# Patient Record
Sex: Female | Born: 1935 | Race: Black or African American | Hispanic: No | Smoking: Never smoker
Health system: Southern US, Community
[De-identification: ages and names within clinical notes are randomized; demographics above are authoritative.]

## PROBLEM LIST (undated history)

## (undated) DIAGNOSIS — M1990 Unspecified osteoarthritis, unspecified site: Secondary | ICD-10-CM

## (undated) DIAGNOSIS — W19XXXA Unspecified fall, initial encounter: Secondary | ICD-10-CM

## (undated) DIAGNOSIS — I10 Essential (primary) hypertension: Secondary | ICD-10-CM

## (undated) DIAGNOSIS — I4819 Other persistent atrial fibrillation: Secondary | ICD-10-CM

## (undated) DIAGNOSIS — I5032 Chronic diastolic (congestive) heart failure: Secondary | ICD-10-CM

## (undated) DIAGNOSIS — R42 Dizziness and giddiness: Secondary | ICD-10-CM

## (undated) DIAGNOSIS — K922 Gastrointestinal hemorrhage, unspecified: Secondary | ICD-10-CM

## (undated) DIAGNOSIS — I739 Peripheral vascular disease, unspecified: Secondary | ICD-10-CM

## (undated) HISTORY — PX: JOINT REPLACEMENT: SHX530

## (undated) HISTORY — PX: TOTAL KNEE ARTHROPLASTY: SHX125

## (undated) HISTORY — DX: Peripheral vascular disease, unspecified: I73.9

## (undated) HISTORY — PX: HERNIA REPAIR: SHX51

## (undated) HISTORY — PX: ABDOMINAL AORTIC ANEURYSM REPAIR: SUR1152

---

## 2004-01-02 ENCOUNTER — Other Ambulatory Visit: Payer: Self-pay

## 2006-11-22 ENCOUNTER — Ambulatory Visit: Payer: Self-pay | Admitting: Gastroenterology

## 2007-03-28 ENCOUNTER — Ambulatory Visit: Payer: Self-pay | Admitting: Unknown Physician Specialty

## 2007-10-05 ENCOUNTER — Ambulatory Visit: Payer: Self-pay | Admitting: *Deleted

## 2008-08-15 ENCOUNTER — Ambulatory Visit: Payer: Self-pay | Admitting: Surgery

## 2008-09-27 ENCOUNTER — Ambulatory Visit: Payer: Self-pay | Admitting: Family Medicine

## 2008-12-14 HISTORY — PX: LAPAROSCOPIC INCISIONAL / UMBILICAL / VENTRAL HERNIA REPAIR: SUR789

## 2009-06-06 ENCOUNTER — Emergency Department: Payer: Self-pay | Admitting: Emergency Medicine

## 2009-07-08 ENCOUNTER — Ambulatory Visit: Payer: Self-pay | Admitting: Gastroenterology

## 2009-09-16 ENCOUNTER — Ambulatory Visit: Payer: Self-pay | Admitting: Surgery

## 2009-09-24 ENCOUNTER — Inpatient Hospital Stay: Payer: Self-pay | Admitting: Surgery

## 2011-09-28 ENCOUNTER — Encounter: Payer: Self-pay | Admitting: Cardiothoracic Surgery

## 2011-09-28 ENCOUNTER — Encounter: Payer: Self-pay | Admitting: Nurse Practitioner

## 2011-10-15 ENCOUNTER — Encounter: Payer: Self-pay | Admitting: Nurse Practitioner

## 2011-10-15 ENCOUNTER — Encounter: Payer: Self-pay | Admitting: Cardiothoracic Surgery

## 2011-10-19 ENCOUNTER — Ambulatory Visit: Payer: Self-pay | Admitting: Vascular Surgery

## 2011-11-14 ENCOUNTER — Encounter: Payer: Self-pay | Admitting: Cardiothoracic Surgery

## 2011-11-14 ENCOUNTER — Encounter: Payer: Self-pay | Admitting: Nurse Practitioner

## 2012-07-25 LAB — URINALYSIS, COMPLETE
Glucose,UR: NEGATIVE mg/dL (ref 0–75)
Ketone: NEGATIVE
Nitrite: NEGATIVE
WBC UR: 4 /HPF (ref 0–5)

## 2012-07-25 LAB — CBC
HCT: 39.8 % (ref 35.0–47.0)
MCH: 32.1 pg (ref 26.0–34.0)
MCHC: 34.9 g/dL (ref 32.0–36.0)
MCV: 92 fL (ref 80–100)
Platelet: 131 10*3/uL — ABNORMAL LOW (ref 150–440)
RDW: 13.7 % (ref 11.5–14.5)
WBC: 12 10*3/uL — ABNORMAL HIGH (ref 3.6–11.0)

## 2012-07-25 LAB — COMPREHENSIVE METABOLIC PANEL
BUN: 16 mg/dL (ref 7–18)
Bilirubin,Total: 0.5 mg/dL (ref 0.2–1.0)
Calcium, Total: 9.4 mg/dL (ref 8.5–10.1)
Co2: 24 mmol/L (ref 21–32)
EGFR (African American): >60
EGFR (Non-African Amer.): 55 — ABNORMAL LOW
Glucose: 158 mg/dL — ABNORMAL HIGH (ref 65–99)
Osmolality: 284 (ref 275–301)
Potassium: 4.1 mmol/L (ref 3.5–5.1)
SGOT(AST): 39 U/L — ABNORMAL HIGH (ref 15–37)
SGPT (ALT): 26 U/L (ref 12–78)
Sodium: 140 mmol/L (ref 136–145)

## 2012-07-25 LAB — LIPASE, BLOOD: Lipase: 95 U/L (ref 73–393)

## 2012-07-26 ENCOUNTER — Inpatient Hospital Stay: Payer: Self-pay | Admitting: Surgery

## 2012-07-27 LAB — CBC WITH DIFFERENTIAL/PLATELET
Basophil #: 0 10*3/uL (ref 0.0–0.1)
Basophil %: 0.5 %
Eosinophil #: 0.4 10*3/uL (ref 0.0–0.7)
Eosinophil %: 4 %
HCT: 36 % (ref 35.0–47.0)
HGB: 12.2 g/dL (ref 12.0–16.0)
Lymphocyte #: 2.4 10*3/uL (ref 1.0–3.6)
Lymphocyte %: 27.7 %
MCH: 31.1 pg (ref 26.0–34.0)
MCHC: 33.8 g/dL (ref 32.0–36.0)
Monocyte %: 8.2 %
Neutrophil %: 59.6 %
RBC: 3.93 10*6/uL (ref 3.80–5.20)
WBC: 8.8 10*3/uL (ref 3.6–11.0)

## 2012-07-27 LAB — BASIC METABOLIC PANEL
Anion Gap: 7 (ref 7–16)
BUN: 10 mg/dL (ref 7–18)
Chloride: 106 mmol/L (ref 98–107)
Co2: 29 mmol/L (ref 21–32)
EGFR (African American): >60
EGFR (Non-African Amer.): >60
Glucose: 104 mg/dL — ABNORMAL HIGH (ref 65–99)
Osmolality: 282 (ref 275–301)
Potassium: 3.6 mmol/L (ref 3.5–5.1)
Sodium: 142 mmol/L (ref 136–145)

## 2012-07-28 LAB — BASIC METABOLIC PANEL
BUN: 7 mg/dL (ref 7–18)
Chloride: 108 mmol/L — ABNORMAL HIGH (ref 98–107)
Co2: 25 mmol/L (ref 21–32)
Creatinine: 0.71 mg/dL (ref 0.60–1.30)
EGFR (Non-African Amer.): >60
Osmolality: 280 (ref 275–301)
Potassium: 3.9 mmol/L (ref 3.5–5.1)

## 2012-07-28 LAB — CBC WITH DIFFERENTIAL/PLATELET
Basophil %: 0.6 %
Eosinophil #: 0.3 10*3/uL (ref 0.0–0.7)
Eosinophil %: 3.7 %
HCT: 36.8 % (ref 35.0–47.0)
HGB: 12.7 g/dL (ref 12.0–16.0)
Lymphocyte %: 20.9 %
MCH: 32.2 pg (ref 26.0–34.0)
MCHC: 34.6 g/dL (ref 32.0–36.0)
MCV: 93 fL (ref 80–100)
Monocyte #: 0.6 x10 3/mm (ref 0.2–0.9)
Monocyte %: 8 %
Platelet: 63 10*3/uL — ABNORMAL LOW (ref 150–440)
RBC: 3.95 10*6/uL (ref 3.80–5.20)
RDW: 13.8 % (ref 11.5–14.5)
WBC: 7.6 10*3/uL (ref 3.6–11.0)

## 2012-10-26 ENCOUNTER — Ambulatory Visit: Payer: Self-pay | Admitting: Family Medicine

## 2014-10-16 ENCOUNTER — Inpatient Hospital Stay: Payer: Self-pay | Admitting: Surgery

## 2014-10-16 LAB — CBC
HCT: 42.6 % (ref 35.0–47.0)
HGB: 14.3 g/dL (ref 12.0–16.0)
MCH: 31.7 pg (ref 26.0–34.0)
MCHC: 33.6 g/dL (ref 32.0–36.0)
MCV: 94 fL (ref 80–100)
PLATELETS: 131 10*3/uL — AB (ref 150–440)
RBC: 4.52 10*6/uL (ref 3.80–5.20)
RDW: 13.6 % (ref 11.5–14.5)
WBC: 12.9 10*3/uL — AB (ref 3.6–11.0)

## 2014-10-16 LAB — COMPREHENSIVE METABOLIC PANEL
ALBUMIN: 3.5 g/dL (ref 3.4–5.0)
ALK PHOS: 78 U/L
AST: 27 U/L (ref 15–37)
Anion Gap: 9 (ref 7–16)
BUN: 18 mg/dL (ref 7–18)
Bilirubin,Total: 1.5 mg/dL — ABNORMAL HIGH (ref 0.2–1.0)
CREATININE: 1 mg/dL (ref 0.60–1.30)
Calcium, Total: 9 mg/dL (ref 8.5–10.1)
Chloride: 103 mmol/L (ref 98–107)
Co2: 30 mmol/L (ref 21–32)
EGFR (Non-African Amer.): 57 — ABNORMAL LOW
Glucose: 114 mg/dL — ABNORMAL HIGH (ref 65–99)
OSMOLALITY: 286 (ref 275–301)
POTASSIUM: 3.6 mmol/L (ref 3.5–5.1)
SGPT (ALT): 21 U/L
Sodium: 142 mmol/L (ref 136–145)
TOTAL PROTEIN: 6.7 g/dL (ref 6.4–8.2)

## 2014-10-16 LAB — URINALYSIS, COMPLETE
Bilirubin,UR: NEGATIVE
GLUCOSE, UR: NEGATIVE mg/dL (ref 0–75)
Leukocyte Esterase: NEGATIVE
NITRITE: NEGATIVE
Ph: 5 (ref 4.5–8.0)
Protein: 30
SPECIFIC GRAVITY: 1.025 (ref 1.003–1.030)

## 2014-10-17 LAB — CBC WITH DIFFERENTIAL/PLATELET
BASOS PCT: 0.7 %
Basophil #: 0.1 10*3/uL (ref 0.0–0.1)
Eosinophil #: 0.3 10*3/uL (ref 0.0–0.7)
Eosinophil %: 4 %
HCT: 36.3 % (ref 35.0–47.0)
HGB: 12.5 g/dL (ref 12.0–16.0)
Lymphocyte #: 2 10*3/uL (ref 1.0–3.6)
Lymphocyte %: 23.5 %
MCH: 32.3 pg (ref 26.0–34.0)
MCHC: 34.4 g/dL (ref 32.0–36.0)
MCV: 94 fL (ref 80–100)
MONO ABS: 0.7 x10 3/mm (ref 0.2–0.9)
Monocyte %: 8.4 %
NEUTROS ABS: 5.4 10*3/uL (ref 1.4–6.5)
NEUTROS PCT: 63.4 %
Platelet: 113 10*3/uL — ABNORMAL LOW (ref 150–440)
RBC: 3.87 10*6/uL (ref 3.80–5.20)
RDW: 13.8 % (ref 11.5–14.5)
WBC: 8.5 10*3/uL (ref 3.6–11.0)

## 2014-10-17 LAB — BASIC METABOLIC PANEL
Anion Gap: 7 (ref 7–16)
BUN: 16 mg/dL (ref 7–18)
CHLORIDE: 104 mmol/L (ref 98–107)
CO2: 28 mmol/L (ref 21–32)
Calcium, Total: 8 mg/dL — ABNORMAL LOW (ref 8.5–10.1)
Creatinine: 0.77 mg/dL (ref 0.60–1.30)
EGFR (Non-African Amer.): >60
Glucose: 99 mg/dL (ref 65–99)
OSMOLALITY: 279 (ref 275–301)
Potassium: 3.4 mmol/L — ABNORMAL LOW (ref 3.5–5.1)
SODIUM: 139 mmol/L (ref 136–145)

## 2015-04-02 NOTE — H&P (Signed)
PATIENT NAME:  [name], [name] MR#:  [id number] DATE OF BIRTH:  11/13/1931  DATE OF ADMISSION:  07/26/2012  CHIEF COMPLAINT: Abdominal pain.   HISTORY OF PRESENT ILLNESS: This is a patient with approximately 12 to 18 hours of abdominal pain. She points to the periumbilical area stating that it has not worsened but caused her to have nausea and multiple emeses earlier today. She had a normal bowel movement yesterday morning on the 12th, has passed no gas since, however. She has never had an episode like this before. Of note, she has a large recurrent ventral hernia which is reducible and soft but that that is where she points to her pain.   PAST MEDICAL HISTORY: Hypertension.   PAST SURGICAL HISTORY:  1. Knee replacement. 2. Ventral hernia repair. 3. Recurrent ventral hernia with mesh using laparoscopic approach by Dr. Michela PitcherEly in 2010. When asked about why she had the ventral hernia she states she never had surgery before that and thought that it was just due to weakness.  4. In the chart nursing information states she had had an abdominal aortic aneurysm repair. I see no evidence of that and the patient does not mention that in her history nor does she have a long midline incision to suggest that.   ALLERGIES: Sulfa drugs.   MEDICATIONS:  1. Triamterene. 2. Hydrochlorothiazide. 3. Potassium chloride. 4. Labetalol. 5. Amlodipine.  FAMILY HISTORY: Noncontributory.   SOCIAL HISTORY: She does not smoke or drink. Lives at home.   REVIEW OF SYSTEMS: 10 system review is performed and negative with the exception of that mentioned in the history of present illness.   PHYSICAL EXAMINATION:  GENERAL: Healthy, comfortable-appearing black female patient.   VITAL SIGNS: Temperature 97.7, pulse 87, respirations 18, blood pressure 184/78, 96% room air sat, pain scale 6.   HEENT: No scleral icterus.   NECK: No palpable neck nodes.   CHEST: Clear to auscultation.   CARDIAC: Regular rate and rhythm.    ABDOMEN: Soft, nondistended. There is a ventral hernia which is visible and palpable and easily reducible. It is only minimally tender but nearly completely reducible with lots of bowel sounds during the reduction. Multiple laparoscopy scars are noted and an infraumbilical scar is noted.   EXTREMITIES: Without edema. Calves are nontender.   NEUROLOGIC: Grossly intact.   INTEGUMENT: No jaundice.   LABORATORY, DIAGNOSTIC, AND RADIOLOGICAL DATA: CT scan and KUB are personally reviewed. Large recurrent ventral hernia. Multiple tacks present from prior laparoscopic repair. Recurrent hernia has a large amount of bowel within it. There is contrast proximal, none distal, but there is lots of colon gas present with extensive stool.   White blood cell count 12.0, hemoglobin and hematocrit 13.9 and 39.8, platelet count 131. Urinalysis shows 1+ leukocyte esterase and electrolytes are within normal limits. Potassium 4.1.   ASSESSMENT AND PLAN: This is a patient with a partial small bowel obstruction and a large recurrent ventral hernia. I have recommended admission with hydration and NG decompression, re-examination. While this hernia is soft and nontender or only minimally tender, it is mostly reducible and I am not clear that that is the site of her bowel obstruction, however, admission will need to be performed and decompression and re-examination to determine if surgery is indicated if she does not progress. This was discussed with she and her family. They understood and agreed to proceed.   ____________________________ Adah Salvageichard E. Excell Seltzerooper, MD rec:cms D: 07/26/2012 02:36:03 ET T: 07/26/2012 10:00:50 ET JOB#: [id number]  cc: Gerlene Burdockichard  Kerby Nora, MD, <Dictator>  Lattie Haw MD ELECTRONICALLY SIGNED 07/26/2012 19:10

## 2015-04-02 NOTE — Discharge Summary (Signed)
PATIENT NAME:  [name], [name] MR#:  [id number] DATE OF BIRTH:  06/27/1931  DATE OF ADMISSION:  07/26/2012 DATE OF DISCHARGE:  07/29/2012  DIAGNOSES:  1. Recurrent ventral hernia. 2. Small bowel obstruction.  3. Hypertension.   PROCEDURES: None.   HISTORY OF PRESENT ILLNESS/HOSPITAL COURSE: This is a patient who has had several hours of abdominal pain at the time of admission. Work-up had shown a recurrent ventral hernia which was partially reducible in the Emergency Room. She had had some nausea and vomiting and did not pass any gas following a normal bowel movement the day before admission with a diagnosis of probable partial small bowel obstruction possibly related to a recurrent ventral hernia. She was admitted to the hospital. Nasogastric tube was placed. She was resuscitated with IV fluids and her nausea was controlled with antiemetics. While in the hospital her condition improved. Serial x-rays showed passage of gas through to her colon. She started passing gas per rectum and the nasogastric tube was removed. She tolerated a regular diet. Was discharged in stable condition to follow up in our office where Dr. Michela PitcherEly could see the patient. He had performed a recurrent ventral hernia repair in the past laparoscopically and it was likely that she wouldn't require another procedure for that. She understood and agreed with this plan. She was instructed to resume her regular medications. No new medications were sent with her.  ____________________________ Adah Salvageichard E. Excell Seltzerooper, MD rec:cms D: 08/06/2012 19:44:10 ET T: 08/07/2012 12:48:38 ET JOB#: [id number]  cc: Adah Salvageichard E. Excell Seltzerooper, MD, <Dictator> Lattie HawICHARD E Vann Okerlund MD ELECTRONICALLY SIGNED 08/07/2012 19:40

## 2015-04-02 NOTE — H&P (Signed)
Subjective/Chief Complaint abd pain    History of Present Illness 18 hours of abd pain, not worsening. nausea and mult emesis, nml BM this am no flatus since. no prior episode    Past History PMH HTN PSH VH, Rec VH laparascopic with mesh knee repl    Past Medical Health Hypertension   Past Med/Surgical Hx:  htn:   AAA - Abdominal Aortic Aneurysm Repair:   l knee replacement:   hernia repair:   ALLERGIES:  Sulfa drugs: Unknown  Family and Social History:   Family History Non-Contributory    Social History negative tobacco, negative ETOH    Place of Living Home   Review of Systems:   Fever/Chills No    Cough No    Abdominal Pain Yes    Diarrhea No    Constipation Yes    Nausea/Vomiting Yes    SOB/DOE No    Chest Pain No    Dysuria No    Tolerating Diet No  Nauseated  Vomiting   Physical Exam:   GEN no acute distress    HEENT pink conjunctivae    NECK supple    RESP normal resp effort  clear BS  no use of accessory muscles    CARD regular rate    ABD positive tenderness  positive hernia  soft  min tenderness, reducible rec VH, mult scars no peritoneal signs    LYMPH negative neck    EXTR negative edema    SKIN normal to palpation    PSYCH alert, A+O to time, place, person, good insight   Lab Results: Hepatic:  12-Aug-13 20:08    Bilirubin, Total 0.5   Alkaline Phosphatase 115   SGPT (ALT) 26   SGOT (AST)  39   Total Protein, Serum 7.3   Albumin, Serum 3.8  Routine Chem:  12-Aug-13 20:08    Lipase 95 (Result(s) reported on 25 Jul 2012 at 08:45PM.)   Glucose, Serum  158   BUN 16   Creatinine (comp) 0.99   Sodium, Serum 140   Potassium, Serum 4.1   Chloride, Serum 106   CO2, Serum 24   Calcium (Total), Serum 9.4   Osmolality (calc) 284   eGFR (African American) >60   eGFR (Non-African American)  55 (eGFR values <36m/min/1.73 m2 may be an indication of chronic kidney disease (CKD). Calculated eGFR is useful in patients with  stable renal function. The eGFR calculation will not be reliable in acutely ill patients when serum creatinine is changing rapidly. It is not useful in  patients on dialysis. The eGFR calculation may not be applicable to patients at the low and high extremes of body sizes, pregnant women, and vegetarians.)   Anion Gap 10  Routine UA:  12-Aug-13 20:08    Color (UA) Yellow   Clarity (UA) Cloudy   Glucose (UA) Negative   Bilirubin (UA) Negative   Ketones (UA) Negative   Specific Gravity (UA) 1.014   Blood (UA) Negative   pH (UA) 8.0   Protein (UA) Negative   Nitrite (UA) Negative   Leukocyte Esterase (UA) 1+ (Result(s) reported on 25 Jul 2012 at 08:48PM.)   RBC (UA) 4 /HPF   WBC (UA) 4 /HPF   Bacteria (UA) TRACE   Epithelial Cells (UA) <1 /HPF   Amorphous Crystal (UA) PRESENT (Result(s) reported on 25 Jul 2012 at 08:48PM.)  Routine Hem:  12-Aug-13 20:08    WBC (CBC)  12.0   RBC (CBC) 4.33   Hemoglobin (CBC) 13.9  Hematocrit (CBC) 39.8   Platelet Count (CBC)  131 (Result(s) reported on 25 Jul 2012 at 08:29PM.)   MCV 92   MCH 32.1   MCHC 34.9   RDW 13.7     Assessment/Admission Diagnosis sbo with constipation and large but reducible rec VH rec admit, hydrate NG decompression and reexamination   Electronic Signatures: Florene Glen (MD)  (Signed 13-Aug-13 02:30)  Authored: CHIEF COMPLAINT and HISTORY, PAST MEDICAL/SURGIAL HISTORY, ALLERGIES, FAMILY AND SOCIAL HISTORY, REVIEW OF SYSTEMS, PHYSICAL EXAM, LABS, ASSESSMENT AND PLAN   Last Updated: 13-Aug-13 02:30 by Florene Glen (MD)

## 2015-07-13 ENCOUNTER — Emergency Department: Payer: Medicare Other

## 2015-07-13 ENCOUNTER — Emergency Department
Admission: EM | Admit: 2015-07-13 | Discharge: 2015-07-13 | Disposition: A | Discharge status: Home / Self Care | Payer: Medicare Other | Attending: Emergency Medicine | Admitting: Emergency Medicine

## 2015-07-13 ENCOUNTER — Encounter: Payer: Self-pay | Admitting: Emergency Medicine

## 2015-07-13 DIAGNOSIS — I10 Essential (primary) hypertension: Secondary | ICD-10-CM | POA: Insufficient documentation

## 2015-07-13 DIAGNOSIS — K429 Umbilical hernia without obstruction or gangrene: Secondary | ICD-10-CM | POA: Insufficient documentation

## 2015-07-13 DIAGNOSIS — K432 Incisional hernia without obstruction or gangrene: Secondary | ICD-10-CM | POA: Diagnosis not present

## 2015-07-13 DIAGNOSIS — R1011 Right upper quadrant pain: Secondary | ICD-10-CM | POA: Diagnosis present

## 2015-07-13 HISTORY — DX: Essential (primary) hypertension: I10

## 2015-07-13 LAB — COMPREHENSIVE METABOLIC PANEL
ALT: 18 U/L (ref 14–54)
ANION GAP: 8 (ref 5–15)
AST: 25 U/L (ref 15–41)
Albumin: 3.6 g/dL (ref 3.5–5.0)
Alkaline Phosphatase: 86 U/L (ref 38–126)
BILIRUBIN TOTAL: 0.8 mg/dL (ref 0.3–1.2)
BUN: 17 mg/dL (ref 6–20)
CHLORIDE: 106 mmol/L (ref 101–111)
CO2: 25 mmol/L (ref 22–32)
Calcium: 9.2 mg/dL (ref 8.9–10.3)
Creatinine, Ser: 0.78 mg/dL (ref 0.44–1.00)
Glucose, Bld: 85 mg/dL (ref 65–99)
POTASSIUM: 3.7 mmol/L (ref 3.5–5.1)
Sodium: 139 mmol/L (ref 135–145)
Total Protein: 6 g/dL — ABNORMAL LOW (ref 6.5–8.1)

## 2015-07-13 LAB — CBC WITH DIFFERENTIAL/PLATELET
BASOS PCT: 1 %
Basophils Absolute: 0.1 10*3/uL (ref 0–0.1)
Eosinophils Absolute: 0.5 10*3/uL (ref 0–0.7)
Eosinophils Relative: 6 %
HCT: 36.8 % (ref 35.0–47.0)
Hemoglobin: 12.7 g/dL (ref 12.0–16.0)
Lymphocytes Relative: 19 %
Lymphs Abs: 1.5 10*3/uL (ref 1.0–3.6)
MCH: 31.8 pg (ref 26.0–34.0)
MCHC: 34.4 g/dL (ref 32.0–36.0)
MCV: 92.6 fL (ref 80.0–100.0)
MONO ABS: 0.6 10*3/uL (ref 0.2–0.9)
Monocytes Relative: 8 %
NEUTROS PCT: 66 %
Neutro Abs: 4.9 10*3/uL (ref 1.4–6.5)
Platelets: 105 10*3/uL — ABNORMAL LOW (ref 150–440)
RBC: 3.98 MIL/uL (ref 3.80–5.20)
RDW: 13.7 % (ref 11.5–14.5)
WBC: 7.6 10*3/uL (ref 3.6–11.0)

## 2015-07-13 LAB — URINALYSIS COMPLETE WITH MICROSCOPIC (ARMC ONLY)
Bacteria, UA: NONE SEEN
Bilirubin Urine: NEGATIVE
Glucose, UA: NEGATIVE mg/dL
Hgb urine dipstick: NEGATIVE
KETONES UR: NEGATIVE mg/dL
Leukocytes, UA: NEGATIVE
NITRITE: NEGATIVE
PH: 7 (ref 5.0–8.0)
Protein, ur: NEGATIVE mg/dL
RBC / HPF: NONE SEEN RBC/hpf (ref 0–5)
Specific Gravity, Urine: 1.009 (ref 1.005–1.030)

## 2015-07-13 LAB — LIPASE, BLOOD: Lipase: 23 U/L (ref 22–51)

## 2015-07-13 LAB — TROPONIN I

## 2015-07-13 MED ORDER — IOHEXOL 300 MG/ML  SOLN
100.0000 mL | Freq: Once | INTRAMUSCULAR | Status: AC | PRN
  Administered 2015-07-13: 100 mL via INTRAVENOUS

## 2015-07-13 MED ORDER — OXYCODONE-ACETAMINOPHEN 5-325 MG PO TABS
2.0000 | ORAL_TABLET | Freq: Once | ORAL | Status: AC
  Administered 2015-07-13: 2 via ORAL
  Filled 2015-07-13: qty 2

## 2015-07-13 MED ORDER — OXYCODONE-ACETAMINOPHEN 5-325 MG PO TABS: 1.0000 | ORAL_TABLET | Freq: Four times a day (QID) | ORAL | Status: DC | PRN

## 2015-07-13 MED ORDER — ONDANSETRON HCL 4 MG/2ML IJ SOLN
4.0000 mg | Freq: Once | INTRAMUSCULAR | Status: AC
  Administered 2015-07-13: 4 mg via INTRAVENOUS
  Filled 2015-07-13: qty 2

## 2015-07-13 MED ORDER — ONDANSETRON 4 MG PO TBDP: 4.0000 mg | ORAL_TABLET | Freq: Three times a day (TID) | ORAL | Status: DC | PRN

## 2015-07-13 MED ORDER — IOHEXOL 240 MG/ML SOLN
25.0000 mL | Freq: Once | INTRAMUSCULAR | Status: AC | PRN
  Administered 2015-07-13: 25 mL via INTRAVENOUS

## 2015-07-13 MED ORDER — MORPHINE SULFATE 4 MG/ML IJ SOLN
4.0000 mg | Freq: Once | INTRAMUSCULAR | Status: AC
  Administered 2015-07-13: 4 mg via INTRAVENOUS
  Filled 2015-07-13: qty 1

## 2015-07-13 MED ORDER — RANITIDINE HCL 150 MG PO TABS: 150.0000 mg | ORAL_TABLET | Freq: Every day | ORAL | Status: DC

## 2015-07-13 NOTE — Consult Note (Signed)
CC: Periumbilical pain x 1 day post emesis  HPI: [name] is a pleasant 81 yo F with a history of multiple ventral hernia repairs and recurrent hernia who presents with periumbilical pain and emesis.  Emesis began 1 day ago and pain began after.  Currently uncomfortable but without any emesis.  Normal BM yesterday but requires prn miralax to have bm.  No sick contacts, no unusual ingestions.  Was admitted 3 years ago with SBO.  No fevers/chills, night sweats, shortness of breath, cough, chest pain, dysuria/hematuria.  Active Ambulatory Problems    Diagnosis Date Noted  . No Active Ambulatory Problems   Resolved Ambulatory Problems    Diagnosis Date Noted  . No Resolved Ambulatory Problems   Past Medical History  Diagnosis Date  . Hypertension    Past Surgical History  Procedure Laterality Date  . Hernia repair    . Joint replacement      left knee     Medication List    TAKE these medications        ondansetron 4 MG disintegrating tablet  Commonly known as:  ZOFRAN ODT  Take 1 tablet (4 mg total) by mouth every 8 (eight) hours as needed for nausea or vomiting.     oxyCODONE-acetaminophen 5-325 MG per tablet  Commonly known as:  ROXICET  Take 1 tablet by mouth every 6 (six) hours as needed.     ranitidine 150 MG tablet  Commonly known as:  ZANTAC  Take 1 tablet (150 mg total) by mouth at bedtime.       Allergies  Allergen Reactions  . Sulfa Antibiotics Shortness Of Breath   History   Social History  . Marital Status: Divorced    Spouse Name: N/A  . Number of Children: N/A  . Years of Education: N/A   Occupational History  . Not on file.   Social History Main Topics  . Smoking status: Never Smoker   . Smokeless tobacco: Not on file  . Alcohol Use: No  . Drug Use: Not on file  . Sexual Activity: Not on file   Other Topics Concern  . Not on file   Social History Narrative  . No narrative on file   No family history on file.   ROS: Full ROS  obtained, pertinent positives and negatives per HPI  Blood pressure 136/64, pulse 68, temperature 97.8 F (36.6 C), temperature source Oral, resp. rate 18, height  (1.676 m), weight 80.74 kg (178 lb), SpO2 97 %. GEN: NAD/A&Ox3 FACE: no obvious facial trauma, normal external nose, normal external ears EYES: no scleral icterus, no conjunctivitis HEAD: normocephalic atraumatic CV: RRR, no MRG RESP: moving air well, lungs clear ABD: soft, min tender, nondistended EXT: moving all ext well, strength 5/5 NEURO: cnII-XII grossly intact, sensation intact all 4 ext  Labs: Personally reviewed,  Significant for  WBC 7.6 (66%N) BUN 17, Cr .78, Bicarb 25  CT: Personally reviewed, significant for - large periumbilical hernia containing small bowel, no evidence of obstruction or strangulation  [name]  81 yo with recurrent ventral hernia, which she has had for years, who presents with pain and resolved emesis.  Suspect that pain may be due to emesis.  Normal VS, nl WBC, no lab findings of dehydration and normal CT scan.  No acute surgical issues.  Patient may follow up with me or Dr. Michela Pitcher in outpatient clinic to ensure resolution of symptoms.

## 2015-07-13 NOTE — ED Notes (Signed)
Pt states yesterday she had n,v, then immediately after, began having right upper quad pain. Pt states it hurts too bad to sit down at this time.

## 2015-07-13 NOTE — ED Notes (Signed)
Dr. [name] at bedside to discuss with patient results of CT.

## 2015-07-13 NOTE — ED Notes (Signed)
4 attempts for IV access by 2 RN's. Attempts unsuccessful. Pt tolerated well.

## 2015-07-13 NOTE — ED Notes (Signed)
Pt assisted with ambulation to the bathroom. Pt tolerated ambulation but states pain with movement.

## 2015-07-13 NOTE — ED Provider Notes (Signed)
[facility] Emergency Department Provider Note     Time seen: ----------------------------------------- 10:51 AM on 07/13/2015 -----------------------------------------    I have reviewed the triage vital signs and the nursing notes.   HISTORY  Chief Complaint Abdominal Pain    HPI [name] is a 81 y.o. female who presents ER with nausea vomiting and then immediately aftershe began having right upper quadrant and right periumbilical pain. Patient states it hurts to sit down this time, she is not currently nauseated and still has soreness in the periumbilical area. She's had a hernia there and is had operated on at least twice.   Past Medical History  Diagnosis Date  . Hypertension     There are no active problems to display for this patient.   Past Surgical History  Procedure Laterality Date  . Hernia repair    . Joint replacement      left knee    Allergies Sulfa antibiotics  Social History History  Substance Use Topics  . Smoking status: Never Smoker   . Smokeless tobacco: Not on file  . Alcohol Use: No    Review of Systems Constitutional: Negative for fever. Eyes: Negative for visual changes. ENT: Negative for sore throat. Cardiovascular: Negative for chest pain. Respiratory: Negative for shortness of breath. Gastrointestinal: As if her abdominal pain and vomiting Genitourinary: Negative for dysuria. Musculoskeletal: Negative for back pain. Skin: Negative for rash. Neurological: Negative for headaches, focal weakness or numbness.  10-point ROS otherwise negative.  ____________________________________________   PHYSICAL EXAM:  VITAL SIGNS: ED Triage Vitals  Enc Vitals Group     BP 07/13/15 1035 151/82 mmHg     Pulse Rate 07/13/15 1035 96     Resp 07/13/15 1035 18     Temp 07/13/15 1035 97.8 F (36.6 C)     Temp Source 07/13/15 1035 Oral     SpO2 07/13/15 1035 96 %     Weight 07/13/15 1035 178 lb (80.74 kg)      Height 07/13/15 1035 5\' 6"  (1.676 m)     Head Cir --      Peak Flow --      Pain Score 07/13/15 1036 8     Pain Loc --      Pain Edu? --      Excl. in GC? --     Constitutional: Alert and oriented. Well appearing and in no distress. Eyes: Conjunctivae are normal. PERRL. Normal extraocular movements. ENT   Head: Normocephalic and atraumatic.   Nose: No congestion/rhinnorhea.   Mouth/Throat: Mucous membranes are moist.   Neck: No stridor. Cardiovascular: Normal rate, regular rhythm. Normal and symmetric distal pulses are present in all extremities. No murmurs, rubs, or gallops. Respiratory: Normal respiratory effort without tachypnea nor retractions. Breath sounds are clear and equal bilaterally. No wheezes/rales/rhonchi. Gastrointestinal: Soft with right upper quadrant and right periumbilical tenderness. There is a palpable mass likely periumbilical herniation. Musculoskeletal: Nontender with normal range of motion in all extremities. No joint effusions.  No lower extremity tenderness nor edema. Neurologic:  Normal speech and language. No gross focal neurologic deficits are appreciated. Speech is normal. No gait instability. Skin:  Skin is warm, dry and intact. No rash noted. Psychiatric: Mood and affect are normal. Speech and behavior are normal. Patient exhibits appropriate insight and judgment. ____________________________________________  ED COURSE:  Pertinent labs & imaging results that were available during my care of the patient were reviewed by me and considered in my medical decision making (see chart  for details). Patient will need CT imaging of the abdomen. She has a palpable hernia with possible mass in the periumbilical area ____________________________________________    LABS (pertinent positives/negatives)  Labs Reviewed  CBC WITH DIFFERENTIAL/PLATELET - Abnormal; Notable for the following:    Platelets 105 (*)    All other components within normal  limits  COMPREHENSIVE METABOLIC PANEL - Abnormal; Notable for the following:    Total Protein 6.0 (*)    All other components within normal limits  URINALYSIS COMPLETEWITH MICROSCOPIC ([facility] ONLY) - Abnormal; Notable for the following:    Color, Urine YELLOW (*)    APPearance CLEAR (*)    Squamous Epithelial / LPF 0-5 (*)    All other components within normal limits  LIPASE, BLOOD  TROPONIN I    RADIOLOGY Images were viewed by me  CT abdomen and pelvis with contrast  IMPRESSION: Large paraumbilical hernia containing numerous small bowel loops. Small amount of fluid and mesenteric edema within the hernia shows no significant change compared to previous exam. No evidence of bowel obstruction.  Colonic diverticulosis. No radiographic evidence of diverticulitis.  Hepatic cirrhosis. No radiographic evidence hepatic neoplasm, splenomegaly, or ascites.  ____________________________________________  FINAL ASSESSMENT AND PLAN  Abdominal pain, vomiting, periumbilical hernia    Plan: Patient with labs and imaging as dictated above. Patient with a chronic ventral periumbilical hernia, which does not show any change from prior. Labs are grossly unremarkable. She is stable for follow-up with her doctor with pain medicine or antiemetics as needed.   Emily Filbert, MD   Emily Filbert, MD 07/13/15 [phone]

## 2015-07-13 NOTE — Discharge Instructions (Signed)

## 2015-07-13 NOTE — ED Notes (Signed)
Pt's daughter requesting to see EDP.

## 2015-07-15 ENCOUNTER — Emergency Department: Payer: Medicare Other

## 2015-07-15 ENCOUNTER — Other Ambulatory Visit: Payer: Self-pay

## 2015-07-15 ENCOUNTER — Emergency Department
Admission: EM | Admit: 2015-07-15 | Discharge: 2015-07-15 | Disposition: A | Discharge status: Home / Self Care | Payer: Medicare Other | Attending: Student | Admitting: Student

## 2015-07-15 ENCOUNTER — Encounter: Payer: Self-pay | Admitting: Emergency Medicine

## 2015-07-15 DIAGNOSIS — R1011 Right upper quadrant pain: Secondary | ICD-10-CM | POA: Diagnosis present

## 2015-07-15 DIAGNOSIS — Z79899 Other long term (current) drug therapy: Secondary | ICD-10-CM | POA: Insufficient documentation

## 2015-07-15 DIAGNOSIS — K469 Unspecified abdominal hernia without obstruction or gangrene: Secondary | ICD-10-CM | POA: Insufficient documentation

## 2015-07-15 DIAGNOSIS — K5900 Constipation, unspecified: Secondary | ICD-10-CM | POA: Diagnosis not present

## 2015-07-15 DIAGNOSIS — I10 Essential (primary) hypertension: Secondary | ICD-10-CM | POA: Insufficient documentation

## 2015-07-15 LAB — COMPREHENSIVE METABOLIC PANEL
ALT: 16 U/L (ref 14–54)
AST: 24 U/L (ref 15–41)
Albumin: 4.1 g/dL (ref 3.5–5.0)
Alkaline Phosphatase: 89 U/L (ref 38–126)
Anion gap: 8 (ref 5–15)
BILIRUBIN TOTAL: 0.9 mg/dL (ref 0.3–1.2)
BUN: 16 mg/dL (ref 6–20)
CO2: 30 mmol/L (ref 22–32)
Calcium: 9.7 mg/dL (ref 8.9–10.3)
Chloride: 102 mmol/L (ref 101–111)
Creatinine, Ser: 0.93 mg/dL (ref 0.44–1.00)
GFR calc Af Amer: >60 mL/min (ref >60)
GFR calc non Af Amer: 56 mL/min — ABNORMAL LOW (ref >60)
Glucose, Bld: 112 mg/dL — ABNORMAL HIGH (ref 65–99)
Potassium: 4.5 mmol/L (ref 3.5–5.1)
SODIUM: 140 mmol/L (ref 135–145)
Total Protein: 6.8 g/dL (ref 6.5–8.1)

## 2015-07-15 LAB — URINALYSIS COMPLETE WITH MICROSCOPIC (ARMC ONLY)
Bilirubin Urine: NEGATIVE
Glucose, UA: NEGATIVE mg/dL
Hgb urine dipstick: NEGATIVE
Ketones, ur: NEGATIVE mg/dL
Nitrite: NEGATIVE
PROTEIN: NEGATIVE mg/dL
SPECIFIC GRAVITY, URINE: 1.016 (ref 1.005–1.030)
pH: 7 (ref 5.0–8.0)

## 2015-07-15 LAB — CBC
HEMATOCRIT: 40.1 % (ref 35.0–47.0)
Hemoglobin: 13.8 g/dL (ref 12.0–16.0)
MCH: 32 pg (ref 26.0–34.0)
MCHC: 34.4 g/dL (ref 32.0–36.0)
MCV: 92.9 fL (ref 80.0–100.0)
Platelets: 114 10*3/uL — ABNORMAL LOW (ref 150–440)
RBC: 4.31 MIL/uL (ref 3.80–5.20)
RDW: 13.6 % (ref 11.5–14.5)
WBC: 9.2 10*3/uL (ref 3.6–11.0)

## 2015-07-15 LAB — LIPASE, BLOOD: Lipase: 14 U/L — ABNORMAL LOW (ref 22–51)

## 2015-07-15 MED ORDER — IOHEXOL 300 MG/ML  SOLN
100.0000 mL | Freq: Once | INTRAMUSCULAR | Status: AC | PRN
  Administered 2015-07-15: 100 mL via INTRAVENOUS

## 2015-07-15 MED ORDER — IOHEXOL 240 MG/ML SOLN
25.0000 mL | Freq: Once | INTRAMUSCULAR | Status: AC | PRN
  Administered 2015-07-15: 25 mL via ORAL

## 2015-07-15 MED ORDER — MORPHINE SULFATE 4 MG/ML IJ SOLN
4.0000 mg | Freq: Once | INTRAMUSCULAR | Status: AC
  Administered 2015-07-15: 4 mg via INTRAVENOUS
  Filled 2015-07-15: qty 1

## 2015-07-15 MED ORDER — ONDANSETRON HCL 4 MG/2ML IJ SOLN
4.0000 mg | Freq: Once | INTRAMUSCULAR | Status: AC
  Administered 2015-07-15: 4 mg via INTRAVENOUS

## 2015-07-15 MED ORDER — ONDANSETRON HCL 4 MG/2ML IJ SOLN
INTRAMUSCULAR | Status: AC
  Filled 2015-07-15: qty 2

## 2015-07-15 MED ORDER — KETOROLAC TROMETHAMINE 30 MG/ML IJ SOLN
30.0000 mg | Freq: Once | INTRAMUSCULAR | Status: AC
  Administered 2015-07-15: 30 mg via INTRAVENOUS
  Filled 2015-07-15: qty 1

## 2015-07-15 MED ORDER — GI COCKTAIL ~~LOC~~
30.0000 mL | Freq: Once | ORAL | Status: AC
  Administered 2015-07-15: 30 mL via ORAL
  Filled 2015-07-15: qty 30

## 2015-07-15 NOTE — ED Notes (Signed)
Pt just returned to room from x-ray

## 2015-07-15 NOTE — ED Provider Notes (Signed)
[facility] Emergency Department Provider Note  ____________________________________________  Time seen: Approximately 7:52 AM  I have reviewed the triage vital signs and the nursing notes.   HISTORY  Chief Complaint Abdominal Pain    HPI [name] is a 81 y.o. female with history of hypertension and hernia repair who presents for evaluation of continued right upper quadrant abdominal pain, sudden onset 3 days ago, constant since onset. Patient reports that the days ago she suddenly had onset of nonbloody nonbilious emesis followed by right upper quadrant abdominal pain. She reports that she has not vomited for the past 2 days but her pain persists. She is unable to describe the nature of the pain stating it just "hurts" and is worse with movement and palpation. No fevers, and no diarrhea. She continues to have bowel movements but has chronic constipation. Current severity of symptoms is moderate. She was seen in this emergency department on 07/13/2015 with CT of the abdomen and pelvis showing large periumbilical hernia with numerous small bowel loops and a small amount of fluid with mesenteric edema which was unchanged from prior. She was seen by Dr. [name] of general surgery who attributed her pain to her vomiting. She was discharged with Percocet and hasn't taken his however reports that her pain persists. No chest pain or difficulty breathing.    Past Medical History  Diagnosis Date  . Hypertension     There are no active problems to display for this patient.   Past Surgical History  Procedure Laterality Date  . Hernia repair    . Joint replacement      left knee    Current Outpatient Rx  Name  Route  Sig  Dispense  Refill  . amLODipine (NORVASC) 10 MG tablet   Oral   Take 1 tablet by mouth daily.      5   . labetalol (NORMODYNE) 100 MG tablet   Oral   Take 1 tablet by mouth 2 (two) times daily.      5   . oxyCODONE-acetaminophen  (ROXICET) 5-325 MG per tablet   Oral   Take 1 tablet by mouth every 6 (six) hours as needed.   20 tablet   0   . potassium chloride (K-DUR) 10 MEQ tablet   Oral   Take 1 tablet by mouth daily.      1   . triamterene-hydrochlorothiazide (MAXZIDE-25) 37.5-25 MG per tablet   Oral   Take 1 tablet by mouth daily.      5   . ondansetron (ZOFRAN ODT) 4 MG disintegrating tablet   Oral   Take 1 tablet (4 mg total) by mouth every 8 (eight) hours as needed for nausea or vomiting.   20 tablet   0   . ranitidine (ZANTAC) 150 MG tablet   Oral   Take 1 tablet (150 mg total) by mouth at bedtime.   30 tablet   1     Allergies Sulfa antibiotics  No family history on file.  Social History History  Substance Use Topics  . Smoking status: Never Smoker   . Smokeless tobacco: Not on file  . Alcohol Use: No    Review of Systems Constitutional: No fever/chills Eyes: No visual changes. ENT: No sore throat. Cardiovascular: Denies chest pain. Respiratory: Denies shortness of breath. Gastrointestinal: + abdominal pain.  + resolved nausea and vomiting.  No diarrhea.  Chronic constipation. Genitourinary: Negative for dysuria. Musculoskeletal: Negative for back pain. Skin: Negative for rash. Neurological:  Negative for headaches, focal weakness or numbness.  10-point ROS otherwise negative.  ____________________________________________   PHYSICAL EXAM:  VITAL SIGNS: ED Triage Vitals  Enc Vitals Group     BP 07/15/15 0710 156/59 mmHg     Pulse Rate 07/15/15 0710 62     Resp 07/15/15 0710 18     Temp 07/15/15 0710 97.6 F (36.4 C)     Temp Source 07/15/15 0710 Oral     SpO2 07/15/15 0710 98 %     Weight 07/15/15 0710 180 lb (81.647 kg)     Height 07/15/15 0710 5\' 6"  (1.676 m)     Head Cir --      Peak Flow --      Pain Score 07/15/15 0711 10     Pain Loc --      Pain Edu? --      Excl. in GC? --     Constitutional: Alert and oriented. Well appearing and in no acute  distress. Eyes: Conjunctivae are normal. PERRL. EOMI. Head: Atraumatic. Nose: No congestion/rhinnorhea. Mouth/Throat: Mucous membranes are moist.  Oropharynx non-erythematous. Neck: No stridor.  Cardiovascular: Normal rate, regular rhythm. Grossly normal heart sounds.  Good peripheral circulation. Respiratory: Normal respiratory effort.  No retractions. Lungs CTAB. Gastrointestinal: Soft with moderate right upper quadrant tenderness to palpation, palpable mass associated with the umbilicus/periumbilical region consistent with known hernia but no tenderness associated with hernia. No distention.  No CVA tenderness. Genitourinary: deferred Musculoskeletal: No lower extremity tenderness nor edema.  No joint effusions. Neurologic:  Normal speech and language. No gross focal neurologic deficits are appreciated. No gait instability. Skin:  Skin is warm, dry and intact. No rash noted. Psychiatric: Mood and affect are normal. Speech and behavior are normal.  ____________________________________________   LABS (all labs ordered are listed, but only abnormal results are displayed)  Labs Reviewed  LIPASE, BLOOD - Abnormal; Notable for the following:    Lipase 14 (*)    All other components within normal limits  COMPREHENSIVE METABOLIC PANEL - Abnormal; Notable for the following:    Glucose, Bld 112 (*)    GFR calc non Af Amer 56 (*)    All other components within normal limits  CBC - Abnormal; Notable for the following:    Platelets 114 (*)    All other components within normal limits  URINALYSIS COMPLETEWITH MICROSCOPIC ([facility] ONLY) - Abnormal; Notable for the following:    Color, Urine YELLOW (*)    APPearance CLOUDY (*)    Leukocytes, UA TRACE (*)    Bacteria, UA MANY (*)    Squamous Epithelial / LPF 6-30 (*)    All other components within normal limits   ____________________________________________  EKG  ED ECG REPORT I, Gayla Doss, the attending physician, personally viewed  and interpreted this ECG.   Date: 07/15/2015  EKG Time: 12:24  Rate: 52  Rhythm: normal sinus rhythm  Axis: normal  Intervals:none  ST&T Change: No acute ST segment elevation  ____________________________________________  RADIOLOGY  Acute abdominal series IMPRESSION: 1. Mild subsegmental atelectasis at the left lung base. 2. Findings compatible with a partial mid to distal small bowel obstruction. There is no evidence of perforation.   CT abdomen and pelvis IMPRESSION: 1. Again noted large paraumbilical hernia containing small bowel loops without evidence of acute complication or small bowel obstruction. Trace fluid within inferior aspect of the hernia. Mild mesenteric edema within hernia again noted. 2. Colonic diverticula are noted distal left colon and sigmoid colon. No evidence  of acute diverticulitis. 3. Again noted cirrhosis of the liver. Stable collateral veins in right paracolic gutter. 4. No ascites or free air 5. Again noted extensive atherosclerotic vascular calcifications. 6. Unremarkable urinary bladder. 7. Degenerative changes lumbar spine.  ____________________________________________   PROCEDURES  Procedure(s) performed: None  Critical Care performed: No  ____________________________________________   INITIAL IMPRESSION / ASSESSMENT AND PLAN / ED COURSE  Pertinent labs & imaging results that were available during my care of the patient were reviewed by me and considered in my medical decision making (see chart for details).  [name] is a 81 y.o. female with history of hypertension and hernia repair who presents for evaluation of continued right upper quadrant abdominal pain, sudden onset 3 days ago, constant since onset. On exam, she is generally well-appearing and in no acute distress. Vital signs stable, she is afebrile. She has moderate tenderness to palpation in the right upper quadrant and known nontender umbilical hernia. She was CT  scan 2 days ago showing chronic findings associated with her hernia, normal gallbladder. Her pain is not worsened. She has no new vomiting, no diarrhea, no chills. Labs reviewed and are generally unremarkable including normal lipase, normal LFTs, no leukocytosis. She has chronicstable thrombocytopenia on chart review.Plan for pain control, acute abdominal series.  ----------------------------------------- 12:17 PM on 07/15/2015 ----------------------------------------- UA contaminated with multiple squames and patient has no urinary complaints so will not treat. Urine culture sent.Findings on acute abdominal plain films were concerning for possible small bowel obstruction however repeat CT of the abdomen and pelvis today shows no evidence of bowel obstruction, chronic findings which are unchanged since most recent CT scan. Patient with significant improvement in her pain. She has tolerated by mouth intake. Discussed continued symptomatic management, return precautions, need for PCP follow-up and prn surgical follow-up for hernia. Patient and her family at bedside are comfortable with the discharge plan. ____________________________________________   FINAL CLINICAL IMPRESSION(S) / ED DIAGNOSES  Final diagnoses:  RUQ abdominal pain      Gayla Doss, MD 07/15/15 1232

## 2015-07-15 NOTE — ED Notes (Signed)
Pt reports RUQ pain since Saturday, was seen here Saturday and d/c. Pt reports continued pain. Pt reports nausea, denies vomiting or diarrhea.

## 2015-07-17 LAB — URINE CULTURE

## 2015-07-19 ENCOUNTER — Ambulatory Visit (INDEPENDENT_AMBULATORY_CARE_PROVIDER_SITE_OTHER): Payer: Medicare Other | Admitting: Surgery

## 2015-07-19 ENCOUNTER — Encounter: Payer: Self-pay | Admitting: Surgery

## 2015-07-19 VITALS — Temp 97.9°F | Ht 64.0 in | Wt 177.0 lb

## 2015-07-19 DIAGNOSIS — R1011 Right upper quadrant pain: Secondary | ICD-10-CM | POA: Diagnosis not present

## 2015-07-19 MED ORDER — OXYCODONE-ACETAMINOPHEN 5-325 MG PO TABS: 1.0000 | ORAL_TABLET | Freq: Four times a day (QID) | ORAL | Status: DC | PRN

## 2015-07-19 NOTE — Patient Instructions (Addendum)
Take a laxative once a day to help you with your constipation.  Give [place] a call if your pain gets worse before coming to the office in two weeks.  Potential Laxatives to try: miralax Dulcolax Senokot Senokot S

## 2015-07-19 NOTE — Progress Notes (Signed)
CC: Follow up for RUQ pain.    HPI: [name] is a pleasant 81 yo F with a history of recurrent abdominal hernia who presented last week with acute onset RUQ pain.  Began acutely following emesis.  Otherwise doing well.  Tolerating diet.  C/o constipation which is only resolved with miralax, which she does not want to take daily.  Multiple CT scans show no obvious etiology for pain.  No fevers/chills, night sweats, shortness of breath, cough, chest pain, nausea/vomiting, dysuria/hematuria.  Temperature 97.9 F (36.6 C), temperature source Oral, height  (1.626 m), weight 80.287 kg (177 lb). GEN: NAD/A&Ox3 ABD: Soft, min tender, nondistended, large unreducible and nontender hernia  Labs: Personally reviewed, unimpressive Imaging: Personally reviewed, unimpressive  [name] 81 yo with RUQ pain after emesis.  I feel that this is likely musculoskeletal pain due to emesis but has difficulty with BM.  As constipation can cause pain and that this is the easiest to treat, I have told her to take a daily laxative.  I have also suggested RUQ ultrasound to rule out cholelithiasis, but an gallbladder surgery would be difficult with her large hernia.  F/u in 2 weeks to see if improvement.

## 2015-07-29 ENCOUNTER — Ambulatory Visit: Payer: Self-pay | Admitting: Surgery

## 2015-08-02 ENCOUNTER — Emergency Department: Payer: Medicare Other

## 2015-08-02 ENCOUNTER — Emergency Department
Admission: EM | Admit: 2015-08-02 | Discharge: 2015-08-02 | Disposition: A | Discharge status: Home / Self Care | Payer: Medicare Other | Attending: Emergency Medicine | Admitting: Emergency Medicine

## 2015-08-02 ENCOUNTER — Other Ambulatory Visit: Payer: Self-pay

## 2015-08-02 ENCOUNTER — Encounter: Payer: Self-pay | Admitting: Emergency Medicine

## 2015-08-02 DIAGNOSIS — Z79899 Other long term (current) drug therapy: Secondary | ICD-10-CM | POA: Insufficient documentation

## 2015-08-02 DIAGNOSIS — R42 Dizziness and giddiness: Secondary | ICD-10-CM | POA: Insufficient documentation

## 2015-08-02 DIAGNOSIS — I10 Essential (primary) hypertension: Secondary | ICD-10-CM | POA: Insufficient documentation

## 2015-08-02 LAB — CBC
HCT: 40.4 % (ref 35.0–47.0)
HEMOGLOBIN: 13.8 g/dL (ref 12.0–16.0)
MCH: 31.6 pg (ref 26.0–34.0)
MCHC: 34.3 g/dL (ref 32.0–36.0)
MCV: 92.1 fL (ref 80.0–100.0)
PLATELETS: 129 10*3/uL — AB (ref 150–440)
RBC: 4.38 MIL/uL (ref 3.80–5.20)
RDW: 13.4 % (ref 11.5–14.5)
WBC: 9.1 10*3/uL (ref 3.6–11.0)

## 2015-08-02 LAB — URINALYSIS COMPLETE WITH MICROSCOPIC (ARMC ONLY)
BILIRUBIN URINE: NEGATIVE
Glucose, UA: NEGATIVE mg/dL
HGB URINE DIPSTICK: NEGATIVE
KETONES UR: NEGATIVE mg/dL
LEUKOCYTES UA: NEGATIVE
Nitrite: NEGATIVE
PH: 7 (ref 5.0–8.0)
PROTEIN: NEGATIVE mg/dL
Specific Gravity, Urine: 1.006 (ref 1.005–1.030)

## 2015-08-02 LAB — COMPREHENSIVE METABOLIC PANEL
ALBUMIN: 4 g/dL (ref 3.5–5.0)
ALT: 17 U/L (ref 14–54)
ANION GAP: 10 (ref 5–15)
AST: 32 U/L (ref 15–41)
Alkaline Phosphatase: 72 U/L (ref 38–126)
BILIRUBIN TOTAL: 1.1 mg/dL (ref 0.3–1.2)
BUN: 23 mg/dL — ABNORMAL HIGH (ref 6–20)
CHLORIDE: 101 mmol/L (ref 101–111)
CO2: 28 mmol/L (ref 22–32)
Calcium: 9.8 mg/dL (ref 8.9–10.3)
Creatinine, Ser: 1.18 mg/dL — ABNORMAL HIGH (ref 0.44–1.00)
GFR calc Af Amer: 49 mL/min — ABNORMAL LOW (ref >60)
GFR, EST NON AFRICAN AMERICAN: 42 mL/min — AB (ref >60)
Glucose, Bld: 117 mg/dL — ABNORMAL HIGH (ref 65–99)
POTASSIUM: 4 mmol/L (ref 3.5–5.1)
Sodium: 139 mmol/L (ref 135–145)
TOTAL PROTEIN: 6.7 g/dL (ref 6.5–8.1)

## 2015-08-02 LAB — GLUCOSE, CAPILLARY: GLUCOSE-CAPILLARY: 104 mg/dL — AB (ref 65–99)

## 2015-08-02 LAB — TROPONIN I: Troponin I: <0.03 ng/mL (ref <0.031)

## 2015-08-02 MED ORDER — MECLIZINE HCL 25 MG PO TABS
25.0000 mg | ORAL_TABLET | Freq: Once | ORAL | Status: AC
  Administered 2015-08-02: 25 mg via ORAL
  Filled 2015-08-02: qty 1

## 2015-08-02 MED ORDER — MECLIZINE HCL 25 MG PO TABS: 25.0000 mg | ORAL_TABLET | Freq: Three times a day (TID) | ORAL | Status: DC | PRN

## 2015-08-02 NOTE — Discharge Instructions (Signed)
We believe your symptoms were caused by benign vertigo.  Please read through the included information and take any prescribed medication(s).  Follow up with your doctor as listed above.  Your blood work, head CT, and urinalysis today were all reassuring.  If you develop any new or worsening symptoms that concern you, including but not limited to persistent dizziness/vertigo, numbness or weakness in your arms or legs, altered mental status, persistent vomiting, or fever greater than 101, please return immediately to the Emergency Department.   Dizziness Dizziness is a common problem. It is a feeling of unsteadiness or light-headedness. You may feel like you are about to faint. Dizziness can lead to injury if you stumble or fall. A person of any age group can suffer from dizziness, but dizziness is more common in older adults. CAUSES  Dizziness can be caused by many different things, including:  Middle ear problems.  Standing for too long.  Infections.  An allergic reaction.  Aging.  An emotional response to something, such as the sight of blood.  Side effects of medicines.  Tiredness.  Problems with circulation or blood pressure.  Excessive use of alcohol or medicines, or illegal drug use.  Breathing too fast (hyperventilation).  An irregular heart rhythm (arrhythmia).  A low red blood cell count (anemia).  Pregnancy.  Vomiting, diarrhea, fever, or other illnesses that cause body fluid loss (dehydration).  Diseases or conditions such as Parkinson's disease, high blood pressure (hypertension), diabetes, and thyroid problems.  Exposure to extreme heat. DIAGNOSIS  Your health care provider will ask about your symptoms, perform a physical exam, and perform an electrocardiogram (ECG) to record the electrical activity of your heart. Your health care provider may also perform other heart or blood tests to determine the cause of your dizziness. These may include:  Transthoracic  echocardiogram (TTE). During echocardiography, sound waves are used to evaluate how blood flows through your heart.  Transesophageal echocardiogram (TEE).  Cardiac monitoring. This allows your health care provider to monitor your heart rate and rhythm in real time.  Holter monitor. This is a portable device that records your heartbeat and can help diagnose heart arrhythmias. It allows your health care provider to track your heart activity for several days if needed.  Stress tests by exercise or by giving medicine that makes the heart beat faster. TREATMENT  Treatment of dizziness depends on the cause of your symptoms and can vary greatly. HOME CARE INSTRUCTIONS   Drink enough fluids to keep your urine clear or pale yellow. This is especially important in very hot weather. In older adults, it is also important in cold weather.  Take your medicine exactly as directed if your dizziness is caused by medicines. When taking blood pressure medicines, it is especially important to get up slowly.  Rise slowly from chairs and steady yourself until you feel okay.  In the morning, first sit up on the side of the bed. When you feel okay, stand slowly while holding onto something until you know your balance is fine.  Move your legs often if you need to stand in one place for a long time. Tighten and relax your muscles in your legs while standing.  Have someone stay with you for 1-2 days if dizziness continues to be a problem. Do this until you feel you are well enough to stay alone. Have the person call your health care provider if he or she notices changes in you that are concerning.  Do not drive or use heavy  machinery if you feel dizzy.  Do not drink alcohol. SEEK IMMEDIATE MEDICAL CARE IF:   Your dizziness or light-headedness gets worse.  You feel nauseous or vomit.  You have problems talking, walking, or using your arms, hands, or legs.  You feel weak.  You are not thinking clearly or  you have trouble forming sentences. It may take a friend or family member to notice this.  You have chest pain, abdominal pain, shortness of breath, or sweating.  Your vision changes.  You notice any bleeding.  You have side effects from medicine that seems to be getting worse rather than better. MAKE SURE YOU:   Understand these instructions.  Will watch your condition.  Will get help right away if you are not doing well or get worse. Document Released: 05/26/2001 Document Revised: 12/05/2013 Document Reviewed: 06/19/2011 [facility] Patient Information 2015 [place], [place]. This information is not intended to replace advice given to you by your health care provider. Make sure you discuss any questions you have with your health care provider.  Benign Positional Vertigo Vertigo means you feel like you or your surroundings are moving when they are not. Benign positional vertigo is the most common form of vertigo. Benign means that the cause of your condition is not serious. Benign positional vertigo is more common in older adults. CAUSES  Benign positional vertigo is the result of an upset in the labyrinth system. This is an area in the middle ear that helps control your balance. This may be caused by a viral infection, head injury, or repetitive motion. However, often no specific cause is found. SYMPTOMS  Symptoms of benign positional vertigo occur when you move your head or eyes in different directions. Some of the symptoms may include:  Loss of balance and falls.  Vomiting.  Blurred vision.  Dizziness.  Nausea.  Involuntary eye movements (nystagmus). DIAGNOSIS  Benign positional vertigo is usually diagnosed by physical exam. If the specific cause of your benign positional vertigo is unknown, your caregiver may perform imaging tests, such as magnetic resonance imaging (MRI) or computed tomography (CT). TREATMENT  Your caregiver may recommend movements or procedures to correct  the benign positional vertigo. Medicines such as meclizine, benzodiazepines, and medicines for nausea may be used to treat your symptoms. In rare cases, if your symptoms are caused by certain conditions that affect the inner ear, you may need surgery. HOME CARE INSTRUCTIONS   Follow your caregiver's instructions.  Move slowly. Do not make sudden body or head movements.  Avoid driving.  Avoid operating heavy machinery.  Avoid performing any tasks that would be dangerous to you or others during a vertigo episode.  Drink enough fluids to keep your urine clear or pale yellow. SEEK IMMEDIATE MEDICAL CARE IF:   You develop problems with walking, weakness, numbness, or using your arms, hands, or legs.  You have difficulty speaking.  You develop severe headaches.  Your nausea or vomiting continues or gets worse.  You develop visual changes.  Your family or friends notice any behavioral changes.  Your condition gets worse.  You have a fever.  You develop a stiff neck or sensitivity to light. MAKE SURE YOU:   Understand these instructions.  Will watch your condition.  Will get help right away if you are not doing well or get worse. Document Released: 09/07/2006 Document Revised: 02/22/2012 Document Reviewed: 08/20/2011 [facility] Patient Information 2015 [place], [place]. This information is not intended to replace advice given to you by your health care provider.  Make sure you discuss any questions you have with your health care provider. ° °

## 2015-08-02 NOTE — ED Provider Notes (Signed)
-----------------------------------------   7:00 AM on 08/02/2015 -----------------------------------------   Blood pressure 145/56, pulse 73, temperature 98.5 F (36.9 C), temperature source Oral, resp. rate 18, height  (1.651 m), weight 175 lb (79.379 kg), SpO2 95 %.  Assuming care from Dr. Pershing Proud.  In short, [name] is a 81 y.o. female with a chief complaint of Dizziness .  Refer to the original H&P for additional details.  The current plan of care is to follow-up on her urinalysis result, have the patient ambulate, and reassess her symptoms.  ----------------------------------------- 9:39 AM on 08/02/2015 -----------------------------------------  The patient states that she is feeling better after the meclizine.  She was able to walk to and from the toilet without any direct assistance.  I discussed all the negative (reassuring) findings with the patient and her family and they are going to follow up with [place] [facility].  I gave my usual and customary return precautions.  Based on her exam and findings at this time I do not believe that she is suffering from a CVA, arterial insufficiency, other severe/emergent medical condition.  Loleta Rose, MD 08/02/15 [phone]

## 2015-08-02 NOTE — ED Notes (Signed)
Pt's family member approached nurse's station and said that pt's toe was cramping. This nurse went in and massaged great toe, 2nd toe, foot, and ankle (right). Pt then asked to use the restroom, so pt assisted to restroom.

## 2015-08-02 NOTE — ED Notes (Signed)
Pt presents to ED via EMS from personal home with c/o of dizziness. EMS states pt experienced vomiting and constipation sx yesterday and self-administered a laxative for sx. EMS states pt awoke this morning with presenting sx at approximately 4:30 am. EMS states pt was sitting in bed when she experienced these sx, no reports of fall. Pt arrived to ED alert and oriented x4, able to respond to verbal and physical commands appropriately.

## 2015-08-02 NOTE — ED Provider Notes (Signed)
[facility] Emergency Department Provider Note  ____________________________________________  Time seen: Seen upon arrival to the emergency department  I have reviewed the triage vital signs and the nursing notes.   HISTORY  Chief Complaint Dizziness    HPI [name] is a 81 y.o. female with a history of hypertension who presents today with dizziness upon waking this morning at about 5 AM. She had difficulty walking upon waking and was dizzy upon movement of her head. She denies any ringing or roaring in her ears. No recent upper respiratory infection. Says the room appears that is spinning when she turns her head. However, when she lays still the sensation goes away. She does not have any pain. No chest pain or shortness of breath. Denies any dysuria or frequency. She said that she felt normal when she went to sleep last night at 9 PM.   Past Medical History  Diagnosis Date  . Hypertension     There are no active problems to display for this patient.   Past Surgical History  Procedure Laterality Date  . Hernia repair    . Joint replacement      left knee    Current Outpatient Rx  Name  Route  Sig  Dispense  Refill  . amLODipine (NORVASC) 10 MG tablet   Oral   Take 1 tablet by mouth daily.      5   . DOCQLACE 100 MG capsule   Oral   Take 1 capsule by mouth 3 (three) times daily.      2     Dispense as written.   . labetalol (NORMODYNE) 100 MG tablet   Oral   Take 1 tablet by mouth 2 (two) times daily.      5   . ondansetron (ZOFRAN ODT) 4 MG disintegrating tablet   Oral   Take 1 tablet (4 mg total) by mouth every 8 (eight) hours as needed for nausea or vomiting.   20 tablet   0   . oxyCODONE-acetaminophen (ROXICET) 5-325 MG per tablet   Oral   Take 1 tablet by mouth every 6 (six) hours as needed.   20 tablet   0   . potassium chloride (K-DUR) 10 MEQ tablet   Oral   Take 1 tablet by mouth daily.      1   .  ranitidine (ZANTAC) 150 MG tablet   Oral   Take 1 tablet (150 mg total) by mouth at bedtime.   30 tablet   1   . triamterene-hydrochlorothiazide (MAXZIDE-25) 37.5-25 MG per tablet   Oral   Take 1 tablet by mouth daily.      5     Allergies Sulfa antibiotics  No family history on file.  Social History Social History  Substance Use Topics  . Smoking status: Never Smoker   . Smokeless tobacco: None  . Alcohol Use: No    Review of Systems Constitutional: No fever/chills Eyes: No visual changes. ENT: No sore throat. Cardiovascular: Denies chest pain. Respiratory: Denies shortness of breath. Gastrointestinal: No abdominal pain.  No nausea, no vomiting.  No diarrhea.  No constipation. Genitourinary: Negative for dysuria. Musculoskeletal: Negative for back pain. Skin: Negative for rash. Neurological: Negative for headaches, focal weakness or numbness.  10-point ROS otherwise negative.  ____________________________________________   PHYSICAL EXAM:  VITAL SIGNS: ED Triage Vitals  Enc Vitals Group     BP 08/02/15 0535 151/81 mmHg     Pulse Rate 08/02/15 0535 75  Resp 08/02/15 0535 18     Temp 08/02/15 0535 98.5 F (36.9 C)     Temp Source 08/02/15 0535 Oral     SpO2 08/02/15 0535 100 %     Weight 08/02/15 0535 175 lb (79.379 kg)     Height 08/02/15 0535  (1.651 m)     Head Cir --      Peak Flow --      Pain Score 08/02/15 0536 0     Pain Loc --      Pain Edu? --      Excl. in GC? --     Constitutional: Alert and oriented. Well appearing and in no acute distress. Eyes: Conjunctivae are normal. PERRL. EOMI. no nystagmus Head: Atraumatic. Normal TMs bilaterally Nose: No congestion/rhinnorhea. Mouth/Throat: Mucous membranes are moist.  Oropharynx non-erythematous. Neck: No stridor.   Cardiovascular: Normal rate, regular rhythm. Grossly normal heart sounds.  Good peripheral circulation. Respiratory: Normal respiratory effort.  No retractions. Lungs  CTAB. Gastrointestinal: Soft and nontender. No distention. No abdominal bruits. No CVA tenderness. Musculoskeletal: No lower extremity tenderness nor edema.  No joint effusions. Neurologic:  Normal speech and language. No gross focal neurologic deficits are appreciated. Normal coordination with hand movements. Skin:  Skin is warm, dry and intact. No rash noted. Psychiatric: Mood and affect are normal. Speech and behavior are normal.  ____________________________________________   LABS (all labs ordered are listed, but only abnormal results are displayed)  Labs Reviewed  CBC - Abnormal; Notable for the following:    Platelets 129 (*)    All other components within normal limits  COMPREHENSIVE METABOLIC PANEL - Abnormal; Notable for the following:    Glucose, Bld 117 (*)    BUN 23 (*)    Creatinine, Ser 1.18 (*)    GFR calc non Af Amer 42 (*)    GFR calc Af Amer 49 (*)    All other components within normal limits  GLUCOSE, CAPILLARY - Abnormal; Notable for the following:    Glucose-Capillary 104 (*)    All other components within normal limits  TROPONIN I  URINALYSIS COMPLETEWITH MICROSCOPIC ([facility] ONLY)  CBG MONITORING, ED   ____________________________________________  EKG  ED ECG REPORT I, Arelia Longest, the attending physician, personally viewed and interpreted this ECG.   Date: 08/02/2015  EKG Time: 542  Rate: 65  Rhythm: Sinus rhythm with second-degree AV block Mobitz type I.  Axis: Normal axis  Intervals:second-degree A-V block, ( Mobitz I )  ST&T Change: No ST elevations or depressions. No abnormal T-wave inversions.  Has been in junctional rhythm in the past but not second-degree AV block Mobitz type I. ____________________________________________  RADIOLOGY  CT head without any acute intracranial processes. ____________________________________________   PROCEDURES    ____________________________________________   INITIAL IMPRESSION /  ASSESSMENT AND PLAN / ED COURSE  Pertinent labs & imaging results that were available during my care of the patient were reviewed by me and considered in my medical decision making (see chart for details).  ----------------------------------------- 7:10 AM on 08/02/2015 ----------------------------------------- Patient awaiting urinalysis. With peripheral vertigo. To be reassessed for improvement with meclizine. Unlikely to have symptoms from arrhythmia. Only symptomatic when moving head. Signed out to Dr. York Cerise.  ____________________________________________   FINAL CLINICAL IMPRESSION(S) / ED DIAGNOSES  Acute vertigo. Initial visit.    Myrna Blazer, MD 08/02/15 [phone]

## 2016-06-02 ENCOUNTER — Emergency Department
Admission: EM | Admit: 2016-06-02 | Discharge: 2016-06-02 | Disposition: A | Discharge status: Home / Self Care | Payer: Medicare Other | Attending: Emergency Medicine | Admitting: Emergency Medicine

## 2016-06-02 DIAGNOSIS — Z79899 Other long term (current) drug therapy: Secondary | ICD-10-CM | POA: Diagnosis not present

## 2016-06-02 DIAGNOSIS — I878 Other specified disorders of veins: Secondary | ICD-10-CM

## 2016-06-02 DIAGNOSIS — I10 Essential (primary) hypertension: Secondary | ICD-10-CM | POA: Diagnosis not present

## 2016-06-02 DIAGNOSIS — L03115 Cellulitis of right lower limb: Secondary | ICD-10-CM | POA: Diagnosis not present

## 2016-06-02 DIAGNOSIS — I872 Venous insufficiency (chronic) (peripheral): Secondary | ICD-10-CM | POA: Insufficient documentation

## 2016-06-02 DIAGNOSIS — R21 Rash and other nonspecific skin eruption: Secondary | ICD-10-CM | POA: Diagnosis present

## 2016-06-02 LAB — BASIC METABOLIC PANEL
Anion gap: 7 (ref 5–15)
BUN: 18 mg/dL (ref 6–20)
CHLORIDE: 106 mmol/L (ref 101–111)
CO2: 25 mmol/L (ref 22–32)
Calcium: 9.8 mg/dL (ref 8.9–10.3)
Creatinine, Ser: 0.92 mg/dL (ref 0.44–1.00)
GFR calc Af Amer: >60 mL/min (ref >60)
GFR calc non Af Amer: 56 mL/min — ABNORMAL LOW (ref >60)
GLUCOSE: 90 mg/dL (ref 65–99)
POTASSIUM: 4.2 mmol/L (ref 3.5–5.1)
Sodium: 138 mmol/L (ref 135–145)

## 2016-06-02 LAB — CBC
HCT: 39.9 % (ref 35.0–47.0)
HEMOGLOBIN: 13.8 g/dL (ref 12.0–16.0)
MCH: 32.4 pg (ref 26.0–34.0)
MCHC: 34.6 g/dL (ref 32.0–36.0)
MCV: 93.6 fL (ref 80.0–100.0)
Platelets: 114 10*3/uL — ABNORMAL LOW (ref 150–440)
RBC: 4.26 MIL/uL (ref 3.80–5.20)
RDW: 13.2 % (ref 11.5–14.5)
WBC: 10.1 10*3/uL (ref 3.6–11.0)

## 2016-06-02 MED ORDER — VANCOMYCIN HCL 10 G IV SOLR
1500.0000 mg | INTRAVENOUS | Status: AC
  Administered 2016-06-02: 1500 mg via INTRAVENOUS
  Filled 2016-06-02: qty 1500

## 2016-06-02 MED ORDER — VANCOMYCIN HCL 10 G IV SOLR
1500.0000 mg | Freq: Once | INTRAVENOUS | Status: DC
  Filled 2016-06-02: qty 1500

## 2016-06-02 MED ORDER — CEPHALEXIN 500 MG PO CAPS: 500.0000 mg | ORAL_CAPSULE | Freq: Four times a day (QID) | ORAL | Status: AC

## 2016-06-02 NOTE — ED Notes (Signed)
Pharmacy called for vancomycin for a second time. Pt awaiting IV antibiotic infusion at this time.

## 2016-06-02 NOTE — ED Notes (Signed)
Pt c/o tenderness and redness to the right lower leg for the past 2 weeks, wors[facility] today [facility]..Marland Kitchen

## 2016-06-02 NOTE — Discharge Instructions (Signed)
Please keep your right leg elevated above the level of your heart as much as possible. You may also apply ice to decrease swelling and discomfort for 10 minutes every 2-3 hours. Do not apply any additional creams, lotions, or other medications to the wound or rash on her leg. Complete the entire course of antibiotics even if you're feeling better. Please go to the [facility] that we [facility] for a reevaluation of your rash.  Return to the emergency department if you develop worsening swelling, redness that goes past the marker lines that we put on your leg today, fever, nausea or vomiting, or any other symptoms concerning to you. Cellulitis Cellulitis is an infection of the skin and the tissue beneath it. The infected area is usually red and tender. Cellulitis occurs most often in the arms and lower legs.  CAUSES  Cellulitis is caused by bacteria that enter the skin through cracks or cuts in the skin. The most common types of bacteria that cause cellulitis are staphylococci and streptococci. SIGNS AND SYMPTOMS   Redness and warmth.  Swelling.  Tenderness or pain.  Fever. DIAGNOSIS  Your health care provider can usually determine what is wrong based on a physical exam. Blood tests may also be done. TREATMENT  Treatment usually involves taking an antibiotic medicine. HOME CARE INSTRUCTIONS   Take your antibiotic medicine as directed by your health care provider. Finish the antibiotic even if you start to feel better.  Keep the infected arm or leg elevated to reduce swelling.  Apply a warm cloth to the affected area up to 4 times per day to relieve pain.  Take medicines only as directed by your health care provider.  Keep all follow-up visits as directed by your health care provider. SEEK MEDICAL CARE IF:   You notice red streaks coming from the infected area.  Your red area gets larger or turns dark in color.  Your bone or joint underneath the  infected area becomes painful after the skin has healed.  Your infection returns in the same area or another area.  You notice a swollen bump in the infected area.  You develop new symptoms.  You have a fever. SEEK IMMEDIATE MEDICAL CARE IF:   You feel very sleepy.  You develop vomiting or diarrhea.  You have a general ill feeling (malaise) with muscle aches and pains.   This information is not intended to replace advice given to you by your health care provider. Make sure you discuss any questions you have with your health care provider.   Document Released: 09/09/2005 Document Revised: 08/21/2015 Document Reviewed: 02/15/2012 Elsevier Interactive Patient Education [employer]2016 [employer]

## 2016-06-02 NOTE — ED Notes (Addendum)
Awaiting pharmacy to send vancomycin, pharmacy informed.

## 2016-06-02 NOTE — ED Provider Notes (Signed)
[facility] Emergency Department Provider Note  ____________________________________________  Time seen: Approximately 3:32 PM  I have reviewed the triage vital signs and the nursing notes.   HISTORY  Chief Complaint Wound Infection    HPI [name] is a 82 y.o. female who is not diabetic or immunocompromised in any other way, with advanced age, presenting for left medial lower leg erythema.The patient reports that she has had chronic venous stasis changes bilaterally for years. Yesterday, she began to notice erythema with a worsening rash and some "soreness" on the medial aspect of the lower right leg just above the ankle. No known trauma. She tried peroxide and nystatin powder, but he continued to worsen today. She called her PMD, but was told that this person is no longer working at this Clinic and that she should come to the emergency department for further evaluation. She denies any systemic symptoms including fever, chills, nausea or vomiting. She does not have any calf pain, no dysuria or any personal history of blood clots. No known tick bite.   Past Medical History  Diagnosis Date  . Hypertension     There are no active problems to display for this patient.   Past Surgical History  Procedure Laterality Date  . Hernia repair    . Joint replacement      left knee    Current Outpatient Rx  Name  Route  Sig  Dispense  Refill  . amLODipine (NORVASC) 10 MG tablet   Oral   Take 1 tablet by mouth daily.      5   . cephALEXin (KEFLEX) 500 MG capsule   Oral   Take 1 capsule (500 mg total) by mouth 4 (four) times daily.   40 capsule   0   . DOCQLACE 100 MG capsule   Oral   Take 1 capsule by mouth 3 (three) times daily.      2     Dispense as written.   . labetalol (NORMODYNE) 100 MG tablet   Oral   Take 1 tablet by mouth 2 (two) times daily.      5   . meclizine (ANTIVERT) 25 MG tablet   Oral   Take 1 tablet (25 mg total) by  mouth 3 (three) times daily as needed for dizziness.   15 tablet   0   . ondansetron (ZOFRAN ODT) 4 MG disintegrating tablet   Oral   Take 1 tablet (4 mg total) by mouth every 8 (eight) hours as needed for nausea or vomiting.   20 tablet   0   . oxyCODONE-acetaminophen (ROXICET) 5-325 MG per tablet   Oral   Take 1 tablet by mouth every 6 (six) hours as needed.   20 tablet   0   . potassium chloride (K-DUR) 10 MEQ tablet   Oral   Take 1 tablet by mouth daily.      1   . ranitidine (ZANTAC) 150 MG tablet   Oral   Take 1 tablet (150 mg total) by mouth at bedtime.   30 tablet   1   . triamterene-hydrochlorothiazide (MAXZIDE-25) 37.5-25 MG per tablet   Oral   Take 1 tablet by mouth daily.      5     Allergies Sulfa antibiotics  No family history on file.  Social History Social History  Substance Use Topics  . Smoking status: Never Smoker   . Smokeless tobacco: None  . Alcohol Use: No  Review of Systems Constitutional: No fever/chills.No weakness or fatigue. Eyes: No visual changes. ENT: No sore throat. No congestion or rhinorrhea. Cardiovascular: Denies chest pain.  Respiratory: Denies shortness of breath.  No cough. Gastrointestinal: No abdominal pain.  No nausea, no vomiting.  No diarrhea.  No constipation. Genitourinary: Negative for dysuria. Musculoskeletal: Negative for back pain. Skin: Positive for right lower extremity rash. Neurological: Negative for headaches. No focal numbness, tingling or weakness.   10-point ROS otherwise negative.  ____________________________________________   PHYSICAL EXAM:  VITAL SIGNS: ED Triage Vitals  Enc Vitals Group     BP 06/02/16 1149 172/62 mmHg     Pulse Rate 06/02/16 1149 72     Resp 06/02/16 1149 17     Temp 06/02/16 1149 97.4 F (36.3 C)     Temp Source 06/02/16 1149 Oral     SpO2 06/02/16 1149 99 %     Weight 06/02/16 1149 180 lb (81.647 kg)     Height 06/02/16 1149  (1.651 m)     Head Cir  --      Peak Flow --      Pain Score 06/02/16 1150 8     Pain Loc --      Pain Edu? --      Excl. in GC? --     Constitutional: Alert and oriented. Well appearing and in no acute distress. Answers questions appropriately. Eyes: Conjunctivae are normal.  EOMI. No scleral icterus. Head: Atraumatic. Nose: No congestion/rhinnorhea. Mouth/Throat: Mucous membranes are moist.  Neck: No stridor.  Supple.   Cardiovascular: Normal rate.  Respiratory: Normal respiratory effort.   Musculoskeletal: Venous stasis changes are apparent on the bilateral lower extremities from the mid tibial shaft to the ankle, including thickening of the skin, darkening of the skin, and decreased hair. On the medial aspect of the right lower extremity, the patient has a central scab with surrounding mild flaking of skin, and a surrounding amount of erythema and warmth. There is no significant swelling or fluctuance. Decreased but palpable DP and PT pulses bilaterally. 5 out of 5 dorsiflexion and plantar flexion R. Neurologic:  A&Ox3.  Speech is clear.  Face and smile are symmetric.  EOMI.  Moves all extremities well. Normal gait without ataxia or significant pain. Skin:  Skin is warm, dry and intact. No rash noted. Psychiatric: Mood and affect are normal. Speech and behavior are normal.  Normal judgement. ____________________________________________   LABS (all labs ordered are listed, but only abnormal results are displayed)  Labs Reviewed  CBC - Abnormal; Notable for the following:    Platelets 114 (*)    All other components within normal limits  BASIC METABOLIC PANEL - Abnormal; Notable for the following:    GFR calc non Af Amer 56 (*)    All other components within normal limits   ____________________________________________  EKG  Not indicated ____________________________________________  RADIOLOGY  No results found.  ____________________________________________   PROCEDURES  Procedure(s)  performed: None  Critical Care performed: No ____________________________________________   INITIAL IMPRESSION / ASSESSMENT AND PLAN / ED COURSE  Pertinent labs & imaging results that were available during my care of the patient were reviewed by me and considered in my medical decision making (see chart for details).  82 y.o. female presenting with right lower extremity rash with overlying erythema and warmth. The patient's symptoms are most consistent with a cellulitis. She does have advanced age, but does not have diabetes or any other immunocompromised state. I am somewhat concerned  about her venous stasis and age, so I will plan one dose of IV vancomycin here, and then discharge the patient home with oral antibiotics. I am arranging a follow-up appointment with the patient at the [facility] clinic for tomorrow, so that she can have a reevaluation within 24 hours.  We discussed return precautions as well as follow-up instructions.  ____________________________________________  FINAL CLINICAL IMPRESSION(S) / ED DIAGNOSES  Final diagnoses:  Cellulitis of right lower extremity without foot  Venous stasis      NEW MEDICATIONS STARTED DURING THIS VISIT:  New Prescriptions   CEPHALEXIN (KEFLEX) 500 MG CAPSULE    Take 1 capsule (500 mg total) by mouth 4 (four) times daily.     Rockne Menghini, MD 06/02/16 1655

## 2016-06-02 NOTE — ED Notes (Signed)
Pt awaiting antibiotics to be finished, then to be discharged.

## 2016-06-10 ENCOUNTER — Encounter: Payer: Medicare Other | Attending: Internal Medicine | Admitting: Internal Medicine

## 2016-06-10 DIAGNOSIS — I10 Essential (primary) hypertension: Secondary | ICD-10-CM | POA: Diagnosis not present

## 2016-06-10 DIAGNOSIS — L03115 Cellulitis of right lower limb: Secondary | ICD-10-CM | POA: Insufficient documentation

## 2016-06-10 DIAGNOSIS — L97211 Non-pressure chronic ulcer of right calf limited to breakdown of skin: Secondary | ICD-10-CM | POA: Insufficient documentation

## 2016-06-10 DIAGNOSIS — I70232 Atherosclerosis of native arteries of right leg with ulceration of calf: Secondary | ICD-10-CM | POA: Insufficient documentation

## 2016-06-10 DIAGNOSIS — M1990 Unspecified osteoarthritis, unspecified site: Secondary | ICD-10-CM | POA: Diagnosis not present

## 2016-06-11 NOTE — Progress Notes (Signed)
[name], [name] ([id number]) Visit Report for 06/10/2016 Chief Complaint Document Details [name], [name]. Date of Service: 06/10/2016 8:45 AM Medical Record Patient Account Number: [id number] Number: Treating Hopkins: Jordan Mealy, Hopkins, BSN, Rita [birth year] [phone] y.o. Other Clinician: Date of Birth/Sex: Female) Treating [name] Primary Care Physician/Extender: [name] Physician: Referring Physician: Guy Hopkins Weeks in Treatment: 0 Information Obtained from: Patient Chief Complaint 06/10/16; the patient is here for review of a wound on the right anterior medial lower leg above the ankle Electronic Signature(s) Signed: 06/10/2016 4:24:59 PM By: Baltazar Najjar MD Entered By: Baltazar Najjar on 06/10/2016 09:23:53 [name] ([id number]) -------------------------------------------------------------------------------- Debridement Details [name] Date of Service: 06/10/2016 8:45 AM Medical Record Patient Account Number: [id number] Number: Treating Hopkins: Jordan Mealy, Hopkins, BSN, Rita [birth year] [phone] y.o. Other Clinician: Date of Birth/Sex: Female) Treating [name] Primary Care Physician/Extender: [name] Physician: Referring Physician: Guy Hopkins Weeks in Treatment: 0 Debridement Performed for Wound #1 Right,Medial Lower Leg Assessment: Performed By: Physician Jordan Caul, MD Debridement: Open Wound/Selective Debridement Selective Description: Pre-procedure Yes Verification/Time Out Taken: Start Time: 09:10 Pain Control: Lidocaine 2% Topical Gel Level: Non-Viable Tissue Total Area Debrided (L x 8 (cm) x 5 (cm) = 40 (cm) W): Tissue and other Non-Viable, Skin material debrided: Bleeding: None End Time: 09:15 Procedural Pain: 3 Post Procedural Pain: 3 Response to Treatment: Procedure was tolerated well Post Debridement Measurements of Total Wound Length: (cm) 8 Width: (cm) 5 Depth:  (cm) 0.1 Volume: (cm) 3.142 Post Procedure Diagnosis Same as Pre-procedure Electronic Signature(s) Signed: 06/10/2016 4:24:59 PM By: Baltazar Najjar MD Signed: 06/10/2016 5:43:53 PM By: [name] Entered By: Baltazar Najjar on 06/10/2016 09:23:24 [name], [name] ([id number]) [name], [name] ([id number]) -------------------------------------------------------------------------------- HPI Details [name] Date of Service: 06/10/2016 8:45 AM Medical Record Patient Account Number: [id number] Number: Treating Hopkins: Jordan Mealy, Hopkins, BSN, Rita 17-Feb-1931 [phone] y.o. Other Clinician: Date of Birth/Sex: Female) Treating [name] Primary Care Physician/Extender: [name] Physician: Referring Physician: Guy Hopkins Weeks in Treatment: 0 History of Present Illness HPI Description: 06/10/16; this is an 83 year old woman who lives near [place] with family members. She saw her primary doctor roughly over a week ago and was referred to the ER for a cellulitis in the right medial leg. She was given a round of IV antibiotics in the emergency room and discharged on oral Keflex which she is taking. She is been left with a uncomfortable leg area on the right medial lower leg. Her ABIs in this clinic were 0.8 on the right 0.6 on the left. She does not describe claudication. She is not a diabetic. Lab work in the ER showed a normal BUN and creatinine on 6/20 at 18 and 0.92 respectively white count was 10.1 hemoglobin at 13.8. She did not have any imaging studies. I can see no vascular evaluations in Epic. She does not have a prior wound history Electronic Signature(s) Signed: 06/10/2016 4:24:59 PM By: Baltazar Najjar MD Entered By: Baltazar Najjar on 06/10/2016 09:25:59 [name] ([id number]) -------------------------------------------------------------------------------- Physical Exam Details [name] Date of Service:  06/10/2016 8:45 AM Medical Record Patient Account Number: [id number] Number: Treating Hopkins: Jordan Mealy, Hopkins, BSN, Rita 15-Jun-1931 [phone] y.o. Other Clinician: Date of Birth/Sex: Female) Treating [name] Primary Care Physician/Extender: [name] Physician: Referring Physician: Guy Hopkins Weeks in Treatment: 0 Constitutional Sitting or standing Blood Pressure is within target range for patient.Marland Kitchen  Pulse regular and within target range for patient.Marland Kitchen Respirations regular, non-labored and within target range.. Temperature is normal and within the target range for the patient.. Patient's appearance is neat and clean. Appears in no acute distress. Well nourished and well developed.. Eyes Conjunctivae clear. No discharge.Marland Kitchen Respiratory Respiratory effort is easy and symmetric bilaterally. Rate is normal at rest and on room air.. Bilateral breath sounds are clear and equal in all lobes with no wheezes, rales or rhonchi.. Cardiovascular Midsystolic murmur she appears to be euvolemic. Femoral arteries without bruits and pulses strong.. Pedal pulses absent bilaterally. Both dorsalis pedis. The patient also has probable significant venous insufficiency with hemosiderin deposition. This probably contributed to the genesis of her cellulitis. Nevertheless her edema control is fairly good. Lymphatic Nonpalpable in the popliteal or inguinal areas. Psychiatric No evidence of depression, anxiety, or agitation. Calm, cooperative, and communicative. Appropriate interactions and affect.. Notes Wound exam; the area in question is on the right medial lower leg in the setting of chronic venous insufficiency with hemosiderin deposition. The area of concern about the cellulitis had been marked with a pen in the ER and this is still visible. Obviously this is improved confirming the cellulitis diagnosis of. She has a fairly substantial area of damaged epithelium probably from the infection  itself. I was able to remove most of this. She has some small open areas here however nothing really looked ominous. Electronic Signature(s) Signed: 06/10/2016 4:24:59 PM By: Baltazar Najjar MD Entered By: Baltazar Najjar on 06/10/2016 09:28:50 [name], Gardiner Hopkins ([id number]) -------------------------------------------------------------------------------- Physician Orders Details Patient Name: CARIANN, KINNAMON. Date of Service: 06/10/2016 8:45 AM Medical Record Patient Account Number: [id number] Number: Treating Hopkins: Jordan Mealy, Hopkins, BSN, Rita Jun 17, 1931 [phone] y.o. Other Clinician: Date of Birth/Sex: Female) Treating Hiro Vipond Primary Care Physician/Extender: [name] Physician: Referring Physician: [name] in Treatment: 0 Verbal / Phone Orders: Yes Clinician: Afful, Hopkins, BSN, Rita Read Back and Verified: Yes Diagnosis Coding Wound Cleansing Wound #1 Right,Medial Lower Leg o Cleanse wound with mild soap and water o May shower with protection. o No tub bath. Anesthetic Wound #1 Right,Medial Lower Leg o Topical Lidocaine 4% cream applied to wound bed prior to debridement Skin Barriers/Peri-Wound Care Wound #1 Right,Medial Lower Leg o Barrier cream o Moisturizing lotion Primary Wound Dressing Wound #1 Right,Medial Lower Leg o Aquacel Ag Secondary Dressing Wound #1 Right,Medial Lower Leg o ABD pad Dressing Change Frequency Wound #1 Right,Medial Lower Leg o Change dressing every week Follow-up Appointments Wound #1 Right,Medial Lower Leg o Return Appointment in 1 week. Additional Orders / Instructions LICET, DUNPHY. ([id number]) Wound #1 Right,Medial Lower Leg o Increase protein intake. o OK to return to work with the following restrictions: o Activity as tolerated Consults o Vascular - Bilateral Arterial Studies Electronic Signature(s) Signed: 06/10/2016 4:24:59 PM By: Baltazar Najjar MD Signed: 06/10/2016 5:43:53  PM By: [name] Entered By: Jordan Eric on 06/10/2016 09:16:56 Bieser, Gardiner Hopkins ([id number]) -------------------------------------------------------------------------------- Problem List Details Patient Name: AUNESTY, TYSON. Date of Service: 06/10/2016 8:45 AM Medical Record Patient Account Number: [id number] Number: Treating Hopkins: Jordan Mealy, Hopkins, BSN, Rita 1931/09/03 [phone] y.o. Other Clinician: Date of Birth/Sex: Female) Treating Diamante Rubin Primary Care Physician/Extender: [name] Physician: Referring Physician: Guy Hopkins Weeks in Treatment: 0 Active Problems ICD-10 Encounter Code Description Active Date Diagnosis L97.211 Non-pressure chronic ulcer of right calf limited to 06/10/2016 Yes breakdown of skin L03.115 Cellulitis of right lower limb 06/10/2016 Yes I70.232 Atherosclerosis of native arteries  of right leg with 06/10/2016 Yes ulceration of calf Inactive Problems Resolved Problems Electronic Signature(s) Signed: 06/10/2016 4:24:59 PM By: Baltazar Najjarobson, Amauri Medellin MD Entered By: Baltazar Najjarobson, Wauneta Silveria on 06/10/2016 09:22:13 [place], [name] ([id number]) -------------------------------------------------------------------------------- Progress Note Details Patient Name: Jordan BandaFULLER, Jordan J. Date of Service: 06/10/2016 8:45 AM Medical Record Patient Account Number: [id number] Number: Treating Hopkins: Jordan MealyAfful, Hopkins, BSN, Rita 05/24/1931 [phone](82 y.o. Other Clinician: Date of Birth/Sex: Female) Treating Layten Aiken Primary Care Physician/Extender: Jordan LibraG FARAHI, Hopkins Physician: Referring Physician: [name], Hopkins Weeks in Treatment: 0 Subjective Chief Complaint Information obtained from Patient 06/10/16; the patient is here for review of a wound on the right anterior medial lower leg above the ankle History of Present Illness (HPI) 06/10/16; this is an 82 year old woman who [place]. She saw her primary doctor roughly over a week ago  and was referred to the ER for a cellulitis in the right medial leg. She was given a round of IV antibiotics in the emergency room and discharged on oral Keflex which she is taking. She is been left with a uncomfortable leg area on the right medial lower leg. Her ABIs in this clinic were 0.8 on the right 0.6 on the left. She does not describe claudication. She is not a diabetic. Lab work in the ER showed a normal BUN and creatinine on 6/20 at 18 and 0.92 respectively white count was 10.1 hemoglobin at 13.8. She did not have any imaging studies. I can see no vascular evaluations in Epic. She does not have a prior wound history Wound History Patient presents with 1 open wound that has been present for approximately 1 week. Patient has been treating wound in the following manner: open to air. Laboratory tests have not been performed in the last month. Patient reportedly has not tested positive for an antibiotic resistant organism. Patient reportedly has not tested positive for osteomyelitis. Patient reportedly has not had testing performed to evaluate circulation in the legs. Patient History Allergies sulfur Family History Cancer - Siblings, Diabetes - Child, Hypertension - Child, No family history of Heart Disease, Hereditary Spherocytosis, Kidney Disease, Lung Disease, Seizures, Stroke, Thyroid Problems, Tuberculosis. Social History Never smoker, Marital Status - Widowed, Alcohol Use - Never, Drug Use - No History, Caffeine Use - Never. Jordan BandaFULLER, Jordan J. ([id number]) Medical History Eyes Patient has history of Cataracts Ear/Nose/Mouth/Throat Denies history of Chronic sinus problems/congestion, Middle ear problems Hematologic/Lymphatic Denies history of Anemia, Hemophilia, Human Immunodeficiency Virus, Lymphedema, Sickle Cell Disease Respiratory Denies history of Aspiration, Asthma, Chronic Obstructive Pulmonary Disease (COPD), Pneumothorax, Sleep Apnea,  Tuberculosis Cardiovascular Patient has history of Arrhythmia, Hypertension Denies history of Angina Gastrointestinal Denies history of Cirrhosis , Colitis, Crohn s, Hepatitis A, Hepatitis B, Hepatitis C Endocrine Denies history of Type I Diabetes, Type II Diabetes Genitourinary Denies history of End Stage Renal Disease Immunological Denies history of Lupus Erythematosus, Raynaud s, Scleroderma Integumentary (Skin) Denies history of History of Burn, History of pressure wounds Musculoskeletal Patient has history of Osteoarthritis Neurologic Denies history of Dementia, Neuropathy, Quadriplegia, Paraplegia, Seizure Disorder Oncologic Denies history of Received Chemotherapy, Received Radiation Psychiatric Denies history of Anorexia/bulimia, Confinement Anxiety Review of Systems (ROS) Eyes Complains or has symptoms of Glasses / Contacts. Hematologic/Lymphatic The patient has no complaints or symptoms. Respiratory The patient has no complaints or symptoms. Cardiovascular The patient has no complaints or symptoms. Gastrointestinal The patient has no complaints or symptoms. Endocrine The patient has no complaints or symptoms. Genitourinary The patient has no complaints or symptoms. Immunological  The patient has no complaints or symptoms. Integumentary (Skin) Muffley, Rada J. ([id number]) Complains or has symptoms of Wounds, Breakdown, Swelling. Musculoskeletal The patient has no complaints or symptoms. Neurologic The patient has no complaints or symptoms. Oncologic The patient has no complaints or symptoms. Psychiatric The patient has no complaints or symptoms. Objective Constitutional Sitting or standing Blood Pressure is within target range for patient.. Pulse regular and within target range for patient.Marland Kitchen Respirations regular, non-labored and within target range.. Temperature is normal and within the target range for the patient.. Patient's appearance is neat and clean.  Appears in no acute distress. Well nourished and well developed.. Vitals Time Taken: 8:45 AM, Height: 61 in, Source: Stated, Weight: 170 lbs, Source: Measured, BMI: 32.1, Temperature: 97.7 F, Pulse: 64 bpm, Respiratory Rate: 16 breaths/min, Blood Pressure: 146/65 mmHg. Eyes Conjunctivae clear. No discharge.Marland Kitchen Respiratory Respiratory effort is easy and symmetric bilaterally. Rate is normal at rest and on room air.. Bilateral breath sounds are clear and equal in all lobes with no wheezes, rales or rhonchi.. Cardiovascular Midsystolic murmur she appears to be euvolemic. Femoral arteries without bruits and pulses strong.. Pedal pulses absent bilaterally. Both dorsalis pedis. The patient also has probable significant venous insufficiency with hemosiderin deposition. This probably contributed to the genesis of her cellulitis. Nevertheless her edema control is fairly good. Lymphatic Nonpalpable in the popliteal or inguinal areas. Psychiatric No evidence of depression, anxiety, or agitation. Calm, cooperative, and communicative. Appropriate interactions and affect.. General Notes: Wound exam; the area in question is on the right medial lower leg in the setting of chronic venous insufficiency with hemosiderin deposition. The area of concern about the cellulitis had been marked [name], Jordan J. ([id number]) with a pen in the ER and this is still visible. Obviously this is improved confirming the cellulitis diagnosis of. She has a fairly substantial area of damaged epithelium probably from the infection itself. I was able to remove most of this. She has some small open areas here however nothing really looked ominous. Integumentary (Hair, Skin) Wound #1 status is Open. Original cause of wound was Gradually Appeared. The wound is located on the Right,Medial Lower Leg. The wound measures 8cm length x 5cm width x 0.1cm depth; 31.416cm^2 area and 3.142cm^3 volume. The wound is limited to skin breakdown.  There is no tunneling or undermining noted. There is a small amount of drainage noted. The wound margin is indistinct and nonvisible. There is no granulation within the wound bed. There is a large (67-100%) amount of necrotic tissue within the wound bed including Eschar. The periwound skin appearance exhibited: Dry/Scaly, Moist. The periwound skin appearance did not exhibit: Callus, Crepitus, Excoriation, Fluctuance, Friable, Induration, Localized Edema, Rash, Scarring, Maceration, Atrophie Blanche, Cyanosis, Ecchymosis, Hemosiderin Staining, Mottled, Pallor, Rubor, Erythema. Periwound temperature was noted as Cool/Cold. The periwound has tenderness on palpation. Assessment Active Problems ICD-10 L97.211 - Non-pressure chronic ulcer of right calf limited to breakdown of skin L03.115 - Cellulitis of right lower limb I70.232 - Atherosclerosis of native arteries of right leg with ulceration of calf Procedures Wound #1 Wound #1 is a Venous Leg Ulcer located on the Right,Medial Lower Leg . There was a Non-Viable Tissue Open Wound/Selective [phone]) debridement with total area of 40 sq cm performed by Jordan Caul, MD. to remove Non-Viable tissue/material including Skin after achieving pain control using Lidocaine 2% Topical Gel. A time out was conducted prior to the start of the procedure. There was no bleeding. The procedure was tolerated well with a pain level  of 3 throughout and a pain level of 3 following the procedure. Post Debridement Measurements: 8cm length x 5cm width x 0.1cm depth; 3.142cm^3 volume. Post procedure Diagnosis Wound #1: Same as Pre-Procedure Jordan BandaFULLER, Jordan J. ([id number]) Plan Wound Cleansing: Wound #1 Right,Medial Lower Leg: Cleanse wound with mild soap and water May shower with protection. No tub bath. Anesthetic: Wound #1 Right,Medial Lower Leg: Topical Lidocaine 4% cream applied to wound bed prior to debridement Skin Barriers/Peri-Wound Care: Wound #1  Right,Medial Lower Leg: Barrier cream Moisturizing lotion Primary Wound Dressing: Wound #1 Right,Medial Lower Leg: Aquacel Ag Secondary Dressing: Wound #1 Right,Medial Lower Leg: ABD pad Dressing Change Frequency: Wound #1 Right,Medial Lower Leg: Change dressing every week Follow-up Appointments: Wound #1 Right,Medial Lower Leg: Return Appointment in 1 week. Additional Orders / Instructions: Wound #1 Right,Medial Lower Leg: Increase protein intake. OK to return to work with the following restrictions: Activity as tolerated Consults ordered were: Vascular - Bilateral Arterial Studies 1 I think the patient cellulitis has been successfully treated. She is been left with some damaged epithelium and a significant area of denuded skin on her medial right ankle. I was able to remove most of this. She has small open areas however none of this looks to ominous I think these areas will closed. #2 likely has some degree of PAD and I'm going to send her for formal arterial studies #3 likely has significant venous insufficiency. She does not have a prior history of wounds noted she have uncontrolled edema. Therefore I'm going to put venous reflux studies on hold until she has a history of recurrent ulcerations which at this point she just doesn't have. Jordan BandaFULLER, Jordan J. ([id number]) #4 we applied silver alginate with a Kerlix Coban wrap. Hopefully the area of concern here will stay intact #5 she also has venous insufficiency on the left with some dry skin. She was advised to use lubricating skin lotion on her legs before she goes to bed. Electronic Signature(s) Signed: 06/10/2016 4:24:59 PM By: Baltazar Najjarobson, Tramon Crescenzo MD Entered By: Baltazar Najjarobson, Makina Skow on 06/10/2016 09:30:58 Xiong, [name] ([id number]) -------------------------------------------------------------------------------- ROS/PFSH Details Patient Name: Jordan BandaFULLER, Elzina J. Date of Service: 06/10/2016 8:45 AM Medical Record Patient Account Number:  [id number] Number: Treating Hopkins: Jordan MealyAfful, Hopkins, BSN, Rita 1931-01-08 [phone](82 y.o. Other Clinician: Date of Birth/Sex: Female) Treating Addalynn Kumari Primary Care Physician/Extender: Jordan LibraG FARAHI, Hopkins Physician: Referring Physician: [name], Hopkins Weeks in Treatment: 0 Wound History Do you currently have one or more open woundso Yes How many open wounds do you currently haveo 1 Approximately how long have you had your woundso 1 week How have you been treating your wound(s) until nowo open to air Has your wound(s) ever healed and then re-openedo No Have you had any lab work done in the past montho No Have you tested positive for an antibiotic resistant organism (MRSA, VRE)o No Have you tested positive for osteomyelitis (bone infection)o No Have you had any tests for circulation on your legso No Eyes Complaints and Symptoms: Positive for: Glasses / Contacts Medical History: Positive for: Cataracts Integumentary (Skin) Complaints and Symptoms: Positive for: Wounds; Breakdown; Swelling Medical History: Negative for: History of Burn; History of pressure wounds Ear/Nose/Mouth/Throat Medical History: Negative for: Chronic sinus problems/congestion; Middle ear problems Hematologic/Lymphatic Complaints and Symptoms: No Complaints or Symptoms Medical History: Negative for: Anemia; Hemophilia; Human Immunodeficiency Virus; Lymphedema; Sickle Cell Disease [name], Jordan J. ([id number]) Respiratory Complaints and Symptoms: No Complaints or Symptoms Medical History: Negative for: Aspiration; Asthma; Chronic Obstructive Pulmonary Disease (COPD); Pneumothorax; Sleep  Apnea; Tuberculosis Cardiovascular Complaints and Symptoms: No Complaints or Symptoms Medical History: Positive for: Arrhythmia; Hypertension Negative for: Angina Gastrointestinal Complaints and Symptoms: No Complaints or Symptoms Medical History: Negative for: Cirrhosis ; Colitis; Crohnos; Hepatitis A; Hepatitis B;  Hepatitis C Endocrine Complaints and Symptoms: No Complaints or Symptoms Medical History: Negative for: Type I Diabetes; Type II Diabetes Genitourinary Complaints and Symptoms: No Complaints or Symptoms Medical History: Negative for: End Stage Renal Disease Immunological Complaints and Symptoms: No Complaints or Symptoms Medical History: Negative for: Lupus Erythematosus; Raynaudos; Scleroderma Musculoskeletal [name], KINTZ. ([id number]) Complaints and Symptoms: No Complaints or Symptoms Medical History: Positive for: Osteoarthritis Neurologic Complaints and Symptoms: No Complaints or Symptoms Medical History: Negative for: Dementia; Neuropathy; Quadriplegia; Paraplegia; Seizure Disorder Oncologic Complaints and Symptoms: No Complaints or Symptoms Medical History: Negative for: Received Chemotherapy; Received Radiation Psychiatric Complaints and Symptoms: No Complaints or Symptoms Medical History: Negative for: Anorexia/bulimia; Confinement Anxiety HBO Extended History Items Eyes: Cataracts Family and Social History Cancer: Yes - Siblings; Diabetes: Yes - Child; Heart Disease: No; Hereditary Spherocytosis: No; Hypertension: Yes - Child; Kidney Disease: No; Lung Disease: No; Seizures: No; Stroke: No; Thyroid Problems: No; Tuberculosis: No; Never smoker; Marital Status - Widowed; Alcohol Use: Never; Drug Use: No History; Caffeine Use: Never; Financial Concerns: No; Food, Clothing or Shelter Needs: No; Support System Lacking: No; Transportation Concerns: No; Advanced Directives: No; Patient does not want information on Radio broadcast assistant) Signed: 06/10/2016 4:24:59 PM By: Baltazar Najjar MD Signed: 06/10/2016 5:43:53 PM By: [name] Entered By: Jordan Eric on 06/10/2016 08:52:19 EMARA, LICHTER ([id number]) -------------------------------------------------------------------------------- SuperBill Details [name] Date of Service: 06/10/2016 Medical Record Patient Account Number: [id number] Number: Treating Hopkins: Jordan Mealy, Hopkins, BSN, [place] [birth year] [phone] y.o. 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 Facility Procedures CPT4 Code Description: 30865784 99213 - WOUND CARE VISIT-LEV 3 EST PT Modifier: Quantity: 1 CPT4 Code Description: [id number] 97597 - DEBRIDE WOUND 1ST 20 SQ CM OR < ICD-10 Description Diagnosis 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 Modifier: down of ski Quantity: 1 n CPT4 Code Description: 41324401 97598 - DEBRIDE WOUND EA ADDL 20 SQ CM ICD-10 Description Diagnosis 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 ([id number]) Modifier: down of skin Quantity: 1 Electronic Signature(s) Signed: 06/10/2016 4:24:59 PM By: Baltazar Najjar MD Entered By: Baltazar Najjar on 06/10/2016 09:31:45

## 2016-06-11 NOTE — Progress Notes (Signed)
[name], Jordan J. ([id number]) Visit Report for 06/10/2016 Allergy List Details [name], Jordan J. Date of Service: 06/10/2016 8:45 AM Medical Record Patient Account Number: [id number] Number: Treating Hopkins: [name] [birth year] [phone](82 y.o. Other Clinician: Date of Birth/Sex: Female) Treating [name] Primary Care Physician: [name] Physician/Extender: G Referring Physician: [name], Hopkins Weeks in Treatment: 0 Allergies Active Allergies sulfur Allergy Notes Electronic Signature(s) Signed: 06/10/2016 5:43:53 PM By: [name] Entered By: Jordan EricAfful, Hopkins on 06/10/2016 08:46:52 [name], [name] ([id number]) -------------------------------------------------------------------------------- Arrival Information Details [name], Jordan J. Date of Service: 06/10/2016 8:45 AM Medical Record Patient Account Number: [id number] Number: Treating Hopkins: [name] [birth year] [phone](82 y.o. Other Clinician: Date of Birth/Sex: Female) Treating [name] Primary Care Physician: [name] Physician/Extender: G Referring Physician: Gabriel RungFARAHI, Hopkins Weeks in Treatment: 0 Visit Information Patient Arrived: Cane Arrival Time: 08:39 Accompanied By: self Transfer Assistance: None Patient Identification Verified: Yes Secondary Verification Process Completed: Yes Patient Requires Transmission-Based No Precautions: Patient Has Alerts: No Electronic Signature(s) Signed: 06/10/2016 5:43:53 PM By: [name] Entered By: Jordan EricAfful, Hopkins on 06/10/2016 08:43:54 [name], [name] ([id number]) -------------------------------------------------------------------------------- Clinic Level of Care Assessment Details [name], Jordan J. Date of Service: 06/10/2016 8:45 AM Medical Record Patient Account Number: [id number] Number: Treating Hopkins: [name] [birth year] [phone](82 y.o. Other Clinician: Date of  Birth/Sex: Female) Treating [name] Primary Care Physician: [name] Physician/Extender: G Referring Physician: [name], Hopkins Weeks in Treatment: 0 Clinic Level of Care Assessment Items TOOL 1 Quantity Score []  - Use when EandM and Procedure is performed on INITIAL visit 0 ASSESSMENTS - Nursing Assessment / Reassessment X - General Physical Exam (combine w/ comprehensive assessment (listed just 1 20 below) when performed on new pt. evals) X - Comprehensive Assessment (HX, ROS, Risk Assessments, Wounds Hx, etc.) 1 25 ASSESSMENTS - Wound and Skin Assessment / Reassessment X - Dermatologic / Skin Assessment (not related to wound area) 1 10 ASSESSMENTS - Ostomy and/or Continence Assessment and Care []  - Incontinence Assessment and Management 0 []  - Ostomy Care Assessment and Management (repouching, etc.) 0 PROCESS - Coordination of Care X - Simple Patient / Family Education for ongoing care 1 15 []  - Complex (extensive) Patient / Family Education for ongoing care 0 X - Staff obtains ChiropractorConsents, Records, Test Results / Process Orders 1 10 []  - Staff telephones HHA, Nursing Homes / Clarify orders / etc 0 []  - Routine Transfer to another Facility (non-emergent condition) 0 []  - Routine Hospital Admission (non-emergent condition) 0 X - New Admissions / Manufacturing engineernsurance Authorizations / Ordering NPWT, Apligraf, etc. 1 15 []  - Emergency Hospital Admission (emergent condition) 0 PROCESS - Special Needs []  - Pediatric / Minor Patient Management 0 [name], Jordan J. ([id number]) []  - Isolation Patient Management 0 []  - Hearing / Language / Visual special needs 0 []  - Assessment of Community assistance (transportation, D/C planning, etc.) 0 []  - Additional assistance / Altered mentation 0 []  - Support Surface(s) Assessment (bed, cushion, seat, etc.) 0 INTERVENTIONS - Miscellaneous []  - External ear exam 0 []  - Patient Transfer (multiple staff / Nurse, adultHoyer Lift / Similar devices) 0 []  - Simple  Staple / Suture removal (25 or less) 0 []  - Complex Staple / Suture removal (26 or more) 0 []  - Hypo/Hyperglycemic Management (do not check if billed separately) 0 X - Ankle / Brachial Index (ABI) - do not check if billed separately 1 15 Has the patient  been seen at the hospital within the last three years: Yes Total Score: 110 Level Of Care: New/Established - Level 3 Electronic Signature(s) Signed: 06/10/2016 5:43:53 PM By: Jordan Eric BSN, Hopkins Entered By: Jordan Eric on 06/10/2016 09:17:31 [name] ([id number]) -------------------------------------------------------------------------------- Encounter Discharge Information Details Patient Name: Jordan Banda. Date of Service: 06/10/2016 8:45 AM Medical Record Patient Account Number: [id number] Number: Treating Hopkins: Jordan Mealy, Hopkins, BSN, Hopkins 1931-08-10 [phone] y.o. Other Clinician: Date of Birth/Sex: Female) Treating [name] Primary Care Physician: Jordan Begin Physician/Extender: G Referring Physician: Gabriel Hopkins in Treatment: 0 Encounter Discharge Information Items Discharge Pain Level: 0 Discharge Condition: Stable Ambulatory Status: Cane Discharge Destination: Home Transportation: Private Auto Accompanied By: self Schedule Follow-up Appointment: No Medication Reconciliation completed No and provided to Patient/Care Min [place]: Provided on Clinical Summary of Care: 06/10/2016 Form Type Recipient Paper Patient CF Electronic Signature(s) Signed: 06/10/2016 9:42:31 AM By: Jordan Eric BSN, Hopkins Previous Signature: 06/10/2016 9:29:36 AM Version By: Gwenlyn Perking Entered By: Jordan Eric on 06/10/2016 09:42:31 [name] ([id number]) -------------------------------------------------------------------------------- Lower Extremity Assessment Details Patient Name: Jordan Banda. Date of Service: 06/10/2016 8:45 AM Medical Record Patient Account Number: [id number] Number: Treating Hopkins:  Jordan Mealy, Hopkins, BSN, Hopkins [birth year] [phone] y.o. Other Clinician: Date of Birth/Sex: Female) Treating [name] Primary Care Physician: Jordan Begin Physician/Extender: G Referring Physician: Guy Begin Weeks in Treatment: 0 Vascular Assessment Claudication: Claudication Assessment [Left:None] [Right:None] Pulses: Posterior Tibial Palpable: [Left:No] [Right:No] Dorsalis Pedis Palpable: [Left:No] [Right:No] Extremity colors, hair growth, and conditions: Extremity Color: [Left:Dusky] [Right:Dusky] Hair Growth on Extremity: [Left:No] [Right:No] Temperature of Extremity: [Left:Cool] [Right:Cool] Capillary Refill: [Left:< 3 seconds] [Right:< 3 seconds] Blood Pressure: Brachial: [Left:145] [Right:138] Dorsalis Pedis: 100 [Left:Dorsalis Pedis: 118] Ankle: Posterior Tibial: [Left:Posterior Tibial: 0.69] [Right:0.81] Toe Nail Assessment Left: Right: Thick: Yes Yes Discolored: Yes Yes Deformed: No No Improper Length and Hygiene: No No Electronic Signature(s) Signed: 06/10/2016 5:43:53 PM By: Jordan Eric BSN, Hopkins Entered By: Jordan Eric on 06/10/2016 08:58:53 Barile, Kjerstin [place] ([id number]) -------------------------------------------------------------------------------- Multi Wound Chart Details Patient Name: Jordan Banda. Date of Service: 06/10/2016 8:45 AM Medical Record Patient Account Number: [id number] Number: Treating Hopkins: Jordan Mealy, Hopkins, BSN, Hopkins 07-02-1931 [phone] y.o. Other Clinician: Date of Birth/Sex: Female) Treating [name] Primary Care Physician: Jordan Begin Physician/Extender: G Referring Physician: Guy Begin Weeks in Treatment: 0 Vital Signs Height(in): 61 Pulse(bpm): 64 Weight(lbs): 170 Blood Pressure 146/65 (mmHg): Body Mass Index(BMI): 32 Temperature(F): 97.7 Respiratory Rate 16 (breaths/min): Photos: [1:No Photos] [N/A:N/A] Wound Location: [1:Right Lower Leg - Medial] [N/A:N/A] Wounding Event: [1:Gradually Appeared]  [N/A:N/A] Primary Etiology: [1:Venous Leg Ulcer] [N/A:N/A] Comorbid History: [1:Cataracts, Arrhythmia, Hypertension, Osteoarthritis] [N/A:N/A] Date Acquired: [1:06/01/2016] [N/A:N/A] Weeks of Treatment: [1:0] [N/A:N/A] Wound Status: [1:Open] [N/A:N/A] Measurements L x W x D 2x2x0.1 [N/A:N/A] (cm) Area (cm) : [1:3.142] [N/A:N/A] Volume (cm) : [1:0.314] [N/A:N/A] Classification: [1:Partial Thickness] [N/A:N/A] Exudate Amount: [1:Small] [N/A:N/A] Wound Margin: [1:Indistinct, nonvisible] [N/A:N/A] Granulation Amount: [1:None Present (0%)] [N/A:N/A] Necrotic Amount: [1:Large (67-100%)] [N/A:N/A] Necrotic Tissue: [1:Eschar] [N/A:N/A] Exposed Structures: [1:Fascia: No Fat: No Tendon: No Muscle: No Joint: No Bone: No Limited to Skin Breakdown] [N/A:N/A] Epithelialization: None N/A N/A Periwound Skin Texture: Edema: No N/A N/A Excoriation: No Induration: No Callus: No Crepitus: No Fluctuance: No Friable: No Rash: No Scarring: No Periwound Skin Moist: Yes N/A N/A Moisture: Dry/Scaly: Yes Maceration: No Periwound Skin Color: Atrophie Blanche: No N/A N/A Cyanosis: No Ecchymosis: No Erythema: No Hemosiderin Staining: No Mottled: No Pallor: No Rubor: No Temperature: Cool/Cold N/A N/A Tenderness  on Yes N/A N/A Palpation: Wound Preparation: Ulcer Cleansing: N/A N/A Rinsed/Irrigated with Saline Topical Anesthetic Applied: Other: Lidocaine 4% Treatment Notes Electronic Signature(s) Signed: 06/10/2016 5:43:53 PM By: Jordan Eric BSN, Hopkins Entered By: Jordan Eric on 06/10/2016 09:04:35 Jordan Banda ([id number]) -------------------------------------------------------------------------------- Multi-Disciplinary Care Plan Details [name], WILFORD. Date of Service: 06/10/2016 8:45 AM Medical Record Patient Account Number: [id number] Number: Treating Hopkins: Jordan Mealy, Hopkins, BSN, Hopkins 08-15-1931 [phone] y.o. Other Clinician: Date of Birth/Sex: Female) Treating ROBSON,  Hopkins Primary Care Physician: Jordan Begin Physician/Extender: G Referring Physician: Guy Begin Weeks in Treatment: 0 Active Inactive Orientation to the Wound Care Program Nursing Diagnoses: Knowledge deficit related to the wound healing center program Goals: Patient/caregiver will verbalize understanding of the Wound Healing Center Program Date Initiated: 06/10/2016 Goal Status: Active Interventions: Provide education on orientation to the wound center Notes: Venous Leg Ulcer Nursing Diagnoses: Potential for venous Insuffiency (use before diagnosis confirmed) Goals: Non-invasive venous studies are completed as ordered Date Initiated: 06/10/2016 Goal Status: Active Patient will maintain optimal edema control Date Initiated: 06/10/2016 Goal Status: Active Patient/caregiver will verbalize understanding of disease process and disease management Date Initiated: 06/10/2016 Goal Status: Active Verify adequate tissue perfusion prior to therapeutic compression application Date Initiated: 06/10/2016 Goal Status: Active Interventions: [name], MISSEY. ([id number]) Assess peripheral edema status every visit. Compression as ordered Provide education on venous insufficiency Treatment Activities: Non-invasive vascular studies : 06/10/2016 Notes: Wound/Skin Impairment Nursing Diagnoses: Impaired tissue integrity Knowledge deficit related to ulceration/compromised skin integrity Goals: Patient/caregiver will verbalize understanding of skin care regimen Date Initiated: 06/10/2016 Goal Status: Active Ulcer/skin breakdown will have a volume reduction of 30% by week 4 Date Initiated: 06/10/2016 Goal Status: Active Ulcer/skin breakdown will have a volume reduction of 50% by week 8 Date Initiated: 06/10/2016 Goal Status: Active Ulcer/skin breakdown will have a volume reduction of 80% by week 12 Date Initiated: 06/10/2016 Goal Status: Active Ulcer/skin breakdown will heal within 14  weeks Date Initiated: 06/10/2016 Goal Status: Active Interventions: Assess patient/caregiver ability to obtain necessary supplies Assess patient/caregiver ability to perform ulcer/skin care regimen upon admission and as needed Assess ulceration(s) every visit Provide education on ulcer and skin care Treatment Activities: Skin care regimen initiated : 06/10/2016 Topical wound management initiated : 06/10/2016 Notes: Electronic Signature(s) [name], [name] ([id number]) Signed: 06/10/2016 5:43:53 PM By: Jordan Eric BSN, Hopkins Entered By: Jordan Eric on 06/10/2016 09:04:24 Garza, Gardiner Hopkins ([id number]) -------------------------------------------------------------------------------- Pain Assessment Details Patient Name: Jordan Banda. Date of Service: 06/10/2016 8:45 AM Medical Record Patient Account Number: [id number] Number: Treating Hopkins: Jordan Mealy, Hopkins, BSN, Hopkins 1930-12-22 [phone] y.o. Other Clinician: Date of Birth/Sex: Female) Treating [name] Primary Care Physician: Jordan Begin Physician/Extender: G Referring Physician: Guy Begin Weeks in Treatment: 0 Active Problems Location of Pain Severity and Description of Pain Patient Has Paino No Site Locations With Dressing Change: No Pain Management and Medication Current Pain Management: Electronic Signature(s) Signed: 06/10/2016 5:43:53 PM By: Jordan Eric BSN, Hopkins Entered By: Jordan Eric on 06/10/2016 08:44:03 Clearman, Gardiner Hopkins ([id number]) -------------------------------------------------------------------------------- Patient/Caregiver Education Details Patient Name: Jordan Banda. Date of Service: 06/10/2016 8:45 AM Medical Record Patient Account Number: [id number] Number: Treating Hopkins: Jordan Mealy, Hopkins, BSN, Hopkins [birth year] [phone] y.o. Other Clinician: Date of Birth/Gender: Female) Treating [name] Primary Care Physician: Jordan Begin Physician/Extender: G Referring Physician: Gabriel Hopkins  in Treatment: 0 Education Assessment Education Provided To: Patient Education Topics Provided Venous: Methods: Explain/Verbal Responses: State content correctly Welcome To The Wound Care Center: Methods:  Explain/Verbal Responses: State content correctly Wound/Skin Impairment: Methods: Explain/Verbal Responses: State content correctly Electronic Signature(s) Signed: 06/10/2016 5:43:53 PM By: [name] Entered By: Jordan EricAfful, Hopkins on 06/10/2016 09:42:49 Smock, [name] ([id number]) -------------------------------------------------------------------------------- Wound Assessment Details [name], Tomeca J. Date of Service: 06/10/2016 8:45 AM Medical Record Patient Account Number: [id number] Number: Treating Hopkins: [name] 1931-01-24 [phone](82 y.o. Other Clinician: Date of Birth/Sex: Female) Treating [name] Primary Care Physician: [name] Physician/Extender: G Referring Physician: [name], Hopkins Weeks in Treatment: 0 Wound Status Wound Number: 1 Primary Venous Leg Ulcer Etiology: Wound Location: Right, Medial Lower Leg Wound Status: Open Wounding Event: Gradually Appeared Comorbid Cataracts, Arrhythmia, Hypertension, Date Acquired: 06/01/2016 History: Osteoarthritis Weeks Of Treatment: 0 Clustered Wound: No Photos Photo Uploaded By: Jordan EricAfful, Hopkins on 06/10/2016 17:28:40 Wound Measurements Length: (cm) 8 Width: (cm) 5 Depth: (cm) 0.1 Area: (cm) 31.416 Volume: (cm) 3.142 % Reduction in Area: -899.9% % Reduction in Volume: -900.6% Epithelialization: None Tunneling: No Undermining: No Wound Description Classification: Partial Thickness Foul Odor Aft Wound Margin: Indistinct, nonvisible Exudate Amount: Small [name], Travonna J. ([id number]) er Cleansing: No Wound Bed Granulation Amount: None Present (0%) Exposed Structure Necrotic Amount: Large (67-100%) Fascia Exposed: No Necrotic Quality: Eschar Fat Layer Exposed:  No Tendon Exposed: No Muscle Exposed: No Joint Exposed: No Bone Exposed: No Limited to Skin Breakdown Periwound Skin Texture Texture Color No Abnormalities Noted: No No Abnormalities Noted: No Callus: No Atrophie Blanche: No Crepitus: No Cyanosis: No Excoriation: No Ecchymosis: No Fluctuance: No Erythema: No Friable: No Hemosiderin Staining: No Induration: No Mottled: No Localized Edema: No Pallor: No Rash: No Rubor: No Scarring: No Temperature / Pain Moisture Temperature: Cool/Cold No Abnormalities Noted: No Tenderness on Palpation: Yes Dry / Scaly: Yes Maceration: No Moist: Yes Wound Preparation Ulcer Cleansing: Rinsed/Irrigated with Saline Topical Anesthetic Applied: Other: Lidocaine 4%, Treatment Notes Wound #1 (Right, Medial Lower Leg) 1. Cleansed with: Clean wound with Normal Saline 3. Peri-wound Care: Barrier cream 4. Dressing Applied: Aquacel Ag 5. Secondary Dressing Applied ABD Pad 7. Secured with Other (specify in notes) Notes [name], Analiya J. ([id number]) Kerlix and Crissie Sicklescoban Electronic Signature(s) Signed: 06/10/2016 5:43:53 PM By: [name] Entered By: Jordan EricAfful, Hopkins on 06/10/2016 09:19:24 [name], Iyauna J. ([id number]) -------------------------------------------------------------------------------- Vitals Details [name], Anhar J. Date of Service: 06/10/2016 8:45 AM Medical Record Patient Account Number: [id number] Number: Treating Hopkins: [name] 1931-01-24 [phone](82 y.o. Other Clinician: Date of Birth/Sex: Female) Treating [name] Primary Care Physician: [name] Physician/Extender: G Referring Physician: [name], Hopkins Weeks in Treatment: 0 Vital Signs Time Taken: 08:45 Temperature (F): 97.7 Height (in): 61 Pulse (bpm): 64 Source: Stated Respiratory Rate (breaths/min): 16 Weight (lbs): 170 Blood Pressure (mmHg): 146/65 Source: Measured Reference Range: 80 - 120 mg / dl Body Mass  Index (BMI): 32.1 Electronic Signature(s) Signed: 06/10/2016 5:43:53 PM By: [name] Entered By: Jordan EricAfful, Hopkins on 06/10/2016 08:50:21

## 2016-06-11 NOTE — Progress Notes (Signed)
[name], Jordan J. ([id number]) Visit Report for 06/10/2016 Abuse/Suicide Risk Screen Details [name], Virgia J. Date of Service: 06/10/2016 8:45 AM Medical Record Patient Account Number: [id number] Number: Treating RN: Clover MealyAfful, RN, BSN, Rita [birth year] [phone](82 y.o. Other Clinician: Date of Birth/Sex: Female) Treating ROBSON, MICHAEL Primary Care Physician/Extender: Letitia LibraG FARAHI, NARGES Physician: Referring Physician: [name], NARGES Weeks in Treatment: 0 Abuse/Suicide Risk Screen Items Answer ABUSE/SUICIDE RISK SCREEN: Has anyone close to you tried to hurt or harm you recentlyo No Do you feel uncomfortable with anyone in your familyo No Has anyone forced you do things that you didnot want to doo No Do you have any thoughts of harming yourselfo No Patient displays signs or symptoms of abuse and/or neglect. No Electronic Signature(s) Signed: 06/10/2016 5:43:53 PM By: Elpidio EricAfful, Rita BSN, RN Entered By: Elpidio EricAfful, Rita on 06/10/2016 08:48:39 Grizzell, [name] ([id number]) -------------------------------------------------------------------------------- Activities of Daily Living Details [name], Special J. Date of Service: 06/10/2016 8:45 AM Medical Record Patient Account Number: [id number] Number: Treating RN: Clover MealyAfful, RN, BSN, Rita [birth year] [phone](82 y.o. Other Clinician: Date of Birth/Sex: Female) Treating ROBSON, MICHAEL Primary Care Physician/Extender: Letitia LibraG FARAHI, NARGES Physician: Referring Physician: [name], NARGES Weeks in Treatment: 0 Activities of Daily Living Items Answer Activities of Daily Living (Please select one for each item) Drive Automobile Not Able Take Medications Need Assistance Use Telephone Completely Able Care for Appearance Completely Able Use Toilet Completely Able Bath / Shower Completely Able Dress Self Completely Able Feed Self Completely Able Walk Completely Able Get In / Out Bed Completely Able Housework Completely  Able Prepare Meals Completely Able Handle Money Completely Able Shop for Self Need Assistance Electronic Signature(s) Signed: 06/10/2016 5:43:53 PM By: Elpidio EricAfful, Rita BSN, RN Entered By: Elpidio EricAfful, Rita on 06/10/2016 08:47:28 Caamano, [name] ([id number]) -------------------------------------------------------------------------------- Education Assessment Details [name], Jordan J. Date of Service: 06/10/2016 8:45 AM Medical Record Patient Account Number: [id number] Number: Treating RN: Clover MealyAfful, RN, BSN, Rita [birth year] [phone](82 y.o. Other Clinician: Date of Birth/Sex: Female) Treating ROBSON, MICHAEL Primary Care Physician/Extender: Letitia LibraG FARAHI, NARGES Physician: Referring Physician: Gabriel RungFARAHI, NARGES Weeks in Treatment: 0 Primary Learner Assessed: Patient Learning Preferences/Education Level/Primary Language Learning Preference: Explanation Highest Education Level: High School Preferred Language: English Cognitive Barrier Assessment/Beliefs Language Barrier: No Physical Barrier Assessment Impaired Vision: Yes Glasses Impaired Hearing: No Decreased Hand dexterity: No Knowledge/Comprehension Assessment Knowledge Level: Medium Comprehension Level: Medium Ability to understand written Medium instructions: Ability to understand verbal Medium instructions: Motivation Assessment Anxiety Level: Calm Cooperation: Cooperative Education Importance: Acknowledges Need Interest in Health Problems: Asks Questions Perception: Coherent Willingness to Engage in Self- Medium Management Activities: Readiness to Engage in Self- Medium Management Activities: Electronic Signature(s) Signed: 06/10/2016 5:43:53 PM By: Elpidio EricAfful, Rita BSN, RN Offer, Fleurette Shela CommonsJ. ([id number]) Entered By: Elpidio EricAfful, Rita on 06/10/2016 08:47:54 Wik, [name] ([id number]) -------------------------------------------------------------------------------- Fall Risk Assessment Details [name], Jordan  J. Date of Service: 06/10/2016 8:45 AM Medical Record Patient Account Number: [id number] Number: Treating RN: Clover MealyAfful, RN, BSN, Rita [birth year] [phone](82 y.o. Other Clinician: Date of Birth/Sex: Female) Treating ROBSON, MICHAEL Primary Care Physician/Extender: Letitia LibraG FARAHI, NARGES Physician: Referring Physician: [name], NARGES Weeks in Treatment: 0 Fall Risk Assessment Items Have you had 2 or more falls in the last 12 monthso 0 No Have you had any fall that resulted in injury in the last 12 monthso 0 No FALL RISK ASSESSMENT: History of falling - immediate or within 3 months 0 No Secondary diagnosis 0 No Ambulatory aid None/bed rest/wheelchair/nurse 0 Yes Crutches/cane/walker 0  No Furniture 0 No IV Access/Saline Lock 0 No Gait/Training Normal/bed rest/immobile 0 Yes Weak 10 Yes Impaired 20 Yes Mental Status Oriented to own ability 0 Yes Electronic Signature(s) Signed: 06/10/2016 5:43:53 PM By: Elpidio EricAfful, Rita BSN, RN Entered By: Elpidio EricAfful, Rita on 06/10/2016 08:48:10 Davee, [name] ([id number]) -------------------------------------------------------------------------------- Foot Assessment Details [name], Jordan J. Date of Service: 06/10/2016 8:45 AM Medical Record Patient Account Number: [id number] Number: Treating RN: Clover MealyAfful, RN, BSN, Rita [birth year] [phone](82 y.o. Other Clinician: Date of Birth/Sex: Female) Treating ROBSON, MICHAEL Primary Care Physician/Extender: Letitia LibraG FARAHI, NARGES Physician: Referring Physician: [name], NARGES Weeks in Treatment: 0 Foot Assessment Items Site Locations + = Sensation present, - = Sensation absent, C = Callus, U = Ulcer R = Redness, W = Warmth, M = Maceration, PU = Pre-ulcerative lesion F = Fissure, S = Swelling, D = Dryness Assessment Right: Left: Other Deformity: No No Prior Foot Ulcer: No No Prior Amputation: No No Charcot Joint: No No Ambulatory Status: Ambulatory With Help Assistance Device: Cane Gait:  Surveyor, miningUnsteady Electronic Signature(s) Signed: 06/10/2016 5:43:53 PM By: Elpidio EricAfful, Rita BSN, RN Faughnan, [name] ([id number]) Entered By: Elpidio EricAfful, Rita on 06/10/2016 08:48:29 Luten, [name] ([id number]) -------------------------------------------------------------------------------- Nutrition Risk Assessment Details [name], Janay J. Date of Service: 06/10/2016 8:45 AM Medical Record Patient Account Number: [id number] Number: Treating RN: Clover MealyAfful, RN, BSN, Rita [birth year] [phone](82 y.o. Other Clinician: Date of Birth/Sex: Female) Treating ROBSON, MICHAEL Primary Care Physician/Extender: Letitia LibraG FARAHI, NARGES Physician: Referring Physician: [name], NARGES Weeks in Treatment: 0 Height (in): 61 Weight (lbs): 170 Body Mass Index (BMI): 32.1 Nutrition Risk Assessment Items NUTRITION RISK SCREEN: I have an illness or condition that made me change the kind and/or 0 No amount of food I eat I eat fewer than two meals per day 0 No I eat few fruits and vegetables, or milk products 0 No I have three or more drinks of beer, liquor or wine almost every day 0 No I have tooth or mouth problems that make it hard for me to eat 0 No I don't always have enough money to buy the food I need 0 No I eat alone most of the time 0 No I take three or more different prescribed or over-the-counter drugs a 0 No day Without wanting to, I have lost or gained 10 pounds in the last six 0 No months I am not always physically able to shop, cook and/or feed myself 0 No Nutrition Protocols Good Risk Protocol 0 No interventions needed Moderate Risk Protocol Electronic Signature(s) Signed: 06/10/2016 5:43:53 PM By: Elpidio EricAfful, Rita BSN, RN Entered By: Elpidio EricAfful, Rita on 06/10/2016 08:48:19

## 2016-06-12 ENCOUNTER — Other Ambulatory Visit: Payer: Self-pay | Admitting: Family Medicine

## 2016-06-12 DIAGNOSIS — M7989 Other specified soft tissue disorders: Secondary | ICD-10-CM

## 2016-06-17 ENCOUNTER — Ambulatory Visit
Admission: RE | Admit: 2016-06-17 | Discharge: 2016-06-17 | Disposition: A | Discharge status: Home / Self Care | Payer: Medicare Other | Source: Ambulatory Visit | Attending: Family Medicine | Admitting: Family Medicine

## 2016-06-17 ENCOUNTER — Encounter: Payer: Medicare Other | Attending: Internal Medicine | Admitting: Internal Medicine

## 2016-06-17 DIAGNOSIS — L97211 Non-pressure chronic ulcer of right calf limited to breakdown of skin: Secondary | ICD-10-CM | POA: Diagnosis not present

## 2016-06-17 DIAGNOSIS — M7122 Synovial cyst of popliteal space [Baker], left knee: Secondary | ICD-10-CM | POA: Diagnosis not present

## 2016-06-17 DIAGNOSIS — M7989 Other specified soft tissue disorders: Secondary | ICD-10-CM | POA: Diagnosis present

## 2016-06-17 DIAGNOSIS — I10 Essential (primary) hypertension: Secondary | ICD-10-CM | POA: Insufficient documentation

## 2016-06-17 DIAGNOSIS — M1990 Unspecified osteoarthritis, unspecified site: Secondary | ICD-10-CM | POA: Insufficient documentation

## 2016-06-17 DIAGNOSIS — L03115 Cellulitis of right lower limb: Secondary | ICD-10-CM | POA: Insufficient documentation

## 2016-06-17 DIAGNOSIS — I70232 Atherosclerosis of native arteries of right leg with ulceration of calf: Secondary | ICD-10-CM | POA: Diagnosis not present

## 2016-06-18 NOTE — Progress Notes (Signed)
[name], Chee J. ([id number]) Visit Report for 06/17/2016 Chief Complaint Document Details [name], Jordan J. Date of Service: 06/17/2016 2:15 PM Medical Record Patient Account Number: [id number] Number: Treating RN: Clover MealyAfful, RN, BSN, Rita 10/30/1931 [phone](82 y.o. Other Clinician: Date of Birth/Sex: Female) Treating Vadhir Mcnay Primary Care Physician/Extender: Letitia LibraG FARAHI, NARGES Physician: Referring Physician: [name], NARGES Weeks in Treatment: 1 Information Obtained from: Patient Chief Complaint 06/10/16; the patient is here for review of a wound on the right anterior medial lower leg above the ankle Electronic Signature(s) Signed: 06/18/2016 7:30:59 AM By: Baltazar Najjarobson, Eliazar Olivar MD Entered By: Baltazar Najjarobson, Ahley Bulls on 06/17/2016 15:01:47 Lazcano, [name] ([id number]) -------------------------------------------------------------------------------- HPI Details [name], Jordan J. Date of Service: 06/17/2016 2:15 PM Medical Record Patient Account Number: [id number] Number: Treating RN: Clover MealyAfful, RN, BSN, Rita 10/30/1931 [phone](82 y.o. Other Clinician: Date of Birth/Sex: Female) Treating Gessica Jawad Primary Care Physician/Extender: Letitia LibraG FARAHI, NARGES Physician: Referring Physician: [name], NARGES Weeks in Treatment: 1 History of Present Illness HPI Description: 06/10/16; this is an 82 year old woman [place]. She saw her primary doctor roughly over a week ago and was referred to the ER for a cellulitis in the right medial leg. She was given a round of IV antibiotics in the emergency room and discharged on oral Keflex which she is taking. She is been left with a uncomfortable leg area on the right medial lower leg. Her ABIs in this clinic were 0.8 on the right 0.6 on the left. She does not describe claudication. She is not a diabetic. Lab work in the ER showed a normal BUN and creatinine on 6/20 at 18 and 0.92 respectively white count  was 10.1 hemoglobin at 13.8. She did not have any imaging studies. I can see no vascular evaluations in Epic. She does not have a prior wound history 06/17/16; the area affected on her right medial ankle is an area I thought was due to tissue damage from cellulitis last week. She is going for venous reflux studies this afternoon, these were not ordered in this clinic at least not by me. The patient does not give a prior history of damage to the skin in this area although I wonder if she actually might not of noticed it. We have been using's Aquacel Ag under a wrap although we will not be able to wrap her today Electronic Signature(s) Signed: 06/18/2016 7:30:59 AM By: Baltazar Najjarobson, Lamyia Cdebaca MD Entered By: Baltazar Najjarobson, Syler Norcia on 06/17/2016 15:03:22 [name], Estela J. ([id number]) -------------------------------------------------------------------------------- Physical Exam Details [name], [name] Date of Service: 06/17/2016 2:15 PM Medical Record Patient Account Number: [id number] Number: Treating RN: Clover MealyAfful, RN, BSN, Rita 10/30/1931 [phone](82 y.o. Other Clinician: Date of Birth/Sex: Female) Treating Fred Hammes Primary Care Physician/Extender: Letitia LibraG FARAHI, NARGES Physician: Referring Physician: [name], NARGES Weeks in Treatment: 1 Notes Wound exam; the area in question is still on the right medial leg give. She does have chronic venous insufficiency with areas of denuded skin, tissue breakdown. Did not attempt to debridement this today. I I would wonder if this has been there more chronically than she was aware until she developed a secondary area of cellulitis. There is no area of active infection at present. Electronic Signature(s) Signed: 06/18/2016 7:30:59 AM By: Baltazar Najjarobson, Brae Gartman MD Entered By: Baltazar Najjarobson, Kimberley Speece on 06/17/2016 15:04:51 Waterhouse, [name] ([id number]) -------------------------------------------------------------------------------- Physician Orders Details Patient Name:  [name], Jordan J. Date of Service: 06/17/2016 2:15 PM Medical Record Patient Account Number: [id number] Number: Treating RN: Afful,  RN, BSN, Hartley Sink Jan 29, 1931 (82 y.o. Other Clinician: Date of Birth/Sex: Female) Treating Jilliam Bellmore Primary Care Physician/Extender: Letitia Libra, NARGES Physician: Referring Physician: [name] in Treatment: 1 Verbal / Phone Orders: Yes Clinician: Afful, RN, BSN, Rita Read Back and Verified: Yes Diagnosis Coding Wound Cleansing Wound #1 Right,Medial Lower Leg o Cleanse wound with mild soap and water o May shower with protection. Skin Barriers/Peri-Wound Care Wound #1 Right,Medial Lower Leg o Other: - TCA Primary Wound Dressing Wound #1 Right,Medial Lower Leg o Aquacel Ag Secondary Dressing Wound #1 Right,Medial Lower Leg o Dry Gauze o Boardered Foam Dressing Edema Control Wound #1 Right,Medial Lower Leg o Tubigrip Additional Orders / Instructions Wound #1 Right,Medial Lower Leg o Increase protein intake. o Activity as tolerated Electronic Signature(s) Signed: 06/17/2016 5:42:05 PM By: Elpidio Eric BSN, RN Signed: 06/18/2016 7:30:59 AM By: Baltazar Najjar MD Entered By: Elpidio Eric on 06/17/2016 14:33:51 SHARONA, ROVNER ([id number]) KARLEE, STAFF ([id number]) -------------------------------------------------------------------------------- Problem List Details Patient Name: KAELEI, Hopkins. Date of Service: 06/17/2016 2:15 PM Medical Record Patient Account Number: [id number] Number: Treating RN: Clover Mealy, RN, BSN, Rita 1930/12/25 [phone] y.o. 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 Date of Service: 06/17/2016 2:15 PM Medical Record Patient Account Number: [id number] Number: Treating RN: Clover Mealy, RN, BSN, Rita [birth year] [phone] y.o. Other Clinician: Date of Birth/Sex: Female) Treating Benford Asch Primary Care Physician/Extender: Letitia Libra, NARGES Physician: Referring Physician: Guy Begin Weeks in Treatment: 1 Subjective Chief Complaint Information obtained from Patient 06/10/16; the patient is here for review of a wound on the right anterior medial lower leg above the ankle History of Present Illness (HPI) 06/10/16; this is an 82 year old woman who lives near [place] with family members. She saw her primary doctor roughly over a week ago and was referred to the ER for a cellulitis in the right medial leg. She was given a round of IV antibiotics in the emergency room and discharged on oral Keflex which she is taking. She is been left with a uncomfortable leg area on the right medial lower leg. Her ABIs in this clinic were 0.8 on the right 0.6 on the left. She does not describe claudication. She is not a diabetic. Lab work in the ER showed a normal BUN and creatinine on 6/20 at 18 and 0.92 respectively white count was 10.1 hemoglobin at 13.8. She did not have any imaging studies. I can see no vascular evaluations in Epic. She does not have a prior wound history 06/17/16; the area affected on her right medial ankle is an area I thought was due to tissue damage from cellulitis last week.  She is going for venous reflux studies this afternoon, these were not ordered in this clinic at least not by me. The patient does not give a prior history of damage to the skin in this area although I wonder if she actually might not of noticed it. We have been using's  Aquacel Ag under a wrap although we will not be able to wrap her today Objective Constitutional Vitals Time Taken: 2:12 PM, Height: 61 in, Weight: 170 lbs, BMI: 32.1, Temperature: 98.5 F, Pulse: 84 bpm, Respiratory Rate: 17 breaths/min, Blood Pressure: 119/67 mmHg. Integumentary (Hair, Skin) Wound #1 status is Open. Original cause of wound was Gradually Appeared. The wound is located on the Right,Medial Lower Leg. The wound measures 7cm length x 4cm width x 0.1cm depth; 21.991cm^2 area Everage, Faithe J. ([id number]) and 2.199cm^3 volume. The wound is limited to skin breakdown. There is no tunneling or undermining noted. There is a medium amount of drainage noted. The wound margin is indistinct and nonvisible. There is medium (34-66%) pink, pale granulation within the wound bed. There is a medium (34-66%) amount of necrotic tissue within the wound bed including Eschar. The periwound skin appearance exhibited: Dry/Scaly, Moist. The periwound skin appearance did not exhibit: Callus, Crepitus, Excoriation, Fluctuance, Friable, Induration, Localized Edema, Rash, Scarring, Maceration, Atrophie Blanche, Cyanosis, Ecchymosis, Hemosiderin Staining, Mottled, Pallor, Rubor, Erythema. Periwound temperature was noted as Cool/Cold. The periwound has tenderness on palpation. 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 Skin  Barriers/Peri-Wound Care: Wound #1 Right,Medial Lower Leg: Other: - TCA Primary Wound Dressing: Wound #1 Right,Medial Lower Leg: Aquacel Ag Secondary Dressing: Wound #1 Right,Medial Lower Leg: Dry Gauze Boardered Foam Dressing Edema Control: Wound #1 Right,Medial Lower Leg: Tubigrip Additional Orders / Instructions: Wound #1 Right,Medial Lower Leg: Increase protein intake. Activity as tolerated Markos, Leeloo J. ([id number]) #1 we'll attempt to wrap this area and leave the rest of her leg open for venous reflux studies that were previously ordered. #2 TCA, silver alginate Kerlix Coban Electronic Signature(s) Signed: 06/18/2016 7:30:59 AM By: Baltazar Najjarobson, Robbi Spells MD Entered By: Baltazar Najjarobson, Cypress Fanfan on 06/17/2016 15:05:47 Wurster, [name] ([id number]) -------------------------------------------------------------------------------- SuperBill Details [name], Payten J. Date of Service: 06/17/2016 Medical Record Patient Account Number: [id number] Number: Treating RN: Clover MealyAfful, RN, BSN, Avery Sinkita 07/23/1931 [phone](82 y.o. Other Clinician: Date of Birth/Sex: Female) Treating Kearie Mennen Primary Care Physician/Extender: Letitia LibraG FARAHI, NARGES Physician: Weeks in Treatment: 1 Referring Physician: [name], NARGES Diagnosis Coding ICD-10 Codes Code Description 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down of skin L03.115 Cellulitis of right lower limb I70.232 Atherosclerosis of native arteries of right leg with ulceration of calf Facility Procedures CPT4 Code: [id number] Description: [id number] - WOUND CARE VISIT-LEV 2 EST PT Modifier: Quantity: 1 Electronic Signature(s) Signed: 06/18/2016 7:30:59 AM By: Baltazar Najjarobson, Uchechukwu Dhawan MD Entered By: Baltazar Najjarobson, Cayci Mcnabb on 06/17/2016 15:05:57

## 2016-06-18 NOTE — Progress Notes (Signed)
Jordan, Hopkins ([id number]) Visit Report for 06/17/2016 Arrival Information Details [name], [name]. Date of Service: 06/17/2016 2:15 PM Medical Record Patient Account Number: [id number] Number: Treating RN: Clover Mealy, RN, BSN, Rita December 11, 1931 [phone] y.o. Other Clinician: Date of Birth/Sex: Female) Treating ROBSON, MICHAEL Primary Care Physician: Guy Begin Physician/Extender: G Referring Physician: Guy Begin Weeks in Treatment: 1 Visit Information History Since Last Visit Added or deleted any medications: No Patient Arrived: Cane Any new allergies or adverse reactions: No Arrival Time: 14:04 Had a fall or experienced change in No Accompanied By: self activities of daily living that may affect Transfer Assistance: None risk of falls: Patient Identification Verified: Yes Signs or symptoms of abuse/neglect since last No Secondary Verification Process Completed: Yes visito Patient Requires Transmission-Based No Hospitalized since last visit: No Precautions: Has Dressing in Place as Prescribed: Yes Patient Has Alerts: No Has Compression in Place as Prescribed: Yes Pain Present Now: No Electronic Signature(s) Signed: 06/17/2016 5:42:05 PM By: Elpidio Eric BSN, RN Entered By: Elpidio Eric on 06/17/2016 14:04:27 Vinas, [name] ([id number]) -------------------------------------------------------------------------------- Clinic Level of Care Assessment Details [name] Date of Service: 06/17/2016 2:15 PM Medical Record Patient Account Number: [id number] Number: Treating RN: Clover Mealy, RN, BSN, Rita 12-15-1930 [phone] y.o. Other Clinician: Date of Birth/Sex: Female) Treating ROBSON, MICHAEL Primary Care Physician: Guy Begin Physician/Extender: G Referring Physician: [name] in Treatment: 1 Clinic Level of Care Assessment Items TOOL 4 Quantity Score []  - Use when only an EandM is performed on FOLLOW-UP visit  0 ASSESSMENTS - Nursing Assessment / Reassessment X - Reassessment of Co-morbidities (includes updates in patient status) 1 10 X - Reassessment of Adherence to Treatment Plan 1 5 ASSESSMENTS - Wound and Skin Assessment / Reassessment X - Simple Wound Assessment / Reassessment - one wound 1 5 []  - Complex Wound Assessment / Reassessment - multiple wounds 0 []  - Dermatologic / Skin Assessment (not related to wound area) 0 ASSESSMENTS - Focused Assessment []  - Circumferential Edema Measurements - multi extremities 0 []  - Nutritional Assessment / Counseling / Intervention 0 X - Lower Extremity Assessment (monofilament, tuning fork, pulses) 1 5 []  - Peripheral Arterial Disease Assessment (using hand held doppler) 0 ASSESSMENTS - Ostomy and/or Continence Assessment and Care []  - Incontinence Assessment and Management 0 []  - Ostomy Care Assessment and Management (repouching, etc.) 0 PROCESS - Coordination of Care X - Simple Patient / Family Education for ongoing care 1 15 []  - Complex (extensive) Patient / Family Education for ongoing care 0 []  - Staff obtains Chiropractor, Records, Test Results / Process Orders 0 []  - Staff telephones HHA, Nursing Homes / Clarify orders / etc 0 Ouch, [place] J. ([id number]) []  - Routine Transfer to another Facility (non-emergent condition) 0 []  - Routine Hospital Admission (non-emergent condition) 0 []  - New Admissions / Manufacturing engineer / Ordering NPWT, Apligraf, etc. 0 []  - Emergency Hospital Admission (emergent condition) 0 []  - Simple Discharge Coordination 0 []  - Complex (extensive) Discharge Coordination 0 PROCESS - Special Needs []  - Pediatric / Minor Patient Management 0 []  - Isolation Patient Management 0 []  - Hearing / Language / Visual special needs 0 []  - Assessment of Community assistance (transportation, D/C planning, etc.) 0 []  - Additional assistance / Altered mentation 0 []  - Support Surface(s) Assessment (bed, cushion, seat, etc.)  0 INTERVENTIONS - Wound Cleansing / Measurement X - Simple Wound Cleansing - one wound 1 5 []  - Complex Wound Cleansing - multiple wounds 0  X - Wound Imaging (photographs - any number of wounds) 1 5 []  - Wound Tracing (instead of photographs) 0 X - Simple Wound Measurement - one wound 1 5 []  - Complex Wound Measurement - multiple wounds 0 INTERVENTIONS - Wound Dressings X - Small Wound Dressing one or multiple wounds 1 10 []  - Medium Wound Dressing one or multiple wounds 0 []  - Large Wound Dressing one or multiple wounds 0 []  - Application of Medications - topical 0 []  - Application of Medications - injection 0 Kos, Alantis J. ([id number]) INTERVENTIONS - Miscellaneous []  - External ear exam 0 []  - Specimen Collection (cultures, biopsies, blood, body fluids, etc.) 0 []  - Specimen(s) / Culture(s) sent or taken to Lab for analysis 0 []  - Patient Transfer (multiple staff / Michiel Sites Lift / Similar devices) 0 []  - Simple Staple / Suture removal (25 or less) 0 []  - Complex Staple / Suture removal (26 or more) 0 []  - Hypo / Hyperglycemic Management (close monitor of Blood Glucose) 0 []  - Ankle / Brachial Index (ABI) - do not check if billed separately 0 X - Vital Signs 1 5 Has the patient been seen at the hospital within the last three years: Yes Total Score: 70 Level Of Care: New/Established - Level 2 Electronic Signature(s) Signed: 06/17/2016 5:42:05 PM By: Elpidio Eric BSN, RN Entered By: Elpidio Eric on 06/17/2016 14:34:27 Monsour, [name] ([id number]) -------------------------------------------------------------------------------- Encounter Discharge Information Details [name] Date of Service: 06/17/2016 2:15 PM Medical Record Patient Account Number: [id number] Number: Treating RN: Clover Mealy, RN, BSN, Rita 03/06/1931 [phone] y.o. Other Clinician: Date of Birth/Sex: Female) Treating ROBSON, MICHAEL Primary Care Physician: Guy Begin Physician/Extender:  G Referring Physician: [name] in Treatment: 1 Encounter Discharge Information Items Discharge Pain Level: 0 Discharge Condition: Stable Ambulatory Status: Cane Discharge Destination: Home Private Transportation: Auto Accompanied By: self Schedule Follow-up Appointment: No Medication Reconciliation completed and No provided to Patient/Care Lourie Retz: Clinical Summary of Care: Electronic Signature(s) Signed: 06/17/2016 5:42:05 PM By: Elpidio Eric BSN, RN Entered By: Elpidio Eric on 06/17/2016 14:35:54 Mcilhenny, [name] ([id number]) -------------------------------------------------------------------------------- Lower Extremity Assessment Details [name] Date of Service: 06/17/2016 2:15 PM Medical Record Patient Account Number: [id number] Number: Treating RN: Clover Mealy, RN, BSN, Rita 02-09-1931 [phone] y.o. Other Clinician: Date of Birth/Sex: Female) Treating ROBSON, MICHAEL Primary Care Physician: Guy Begin Physician/Extender: G Referring Physician: Guy Begin Weeks in Treatment: 1 Vascular Assessment Pulses: Posterior Tibial Dorsalis Pedis Palpable: [Right:Yes] Extremity colors, hair growth, and conditions: Extremity Color: [Right:Hyperpigmented] Hair Growth on Extremity: [Right:No] Temperature of Extremity: [Right:Warm] Capillary Refill: [Right:< 3 seconds] Toe Nail Assessment Left: Right: Thick: Yes Discolored: Yes Deformed: No Improper Length and Hygiene: No Electronic Signature(s) Signed: 06/17/2016 5:42:05 PM By: Elpidio Eric BSN, RN Entered By: Elpidio Eric on 06/17/2016 14:15:15 Tabora, [name] ([id number]) -------------------------------------------------------------------------------- Multi Wound Chart Details [name] Date of Service: 06/17/2016 2:15 PM Medical Record Patient Account Number: [id number] Number: Treating RN: Clover Mealy, RN, BSN, Rita 02/08/1931 [phone] y.o. Other  Clinician: Date of Birth/Sex: Female) Treating ROBSON, MICHAEL Primary Care Physician: Guy Begin Physician/Extender: G Referring Physician: Guy Begin Weeks in Treatment: 1 Vital Signs Height(in): 61 Pulse(bpm): 84 Weight(lbs): 170 Blood Pressure 119/67 (mmHg): Body Mass Index(BMI): 32 Temperature(F): 98.5 Respiratory Rate 17 (breaths/min): Photos: [1:No Photos] [N/A:N/A] Wound Location: [1:Right Lower Leg - Medial] [N/A:N/A] Wounding Event: [1:Gradually Appeared] [N/A:N/A] Primary Etiology: [1:Venous Leg Ulcer] [N/A:N/A] Comorbid History: [1:Cataracts, Arrhythmia, Hypertension, Osteoarthritis] [N/A:N/A] Date Acquired: [  1:06/01/2016] [N/A:N/A] Weeks of Treatment: [1:1] [N/A:N/A] Wound Status: [1:Open] [N/A:N/A] Measurements L x W x D 7x4x0.1 [N/A:N/A] (cm) Area (cm) : [1:21.991] [N/A:N/A] Volume (cm) : [1:2.199] [N/A:N/A] % Reduction in Area: [1:30.00%] [N/A:N/A] % Reduction in Volume: 30.00% [N/A:N/A] Classification: [1:Partial Thickness] [N/A:N/A] Exudate Amount: [1:Medium] [N/A:N/A] Foul Odor After [1:Yes] [N/A:N/A] Cleansing: Odor Anticipated Due to No [N/A:N/A] Product Use: Wound Margin: [1:Indistinct, nonvisible] [N/A:N/A] Granulation Amount: [1:Medium (34-66%)] [N/A:N/A] Granulation Quality: [1:Pink, Pale] [N/A:N/A] Necrotic Amount: [1:Medium (34-66%)] [N/A:N/A] Necrotic Tissue: [1:Eschar] [N/A:N/A] Exposed Structures: [N/A:N/A] Fascia: No Fat: No Tendon: No Muscle: No Joint: No Bone: No Limited to Skin Breakdown Epithelialization: None N/A N/A Periwound Skin Texture: Edema: No N/A N/A Excoriation: No Induration: No Callus: No Crepitus: No Fluctuance: No Friable: No Rash: No Scarring: No Periwound Skin Moist: Yes N/A N/A Moisture: Dry/Scaly: Yes Maceration: No Periwound Skin Color: Atrophie Blanche: No N/A N/A Cyanosis: No Ecchymosis: No Erythema: No Hemosiderin Staining: No Mottled: No Pallor: No Rubor: No Temperature:  Cool/Cold N/A N/A Tenderness on Yes N/A N/A Palpation: Wound Preparation: Ulcer Cleansing: N/A N/A Rinsed/Irrigated with Saline, Other: surg scrub and water Topical Anesthetic Applied: Other: Lidocaine 4% Treatment Notes Electronic Signature(s) Signed: 06/17/2016 5:42:05 PM By: Elpidio EricAfful, Rita BSN, RN Entered By: Elpidio EricAfful, Rita on 06/17/2016 14:22:33 Jordan BandaFULLER, Briony J. ([id number]) -------------------------------------------------------------------------------- Multi-Disciplinary Care Plan Details Patient Name: Jordan BandaFULLER, Raissa J. Date of Service: 06/17/2016 2:15 PM Medical Record Patient Account Number: [id number] Number: Treating RN: Clover MealyAfful, RN, BSN, Rita [birth year] [phone](82 y.o. Other Clinician: Date of Birth/Sex: Female) Treating ROBSON, MICHAEL Primary Care Physician: [name], NARGES Physician/Extender: G Referring Physician: Gabriel RungFARAHI, NARGES Weeks in Treatment: 1 Active Inactive Orientation to the Wound Care Program Nursing Diagnoses: Knowledge deficit related to the wound healing center program Goals: Patient/caregiver will verbalize understanding of the Wound Healing Center Program Date Initiated: 06/10/2016 Goal Status: Active Interventions: Provide education on orientation to the wound center Notes: Venous Leg Ulcer Nursing Diagnoses: Potential for venous Insuffiency (use before diagnosis confirmed) Goals: Non-invasive venous studies are completed as ordered Date Initiated: 06/10/2016 Goal Status: Active Patient will maintain optimal edema control Date Initiated: 06/10/2016 Goal Status: Active Patient/caregiver will verbalize understanding of disease process and disease management Date Initiated: 06/10/2016 Goal Status: Active Verify adequate tissue perfusion prior to therapeutic compression application Date Initiated: 06/10/2016 Goal Status: Active Interventions: Jordan BandaFULLER, Kerly J. ([id number]) Assess peripheral edema status every visit. Compression as ordered Provide  education on venous insufficiency Treatment Activities: Non-invasive vascular studies : 06/10/2016 Notes: Wound/Skin Impairment Nursing Diagnoses: Impaired tissue integrity Knowledge deficit related to ulceration/compromised skin integrity Goals: Patient/caregiver will verbalize understanding of skin care regimen Date Initiated: 06/10/2016 Goal Status: Active Ulcer/skin breakdown will have a volume reduction of 30% by week 4 Date Initiated: 06/10/2016 Goal Status: Active Ulcer/skin breakdown will have a volume reduction of 50% by week 8 Date Initiated: 06/10/2016 Goal Status: Active Ulcer/skin breakdown will have a volume reduction of 80% by week 12 Date Initiated: 06/10/2016 Goal Status: Active Ulcer/skin breakdown will heal within 14 weeks Date Initiated: 06/10/2016 Goal Status: Active Interventions: Assess patient/caregiver ability to obtain necessary supplies Assess patient/caregiver ability to perform ulcer/skin care regimen upon admission and as needed Assess ulceration(s) every visit Provide education on ulcer and skin care Treatment Activities: Skin care regimen initiated : 06/10/2016 Topical wound management initiated : 06/10/2016 Notes: Electronic Signature(s) Jordan BandaFULLER, Curlie J. ([id number]) Signed: 06/17/2016 5:42:05 PM By: Elpidio EricAfful, Rita BSN, RN Entered By: Elpidio EricAfful, Rita on 06/17/2016 14:22:17 Sabella, [name] ([id number]) -------------------------------------------------------------------------------- Pain Assessment Details Patient Name: Toni ArthursFULLER,  Linette J. Date of Service: 06/17/2016 2:15 PM Medical Record Patient Account Number: [id number] Number: Treating RN: Clover Mealy, RN, BSN, Rita February 21, 1931 [phone] y.o. Other Clinician: Date of Birth/Sex: Female) Treating ROBSON, MICHAEL Primary Care Physician: Guy Begin Physician/Extender: G Referring Physician: Guy Begin Weeks in Treatment: 1 Active Problems Location of Pain Severity and Description of Pain Patient Has  Paino No Site Locations With Dressing Change: No Pain Management and Medication Current Pain Management: Electronic Signature(s) Signed: 06/17/2016 5:42:05 PM By: Elpidio Eric BSN, RN Entered By: Elpidio Eric on 06/17/2016 14:04:33 Mcanany, [name] ([id number]) -------------------------------------------------------------------------------- Patient/Caregiver Education Details [name] Date of Service: 06/17/2016 2:15 PM Medical Record Patient Account Number: [id number] Number: Treating RN: Clover Mealy, RN, BSN, Rita Dec 05, 1931 [phone] y.o. Other Clinician: Date of Birth/Gender: Female) Treating ROBSON, MICHAEL Primary Care Physician: Guy Begin Physician/Extender: G Referring Physician: [name] in Treatment: 1 Education Assessment Education Provided To: Patient Education Topics Provided Basic Hygiene: Methods: Explain/Verbal Responses: State content correctly Venous: Methods: Explain/Verbal Responses: State content correctly Welcome To The Wound Care Center: Methods: Explain/Verbal Responses: State content correctly Wound/Skin Impairment: Methods: Explain/Verbal Responses: State content correctly Electronic Signature(s) Signed: 06/17/2016 5:42:05 PM By: Elpidio Eric BSN, RN Entered By: Elpidio Eric on 06/17/2016 14:38:15 Barbar, [name] ([id number]) -------------------------------------------------------------------------------- Wound Assessment Details [name] Date of Service: 06/17/2016 2:15 PM Medical Record Patient Account Number: [id number] Number: Treating RN: Clover Mealy, RN, BSN, Rita [birth year] [phone] y.o. Other Clinician: Date of Birth/Sex: Female) Treating ROBSON, MICHAEL Primary Care Physician: Guy Begin Physician/Extender: G Referring Physician: Guy Begin Weeks in Treatment: 1 Wound Status Wound Number: 1 Primary Venous Leg Ulcer Etiology: Wound Location: Right Lower Leg - Medial Wound  Status: Open Wounding Event: Gradually Appeared Comorbid Cataracts, Arrhythmia, Hypertension, Date Acquired: 06/01/2016 History: Osteoarthritis Weeks Of Treatment: 1 Clustered Wound: No Photos Photo Uploaded By: Elpidio Eric on 06/17/2016 17:30:19 Wound Measurements Length: (cm) 7 Width: (cm) 4 Depth: (cm) 0.1 Area: (cm) 21.991 Volume: (cm) 2.199 % Reduction in Area: 30% % Reduction in Volume: 30% Epithelialization: None Tunneling: No Undermining: No Wound Description Classification: Partial Thickness Wound Margin: Indistinct, nonvisible Exudate Amount: Medium Plagge, Aaliyah J. ([id number]) Foul Odor After Cleansing: Yes Due to Product Use: No Wound Bed Granulation Amount: Medium (34-66%) Exposed Structure Granulation Quality: Pink, Pale Fascia Exposed: No Necrotic Amount: Medium (34-66%) Fat Layer Exposed: No Necrotic Quality: Eschar Tendon Exposed: No Muscle Exposed: No Joint Exposed: No Bone Exposed: No Limited to Skin Breakdown Periwound Skin Texture Texture Color No Abnormalities Noted: No No Abnormalities Noted: No Callus: No Atrophie Blanche: No Crepitus: No Cyanosis: No Excoriation: No Ecchymosis: No Fluctuance: No Erythema: No Friable: No Hemosiderin Staining: No Induration: No Mottled: No Localized Edema: No Pallor: No Rash: No Rubor: No Scarring: No Temperature / Pain Moisture Temperature: Cool/Cold No Abnormalities Noted: No Tenderness on Palpation: Yes Dry / Scaly: Yes Maceration: No Moist: Yes Wound Preparation Ulcer Cleansing: Rinsed/Irrigated with Saline, Other: surg scrub and water, Topical Anesthetic Applied: Other: Lidocaine 4%, Treatment Notes Wound #1 (Right, Medial Lower Leg) 1. Cleansed with: Cleanse wound with antibacterial soap and water 3. Peri-wound Care: Other peri-wound care (specify in notes) 4. Dressing Applied: Aquacel Ag 5. Secondary Dressing Applied Bordered Foam Dressing Dry Gauze 7. Secured  with Tubigrip NILSA, MACHT ([id number]) Notes Patient going to the hospital to have a venous duplex studies done Electronic Signature(s) Signed: 06/17/2016 5:42:05 PM By: Elpidio Eric BSN, RN Entered By: Elpidio Eric on 06/17/2016 14:20:11 Winkels,  Raphaela Shela CommonsJ. ([id number]) -------------------------------------------------------------------------------- Vitals Details Patient Name: Jordan BandaFULLER, Charlen J. Date of Service: 06/17/2016 2:15 PM Medical Record Patient Account Number: [id number] Number: Treating RN: Clover MealyAfful, RN, BSN, Rita 1931-07-09 [phone](82 y.o. Other Clinician: Date of Birth/Sex: Female) Treating ROBSON, MICHAEL Primary Care Physician: [name], NARGES Physician/Extender: G Referring Physician: [name], NARGES Weeks in Treatment: 1 Vital Signs Time Taken: 14:12 Temperature (F): 98.5 Height (in): 61 Pulse (bpm): 84 Weight (lbs): 170 Respiratory Rate (breaths/min): 17 Body Mass Index (BMI): 32.1 Blood Pressure (mmHg): 119/67 Reference Range: 80 - 120 mg / dl Electronic Signature(s) Signed: 06/17/2016 5:42:05 PM By: Elpidio EricAfful, Rita BSN, RN Entered By: Elpidio EricAfful, Rita on 06/17/2016 14:12:38

## 2016-06-24 ENCOUNTER — Encounter: Payer: Medicare Other | Admitting: Nurse Practitioner

## 2016-06-24 DIAGNOSIS — L97211 Non-pressure chronic ulcer of right calf limited to breakdown of skin: Secondary | ICD-10-CM | POA: Diagnosis not present

## 2016-06-26 NOTE — Progress Notes (Signed)
[name], Sanam J. ([id number]) Visit Report for 06/24/2016 Chief Complaint Document Details [name], Tanith J. Date of Service: 06/24/2016 3:00 PM Medical Record Number: [id number] Patient Account Number: [id number] Date of Birth/Sex: 12/24/1930 (82 y.o. Female) Treating RN: Leonard Downing[place], Sendra Primary Care Physician: [name], NARGES Other Clinician: Referring Physician: [name], NARGES Treating Physician/Extender: Eugene GarnetSaunders, Sharon Weeks in Treatment: 2 Information Obtained from: Patient Chief Complaint 06/10/16; the patient is here for review of a wound on the right anterior medial lower leg above the ankle Electronic Signature(s) Signed: 06/25/2016 5:22:48 PM By: Georges LynchSaunders, Sharon FNP Entered By: Georges LynchSaunders, Sharon on 06/24/2016 15:46:02 Acree, [name] ([id number]) -------------------------------------------------------------------------------- HPI Details [name], Jordan J. Date of Service: 06/24/2016 3:00 PM Medical Record Number: [id number] Patient Account Number: [id number] Date of Birth/Sex: 12/24/1930 (82 y.o. Female) Treating RN: Leonard Downing[place], Sendra Primary Care Physician: [name], NARGES Other Clinician: Referring Physician: [name], NARGES Treating Physician/Extender: Eugene GarnetSaunders, Sharon Weeks in Treatment: 2 History of Present Illness HPI Description: 06/10/16; this is an 82 year old woman who lives near [place] with family members. She saw her primary doctor roughly over a week ago and was referred to the ER for a cellulitis in the right medial leg. She was given a round of IV antibiotics in the emergency room and discharged on oral Keflex which she is taking. She is been left with a uncomfortable leg area on the right medial lower leg. Her ABIs in this clinic were 0.8 on the right 0.6 on the left. She does not describe claudication. She is not a diabetic. Lab work in the ER showed a normal BUN and creatinine on 6/20 at 18 and 0.92 respectively white count was 10.1  hemoglobin at 13.8. She did not have any imaging studies. I can see no vascular evaluations in Epic. She does not have a prior wound history 06/17/16; the area affected on her right medial ankle is an area I thought was due to tissue damage from cellulitis last week. She is going for venous reflux studies this afternoon, these were not ordered in this clinic at least not by me. The patient does not give a prior history of damage to the skin in this area although I wonder if she actually might not of noticed it. We have been using's Aquacel Ag under a wrap although we will not be able to wrap her today 06/24/16: pt reports today she hasn't heard from the vascular clinic regarding proceeding with arterial studies. our office will assist with this today. she denies s/s of infection. denies problems with her wraps. Electronic Signature(s) Signed: 06/25/2016 5:22:48 PM By: Georges LynchSaunders, Sharon FNP Entered By: Georges LynchSaunders, Sharon on 06/24/2016 15:47:13 [place], [name] ([id number]) -------------------------------------------------------------------------------- Physical Exam Details [name], Jordan J. Date of Service: 06/24/2016 3:00 PM Medical Record Number: [id number] Patient Account Number: [id number] Date of Birth/Sex: 12/24/1930 (82 y.o. Female) Treating RN: Leonard Downing[place], Sendra Primary Care Physician: [name], NARGES Other Clinician: Referring Physician: [name], NARGES Treating Physician/Extender: Eugene GarnetSaunders, Sharon Weeks in Treatment: 2 Constitutional Patient's appearance is neat and clean. Appears in no acute distress. Well nourished and well developed.. Eyes Conjunctivae clear. No discharge.. Ears, Nose, Mouth, and Throat Patient can hear normal speaking tones without difficulty.. Cardiovascular Edema present in both extremities, 1 + pitting. hemosiderin staining right lower leg. no palpable pedal pulses right foot.Marland Kitchen. Psychiatric Judgement and insight intact.. Alert and oriented times 3..  Short and long term memory intact.. No evidence of depression, anxiety, or agitation. Calm, cooperative, and communicative. Appropriate interactions and affect.. Electronic Signature(s) Signed: 06/25/2016  5:22:48 PM By: Georges Lynch FNP Entered By: Georges Lynch on 06/24/2016 15:48:09 [place], [name] ([id number]) -------------------------------------------------------------------------------- Physician Orders Details [name] Date of Service: 06/24/2016 3:00 PM Medical Record Number: [id number] Patient Account Number: [id number] Date of Birth/Sex: October 14, 1931 (82 y.o. Female) Treating RN: Leonard Downing Primary Care Physician: Guy Begin Other Clinician: Referring Physician: Guy Begin Treating Physician/Extender: Eugene Garnet in Treatment: 2 Verbal / Phone Orders: Yes Clinician: Leonard Downing Read Back and Verified: Yes Diagnosis Coding Wound Cleansing Wound #1 Right,Medial Lower Leg o Cleanse wound with mild soap and water o May shower with protection. Skin Barriers/Peri-Wound Care Wound #1 Right,Medial Lower Leg o Other: - TCA Primary Wound Dressing Wound #1 Right,Medial Lower Leg o Aquacel Ag Secondary Dressing Wound #1 Right,Medial Lower Leg o Dry Gauze o Boardered Foam Dressing Dressing Change Frequency Wound #1 Right,Medial Lower Leg o Change dressing every week Follow-up Appointments Wound #1 Right,Medial Lower Leg o Return Appointment in 1 week. Edema Control Wound #1 Right,Medial Lower Leg o Tubigrip Additional Orders / Instructions Wound #1 Right,Medial Lower Leg o Increase protein intake. o Activity as tolerated Daoust, Zeffie J. ([id number]) Notes follow up with [facility] for appointment Electronic Signature(s) Signed: 06/24/2016 4:17:03 PM By: Maureen Chatters Signed: 06/25/2016 5:22:48 PM By: Georges Lynch FNP Entered By: Lucrezia Starch RN, Sendra on 06/24/2016  15:08:24 KADISHA, GOODINE ([id number]) -------------------------------------------------------------------------------- Problem List Details Patient Name: ANALYSSE, QUINONEZ. Date of Service: 06/24/2016 3:00 PM Medical Record Number: [id number] Patient Account Number: [id number] Date of Birth/Sex: 1930-12-22 (82 y.o. 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 Date of Service: 06/24/2016 3:00 PM Medical Record Number: [id number] Patient Account Number: [id number] Date of Birth/Sex: 1931/04/30 (82 y.o. Female) Treating RN: Leonard Downing Primary Care Physician: Guy Begin Other Clinician: Referring Physician: Guy Begin Treating Physician/Extender: Eugene Garnet in Treatment: 2 Subjective Chief Complaint Information obtained from Patient 06/10/16; the patient is here for review of a wound on the right anterior medial lower leg above the ankle History of Present Illness (HPI) 06/10/16; this is an 82 year old woman who lives near [place] with family members. She saw her primary doctor roughly over a week ago and was referred to the ER for a cellulitis in the right medial leg. She  was given a round of IV antibiotics in the emergency room and discharged on oral Keflex which she is taking. She is been left with a uncomfortable leg area on the right medial lower leg. Her ABIs in this clinic were 0.8 on the right 0.6 on the left. She does not describe claudication. She is not a diabetic. Lab work in the ER showed a normal BUN and creatinine on 6/20 at 18 and 0.92 respectively white count was 10.1 hemoglobin at 13.8. She did not have any imaging studies. I can see no vascular evaluations in Epic. She does not have a prior wound history 06/17/16; the area affected on her right  medial ankle is an area I thought was due to tissue damage from cellulitis last week. She is going for venous reflux studies this afternoon, these were not ordered in this clinic at least not by me. The patient does not give a prior history of damage to the skin in this area although I wonder if she actually might not of noticed it. We have been using's Aquacel Ag under a wrap although we will not be able to wrap her today 06/24/16: pt reports today she hasn't heard from the vascular clinic regarding proceeding with arterial studies. our office will assist with this today. she denies s/s of infection. denies problems with her wraps. denies fever, chills, body aches or malaise. Objective Constitutional Patient's appearance is neat and clean. Appears in no acute distress. Well nourished and well developed.. Vitals Time Taken: 3:00 PM, Height: 61 in, Weight: 170 lbs, BMI: 32.1, Temperature: 98.1 F, Pulse: 77 bpm, Blood Pressure: 131/67 mmHg. TAKELIA, URIETA ([id number]) Eyes Conjunctivae clear. No discharge.. Ears, Nose, Mouth, and Throat Patient can hear normal speaking tones without difficulty.. Cardiovascular Edema present in both extremities, 1 + pitting. hemosiderin staining right lower leg. no palpable pedal pulses right foot.Marland Kitchen Psychiatric Judgement and insight intact.. Alert and oriented times  3.. Short and long term memory intact.. No evidence of depression, anxiety, or agitation. Calm, cooperative, and communicative. Appropriate interactions and affect.. Integumentary (Hair, Skin) Wound #1 status is Open. Original cause of wound was Gradually Appeared. The wound is located on the Right,Medial Lower Leg. The wound measures 8cm length x 4.5cm width x 0.1cm depth; 28.274cm^2 area and 2.827cm^3 volume. The wound is limited to skin breakdown. There is no tunneling or undermining noted. There is a medium amount of serosanguineous drainage noted. The wound margin is indistinct and nonvisible. There is medium (34-66%) pink, pale granulation within the wound bed. There is a medium (34- 66%) amount of necrotic tissue within the wound bed including Adherent Slough. The periwound skin appearance exhibited: Dry/Scaly. The periwound skin appearance did not exhibit: Callus, Crepitus, Excoriation, Fluctuance, Friable, Induration, Localized Edema, Rash, Scarring, Maceration, Moist, Atrophie Blanche, Cyanosis, Ecchymosis, Hemosiderin Staining, Mottled, Pallor, Rubor, Erythema. Periwound temperature was noted as Cool/Cold. The periwound has tenderness on palpation. 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 ([id number]) Plan Wound Cleansing: Wound #1 Right,Medial Lower Leg: Cleanse wound with mild soap and water May shower with protection. Skin Barriers/Peri-Wound Care: Wound #1 Right,Medial Lower Leg: Other: - TCA Primary Wound Dressing: Wound #1 Right,Medial Lower  Leg: Aquacel Ag Secondary Dressing: Wound #1 Right,Medial Lower Leg: Dry Gauze Boardered Foam Dressing Dressing Change Frequency: Wound #1 Right,Medial Lower Leg: Change dressing every week Follow-up Appointments: Wound #1 Right,Medial Lower Leg: Return Appointment in 1 week. Edema Control: Wound #1 Right,Medial Lower Leg: Tubigrip Additional Orders / Instructions: Wound #1 Right,Medial Lower Leg: Increase protein intake. Activity as tolerated General Notes: follow up with [facility] for appointment Follow-Up Appointments: A follow-up appointment should be scheduled. Medication Reconciliation completed and provided to Patient/Care Provider. A Patient Clinical Summary of Care was provided to CF 1. our office will notify the vascular  center and ascertain appointment status. 2. continue current management. see orders above. HARTLEIGH, EDMONSTON ([id number]) Electronic Signature(s) Signed: 06/25/2016 5:22:48 PM By: Georges Lynch FNP Entered By: Georges Lynch on 06/24/2016 15:49:23 Tuckerman, [name] ([id number]) -------------------------------------------------------------------------------- SuperBill Details [name] Date of Service: 06/24/2016 Medical Record Number: [id number] Patient Account Number: [id number] Date of Birth/Sex: 07-17-1931 (82 y.o. Female) Treating RN: Leonard Downing Primary Care Physician: Guy Begin Other Clinician: Referring Physician: Guy Begin Treating Physician/Extender: Eugene Garnet in Treatment: 2 Diagnosis Coding ICD-10 Codes Code Description [phone] Non-pressure chronic ulcer of right calf limited to breakdown of skin L03.115 Cellulitis of right lower limb I70.232 Atherosclerosis of native arteries of right leg with ulceration of calf Facility Procedures CPT4 Code: [id number] Description: 99213 - WOUND CARE VISIT-LEV 3 EST PT Modifier: Quantity: 1 Physician Procedures CPT4 Code  Description: [id number] 99213 - WC PHYS LEVEL 3 - EST PT ICD-10 Description Diagnosis 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 I70.232 Atherosclerosis of native arteries of right leg with L03.115 Cellulitis of right lower limb Modifier: reakdown of ski ulceration of c Quantity: 1 n alf Electronic Signature(s) Signed: 06/25/2016 5:22:48 PM By: Georges Lynch FNP Entered By: Georges Lynch on 06/24/2016 15:49:40

## 2016-06-26 NOTE — Progress Notes (Signed)
[name], Jordan J. ([id number]) Visit Report for 06/24/2016 Arrival Information Details [name], Jordan J. Date of Service: 06/24/2016 3:00 PM Medical Record Number: [id number] Patient Account Number: [id number] Date of Birth/Sex: May 10, 1931 (82 y.o. Female) Treating RN: Leonard Downingoseboro, Sendra Primary Care Physician: [name], NARGES Other Clinician: Referring Physician: [name], NARGES Treating Physician/Extender: Eugene GarnetSaunders, Sharon Weeks in Treatment: 2 Visit Information History Since Last Visit All ordered tests and consults were completed: No Patient Arrived: Gilmer MorCane Added or deleted any medications: No Arrival Time: 14:45 Any new allergies or adverse reactions: No Accompanied By: self Had a fall or experienced change in No Transfer Assistance: None activities of daily living that may affect Patient Identification Verified: Yes risk of falls: Secondary Verification Process Completed: Yes Signs or symptoms of abuse/neglect since last No Patient Requires Transmission-Based No visito Precautions: Hospitalized since last visit: No Patient Has Alerts: No Has Dressing in Place as Prescribed: Yes Pain Present Now: Yes Electronic Signature(s) Signed: 06/24/2016 4:17:03 PM By: Lucrezia Starchoseboro, RN, Sendra Entered By: Lucrezia Starchoseboro, RN, Sendra on 06/24/2016 14:45:53 [name], [name] ([id number]) -------------------------------------------------------------------------------- Clinic Level of Care Assessment Details [name], Jordan J. Date of Service: 06/24/2016 3:00 PM Medical Record Number: [id number] Patient Account Number: [id number] Date of Birth/Sex: May 10, 1931 (82 y.o. Female) Treating RN: Leonard Downingoseboro, Sendra Primary Care Physician: [name], NARGES Other Clinician: Referring Physician: [name], NARGES Treating Physician/Extender: Eugene GarnetSaunders, Sharon Weeks in Treatment: 2 Clinic Level of Care Assessment Items TOOL 4 Quantity Score X - Use when only an EandM is performed on FOLLOW-UP visit  1 0 ASSESSMENTS - Nursing Assessment / Reassessment X - Reassessment of Co-morbidities (includes updates in patient status) 1 10 X - Reassessment of Adherence to Treatment Plan 1 5 ASSESSMENTS - Wound and Skin Assessment / Reassessment X - Simple Wound Assessment / Reassessment - one wound 1 5 []  - Complex Wound Assessment / Reassessment - multiple wounds 0 []  - Dermatologic / Skin Assessment (not related to wound area) 0 ASSESSMENTS - Focused Assessment []  - Circumferential Edema Measurements - multi extremities 0 []  - Nutritional Assessment / Counseling / Intervention 0 X - Lower Extremity Assessment (monofilament, tuning fork, pulses) 1 5 []  - Peripheral Arterial Disease Assessment (using hand held doppler) 0 ASSESSMENTS - Ostomy and/or Continence Assessment and Care []  - Incontinence Assessment and Management 0 []  - Ostomy Care Assessment and Management (repouching, etc.) 0 PROCESS - Coordination of Care X - Simple Patient / Family Education for ongoing care 1 15 []  - Complex (extensive) Patient / Family Education for ongoing care 0 X - Staff obtains ChiropractorConsents, Records, Test Results / Process Orders 1 10 []  - Staff telephones HHA, Nursing Homes / Clarify orders / etc 0 []  - Routine Transfer to another Facility (non-emergent condition) 0 [name], [name] ([id number]) []  - Routine Hospital Admission (non-emergent condition) 0 []  - New Admissions / Manufacturing engineernsurance Authorizations / Ordering NPWT, Apligraf, etc. 0 []  - Emergency Hospital Admission (emergent condition) 0 X - Simple Discharge Coordination 1 10 []  - Complex (extensive) Discharge Coordination 0 PROCESS - Special Needs []  - Pediatric / Minor Patient Management 0 []  - Isolation Patient Management 0 []  - Hearing / Language / Visual special needs 0 []  - Assessment of Community assistance (transportation, D/C planning, etc.) 0 []  - Additional assistance / Altered mentation 0 []  - Support Surface(s) Assessment (bed, cushion, seat,  etc.) 0 INTERVENTIONS - Wound Cleansing / Measurement X - Simple Wound Cleansing - one wound 1 5 []  - Complex Wound Cleansing - multiple wounds 0 X -  Wound Imaging (photographs - any number of wounds) 1 5 []  - Wound Tracing (instead of photographs) 0 X - Simple Wound Measurement - one wound 1 5 []  - Complex Wound Measurement - multiple wounds 0 INTERVENTIONS - Wound Dressings X - Small Wound Dressing one or multiple wounds 1 10 []  - Medium Wound Dressing one or multiple wounds 0 []  - Large Wound Dressing one or multiple wounds 0 X - Application of Medications - topical 1 5 []  - Application of Medications - injection 0 INTERVENTIONS - Miscellaneous []  - External ear exam 0 [name], [name] ([id number]) []  - Specimen Collection (cultures, biopsies, blood, body fluids, etc.) 0 []  - Specimen(s) / Culture(s) sent or taken to Lab for analysis 0 []  - Patient Transfer (multiple staff / Michiel Sites Lift / Similar devices) 0 []  - Simple Staple / Suture removal (25 or less) 0 []  - Complex Staple / Suture removal (26 or more) 0 []  - Hypo / Hyperglycemic Management (close monitor of Blood Glucose) 0 []  - Ankle / Brachial Index (ABI) - do not check if billed separately 0 X - Vital Signs 1 5 Has the patient been seen at the hospital within the last three years: Yes Total Score: 95 Level Of Care: New/Established - Level 3 Electronic Signature(s) Signed: 06/24/2016 4:17:03 PM By: Lucrezia Starch, RN, Sendra Entered By: Lucrezia Starch RN, Sendra on 06/24/2016 15:46:35 [name] ([id number]) -------------------------------------------------------------------------------- Encounter Discharge Information Details Patient Name: Jordan Banda. Date of Service: 06/24/2016 3:00 PM Medical Record Number: [id number] Patient Account Number: [id number] Date of Birth/Sex: [birth year] (82 y.o. Female) Treating RN: Leonard Downing Primary Care Physician: Guy Begin Other Clinician: Referring Physician: Guy Begin Treating Physician/Extender: Eugene Garnet in Treatment: 2 Encounter Discharge Information Items Discharge Pain Level: 3 Discharge Condition: Stable Ambulatory Status: Cane Discharge Destination: Home Transportation: Private Auto Schedule Follow-up Appointment: Yes Medication Reconciliation completed and provided to Patient/Care Yes Tyjah Hai: Provided on Clinical Summary of Care: 06/24/2016 Form Type Recipient Paper Patient CF Electronic Signature(s) Signed: 06/24/2016 4:17:03 PM By: Lucrezia Starch RN, Sendra Previous Signature: 06/24/2016 3:09:48 PM Version By: Gwenlyn Perking Entered By: Lucrezia Starch RN, Sendra on 06/24/2016 15:15:41 Banbury, Gardiner Hopkins ([id number]) -------------------------------------------------------------------------------- Lower Extremity Assessment Details Patient Name: Jordan Banda. Date of Service: 06/24/2016 3:00 PM Medical Record Number: [id number] Patient Account Number: [id number] Date of Birth/Sex: 1931-05-08 (82 y.o. Female) Treating RN: Leonard Downing Primary Care Physician: Guy Begin Other Clinician: Referring Physician: Guy Begin Treating Physician/Extender: Eugene Garnet in Treatment: 2 Edema Assessment Assessed: [Left: No] [Right: No] Edema: [Left: Ye] [Right: s] Vascular Assessment Pulses: Posterior Tibial Dorsalis Pedis Palpable: [Right:Yes] Extremity colors, hair growth, and conditions: Extremity Color: [Right:Normal] Hair Growth on Extremity: [Right:No] Temperature of Extremity: [Right:Warm] Capillary Refill: [Right:< 3 seconds] Toe Nail Assessment Left: Right: Thick: No Discolored: No Deformed: No Improper Length and Hygiene: No Electronic Signature(s) Signed: 06/24/2016 4:17:03 PM By: Lucrezia Starch, RN, Sendra Entered By: Lucrezia Starch RN, Sendra on 06/24/2016 14:50:30 Langbehn, Gardiner Hopkins ([id number]) -------------------------------------------------------------------------------- Pain Assessment  Details Patient Name: Jordan Banda. Date of Service: 06/24/2016 3:00 PM Medical Record Number: [id number] Patient Account Number: [id number] Date of Birth/Sex: 1931-09-11 (82 y.o. Female) Treating RN: Leonard Downing Primary Care Physician: Guy Begin Other Clinician: Referring Physician: Guy Begin Treating Physician/Extender: Eugene Garnet in Treatment: 2 Active Problems Location of Pain Severity and Description of Pain Patient Has Paino Yes Site Locations Pain Location: Pain in Ulcers Rate the pain. Current Pain Level: 3 Character of Pain Describe the Pain: Tender Pain Management and  Medication Current Pain Management: Electronic Signature(s) Signed: 06/24/2016 4:17:03 PM By: Lucrezia Starch, RN, Sendra Entered By: Lucrezia Starch RN, Sendra on 06/24/2016 14:46:08 Settlemire, Gardiner Hopkins ([id number]) -------------------------------------------------------------------------------- Patient/Caregiver Education Details Patient Name: Jordan Banda Date of Service: 06/24/2016 3:00 PM Medical Record Number: [id number] Patient Account Number: [id number] Date of Birth/Gender: 01-24-1931 (82 y.o. Female) Treating RN: Leonard Downing Primary Care Physician: Guy Begin Other Clinician: Referring Physician: Guy Begin Treating Physician/Extender: Eugene Garnet in Treatment: 2 Education Assessment Education Provided To: Patient Education Topics Provided Wound/Skin Impairment: Handouts: Caring for Your Ulcer, Skin Care Do's and Dont's Methods: Explain/Verbal Responses: State content correctly Electronic Signature(s) Signed: 06/24/2016 4:17:03 PM By: Lucrezia Starch, RN, Sendra Entered By: Lucrezia Starch RN, Sendra on 06/24/2016 15:15:55 Padovano, Gardiner Hopkins ([id number]) -------------------------------------------------------------------------------- Wound Assessment Details Patient Name: Jordan Banda. Date of Service: 06/24/2016 3:00 PM Medical Record Number:  [id number] Patient Account Number: [id number] Date of Birth/Sex: 1931-04-11 (82 y.o. Female) Treating RN: Leonard Downing Primary Care Physician: Guy Begin Other Clinician: Referring Physician: Guy Begin Treating Physician/Extender: Eugene Garnet in Treatment: 2 Wound Status Wound Number: 1 Primary Venous Leg Ulcer Etiology: Wound Location: Right Lower Leg - Medial Wound Status: Open Wounding Event: Gradually Appeared Comorbid Cataracts, Arrhythmia, Date Acquired: 06/01/2016 History: Hypertension, Osteoarthritis Weeks Of Treatment: 2 Clustered Wound: No Photos Photo Uploaded By: Lucrezia Starch, RN, Rosalio Macadamia on 06/24/2016 15:33:45 Wound Measurements Length: (cm) 8 % Reduction in A Width: (cm) 4.5 % Reduction in V Depth: (cm) 0.1 Epithelializatio Area: (cm) 28.274 Tunneling: Volume: (cm) 2.827 Undermining: rea: 10% olume: 10% n: None No No Wound Description Classification: Partial Thickness Wound Margin: Indistinct, nonvisible Exudate Amount: Medium Exudate Type: Serosanguineous Exudate Color: red, brown Foul Odor After Cleansing: Yes Due to Product Use: No Wound Bed Granulation Amount: Medium (34-66%) Exposed Structure Granulation Quality: Pink, Pale Fascia Exposed: No Necrotic Amount: Medium (34-66%) Fat Layer Exposed: No Necrotic Quality: Adherent Slough Tendon Exposed: No Steinhart, Ethal J. ([id number]) Muscle Exposed: No Joint Exposed: No Bone Exposed: No Limited to Skin Breakdown Periwound Skin Texture Texture Color No Abnormalities Noted: No No Abnormalities Noted: No Callus: No Atrophie Blanche: No Crepitus: No Cyanosis: No Excoriation: No Ecchymosis: No Fluctuance: No Erythema: No Friable: No Hemosiderin Staining: No Induration: No Mottled: No Localized Edema: No Pallor: No Rash: No Rubor: No Scarring: No Temperature / Pain Moisture Temperature: Cool/Cold No Abnormalities Noted: No Tenderness on Palpation: Yes Dry /  Scaly: Yes Maceration: No Moist: No Wound Preparation Ulcer Cleansing: Rinsed/Irrigated with Saline Topical Anesthetic Applied: Other: Lidocaine 4%, Treatment Notes Wound #1 (Right, Medial Lower Leg) 1. Cleansed with: Clean wound with Normal Saline 2. Anesthetic Topical Lidocaine 4% cream to wound bed prior to debridement 4. Dressing Applied: Aquacel Ag 5. Secondary Dressing Applied Bordered Foam Dressing Notes Patient going to the hospital to have a venous duplex studies done Electronic Signature(s) Signed: 06/24/2016 4:17:03 PM By: Lucrezia Starch, RN, Sendra Entered By: Lucrezia Starch RN, Sendra on 06/24/2016 14:51:24 Conry, Gardiner Hopkins ([id number]) -------------------------------------------------------------------------------- Vitals Details Patient Name: Jordan Banda. Date of Service: 06/24/2016 3:00 PM Medical Record Number: [id number] Patient Account Number: [id number] Date of Birth/Sex: June 19, 1931 (82 y.o. Female) Treating RN: Leonard Downing Primary Care Physician: Guy Begin Other Clinician: Referring Physician: Guy Begin Treating Physician/Extender: Eugene Garnet in Treatment: 2 Vital Signs Time Taken: 15:00 Temperature (F): 98.1 Height (in): 61 Pulse (bpm): 77 Weight (lbs): 170 Blood Pressure (mmHg): 131/67 Body Mass Index (BMI): 32.1 Reference Range: 80 - 120 mg / dl Electronic Signature(s) Signed: 06/24/2016 4:17:03 PM By: Lucrezia Starch, RN,  Rosalio Macadamia Entered By: Lucrezia Starch, RN, Rosalio Macadamia on 06/24/2016 15:07:17

## 2016-06-28 ENCOUNTER — Emergency Department: Payer: Medicare Other

## 2016-06-28 ENCOUNTER — Inpatient Hospital Stay
Admission: EM | Admit: 2016-06-28 | Discharge: 2016-06-30 | DRG: 872 | Disposition: A | Discharge status: Home / Self Care | Payer: Medicare Other | Attending: Internal Medicine | Admitting: Internal Medicine

## 2016-06-28 ENCOUNTER — Encounter: Payer: Self-pay | Admitting: Emergency Medicine

## 2016-06-28 DIAGNOSIS — E876 Hypokalemia: Secondary | ICD-10-CM | POA: Diagnosis present

## 2016-06-28 DIAGNOSIS — I10 Essential (primary) hypertension: Secondary | ICD-10-CM | POA: Diagnosis present

## 2016-06-28 DIAGNOSIS — A419 Sepsis, unspecified organism: Principal | ICD-10-CM | POA: Diagnosis present

## 2016-06-28 DIAGNOSIS — K219 Gastro-esophageal reflux disease without esophagitis: Secondary | ICD-10-CM | POA: Diagnosis present

## 2016-06-28 DIAGNOSIS — Z96652 Presence of left artificial knee joint: Secondary | ICD-10-CM | POA: Diagnosis present

## 2016-06-28 DIAGNOSIS — D696 Thrombocytopenia, unspecified: Secondary | ICD-10-CM | POA: Diagnosis present

## 2016-06-28 DIAGNOSIS — Z9889 Other specified postprocedural states: Secondary | ICD-10-CM | POA: Diagnosis not present

## 2016-06-28 DIAGNOSIS — Z66 Do not resuscitate: Secondary | ICD-10-CM | POA: Diagnosis present

## 2016-06-28 DIAGNOSIS — Z7982 Long term (current) use of aspirin: Secondary | ICD-10-CM | POA: Diagnosis not present

## 2016-06-28 DIAGNOSIS — M1990 Unspecified osteoarthritis, unspecified site: Secondary | ICD-10-CM | POA: Diagnosis present

## 2016-06-28 DIAGNOSIS — Z79899 Other long term (current) drug therapy: Secondary | ICD-10-CM | POA: Diagnosis not present

## 2016-06-28 DIAGNOSIS — Z882 Allergy status to sulfonamides status: Secondary | ICD-10-CM | POA: Diagnosis not present

## 2016-06-28 DIAGNOSIS — L03115 Cellulitis of right lower limb: Secondary | ICD-10-CM | POA: Diagnosis present

## 2016-06-28 DIAGNOSIS — R251 Tremor, unspecified: Secondary | ICD-10-CM | POA: Diagnosis present

## 2016-06-28 DIAGNOSIS — L97919 Non-pressure chronic ulcer of unspecified part of right lower leg with unspecified severity: Secondary | ICD-10-CM | POA: Diagnosis present

## 2016-06-28 HISTORY — DX: Dizziness and giddiness: R42

## 2016-06-28 LAB — COMPREHENSIVE METABOLIC PANEL
ALBUMIN: 4.3 g/dL (ref 3.5–5.0)
ALT: 17 U/L (ref 14–54)
ANION GAP: 11 (ref 5–15)
AST: 31 U/L (ref 15–41)
Alkaline Phosphatase: 80 U/L (ref 38–126)
BILIRUBIN TOTAL: 1.4 mg/dL — AB (ref 0.3–1.2)
BUN: 17 mg/dL (ref 6–20)
CHLORIDE: 100 mmol/L — AB (ref 101–111)
CO2: 24 mmol/L (ref 22–32)
Calcium: 9.7 mg/dL (ref 8.9–10.3)
Creatinine, Ser: 1.02 mg/dL — ABNORMAL HIGH (ref 0.44–1.00)
GFR calc Af Amer: 58 mL/min — ABNORMAL LOW (ref >60)
GFR, EST NON AFRICAN AMERICAN: 50 mL/min — AB (ref >60)
Glucose, Bld: 107 mg/dL — ABNORMAL HIGH (ref 65–99)
POTASSIUM: 3.4 mmol/L — AB (ref 3.5–5.1)
Sodium: 135 mmol/L (ref 135–145)
TOTAL PROTEIN: 7.4 g/dL (ref 6.5–8.1)

## 2016-06-28 LAB — CBC
HEMATOCRIT: 40.5 % (ref 35.0–47.0)
HEMOGLOBIN: 14.2 g/dL (ref 12.0–16.0)
MCH: 32.1 pg (ref 26.0–34.0)
MCHC: 35 g/dL (ref 32.0–36.0)
MCV: 91.6 fL (ref 80.0–100.0)
Platelets: 108 10*3/uL — ABNORMAL LOW (ref 150–440)
RBC: 4.42 MIL/uL (ref 3.80–5.20)
RDW: 13 % (ref 11.5–14.5)
WBC: 20 10*3/uL — ABNORMAL HIGH (ref 3.6–11.0)

## 2016-06-28 LAB — URINALYSIS COMPLETE WITH MICROSCOPIC (ARMC ONLY)
BACTERIA UA: NONE SEEN
BILIRUBIN URINE: NEGATIVE
Glucose, UA: NEGATIVE mg/dL
KETONES UR: NEGATIVE mg/dL
Leukocytes, UA: NEGATIVE
Nitrite: NEGATIVE
PH: 7 (ref 5.0–8.0)
PROTEIN: NEGATIVE mg/dL
Specific Gravity, Urine: 1.013 (ref 1.005–1.030)
WBC UA: NONE SEEN WBC/hpf (ref 0–5)

## 2016-06-28 MED ORDER — ACETAMINOPHEN 650 MG RE SUPP: 650.0000 mg | Freq: Four times a day (QID) | RECTAL | Status: DC | PRN

## 2016-06-28 MED ORDER — DOCUSATE SODIUM 100 MG PO CAPS: 100.0000 mg | ORAL_CAPSULE | Freq: Every day | ORAL | Status: DC | PRN

## 2016-06-28 MED ORDER — ACETAMINOPHEN 500 MG PO TABS
1000.0000 mg | ORAL_TABLET | Freq: Once | ORAL | Status: AC
  Administered 2016-06-28: 1000 mg via ORAL

## 2016-06-28 MED ORDER — ENOXAPARIN SODIUM 40 MG/0.4ML ~~LOC~~ SOLN
40.0000 mg | SUBCUTANEOUS | Status: DC
  Administered 2016-06-28 – 2016-06-29 (×2): 40 mg via SUBCUTANEOUS
  Filled 2016-06-28 (×2): qty 0.4

## 2016-06-28 MED ORDER — ONDANSETRON HCL 4 MG/2ML IJ SOLN: 4.0000 mg | Freq: Four times a day (QID) | INTRAMUSCULAR | Status: DC | PRN

## 2016-06-28 MED ORDER — ACETAMINOPHEN 500 MG PO TABS
ORAL_TABLET | ORAL | Status: AC
  Administered 2016-06-28: 1000 mg via ORAL
  Filled 2016-06-28: qty 2

## 2016-06-28 MED ORDER — VITAMIN D 1000 UNITS PO TABS
1000.0000 [IU] | ORAL_TABLET | Freq: Every day | ORAL | Status: DC
  Administered 2016-06-29 – 2016-06-30 (×2): 1000 [IU] via ORAL
  Filled 2016-06-28 (×3): qty 1

## 2016-06-28 MED ORDER — SODIUM CHLORIDE 0.9 % IV SOLN
Freq: Once | INTRAVENOUS | Status: AC
  Administered 2016-06-28: 20:00:00 via INTRAVENOUS

## 2016-06-28 MED ORDER — PIPERACILLIN-TAZOBACTAM 3.375 G IVPB
3.3750 g | Freq: Three times a day (TID) | INTRAVENOUS | Status: DC
  Administered 2016-06-28 – 2016-06-30 (×6): 3.375 g via INTRAVENOUS
  Filled 2016-06-28 (×9): qty 50

## 2016-06-28 MED ORDER — ONDANSETRON HCL 4 MG PO TABS: 4.0000 mg | ORAL_TABLET | Freq: Four times a day (QID) | ORAL | Status: DC | PRN

## 2016-06-28 MED ORDER — VANCOMYCIN HCL IN DEXTROSE 1-5 GM/200ML-% IV SOLN
1000.0000 mg | Freq: Once | INTRAVENOUS | Status: AC
  Administered 2016-06-28: 1000 mg via INTRAVENOUS
  Filled 2016-06-28: qty 200

## 2016-06-28 MED ORDER — POTASSIUM CHLORIDE CRYS ER 10 MEQ PO TBCR
10.0000 meq | EXTENDED_RELEASE_TABLET | Freq: Every day | ORAL | Status: DC
  Administered 2016-06-28 – 2016-06-29 (×2): 10 meq via ORAL
  Filled 2016-06-28 (×2): qty 1

## 2016-06-28 MED ORDER — ASPIRIN EC 81 MG PO TBEC
81.0000 mg | DELAYED_RELEASE_TABLET | Freq: Every day | ORAL | Status: DC
  Administered 2016-06-29 – 2016-06-30 (×2): 81 mg via ORAL
  Filled 2016-06-28 (×3): qty 1

## 2016-06-28 MED ORDER — ACETAMINOPHEN 325 MG PO TABS: 650.0000 mg | ORAL_TABLET | Freq: Four times a day (QID) | ORAL | Status: DC | PRN

## 2016-06-28 MED ORDER — SODIUM CHLORIDE 0.9 % IV SOLN
INTRAVENOUS | Status: AC
  Filled 2016-06-28: qty 1000

## 2016-06-28 MED ORDER — AMLODIPINE BESYLATE 10 MG PO TABS
10.0000 mg | ORAL_TABLET | Freq: Every day | ORAL | Status: DC
  Administered 2016-06-29 – 2016-06-30 (×2): 10 mg via ORAL
  Filled 2016-06-28 (×3): qty 1

## 2016-06-28 MED ORDER — SODIUM CHLORIDE 0.9 % IV SOLN
INTRAVENOUS | Status: DC
  Administered 2016-06-28 – 2016-06-30 (×3): via INTRAVENOUS

## 2016-06-28 MED ORDER — VANCOMYCIN HCL IN DEXTROSE 1-5 GM/200ML-% IV SOLN
1000.0000 mg | INTRAVENOUS | Status: DC
  Administered 2016-06-29 (×2): 1000 mg via INTRAVENOUS
  Filled 2016-06-28 (×3): qty 200

## 2016-06-28 MED ORDER — MECLIZINE HCL 25 MG PO TABS
25.0000 mg | ORAL_TABLET | Freq: Three times a day (TID) | ORAL | Status: DC | PRN
Start: 2016-06-28 — End: 2016-06-30
  Filled 2016-06-28: qty 1

## 2016-06-28 MED ORDER — LABETALOL HCL 100 MG PO TABS
100.0000 mg | ORAL_TABLET | Freq: Two times a day (BID) | ORAL | Status: DC
  Administered 2016-06-28 – 2016-06-30 (×4): 100 mg via ORAL
  Filled 2016-06-28 (×6): qty 1

## 2016-06-28 NOTE — ED Provider Notes (Signed)
[facility] Emergency Department Provider Note  Time seen: 5:15 PM  I have reviewed the triage vital signs and the nursing notes.   HISTORY  Chief Complaint Weakness; Dizziness; and Fever    HPI [name] is a 82 y.o. female with a past medical history of hypertension,who presents to the emergency department with generalized weakness and fever. According to the patient for the past 2 days she has been feeling extremely weak, she noted a fever to 102 this morning, and she was unable to get out of the chair by herself so she called EMS. Patient denies any focal symptoms. Denies any abdominal pain, chest pain, nausea, vomiting, diarrhea, dysuria. Patient does have a chronic wound to the right lower leg but denies any increased pain, and the wound is currently being treated by wound clinic.     Past Medical History  Diagnosis Date  . Hypertension   . Dizziness     There are no active problems to display for this patient.   Past Surgical History  Procedure Laterality Date  . Hernia repair    . Joint replacement      left knee    Current Outpatient Rx  Name  Route  Sig  Dispense  Refill  . amLODipine (NORVASC) 10 MG tablet   Oral   Take 1 tablet by mouth daily.      5   . DOCQLACE 100 MG capsule   Oral   Take 1 capsule by mouth 3 (three) times daily.      2     Dispense as written.   . labetalol (NORMODYNE) 100 MG tablet   Oral   Take 1 tablet by mouth 2 (two) times daily.      5   . meclizine (ANTIVERT) 25 MG tablet   Oral   Take 1 tablet (25 mg total) by mouth 3 (three) times daily as needed for dizziness.   15 tablet   0   . ondansetron (ZOFRAN ODT) 4 MG disintegrating tablet   Oral   Take 1 tablet (4 mg total) by mouth every 8 (eight) hours as needed for nausea or vomiting.   20 tablet   0   . oxyCODONE-acetaminophen (ROXICET) 5-325 MG per tablet   Oral   Take 1 tablet by mouth every 6 (six) hours as needed.   20  tablet   0   . potassium chloride (K-DUR) 10 MEQ tablet   Oral   Take 1 tablet by mouth daily.      1   . ranitidine (ZANTAC) 150 MG tablet   Oral   Take 1 tablet (150 mg total) by mouth at bedtime.   30 tablet   1   . triamterene-hydrochlorothiazide (MAXZIDE-25) 37.5-25 MG per tablet   Oral   Take 1 tablet by mouth daily.      5     Allergies Sulfa antibiotics  History reviewed. No pertinent family history.  Social History Social History  Substance Use Topics  . Smoking status: Never Smoker   . Smokeless tobacco: None  . Alcohol Use: No    Review of Systems Constitutional: Positive for fever Cardiovascular: Negative for chest pain. Respiratory: Negative for shortness of breath. Gastrointestinal: Negative for abdominal pain Genitourinary: Negative for dysuria. Skin: Right lower leg wound Neurological: Negative for headache 10-point ROS otherwise negative.  ____________________________________________   PHYSICAL EXAM:  VITAL SIGNS: ED Triage Vitals  Enc Vitals Group     BP 06/28/16 1610  152/70 mmHg     Pulse Rate 06/28/16 1610 93     Resp 06/28/16 1610 20     Temp 06/28/16 1610 102.8 F (39.3 C)     Temp Source 06/28/16 1610 Oral     SpO2 06/28/16 1610 98 %     Weight 06/28/16 1610 169 lb 11.2 oz (76.975 kg)     Height 06/28/16 1610  (1.676 m)     Head Cir --      Peak Flow --      Pain Score 06/28/16 1612 0     Pain Loc --      Pain Edu? --      Excl. in GC? --    Constitutional: Alert and oriented. Well appearing and in no distress. Eyes: Normal exam ENT   Head: Normocephalic and atraumatic.   Mouth/Throat: Mucous membranes are moist. Cardiovascular: Normal rate, regular rhythm. No murmur Respiratory: Normal respiratory effort without tachypnea nor retractions. Breath sounds are clear  Gastrointestinal: Soft and nontender. No distention.   Musculoskeletal: Patient does have a right posterior calf chronic wound, mild drainage  mild surrounding tenderness no surrounding erythema. Neurologic:  Normal speech and language. No gross focal neurologic deficits Skin:  Skin is warm, dry and intact.  Psychiatric: Mood and affect are normal. Speech and behavior are normal.   ____________________________________________    EKG  EKG reviewed and interpreted by myself shows normal sinus rhythm at 97 bpm, narrow QRS, normal axis, prolonged PR interval consistent with first-degree AV block, nonspecific ST changes without ST elevation.  ____________________________________________    RADIOLOGY  Chest x-ray shows no acute abnormality  ____________________________________________   INITIAL IMPRESSION / ASSESSMENT AND PLAN / ED COURSE  Pertinent labs & imaging results that were available during my care of the patient were reviewed by me and considered in my medical decision making (see chart for details).  The patient presents to the emergency department with fever and generalized weakness. Review of systems is largely normal. Patient does have a chronic wound to the right lower leg with mild surrounding tenderness but no surrounding erythema. Possible cellulitis. We will check labs including blood cultures, urinalysis and chest x-ray to attempt to find the source of the patient's fever.  Patient's workup consistent with a leukocytosis of 20,000, given her fever and right lower extremity wound I believe the patient is expressing cellulitis, we will check blood cultures start IV vancomycin and admitted to the hospital for further treatment.  ____________________________________________   FINAL CLINICAL IMPRESSION(S) / ED DIAGNOSES  Fever Generalized weakness Right lower extremity cellulitis  Minna Antis, MD 06/28/16 1906

## 2016-06-28 NOTE — H&P (Signed)
[facility] [place] at [facility]   [name]    MR#:  [id number]  DATE OF BIRTH:  1931-04-24  DATE OF ADMISSION:  06/28/2016  PRIMARY CARE PHYSICIAN: [name], NARGES, MD   REQUESTING/REFERRING PHYSICIAN: Dr. Minna AntisKevin Paduchowski  CHIEF COMPLAINT:   Chief Complaint  Patient presents with  . Weakness  . Dizziness  . Fever    HISTORY OF PRESENT ILLNESS:  Jordan LameCecil Stukey  is a 82 y.o. female with a known history of Hypertension, GERD, osteoarthritis who presents to the hospital due to weakness, dizziness and having tremors. Patient was recently found to have a right lower extremity ulcer which she is being followed up at the wound center. Over the past day the family has noticed that she has been more weak not able to get around which is probably related to her right lower extremity ulcer and pain related to it. Today she was feeling quite tremulous and felt warm and was having difficulty ambulating and therefore was brought to the ER for further evaluation. Emergency room patient was noted to be febrile with temperature 102 with a leukocytosis and also noted to be tachycardic. She was diagnosed with clinical sepsis and therefore hospice services were contacted further treatment and evaluation. Patient denies any chest pain, shortness of breath, abdominal pain, nausea, vomiting or any other associated symptoms presently.  PAST MEDICAL HISTORY:   Past Medical History  Diagnosis Date  . Hypertension   . Dizziness     PAST SURGICAL HISTORY:   Past Surgical History  Procedure Laterality Date  . Hernia repair    . Joint replacement      left knee    SOCIAL HISTORY:   Social History  Substance Use Topics  . Smoking status: Never Smoker   . Smokeless tobacco: Not on file  . Alcohol Use: No    FAMILY HISTORY:  History reviewed. No pertinent family history.  DRUG ALLERGIES:   Allergies  Allergen Reactions  . Sulfa Antibiotics Shortness Of  Breath and Other (See Comments)    dizziness    REVIEW OF SYSTEMS:   Review of Systems  Constitutional: Positive for fever and chills. Negative for weight loss.  HENT: Negative for congestion, nosebleeds and tinnitus.   Eyes: Negative for blurred vision, double vision and redness.  Respiratory: Negative for cough, hemoptysis and shortness of breath.   Cardiovascular: Negative for chest pain, orthopnea, leg swelling and PND.  Gastrointestinal: Negative for nausea, vomiting, abdominal pain, diarrhea and melena.  Genitourinary: Negative for dysuria, urgency and hematuria.  Musculoskeletal: Negative for joint pain and falls.  Neurological: Positive for weakness. Negative for dizziness, tingling, sensory change, focal weakness, seizures and headaches.  Endo/Heme/Allergies: Negative for polydipsia. Does not bruise/bleed easily.  Psychiatric/Behavioral: Negative for depression and memory loss. The patient is not nervous/anxious.     MEDICATIONS AT HOME:   Prior to Admission medications   Medication Sig Start Date End Date Taking? Authorizing Provider  amLODipine (NORVASC) 10 MG tablet Take 10 mg by mouth daily.  06/13/15  Yes Historical Provider, MD  aspirin EC 81 MG tablet Take 81 mg by mouth daily.   Yes Historical Provider, MD  BIOTIN PO Take 1 tablet by mouth daily.   Yes Historical Provider, MD  cetirizine (ZYRTEC) 10 MG tablet Take 10 mg by mouth daily as needed for allergies or rhinitis.  03/23/16  Yes Historical Provider, MD  cholecalciferol (VITAMIN D) 1000 units tablet Take 1,000 Units by mouth daily.  Yes Historical Provider, MD  DOCQLACE 100 MG capsule Take 100 mg by mouth daily as needed for mild constipation or moderate constipation.  07/19/15  Yes Historical Provider, MD  labetalol (NORMODYNE) 100 MG tablet Take 100 mg by mouth 2 (two) times daily.  06/15/15  Yes Historical Provider, MD  meclizine (ANTIVERT) 25 MG tablet Take 1 tablet (25 mg total) by mouth 3 (three) times daily as  needed for dizziness. 08/02/15  Yes Myrna Blazer, MD  potassium chloride (K-DUR) 10 MEQ tablet Take 10 mEq by mouth daily.  06/24/15  Yes Historical Provider, MD  triamterene-hydrochlorothiazide (MAXZIDE-25) 37.5-25 MG per tablet Take 1 tablet by mouth daily. 07/04/15  Yes Historical Provider, MD  ondansetron (ZOFRAN ODT) 4 MG disintegrating tablet Take 1 tablet (4 mg total) by mouth every 8 (eight) hours as needed for nausea or vomiting. 07/13/15   Emily Filbert, MD  oxyCODONE-acetaminophen (ROXICET) 5-325 MG per tablet Take 1 tablet by mouth every 6 (six) hours as needed. 07/19/15   Ida Rogue, MD  ranitidine (ZANTAC) 150 MG tablet Take 1 tablet (150 mg total) by mouth at bedtime. 07/13/15 07/12/16  Emily Filbert, MD      VITAL SIGNS:  Blood pressure 146/96, pulse 89, temperature 102.8 F (39.3 C), temperature source Oral, resp. rate 18, height  (1.676 m), weight 76.975 kg (169 lb 11.2 oz), SpO2 96 %.  PHYSICAL EXAMINATION:  Physical Exam  GENERAL:  82 y.o.-year-old patient lying in the bed in no acute distress.  EYES: Pupils equal, round, reactive to light and accommodation. No scleral icterus. Extraocular muscles intact.  HEENT: Head atraumatic, normocephalic. Oropharynx and nasopharynx clear. No oropharyngeal erythema, moist oral mucosa  NECK:  Supple, no jugular venous distention. No thyroid enlargement, no tenderness.  LUNGS: Normal breath sounds bilaterally, no wheezing, rales, rhonchi. No use of accessory muscles of respiration.  CARDIOVASCULAR: S1, S2 RRR. II/VI SEM at LSB, No rubs, gallops, clicks.  ABDOMEN: Soft, nontender, nondistended. Bowel sounds present. No organomegaly or mass.  EXTREMITIES: No pedal edema, cyanosis, or clubbing. + 2 pedal & radial pulses b/l.   NEUROLOGIC: Cranial nerves II through XII are intact. No focal Motor or sensory deficits appreciated b/l PSYCHIATRIC: The patient is alert and oriented x 3. Good affect.  SKIN: No  obvious rash, right lower extremity ulcer with minimal yellow drainage. It is about 2 x 2 cm in size.    LABORATORY PANEL:   CBC  Recent Labs Lab 06/28/16 1706  WBC 20.0*  HGB 14.2  HCT 40.5  PLT 108*   ------------------------------------------------------------------------------------------------------------------  Chemistries   Recent Labs Lab 06/28/16 1706  NA 135  K 3.4*  CL 100*  CO2 24  GLUCOSE 107*  BUN 17  CREATININE 1.02*  CALCIUM 9.7  AST 31  ALT 17  ALKPHOS 80  BILITOT 1.4*   ------------------------------------------------------------------------------------------------------------------  Cardiac Enzymes No results for input(s): TROPONINI in the last 168 hours. ------------------------------------------------------------------------------------------------------------------  RADIOLOGY:  Dg Chest 2 View  06/28/2016  CLINICAL DATA:  Generalized weakness with dizziness and fever starting this morning. EXAM: CHEST  2 VIEW COMPARISON:  07/15/2015 FINDINGS: The lungs are clear wiithout focal pneumonia, edema, pneumothorax or pleural effusion. Chronic atelectasis or scarring at the left base is stable. Cardiopericardial silhouette is at upper limits of normal for size. Bones are diffusely demineralized. Thoracolumbar scoliosis again noted. IMPRESSION: Stable exam. Chronic atelectasis or scarring at the left lung base. No acute findings. Electronically Signed   By: Jamison Oka.D.  On: 06/28/2016 17:42     IMPRESSION AND PLAN:   82 year old female with past medical history of hypertension, GERD who presents to the hospital due to weakness, dizziness and fever and noted to be in sepsis.  1. Sepsis-patient needs criteria given her leukocytosis, fever and a right lower extremity ulcer. -Chest x-ray, urinalysis is negative for any acute pathology. -Place on IV fluids, broad-spectrum IV antibiotics with vancomycin, Zosyn. Follow blood cultures.  2. Right  lower extremity ulcer-this is a source of patient's sepsis. -Place on broad-spectrum IV antibiotics. With vancomycin, Zosyn. Get a routine consult in the morning.  3. Leukocytosis-secondary to the sepsis/right lower extremity ulcer. -Continue IV antibiotics, follow white cell count.  4. Essential hypertension-continue amlodipine, labetalol. Patient is hemodynamically stable.  5. Hypokalemia-continue oral potassium supplements. Repeat level in the morning.    All the records are reviewed and case discussed with ED provider. Management plans discussed with the patient, family and they are in agreement.  CODE STATUS: DO NOT RESUSCITATE  TOTAL TIME TAKING CARE OF THIS PATIENT: 45 minutes.    Houston Siren M.D on 06/28/2016 at 7:48 PM  Between 7am to 6pm - Pager - [phone]  After 6pm go to [email or web address] - password [email or web address] [facility]  [facility]  Office  [phone]  CC: Primary care physician; Guy Begin, MD

## 2016-06-28 NOTE — ED Notes (Signed)
Pt asks that her daughter Sherlon HandingJulene Bonsell and son Lydia GuilesCarlton Conwell be called when pt has a room ready. [phone] (home)

## 2016-06-28 NOTE — Progress Notes (Signed)
ANTIBIOTIC CONSULT NOTE - INITIAL  Pharmacy Consult for Vancomycin  Indication: sepsis  Allergies  Allergen Reactions  . Sulfa Antibiotics Shortness Of Breath and Other (See Comments)    dizziness    Patient Measurements: Height: 5\' 6"  (167.6 cm) Weight: 159 lb 9.6 oz (72.394 kg) IBW/kg (Calculated) : 59.3 Adjusted Body Weight: 66.4 kg   Vital Signs: Temp: 99.4 F (37.4 C) (07/16 2129) Temp Source: Oral (07/16 2129) BP: 132/51 mmHg (07/16 2129) Pulse Rate: 79 (07/16 2129) Intake/Output from previous day:   Intake/Output from this shift:    Labs:  Recent Labs  06/28/16 1706  WBC 20.0*  HGB 14.2  PLT 108*  CREATININE 1.02*   Estimated Creatinine Clearance: 43.3 mL/min (by C-G formula based on Cr of 1.02). No results for input(s): VANCOTROUGH, VANCOPEAK, VANCORANDOM, GENTTROUGH, GENTPEAK, GENTRANDOM, TOBRATROUGH, TOBRAPEAK, TOBRARND, AMIKACINPEAK, AMIKACINTROU, AMIKACIN in the last 72 hours.   Microbiology: No results found for this or any previous visit (from the past 720 hour(s)).  Medical History: Past Medical History  Diagnosis Date  . Hypertension   . Dizziness     Medications:  Scheduled:  . amLODipine  10 mg Oral Daily  . aspirin EC  81 mg Oral Daily  . cholecalciferol  1,000 Units Oral Daily  . enoxaparin (LOVENOX) injection  40 mg Subcutaneous Q24H  . labetalol  100 mg Oral BID  . potassium chloride  10 mEq Oral Daily  . [START ON 06/29/2016] vancomycin  1,000 mg Intravenous Q18H   Assessment: CrCl = 44.6 ml/min Ke = 0.041 hr-1 T1/2 = 16.9 hrs Vd = 53.9 L   Goal of Therapy:  Vancomycin trough level 15-20 mcg/ml  Plan:  Expected duration 7 days with resolution of temperature and/or normalization of WBC   Vancomycin 1 gm IV X 1 given on 7/16 @ 21:00. Vancomycin 1 gm IV Q18H ordered to start on 7/17 @ 3:00, ~ 6 hrs after 1st dose (stacked dosing). This pt will reach Css by 7/20 @ 9:00. Will draw 1st trough on 7/20 @ 20:30, which will be at  Css.   Zosyn 3.375 gm IV Q8H EI .   Manoah Deckard D 06/28/2016,9:44 PM

## 2016-06-28 NOTE — ED Notes (Signed)
Pt presents to ED via EMS from home c/o generalized weakness, dizziness, and fever starting this morning. Pt states she called EMS when she was unable to get out of her chair by herself today. T102.8 oral on arrival. No urinary s/sx. No c/o n/v/d or cough. Hx dizziness for which she takes meclizine.

## 2016-06-28 NOTE — ED Notes (Signed)
Notified pt's family of room assignment 103.

## 2016-06-29 LAB — CBC
HEMATOCRIT: 37.3 % (ref 35.0–47.0)
Hemoglobin: 13.2 g/dL (ref 12.0–16.0)
MCH: 32.3 pg (ref 26.0–34.0)
MCHC: 35.3 g/dL (ref 32.0–36.0)
MCV: 91.5 fL (ref 80.0–100.0)
PLATELETS: 82 10*3/uL — AB (ref 150–440)
RBC: 4.07 MIL/uL (ref 3.80–5.20)
RDW: 13 % (ref 11.5–14.5)
WBC: 10.5 10*3/uL (ref 3.6–11.0)

## 2016-06-29 LAB — BASIC METABOLIC PANEL
Anion gap: 7 (ref 5–15)
BUN: 13 mg/dL (ref 6–20)
CALCIUM: 8.5 mg/dL — AB (ref 8.9–10.3)
CO2: 24 mmol/L (ref 22–32)
Chloride: 104 mmol/L (ref 101–111)
Creatinine, Ser: 0.87 mg/dL (ref 0.44–1.00)
GFR calc Af Amer: >60 mL/min (ref >60)
Glucose, Bld: 95 mg/dL (ref 65–99)
POTASSIUM: 3.1 mmol/L — AB (ref 3.5–5.1)
SODIUM: 135 mmol/L (ref 135–145)

## 2016-06-29 MED ORDER — PANTOPRAZOLE SODIUM 40 MG PO TBEC
40.0000 mg | DELAYED_RELEASE_TABLET | Freq: Every day | ORAL | Status: DC
  Administered 2016-06-29 – 2016-06-30 (×2): 40 mg via ORAL
  Filled 2016-06-29 (×3): qty 1

## 2016-06-29 MED ORDER — FAMOTIDINE 20 MG PO TABS
40.0000 mg | ORAL_TABLET | Freq: Every day | ORAL | Status: DC
Start: 2016-06-29 — End: 2016-06-29

## 2016-06-29 MED ORDER — COLLAGENASE 250 UNIT/GM EX OINT
TOPICAL_OINTMENT | Freq: Every day | CUTANEOUS | Status: DC
Start: 2016-06-29 — End: 2016-06-30
  Administered 2016-06-29 – 2016-06-30 (×2): via TOPICAL
  Filled 2016-06-29: qty 30

## 2016-06-29 MED ORDER — POTASSIUM CHLORIDE CRYS ER 20 MEQ PO TBCR: 20.0000 meq | EXTENDED_RELEASE_TABLET | Freq: Every day | ORAL | Status: DC

## 2016-06-29 MED ORDER — POTASSIUM CHLORIDE CRYS ER 20 MEQ PO TBCR
40.0000 meq | EXTENDED_RELEASE_TABLET | Freq: Once | ORAL | Status: AC
  Administered 2016-06-29: 11:00:00 40 meq via ORAL
  Filled 2016-06-29: qty 2

## 2016-06-29 NOTE — Plan of Care (Signed)
Problem: Skin Integrity: Goal: Skin integrity will improve Outcome: Progressing Patient walked with PT today. Dressing changed per wound care instructions.

## 2016-06-29 NOTE — Plan of Care (Signed)
Problem: Education: Goal: Knowledge of [facility] Education information/materials will improve Outcome: Progressing Pt likes to be called [name]    Past Medical History   Diagnosis  Date   .  Hypertension     .  Dizziness            Pt is well controlled with home medications.

## 2016-06-29 NOTE — Progress Notes (Signed)
[facility] - [place] at [facility]   [name]    MR#:  [id number]  DATE OF BIRTH:  04-Mar-1931  SUBJECTIVE:  CHIEF COMPLAINT:   Chief Complaint  Patient presents with  . Weakness  . Dizziness  . Fever   Afebrile today. Feels stronger. Some pain around the right leg ulcer.  REVIEW OF SYSTEMS:    Review of Systems  Constitutional: Positive for fever and malaise/fatigue. Negative for chills.  HENT: Negative for sore throat.   Eyes: Negative for blurred vision, double vision and pain.  Respiratory: Negative for cough, hemoptysis, shortness of breath and wheezing.   Cardiovascular: Negative for chest pain, palpitations, orthopnea and leg swelling.  Gastrointestinal: Negative for heartburn, nausea, vomiting, abdominal pain, diarrhea and constipation.  Genitourinary: Negative for dysuria and hematuria.  Musculoskeletal: Negative for back pain and joint pain.  Skin: Negative for rash.  Neurological: Positive for weakness. Negative for sensory change, speech change, focal weakness and headaches.  Endo/Heme/Allergies: Does not bruise/bleed easily.  Psychiatric/Behavioral: Negative for depression. The patient is not nervous/anxious.     DRUG ALLERGIES:   Allergies  Allergen Reactions  . Sulfa Antibiotics Shortness Of Breath and Other (See Comments)    dizziness    VITALS:  Blood pressure 147/52, pulse 68, temperature 100 F (37.8 C), temperature source Oral, resp. rate 16, height  (1.676 m), weight 72.394 kg (159 lb 9.6 oz), SpO2 100 %.  PHYSICAL EXAMINATION:   Physical Exam  GENERAL:  82 y.o.-year-old patient lying in the bed with no acute distress.  EYES: Pupils equal, round, reactive to light and accommodation. No scleral icterus. Extraocular muscles intact.  HEENT: Head atraumatic, normocephalic. Oropharynx and nasopharynx clear.  NECK:  Supple, no jugular venous distention. No thyroid enlargement, no tenderness.  LUNGS:  Normal breath sounds bilaterally, no wheezing, rales, rhonchi. No use of accessory muscles of respiration.  CARDIOVASCULAR: S1, S2 normal. No murmurs, rubs, or gallops.  ABDOMEN: Soft, nontender, nondistended. Bowel sounds present. No organomegaly or mass.  EXTREMITIES: No cyanosis, clubbing or edema b/l.    NEUROLOGIC: Cranial nerves II through XII are intact. No focal Motor or sensory deficits b/l.   PSYCHIATRIC: The patient is alert and oriented x 3.  SKIN: 5 and a 5 cm right leg ulcer with a 2 cm open wound. Purulent discharge  LABORATORY PANEL:   CBC  Recent Labs Lab 06/29/16 0757  WBC 10.5  HGB 13.2  HCT 37.3  PLT 82*   ------------------------------------------------------------------------------------------------------------------ Chemistries   Recent Labs Lab 06/28/16 1706 06/29/16 0757  NA 135 135  K 3.4* 3.1*  CL 100* 104  CO2 24 24  GLUCOSE 107* 95  BUN 17 13  CREATININE 1.02* 0.87  CALCIUM 9.7 8.5*  AST 31  --   ALT 17  --   ALKPHOS 80  --   BILITOT 1.4*  --    ------------------------------------------------------------------------------------------------------------------  Cardiac Enzymes No results for input(s): TROPONINI in the last 168 hours. ------------------------------------------------------------------------------------------------------------------  RADIOLOGY:  Dg Chest 2 View  06/28/2016  CLINICAL DATA:  Generalized weakness with dizziness and fever starting this morning. EXAM: CHEST  2 VIEW COMPARISON:  07/15/2015 FINDINGS: The lungs are clear wiithout focal pneumonia, edema, pneumothorax or pleural effusion. Chronic atelectasis or scarring at the left base is stable. Cardiopericardial silhouette is at upper limits of normal for size. Bones are diffusely demineralized. Thoracolumbar scoliosis again noted. IMPRESSION: Stable exam. Chronic atelectasis or scarring at the left lung base. No acute findings. Electronically  Signed   By: Kennith CenterEric   Mansell M.D.   On: 06/28/2016 17:42     ASSESSMENT AND PLAN:   82 year old female with past medical history of hypertension, GERD who presents to the hospital due to weakness, dizziness and fever and noted to be in sepsis.  * Sepsis Due to right lower extremity ulcer and cellulitis Cultures pending On broad-spectrum IV antibiotics. Afebrile today  * Essential hypertension-continue amlodipine, labetalol.  * Hypokalemia- Replace orally  * Chronic mild thrombocytopenia without bleeding  All the records are reviewed and case discussed with Care Management/Social Workerr. Management plans discussed with the patient, family and they are in agreement.  CODE STATUS: FULL CODE  DVT Prophylaxis: SCDs  TOTAL TIME TAKING CARE OF THIS PATIENT: 35 minutes.   POSSIBLE D/C IN 1-2 DAYS, DEPENDING ON CLINICAL CONDITION.  [name], [name] M.D on 06/29/2016 at 12:26 PM  Between 7am to 6pm - Pager - [phone]  After 6pm go to [email or web address] - password [email or web address] [facility]  [facility] [place] [facility]  Office  [phone]  CC: Primary care physician; [name], NARGES, MD  Note: This dictation was prepared with Dragon dictation along with smaller phrase technology. Any transcriptional errors that result from this process are unintentional.

## 2016-06-29 NOTE — Care Management Important Message (Signed)
Important Message  Patient Details  Name: [name] MRN: [id number] Date of Birth: [birth year]   Medicare Important Message Given:  Yes    Mariabelen Pressly A, RN 06/29/2016, 9:44 AM

## 2016-06-29 NOTE — Consult Note (Signed)
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   Wound type:Chronic, infectious  Seen at wound care center.  Pressure Ulcer POA: N/A Measurement:5 cm x 3 cm x 0.2 cm  nonintact area is 2 cm x 2 cm with adherent slough to wound bed.  Chronic skin changes and scarring present to periwound.  Wound NWG:NFAOZHYQbed:adehrent slough Drainage (amount, consistency, odor) Moderate serosanguinous.  No odor.  Periwound:Dry skin, chronic skin changes.  Dressing procedure/placement/frequency:Cleanse wound to right lower leg with NS and pat gently dry.  Apply Santyl ointment to wound bed.  Cover with NS moist gauze.  Cover with 4x4 gauze, kerlix and tape. Change daily.  Will not follow at this time.  Please re-consult if needed.  Maple HudsonKaren Breanah Faddis RN BSN CWON Pager [phone]

## 2016-06-29 NOTE — Evaluation (Signed)
Physical Therapy Evaluation Patient Details Name: [name] MRN: [id number] DOB: 07/11/1931 Today's Date: 06/29/2016   History of Present Illness  [name] is a 82 y.o. female with a known history of Hypertension, GERD, osteoarthritis who presents to the hospital due to weakness, dizziness and having tremors. Patient was recently found to have a right lower extremity ulcer which she is being followed up at the wound center. Over the past day the family has noticed that she has been more weak not able to get around which is probably related to her right lower extremity ulcer and pain related to it. Today she was feeling quite tremulous and felt warm and was having difficulty ambulating and therefore was brought to the ER for further evaluation. Emergency room patient was noted to be febrile with temperature 102 with a leukocytosis and also noted to be tachycardic. She was diagnosed with clinical sepsis and therefore hospice services were contacted further treatment and evaluation. Patient denies any chest pain, shortness of breath, abdominal pain, nausea, vomiting or any other associated symptoms presently.  Clinical Impression  Pt demonstrates decreased LE strength/power as well as decreased safety awareness. She requires CGA for transfers and ambulation. She does stumble some with gait but is able to self correct with rolling walker. Needs to use rolling walker at discharge for safety. Pt will be safe to discharge home with family but will need HH PT due to poor balance and safety awareness as well as decreased strength. Pt will benefit from skilled PT services to address deficits in strength, balance, and mobility in order to return to full function at home.     Follow Up Recommendations Home health PT    Equipment Recommendations  None recommended by PT (Pt has rolling walker and needs to use at discharge)    Recommendations for Other Services       Precautions / Restrictions  Precautions Precautions: Fall Restrictions Weight Bearing Restrictions: No      Mobility  Bed Mobility Overal bed mobility: Needs Assistance Bed Mobility: Supine to Sit;Sit to Supine     Supine to sit: Supervision Sit to supine: Supervision   General bed mobility comments: Pt does not require external assist but requires increased time for bed mobility  Transfers Overall transfer level: Needs assistance Equipment used: Rolling walker (2 wheeled) Transfers: Sit to/from Stand Sit to Stand: Min guard         General transfer comment: Pt requires increased time for transfers due to LE weakness. However she is stable with transfers and does not require any assist for balance  Ambulation/Gait Ambulation/Gait assistance: Min guard Ambulation Distance (Feet): 100 Feet Assistive device: Rolling walker (2 wheeled) Gait Pattern/deviations: Decreased step length - right;Decreased step length - left;Shuffle     General Gait Details: Pt with decreased toe to floor clearance during ambulation. She requires assist to keep walker close to body especially during turns. Pt occasionally stumbles during ambulation but is able to self correct with rolling walker. Vitals remain WNL throughout ambulation distance  Stairs            Wheelchair Mobility    Modified Rankin (Stroke Patients Only)       Balance Overall balance assessment: Needs assistance Sitting-balance support: No upper extremity supported Sitting balance-Leahy Scale: Good     Standing balance support: No upper extremity supported Standing balance-Leahy Scale: Fair  Pertinent Vitals/Pain Pain Assessment: 0-10 Pain Score: 5  Pain Location: LLE at site of ulcer Pain Intervention(s): Monitored during session    Home Living Family/patient expects to be discharged to:: Private residence Living Arrangements: Children Available Help at Discharge: Family Type of Home:  House Home Access: Ramped entrance     Home Layout: One level Home Equipment: Walker - 2 wheels (no BSC, wc, grab bars)      Prior Function Level of Independence: Independent         Comments: Drives. Independent community ambulator without assistive device. Independent with ADLs/IADLs     Hand Dominance   Dominant Hand: Right    Extremity/Trunk Assessment   Upper Extremity Assessment: Overall WFL for tasks assessed           Lower Extremity Assessment: Generalized weakness (Deconditioning evident with mobility and MMT)         Communication   Communication: No difficulties  Cognition Arousal/Alertness: Awake/alert Behavior During Therapy: WFL for tasks assessed/performed Overall Cognitive Status: Within Functional Limits for tasks assessed                      General Comments      Exercises        Assessment/Plan    PT Assessment Patient needs continued PT services  PT Diagnosis Difficulty walking;Generalized weakness;Abnormality of gait   PT Problem List Decreased strength;Decreased activity tolerance;Decreased balance;Decreased mobility;Decreased knowledge of use of DME;Decreased safety awareness;Decreased skin integrity;Pain  PT Treatment Interventions DME instruction;Gait training;Functional mobility training;Therapeutic activities;Therapeutic exercise;Balance training;Neuromuscular re-education;Cognitive remediation;Patient/family education   PT Goals (Current goals can be found in the Care Plan section) Acute Rehab PT Goals Patient Stated Goal: Return to prior function at home PT Goal Formulation: With patient Time For Goal Achievement: 07/13/16 Potential to Achieve Goals: Good    Frequency Min 2X/week   Barriers to discharge        Co-evaluation               End of Session Equipment Utilized During Treatment: Gait belt Activity Tolerance: Patient tolerated treatment well Patient left: in bed;with call bell/phone within  reach;with bed alarm set Nurse Communication: Mobility status         Time: 4098-1191 PT Time Calculation (min) (ACUTE ONLY): 21 min   Charges:   PT Evaluation $PT Eval Low Complexity: 1 Procedure     PT G Codes:       Sharalyn Ink Huprich PT, DPT   Huprich,Jason 06/29/2016, 4:24 PM

## 2016-06-30 ENCOUNTER — Ambulatory Visit: Payer: Medicare Other | Admitting: Internal Medicine

## 2016-06-30 LAB — URINE CULTURE

## 2016-06-30 LAB — CBC WITH DIFFERENTIAL/PLATELET
BASOS ABS: 0.1 10*3/uL (ref 0–0.1)
BASOS PCT: 1 %
EOS ABS: 0.6 10*3/uL (ref 0–0.7)
Eosinophils Relative: 8 %
HCT: 35.8 % (ref 35.0–47.0)
HEMOGLOBIN: 12.6 g/dL (ref 12.0–16.0)
Lymphocytes Relative: 16 %
Lymphs Abs: 1.2 10*3/uL (ref 1.0–3.6)
MCH: 32.9 pg (ref 26.0–34.0)
MCHC: 35.3 g/dL (ref 32.0–36.0)
MCV: 93.1 fL (ref 80.0–100.0)
MONO ABS: 0.7 10*3/uL (ref 0.2–0.9)
Monocytes Relative: 10 %
NEUTROS PCT: 65 %
Neutro Abs: 4.6 10*3/uL (ref 1.4–6.5)
Platelets: 87 10*3/uL — ABNORMAL LOW (ref 150–440)
RBC: 3.84 MIL/uL (ref 3.80–5.20)
RDW: 13.3 % (ref 11.5–14.5)
WBC: 7.2 10*3/uL (ref 3.6–11.0)

## 2016-06-30 LAB — BASIC METABOLIC PANEL
ANION GAP: 7 (ref 5–15)
BUN: 12 mg/dL (ref 6–20)
CALCIUM: 8.5 mg/dL — AB (ref 8.9–10.3)
CO2: 23 mmol/L (ref 22–32)
Chloride: 109 mmol/L (ref 101–111)
Creatinine, Ser: 0.85 mg/dL (ref 0.44–1.00)
Glucose, Bld: 92 mg/dL (ref 65–99)
POTASSIUM: 3.2 mmol/L — AB (ref 3.5–5.1)
SODIUM: 139 mmol/L (ref 135–145)

## 2016-06-30 LAB — MAGNESIUM: MAGNESIUM: 1.8 mg/dL (ref 1.7–2.4)

## 2016-06-30 MED ORDER — POTASSIUM CHLORIDE CRYS ER 20 MEQ PO TBCR
40.0000 meq | EXTENDED_RELEASE_TABLET | ORAL | Status: AC
  Administered 2016-06-30 (×2): 40 meq via ORAL
  Filled 2016-06-30 (×2): qty 2

## 2016-06-30 MED ORDER — AMOXICILLIN-POT CLAVULANATE 875-125 MG PO TABS: 1.0000 | ORAL_TABLET | Freq: Two times a day (BID) | ORAL | Status: DC

## 2016-06-30 MED ORDER — COLLAGENASE 250 UNIT/GM EX OINT: TOPICAL_OINTMENT | Freq: Every day | CUTANEOUS | Status: DC

## 2016-06-30 NOTE — Care Management Note (Addendum)
Case Management Note  Patient Details  Name: [name] MRN: [id number] Date of Birth: 02-10-1931  Subjective/Objective:      Have been unable to contact daughter Loa SocksJulene, but contacted son Lydia GuilesCarlton Loux who stated that he would pick up [name] in approximately 45 minutes for transport home. Discussed discharge planning with [name]. A referral for home health RN and PT [facility] [place] [facility]. [facility] does not service her home zip code area. .               Action/Plan:   Expected Discharge Date:                  Expected Discharge Plan:     In-House Referral:     Discharge planning Services     Post Acute Care Choice:    Choice offered to:     DME Arranged:    DME Agency:     HH Arranged:    HH Agency:     Status of Service:     If discussed at Long Length of Stay Meetings, dates discussed:    Additional Comments:  Lawsen Arnott A, RN 06/30/2016, 3:41 PM

## 2016-06-30 NOTE — Progress Notes (Signed)
Patient discharged home with home health. All discharge instructions given and all questions answered. 

## 2016-06-30 NOTE — Progress Notes (Signed)
Pharmacy Antibiotic Note  [name] is a 82 y.o. female admitted on 06/28/2016 with sepsis secondary to right lower extremity ulcer.  Pharmacy has been consulted for Vancomycin and Zosyn dosing.  Plan: Vancomycin 1g IV Q18hr for goal trough of 15-20. Trough scheduled for 7/20 at 2030.    Zosyn EI 3.375g IV Q8hr.    Height: 5\' 6"  (167.6 cm) Weight: 159 lb 9.6 oz (72.394 kg) IBW/kg (Calculated) : 59.3  Temp (24hrs), Avg:98.9 F (37.2 C), Min:98.3 F (36.8 C), Max:100 F (37.8 C)   Recent Labs Lab 06/28/16 1706 06/29/16 0757 06/30/16 0617  WBC 20.0* 10.5 7.2  CREATININE 1.02* 0.87 0.85    Estimated Creatinine Clearance: 52 mL/min (by C-G formula based on Cr of 0.85).    Allergies  Allergen Reactions  . Sulfa Antibiotics Shortness Of Breath and Other (See Comments)    dizziness    Antimicrobials this admission: Vancomycin 7/16 >>  Zosyn 7/16 >>   Dose adjustments this admission: N/A  Microbiology results: 7/16 BCx: no growth x 2 days  7/16 UCx: pending   Pharmacy will continue to monitor and adjust per consult.    [name] L 06/30/2016 8:49 AM

## 2016-06-30 NOTE — Care Management Note (Signed)
Case Management Note  Patient Details  Name: Alesia BandaCecil J Salonga MRN: [id number] Date of Birth: 09/18/1931  Subjective/Objective:          Call to Sherlon HandingJulene Boulter patients daughter to discuss discharge planning and to notify her that Ms Toni ArthursFuller is being discharged from the hospital today. Left message on answering machine requesting a call back.          Action/Plan:   Expected Discharge Date:                  Expected Discharge Plan:     In-House Referral:     Discharge planning Services     Post Acute Care Choice:    Choice offered to:     DME Arranged:    DME Agency:     HH Arranged:    HH Agency:     Status of Service:     If discussed at [employer] Length of Stay Meetings, dates discussed:    Additional Comments:  Atina Feeley A, RN 06/30/2016, 2:20 PM

## 2016-06-30 NOTE — Progress Notes (Signed)
Physical Therapy Treatment Patient Details Name: Jordan BandaCecil J Schmader MRN: [id number] DOB: 10-18-1931 Today's Date: 06/30/2016    History of Present Illness [name] Boggess is a 82 y.o. female with a known history of Hypertension, GERD, osteoarthritis who presents to the hospital due to weakness, dizziness and having tremors. Patient was recently found to have a right lower extremity ulcer which she is being followed up at the wound center. Over the past day the family has noticed that she has been more weak not able to get around which is probably related to her right lower extremity ulcer and pain related to it. Today she was feeling quite tremulous and felt warm and was having difficulty ambulating and therefore was brought to the ER for further evaluation. Emergency room patient was noted to be febrile with temperature 102 with a leukocytosis and also noted to be tachycardic. She was diagnosed with clinical sepsis and therefore hospice services were contacted further treatment and evaluation. Patient denies any chest pain, shortness of breath, abdominal pain, nausea, vomiting or any other associated symptoms presently.    PT Comments    Pt in bed ready for session.  Transitions in and out of bed without assist but increased time and rail.  She was able to ambulate around unit with walker.  She is unsteady at times but able to recover without assist.  Upon return to room she requested to use the bathroom.  She was hesitant to take walker in with her and education provided.  She stated she has a walker at home which she did not use prior to admission.  Education was provided on it's use to increase safety at this time.  Participated in exercises as described below.   Follow Up Recommendations  Home health PT     Equipment Recommendations  None recommended by PT    Recommendations for Other Services       Precautions / Restrictions Precautions Precautions: Fall Restrictions Weight Bearing  Restrictions: No    Mobility  Bed Mobility Overal bed mobility: Modified Independent Bed Mobility: Supine to Sit;Sit to Supine     Supine to sit: Modified independent (Device/Increase time) Sit to supine: Modified independent (Device/Increase time)   General bed mobility comments: uses rail and increased time  Transfers Overall transfer level: Needs assistance Equipment used: Rolling walker (2 wheeled) Transfers: Sit to/from Stand Sit to Stand: Min guard         General transfer comment: decreased safety at times, weakness  Ambulation/Gait Ambulation/Gait assistance: Min guard Ambulation Distance (Feet): 160 Feet Assistive device: Rolling walker (2 wheeled) Gait Pattern/deviations: Step-through pattern;Decreased stance time - right   Gait velocity interpretation: Below normal speed for age/gender General Gait Details: No stumbling noted today but unsteady at times, able to correct without assist   Stairs            Wheelchair Mobility    Modified Rankin (Stroke Patients Only)       Balance Overall balance assessment: Needs assistance Sitting-balance support: Feet supported Sitting balance-Leahy Scale: Good     Standing balance support: No upper extremity supported Standing balance-Leahy Scale: Fair                      Cognition Arousal/Alertness: Awake/alert Behavior During Therapy: WFL for tasks assessed/performed Overall Cognitive Status: Within Functional Limits for tasks assessed                      Exercises General Exercises -  Lower Extremity Ankle Circles/Pumps: AROM;Both;20 reps;Seated Long Arc Quad: AROM;Both;20 reps;Seated Straight Leg Raises: AROM;Both;Supine;10 reps Hip Flexion/Marching: AROM;Both;10 reps;Seated    General Comments        Pertinent Vitals/Pain Pain Assessment: 0-10 Pain Score: 5  Pain Location: LLE ulcer site Pain Intervention(s): Monitored during session    Home Living                       Prior Function            PT Goals (current goals can now be found in the care plan section) Progress towards PT goals: Progressing toward goals    Frequency  Min 2X/week    PT Plan Current plan remains appropriate    Co-evaluation             End of Session Equipment Utilized During Treatment: Gait belt Activity Tolerance: Patient tolerated treatment well Patient left: in bed;with call bell/phone within reach;with bed alarm set     Time: 1610-9604 PT Time Calculation (min) (ACUTE ONLY): 23 min  Charges:  $Gait Training: 8-22 mins $Therapeutic Exercise: 8-22 mins                    G Codes:      Danielle Dess, PTA 06/30/2016, 9:42 AM

## 2016-06-30 NOTE — Discharge Instructions (Signed)
Resume diet and activity as before.  Cleanse wound to right lower leg with NS and pat gently dry. Apply Santyl ointment to wound bed. Cover with NS moist gauze. Cover with 4x4 gauze, kerlix and tape. Change daily.

## 2016-07-02 NOTE — Discharge Summary (Signed)
[facility] - [place] at [facility]   [name]    MR#:  [id number]  DATE OF BIRTH:  December 22, 1930  DATE OF ADMISSION:  06/28/2016 ADMITTING PHYSICIAN: Houston Siren, MD  DATE OF DISCHARGE: 06/30/2016  4:40 PM  PRIMARY CARE PHYSICIAN: Guy Begin, MD   ADMISSION DIAGNOSIS:  Cellulitis of right lower extremity [L03.115]  DISCHARGE DIAGNOSIS:  Active Problems:   Sepsis (HCC)   SECONDARY DIAGNOSIS:   Past Medical History  Diagnosis Date  . Hypertension   . Dizziness      ADMITTING HISTORY  [name] is a 82 y.o. female with a known history of Hypertension, GERD, osteoarthritis who presents to the hospital due to weakness, dizziness and having tremors. Patient was recently found to have a right lower extremity ulcer which she is being followed up at the wound center. Over the past day the family has noticed that she has been more weak not able to get around which is probably related to her right lower extremity ulcer and pain related to it. Today she was feeling quite tremulous and felt warm and was having difficulty ambulating and therefore was brought to the ER for further evaluation. Emergency room patient was noted to be febrile with temperature 102 with a leukocytosis and also noted to be tachycardic. She was diagnosed with clinical sepsis and therefore hospice services were contacted further treatment and evaluation. Patient denies any chest pain, shortness of breath, abdominal pain, nausea, vomiting or any other associated symptoms presently.  HOSPITAL COURSE:   82 year old female with past medical history of hypertension, GERD who presents to the hospital due to weakness, dizziness and fever and noted to be in sepsis.  * Sepsis Due to right lower extremity ulcer and cellulitis Cultures negative On broad-spectrum IV antibiotics in the hospital. We'll change to Augmentin at discharge. Afebrile today Home health set up for wound  dressing changes.  * Essential hypertension-continue amlodipine, labetalol.  * Hypokalemia- Replace orally Resolved  * Chronic mild thrombocytopenia without bleeding  Stable for discharge home with home health physical therapy and nursing. Patient has follow-up at wound care center.  CONSULTS OBTAINED:     DRUG ALLERGIES:   Allergies  Allergen Reactions  . Sulfa Antibiotics Shortness Of Breath and Other (See Comments)    dizziness    DISCHARGE MEDICATIONS:   Discharge Medication List as of 06/30/2016  3:43 PM    START taking these medications   Details  amoxicillin-clavulanate (AUGMENTIN) 875-125 MG tablet Take 1 tablet by mouth 2 (two) times daily., Starting 06/30/2016, Until Discontinued, Normal    collagenase (SANTYL) ointment Apply topically daily., Starting 06/30/2016, Until Discontinued, Normal      CONTINUE these medications which have NOT CHANGED   Details  amLODipine (NORVASC) 10 MG tablet Take 10 mg by mouth daily. , Starting 06/13/2015, Until Discontinued, Historical Med    aspirin EC 81 MG tablet Take 81 mg by mouth daily., Until Discontinued, Historical Med    BIOTIN PO Take 1 tablet by mouth daily., Until Discontinued, Historical Med    cetirizine (ZYRTEC) 10 MG tablet Take 10 mg by mouth daily as needed for allergies or rhinitis. , Starting 03/23/2016, Until Discontinued, Historical Med    cholecalciferol (VITAMIN D) 1000 units tablet Take 1,000 Units by mouth daily., Until Discontinued, Historical Med    DOCQLACE 100 MG capsule Take 100 mg by mouth daily as needed for mild constipation or moderate constipation. , Starting 07/19/2015, Until Discontinued, Historical Med  labetalol (NORMODYNE) 100 MG tablet Take 100 mg by mouth 2 (two) times daily. , Starting 06/15/2015, Until Discontinued, Historical Med    meclizine (ANTIVERT) 25 MG tablet Take 1 tablet (25 mg total) by mouth 3 (three) times daily as needed for dizziness., Starting 08/02/2015, Until  Discontinued, Print    potassium chloride (K-DUR) 10 MEQ tablet Take 10 mEq by mouth daily. , Starting 06/24/2015, Until Discontinued, Historical Med    triamterene-hydrochlorothiazide (MAXZIDE-25) 37.5-25 MG per tablet Take 1 tablet by mouth daily., Starting 07/04/2015, Until Discontinued, Historical Med    ondansetron (ZOFRAN ODT) 4 MG disintegrating tablet Take 1 tablet (4 mg total) by mouth every 8 (eight) hours as needed for nausea or vomiting., Starting 07/13/2015, Until Discontinued, Print    oxyCODONE-acetaminophen (ROXICET) 5-325 MG per tablet Take 1 tablet by mouth every 6 (six) hours as needed., Starting 07/19/2015, Until Discontinued, Print    ranitidine (ZANTAC) 150 MG tablet Take 1 tablet (150 mg total) by mouth at bedtime., Starting 07/13/2015, Until Sun 07/12/16, Print        Today   VITAL SIGNS:  Blood pressure 129/58, pulse 59, temperature 98.1 F (36.7 C), temperature source Oral, resp. rate 20, height  (1.676 m), weight 72.394 kg (159 lb 9.6 oz), SpO2 100 %.  I/O:  No intake or output data in the 24 hours ending 07/02/16 1201  PHYSICAL EXAMINATION:  Physical Exam  GENERAL:  82 y.o.-year-old patient lying in the bed with no acute distress.  LUNGS: Normal breath sounds bilaterally, no wheezing, rales,rhonchi or crepitation. No use of accessory muscles of respiration.  CARDIOVASCULAR: S1, S2 normal. No murmurs, rubs, or gallops.  ABDOMEN: Soft, non-tender, non-distended. Bowel sounds present. No organomegaly or mass.  NEUROLOGIC: Moves all 4 extremities. PSYCHIATRIC: The patient is alert and oriented x 3.  SKIN: Right leg ulcer 5 x 5 cm  DATA REVIEW:   CBC  Recent Labs Lab 06/30/16 0617  WBC 7.2  HGB 12.6  HCT 35.8  PLT 87*    Chemistries   Recent Labs Lab 06/28/16 1706  06/30/16 0617  NA 135  < > 139  K 3.4*  < > 3.2*  CL 100*  < > 109  CO2 24  < > 23  GLUCOSE 107*  < > 92  BUN 17  < > 12  CREATININE 1.02*  < > 0.85  CALCIUM 9.7  < > 8.5*   MG  --   --  1.8  AST 31  --   --   ALT 17  --   --   ALKPHOS 80  --   --   BILITOT 1.4*  --   --   < > = values in this interval not displayed.  Cardiac Enzymes No results for input(s): TROPONINI in the last 168 hours.  Microbiology Results  Results for orders placed or performed during the hospital encounter of 06/28/16  Blood culture (routine x 2)     Status: None (Preliminary result)   Collection Time: 06/28/16  5:06 PM  Result Value Ref Range Status   Specimen Description BLOOD RIGHT FOREARM  Final   Special Requests BOTTLES DRAWN AEROBIC AND ANAEROBIC 1CC  Final   Culture NO GROWTH 4 DAYS  Final   Report Status PENDING  Incomplete  Blood culture (routine x 2)     Status: None (Preliminary result)   Collection Time: 06/28/16  5:06 PM  Result Value Ref Range Status   Specimen Description BLOOD LEFT ASSIST CONTROL  Final   Special Requests BOTTLES DRAWN AEROBIC AND ANAEROBIC 5CC  Final   Culture NO GROWTH 4 DAYS  Final   Report Status PENDING  Incomplete  Urine culture     Status: Abnormal   Collection Time: 06/28/16  5:06 PM  Result Value Ref Range Status   Specimen Description URINE, RANDOM  Final   Special Requests NONE  Final   Culture MULTIPLE SPECIES PRESENT, SUGGEST RECOLLECTION (A)  Final   Report Status 06/30/2016 FINAL  Final    RADIOLOGY:  No results found.  Follow up with PCP in 1 week.  Management plans discussed with the patient, family and they are in agreement.  CODE STATUS:  Code Status History    Date Active Date Inactive Code Status Order ID Comments User Context   06/28/2016  9:28 PM 06/30/2016  8:26 PM DNR [id number]  Houston SirenVivek J Sainani, MD Inpatient    Questions for Most Recent Historical Code Status (Order [id number])    Question Answer Comment   In the event of cardiac or respiratory ARREST Do not call a "code blue"    In the event of cardiac or respiratory ARREST Do not perform Intubation, CPR, defibrillation or ACLS    In the event of cardiac  or respiratory ARREST Use medication by any route, position, wound care, and other measures to relive pain and suffering. May use oxygen, suction and manual treatment of airway obstruction as needed for comfort.     Advance Directive Documentation        Most Recent Value   Type of Advance Directive  Living will   Pre-existing out of facility DNR order (yellow form or pink MOST form)     "MOST" Form in Place?        TOTAL TIME TAKING CARE OF THIS PATIENT ON DAY OF DISCHARGE: more than 30 minutes.   Milagros LollSudini, Janine Reller R M.D on 07/02/2016 at 12:01 PM  Between 7am to 6pm - Pager - [phone]  After 6pm go to [email or web address] - password [email or web address] [email or web address]  [facility]  Office  [phone]  CC: Primary care physician; [name], NARGES, MD  Note: This dictation was prepared with Dragon dictation along with smaller phrase technology. Any transcriptional errors that result from this process are unintentional.

## 2016-07-03 LAB — CULTURE, BLOOD (ROUTINE X 2)
CULTURE: NO GROWTH
Culture: NO GROWTH

## 2016-07-07 ENCOUNTER — Encounter: Payer: Medicare Other | Admitting: Internal Medicine

## 2016-07-07 DIAGNOSIS — I70232 Atherosclerosis of native arteries of right leg with ulceration of calf: Secondary | ICD-10-CM | POA: Diagnosis not present

## 2016-07-07 DIAGNOSIS — L03115 Cellulitis of right lower limb: Secondary | ICD-10-CM | POA: Diagnosis not present

## 2016-07-07 DIAGNOSIS — M1990 Unspecified osteoarthritis, unspecified site: Secondary | ICD-10-CM | POA: Diagnosis not present

## 2016-07-07 DIAGNOSIS — L97211 Non-pressure chronic ulcer of right calf limited to breakdown of skin: Secondary | ICD-10-CM | POA: Diagnosis present

## 2016-07-07 DIAGNOSIS — I10 Essential (primary) hypertension: Secondary | ICD-10-CM | POA: Diagnosis not present

## 2016-07-08 NOTE — Progress Notes (Signed)
[name], [name] ([id number]) Visit Report for 07/07/2016 Chief Complaint Document Details [name], [name]. Date of Service: 07/07/2016 1:30 PM Medical Record Patient Account Number: [id number] Number: Treating RN: [name] [name] (82 y.o. Other Clinician: Date of Birth/Sex: Female) Treating Bayden Gil Primary Care Physician/Extender: Letitia Libra, NARGES Physician: Referring Physician: Guy Begin Weeks in Treatment: 3 Information Obtained from: Patient Chief Complaint 06/10/16; the patient Jordan here for review of a wound on the right anterior medial lower leg above the ankle Electronic Signature(s) Signed: 07/08/2016 7:54:01 AM By: Baltazar Najjar MD Entered By: Baltazar Najjar on 07/07/2016 14:16:09 [name], [name] ([id number]) -------------------------------------------------------------------------------- Debridement Details [name] Date of Service: 07/07/2016 1:30 PM Medical Record Patient Account Number: [id number] Number: Treating RN: [name] [name] (82 y.o. Other Clinician: Date of Birth/Sex: Female) Treating Mikhai Bienvenue Primary Care Physician/Extender: Letitia Libra, NARGES Physician: Referring Physician: Guy Begin Weeks in Treatment: 3 Debridement Performed for Wound #1 Right,Medial Lower Leg Assessment: Performed By: Physician Maxwell Caul, MD Debridement: Debridement Pre-procedure Yes Verification/Time Out Taken: Start Time: 14:07 Pain Control: Other : lidocaine 4% cream Level: Skin/Subcutaneous Tissue Total Area Debrided (L x 4.7 (cm) x 3.2 (cm) = 15.04 (cm) W): Tissue and other Viable, Non-Viable, Exudate, Fibrin/Slough, Subcutaneous material debrided: Instrument: Curette Bleeding: Minimum Hemostasis Achieved: Pressure End Time: 14:10 Procedural Pain: 0 Post Procedural Pain: 0 Response to Treatment: Procedure was tolerated well Post Debridement Measurements  of Total Wound Length: (cm) 4.7 Width: (cm) 3.2 Depth: (cm) 0.1 Volume: (cm) 1.181 Post Procedure Diagnosis Same as Pre-procedure Electronic Signature(s) Signed: 07/07/2016 3:59:42 PM By: [name] Signed: 07/08/2016 7:54:01 AM By: Baltazar Najjar MD Entered By: [name] on 07/07/2016 14:17:59 [name], [name] ([id number]) [name], [name] ([id number]) -------------------------------------------------------------------------------- HPI Details [name] Date of Service: 07/07/2016 1:30 PM Medical Record Patient Account Number: [id number] Number: Treating RN: [name] [name] (82 y.o. Other Clinician: Date of Birth/Sex: Female) Treating Keigan Girten Primary Care Physician/Extender: Letitia Libra, NARGES Physician: Referring Physician: Guy Begin Weeks in Treatment: 3 History of Present Illness HPI Description: 06/10/16; this Jordan an 82 year old woman who lives near [place] with family members. She saw her primary doctor roughly over a week ago and was referred to the ER for a cellulitis in the right medial leg. She was given a round of IV antibiotics in the emergency room and discharged on oral Keflex which she Jordan taking. She Jordan been left with a uncomfortable leg area on the right medial lower leg. Her ABIs in this clinic were 0.8 on the right 0.6 on the left. She does not describe claudication. She Jordan not a diabetic. Lab work in the ER showed a normal BUN and creatinine on 6/20 at 18 and 0.92 respectively white count was 10.1 hemoglobin at 13.8. She did not have any imaging studies. I can see no vascular evaluations in Epic. She does not have a prior wound history 06/17/16; the area affected on her right medial ankle Jordan an area I thought was due to tissue damage from cellulitis last week. She Jordan going for venous reflux studies this afternoon, these were not ordered in this clinic at least not by me. The patient does not give a  prior history of damage to the skin in this area although I wonder if she actually might not of noticed it. We have been using's Aquacel Ag under a wrap although we will not be able to wrap her today 06/24/16: pt  reports today she hasn't heard from the vascular clinic regarding proceeding with arterial studies. our office will assist with this today. she denies s/s of infection. denies problems with her wraps. 25/17; since I last saw this woman she was admitted to hospital from 7/16 through 7/18 with sepsis syndrome felt to be secondary to her ulcer. She was treated with Vanco and Zosyn the hospital discharged on Septra. Blood cultures were negative. Urine culture showed multiple species she Jordan now feeling well using Santyl with border foam to her wound. [facility] has [facility] Electronic Signature(s) Signed: 07/08/2016 7:54:01 AM By: Baltazar Najjar MD Entered By: Baltazar Najjar on 07/07/2016 14:18:21 Dimaano, [name] ([id number]) -------------------------------------------------------------------------------- Physical Exam Details [name], [name]. Date of Service: 07/07/2016 1:30 PM Medical Record Patient Account Number: [id number] Number: Treating RN: [name] [name] (82 y.o. Other Clinician: Date of Birth/Sex: Female) Treating Tonica Brasington Primary Care Physician/Extender: Letitia Libra, NARGES Physician: Referring Physician: Guy Begin Weeks in Treatment: 3 Notes Wound exam; the areas on her right medial leg. Listed as 5 x 5 cm in her discharge summary this Jordan nowhere near that big. Debrided of some surface slough nonviable subcutaneous tissue there Jordan no evidence of surrounding infection here. Pedal pulses are palpable Electronic Signature(s) Signed: 07/08/2016 7:54:01 AM By: Baltazar Najjar MD Entered By: Baltazar Najjar on 07/07/2016 14:19:41 Hensley, [name]  ([id number]) -------------------------------------------------------------------------------- Physician Orders Details [name] Date of Service: 07/07/2016 1:30 PM Medical Record Patient Account Number: [id number] Number: Treating RN: [name] Jordan 01, Hopkins (82 y.o. Other Clinician: Date of Birth/Sex: Female) Treating Meta Kroenke Primary Care Physician/Extender: Letitia Libra, NARGES Physician: Referring Physician: [name] in Treatment: 3 Verbal / Phone Orders: Yes Clinician: Pinkerton, Debi Read Back and Verified: Yes Diagnosis Coding ICD-10 Coding Code Description 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down of skin L03.115 Cellulitis of right lower limb I70.232 Atherosclerosis of native arteries of right leg with ulceration of calf Wound Cleansing Wound #1 Right,Medial Lower Leg o Cleanse wound with mild soap and water Skin Barriers/Peri-Wound Care Wound #1 Right,Medial Lower Leg o Skin Prep o Moisturizing lotion - to dry skin Primary Wound Dressing Wound #1 Right,Medial Lower Leg o Santyl Ointment Secondary Dressing Wound #1 Right,Medial Lower Leg o Dry Gauze o Boardered Foam Dressing Dressing Change Frequency Wound #1 Right,Medial Lower Leg o Change dressing every other day. Follow-up Appointments Wound #1 Right,Medial Lower Leg o Return Appointment in 1 week. [name], SEABOLT ([id number]) Edema Control Wound #1 Right,Medial Lower Leg o Elevate legs to the level of the heart and pump ankles as often as possible Additional Orders / Instructions Wound #1 Right,Medial Lower Leg o Increase protein intake. o Activity as tolerated Home Health Wound #1 Right,Medial Lower Leg o Continue Home Health Visits - [facility] o Home Health Nurse Jordan visit PRN to address patientos wound care needs. o FACE TO FACE ENCOUNTER: MEDICARE and MEDICAID PATIENTS: I certify that this  patient Jordan under my care and that I had a face-to-face encounter that meets the physician face-to-face encounter requirements with this patient on this date. The encounter with the patient was in whole or in part for the following MEDICAL CONDITION: (primary reason for Home Healthcare) MEDICAL NECESSITY: I certify, that based on my findings, NURSING services are a medically necessary home health service. HOME BOUND STATUS: I certify that my clinical findings support that this patient Jordan homebound (i.e., Due to illness or injury, pt requires aid  of supportive devices such as crutches, cane, wheelchairs, walkers, the use of special transportation or the assistance of another person to leave their place of residence. There Jordan a normal inability to leave the home and doing so requires considerable and taxing effort. Other absences are for medical reasons / religious services and are infrequent or of short duration when for other reasons). o If current dressing causes regression in wound condition, Jordan D/C ordered dressing product/s and apply Normal Saline Moist Dressing daily until next Wound Healing Center / Other MD appointment. Notify Wound Healing Center of regression in wound condition at [phone]. o Please direct any NON-WOUND related issues/requests for orders to patient's Primary Care Physician Electronic Signature(s) Signed: 07/07/2016 3:59:42 PM By: [name] Signed: 07/08/2016 7:54:01 AM By: Baltazar Najjar MD Entered By: [name] on 07/07/2016 14:20:59 [name], [name] ([id number]) -------------------------------------------------------------------------------- Problem List Details [name], [name]. Date of Service: 07/07/2016 1:30 PM Medical Record Patient Account Number: [id number] Number: Treating RN: [name] April [birth year], Hopkins (82 y.o. Other Clinician: Date of Birth/Sex: Female) Treating Kesley Gaffey Primary Care  Physician/Extender: Letitia Libra, NARGES Physician: Referring Physician: Guy Begin Weeks in Treatment: 3 Active Problems ICD-10 Encounter Code Description Active Date Diagnosis L97.211 Non-pressure chronic ulcer of right calf limited to 06/10/2016 Yes breakdown of skin L03.115 Cellulitis of right lower limb 06/10/2016 Yes I70.232 Atherosclerosis of native arteries of right leg with 06/10/2016 Yes ulceration of calf Inactive Problems Resolved Problems Electronic Signature(s) Signed: 07/08/2016 7:54:01 AM By: Baltazar Najjar MD Entered By: Baltazar Najjar on 07/07/2016 14:15:39 Haymond, [name] ([id number]) -------------------------------------------------------------------------------- Progress Note Details [name] Date of Service: 07/07/2016 1:30 PM Medical Record Patient Account Number: [id number] Number: Treating RN: [name] [birth year]-Hopkins (82 y.o. Other Clinician: Date of Birth/Sex: Female) Treating Ridge Lafond Primary Care Physician/Extender: Letitia Libra, NARGES Physician: Referring Physician: Guy Begin Weeks in Treatment: 3 Subjective Chief Complaint Information obtained from Patient 06/10/16; the patient Jordan here for review of a wound on the right anterior medial lower leg above the ankle History of Present Illness (HPI) 06/10/16; this Jordan an 82 year old woman who lives near [place] with family members. She saw her primary doctor roughly over a week ago and was referred to the ER for a cellulitis in the right medial leg. She was given a round of IV antibiotics in the emergency room and discharged on oral Keflex which she Jordan taking. She Jordan been left with a uncomfortable leg area on the right medial lower leg. Her ABIs in this clinic were 0.8 on the right 0.6 on the left. She does not describe claudication. She Jordan not a diabetic. Lab work in the ER showed a normal BUN and creatinine on 6/20 at 18 and 0.92 respectively white count was  10.1 hemoglobin at 13.8. She did not have any imaging studies. I can see no vascular evaluations in Epic. She does not have a prior wound history 06/17/16; the area affected on her right medial ankle Jordan an area I thought was due to tissue damage from cellulitis last week. She Jordan going for venous reflux studies this afternoon, these were not ordered in this clinic at least not by me. The patient does not give a prior history of damage to the skin in this area although I wonder if she actually might not of noticed it. We have been using's Aquacel Ag under a wrap although we will not be able to wrap her today 06/24/16: pt reports today she hasn't heard  from the vascular clinic regarding proceeding with arterial studies. our office will assist with this today. she denies s/s of infection. denies problems with her wraps. 25/17; since I last saw this woman she was admitted to hospital from 7/16 through 7/18 with sepsis syndrome felt to be secondary to her ulcer. She was treated with Vanco and Zosyn the hospital discharged on Septra. Blood cultures were negative. Urine culture showed multiple species she Jordan now feeling well using Santyl with border foam to her wound. [facility] has [facility] Objective [name], [name]. ([id number]) Constitutional Vitals Time Taken: 1:52 PM, Height: 61 in, Weight: 170 lbs, BMI: 32.1, Temperature: 98.3 F, Pulse: 85 bpm, Respiratory Rate: 18 breaths/min, Blood Pressure: 138/71 mmHg. Integumentary (Hair, Skin) Wound #1 status Jordan Open. Original cause of wound was Gradually Appeared. The wound Jordan located on the Right,Medial Lower Leg. The wound measures 4.7cm length x 3.2cm width x 0.1cm depth; 11.812cm^2 area and 1.181cm^3 volume. The wound Jordan limited to skin breakdown. There Jordan no tunneling or undermining noted. There Jordan a large amount of serosanguineous drainage noted. The wound margin Jordan indistinct and nonvisible. There Jordan medium (34-66%) pink, pale granulation  within the wound bed. There Jordan a medium (34- 66%) amount of necrotic tissue within the wound bed including Adherent Slough. The periwound skin appearance exhibited: Dry/Scaly. The periwound skin appearance did not exhibit: Callus, Crepitus, Excoriation, Fluctuance, Friable, Induration, Localized Edema, Rash, Scarring, Maceration, Moist, Atrophie Blanche, Cyanosis, Ecchymosis, Hemosiderin Staining, Mottled, Pallor, Rubor, Erythema. Periwound temperature was noted as Cool/Cold. The periwound has tenderness on palpation. Assessment Active Problems ICD-10 L97.211 - Non-pressure chronic ulcer of right calf limited to breakdown of skin L03.115 - Cellulitis of right lower limb I70.232 - Atherosclerosis of native arteries of right leg with ulceration of calf Procedures Wound #1 Wound #1 Jordan a Venous Leg Ulcer located on the Right,Medial Lower Leg . There was a Skin/Subcutaneous Tissue Debridement ([id number]) debridement with total area of 15.04 sq cm performed by Maxwell Caul, MD. with the following instrument(s): Curette to remove Viable and Non-Viable tissue/material including Exudate, Fibrin/Slough, and Subcutaneous after achieving pain control using Other (lidocaine 4% cream). A time out was conducted prior to the start of the procedure. A Minimum amount of bleeding was controlled with Pressure. The procedure was tolerated well with a pain level of 0 throughout and a pain level of 0 following the procedure. Post Debridement Measurements: 4.7cm length x 3.2cm width x 0.1cm depth; 1.181cm^3 volume. Post procedure Diagnosis Wound #1: Same as Pre-Procedure [name], [name]. ([id number]) Plan Wound Cleansing: Wound #1 Right,Medial Lower Leg: Cleanse wound with mild soap and water Primary Wound Dressing: Wound #1 Right,Medial Lower Leg: Santyl Ointment Secondary Dressing: Wound #1 Right,Medial Lower Leg: Dry Gauze Boardered Foam Dressing Dressing Change Frequency: Wound #1 Right,Medial  Lower Leg: Change dressing every other day. Follow-up Appointments: Wound #1 Right,Medial Lower Leg: Return Appointment in 1 week. Edema Control: Wound #1 Right,Medial Lower Leg: Elevate legs to the level of the heart and pump ankles as often as possible Additional Orders / Instructions: Wound #1 Right,Medial Lower Leg: Increase protein intake. Activity as tolerated Home Health: Wound #1 Right,Medial Lower Leg: Continue Home Health Visits - [facility] Nurse Jordan visit PRN to address patient s wound care needs. FACE TO FACE ENCOUNTER: MEDICARE and MEDICAID PATIENTS: I certify that this patient Jordan under my care and that I had a face-to-face encounter that meets the physician face-to-face encounter requirements with this patient on this date.  The encounter with the patient was in whole or in part for the following MEDICAL CONDITION: (primary reason for Home Healthcare) MEDICAL NECESSITY: I certify, that based on my findings, NURSING services are a medically necessary home health service. HOME BOUND STATUS: I certify that my clinical findings support that this patient Jordan homebound (i.e., Due to illness or injury, pt requires aid of supportive devices such as crutches, cane, wheelchairs, walkers, the use of special transportation or the assistance of another person to leave their place of residence. There Jordan a normal inability to leave the home and doing so requires considerable and taxing effort. Other absences are for medical reasons / religious services and are infrequent or of short duration when for other reasons). If current dressing causes regression in wound condition, Jordan D/C ordered dressing product/s and apply Normal Saline Moist Dressing daily until next Wound Healing Center / Other MD appointment. Notify Wound Healing Center of regression in wound condition at [phone]. Please direct any NON-WOUND related issues/requests for orders to patient's Primary Care  Physician MORIAH, GUARNIERI ([id number]) we will continue with santyl and border foam. she was discharged on Augmentin which she should have completed Electronic Signature(s) Signed: 07/08/2016 7:54:01 AM By: Baltazar Najjar MD Entered By: Baltazar Najjar on 07/07/2016 14:21:22 Salih, [name] ([id number]) -------------------------------------------------------------------------------- SuperBill Details [name] Date of Service: 07/07/2016 Medical Record Patient Account Number: [id number] Number: Treating RN: [name] [birth year] (82 y.o. Other Clinician: Date of Birth/Sex: Female) Treating Mariame Rybolt Primary Care Physician/Extender: Letitia Libra, NARGES Physician: Weeks in Treatment: 3 Referring Physician: Guy Begin Diagnosis Coding ICD-10 Codes Code Description [phone] Non-pressure chronic ulcer of right calf limited to breakdown of skin L03.115 Cellulitis of right lower limb I70.232 Atherosclerosis of native arteries of right leg with ulceration of calf Facility Procedures CPT4 Code Description: [id number] 11042 - DEB SUBQ TISSUE 20 SQ CM/< ICD-10 Description Diagnosis 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 Modifier: reakdown of ski Quantity: 1 n Physician Procedures CPT4 Code Description: [id number] 11042 - WC PHYS SUBQ TISS 20 SQ CM ICD-10 Description Diagnosis 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 Modifier: reakdown of ski Quantity: 1 n Electronic Signature(s) Signed: 07/08/2016 7:54:01 AM By: Baltazar Najjar MD Entered By: Baltazar Najjar on 07/07/2016 14:21:58

## 2016-07-08 NOTE — Progress Notes (Signed)
MATISON, NUCCIO ([id number]) Visit Report for 07/07/2016 Arrival Information Details Patient Name: SYLVANIA, MOSS. Date of Service: 07/07/2016 1:30 PM Medical Record Patient Account Number: [id number] Number: Treating RN: [name] November 08, 1931 (82 y.o. Other Clinician: Date of Birth/Sex: Female) Treating ROBSON, MICHAEL Primary Care Physician: Guy Begin Physician/Extender: G Referring Physician: [name] in Treatment: 3 Visit Information History Since Last Visit All ordered tests and consults were completed: No Patient Arrived: Ambulatory Added or deleted any medications: No Arrival Time: 13:50 Any new allergies or adverse reactions: No Accompanied By: self Had a fall or experienced change in No Transfer Assistance: None activities of daily living that may affect Patient Identification Verified: Yes risk of falls: Secondary Verification Process Yes Signs or symptoms of abuse/neglect since last No Completed: visito Patient Requires Transmission-Based No Hospitalized since last visit: No Precautions: Pain Present Now: No Patient Has Alerts: No Electronic Signature(s) Signed: 07/07/2016 3:59:42 PM By: [name] Entered By: [name] on 07/07/2016 13:51:55 Hartung, [name] ([id number]) -------------------------------------------------------------------------------- Encounter Discharge Information Details Patient Name: [name]. Date of Service: 07/07/2016 1:30 PM Medical Record Patient Account Number: [id number] Number: Treating RN: [name] [birth year] (82 y.o. Other Clinician: Date of Birth/Sex: Female) Treating ROBSON, MICHAEL Primary Care Physician: Guy Begin Physician/Extender: G Referring Physician: [name] in Treatment: 3 Encounter Discharge Information Items Discharge Pain Level: 0 Discharge Condition: Stable Ambulatory Status: Ambulatory Discharge Destination:  Home Transportation: Private Auto Accompanied By: self Schedule Follow-up Appointment: Yes Medication Reconciliation completed and provided to Patient/Care Yes Steph Cheadle: Provided on Clinical Summary of Care: 07/07/2016 Form Type Recipient Paper Patient CF Electronic Signature(s) Signed: 07/07/2016 2:22:27 PM By: Gwenlyn Perking Entered By: Gwenlyn Perking on 07/07/2016 14:22:27 Debose, [name] ([id number]) -------------------------------------------------------------------------------- Lower Extremity Assessment Details Patient Name: [name]. Date of Service: 07/07/2016 1:30 PM Medical Record Patient Account Number: [id number] Number: Treating RN: [name] Jan 25, 1931 (82 y.o. Other Clinician: Date of Birth/Sex: Female) Treating ROBSON, MICHAEL Primary Care Physician: Guy Begin Physician/Extender: G Referring Physician: Guy Begin Weeks in Treatment: 3 Vascular Assessment Pulses: Posterior Tibial Dorsalis Pedis Palpable: [Right:Yes] Extremity colors, hair growth, and conditions: Extremity Color: [Right:Normal] Temperature of Extremity: [Right:Warm] Capillary Refill: [Right:< 3 seconds] Toe Nail Assessment Left: Right: Thick: No Discolored: No Deformed: No Improper Length and Hygiene: No Electronic Signature(s) Signed: 07/07/2016 3:59:42 PM By: [name] Entered By: [name] on 07/07/2016 13:54:43 Lute, Amyrah Shela Commons ([id number]) -------------------------------------------------------------------------------- Multi Wound Chart Details Patient Name: [name]. Date of Service: 07/07/2016 1:30 PM Medical Record Patient Account Number: [id number] Number: Treating RN: [name] 1931-01-26 (82 y.o. Other Clinician: Date of Birth/Sex: Female) Treating ROBSON, MICHAEL Primary Care Physician: Guy Begin Physician/Extender: G Referring Physician: Guy Begin Weeks in Treatment: 3 Vital  Signs Height(in): 61 Pulse(bpm): 85 Weight(lbs): 170 Blood Pressure 138/71 (mmHg): Body Mass Index(BMI): 32 Temperature(F): 98.3 Respiratory Rate 18 (breaths/min): Photos: [1:No Photos] [N/A:N/A] Wound Location: [1:Right Lower Leg - Medial] [N/A:N/A] Wounding Event: [1:Gradually Appeared] [N/A:N/A] Primary Etiology: [1:Venous Leg Ulcer] [N/A:N/A] Comorbid History: [1:Cataracts, Arrhythmia, Hypertension, Osteoarthritis] [N/A:N/A] Date Acquired: [1:06/01/2016] [N/A:N/A] Weeks of Treatment: [1:3] [N/A:N/A] Wound Status: [1:Open] [N/A:N/A] Measurements L x W x D 4.7x3.2x0.1 [N/A:N/A] (cm) Area (cm) : [1:11.812] [N/A:N/A] Volume (cm) : [1:1.181] [N/A:N/A] % Reduction in Area: [1:62.40%] [N/A:N/A] % Reduction in Volume: 62.40% [N/A:N/A] Classification: [1:Partial Thickness] [N/A:N/A] Exudate Amount: [1:Large] [N/A:N/A] Exudate Type: [1:Serosanguineous] [N/A:N/A] Exudate Color: [1:red, brown] [N/A:N/A] Foul Odor After [1:Yes] [N/A:N/A] Cleansing: Odor Anticipated Due to No [N/A:N/A] Product Use:  Wound Margin: [1:Indistinct, nonvisible] [N/A:N/A] Granulation Amount: [1:Medium (34-66%)] [N/A:N/A] Granulation Quality: [1:Pink, Pale] [N/A:N/A] Necrotic Amount: [1:Medium (34-66%)] [N/A:N/A] Exposed Structures: Fascia: No N/A N/A Fat: No Tendon: No Muscle: No Joint: No Bone: No Limited to Skin Breakdown Epithelialization: None N/A N/A Periwound Skin Texture: Edema: No N/A N/A Excoriation: No Induration: No Callus: No Crepitus: No Fluctuance: No Friable: No Rash: No Scarring: No Periwound Skin Dry/Scaly: Yes N/A N/A Moisture: Maceration: No Moist: No Periwound Skin Color: Atrophie Blanche: No N/A N/A Cyanosis: No Ecchymosis: No Erythema: No Hemosiderin Staining: No Mottled: No Pallor: No Rubor: No Temperature: Cool/Cold N/A N/A Tenderness on Yes N/A N/A Palpation: Wound Preparation: Ulcer Cleansing: N/A N/A Rinsed/Irrigated with Saline Topical  Anesthetic Applied: Other: Lidocaine 4% Treatment Notes Electronic Signature(s) Signed: 07/07/2016 3:59:42 PM By: [name] Entered By: [name] on 07/07/2016 14:00:24 MALLOREY, ODONELL ([id number]) -------------------------------------------------------------------------------- Multi-Disciplinary Care Plan Details Patient Name: SWEET, JARVIS. Date of Service: 07/07/2016 1:30 PM Medical Record Patient Account Number: [id number] Number: Treating RN: [name] 1931/12/12 (82 y.o. Other Clinician: Date of Birth/Sex: Female) Treating ROBSON, MICHAEL Primary Care Physician: Guy Begin Physician/Extender: G Referring Physician: [name] in Treatment: 3 Active Inactive Orientation to the Wound Care Program Nursing Diagnoses: Knowledge deficit related to the wound healing center program Goals: Patient/caregiver will verbalize understanding of the Wound Healing Center Program Date Initiated: 06/10/2016 Goal Status: Active Interventions: Provide education on orientation to the wound center Notes: Venous Leg Ulcer Nursing Diagnoses: Potential for venous Insuffiency (use before diagnosis confirmed) Goals: Non-invasive venous studies are completed as ordered Date Initiated: 06/10/2016 Goal Status: Active Patient will maintain optimal edema control Date Initiated: 06/10/2016 Goal Status: Active Patient/caregiver will verbalize understanding of disease process and disease management Date Initiated: 06/10/2016 Goal Status: Active Verify adequate tissue perfusion prior to therapeutic compression application Date Initiated: 06/10/2016 Goal Status: Active Interventions: ABERDEEN, HAFEN. ([id number]) Assess peripheral edema status every visit. Compression as ordered Provide education on venous insufficiency Treatment Activities: Non-invasive vascular studies : 06/10/2016 Notes: Wound/Skin Impairment Nursing Diagnoses: Impaired tissue  integrity Knowledge deficit related to ulceration/compromised skin integrity Goals: Patient/caregiver will verbalize understanding of skin care regimen Date Initiated: 06/10/2016 Goal Status: Active Ulcer/skin breakdown will have a volume reduction of 30% by week 4 Date Initiated: 06/10/2016 Goal Status: Active Ulcer/skin breakdown will have a volume reduction of 50% by week 8 Date Initiated: 06/10/2016 Goal Status: Active Ulcer/skin breakdown will have a volume reduction of 80% by week 12 Date Initiated: 06/10/2016 Goal Status: Active Ulcer/skin breakdown will heal within 14 weeks Date Initiated: 06/10/2016 Goal Status: Active Interventions: Assess patient/caregiver ability to obtain necessary supplies Assess patient/caregiver ability to perform ulcer/skin care regimen upon admission and as needed Assess ulceration(s) every visit Provide education on ulcer and skin care Treatment Activities: Skin care regimen initiated : 06/10/2016 Topical wound management initiated : 06/10/2016 Notes: Electronic Signature(s) SAMARA, STANKOWSKI ([id number]) Signed: 07/07/2016 3:59:42 PM By: [name] Entered By: [name] on 07/07/2016 14:00:16 Schwering, [name] ([id number]) -------------------------------------------------------------------------------- Pain Assessment Details Patient Name: [name]. Date of Service: 07/07/2016 1:30 PM Medical Record Patient Account Number: [id number] Number: Treating RN: [name] 1931-07-22 (82 y.o. Other Clinician: Date of Birth/Sex: Female) Treating ROBSON, MICHAEL Primary Care Physician: Guy Begin Physician/Extender: G Referring Physician: Guy Begin Weeks in Treatment: 3 Active Problems Location of Pain Severity and Description of Pain Patient Has Paino No Site Locations With Dressing Change: No Pain Management and Medication Current Pain Management: Electronic Signature(s) Signed: 07/07/2016  3:59:42 PM By:  [name] Entered By: [name] on 07/07/2016 13:52:02 Eichenberger, [name] ([id number]) -------------------------------------------------------------------------------- Patient/Caregiver Education Details Patient Name: [name] Date of Service: 07/07/2016 1:30 PM Medical Record Patient Account Number: [id number] Number: Treating RN: [name] 09-02-1931 (82 y.o. Other Clinician: Date of Birth/Gender: Female) Treating ROBSON, MICHAEL Primary Care Physician: Guy Begin Physician/Extender: G Referring Physician: [name] in Treatment: 3 Education Assessment Education Provided To: Patient Education Topics Provided Wound/Skin Impairment: Handouts: Other: change dressing as ordered Methods: Demonstration, Explain/Verbal Responses: State content correctly Electronic Signature(s) Signed: 07/07/2016 3:59:42 PM By: [name] Entered By: [name] on 07/07/2016 14:20:35 Mcdiarmid, Darcelle Shela Commons ([id number]) -------------------------------------------------------------------------------- Wound Assessment Details Patient Name: [name]. Date of Service: 07/07/2016 1:30 PM Medical Record Patient Account Number: [id number] Number: Treating RN: [name] [birth year] (82 y.o. Other Clinician: Date of Birth/Sex: Female) Treating ROBSON, MICHAEL Primary Care Physician: Guy Begin Physician/Extender: G Referring Physician: Guy Begin Weeks in Treatment: 3 Wound Status Wound Number: 1 Primary Venous Leg Ulcer Etiology: Wound Location: Right Lower Leg - Medial Wound Status: Open Wounding Event: Gradually Appeared Comorbid Cataracts, Arrhythmia, Hypertension, Date Acquired: 06/01/2016 History: Osteoarthritis Weeks Of Treatment: 3 Clustered Wound: No Photos Wound Measurements Length: (cm) 4.7 Width: (cm) 3.2 Depth: (cm) 0.1 Area: (cm) 11.812 Volume: (cm) 1.181 % Reduction in Area: 62.4% %  Reduction in Volume: 62.4% Epithelialization: None Tunneling: No Undermining: No Wound Description Classification: Partial Thickness Foul Odor After Wound Margin: Indistinct, nonvisible Due to Product U Exudate Amount: Large Exudate Type: Serosanguineous Exudate Color: red, brown Cleansing: Yes se: No Wound Bed Granulation Amount: Medium (34-66%) Exposed Structure Granulation Quality: Pink, Pale Fascia Exposed: No Necrotic Amount: Medium (34-66%) Fat Layer Exposed: No Crampton, Miki J. ([id number]) Necrotic Quality: Adherent Slough Tendon Exposed: No Muscle Exposed: No Joint Exposed: No Bone Exposed: No Limited to Skin Breakdown Periwound Skin Texture Texture Color No Abnormalities Noted: No No Abnormalities Noted: No Callus: No Atrophie Blanche: No Crepitus: No Cyanosis: No Excoriation: No Ecchymosis: No Fluctuance: No Erythema: No Friable: No Hemosiderin Staining: No Induration: No Mottled: No Localized Edema: No Pallor: No Rash: No Rubor: No Scarring: No Temperature / Pain Moisture Temperature: Cool/Cold No Abnormalities Noted: No Tenderness on Palpation: Yes Dry / Scaly: Yes Maceration: No Moist: No Wound Preparation Ulcer Cleansing: Rinsed/Irrigated with Saline Topical Anesthetic Applied: Other: Lidocaine 4%, Treatment Notes Wound #1 (Right, Medial Lower Leg) 1. Cleansed with: Clean wound with Normal Saline 2. Anesthetic Topical Lidocaine 4% cream to wound bed prior to debridement 3. Peri-wound Care: Skin Prep 4. Dressing Applied: Santyl Ointment 5. Secondary Dressing Applied Bordered Foam Dressing Dry Gauze Notes Patient going to the hospital to have a venous duplex studies done Electronic Signature(s) Signed: 07/07/2016 3:56:52 PM By: Hulan Saas, ASHLEYNICOLE MCCLEES ([id number]) Signed: 07/07/2016 3:59:42 PM By: [name] Entered By: Curtis Sites on 07/07/2016 15:35:28 Mandella, [name]  ([id number]) -------------------------------------------------------------------------------- Vitals Details Patient Name: [name]. Date of Service: 07/07/2016 1:30 PM Medical Record Patient Account Number: [id number] Number: Treating RN: [name] [birth year] (82 y.o. Other Clinician: Date of Birth/Sex: Female) Treating ROBSON, MICHAEL Primary Care Physician: Guy Begin Physician/Extender: G Referring Physician: Guy Begin Weeks in Treatment: 3 Vital Signs Time Taken: 13:52 Temperature (F): 98.3 Height (in): 61 Pulse (bpm): 85 Weight (lbs): 170 Respiratory Rate (breaths/min): 18 Body Mass Index (BMI): 32.1 Blood Pressure (mmHg): 138/71 Reference Range: 80 - 120 mg / dl Electronic Signature(s) Signed: 07/07/2016 3:59:42 PM By: [name] Entered By:  [name] on 07/07/2016 13:53:45

## 2016-07-14 ENCOUNTER — Encounter: Payer: Medicare Other | Attending: Internal Medicine | Admitting: Internal Medicine

## 2016-07-14 DIAGNOSIS — M1990 Unspecified osteoarthritis, unspecified site: Secondary | ICD-10-CM | POA: Diagnosis not present

## 2016-07-14 DIAGNOSIS — I70232 Atherosclerosis of native arteries of right leg with ulceration of calf: Secondary | ICD-10-CM | POA: Diagnosis not present

## 2016-07-14 DIAGNOSIS — I10 Essential (primary) hypertension: Secondary | ICD-10-CM | POA: Diagnosis not present

## 2016-07-14 DIAGNOSIS — L97211 Non-pressure chronic ulcer of right calf limited to breakdown of skin: Secondary | ICD-10-CM | POA: Insufficient documentation

## 2016-07-14 DIAGNOSIS — L03115 Cellulitis of right lower limb: Secondary | ICD-10-CM | POA: Insufficient documentation

## 2016-07-15 NOTE — Progress Notes (Addendum)
[name] ([id number]) Visit Report for 07/14/2016 Arrival Information Details [name], [name]. Date of Service: 07/14/2016 1:30 PM Medical Record Patient Account Number: [id number] Number: Treating RN: [name] December 14, 1931 (82 y.o. Other Clinician: Date of Birth/Sex: Female) Treating ROBSON, MICHAEL Primary Care Physician: Guy Begin Physician/Extender: G Referring Physician: [name] in Treatment: 4 Visit Information History Since Last Visit All ordered tests and consults were completed: No Patient Arrived: Cane Added or deleted any medications: No Arrival Time: 13:41 Any new allergies or adverse reactions: No Accompanied By: self Had a fall or experienced change in No Transfer Assistance: None activities of daily living that may affect Patient Identification Verified: Yes risk of falls: Secondary Verification Process Completed: Yes Signs or symptoms of abuse/neglect since last No Patient Requires Transmission-Based No visito Precautions: Hospitalized since last visit: No Patient Has Alerts: No Pain Present Now: No Electronic Signature(s) Signed: 07/14/2016 4:35:54 PM By: [name] Entered By: [name] on 07/14/2016 13:42:13 [name] ([id number]) -------------------------------------------------------------------------------- Encounter Discharge Information Details [name] Date of Service: 07/14/2016 1:30 PM Medical Record Patient Account Number: [id number] Number: Treating RN: [name] [birth year] (82 y.o. Other Clinician: Date of Birth/Sex: Female) Treating ROBSON, MICHAEL Primary Care Physician: Guy Begin Physician/Extender: G Referring Physician: [name] in Treatment: 4 Encounter Discharge Information Items Discharge Pain Level: 0 Discharge Condition: Stable Ambulatory Status: Cane Discharge Destination: Home Transportation: Private  Auto Accompanied By: self Schedule Follow-up Appointment: Yes Medication Reconciliation completed Yes and provided to Patient/Care [name]: Provided on Clinical Summary of Care: 07/14/2016 Form Type Recipient Paper Patient CF Electronic Signature(s) Signed: 07/14/2016 2:31:28 PM By: Gwenlyn Perking Entered By: Gwenlyn Perking on 07/14/2016 14:31:28 [name] ([id number]) -------------------------------------------------------------------------------- Lower Extremity Assessment Details [name] Date of Service: 07/14/2016 1:30 PM Medical Record Patient Account Number: [id number] Number: Treating RN: [name] [birth year] (82 y.o. Other Clinician: Date of Birth/Sex: Female) Treating ROBSON, MICHAEL Primary Care Physician: Guy Begin Physician/Extender: G Referring Physician: Guy Begin Weeks in Treatment: 4 Vascular Assessment Pulses: Posterior Tibial Dorsalis Pedis Palpable: [Right:Yes] Extremity colors, hair growth, and conditions: Extremity Color: [Right:Normal] Temperature of Extremity: [Right:Warm] Capillary Refill: [Right:< 3 seconds] Toe Nail Assessment Left: Right: Thick: No Discolored: No Deformed: No Improper Length and Hygiene: No Electronic Signature(s) Signed: 07/14/2016 4:35:54 PM By: [name] Entered By: [name] on 07/14/2016 13:45:50 [name] ([id number]) -------------------------------------------------------------------------------- Multi Wound Chart Details [name] Date of Service: 07/14/2016 1:30 PM Medical Record Patient Account Number: [id number] Number: Treating RN: [name] [birth year] (82 y.o. Other Clinician: Date of Birth/Sex: Female) Treating ROBSON, MICHAEL Primary Care Physician: Guy Begin Physician/Extender: G Referring Physician: Guy Begin Weeks in Treatment: 4 Vital Signs Height(in): 61 Pulse(bpm): 70 Weight(lbs):  170 Blood Pressure 138/73 (mmHg): Body Mass Index(BMI): 32 Temperature(F): 98.0 Respiratory Rate 18 (breaths/min): Photos: [1:No Photos] [N/A:N/A] Wound Location: [1:Right Lower Leg - Medial] [N/A:N/A] Wounding Event: [1:Gradually Appeared] [N/A:N/A] Primary Etiology: [1:Venous Leg Ulcer] [N/A:N/A] Comorbid History: [1:Cataracts, Arrhythmia, Hypertension, Osteoarthritis] [N/A:N/A] Date Acquired: [1:06/01/2016] [N/A:N/A] Weeks of Treatment: [1:4] [N/A:N/A] Wound Status: [1:Open] [N/A:N/A] Measurements L x W x D 4.5x3.2x0.2 [N/A:N/A] (cm) Area (cm) : [1:11.31] [N/A:N/A] Volume (cm) : [1:2.262] [N/A:N/A] % Reduction in Area: [1:64.00%] [N/A:N/A] % Reduction in Volume: 28.00% [N/A:N/A] Classification: [1:Partial Thickness] [N/A:N/A] Exudate Amount: [1:Large] [N/A:N/A] Exudate Type: [1:Serosanguineous] [N/A:N/A] Exudate Color: [1:red, brown] [N/A:N/A] Foul Odor After [1:Yes] [N/A:N/A] Cleansing: Odor Anticipated Due to No [N/A:N/A] Product Use:  Wound Margin: [1:Indistinct, nonvisible] [N/A:N/A] Granulation Amount: [1:Medium (34-66%)] [N/A:N/A] Granulation Quality: [1:Pink, Pale] [N/A:N/A] Necrotic Amount: [1:Medium (34-66%)] [N/A:N/A] Exposed Structures: Fascia: No N/A N/A Fat: No Tendon: No Muscle: No Joint: No Bone: No Limited to Skin Breakdown Epithelialization: None N/A N/A Periwound Skin Texture: Edema: No N/A N/A Excoriation: No Induration: No Callus: No Crepitus: No Fluctuance: No Friable: No Rash: No Scarring: No Periwound Skin Dry/Scaly: Yes N/A N/A Moisture: Maceration: No Moist: No Periwound Skin Color: Atrophie Blanche: No N/A N/A Cyanosis: No Ecchymosis: No Erythema: No Hemosiderin Staining: No Mottled: No Pallor: No Rubor: No Temperature: Cool/Cold N/A N/A Tenderness on Yes N/A N/A Palpation: Wound Preparation: Ulcer Cleansing: N/A N/A Rinsed/Irrigated with Saline Topical Anesthetic Applied: Other: Lidocaine 4% Treatment  Notes Electronic Signature(s) Signed: 07/14/2016 4:35:54 PM By: [name] Entered By: [name] on 07/14/2016 13:48:53 [name] ([id number]) -------------------------------------------------------------------------------- Multi-Disciplinary Care Plan Details Patient Name: YANA, SCHORR. Date of Service: 07/14/2016 1:30 PM Medical Record Patient Account Number: [id number] Number: Treating RN: [name] Dec 23, 1930 (82 y.o. Other Clinician: Date of Birth/Sex: Female) Treating ROBSON, MICHAEL Primary Care Physician: Guy Begin Physician/Extender: G Referring Physician: [name] in Treatment: 4 Active Inactive Orientation to the Wound Care Program Nursing Diagnoses: Knowledge deficit related to the wound healing center program Goals: Patient/caregiver will verbalize understanding of the Wound Healing Center Program Date Initiated: 06/10/2016 Goal Status: Active Interventions: Provide education on orientation to the wound center Notes: Venous Leg Ulcer Nursing Diagnoses: Potential for venous Insuffiency (use before diagnosis confirmed) Goals: Non-invasive venous studies are completed as ordered Date Initiated: 06/10/2016 Goal Status: Active Patient will maintain optimal edema control Date Initiated: 06/10/2016 Goal Status: Active Patient/caregiver will verbalize understanding of disease process and disease management Date Initiated: 06/10/2016 Goal Status: Active Verify adequate tissue perfusion prior to therapeutic compression application Date Initiated: 06/10/2016 Goal Status: Active Interventions: ZAYANA, SALVADOR. ([id number]) Assess peripheral edema status every visit. Compression as ordered Provide education on venous insufficiency Treatment Activities: Non-invasive vascular studies : 06/10/2016 Notes: Wound/Skin Impairment Nursing Diagnoses: Impaired tissue integrity Knowledge deficit related to ulceration/compromised  skin integrity Goals: Patient/caregiver will verbalize understanding of skin care regimen Date Initiated: 06/10/2016 Goal Status: Active Ulcer/skin breakdown will have a volume reduction of 30% by week 4 Date Initiated: 06/10/2016 Goal Status: Active Ulcer/skin breakdown will have a volume reduction of 50% by week 8 Date Initiated: 06/10/2016 Goal Status: Active Ulcer/skin breakdown will have a volume reduction of 80% by week 12 Date Initiated: 06/10/2016 Goal Status: Active Ulcer/skin breakdown will heal within 14 weeks Date Initiated: 06/10/2016 Goal Status: Active Interventions: Assess patient/caregiver ability to obtain necessary supplies Assess patient/caregiver ability to perform ulcer/skin care regimen upon admission and as needed Assess ulceration(s) every visit Provide education on ulcer and skin care Treatment Activities: Skin care regimen initiated : 06/10/2016 Topical wound management initiated : 06/10/2016 Notes: Electronic Signature(s) [name], [name] ([id number]) Signed: 07/14/2016 4:35:54 PM By: [name] Entered By: [name] on 07/14/2016 13:48:47 [name], [name] ([id number]) -------------------------------------------------------------------------------- Pain Assessment Details [name] Date of Service: 07/14/2016 1:30 PM Medical Record Patient Account Number: [id number] Number: Treating RN: [name] 04/29/1931 (82 y.o. Other Clinician: Date of Birth/Sex: Female) Treating ROBSON, MICHAEL Primary Care Physician: Guy Begin Physician/Extender: G Referring Physician: Guy Begin Weeks in Treatment: 4 Active Problems Location of Pain Severity and Description of Pain Patient Has Paino No Site Locations With Dressing Change: No Pain Management and Medication Current Pain Management: Electronic Signature(s) Signed: 07/14/2016  4:35:54 PM By: [name] Entered By: [name] on 07/14/2016  13:42:18 Bena, [name] ([id number]) -------------------------------------------------------------------------------- Patient/Caregiver Education Details [name] Date of Service: 07/14/2016 1:30 PM Medical Record Patient Account Number: [id number] Number: Treating RN: [name] 04/02/1931 (82 y.o. Other Clinician: Date of Birth/Gender: Female) Treating ROBSON, MICHAEL Primary Care Physician: Guy Begin Physician/Extender: G Referring Physician: [name] in Treatment: 4 Education Assessment Education Provided To: Patient Education Topics Provided Wound/Skin Impairment: Handouts: Other: change dressing as ordered Methods: Demonstration, Explain/Verbal Responses: State content correctly Electronic Signature(s) Signed: 07/14/2016 4:35:54 PM By: [name] Entered By: [name] on 07/14/2016 13:53:23 [name], [name] ([id number]) -------------------------------------------------------------------------------- Wound Assessment Details [name] Date of Service: 07/14/2016 1:30 PM Medical Record Patient Account Number: [id number] Number: Treating RN: [name] [birth year] (82 y.o. Other Clinician: Date of Birth/Sex: Female) Treating ROBSON, MICHAEL Primary Care Physician: Guy Begin Physician/Extender: G Referring Physician: Guy Begin Weeks in Treatment: 4 Wound Status Wound Number: 1 Primary Venous Leg Ulcer Etiology: Wound Location: Right Lower Leg - Medial Wound Status: Open Wounding Event: Gradually Appeared Comorbid Cataracts, Arrhythmia, Hypertension, Date Acquired: 06/01/2016 History: Osteoarthritis Weeks Of Treatment: 4 Clustered Wound: No Photos Photo Uploaded By: [name] on 07/14/2016 13:52:40 Wound Measurements Length: (cm) 4.5 Width: (cm) 3.2 Depth: (cm) 0.2 Area: (cm) 11.31 Volume: (cm) 2.262 % Reduction in Area: 64% % Reduction in  Volume: 28% Epithelialization: None Tunneling: No Undermining: No Wound Description Classification: Partial Thickness Wound Margin: Indistinct, nonvisible Exudate Amount: Large Exudate Type: Serosanguineous Exudate Color: red, brown Foul Odor After Cleansing: Yes Due to Product Use: No Wound Bed Granulation Amount: Medium (34-66%) Exposed Structure Granulation Quality: Pink, Pale Fascia Exposed: No Necrotic Amount: Medium (34-66%) Fat Layer Exposed: No Hopkins, Jordan J. ([id number]) Necrotic Quality: Adherent Slough Tendon Exposed: No Muscle Exposed: No Joint Exposed: No Bone Exposed: No Limited to Skin Breakdown Periwound Skin Texture Texture Color No Abnormalities Noted: No No Abnormalities Noted: No Callus: No Atrophie Blanche: No Crepitus: No Cyanosis: No Excoriation: No Ecchymosis: No Fluctuance: No Erythema: No Friable: No Hemosiderin Staining: No Induration: No Mottled: No Localized Edema: No Pallor: No Rash: No Rubor: No Scarring: No Temperature / Pain Moisture Temperature: Cool/Cold No Abnormalities Noted: No Tenderness on Palpation: Yes Dry / Scaly: Yes Maceration: No Moist: No Wound Preparation Ulcer Cleansing: Rinsed/Irrigated with Saline Topical Anesthetic Applied: Other: Lidocaine 4%, Treatment Notes Wound #1 (Right, Medial Lower Leg) 1. Cleansed with: Clean wound with Normal Saline 2. Anesthetic Topical Lidocaine 4% cream to wound bed prior to debridement 4. Dressing Applied: Iodoflex 5. Secondary Dressing Applied ABD Pad Dry Gauze 7. Secured with Tape 3 Layer Compression System - Right Lower Extremity Notes unna to anchor Electronic Signature(s) PHYILLIS, DASCOLI ([id number]) Signed: 07/14/2016 4:35:54 PM By: [name] Entered By: [name] on 07/14/2016 13:47:40 Seth, [name] ([id number]) -------------------------------------------------------------------------------- Vitals Details [name] Date of Service: 07/14/2016 1:30 PM Medical Record Patient Account Number: [id number] Number: Treating RN: [name] [birth year] (82 y.o. Other Clinician: Date of Birth/Sex: Female) Treating ROBSON, MICHAEL Primary Care Physician: Guy Begin Physician/Extender: G Referring Physician: Guy Begin Weeks in Treatment: 4 Vital Signs Time Taken: 13:43 Temperature (F): 98.0 Height (in): 61 Pulse (bpm): 70 Weight (lbs): 170 Respiratory Rate (breaths/min): 18 Body Mass Index (BMI): 32.1 Blood Pressure (mmHg): 138/73 Reference Range: 80 - 120 mg / dl Electronic Signature(s) Signed: 07/14/2016 4:35:54 PM By: [name] Entered By: [name] on 07/14/2016  13:44:05 

## 2016-07-16 ENCOUNTER — Other Ambulatory Visit: Payer: Self-pay | Admitting: Internal Medicine

## 2016-07-16 ENCOUNTER — Ambulatory Visit: Payer: Medicare Other

## 2016-07-16 DIAGNOSIS — L97909 Non-pressure chronic ulcer of unspecified part of unspecified lower leg with unspecified severity: Secondary | ICD-10-CM

## 2016-07-16 NOTE — Progress Notes (Signed)
LAQUANDRA, CARRILLO ([id number]) Visit Report for 07/14/2016 Chief Complaint Document Details [name], [name]. Date of Service: 07/14/2016 1:30 PM Medical Record Patient Account Number: [id number] Number: Treating RN: [name] February 25, 1931 (82 y.o. Other Clinician: Date of Birth/Sex: Female) Treating ROBSON, MICHAEL Primary Care Physician/Extender: Letitia Libra, NARGES Physician: Referring Physician: Guy Begin Weeks in Treatment: 4 Information Obtained from: Patient Chief Complaint 06/10/16; the patient is here for review of a wound on the right anterior medial lower leg above the ankle Electronic Signature(s) Signed: 07/15/2016 7:57:27 AM By: Baltazar Najjar MD Entered By: Baltazar Najjar on 07/15/2016 07:42:57 [name], [name] ([id number]) -------------------------------------------------------------------------------- Debridement Details [name] Date of Service: 07/14/2016 1:30 PM Medical Record Patient Account Number: [id number] Number: Treating RN: [name] 09-Sep-1931 (82 y.o. Other Clinician: Date of Birth/Sex: Female) Treating ROBSON, MICHAEL Primary Care Physician/Extender: Letitia Libra, NARGES Physician: Referring Physician: Guy Begin Weeks in Treatment: 4 Debridement Performed for Wound #1 Right,Medial Lower Leg Assessment: Performed By: Physician Maxwell Caul, MD Debridement: Debridement Pre-procedure Yes Verification/Time Out Taken: Start Time: 14:03 Pain Control: Other : lidocaine 4% cream Level: Skin/Subcutaneous Tissue Total Area Debrided (L x 4.5 (cm) x 3.2 (cm) = 14.4 (cm) W): Tissue and other Viable, Non-Viable, Exudate, Fibrin/Slough, Subcutaneous material debrided: Instrument: Curette Bleeding: Minimum Hemostasis Achieved: Pressure End Time: 14:06 Procedural Pain: 0 Post Procedural Pain: 0 Response to Treatment: Procedure was tolerated well Post Debridement Measurements of  Total Wound Length: (cm) 4.5 Width: (cm) 3.2 Depth: (cm) 0.2 Volume: (cm) 2.262 Post Procedure Diagnosis Same as Pre-procedure Electronic Signature(s) Signed: 07/15/2016 7:57:27 AM By: Baltazar Najjar MD Signed: 07/15/2016 4:44:15 PM By: [name] Previous Signature: 07/14/2016 4:35:54 PM Version By: [name] Entered By: Baltazar Najjar on 07/15/2016 07:42:45 [name], [name] ([id number]) [name], [name] ([id number]) -------------------------------------------------------------------------------- HPI Details [name] Date of Service: 07/14/2016 1:30 PM Medical Record Patient Account Number: [id number] Number: Treating RN: [name] 01-17-1931 (82 y.o. Other Clinician: Date of Birth/Sex: Female) Treating ROBSON, MICHAEL Primary Care Physician/Extender: Letitia Libra, NARGES Physician: Referring Physician: Guy Begin Weeks in Treatment: 4 History of Present Illness HPI Description: 06/10/16; this is an 82 year old woman who lives near [place] with family members. She saw her primary doctor roughly over a week ago and was referred to the ER for a cellulitis in the right medial leg. She was given a round of IV antibiotics in the emergency room and discharged on oral Keflex which she is taking. She is been left with a uncomfortable leg area on the right medial lower leg. Her ABIs in this clinic were 0.8 on the right 0.6 on the left. She does not describe claudication. She is not a diabetic. Lab work in the ER showed a normal BUN and creatinine on 6/20 at 18 and 0.92 respectively white count was 10.1 hemoglobin at 13.8. She did not have any imaging studies. I can see no vascular evaluations in Epic. She does not have a prior wound history 06/17/16; the area affected on her right medial ankle is an area I thought was due to tissue damage from cellulitis last week. She is going for venous reflux studies this afternoon, these were not ordered in  this clinic at least not by me. The patient does not give a prior history of damage to the skin in this area although I wonder if she actually might not of noticed it. We have been using's Aquacel Ag under a wrap although we will  not be able to wrap her today 06/24/16: pt reports today she hasn't heard from the vascular clinic regarding proceeding with arterial studies. our office will assist with this today. she denies s/s of infection. denies problems with her wraps. 07/07/16; since I last saw this woman she was admitted to hospital from 7/16 through 7/18 with sepsis syndrome felt to be secondary to her ulcer. She was treated with Vanco and Zosyn the hospital discharged on Septra. Blood cultures were negative. Urine culture showed multiple species she is now feeling well using Santyl with border foam to her wound. [facility] has [facility] 07/14/16; no major improvement here. She has a quarter-sized wound covered with thick adherent slough on the lateral aspect of her right leg with several surrounding satellite lesions in a similar state. She has had 2 recent bouts of cellulitis, one before she came to this clinic and one required a recent hospitalization. I have asked for arterial studies I don't believe these have ever been done. My notes suggest from early in July that she went for reflux studies although I don't know that I have ever seen these either, she did have a DVT rule out but I did not order this, this did not show a DVT but did show a Baker's cyst Electronic Signature(s) Signed: 07/15/2016 7:57:27 AM By: Baltazar Najjar MD Entered By: Baltazar Najjar on 07/15/2016 07:52:23 [name], [name] ([id number]) -------------------------------------------------------------------------------- Physical Exam Details [name] Date of Service: 07/14/2016 1:30 PM Medical Record Patient Account Number: [id number] Number: Treating RN: [name] July 25, 1931 (82  y.o. Other Clinician: Date of Birth/Sex: Female) Treating ROBSON, MICHAEL Primary Care Physician/Extender: Letitia Libra, NARGES Physician: Referring Physician: Guy Begin Weeks in Treatment: 4 Notes Wound exam; no improvement at all. She has a small wound with a cluster of surrounding small wounds. All of these have tightly adherent surface slough. Difficult debridement. We have been using Santyl in this case her pedal pulses are palpable however ABI in this clinic was 0.81 Electronic Signature(s) Signed: 07/15/2016 7:57:27 AM By: Baltazar Najjar MD Entered By: Baltazar Najjar on 07/15/2016 07:53:19 [name], [name] ([id number]) -------------------------------------------------------------------------------- Physician Orders Details [name] Date of Service: 07/14/2016 1:30 PM Medical Record Patient Account Number: [id number] Number: Treating RN: [name] 02-28-1931 (82 y.o. Other Clinician: Date of Birth/Sex: Female) Treating ROBSON, MICHAEL Primary Care Physician/Extender: Letitia Libra, NARGES Physician: Referring Physician: [name] in Treatment: 4 Verbal / Phone Orders: Yes Clinician: Pinkerton, Debi Read Back and Verified: Yes Diagnosis Coding Wound Cleansing Wound #1 Right,Medial Lower Leg o Cleanse wound with mild soap and water Skin Barriers/Peri-Wound Care Wound #1 Right,Medial Lower Leg o Skin Prep o Moisturizing lotion - to dry skin Primary Wound Dressing Wound #1 Right,Medial Lower Leg o Iodoflex Secondary Dressing Wound #1 Right,Medial Lower Leg o ABD pad o Dry Gauze Dressing Change Frequency Wound #1 Right,Medial Lower Leg o Other: - Pt will get dressing changes on Tuesday on clinic and on Friday by [facility]. Follow-up Appointments Wound #1 Right,Medial Lower Leg o Return Appointment in 1 week. Edema Control Wound #1 Right,Medial Lower Leg o 3 Layer Compression System - Right Lower Extremity  - 3cm from toes to 3cm from knee anchor with unna o Elevate legs to the level of the heart and pump ankles as often as possible Piggott, Naveyah J. ([id number]) Additional Orders / Instructions Wound #1 Right,Medial Lower Leg o Increase protein intake. o Activity as tolerated Home Health Wound #1 Right,Medial  Lower Leg o Continue Home Health Visits - [facility] Please change wrap on Friday. o Home Health Nurse may visit PRN to address patientos wound care needs. o FACE TO FACE ENCOUNTER: MEDICARE and MEDICAID PATIENTS: I certify that this patient is under my care and that I had a face-to-face encounter that meets the physician face-to-face encounter requirements with this patient on this date. The encounter with the patient was in whole or in part for the following MEDICAL CONDITION: (primary reason for Home Healthcare) MEDICAL NECESSITY: I certify, that based on my findings, NURSING services are a medically necessary home health service. HOME BOUND STATUS: I certify that my clinical findings support that this patient is homebound (i.e., Due to illness or injury, pt requires aid of supportive devices such as crutches, cane, wheelchairs, walkers, the use of special transportation or the assistance of another person to leave their place of residence. There is a normal inability to leave the home and doing so requires considerable and taxing effort. Other absences are for medical reasons / religious services and are infrequent or of short duration when for other reasons). o If current dressing causes regression in wound condition, may D/C ordered dressing product/s and apply Normal Saline Moist Dressing daily until next Wound Healing Center / Other MD appointment. Notify Wound Healing Center of regression in wound condition at [phone]. o Please direct any NON-WOUND related issues/requests for orders to patient's Primary Care Physician Services and Therapies o Arterial  Studies- Bilateral - Dr. Jari Sportsman office Electronic Signature(s) Signed: 07/14/2016 4:35:54 PM By: [name] Signed: 07/15/2016 7:57:27 AM By: Baltazar Najjar MD Entered By: [name] on 07/14/2016 14:10:26 [name], [name] ([id number]) -------------------------------------------------------------------------------- Problem List Details [name], [name]. Date of Service: 07/14/2016 1:30 PM Medical Record Patient Account Number: [id number] Number: Treating RN: [name] 04-Jan-1931 (82 y.o. 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 Date of Service: 07/14/2016 1:30 PM Medical Record Patient Account Number: [id number] Number: Treating RN: [name] 1931/07/27 (82 y.o. Other Clinician: Date of Birth/Sex: Female) Treating ROBSON, MICHAEL Primary Care Physician/Extender: Letitia Libra, NARGES Physician: Referring Physician: Guy Begin Weeks in Treatment: 4 Subjective Chief Complaint Information obtained from Patient 06/10/16; the patient is here for review of a wound on the right anterior medial lower leg above the ankle History of Present Illness  (HPI) 06/10/16; this is an 82 year old woman who lives near [place] with family members. She saw her primary doctor roughly over a week ago and was referred to the ER for a cellulitis in the right medial leg. She was given a round of IV antibiotics in the emergency room and discharged on oral Keflex which she is taking. She is been left with a uncomfortable leg area on the right medial lower leg. Her ABIs in this clinic were 0.8 on the right 0.6 on the left. She does not describe claudication. She is not a diabetic. Lab work in the ER showed  a normal BUN and creatinine on 6/20 at 18 and 0.92 respectively white count was 10.1 hemoglobin at 13.8. She did not have any imaging studies. I can see no vascular evaluations in Epic. She does not have a prior wound history 06/17/16; the area affected on her right medial ankle is an area I thought was due to tissue damage from cellulitis last week. She is going for venous reflux studies this afternoon, these were not ordered in this clinic at least not by me. The patient does not give a prior history of damage to the skin in this area although I wonder if she actually might not of noticed it. We have been using's Aquacel Ag under a wrap although we will not be able to wrap her today 06/24/16: pt reports today she hasn't heard from the vascular clinic regarding proceeding with arterial studies. our office will assist with this today. she denies s/s of infection. denies problems with her wraps. 07/07/16; since I last saw this woman she was admitted to hospital from 7/16 through 7/18 with sepsis syndrome felt to be secondary to her ulcer. She was treated with Vanco and Zosyn the hospital discharged on Septra. Blood cultures were negative. Urine culture showed multiple species she is now feeling well using Santyl with border foam to her wound. [facility] has [facility] 07/14/16; no major improvement here. She has a quarter-sized wound covered with thick adherent  slough on the lateral aspect of her right leg with several surrounding satellite lesions in a similar state. She has had 2 recent bouts of cellulitis, one before she came to this clinic and one required a recent hospitalization. I have asked for arterial studies I don't believe these have ever been done. My notes suggest from early in July that she went for reflux studies although I don't know that I have ever seen these either, she did have a DVT rule out but I did not order this, this did not show a DVT but did show a Baker's cyst [name], [name]. ([id number]) Objective Constitutional Vitals Time Taken: 1:43 PM, Height: 61 in, Weight: 170 lbs, BMI: 32.1, Temperature: 98.0 F, Pulse: 70 bpm, Respiratory Rate: 18 breaths/min, Blood Pressure: 138/73 mmHg. Integumentary (Hair, Skin) Wound #1 status is Open. Original cause of wound was Gradually Appeared. The wound is located on the Right,Medial Lower Leg. The wound measures 4.5cm length x 3.2cm width x 0.2cm depth; 11.31cm^2 area and 2.262cm^3 volume. The wound is limited to skin breakdown. There is no tunneling or undermining noted. There is a large amount of serosanguineous drainage noted. The wound margin is indistinct and nonvisible. There is medium (34-66%) pink, pale granulation within the wound bed. There is a medium (34- 66%) amount of necrotic tissue within the wound bed including Adherent Slough. The periwound skin appearance exhibited: Dry/Scaly. The periwound skin appearance did not exhibit: Callus, Crepitus, Excoriation, Fluctuance, Friable, Induration, Localized Edema, Rash, Scarring, Maceration, Moist, Atrophie Blanche, Cyanosis, Ecchymosis, Hemosiderin Staining, Mottled, Pallor, Rubor, Erythema. Periwound temperature was noted as Cool/Cold. The periwound has tenderness on palpation. Assessment Active Problems ICD-10 L97.211 - Non-pressure chronic ulcer of right calf limited to breakdown of skin L03.115 - Cellulitis of right  lower limb I70.232 - Atherosclerosis of native arteries of right leg with ulceration of calf Procedures Wound #1 Wound #1 is a Venous Leg Ulcer located on the Right,Medial Lower Leg . There was a Skin/Subcutaneous Tissue Debridement ([id number]) debridement with total area of 14.4 sq cm performed by ROBSON, Venetia Night,  MD. with the following instrument(s): Curette to remove Viable and Non-Viable tissue/material including Exudate, Fibrin/Slough, and Subcutaneous after achieving pain control using Other (lidocaine 4% cream). A time out was conducted prior to the start of the procedure. A Minimum amount of bleeding was controlled with Pressure. The procedure was tolerated well with a pain level of 0 throughout and a pain level of 0 following the procedure. Post Debridement Measurements: 4.5cm length x 3.2cm width x 0.2cm depth; [name], Jordan J. ([id number]) 2.262cm^3 volume. Post procedure Diagnosis Wound #1: Same as Pre-Procedure Plan Wound Cleansing: Wound #1 Right,Medial Lower Leg: Cleanse wound with mild soap and water Skin Barriers/Peri-Wound Care: Wound #1 Right,Medial Lower Leg: Skin Prep Moisturizing lotion - to dry skin Primary Wound Dressing: Wound #1 Right,Medial Lower Leg: Iodoflex Secondary Dressing: Wound #1 Right,Medial Lower Leg: ABD pad Dry Gauze Dressing Change Frequency: Wound #1 Right,Medial Lower Leg: Other: - Pt will get dressing changes on Tuesday on clinic and on Friday by [facility] Follow-up Appointments: Wound #1 Right,Medial Lower Leg: Return Appointment in 1 week. Edema Control: Wound #1 Right,Medial Lower Leg: 3 Layer Compression System - Right Lower Extremity - 3cm from toes to 3cm from knee anchor with unna Elevate legs to the level of the heart and pump ankles as often as possible Additional Orders / Instructions: Wound #1 Right,Medial Lower Leg: Increase protein intake. Activity as tolerated Home Health: Wound #1 Right,Medial Lower Leg: Continue  Home Health Visits - [facility] Please change wrap on Friday. Home Health Nurse may visit PRN to address patient s wound care needs. FACE TO FACE ENCOUNTER: MEDICARE and MEDICAID PATIENTS: I certify that this patient is under my care and that I had a face-to-face encounter that meets the physician face-to-face encounter requirements with this patient on this date. The encounter with the patient was in whole or in part for the following MEDICAL CONDITION: (primary reason for Home Healthcare) MEDICAL NECESSITY: I certify, that based on my findings, NURSING services are a medically necessary home health service. HOME BOUND STATUS: I certify that my clinical findings support that this patient is homebound (i.e., Due to illness or injury, pt requires aid of supportive devices such as crutches, cane, wheelchairs, walkers, the use of special transportation or the assistance of another person to leave their place of residence. There is a TANNAH, DREYFUSS. ([id number]) normal inability to leave the home and doing so requires considerable and taxing effort. Other absences are for medical reasons / religious services and are infrequent or of short duration when for other reasons). If current dressing causes regression in wound condition, may D/C ordered dressing product/s and apply Normal Saline Moist Dressing daily until next Wound Healing Center / Other MD appointment. Notify Wound Healing Center of regression in wound condition at [phone]. Please direct any NON-WOUND related issues/requests for orders to patient's Primary Care Physician Services and Therapies ordered were: Arterial Studies- Bilateral - Dr. Jari Sportsman office o #1 I really of lost track of the vascular workup this patient was supposed to have. I had initially ordered arterial studies, these don't seem to a been done. I have in my notes that she was going for reflux studies but this may have just been a DVT rule out. I would like to get  arterial studies done #2 her wound is really not making progress with Santyl and changed her to Iodoflex. #3 if she has adequate arterial flow, she may need formal reflux studies on the right leg. #4 she has had 2 recent bouts  of cellulitis diagnosed clinically although I all was wonder about venous inflammation Electronic Signature(s) Signed: 07/15/2016 7:57:27 AM By: Baltazar Najjar MD Entered By: Baltazar Najjar on 07/15/2016 07:56:18 Aurich, [name] ([id number]) -------------------------------------------------------------------------------- SuperBill Details [name] Date of Service: 07/14/2016 Medical Record Patient Account Number: [id number] Number: Treating RN: [name] 1931-10-27 (82 y.o. Other Clinician: Date of Birth/Sex: Female) Treating ROBSON, MICHAEL Primary Care Physician/Extender: Letitia Libra, NARGES Physician: Weeks in Treatment: 4 Referring Physician: Guy Begin Diagnosis Coding ICD-10 Codes Code Description [phone] Non-pressure chronic ulcer of right calf limited to breakdown of skin L03.115 Cellulitis of right lower limb I70.232 Atherosclerosis of native arteries of right leg with ulceration of calf Facility Procedures CPT4 Code Description: [id number] 11042 - DEB SUBQ TISSUE 20 SQ CM/< ICD-10 Description Diagnosis 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 Modifier: reakdown of ski Quantity: 1 n Physician Procedures CPT4 Code Description: [id number] 11042 - WC PHYS SUBQ TISS 20 SQ CM ICD-10 Description Diagnosis 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 Modifier: reakdown of ski Quantity: 1 n Electronic Signature(s) Signed: 07/15/2016 7:57:27 AM By: Baltazar Najjar MD Entered By: Baltazar Najjar on 07/15/2016 07:56:48

## 2016-07-20 ENCOUNTER — Ambulatory Visit: Payer: Medicare Other | Admitting: Cardiovascular Disease

## 2016-07-21 ENCOUNTER — Encounter: Payer: Self-pay | Admitting: Cardiovascular Disease

## 2016-07-21 ENCOUNTER — Ambulatory Visit: Payer: Medicare Other | Admitting: Cardiovascular Disease

## 2016-07-21 ENCOUNTER — Encounter: Payer: Medicare Other | Admitting: Surgery

## 2016-07-21 ENCOUNTER — Ambulatory Visit (INDEPENDENT_AMBULATORY_CARE_PROVIDER_SITE_OTHER): Payer: Medicare Other | Admitting: Cardiovascular Disease

## 2016-07-21 VITALS — BP 160/87 | HR 79 | Ht 65.0 in | Wt 165.5 lb

## 2016-07-21 DIAGNOSIS — I10 Essential (primary) hypertension: Secondary | ICD-10-CM

## 2016-07-21 DIAGNOSIS — I739 Peripheral vascular disease, unspecified: Secondary | ICD-10-CM | POA: Diagnosis not present

## 2016-07-21 DIAGNOSIS — I70232 Atherosclerosis of native arteries of right leg with ulceration of calf: Secondary | ICD-10-CM | POA: Diagnosis not present

## 2016-07-21 NOTE — Progress Notes (Signed)
Cardiology Office Note   Date:  07/21/2016   ID:  Jordan BandaCecil J Grzesiak, DOB December 16, 1930, MRN [id number]  PCP:  [name], NARGES, MD  Cardiologist:   Lorine BearsMuhammad Danali Marinos, MD   Chief Complaint  Patient presents with  . Other    Wound care. Meds reviewed verbally with pt.      History of Present Illness: [name] is a 82 y.o. female who Was referred by Dr. Leanord Hawkingobson for evaluation of possible peripheral arterial disease. The patient has no history of diabetes or tobacco use. She is known to have hypertension. She reports having an ulceration on the right medial above the ankle few months ago. She tried to medicate herself with a powder and later that area got infected. She was treated with antibiotics and was actually hospitalized in July for this. She received IV antibiotics. She was noted to have mild sepsis related to that which responded to treatment. She was discharged home on Augmentin. She denies any claudication. She is currently attending at the wound clinic with some improvement in the wound. She was actually there just today.    Past Medical History:  Diagnosis Date  . Dizziness   . Hypertension     Past Surgical History:  Procedure Laterality Date  . HERNIA REPAIR    . JOINT REPLACEMENT     left knee     Current Outpatient Prescriptions  Medication Sig Dispense Refill  . amLODipine (NORVASC) 10 MG tablet Take 10 mg by mouth daily.   5  . aspirin EC 81 MG tablet Take 81 mg by mouth daily.    Marland Kitchen. BIOTIN PO Take 1 tablet by mouth daily.    . cholecalciferol (VITAMIN D) 1000 units tablet Take 1,000 Units by mouth daily.    Marland Kitchen. labetalol (NORMODYNE) 100 MG tablet Take 100 mg by mouth 2 (two) times daily.   5  . potassium chloride (K-DUR) 10 MEQ tablet Take 10 mEq by mouth daily.   1  . triamterene-hydrochlorothiazide (MAXZIDE-25) 37.5-25 MG per tablet Take 1 tablet by mouth daily.  5   No current facility-administered medications for this visit.     Allergies:   Sulfa  antibiotics    Social History:  The patient  reports that she has never smoked. She has never used smokeless tobacco. She reports that she does not drink alcohol or use drugs.   Family History:  The patient's Family history is negative for coronary artery disease.   ROS:  Please see the history of present illness.   Otherwise, review of systems are positive for none.   All other systems are reviewed and negative.    PHYSICAL EXAM: VS:  BP (!) 160/87 (BP Location: Left Arm, Patient Position: Sitting, Cuff Size: Large)   Pulse 79   Ht 5\' 5"  (1.651 m)   Wt 165 lb 8 oz (75.1 kg)   BMI 27.54 kg/m  , BMI Body mass index is 27.54 kg/m. GEN: Well nourished, well developed, in no acute distress  HEENT: normal  Neck: no JVD, carotid bruits, or masses Cardiac: RRR; no murmurs, rubs, or gallops,no edema  Respiratory:  clear to auscultation bilaterally, normal work of breathing GI: soft, nontender, nondistended, + BS MS: no deformity or atrophy  Skin: warm and dry, no rash Neuro:  Strength and sensation are intact Psych: euthymic mood, full affect The right leg is wrapped and she just came from the wound clinic and thus I did not unwrap the leg. The wound is  located on the right medial lower leg above the ankle  EKG:  EKG is not ordered today.    Recent Labs: 06/28/2016: ALT 17 06/30/2016: BUN 12; Creatinine, Ser 0.85; Hemoglobin 12.6; Magnesium 1.8; Platelets 87; Potassium 3.2; Sodium 139    Lipid Panel No results found for: CHOL, TRIG, HDL, CHOLHDL, VLDL, LDLCALC, LDLDIRECT    Wt Readings from Last 3 Encounters:  07/21/16 165 lb 8 oz (75.1 kg)  06/28/16 159 lb 9.6 oz (72.4 kg)  06/02/16 180 lb (81.6 kg)         ASSESSMENT AND PLAN:  1.  Venous leg ulcer: The patient underwent vascular arterial studies in our office which showed an ABI of 0.94 on the right and 0.69 on the left with normal right toe brachial index. Duplex showed diffuse atherosclerosis without discrete  stenosis. Three-vessel runoff was noted on the right below the knee. Based on this and the location and can rest and restriction of the ulcer, it is most likely venous. I recommend continuing wound care and leg elevation. The patient can follow-up with me if needed based on her progress at the wound clinic.  2. Essential hypertension: Blood pressure is mildly elevated today. She is to follow-up with her primary care physician about this.   Disposition:   FU with me as needed.   Signed,  Lorine Bears, MD  07/21/2016 5:41 PM    [facility]

## 2016-07-21 NOTE — Patient Instructions (Signed)
Medication Instructions: No changes.   Labwork: None.   Procedures/Testing: None.   Follow-Up: As needed with Dr. Salayah Meares.   Any Additional Special Instructions Will Be Listed Below (If Applicable).     If you need a refill on your cardiac medications before your next appointment, please call your pharmacy.   

## 2016-07-21 NOTE — Progress Notes (Signed)
[name], [name] ([id number]) Visit Report for 07/21/2016 Arrival Information Details [name], [name]. Date of Service: 07/21/2016 1:30 PM Medical Record Number: [id number] Patient Account Number: [id number] Date of Birth/Sex: 1931/03/16 (82 y.o. Female) Treating RN: Ashok Cordia, Debi Primary Care Physician: Guy Begin Other Clinician: Referring Physician: Guy Begin Treating Physician/Extender: Rudene Re in Treatment: 5 Visit Information History Since Last Visit All ordered tests and consults were completed: No Patient Arrived: [name] Added or deleted any medications: No Arrival Time: 13:40 Any new allergies or adverse reactions: No Accompanied By: daughter Had a fall or experienced change in No Transfer Assistance: None activities of daily living that may affect Patient Identification Verified: Yes risk of falls: Secondary Verification Process Yes Signs or symptoms of abuse/neglect since last No Completed: visito Patient Requires Transmission-Based No Hospitalized since last visit: No Precautions: Pain Present Now: No Patient Has Alerts: No Electronic Signature(s) Signed: 07/21/2016 4:37:03 PM By: [name] Entered By: [name] on 07/21/2016 13:43:19 [name] ([id number]) -------------------------------------------------------------------------------- Encounter Discharge Information Details [name] Date of Service: 07/21/2016 1:30 PM Medical Record Number: [id number] Patient Account Number: [id number] Date of Birth/Sex: 21-Jun-1931 (82 y.o. Female) Treating RN: Ashok Cordia, Debi Primary Care Physician: Guy Begin Other Clinician: Referring Physician: Guy Begin Treating Physician/Extender: Rudene Re in Treatment: 5 Encounter Discharge Information Items Discharge Pain Level: 0 Discharge Condition: Stable Ambulatory Status: Cane Discharge Destination: Home Transportation: Private  Auto Accompanied By: daughter Schedule Follow-up Appointment: Yes Medication Reconciliation completed Yes and provided to Patient/Care [name]: Provided on Clinical Summary of Care: 07/21/2016 Form Type Recipient Paper Patient CF Electronic Signature(s) Signed: 07/21/2016 2:23:33 PM By: Gwenlyn Perking Entered By: Gwenlyn Perking on 07/21/2016 14:23:33 [name] ([id number]) -------------------------------------------------------------------------------- Lower Extremity Assessment Details [name] Date of Service: 07/21/2016 1:30 PM Medical Record Number: [id number] Patient Account Number: [id number] Date of Birth/Sex: 02/06/1931 (82 y.o. Female) Treating RN: Ashok Cordia, Debi Primary Care Physician: Guy Begin Other Clinician: Referring Physician: Guy Begin Treating Physician/Extender: Rudene Re in Treatment: 5 Edema Assessment Assessed: [Left: No] [Right: No] E[Left: dema] [Right: :] Calf Left: Right: Point of Measurement: 32 cm From Medial Instep cm 34.2 cm Ankle Left: Right: Point of Measurement: 10 cm From Medial Instep cm 22.6 cm Vascular Assessment Pulses: Posterior Tibial Dorsalis Pedis Palpable: [Right:Yes] Extremity colors, hair growth, and conditions: Extremity Color: [Right:Normal] Temperature of Extremity: [Right:Warm] Capillary Refill: [Right:< 3 seconds] Toe Nail Assessment Left: Right: Thick: No Discolored: No Deformed: No Improper Length and Hygiene: No Electronic Signature(s) Signed: 07/21/2016 4:37:03 PM By: [name] Entered By: [name] on 07/21/2016 13:55:37 [name], [name] ([id number]) -------------------------------------------------------------------------------- Multi Wound Chart Details [name] Date of Service: 07/21/2016 1:30 PM Medical Record Number: [id number] Patient Account Number: [id number] Date of Birth/Sex: 04-20-1931 (82 y.o. Female) Treating RN:  Ashok Cordia, Debi Primary Care Physician: Guy Begin Other Clinician: Referring Physician: Guy Begin Treating Physician/Extender: Rudene Re in Treatment: 5 Vital Signs Height(in): 61 Pulse(bpm): 81 Weight(lbs): 170 Blood Pressure 143/98 (mmHg): Body Mass Index(BMI): 32 Temperature(F): 97.9 Respiratory Rate 18 (breaths/min): Photos: [1:No Photos] [N/A:N/A] Wound Location: [1:Right Lower Leg - Medial] [N/A:N/A] Wounding Event: [1:Gradually Appeared] [N/A:N/A] Primary Etiology: [1:Venous Leg Ulcer] [N/A:N/A] Comorbid History: [1:Cataracts, Arrhythmia, Hypertension, Osteoarthritis] [N/A:N/A] Date Acquired: [1:06/01/2016] [N/A:N/A] Weeks of Treatment: [1:5] [N/A:N/A] Wound Status: [1:Open] [N/A:N/A] Measurements L x W x D 2.8x3x0.2 [N/A:N/A] (cm) Area (cm) : [1:6.597] [N/A:N/A] Volume (cm) : [1:1.319] [N/A:N/A] % Reduction in Area: [1:79.00%] [N/A:N/A] % Reduction  in Volume: 58.00% [N/A:N/A] Classification: [1:Partial Thickness] [N/A:N/A] Exudate Amount: [1:Large] [N/A:N/A] Exudate Type: [1:Serosanguineous] [N/A:N/A] Exudate Color: [1:red, brown] [N/A:N/A] Wound Margin: [1:Indistinct, nonvisible] [N/A:N/A] Granulation Amount: [1:Large (67-100%)] [N/A:N/A] Granulation Quality: [1:Red, Pink] [N/A:N/A] Necrotic Amount: [1:Small (1-33%)] [N/A:N/A] Exposed Structures: [1:Fascia: No Fat: No Tendon: No Muscle: No Joint: No Bone: No] [N/A:N/A] Limited to Skin Breakdown Epithelialization: None N/A N/A Periwound Skin Texture: Edema: No N/A N/A Excoriation: No Induration: No Callus: No Crepitus: No Fluctuance: No Friable: No Rash: No Scarring: No Periwound Skin Dry/Scaly: Yes N/A N/A Moisture: Maceration: No Moist: No Periwound Skin Color: Atrophie Blanche: No N/A N/A Cyanosis: No Ecchymosis: No Erythema: No Hemosiderin Staining: No Mottled: No Pallor: No Rubor: No Temperature: Cool/Cold N/A N/A Tenderness on Yes N/A N/A Palpation: Wound  Preparation: Ulcer Cleansing: N/A N/A Rinsed/Irrigated with Saline Topical Anesthetic Applied: Other: Lidocaine 4% Treatment Notes Electronic Signature(s) Signed: 07/21/2016 4:37:03 PM By: [name] Entered By: [name] on 07/21/2016 13:56:43 [name], [name] ([id number]) -------------------------------------------------------------------------------- Multi-Disciplinary Care Plan Details [name], GIZA. Date of Service: 07/21/2016 1:30 PM Medical Record Number: [id number] Patient Account Number: [id number] Date of Birth/Sex: February 04, 1931 (82 y.o. Female) Treating RN: Ashok Cordia, Debi Primary Care Physician: Guy Begin Other Clinician: Referring Physician: Guy Begin Treating Physician/Extender: Rudene Re in Treatment: 5 Active Inactive Orientation to the Wound Care Program Nursing Diagnoses: Knowledge deficit related to the wound healing center program Goals: Patient/caregiver will verbalize understanding of the Wound Healing Center Program Date Initiated: 06/10/2016 Goal Status: Active Interventions: Provide education on orientation to the wound center Notes: Venous Leg Ulcer Nursing Diagnoses: Potential for venous Insuffiency (use before diagnosis confirmed) Goals: Non-invasive venous studies are completed as ordered Date Initiated: 06/10/2016 Goal Status: Active Patient will maintain optimal edema control Date Initiated: 06/10/2016 Goal Status: Active Patient/caregiver will verbalize understanding of disease process and disease management Date Initiated: 06/10/2016 Goal Status: Active Verify adequate tissue perfusion prior to therapeutic compression application Date Initiated: 06/10/2016 Goal Status: Active Interventions: Assess peripheral edema status every visit. Compression as ordered CELSA, NORDAHL ([id number]) Provide education on venous insufficiency Treatment Activities: Non-invasive vascular studies :  06/10/2016 Notes: Wound/Skin Impairment Nursing Diagnoses: Impaired tissue integrity Knowledge deficit related to ulceration/compromised skin integrity Goals: Patient/caregiver will verbalize understanding of skin care regimen Date Initiated: 06/10/2016 Goal Status: Active Ulcer/skin breakdown will have a volume reduction of 30% by week 4 Date Initiated: 06/10/2016 Goal Status: Active Ulcer/skin breakdown will have a volume reduction of 50% by week 8 Date Initiated: 06/10/2016 Goal Status: Active Ulcer/skin breakdown will have a volume reduction of 80% by week 12 Date Initiated: 06/10/2016 Goal Status: Active Ulcer/skin breakdown will heal within 14 weeks Date Initiated: 06/10/2016 Goal Status: Active Interventions: Assess patient/caregiver ability to obtain necessary supplies Assess patient/caregiver ability to perform ulcer/skin care regimen upon admission and as needed Assess ulceration(s) every visit Provide education on ulcer and skin care Treatment Activities: Skin care regimen initiated : 06/10/2016 Topical wound management initiated : 06/10/2016 Notes: Electronic Signature(s) Signed: 07/21/2016 4:37:03 PM By: [name] Entered By: [name] on 07/21/2016 13:56:37 Mctigue, CARIZMA DUNSWORTH ([id number]) [name], [name] ([id number]) -------------------------------------------------------------------------------- Pain Assessment Details [name] Date of Service: 07/21/2016 1:30 PM Medical Record Number: [id number] Patient Account Number: [id number] Date of Birth/Sex: 1931/11/02 (82 y.o. Female) Treating RN: [name] Primary Care Physician: Guy Begin Other Clinician: Referring Physician: Guy Begin Treating Physician/Extender: Rudene Re in Treatment: 5 Active Problems Location of Pain Severity and Description of Pain Patient Has Paino  No Site Locations With Dressing Change: No Pain Management and Medication Current Pain  Management: Electronic Signature(s) Signed: 07/21/2016 4:37:03 PM By: Alejandro MullingPinkerton, Debra Entered By: Alejandro MullingPinkerton, Debra on 07/21/2016 13:43:31 Claassen, [name] ([id number]) -------------------------------------------------------------------------------- Patient/Caregiver Education Details Patient Name: Jordan BandaFULLER, Fartun J. Date of Service: 07/21/2016 1:30 PM Medical Record Number: [id number] Patient Account Number: [id number] Date of Birth/Gender: 09/26/1931 (82 y.o. Female) Treating RN: Ashok CordiaPinkerton, Debi Primary Care Physician: [name], NARGES Other Clinician: Referring Physician: [name], NARGES Treating Physician/Extender: Rudene ReBritto, Errol Weeks in Treatment: 5 Education Assessment Education Provided To: Patient Education Topics Provided Wound/Skin Impairment: Handouts: Other: do not get dressing wet Methods: Demonstration, Explain/Verbal Responses: State content correctly Electronic Signature(s) Signed: 07/21/2016 4:37:03 PM By: Alejandro MullingPinkerton, Debra Entered By: Alejandro MullingPinkerton, Debra on 07/21/2016 13:58:59 [name], Jordan Jordan CommonsJ. ([id number]) -------------------------------------------------------------------------------- Wound Assessment Details Patient Name: Jordan BandaFULLER, Jordan J. Date of Service: 07/21/2016 1:30 PM Medical Record Number: [id number] Patient Account Number: [id number] Date of Birth/Sex: 09/26/1931 (82 y.o. Female) Treating RN: Ashok CordiaPinkerton, Debi Primary Care Physician: [name], NARGES Other Clinician: Referring Physician: [name], NARGES Treating Physician/Extender: Rudene ReBritto, Errol Weeks in Treatment: 5 Wound Status Wound Number: 1 Primary Venous Leg Ulcer Etiology: Wound Location: Right Lower Leg - Medial Wound Status: Open Wounding Event: Gradually Appeared Comorbid Cataracts, Arrhythmia, Hypertension, Date Acquired: 06/01/2016 History: Osteoarthritis Weeks Of Treatment: 5 Clustered Wound: No Photos Photo Uploaded By: Alejandro MullingPinkerton, Debra on 07/21/2016 16:34:35 Wound Measurements Length: (cm)  2.8 Width: (cm) 3 Depth: (cm) 0.2 Area: (cm) 6.597 Volume: (cm) 1.319 % Reduction in Area: 79% % Reduction in Volume: 58% Epithelialization: None Tunneling: No Undermining: No Wound Description Classification: Partial Thickness Wound Margin: Indistinct, nonvisible Exudate Amount: Large Exudate Type: Serosanguineous Exudate Color: red, brown Foul Odor After Cleansing: No Wound Bed Granulation Amount: Large (67-100%) Exposed Structure Granulation Quality: Red, Pink Fascia Exposed: No Necrotic Amount: Small (1-33%) Fat Layer Exposed: No Necrotic Quality: Adherent Slough Tendon Exposed: No Goto, Jordan J. ([id number]) Muscle Exposed: No Joint Exposed: No Bone Exposed: No Limited to Skin Breakdown Periwound Skin Texture Texture Color No Abnormalities Noted: No No Abnormalities Noted: No Callus: No Atrophie Blanche: No Crepitus: No Cyanosis: No Excoriation: No Ecchymosis: No Fluctuance: No Erythema: No Friable: No Hemosiderin Staining: No Induration: No Mottled: No Localized Edema: No Pallor: No Rash: No Rubor: No Scarring: No Temperature / Pain Moisture Temperature: Cool/Cold No Abnormalities Noted: No Tenderness on Palpation: Yes Dry / Scaly: Yes Maceration: No Moist: No Wound Preparation Ulcer Cleansing: Rinsed/Irrigated with Saline Topical Anesthetic Applied: Other: Lidocaine 4%, Treatment Notes Wound #1 (Right, Medial Lower Leg) 1. Cleansed with: Cleanse wound with antibacterial soap and water 4. Dressing Applied: Iodoflex 5. Secondary Dressing Applied ABD Pad Dry Gauze 7. Secured with Tape 3 Layer Compression System - Right Lower Extremity Notes unna to anchor Electronic Signature(s) Signed: 07/21/2016 4:37:03 PM By: Alejandro MullingPinkerton, Debra Entered By: Alejandro MullingPinkerton, Debra on 07/21/2016 13:53:48 Cappello, Jordan RhymeCECIL J. ([id number]) Lollar, Beau Jordan CommonsJ. ([id number]) -------------------------------------------------------------------------------- Vitals  Details Patient Name: Jordan BandaFULLER, Alfredo J. Date of Service: 07/21/2016 1:30 PM Medical Record Number: [id number] Patient Account Number: [id number] Date of Birth/Sex: 09/26/1931 (82 y.o. Female) Treating RN: Ashok CordiaPinkerton, Debi Primary Care Physician: [name], NARGES Other Clinician: Referring Physician: [name], NARGES Treating Physician/Extender: Rudene ReBritto, Errol Weeks in Treatment: 5 Vital Signs Time Taken: 13:43 Temperature (F): 97.9 Height (in): 61 Pulse (bpm): 81 Weight (lbs): 170 Respiratory Rate (breaths/min): 18 Body Mass Index (BMI): 32.1 Blood Pressure (mmHg): 143/98 Reference Range: 80 - 120 mg / dl Electronic Signature(s) Signed: 07/21/2016 4:37:03 PM By: Alejandro MullingPinkerton, Debra Entered By: Alejandro MullingPinkerton, Debra on  07/21/2016 13:43:52 

## 2016-07-21 NOTE — Progress Notes (Signed)
CRISTA, NUON ([id number]) Visit Report for 07/21/2016 HPI Details [name], [name]. Date of Service: 07/21/2016 1:30 PM Medical Record Number: [id number] Patient Account Number: [id number] Date of Birth/Sex: 15-Sep-1931 (82 y.o. Female) Treating RN: [name] Primary Care Physician: Guy Begin Other Clinician: Referring Physician: Guy Begin Treating Physician/Extender: Rudene Re in Treatment: 5 History of Present Illness HPI Description: 06/10/16; this is an 82 year old woman who lives near [place] with family members. She saw her primary doctor roughly over a week ago and was referred to the ER for a cellulitis in the right medial leg. She was given a round of IV antibiotics in the emergency room and discharged on oral Keflex which she is taking. She is been left with a uncomfortable leg area on the right medial lower leg. Her ABIs in this clinic were 0.8 on the right 0.6 on the left. She does not describe claudication. She is not a diabetic. Lab work in the ER showed a normal BUN and creatinine on 6/20 at 18 and 0.92 respectively white count was 10.1 hemoglobin at 13.8. She did not have any imaging studies. I can see no vascular evaluations in Epic. She does not have a prior wound history 06/17/16; the area affected on her right medial ankle is an area I thought was due to tissue damage from cellulitis last week. She is going for venous reflux studies this afternoon, these were not ordered in this clinic at least not by me. The patient does not give a prior history of damage to the skin in this area although I wonder if she actually might not of noticed it. We have been using's Aquacel Ag under a wrap although we will not be able to wrap her today 06/24/16: pt reports today she hasn't heard from the vascular clinic regarding proceeding with arterial studies. our office will assist with this today. she denies s/s of infection. denies problems with her  wraps. 07/07/16; since I last saw this woman she was admitted to hospital from 7/16 through 7/18 with sepsis syndrome felt to be secondary to her ulcer. She was treated with Vanco and Zosyn the hospital discharged on Septra. Blood cultures were negative. Urine culture showed multiple species she is now feeling well using Santyl with border foam to her wound. [facility] has [facility] 07/14/16; no major improvement here. She has a quarter-sized wound covered with thick adherent slough on the lateral aspect of her right leg with several surrounding satellite lesions in a similar state. She has had 2 recent bouts of cellulitis, one before she came to this clinic and one required a recent hospitalization. I have asked for arterial studies I don't believe these have ever been done. My notes suggest from early in July that she went for reflux studies although I don't know that I have ever seen these either, she did have a DVT rule out but I did not order this, this did not show a DVT but did show a Baker's cyst 07/21/2016 -- the patient had an arterial duplex study done with waveform suggestive of right femoropopliteal disease. There is also small vessel disease bilaterally. Right ABI was in the normal range of 1.0 and a TBI was 0.74. Left ABI is abnormal at 0.69 with a TBI of 0.40. Three-vessel runoff on the right and a two-vessel runoff on the left. She has an appointment to see Dr. Kirke Corin later today [name], [name] ([id number]) Electronic Signature(s) Signed: 07/21/2016 2:14:29 PM By: Evlyn Kanner MD,  FACS Previous Signature: 07/21/2016 2:11:46 PM Version By: Evlyn Kanner MD, FACS Entered By: Evlyn Kanner on 07/21/2016 14:14:29 [name] ([id number]) -------------------------------------------------------------------------------- Physical Exam Details [name], [name]. Date of Service: 07/21/2016 1:30 PM Medical Record Number: [id number] Patient Account Number: [id number] Date of  Birth/Sex: 19-Feb-1931 (82 y.o. Female) Treating RN: Ashok Cordia, Debi Primary Care Physician: Guy Begin Other Clinician: Referring Physician: Guy Begin Treating Physician/Extender: Rudene Re in Treatment: 5 Constitutional . Pulse regular. Respirations normal and unlabored. Afebrile. . Eyes Nonicteric. Reactive to light. Ears, Nose, Mouth, and Throat Lips, teeth, and gums WNL.Marland Kitchen Moist mucosa without lesions. Neck supple and nontender. No palpable supraclavicular or cervical adenopathy. Normal sized without goiter. Respiratory WNL. No retractions.. Breath sounds WNL, No rubs, rales, rhonchi, or wheeze.. Cardiovascular Heart rhythm and rate regular, no murmur or gallop.. Pedal Pulses WNL. No clubbing, cyanosis or edema. Chest Breasts symmetical and no nipple discharge.. Breast tissue WNL, no masses, lumps, or tenderness.. Lymphatic No adneopathy. No adenopathy. No adenopathy. Musculoskeletal Adexa without tenderness or enlargement.. Digits and nails w/o clubbing, cyanosis, infection, petechiae, ischemia, or inflammatory conditions.. Integumentary (Hair, Skin) No suspicious lesions. No crepitus or fluctuance. No peri-wound warmth or erythema. No masses.Marland Kitchen Psychiatric Judgement and insight Intact.. No evidence of depression, anxiety, or agitation.. Notes the wound does show that there is some subcutaneous debris but I have been able to wash it out nicely with moist saline gauze and no sharp debridement was required today. Electronic Signature(s) Signed: 07/21/2016 2:15:01 PM By: Evlyn Kanner MD, FACS Entered By: Evlyn Kanner on 07/21/2016 14:14:59 [name] ([id number]) -------------------------------------------------------------------------------- Physician Orders Details [name], [name] Date of Service: 07/21/2016 1:30 PM Medical Record Number: [id number] Patient Account Number: [id number] Date of Birth/Sex: 07-14-1931 (82 y.o. Female) Treating  RN: Ashok Cordia, Debi Primary Care Physician: Guy Begin Other Clinician: Referring Physician: Guy Begin Treating Physician/Extender: Rudene Re in Treatment: 5 Verbal / Phone Orders: Yes Clinician: Ashok Cordia, Debi Read Back and Verified: Yes Diagnosis Coding Wound Cleansing Wound #1 Right,Medial Lower Leg o Cleanse wound with mild soap and water Skin Barriers/Peri-Wound Care Wound #1 Right,Medial Lower Leg o Skin Prep o Moisturizing lotion - to dry skin Primary Wound Dressing Wound #1 Right,Medial Lower Leg o Iodoflex Secondary Dressing Wound #1 Right,Medial Lower Leg o ABD pad o Dry Gauze Dressing Change Frequency Wound #1 Right,Medial Lower Leg o Other: - Pt will get dressing changes on Tuesday on clinic and on Friday by [facility]. Follow-up Appointments Wound #1 Right,Medial Lower Leg o Return Appointment in 1 week. Edema Control Wound #1 Right,Medial Lower Leg o 3 Layer Compression System - Right Lower Extremity - 3cm from toes to 3cm from knee anchor with unna o Elevate legs to the level of the heart and pump ankles as often as possible Additional Orders / Instructions Wound #1 Right,Medial Lower Leg o Increase protein intake. [name], [name] ([id number]) o Activity as tolerated Home Health Wound #1 Right,Medial Lower Leg o Continue Home Health Visits - [facility] Please change wrap on Friday. o Home Health Nurse may visit PRN to address patientos wound care needs. o FACE TO FACE ENCOUNTER: MEDICARE and MEDICAID PATIENTS: I certify that this patient is under my care and that I had a face-to-face encounter that meets the physician face-to-face encounter requirements with this patient on this date. The encounter with the patient was in whole or in part for the following MEDICAL CONDITION: (primary reason for Home Healthcare) MEDICAL NECESSITY: I certify, that based on my  findings, NURSING services are a medically necessary  home health service. HOME BOUND STATUS: I certify that my clinical findings support that this patient is homebound (i.e., Due to illness or injury, pt requires aid of supportive devices such as crutches, cane, wheelchairs, walkers, the use of special transportation or the assistance of another person to leave their place of residence. There is a normal inability to leave the home and doing so requires considerable and taxing effort. Other absences are for medical reasons / religious services and are infrequent or of short duration when for other reasons). o If current dressing causes regression in wound condition, may D/C ordered dressing product/s and apply Normal Saline Moist Dressing daily until next Wound Healing Center / Other MD appointment. Notify Wound Healing Center of regression in wound condition at [phone]. o Please direct any NON-WOUND related issues/requests for orders to patient's Primary Care Physician Electronic Signature(s) Signed: 07/21/2016 4:16:34 PM By: Evlyn Kanner MD, FACS Signed: 07/21/2016 4:37:03 PM By: [name] Entered By: [name] on 07/21/2016 14:05:50 Shvartsman, [name] ([id number]) -------------------------------------------------------------------------------- Progress Note Details [name], [name]. Date of Service: 07/21/2016 1:30 PM Medical Record Number: [id number] Patient Account Number: [id number] Date of Birth/Sex: [birth year] (82 y.o. Female) Treating RN: [name] Primary Care Physician: Guy Begin Other Clinician: Referring Physician: Guy Begin Treating Physician/Extender: Rudene Re in Treatment: 5 Subjective History of Present Illness (HPI) 06/10/16; this is an 82 year old woman who lives near [place] with family members. She saw her primary doctor roughly over a week ago and was referred to the ER for a cellulitis in the right medial leg. She was given a round of IV antibiotics in the  emergency room and discharged on oral Keflex which she is taking. She is been left with a uncomfortable leg area on the right medial lower leg. Her ABIs in this clinic were 0.8 on the right 0.6 on the left. She does not describe claudication. She is not a diabetic. Lab work in the ER showed a normal BUN and creatinine on 6/20 at 18 and 0.92 respectively white count was 10.1 hemoglobin at 13.8. She did not have any imaging studies. I can see no vascular evaluations in Epic. She does not have a prior wound history 06/17/16; the area affected on her right medial ankle is an area I thought was due to tissue damage from cellulitis last week. She is going for venous reflux studies this afternoon, these were not ordered in this clinic at least not by me. The patient does not give a prior history of damage to the skin in this area although I wonder if she actually might not of noticed it. We have been using's Aquacel Ag under a wrap although we will not be able to wrap her today 06/24/16: pt reports today she hasn't heard from the vascular clinic regarding proceeding with arterial studies. our office will assist with this today. she denies s/s of infection. denies problems with her wraps. 07/07/16; since I last saw this woman she was admitted to hospital from 7/16 through 7/18 with sepsis syndrome felt to be secondary to her ulcer. She was treated with Vanco and Zosyn the hospital discharged on Septra. Blood cultures were negative. Urine culture showed multiple species she is now feeling well using Santyl with border foam to her wound. [facility] has [facility] 07/14/16; no major improvement here. She has a quarter-sized wound covered with thick adherent slough on the lateral aspect of her right leg with several  surrounding satellite lesions in a similar state. She has had 2 recent bouts of cellulitis, one before she came to this clinic and one required a recent hospitalization. I have asked for arterial  studies I don't believe these have ever been done. My notes suggest from early in July that she went for reflux studies although I don't know that I have ever seen these either, she did have a DVT rule out but I did not order this, this did not show a DVT but did show a Baker's cyst 07/21/2016 -- the patient had an arterial duplex study done with waveform suggestive of right femoropopliteal disease. There is also small vessel disease bilaterally. Right ABI was in the normal range of 1.0 and a TBI was 0.74. Left ABI is abnormal at 0.69 with a TBI of 0.40. Three-vessel runoff on the right and a two-vessel runoff on the left. She has an appointment to see Dr. Kirke CorinArida later today [name], Jordan J. ([id number]) Objective Constitutional Pulse regular. Respirations normal and unlabored. Afebrile. Vitals Time Taken: 1:43 PM, Height: 61 in, Weight: 170 lbs, BMI: 32.1, Temperature: 97.9 F, Pulse: 81 bpm, Respiratory Rate: 18 breaths/min, Blood Pressure: 143/98 mmHg. Eyes Nonicteric. Reactive to light. Ears, Nose, Mouth, and Throat Lips, teeth, and gums WNL.Marland Kitchen. Moist mucosa without lesions. Neck supple and nontender. No palpable supraclavicular or cervical adenopathy. Normal sized without goiter. Respiratory WNL. No retractions.. Breath sounds WNL, No rubs, rales, rhonchi, or wheeze.. Cardiovascular Heart rhythm and rate regular, no murmur or gallop.. Pedal Pulses WNL. No clubbing, cyanosis or edema. Chest Breasts symmetical and no nipple discharge.. Breast tissue WNL, no masses, lumps, or tenderness.. Lymphatic No adneopathy. No adenopathy. No adenopathy. Musculoskeletal Adexa without tenderness or enlargement.. Digits and nails w/o clubbing, cyanosis, infection, petechiae, ischemia, or inflammatory conditions.Marland Kitchen. Psychiatric Judgement and insight Intact.. No evidence of depression, anxiety, or agitation.. General Notes: the wound does show that there is some subcutaneous debris but I have been able  to wash it out nicely with moist saline gauze and no sharp debridement was required today. Integumentary (Hair, Skin) No suspicious lesions. No crepitus or fluctuance. No peri-wound warmth or erythema. No masses.. Wound #1 status is Open. Original cause of wound was Gradually Appeared. The wound is located on the Right,Medial Lower Leg. The wound measures 2.8cm length x 3cm width x 0.2cm depth; 6.597cm^2 area and 1.319cm^3 volume. The wound is limited to skin breakdown. There is no tunneling or undermining [name], Jordan J. ([id number]) noted. There is a large amount of serosanguineous drainage noted. The wound margin is indistinct and nonvisible. There is large (67-100%) red, pink granulation within the wound bed. There is a small (1-33%) amount of necrotic tissue within the wound bed including Adherent Slough. The periwound skin appearance exhibited: Dry/Scaly. The periwound skin appearance did not exhibit: Callus, Crepitus, Excoriation, Fluctuance, Friable, Induration, Localized Edema, Rash, Scarring, Maceration, Moist, Atrophie Blanche, Cyanosis, Ecchymosis, Hemosiderin Staining, Mottled, Pallor, Rubor, Erythema. Periwound temperature was noted as Cool/Cold. The periwound has tenderness on palpation. Assessment I have reviewed her arterial studies and she is to see Dr. Kirke CorinArida later this afternoon. 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 These results were discussed with the patient and her daughter who is at the bedside.We will await vascular opinion. In the meanwhile I have recommended we continue with Iodoflex and a 3 layer Profore wrap to be changed twice a week. Plan Wound Cleansing: Wound #1 Right,Medial Lower Leg: Cleanse wound with mild soap  and water Skin Barriers/Peri-Wound Care: Wound #1 Right,Medial Lower Leg: Skin Prep Moisturizing lotion - to dry skin Primary Wound Dressing: Wound #1 Right,Medial Lower Leg: Iodoflex Secondary  Dressing: Wound #1 Right,Medial Lower Leg: ABD pad Dry Gauze Dressing Change Frequency: Wound #1 Right,Medial Lower Leg: Other: - Pt will get dressing changes on Tuesday on clinic and on Friday by [facility]. Follow-up Appointments: Wound #1 Right,Medial Lower Leg: Return Appointment in 1 week. Edema Control: Wound #1 Right,Medial Lower Leg: 3 Layer Compression System - Right Lower Extremity - 3cm from toes to 3cm from knee anchor with unna Elevate legs to the level of the heart and pump ankles as often as possible [name], Jordan J. ([id number]) Additional Orders / Instructions: Wound #1 Right,Medial Lower Leg: Increase protein intake. Activity as tolerated Home Health: Wound #1 Right,Medial Lower Leg: Continue Home Health Visits - [facility] Please change wrap on Friday. Home Health Nurse may visit PRN to address patient s wound care needs. FACE TO FACE ENCOUNTER: MEDICARE and MEDICAID PATIENTS: I certify that this patient is under my care and that I had a face-to-face encounter that meets the physician face-to-face encounter requirements with this patient on this date. The encounter with the patient was in whole or in part for the following MEDICAL CONDITION: (primary reason for Home Healthcare) MEDICAL NECESSITY: I certify, that based on my findings, NURSING services are a medically necessary home health service. HOME BOUND STATUS: I certify that my clinical findings support that this patient is homebound (i.e., Due to illness or injury, pt requires aid of supportive devices such as crutches, cane, wheelchairs, walkers, the use of special transportation or the assistance of another person to leave their place of residence. There is a normal inability to leave the home and doing so requires considerable and taxing effort. Other absences are for medical reasons / religious services and are infrequent or of short duration when for other reasons). If current dressing causes regression in wound  condition, may D/C ordered dressing product/s and apply Normal Saline Moist Dressing daily until next Wound Healing Center / Other MD appointment. Notify Wound Healing Center of regression in wound condition at [phone]. Please direct any NON-WOUND related issues/requests for orders to patient's Primary Care Physician I have reviewed her arterial studies and she is to see Dr. Kirke Corin later this afternoon. 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 These results were discussed with the patient and her daughter who is at the bedside.We will await vascular opinion. In the meanwhile I have recommended we continue with Iodoflex and a 3 layer Profore wrap to be changed twice a week. Electronic Signature(s) Signed: 07/21/2016 2:16:58 PM By: Evlyn Kanner MD, FACS Entered By: Evlyn Kanner on 07/21/2016 14:16:57 [name], [name] ([id number]) -------------------------------------------------------------------------------- SuperBill Details [name] Date of Service: 07/21/2016 Medical Record Number: [id number] Patient Account Number: [id number] Date of Birth/Sex: [birth year] (82 y.o. 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

## 2016-07-22 ENCOUNTER — Encounter: Payer: Self-pay | Admitting: *Deleted

## 2016-07-28 ENCOUNTER — Encounter: Payer: Medicare Other | Admitting: Surgery

## 2016-07-28 DIAGNOSIS — I70232 Atherosclerosis of native arteries of right leg with ulceration of calf: Secondary | ICD-10-CM | POA: Diagnosis not present

## 2016-07-28 NOTE — Progress Notes (Addendum)
IVY, PURYEAR ([id number]) Visit Report for 07/28/2016 Chief Complaint Document Details [name], [name]. Date of Service: 07/28/2016 9:30 AM Medical Record Number: [id number] Patient Account Number: [id number] Date of Birth/Sex: 01/31/1931 (82 y.o. Female) Treating RN: Huel Coventry Primary Care Physician: Guy Begin Other Clinician: Referring Physician: Guy Begin Treating Physician/Extender: Rudene Re in Treatment: 6 Information Obtained from: Patient Chief Complaint 06/10/16; the patient is here for review of a wound on the right anterior medial lower leg above the ankle Electronic Signature(s) Signed: 07/28/2016 10:16:19 AM By: Evlyn Kanner MD, FACS Entered By: Evlyn Kanner on 07/28/2016 10:16:18 [name] ([id number]) -------------------------------------------------------------------------------- Debridement Details [name] Date of Service: 07/28/2016 9:30 AM Medical Record Number: [id number] Patient Account Number: [id number] Date of Birth/Sex: May 01, 1931 (82 y.o. Female) Treating RN: Huel Coventry Primary Care Physician: Guy Begin Other Clinician: Referring Physician: Guy Begin Treating Physician/Extender: Rudene Re in Treatment: 6 Debridement Performed for Wound #1 Right,Medial Lower Leg Assessment: Performed By: Physician Evlyn Kanner, MD Debridement: Debridement Pre-procedure Yes Verification/Time Out Taken: Start Time: 09:45 Pain Control: Other : lidociane 4% Level: Skin/Subcutaneous Tissue Total Area Debrided (L x 2 (cm) x 1.7 (cm) = 3.4 (cm) W): Tissue and other Viable, Non-Viable, Exudate, Fibrin/Slough, Skin, Subcutaneous material debrided: Instrument: Curette Bleeding: Moderate Hemostasis Achieved: Pressure End Time: 09:48 Procedural Pain: 3 Post Procedural Pain: 2 Response to Treatment: Procedure was tolerated well Post Debridement Measurements of Total Wound Length: (cm)  2 Width: (cm) 1.7 Depth: (cm) 0.2 Volume: (cm) 0.534 Post Procedure Diagnosis Same as Pre-procedure Electronic Signature(s) Signed: 07/28/2016 10:16:12 AM By: Evlyn Kanner MD, FACS Signed: 07/30/2016 5:06:33 PM By: Elliot Gurney RN, BSN, Kim RN, BSN Entered By: Evlyn Kanner on 07/28/2016 10:16:12 Main, [name] Rhyme ([id number]) -------------------------------------------------------------------------------- HPI Details [name] Date of Service: 07/28/2016 9:30 AM Medical Record Number: [id number] Patient Account Number: [id number] Date of Birth/Sex: 10/31/1931 (82 y.o. Female) Treating RN: Huel Coventry Primary Care Physician: Guy Begin Other Clinician: Referring Physician: Guy Begin Treating Physician/Extender: Rudene Re in Treatment: 6 History of Present Illness HPI Description: 06/10/16; this is an 82 year old woman who lives near [place] with family members. She saw her primary doctor roughly over a week ago and was referred to the ER for a cellulitis in the right medial leg. She was given a round of IV antibiotics in the emergency room and discharged on oral Keflex which she is taking. She is been left with a uncomfortable leg area on the right medial lower leg. Her ABIs in this clinic were 0.8 on the right 0.6 on the left. She does not describe claudication. She is not a diabetic. Lab work in the ER showed a normal BUN and creatinine on 6/20 at 18 and 0.92 respectively white count was 10.1 hemoglobin at 13.8. She did not have any imaging studies. I can see no vascular evaluations in Epic. She does not have a prior wound history 06/17/16; the area affected on her right medial ankle is an area I thought was due to tissue damage from cellulitis last week. She is going for venous reflux studies this afternoon, these were not ordered in this clinic at least not by me. The patient does not give a prior history of damage to the skin in this area although  I wonder if she actually might not of noticed it. We have been using's Aquacel Ag under a wrap although we will not be able to wrap her today 06/24/16: pt reports today  she hasn't heard from the vascular clinic regarding proceeding with arterial studies. our office will assist with this today. she denies s/s of infection. denies problems with her wraps. 07/07/16; since I last saw this woman she was admitted to hospital from 7/16 through 7/18 with sepsis syndrome felt to be secondary to her ulcer. She was treated with Vanco and Zosyn the hospital discharged on Septra. Blood cultures were negative. Urine culture showed multiple species she is now feeling well using Santyl with border foam to her wound. [facility] 07/14/16; no major improvement here. She has a quarter-sized wound covered with thick adherent slough on the lateral aspect of her right leg with several surrounding satellite lesions in a similar state. She has had 2 recent bouts of cellulitis, one before she came to this clinic and one required a recent hospitalization. I have asked for arterial studies I don't believe these have ever been done. My notes suggest from early in July that she went for reflux studies although I don't know that I have ever seen these either, she did have a DVT rule out but I did not order this, this did not show a DVT but did show a Baker's cyst 07/21/2016 -- the patient had an arterial duplex study done with waveform suggestive of right femoropopliteal disease. There is also small vessel disease bilaterally. Right ABI was in the normal range of 1.0 and a TBI was 0.74. Left ABI is abnormal at 0.69 with a TBI of 0.40. Three-vessel runoff on the right and a two-vessel runoff on the left. She has an appointment to see Dr. Kirke Corin later today. 07/28/2016 -- was seen by Dr. Kirke Corin who reviewed her arterial studies with an ABI of 0.94 on the right and 0.69 on the left with normal right toe brachial index.  Duplex showed diffuse atherosclerosis without discrete stenosis and there was a three-vessel runoff on the right below the knee. Based on these studies and the location of the ulcer he thought it was venous commended she continue with wound care consults. [name], [name] ([id number]) Electronic Signature(s) Signed: 07/28/2016 10:16:31 AM By: Evlyn Kanner MD, FACS Previous Signature: 07/28/2016 9:30:29 AM Version By: Evlyn Kanner MD, FACS Entered By: Evlyn Kanner on 07/28/2016 10:16:31 [name], [name] ([id number]) -------------------------------------------------------------------------------- Physical Exam Details [name], [name]. Date of Service: 07/28/2016 9:30 AM Medical Record Number: [id number] Patient Account Number: [id number] Date of Birth/Sex: 08-25-1931 (82 y.o. Female) Treating RN: Huel Coventry Primary Care Physician: Guy Begin Other Clinician: Referring Physician: Guy Begin Treating Physician/Extender: Rudene Re in Treatment: 6 Constitutional . Pulse regular. Respirations normal and unlabored. Afebrile. . Eyes Nonicteric. Reactive to light. Ears, Nose, Mouth, and Throat Lips, teeth, and gums WNL.Marland Kitchen Moist mucosa without lesions. Neck supple and nontender. No palpable supraclavicular or cervical adenopathy. Normal sized without goiter. Respiratory WNL. No retractions.. Cardiovascular Pedal Pulses WNL. No clubbing, cyanosis or edema. Lymphatic No adneopathy. No adenopathy. No adenopathy. Musculoskeletal Adexa without tenderness or enlargement.. Digits and nails w/o clubbing, cyanosis, infection, petechiae, ischemia, or inflammatory conditions.. Integumentary (Hair, Skin) No suspicious lesions. No crepitus or fluctuance. No peri-wound warmth or erythema. No masses.Marland Kitchen Psychiatric Judgement and insight Intact.. No evidence of depression, anxiety, or agitation.. Notes the wound continues to have subcutaneous slough and I have sharply  debrided this with a #3 curet and bleeding controlled with pressure. Electronic Signature(s) Signed: 07/28/2016 10:17:07 AM By: Evlyn Kanner MD, FACS Entered By: Evlyn Kanner on 07/28/2016 10:17:07 [name] ([id number]) --------------------------------------------------------------------------------  Physician Orders Details [name], [name]. Date of Service: 07/28/2016 9:30 AM Medical Record Number: [id number] Patient Account Number: [id number] Date of Birth/Sex: [birth year] (82 y.o. Female) Treating RN: Huel Coventry Primary Care Physician: Guy Begin Other Clinician: Referring Physician: Guy Begin Treating Physician/Extender: Rudene Re in Treatment: 6 Verbal / Phone Orders: Yes Clinician: Huel Coventry Read Back and Verified: Yes Diagnosis Coding Wound Cleansing Wound #1 Right,Medial Lower Leg o Cleanse wound with mild soap and water Skin Barriers/Peri-Wound Care Wound #1 Right,Medial Lower Leg o Skin Prep o Moisturizing lotion - to dry skin Primary Wound Dressing Wound #1 Right,Medial Lower Leg o Iodoflex Secondary Dressing Wound #1 Right,Medial Lower Leg o ABD pad o Dry Gauze Dressing Change Frequency Wound #1 Right,Medial Lower Leg o Other: - Pt will get dressing changes on Tuesday on clinic and on Friday by [facility]. Follow-up Appointments Wound #1 Right,Medial Lower Leg o Return Appointment in 1 week. Edema Control Wound #1 Right,Medial Lower Leg o 3 Layer Compression System - Right Lower Extremity - 3cm from toes to 3cm from knee anchor with unna o Elevate legs to the level of the heart and pump ankles as often as possible Additional Orders / Instructions Wound #1 Right,Medial Lower Leg o Increase protein intake. [name], [name] ([id number]) o Activity as tolerated Home Health Wound #1 Right,Medial Lower Leg o Continue Home Health Visits - [facility] Please change wrap on Friday. o Home Health Nurse  may visit PRN to address patientos wound care needs. o FACE TO FACE ENCOUNTER: MEDICARE and MEDICAID PATIENTS: I certify that this patient is under my care and that I had a face-to-face encounter that meets the physician face-to-face encounter requirements with this patient on this date. The encounter with the patient was in whole or in part for the following MEDICAL CONDITION: (primary reason for Home Healthcare) MEDICAL NECESSITY: I certify, that based on my findings, NURSING services are a medically necessary home health service. HOME BOUND STATUS: I certify that my clinical findings support that this patient is homebound (i.e., Due to illness or injury, pt requires aid of supportive devices such as crutches, cane, wheelchairs, walkers, the use of special transportation or the assistance of another person to leave their place of residence. There is a normal inability to leave the home and doing so requires considerable and taxing effort. Other absences are for medical reasons / religious services and are infrequent or of short duration when for other reasons). o If current dressing causes regression in wound condition, may D/C ordered dressing product/s and apply Normal Saline Moist Dressing daily until next Wound Healing Center / Other MD appointment. Notify Wound Healing Center of regression in wound condition at [phone]. o Please direct any NON-WOUND related issues/requests for orders to patient's Primary Care Physician Electronic Signature(s) Signed: 07/28/2016 12:12:38 PM By: Evlyn Kanner MD, FACS Signed: 07/30/2016 5:06:33 PM By: Elliot Gurney RN, BSN, Kim RN, BSN Entered By: Elliot Gurney, RN, BSN, Kim on 07/28/2016 09:49:56 [name], [name] Rhyme ([id number]) -------------------------------------------------------------------------------- Problem List Details [name], [name]. Date of Service: 07/28/2016 9:30 AM Medical Record Number: [id number] Patient Account Number:  [id number] Date of Birth/Sex: 1931-08-09 (82 y.o. 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 ([id number]) -------------------------------------------------------------------------------- Progress Note Details [name], Jordan J. Date of Service: 07/28/2016 9:30 AM Medical Record Number: [id number] Patient Account Number: [id number] Date of Birth/Sex: [birth year] (82 y.o. Female) Treating RN: Huel CoventryWoody, Kim Primary Care Physician: [name], NARGES Other Clinician: Referring Physician: [name], NARGES Treating Physician/Extender: Rudene ReBritto, Michell Giuliano Weeks in Treatment: 6 Subjective Chief Complaint Information obtained from Patient 06/10/16; the patient is here for review of a wound on the right anterior medial lower leg above the ankle History of Present Illness (HPI) 06/10/16; this is an 82 year old woman who [place] near [place] family members. She saw her primary doctor roughly over a week ago and was referred to the ER for a cellulitis in the right medial leg. She was given a round of IV antibiotics in the emergency room and discharged on oral Keflex which she is taking. She is been left with a uncomfortable leg area on the right medial lower leg. Her ABIs in this clinic were 0.8 on the right 0.6 on the left. She does not describe claudication. She is  not a diabetic. Lab work in the ER showed a normal BUN and creatinine on 6/20 at 18 and 0.92 respectively white count was 10.1 hemoglobin at 13.8. She did not have any imaging studies. I can see no vascular evaluations in Epic. She does not have a prior wound history 06/17/16; the area affected on her right medial ankle is an area I thought was due to tissue damage from cellulitis last week. She is going for venous reflux studies this afternoon, these were not ordered in this clinic at least not by me. The patient does not give a prior history of damage to the skin in this area although I wonder if she actually might not of noticed it. We have been using's Aquacel Ag under a wrap although we will not be able to wrap her today 06/24/16: pt reports today she hasn't heard from the vascular clinic regarding proceeding with arterial studies. our office will assist with this today. she denies s/s of infection. denies problems with her wraps. 07/07/16; since I last saw this woman she was admitted to hospital from 7/16 through 7/18 with sepsis syndrome felt to be secondary to her ulcer. She was treated with Vanco and Zosyn the hospital discharged on Septra. Blood cultures were negative. Urine culture showed multiple species she is now feeling well using Santyl with border foam to her wound. [facility] has [facility] 07/14/16; no major improvement here. She has a quarter-sized wound covered with thick adherent slough on the lateral aspect of her right leg with several surrounding satellite lesions in a similar state. She has had 2 recent bouts of cellulitis, one before she came to this clinic and one required a recent hospitalization. I have asked for arterial studies I don't believe these have ever been done. My notes suggest from early in July that she went for reflux studies although I don't know that I have ever seen these either, she did have a DVT rule out but I did not order this, this did not show a  DVT but did show a Baker's cyst 07/21/2016 -- the patient had an arterial duplex study done with waveform suggestive of right femoropopliteal disease. There is also small vessel disease bilaterally. Right ABI was  in the normal range of 1.0 and a TBI was 0.74. Left ABI is abnormal at 0.69 with a TBI of 0.40. Three-vessel runoff on the right and a two-vessel runoff on the left. [name], Pierina J. ([id number]) She has an appointment to see Dr. Kirke CorinArida later today. 07/28/2016 -- was seen by Dr. Kirke CorinArida who reviewed her arterial studies with an ABI of 0.94 on the right and 0.69 on the left with normal right toe brachial index. Duplex showed diffuse atherosclerosis without discrete stenosis and there was a three-vessel runoff on the right below the knee. Based on these studies and the location of the ulcer he thought it was venous commended she continue with wound care consults. Objective Constitutional Pulse regular. Respirations normal and unlabored. Afebrile. Vitals Time Taken: 9:31 AM, Height: 61 in, Weight: 170 lbs, BMI: 32.1, Temperature: 98.0 F, Pulse: 70 bpm, Respiratory Rate: 18 breaths/min, Blood Pressure: 174/72 mmHg. General Notes: Patient states she takes medication for HTN. Patient asymptomatic. She will follow up with PCP if it continue to be elevated. Eyes Nonicteric. Reactive to light. Ears, Nose, Mouth, and Throat Lips, teeth, and gums WNL.Marland Kitchen. Moist mucosa without lesions. Neck supple and nontender. No palpable supraclavicular or cervical adenopathy. Normal sized without goiter. Respiratory WNL. No retractions.. Cardiovascular Pedal Pulses WNL. No clubbing, cyanosis or edema. Lymphatic No adneopathy. No adenopathy. No adenopathy. Musculoskeletal Adexa without tenderness or enlargement.. Digits and nails w/o clubbing, cyanosis, infection, petechiae, ischemia, or inflammatory conditions.Marland Kitchen. Psychiatric Judgement and insight Intact.. No evidence of depression, anxiety, or  agitation.. General Notes: the wound continues to have subcutaneous slough and I have sharply debrided this with a Mrozek, Corrin J. ([id number]) #3 curet and bleeding controlled with pressure. Integumentary (Hair, Skin) No suspicious lesions. No crepitus or fluctuance. No peri-wound warmth or erythema. No masses.. Wound #1 status is Open. Original cause of wound was Gradually Appeared. The wound is located on the Right,Medial Lower Leg. The wound measures 2cm length x 1.7cm width x 0.2cm depth; 2.67cm^2 area and 0.534cm^3 volume. The wound is limited to skin breakdown. There is no tunneling or undermining noted. There is a large amount of serosanguineous drainage noted. The wound margin is indistinct and nonvisible. There is small (1-33%) red, pink granulation within the wound bed. There is a large (67-100%) amount of necrotic tissue within the wound bed including Adherent Slough. The periwound skin appearance exhibited: Scarring, Dry/Scaly, Hemosiderin Staining. The periwound skin appearance did not exhibit: Callus, Crepitus, Excoriation, Fluctuance, Friable, Induration, Localized Edema, Rash, Maceration, Moist, Atrophie Blanche, Cyanosis, Ecchymosis, Mottled, Pallor, Rubor, Erythema. Periwound temperature was noted as Cool/Cold. The periwound has tenderness on palpation. Assessment Active Problems ICD-10 L97.211 - Non-pressure chronic ulcer of right calf limited to breakdown of skin L03.115 - Cellulitis of right lower limb I70.232 - Atherosclerosis of native arteries of right leg with ulceration of calf Procedures Wound #1 Wound #1 is a Venous Leg Ulcer located on the Right,Medial Lower Leg . There was a Skin/Subcutaneous Tissue Debridement ([id number]) debridement with total area of 3.4 sq cm performed by Evlyn KannerBritto, Donyelle Enyeart, MD. with the following instrument(s): Curette to remove Viable and Non-Viable tissue/material including Exudate, Fibrin/Slough, Skin, and Subcutaneous after achieving  pain control using Other (lidociane 4%). A time out was conducted prior to the start of the procedure. A Moderate amount of bleeding was controlled with Pressure. The procedure was tolerated well with a pain level of 3 throughout and a pain level of 2 following the procedure. Post Debridement Measurements: 2cm length x 1.7cm width x  0.2cm depth; 0.534cm^3 volume. Post procedure Diagnosis Wound #1: Same as Pre-Procedure [name], [name]. ([id number]) Plan Wound Cleansing: Wound #1 Right,Medial Lower Leg: Cleanse wound with mild soap and water Skin Barriers/Peri-Wound Care: Wound #1 Right,Medial Lower Leg: Skin Prep Moisturizing lotion - to dry skin Primary Wound Dressing: Wound #1 Right,Medial Lower Leg: Iodoflex Secondary Dressing: Wound #1 Right,Medial Lower Leg: ABD pad Dry Gauze Dressing Change Frequency: Wound #1 Right,Medial Lower Leg: Other: - Pt will get dressing changes on Tuesday on clinic and on Friday by [facility]. Follow-up Appointments: Wound #1 Right,Medial Lower Leg: Return Appointment in 1 week. Edema Control: Wound #1 Right,Medial Lower Leg: 3 Layer Compression System - Right Lower Extremity - 3cm from toes to 3cm from knee anchor with unna Elevate legs to the level of the heart and pump ankles as often as possible Additional Orders / Instructions: Wound #1 Right,Medial Lower Leg: Increase protein intake. Activity as tolerated Home Health: Wound #1 Right,Medial Lower Leg: Continue Home Health Visits - [facility] Please change wrap on Friday. Home Health Nurse may visit PRN to address patient s wound care needs. FACE TO FACE ENCOUNTER: MEDICARE and MEDICAID PATIENTS: I certify that this patient is under my care and that I had a face-to-face encounter that meets the physician face-to-face encounter requirements with this patient on this date. The encounter with the patient was in whole or in part for the following MEDICAL CONDITION: (primary reason for Home  Healthcare) MEDICAL NECESSITY: I certify, that based on my findings, NURSING services are a medically necessary home health service. HOME BOUND STATUS: I certify that my clinical findings support that this patient is homebound (i.e., Due to illness or injury, pt requires aid of supportive devices such as crutches, cane, wheelchairs, walkers, the use of special transportation or the assistance of another person to leave their place of residence. There is a normal inability to leave the home and doing so requires considerable and taxing effort. Other absences are for medical reasons / religious services and are infrequent or of short duration when for other reasons). If current dressing causes regression in wound condition, may D/C ordered dressing product/s and apply Normal Saline Moist Dressing daily until next Wound Healing Center / Other MD appointment. Notify Wound Healing Center of regression in wound condition at [phone]. Please direct any NON-WOUND related issues/requests for orders to patient's Primary Care Physician OCTAVIA, VELADOR ([id number]) I have reviewed her arterial studies and Dr. Jari Sportsman consulting note and have discussed this with her. 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 These results were discussed with the patient specially the vascular opinion. we will continue to treat her for venous disease In the meanwhile I have recommended we continue with Iodoflex and a 3 layer Profore wrap to be changed twice a week. Electronic Signature(s) Signed: 07/28/2016 10:18:50 AM By: Evlyn Kanner MD, FACS Entered By: Evlyn Kanner on 07/28/2016 10:18:50 [name] ([id number]) -------------------------------------------------------------------------------- SuperBill Details [name] Date of Service: 07/28/2016 Medical Record Number: [id number] Patient Account Number: [id number] Date of Birth/Sex: [birth year] (82  y.o. Female) Treating RN: Huel Coventry Primary Care Physician: Guy Begin Other Clinician: Referring Physician: Guy Begin Treating Physician/Extender: Rudene Re in Treatment: 6 Diagnosis Coding ICD-10 Codes Code Description [phone] Non-pressure chronic ulcer of right calf limited to breakdown of skin L03.115 Cellulitis of right lower limb I70.232 Atherosclerosis of native arteries of right leg with ulceration of calf Facility Procedures CPT4 Code Description: [id number] -  DEB SUBQ TISSUE 20 SQ CM/< ICD-10 Description Diagnosis 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 L03.115 Cellulitis of right lower limb I70.232 Atherosclerosis of native arteries of right leg  with Modifier: reakdown of s ulceration of Quantity: 1 kin calf Physician Procedures CPT4 Code Description: 1610960 99213 - WC PHYS LEVEL 3 - EST PT ICD-10 Description Diagnosis 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 L03.115 Cellulitis of right lower limb I70.232 Atherosclerosis of native arteries of right leg with Modifier: 25 reakdown of sk ulceration of Quantity: 1 in calf CPT4 Code Description: 4540981 11042 - WC PHYS SUBQ TISS 20 SQ CM ICD-10 Description Diagnosis 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

## 2016-07-31 NOTE — Progress Notes (Signed)
[name], Mayley J. ([id number]) Visit Report for 07/28/2016 Arrival Information Details [name], Jordan J. Date of Service: 07/28/2016 9:30 AM Medical Record Number: [id number] Patient Account Number: [id number] Date of Birth/Sex: [birth year] (82 y.o. Female) Treating RN: Huel CoventryWoody, Kim Primary Care Physician: [name], NARGES Other Clinician: Referring Physician: [name], NARGES Treating Physician/Extender: Rudene ReBritto, Errol Weeks in Treatment: 6 Visit Information History Since Last Visit Added or deleted any medications: No Patient Arrived: Gilmer MorCane Any new allergies or adverse reactions: No Arrival Time: 09:31 Had a fall or experienced change in No Accompanied By: self activities of daily living that may affect Transfer Assistance: None risk of falls: Patient Identification Verified: Yes Signs or symptoms of abuse/neglect since last No Secondary Verification Process Completed: Yes visito Patient Requires Transmission-Based No Hospitalized since last visit: No Precautions: Has Compression in Place as Prescribed: Yes Patient Has Alerts: No Pain Present Now: No Electronic Signature(s) Signed: 07/30/2016 5:06:33 PM By: Elliot GurneyWoody, RN, BSN, Kim RN, BSN Entered By: Elliot GurneyWoody, RN, BSN, Kim on 07/28/2016 09:31:35 Dreier, [name] ([id number]) -------------------------------------------------------------------------------- Encounter Discharge Information Details [name], Jordan J. Date of Service: 07/28/2016 9:30 AM Medical Record Number: [id number] Patient Account Number: [id number] Date of Birth/Sex: [birth year] (82 y.o. Female) Treating RN: Huel CoventryWoody, Kim Primary Care Physician: [name], NARGES Other Clinician: Referring Physician: [name], NARGES Treating Physician/Extender: Rudene ReBritto, Errol Weeks in Treatment: 6 Encounter Discharge Information Items Discharge Pain Level: 0 Discharge Condition: Stable Ambulatory Status: Ambulatory Discharge Destination: Home Transportation: Private  Auto Accompanied By: self Schedule Follow-up Appointment: Yes Medication Reconciliation completed and provided to Patient/Care Yes Renee Beale: Provided on Clinical Summary of Care: 07/28/2016 Form Type Recipient Paper Patient CF Electronic Signature(s) Signed: 07/28/2016 10:02:10 AM By: Gwenlyn PerkingMoore, Shelia Entered By: Gwenlyn PerkingMoore, Shelia on 07/28/2016 10:02:09 [name], Rabiah J. ([id number]) -------------------------------------------------------------------------------- Lower Extremity Assessment Details [name], Jordan J. Date of Service: 07/28/2016 9:30 AM Medical Record Number: [id number] Patient Account Number: [id number] Date of Birth/Sex: [birth year] (82 y.o. Female) Treating RN: Huel CoventryWoody, Kim Primary Care Physician: [name], NARGES Other Clinician: Referring Physician: [name], NARGES Treating Physician/Extender: Rudene ReBritto, Errol Weeks in Treatment: 6 Edema Assessment Assessed: [Left: No] [Right: No] E[Left: dema] [Right: :] Calf Left: Right: Point of Measurement: 32 cm From Medial Instep cm 35 cm Ankle Left: Right: Point of Measurement: 10 cm From Medial Instep cm 23.5 cm Vascular Assessment Pulses: Posterior Tibial Dorsalis Pedis Palpable: [Right:Yes] Extremity colors, hair growth, and conditions: Extremity Color: [Right:Hyperpigmented] Hair Growth on Extremity: [Right:No] Temperature of Extremity: [Right:Warm] Capillary Refill: [Right:< 3 seconds] Toe Nail Assessment Left: Right: Thick: No Discolored: No Deformed: No Improper Length and Hygiene: No Electronic Signature(s) Signed: 07/30/2016 5:06:33 PM By: Elliot GurneyWoody, RN, BSN, Kim RN, BSN Entered By: Elliot GurneyWoody, RN, BSN, Kim on 07/28/2016 09:36:04 Kempe, [name] ([id number]) -------------------------------------------------------------------------------- Multi Wound Chart Details [name], Jordan J. Date of Service: 07/28/2016 9:30 AM Medical Record Number: [id number] Patient Account Number: [id number] Date of  Birth/Sex: [birth year] (82 y.o. Female) Treating RN: Huel CoventryWoody, Kim Primary Care Physician: [name], NARGES Other Clinician: Referring Physician: [name], NARGES Treating Physician/Extender: Rudene ReBritto, Errol Weeks in Treatment: 6 Vital Signs Height(in): 61 Pulse(bpm): 70 Weight(lbs): 170 Blood Pressure 174/72 (mmHg): Body Mass Index(BMI): 32 Temperature(F): 98.0 Respiratory Rate 18 (breaths/min): Photos: [N/A:N/A] Wound Location: Right Lower Leg - Medial N/A N/A Wounding Event: Gradually Appeared N/A N/A Primary Etiology: Venous Leg Ulcer N/A N/A Comorbid History: Cataracts, Arrhythmia, N/A N/A Hypertension, Osteoarthritis Date Acquired: 06/01/2016 N/A N/A Weeks of Treatment: 6 N/A N/A Wound Status: Open N/A N/A Measurements L x W x D  2x1.7x0.2 N/A N/A (cm) Area (cm) : 2.67 N/A N/A Volume (cm) : 0.534 N/A N/A % Reduction in Area: 91.50% N/A N/A % Reduction in Volume: 83.00% N/A N/A Classification: Partial Thickness N/A N/A Exudate Amount: Large N/A N/A Exudate Type: Serosanguineous N/A N/A Exudate Color: red, brown N/A N/A Wound Margin: Indistinct, nonvisible N/A N/A Granulation Amount: Small (1-33%) N/A N/A Granulation Quality: Red, Pink N/A N/A Necrotic Amount: Large (67-100%) N/A N/A Perillo, Lariza J. ([id number]) Exposed Structures: Fascia: No N/A N/A Fat: No Tendon: No Muscle: No Joint: No Bone: No Limited to Skin Breakdown Epithelialization: Small (1-33%) N/A N/A Periwound Skin Texture: Scarring: Yes N/A N/A Edema: No Excoriation: No Induration: No Callus: No Crepitus: No Fluctuance: No Friable: No Rash: No Periwound Skin Dry/Scaly: Yes N/A N/A Moisture: Maceration: No Moist: No Periwound Skin Color: Hemosiderin Staining: Yes N/A N/A Atrophie Blanche: No Cyanosis: No Ecchymosis: No Erythema: No Mottled: No Pallor: No Rubor: No Temperature: Cool/Cold N/A N/A Tenderness on Yes N/A N/A Palpation: Wound Preparation: Ulcer Cleansing: N/A  N/A Rinsed/Irrigated with Saline Topical Anesthetic Applied: Other: Lidocaine 4% Treatment Notes Electronic Signature(s) Signed: 07/30/2016 5:06:33 PM By: Elliot GurneyWoody, RN, BSN, Kim RN, BSN Entered By: Elliot GurneyWoody, RN, BSN, Kim on 07/28/2016 09:42:59 Waymire, [name] ([id number]) -------------------------------------------------------------------------------- Multi-Disciplinary Care Plan Details [name], Wrenly J. Date of Service: 07/28/2016 9:30 AM Medical Record Number: [id number] Patient Account Number: [id number] Date of Birth/Sex: 1931/04/25 (82 y.o. Female) Treating RN: Huel CoventryWoody, Kim Primary Care Physician: [name], NARGES Other Clinician: Referring Physician: [name], NARGES Treating Physician/Extender: Rudene ReBritto, Errol Weeks in Treatment: 6 Active Inactive Orientation to the Wound Care Program Nursing Diagnoses: Knowledge deficit related to the wound healing center program Goals: Patient/caregiver will verbalize understanding of the Wound Healing Center Program Date Initiated: 06/10/2016 Goal Status: Active Interventions: Provide education on orientation to the wound center Notes: Venous Leg Ulcer Nursing Diagnoses: Potential for venous Insuffiency (use before diagnosis confirmed) Goals: Non-invasive venous studies are completed as ordered Date Initiated: 06/10/2016 Goal Status: Active Patient will maintain optimal edema control Date Initiated: 06/10/2016 Goal Status: Active Patient/caregiver will verbalize understanding of disease process and disease management Date Initiated: 06/10/2016 Goal Status: Active Verify adequate tissue perfusion prior to therapeutic compression application Date Initiated: 06/10/2016 Goal Status: Active Interventions: Assess peripheral edema status every visit. Compression as ordered [name], Farrah J. ([id number]) Provide education on venous insufficiency Treatment Activities: Non-invasive vascular studies : 06/10/2016 Notes: Wound/Skin  Impairment Nursing Diagnoses: Impaired tissue integrity Knowledge deficit related to ulceration/compromised skin integrity Goals: Patient/caregiver will verbalize understanding of skin care regimen Date Initiated: 06/10/2016 Goal Status: Active Ulcer/skin breakdown will have a volume reduction of 30% by week 4 Date Initiated: 06/10/2016 Goal Status: Active Ulcer/skin breakdown will have a volume reduction of 50% by week 8 Date Initiated: 06/10/2016 Goal Status: Active Ulcer/skin breakdown will have a volume reduction of 80% by week 12 Date Initiated: 06/10/2016 Goal Status: Active Ulcer/skin breakdown will heal within 14 weeks Date Initiated: 06/10/2016 Goal Status: Active Interventions: Assess patient/caregiver ability to obtain necessary supplies Assess patient/caregiver ability to perform ulcer/skin care regimen upon admission and as needed Assess ulceration(s) every visit Provide education on ulcer and skin care Treatment Activities: Skin care regimen initiated : 06/10/2016 Topical wound management initiated : 06/10/2016 Notes: Electronic Signature(s) Signed: 07/30/2016 5:06:33 PM By: Elliot GurneyWoody, RN, BSN, Kim RN, BSN Entered By: Elliot GurneyWoody, RN, BSN, Kim on 07/28/2016 09:42:44 Daquila, [name] ([id number]) Tormey, Ludia Shela CommonsJ. ([id number]) -------------------------------------------------------------------------------- Pain Assessment Details [name], Nnenna J. Date of Service: 07/28/2016 9:30  AM Medical Record Number: [id number] Patient Account Number: [id number] Date of Birth/Sex: 02-28-1931 (82 y.o. Female) Treating RN: Huel Coventry Primary Care Physician: Guy Begin Other Clinician: Referring Physician: Guy Begin Treating Physician/Extender: Rudene Re in Treatment: 6 Active Problems Location of Pain Severity and Description of Pain Patient Has Paino No Site Locations Pain Management and Medication Current Pain Management: Electronic Signature(s) Signed:  07/30/2016 5:06:33 PM By: Elliot Gurney, RN, BSN, Kim RN, BSN Entered By: Elliot Gurney, RN, BSN, Kim on 07/28/2016 09:31:40 Clodfelter, [name] ([id number]) -------------------------------------------------------------------------------- Patient/Caregiver Education Details [name] Date of Service: 07/28/2016 9:30 AM Medical Record Number: [id number] Patient Account Number: [id number] Date of Birth/Gender: 15-Oct-1931 (82 y.o. Female) Treating RN: Huel Coventry Primary Care Physician: Guy Begin Other Clinician: Referring Physician: Guy Begin Treating Physician/Extender: Rudene Re in Treatment: 6 Education Assessment Education Provided To: Patient Education Topics Provided Venous: Handouts: Controlling Swelling with Multilayered Compression Wraps Methods: Demonstration, Explain/Verbal, Printed Responses: State content correctly Electronic Signature(s) Signed: 07/30/2016 5:06:33 PM By: Elliot Gurney, RN, BSN, Kim RN, BSN Entered By: Elliot Gurney, RN, BSN, Kim on 07/28/2016 09:51:14 Mortensen, [name] ([id number]) -------------------------------------------------------------------------------- Wound Assessment Details Patient Name: REGINALD, MANGELS. Date of Service: 07/28/2016 9:30 AM Medical Record Number: [id number] Patient Account Number: [id number] Date of Birth/Sex: 02-06-1931 (82 y.o. Female) Treating RN: Huel Coventry Primary Care Physician: Guy Begin Other Clinician: Referring Physician: Guy Begin Treating Physician/Extender: Rudene Re in Treatment: 6 Wound Status Wound Number: 1 Primary Venous Leg Ulcer Etiology: Wound Location: Right Lower Leg - Medial Wound Status: Open Wounding Event: Gradually Appeared Comorbid Cataracts, Arrhythmia, Hypertension, Date Acquired: 06/01/2016 History: Osteoarthritis Weeks Of Treatment: 6 Clustered Wound: No Photos Wound Measurements Length: (cm) 2 Width: (cm) 1.7 Depth: (cm) 0.2 Area: (cm) 2.67 Volume: (cm)  0.534 % Reduction in Area: 91.5% % Reduction in Volume: 83% Epithelialization: Small (1-33%) Tunneling: No Undermining: No Wound Description Classification: Partial Thickness Wound Margin: Indistinct, nonvisible Exudate Amount: Large Exudate Type: Serosanguineous Exudate Color: red, brown Foul Odor After Cleansing: No Wound Bed Granulation Amount: Small (1-33%) Exposed Structure Granulation Quality: Red, Pink Fascia Exposed: No Necrotic Amount: Large (67-100%) Fat Layer Exposed: No Necrotic Quality: Adherent Slough Tendon Exposed: No Muscle Exposed: No Stipp, Lloyd J. ([id number]) Joint Exposed: No Bone Exposed: No Limited to Skin Breakdown Periwound Skin Texture Texture Color No Abnormalities Noted: No No Abnormalities Noted: No Callus: No Atrophie Blanche: No Crepitus: No Cyanosis: No Excoriation: No Ecchymosis: No Fluctuance: No Erythema: No Friable: No Hemosiderin Staining: Yes Induration: No Mottled: No Localized Edema: No Pallor: No Rash: No Rubor: No Scarring: Yes Temperature / Pain Moisture Temperature: Cool/Cold No Abnormalities Noted: No Tenderness on Palpation: Yes Dry / Scaly: Yes Maceration: No Moist: No Wound Preparation Ulcer Cleansing: Rinsed/Irrigated with Saline Topical Anesthetic Applied: Other: Lidocaine 4%, Treatment Notes Wound #1 (Right, Medial Lower Leg) 1. Cleansed with: Clean wound with Normal Saline 2. Anesthetic Topical Lidocaine 4% cream to wound bed prior to debridement 3. Peri-wound Care: Moisturizing lotion 4. Dressing Applied: Iodoflex 5. Secondary Dressing Applied ABD Pad 7. Secured with 3 Layer Compression System - Right Lower Extremity Notes unna to Ecologist) Signed: 07/30/2016 5:06:33 PM By: Elliot Gurney, RN, BSN, Kim RN, BSN Entered By: Elliot Gurney, RN, BSN, Kim on 07/28/2016 09:40:48 PILLARS, MAREESA GATHRIGHT ([id number]) QUAMESHA, MULLET  ([id number]) -------------------------------------------------------------------------------- Vitals Details [name] Date of Service: 07/28/2016 9:30 AM Medical Record Number: [id number] Patient Account Number: [id number] Date of Birth/Sex: 1931-08-06 (82 y.o. Female) Treating RN: Huel Coventry  Primary Care Physician: Guy Begin Other Clinician: Referring Physician: Guy Begin Treating Physician/Extender: Rudene Re in Treatment: 6 Vital Signs Time Taken: 09:31 Temperature (F): 98.0 Height (in): 61 Pulse (bpm): 70 Weight (lbs): 170 Respiratory Rate (breaths/min): 18 Body Mass Index (BMI): 32.1 Blood Pressure (mmHg): 174/72 Reference Range: 80 - 120 mg / dl Notes Patient states she takes medication for HTN. Patient asymptomatic. She will follow up with PCP if it continue to be elevated. Electronic Signature(s) Signed: 07/30/2016 5:06:33 PM By: Elliot Gurney, RN, BSN, Kim RN, BSN Entered By: Elliot Gurney, RN, BSN, Kim on 07/28/2016 09:33:19

## 2016-08-04 ENCOUNTER — Encounter: Payer: Medicare Other | Admitting: Internal Medicine

## 2016-08-04 DIAGNOSIS — I70232 Atherosclerosis of native arteries of right leg with ulceration of calf: Secondary | ICD-10-CM | POA: Diagnosis not present

## 2016-08-05 NOTE — Progress Notes (Signed)
Jordan, Hopkins ([id number]) Visit Report for 08/04/2016 Chief Complaint Document Details [name], [name]. Date of Service: 08/04/2016 12:45 PM Medical Record Patient Account Number: [id number] Number: Treating RN: [name] [birth year] (82 y.o. Other Clinician: Date of Birth/Sex: Female) Treating Vicci Reder Primary Care Physician/Extender: Letitia Libra, NARGES Physician: Referring Physician: Guy Begin Weeks in Treatment: 7 Information Obtained from: Patient Chief Complaint 06/10/16; the patient is here for review of a wound on the right anterior medial lower leg above the ankle Electronic Signature(s) Signed: 08/05/2016 7:31:48 AM By: Baltazar Najjar MD Entered By: Baltazar Najjar on 08/04/2016 13:22:15 Marasco, [name] ([id number]) -------------------------------------------------------------------------------- Debridement Details [name] Date of Service: 08/04/2016 12:45 PM Medical Record Patient Account Number: [id number] Number: Treating RN: [name] Oct 05, 1931 (82 y.o. Other Clinician: Date of Birth/Sex: Female) Treating Jerin Franzel Primary Care Physician/Extender: Letitia Libra, NARGES Physician: Referring Physician: Guy Begin Weeks in Treatment: 7 Debridement Performed for Wound #1 Right,Medial Lower Leg Assessment: Performed By: Physician Maxwell Caul, MD Debridement: Debridement Pre-procedure Yes - 13:18 Verification/Time Out Taken: Start Time: 13:18 Pain Control: Other : lidocaine 4% cream Level: Skin/Subcutaneous Tissue Total Area Debrided (L x 0.5 (cm) x 0.3 (cm) = 0.15 (cm) W): Tissue and other Viable, Non-Viable, Exudate, Fibrin/Slough, Subcutaneous material debrided: Instrument: Curette Bleeding: Minimum Hemostasis Achieved: Pressure End Time: 13:20 Procedural Pain: 0 Post Procedural Pain: 0 Response to Treatment: Procedure was tolerated well Post Debridement  Measurements of Total Wound Length: (cm) 0.5 Width: (cm) 0.3 Depth: (cm) 0.3 Volume: (cm) 0.035 Character of Wound/Ulcer Post Stable Debridement: Severity of Tissue Post Debridement: Limited to breakdown of skin Post Procedure Diagnosis Same as Pre-procedure Electronic Signature(s) Signed: 08/04/2016 3:40:23 PM By: Elana Alm ([id number]) Signed: 08/05/2016 7:31:48 AM By: Baltazar Najjar MD Entered By: Baltazar Najjar on 08/04/2016 13:21:55 Santagata, [name] ([id number]) -------------------------------------------------------------------------------- HPI Details [name] Date of Service: 08/04/2016 12:45 PM Medical Record Patient Account Number: [id number] Number: Treating RN: [name] 11-08-1931 (82 y.o. Other Clinician: Date of Birth/Sex: Female) Treating Maddax Palinkas Primary Care Physician/Extender: Letitia Libra, NARGES Physician: Referring Physician: Guy Begin Weeks in Treatment: 7 History of Present Illness HPI Description: 06/10/16; this is an 82 year old woman who lives near [place] with family members. She saw her primary doctor roughly over a week ago and was referred to the ER for a cellulitis in the right medial leg. She was given a round of IV antibiotics in the emergency room and discharged on oral Keflex which she is taking. She is been left with a uncomfortable leg area on the right medial lower leg. Her ABIs in this clinic were 0.8 on the right 0.6 on the left. She does not describe claudication. She is not a diabetic. Lab work in the ER showed a normal BUN and creatinine on 6/20 at 18 and 0.92 respectively white count was 10.1 hemoglobin at 13.8. She did not have any imaging studies. I can see no vascular evaluations in Epic. She does not have a prior wound history 06/17/16; the area affected on her right medial ankle is an area I thought was due to tissue damage from cellulitis last week. She is going  for venous reflux studies this afternoon, these were not ordered in this clinic at least not by me. The patient does not give a prior history of damage to the skin in this area although I wonder if she actually might not of noticed it. We have been  using's Aquacel Ag under a wrap although we will not be able to wrap her today 06/24/16: pt reports today she hasn't heard from the vascular clinic regarding proceeding with arterial studies. our office will assist with this today. she denies s/s of infection. denies problems with her wraps. 07/07/16; since I last saw this woman she was admitted to hospital from 7/16 through 7/18 with sepsis syndrome felt to be secondary to her ulcer. She was treated with Vanco and Zosyn the hospital discharged on Septra. Blood cultures were negative. Urine culture showed multiple species she is now feeling well using Santyl with border foam to her wound. [facility] has [facility] 07/14/16; no major improvement here. She has a quarter-sized wound covered with thick adherent slough on the lateral aspect of her right leg with several surrounding satellite lesions in a similar state. She has had 2 recent bouts of cellulitis, one before she came to this clinic and one required a recent hospitalization. I have asked for arterial studies I don't believe these have ever been done. My notes suggest from early in July that she went for reflux studies although I don't know that I have ever seen these either, she did have a DVT rule out but I did not order this, this did not show a DVT but did show a Baker's cyst 07/21/2016 -- the patient had an arterial duplex study done with waveform suggestive of right femoropopliteal disease. There is also small vessel disease bilaterally. Right ABI was in the normal range of 1.0 and a TBI was 0.74. Left ABI is abnormal at 0.69 with a TBI of 0.40. Three-vessel runoff on the right and a two-vessel runoff on the left. She has an appointment to see  Dr. Kirke Corin later today. 07/28/2016 -- was seen by Dr. Kirke Corin who reviewed her arterial studies with an ABI of 0.94 on the right and Kozinski, Scout J. ([id number]) 0.69 on the left with normal right toe brachial index. Duplex showed diffuse atherosclerosis without discrete stenosis and there was a three-vessel runoff on the right below the knee. Based on these studies and the location of the ulcer he thought it was venous commended she continue with wound care consults. 08/04/16; I note the patient's reasonably functional arterial supply which is fortunate. The wound bed appears to be smaller. Debrided of surface slough. She is been using Doctor, general practice) Signed: 08/05/2016 7:31:48 AM By: Baltazar Najjar MD Entered By: Baltazar Najjar on 08/04/2016 13:24:17 Leath, [name] ([id number]) -------------------------------------------------------------------------------- Physical Exam Details Patient Name: JAISHA, VILLACRES. Date of Service: 08/04/2016 12:45 PM Medical Record Patient Account Number: [id number] Number: Treating RN: [name] 1931-06-08 (82 y.o. Other Clinician: Date of Birth/Sex: Female) Treating Lorien Shingler Primary Care Physician/Extender: Letitia Libra, NARGES Physician: Referring Physician: Guy Begin Weeks in Treatment: 7 Constitutional Sitting or standing Blood Pressure is within target range for patient.. Pulse regular and within target range for patient.Marland Kitchen Respirations regular, non-labored and within target range.. Temperature is normal and within the target range for the patient.. Cardiovascular Pedal pulses. Edema present in both extremities. Chronic venous insufficiency. Notes Wound exam; wound is debrided of skin and slough and nonviable subcutaneous tissue. Small wound but was some depth. Electronic Signature(s) Signed: 08/05/2016 7:31:48 AM By: Baltazar Najjar MD Entered By: Baltazar Najjar on 08/04/2016 13:25:31 Minter, [name]  ([id number]) -------------------------------------------------------------------------------- Physician Orders Details Patient Name: RACINE, ERBY Date of Service: 08/04/2016 12:45 PM Medical Record Patient Account Number: [id number] Number: Treating RN: [name] 04/10/1931 (  82 y.o. Other Clinician: Date of Birth/Sex: Female) Treating Jisela Merlino Primary Care Physician/Extender: Letitia Libra, NARGES Physician: Referring Physician: [name] in Treatment: 7 Verbal / Phone Orders: Yes Clinician: Ashok Cordia, Debi Read Back and Verified: Yes Diagnosis Coding Wound Cleansing Wound #1 Right,Medial Lower Leg o Cleanse wound with mild soap and water Skin Barriers/Peri-Wound Care Wound #1 Right,Medial Lower Leg o Moisturizing lotion - to dry skin Primary Wound Dressing Wound #1 Right,Medial Lower Leg o Prisma Ag - cut to fit the wound and moisten with saline Secondary Dressing Wound #1 Right,Medial Lower Leg o ABD pad o Dry Gauze Dressing Change Frequency Wound #1 Right,Medial Lower Leg o Other: - Pt will get dressing changes on Tuesday on clinic and on Friday by [facility]. Follow-up Appointments Wound #1 Right,Medial Lower Leg o Return Appointment in 1 week. Edema Control Wound #1 Right,Medial Lower Leg o 3 Layer Compression System - Right Lower Extremity - 3cm from toes to 3cm from knee anchor with unna o Elevate legs to the level of the heart and pump ankles as often as possible Additional Orders / Instructions MILEAH, HEMMER. ([id number]) Wound #1 Right,Medial Lower Leg o Increase protein intake. o Activity as tolerated Home Health Wound #1 Right,Medial Lower Leg o Continue Home Health Visits - [facility] Please change wrap on Friday. o Home Health Nurse may visit PRN to address patientos wound care needs. o FACE TO FACE ENCOUNTER: MEDICARE and MEDICAID PATIENTS: I certify that this patient is under my care and that I  had a face-to-face encounter that meets the physician face-to-face encounter requirements with this patient on this date. The encounter with the patient was in whole or in part for the following MEDICAL CONDITION: (primary reason for Home Healthcare) MEDICAL NECESSITY: I certify, that based on my findings, NURSING services are a medically necessary home health service. HOME BOUND STATUS: I certify that my clinical findings support that this patient is homebound (i.e., Due to illness or injury, pt requires aid of supportive devices such as crutches, cane, wheelchairs, walkers, the use of special transportation or the assistance of another person to leave their place of residence. There is a normal inability to leave the home and doing so requires considerable and taxing effort. Other absences are for medical reasons / religious services and are infrequent or of short duration when for other reasons). o If current dressing causes regression in wound condition, may D/C ordered dressing product/s and apply Normal Saline Moist Dressing daily until next Wound Healing Center / Other MD appointment. Notify Wound Healing Center of regression in wound condition at [phone]. o Please direct any NON-WOUND related issues/requests for orders to patient's Primary Care Physician Electronic Signature(s) Signed: 08/04/2016 3:40:23 PM By: [name] Signed: 08/05/2016 7:31:48 AM By: Baltazar Najjar MD Entered By: [name] on 08/04/2016 13:21:03 MELENA, HAYES ([id number]) -------------------------------------------------------------------------------- Problem List Details Patient Name: LINSY, EHRESMAN. Date of Service: 08/04/2016 12:45 PM Medical Record Patient Account Number: [id number] Number: Treating RN: [name] 1931-06-25 (82 y.o. 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 ([id number]) -------------------------------------------------------------------------------- Progress Note Details Patient Name: Jordan BandaFULLER, Margert J. Date of Service: 08/04/2016 12:45 PM Medical Record Patient Account Number: [id number] Number: Treating RN: Phillis Haggisinkerton, Debi 05-11-1931 (82 y.o. Other Clinician: Date of Birth/Sex: Female) Treating Veida Spira Primary Care Physician/Extender: Letitia LibraG FARAHI, NARGES Physician: Referring Physician: [name], NARGES Weeks in Treatment: 7 Subjective Chief Complaint Information obtained from Patient 06/10/16; the patient is here for review of a wound on the right anterior medial lower leg above the ankle History of Present Illness (HPI) 06/10/16; this is an 82 year old [place]. She saw her primary doctor roughly over a week ago and was referred to the ER for a cellulitis in the right medial leg. She was given a round of IV antibiotics in the emergency room and discharged on oral Keflex which she is taking. She is been left with a uncomfortable leg area on the right medial lower leg. Her ABIs in this clinic were 0.8 on the right 0.6 on the left. She does not describe claudication. She is not a diabetic. Lab work in the ER showed a normal BUN and creatinine on 6/20 at 18 and 0.92 respectively white count was 10.1 hemoglobin at 13.8. She  did not have any imaging studies. I can see no vascular evaluations in Epic. She does not have a prior wound history 06/17/16; the area affected on her right medial ankle is an area I thought was due to tissue damage from cellulitis last week. She is going for venous reflux studies this afternoon, these were not ordered in this clinic at least not by me. The patient does not give a prior history of damage to the skin in this area although I wonder if she actually might not of noticed it. We have been using's Aquacel Ag under a wrap although we will not be able to wrap her today 06/24/16: pt reports today she hasn't heard from the vascular clinic regarding proceeding with arterial studies. our office will assist with this today. she denies s/s of infection. denies problems with her wraps. 07/07/16; since I last saw this woman she was admitted to hospital from 7/16 through 7/18 with sepsis syndrome felt to be secondary to her ulcer. She was treated with Vanco and Zosyn the hospital discharged on Septra. Blood cultures were negative. Urine culture showed multiple species she is now feeling well using Santyl with border foam to her wound. [facility] 07/14/16; no major improvement here. She has a quarter-sized wound covered with thick adherent slough on the lateral aspect of her right leg with several surrounding satellite lesions in a similar state. She has had 2 recent bouts of cellulitis, one before she came to this clinic and one required a recent hospitalization. I have asked for arterial studies I don't believe these have ever been done. My notes suggest from early in July that she went for reflux studies although I don't know that I have ever seen these either, she did have a DVT rule out but I did not order this, this did not show a DVT but did show a Baker's cyst 07/21/2016 -- the patient had an arterial duplex study done with waveform suggestive of right femoropopliteal Plaugher,  Citlalli J. ([id number]) disease. There is also small vessel disease bilaterally. Right ABI was in the normal range of 1.0 and a TBI was 0.74. Left ABI is abnormal at 0.69 with a TBI  of 0.40. Three-vessel runoff on the right and a two-vessel runoff on the left. She has an appointment to see Dr. Kirke Corin later today. 07/28/2016 -- was seen by Dr. Kirke Corin who reviewed her arterial studies with an ABI of 0.94 on the right and 0.69 on the left with normal right toe brachial index. Duplex showed diffuse atherosclerosis without discrete stenosis and there was a three-vessel runoff on the right below the knee. Based on these studies and the location of the ulcer he thought it was venous commended she continue with wound care consults. 08/04/16; I note the patient's reasonably functional arterial supply which is fortunate. The wound bed appears to be smaller. Debrided of surface slough. She is been using Iodoflex Objective Constitutional Sitting or standing Blood Pressure is within target range for patient.. Pulse regular and within target range for patient.Marland Kitchen Respirations regular, non-labored and within target range.. Temperature is normal and within the target range for the patient.. Vitals Time Taken: 12:57 PM, Height: 61 in, Weight: 170 lbs, BMI: 32.1, Temperature: 98.0 F, Pulse: 67 bpm, Respiratory Rate: 18 breaths/min, Blood Pressure: 167/76 mmHg. Cardiovascular Pedal pulses. Edema present in both extremities. Chronic venous insufficiency. General Notes: Wound exam; wound is debrided of skin and slough and nonviable subcutaneous tissue. Small wound but was some depth. Integumentary (Hair, Skin) Wound #1 status is Open. Original cause of wound was Gradually Appeared. The wound is located on the Right,Medial Lower Leg. The wound measures 0.5cm length x 0.3cm width x 0.2cm depth; 0.118cm^2 area and 0.024cm^3 volume. The wound is limited to skin breakdown. There is no tunneling noted. There is a large  amount of serosanguineous drainage noted. The wound margin is indistinct and nonvisible. There is medium (34-66%) red, pink granulation within the wound bed. There is a medium (34-66%) amount of necrotic tissue within the wound bed including Adherent Slough. The periwound skin appearance exhibited: Scarring, Dry/Scaly, Hemosiderin Staining. The periwound skin appearance did not exhibit: Callus, Crepitus, Excoriation, Fluctuance, Friable, Induration, Localized Edema, Rash, Maceration, Moist, Atrophie Blanche, Cyanosis, Ecchymosis, Mottled, Pallor, Rubor, Erythema. Periwound temperature was noted as Cool/Cold. The periwound has tenderness on palpation. REMI, RESTER ([id number]) Assessment Active Problems ICD-10 [phone] - Non-pressure chronic ulcer of right calf limited to breakdown of skin L03.115 - Cellulitis of right lower limb I70.232 - Atherosclerosis of native arteries of right leg with ulceration of calf Procedures Wound #1 Wound #1 is a Venous Leg Ulcer located on the Right,Medial Lower Leg . There was a Skin/Subcutaneous Tissue Debridement ([id number]) debridement with total area of 0.15 sq cm performed by Maxwell Caul, MD. with the following instrument(s): Curette to remove Viable and Non-Viable tissue/material including Exudate, Fibrin/Slough, and Subcutaneous after achieving pain control using Other (lidocaine 4% cream). A time out was conducted at 13:18, prior to the start of the procedure. A Minimum amount of bleeding was controlled with Pressure. The procedure was tolerated well with a pain level of 0 throughout and a pain level of 0 following the procedure. Post Debridement Measurements: 0.5cm length x 0.3cm width x 0.3cm depth; 0.035cm^3 volume. Character of Wound/Ulcer Post Debridement is stable. Severity of Tissue Post Debridement is: Limited to breakdown of skin. Post procedure Diagnosis Wound #1: Same as Pre-Procedure Plan Wound Cleansing: Wound #1 Right,Medial  Lower Leg: Cleanse wound with mild soap and water Skin Barriers/Peri-Wound Care: Wound #1 Right,Medial Lower Leg: Moisturizing lotion - to dry skin Primary Wound Dressing: Wound #1 Right,Medial Lower Leg: Prisma Ag - cut to fit the wound and moisten  with saline Secondary Dressing: Wound #1 Right,Medial Lower Leg: ABD pad Dry Gauze Dressing Change Frequency: Jordan BandaFULLER, Chinmayi J. ([id number]) Wound #1 Right,Medial Lower Leg: Other: - Pt will get dressing changes on Tuesday on clinic and [facility] Follow-up Appointments: Wound #1 Right,Medial Lower Leg: Return Appointment in 1 week. Edema Control: Wound #1 Right,Medial Lower Leg: 3 Layer Compression System - Right Lower Extremity - 3cm from toes to 3cm from knee anchor with unna Elevate legs to the level of the heart and pump ankles as often as possible Additional Orders / Instructions: Wound #1 Right,Medial Lower Leg: Increase protein intake. Activity as tolerated Home Health: Wound #1 Right,Medial Lower Leg: Continue Home Health Visits - [facility] Please change wrap on Friday. Home Health Nurse may visit PRN to address patient s wound care needs. FACE TO FACE ENCOUNTER: MEDICARE and MEDICAID PATIENTS: I certify that this patient is under my care and that I had a face-to-face encounter that meets the physician face-to-face encounter requirements with this patient on this date. The encounter with the patient was in whole or in part for the following MEDICAL CONDITION: (primary reason for Home Healthcare) MEDICAL NECESSITY: I certify, that based on my findings, NURSING services are a medically necessary home health service. HOME BOUND STATUS: I certify that my clinical findings support that this patient is homebound (i.e., Due to illness or injury, pt requires aid of supportive devices such as crutches, cane, wheelchairs, walkers, the use of special transportation or the assistance of another person to leave their place of  residence. There is a normal inability to leave the home and doing so requires considerable and taxing effort. Other absences are for medical reasons / religious services and are infrequent or of short duration when for other reasons). If current dressing causes regression in wound condition, may D/C ordered dressing product/s and apply Normal Saline Moist Dressing daily until next Wound Healing Center / Other MD appointment. Notify Wound Healing Center of regression in wound condition at [phone]. Please direct any NON-WOUND related issues/requests for orders to patient's Primary Care Physician I am changing the dressing from Iodoflex to Prisma. profore lite wraps Electronic Signature(s) Signed: 08/05/2016 7:31:48 AM By: Baltazar Najjarobson, Laneya Gasaway MD Entered By: Baltazar Najjarobson, Aragon Scarantino on 08/04/2016 13:26:20 Butterfield, [name] ([id number]) -------------------------------------------------------------------------------- SuperBill Details Patient Name: Jordan BandaFULLER, Mauri J. Date of Service: 08/04/2016 Medical Record Patient Account Number: [id number] Number: Treating RN: Phillis Haggisinkerton, Debi 10-30-1931 (82 y.o. Other Clinician: Date of Birth/Sex: Female) Treating Edd Reppert Primary Care Physician/Extender: Letitia LibraG FARAHI, NARGES Physician: Weeks in Treatment: 7 Referring Physician: [name], NARGES Diagnosis Coding ICD-10 Codes Code Description 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down of skin L03.115 Cellulitis of right lower limb I70.232 Atherosclerosis of native arteries of right leg with ulceration of calf Facility Procedures CPT4 Code Description: [id number] 11042 - DEB SUBQ TISSUE 20 SQ CM/< ICD-10 Description Diagnosis 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 Modifier: reakdown of ski Quantity: 1 n Physician Procedures CPT4 Code Description: [id number] 11042 - WC PHYS SUBQ TISS 20 SQ CM ICD-10 Description Diagnosis L97.211 Non-pressure chronic ulcer of  right calf limited to b Modifier: reakdown of ski Quantity: 1 n Electronic Signature(s) Signed: 08/05/2016 7:31:48 AM By: Baltazar Najjarobson, Fotios Amos MD Entered By: Baltazar Najjarobson, Starlene Consuegra on 08/04/2016 13:26:54

## 2016-08-05 NOTE — Progress Notes (Signed)
[name], Jordan J. ([id number]) Visit Report for 08/04/2016 Arrival Information Details [name], Jordan J. Date of Service: 08/04/2016 12:45 PM Medical Record Patient Account Number: [id number] Number: Treating RN: Phillis Haggisinkerton, Debi Mar 15, 1931 (82 y.o. Other Clinician: Date of Birth/Sex: Female) Treating ROBSON, MICHAEL Primary Care Physician: [name], NARGES Physician/Extender: G Referring Physician: Gabriel RungFARAHI, NARGES Weeks in Treatment: 7 Visit Information History Since Last Visit All ordered tests and consults were completed: No Patient Arrived: Cane Added or deleted any medications: No Arrival Time: 12:52 Any new allergies or adverse reactions: No Accompanied By: daughter Had [name] fall or experienced change in No Transfer Assistance: None activities of daily living that may affect Patient Identification Verified: Yes risk of falls: Secondary Verification Process Yes Signs or symptoms of abuse/neglect since last No Completed: visito Patient Requires Transmission-Based No Hospitalized since last visit: No Precautions: Pain Present Now: No Patient Has Alerts: No Electronic Signature(s) Signed: 08/04/2016 3:40:23 PM By: Alejandro MullingPinkerton, Debra Entered By: Alejandro MullingPinkerton, Debra on 08/04/2016 12:55:08 Sebastiani, [name] ([id number]) -------------------------------------------------------------------------------- Encounter Discharge Information Details [name], Jordan J. Date of Service: 08/04/2016 12:45 PM Medical Record Patient Account Number: [id number] Number: Treating RN: Phillis Haggisinkerton, Debi Mar 15, 1931 (82 y.o. Other Clinician: Date of Birth/Sex: Female) Treating ROBSON, MICHAEL Primary Care Physician: [name], NARGES Physician/Extender: G Referring Physician: Gabriel RungFARAHI, NARGES Weeks in Treatment: 7 Encounter Discharge Information Items Discharge Pain Level: 0 Discharge Condition: Stable Ambulatory Status: Cane Discharge Destination:  Home Transportation: Private Auto Accompanied By: daughter Schedule Follow-up Appointment: Yes Medication Reconciliation completed Yes and provided to Patient/Care Jamie-Lee Galdamez: Provided on Clinical Summary of Care: 08/04/2016 Form Type Recipient Paper Patient CF Electronic Signature(s) Signed: 08/04/2016 1:36:58 PM By: Gwenlyn PerkingMoore, Shelia Entered By: Gwenlyn PerkingMoore, Shelia on 08/04/2016 13:36:58 Muhs, [name] ([id number]) -------------------------------------------------------------------------------- Lower Extremity Assessment Details [name], Jordan J. Date of Service: 08/04/2016 12:45 PM Medical Record Patient Account Number: [id number] Number: Treating RN: Phillis Haggisinkerton, Debi Mar 15, 1931 (82 y.o. Other Clinician: Date of Birth/Sex: Female) Treating ROBSON, MICHAEL Primary Care Physician: [name], NARGES Physician/Extender: G Referring Physician: [name], NARGES Weeks in Treatment: 7 Edema Assessment Assessed: [Left: No] [Right: No] E[Left: dema] [Right: :] Calf Left: Right: Point of Measurement: 32 cm From Medial Instep cm 33 cm Ankle Left: Right: Point of Measurement: 10 cm From Medial Instep cm 23 cm Vascular Assessment Pulses: Posterior Tibial Dorsalis Pedis Palpable: [Right:Yes] Extremity colors, hair growth, and conditions: Extremity Color: [Right:Hyperpigmented] Temperature of Extremity: [Right:Warm] Capillary Refill: [Right:< 3 seconds] Toe Nail Assessment Left: Right: Thick: No Discolored: No Deformed: No Improper Length and Hygiene: No Electronic Signature(s) Signed: 08/04/2016 3:40:23 PM By: Alejandro MullingPinkerton, Debra Entered By: Alejandro MullingPinkerton, Debra on 08/04/2016 13:08:27 [name], Jordan J. ([id number]) Telford, Jordan Shela CommonsJ. ([id number]) -------------------------------------------------------------------------------- Multi Wound Chart Details [name], Jordan J. Date of Service: 08/04/2016 12:45 PM Medical Record Patient Account Number:  [id number] Number: Treating RN: Phillis Haggisinkerton, Debi Mar 15, 1931 (82 y.o. Other Clinician: Date of Birth/Sex: Female) Treating ROBSON, MICHAEL Primary Care Physician: [name], NARGES Physician/Extender: G Referring Physician: [name], NARGES Weeks in Treatment: 7 Vital Signs Height(in): 61 Pulse(bpm): 67 Weight(lbs): 170 Blood Pressure 167/76 (mmHg): Body Mass Index(BMI): 32 Temperature(F): 98.0 Respiratory Rate 18 (breaths/min): Photos: [1:No Photos] [N/[name]:N/[name]] Wound Location: [1:Right Lower Leg - Medial] [N/[name]:N/[name]] Wounding Event: [1:Gradually Appeared] [N/[name]:N/[name]] Primary Etiology: [1:Venous Leg Ulcer] [N/[name]:N/[name]] Comorbid History: [1:Cataracts, Arrhythmia, Hypertension, Osteoarthritis] [N/[name]:N/[name]] Date Acquired: [1:06/01/2016] [N/[name]:N/[name]] Weeks of Treatment: [1:7] [N/[name]:N/[name]] Wound Status: [1:Open] [N/[name]:N/[name]] Measurements L x W x D 0.5x0.3x0.2 [N/[name]:N/[name]] (cm) Area (cm) : [1:0.118] [N/[name]:N/[name]] Volume (cm) : [1:0.024] [N/[name]:N/[name]] %  Reduction in Area: [1:99.60%] [N/[name]:N/[name]] % Reduction in Volume: 99.20% [N/[name]:N/[name]] Classification: [1:Partial Thickness] [N/[name]:N/[name]] Exudate Amount: [1:Large] [N/[name]:N/[name]] Exudate Type: [1:Serosanguineous] [N/[name]:N/[name]] Exudate Color: [1:red, brown] [N/[name]:N/[name]] Wound Margin: [1:Indistinct, nonvisible] [N/[name]:N/[name]] Granulation Amount: [1:Medium (34-66%)] [N/[name]:N/[name]] Granulation Quality: [1:Red, Pink] [N/[name]:N/[name]] Necrotic Amount: [1:Medium (34-66%)] [N/[name]:N/[name]] Exposed Structures: [1:Fascia: No Fat: No Tendon: No Muscle: No] [N/[name]:N/[name]] Joint: No Bone: No Limited to Skin Breakdown Epithelialization: Medium (34-66%) N/[name] N/[name] Periwound Skin Texture: Scarring: Yes N/[name] N/[name] Edema: No Excoriation: No Induration: No Callus: No Crepitus: No Fluctuance: No Friable: No Rash: No Periwound Skin Dry/Scaly: Yes N/[name] N/[name] Moisture: Maceration: No Moist: No Periwound Skin Color: Hemosiderin Staining: Yes N/[name] N/[name] Atrophie Blanche: No Cyanosis: No Ecchymosis: No Erythema:  No Mottled: No Pallor: No Rubor: No Temperature: Cool/Cold N/[name] N/[name] Tenderness on Yes N/[name] N/[name] Palpation: Wound Preparation: Ulcer Cleansing: N/[name] N/[name] Rinsed/Irrigated with Saline Topical Anesthetic Applied: Other: Lidocaine 4% Treatment Notes Electronic Signature(s) Signed: 08/04/2016 3:40:23 PM By: Alejandro MullingPinkerton, Debra Entered By: Alejandro MullingPinkerton, Debra on 08/04/2016 13:10:42 Chamber, [name] ([id number]) -------------------------------------------------------------------------------- Multi-Disciplinary Care Plan Details [name], Jordan J. Date of Service: 08/04/2016 12:45 PM Medical Record Patient Account Number: [id number] Number: Treating RN: Phillis Haggisinkerton, Debi [birth year] (82 y.o. Other Clinician: Date of Birth/Sex: Female) Treating ROBSON, MICHAEL Primary Care Physician: [name], NARGES Physician/Extender: G Referring Physician: Gabriel RungFARAHI, NARGES Weeks in Treatment: 7 Active Inactive Orientation to the Wound Care Program Nursing Diagnoses: Knowledge deficit related to the wound healing center program Goals: Patient/caregiver will verbalize understanding of the Wound Healing Center Program Date Initiated: 06/10/2016 Goal Status: Active Interventions: Provide education on orientation to the wound center Notes: Venous Leg Ulcer Nursing Diagnoses: Potential for venous Insuffiency (use before diagnosis confirmed) Goals: Non-invasive venous studies are completed as ordered Date Initiated: 06/10/2016 Goal Status: Active Patient will maintain optimal edema control Date Initiated: 06/10/2016 Goal Status: Active Patient/caregiver will verbalize understanding of disease process and disease management Date Initiated: 06/10/2016 Goal Status: Active Verify adequate tissue perfusion prior to therapeutic compression application Date Initiated: 06/10/2016 Goal Status: Active Interventions: [name], Jordan J. ([id number]) Assess peripheral edema status every  visit. Compression as ordered Provide education on venous insufficiency Treatment Activities: Non-invasive vascular studies : 06/10/2016 Notes: Wound/Skin Impairment Nursing Diagnoses: Impaired tissue integrity Knowledge deficit related to ulceration/compromised skin integrity Goals: Patient/caregiver will verbalize understanding of skin care regimen Date Initiated: 06/10/2016 Goal Status: Active Ulcer/skin breakdown will have [name] volume reduction of 30% by week 4 Date Initiated: 06/10/2016 Goal Status: Active Ulcer/skin breakdown will have [name] volume reduction of 50% by week 8 Date Initiated: 06/10/2016 Goal Status: Active Ulcer/skin breakdown will have [name] volume reduction of 80% by week 12 Date Initiated: 06/10/2016 Goal Status: Active Ulcer/skin breakdown will heal within 14 weeks Date Initiated: 06/10/2016 Goal Status: Active Interventions: Assess patient/caregiver ability to obtain necessary supplies Assess patient/caregiver ability to perform ulcer/skin care regimen upon admission and as needed Assess ulceration(s) every visit Provide education on ulcer and skin care Treatment Activities: Skin care regimen initiated : 06/10/2016 Topical wound management initiated : 06/10/2016 Notes: Electronic Signature(s) [name], Simmone J. ([id number]) Signed: 08/04/2016 3:40:23 PM By: Alejandro MullingPinkerton, Debra Entered By: Alejandro MullingPinkerton, Debra on 08/04/2016 13:10:34 Behrendt, [name] ([id number]) -------------------------------------------------------------------------------- Pain Assessment Details [name], Jordan J. Date of Service: 08/04/2016 12:45 PM Medical Record Patient Account Number: [id number] Number: Treating RN: Phillis Haggisinkerton, Debi [birth year] (82 y.o. Other Clinician: Date of Birth/Sex: Female) Treating ROBSON, MICHAEL Primary Care Physician: [name], NARGES Physician/Extender: G Referring Physician: [name], NARGES Weeks in Treatment: 7 Active Problems Location  of Pain  Severity and Description of Pain Patient Has Paino No Site Locations With Dressing Change: No Pain Management and Medication Current Pain Management: Electronic Signature(s) Signed: 08/04/2016 3:40:23 PM By: [name] Entered By: [name] on 08/04/2016 12:55:14 Kosar, Jordan Rhyme ([id number]) -------------------------------------------------------------------------------- Patient/Caregiver Education Details Patient Name: Jordan Banda. Date of Service: 08/04/2016 12:45 PM Medical Record Patient Account Number: [id number] Number: Treating RN: [name] 26-Aug-1931 (82 y.o. Other Clinician: Date of Birth/Gender: Female) Treating ROBSON, MICHAEL Primary Care Physician: Guy Begin Physician/Extender: G Referring Physician: [name] in Treatment: 7 Education Assessment Education Provided To: Patient Education Topics Provided Wound/Skin Impairment: Handouts: Other: do not get wrap wet Methods: Demonstration, Explain/Verbal Responses: State content correctly Electronic Signature(s) Signed: 08/04/2016 3:40:23 PM By: [name] Entered By: [name] on 08/04/2016 13:16:24 Perman, Lawonda Shela Commons ([id number]) -------------------------------------------------------------------------------- Wound Assessment Details Patient Name: Jordan Banda. Date of Service: 08/04/2016 12:45 PM Medical Record Patient Account Number: [id number] Number: Treating RN: [name] 1931-07-04 (82 y.o. Other Clinician: Date of Birth/Sex: Female) Treating ROBSON, MICHAEL Primary Care Physician: Guy Begin Physician/Extender: G Referring Physician: Guy Begin Weeks in Treatment: 7 Wound Status Wound Number: 1 Primary Venous Leg Ulcer Etiology: Wound Location: Right Lower Leg - Medial Wound Status: Open Wounding Event: Gradually Appeared Comorbid Cataracts, Arrhythmia, Hypertension, Date Acquired: 06/01/2016 History:  Osteoarthritis Weeks Of Treatment: 7 Clustered Wound: No Photos Photo Uploaded By: [name] on 08/04/2016 14:40:01 Wound Measurements Length: (cm) 0.5 Width: (cm) 0.3 Depth: (cm) 0.2 Area: (cm) 0.118 Volume: (cm) 0.024 % Reduction in Area: 99.6% % Reduction in Volume: 99.2% Epithelialization: Medium (34-66%) Tunneling: No Wound Description Classification: Partial Thickness Wound Margin: Indistinct, nonvisible Exudate Amount: Large Exudate Type: Serosanguineous Exudate Color: red, brown Foul Odor After Cleansing: No Wound Bed Granulation Amount: Medium (34-66%) Exposed Structure Granulation Quality: Red, Pink Fascia Exposed: No Necrotic Amount: Medium (34-66%) Fat Layer Exposed: No Micheli, Kailah J. ([id number]) Necrotic Quality: Adherent Slough Tendon Exposed: No Muscle Exposed: No Joint Exposed: No Bone Exposed: No Limited to Skin Breakdown Periwound Skin Texture Texture Color No Abnormalities Noted: No No Abnormalities Noted: No Callus: No Atrophie Blanche: No Crepitus: No Cyanosis: No Excoriation: No Ecchymosis: No Fluctuance: No Erythema: No Friable: No Hemosiderin Staining: Yes Induration: No Mottled: No Localized Edema: No Pallor: No Rash: No Rubor: No Scarring: Yes Temperature / Pain Moisture Temperature: Cool/Cold No Abnormalities Noted: No Tenderness on Palpation: Yes Dry / Scaly: Yes Maceration: No Moist: No Wound Preparation Ulcer Cleansing: Rinsed/Irrigated with Saline Topical Anesthetic Applied: Other: Lidocaine 4%, Treatment Notes Wound #1 (Right, Medial Lower Leg) 1. Cleansed with: Cleanse wound with antibacterial soap and water 2. Anesthetic Topical Lidocaine 4% cream to wound bed prior to debridement 3. Peri-wound Care: Moisturizing lotion 4. Dressing Applied: Prisma Ag 5. Secondary Dressing Applied ABD Pad Dry Gauze 7. Secured with Tape 3 Layer Compression System - Right Lower Extremity Notes unna to  anchor LAELYNN, BLIZZARD ([id number]) Electronic Signature(s) Signed: 08/04/2016 3:40:23 PM By: [name] Entered By: [name] on 08/04/2016 13:09:16 Mcelhaney, Jordan Rhyme ([id number]) -------------------------------------------------------------------------------- Vitals Details Patient Name: Jordan Banda Date of Service: 08/04/2016 12:45 PM Medical Record Patient Account Number: [id number] Number: Treating RN: [name] 03/11/1931 (82 y.o. Other Clinician: Date of Birth/Sex: Female) Treating ROBSON, MICHAEL Primary Care Physician: Guy Begin Physician/Extender: G Referring Physician: Guy Begin Weeks in Treatment: 7 Vital Signs Time Taken: 12:57 Temperature (F): 98.0 Height (in): 61 Pulse (bpm): 67 Weight (lbs): 170 Respiratory Rate (breaths/min): 18 Body  Mass Index (BMI): 32.1 Blood Pressure (mmHg): 167/76 Reference Range: 80 - 120 mg / dl Electronic Signature(s) Signed: 08/04/2016 3:40:23 PM By: [name] Entered By: [name] on 08/04/2016 12:58:08

## 2016-08-11 ENCOUNTER — Encounter: Payer: Medicare Other | Admitting: Internal Medicine

## 2016-08-11 DIAGNOSIS — I70232 Atherosclerosis of native arteries of right leg with ulceration of calf: Secondary | ICD-10-CM | POA: Diagnosis not present

## 2016-08-11 NOTE — Progress Notes (Addendum)
DARALYN, BERT ([id number]) Visit Report for 08/11/2016 Arrival Information Details Patient Name: LASHAN, MACIAS. Date of Service: 08/11/2016 12:45 PM Medical Record Patient Account Number: [id number] Number: Treating RN: [name] 08/16/1931 (82 y.o. Other Clinician: Date of Birth/Sex: Female) Treating ROBSON, MICHAEL Primary Care Physician: Guy Begin Physician/Extender: G Referring Physician: [name] in Treatment: 8 Visit Information History Since Last Visit All ordered tests and consults were completed: No Patient Arrived: Cane Added or deleted any medications: No Arrival Time: 12:53 Any new allergies or adverse reactions: No Accompanied By: self Had a fall or experienced change in No Transfer Assistance: None activities of daily living that may affect Patient Identification Verified: Yes risk of falls: Secondary Verification Process Completed: Yes Signs or symptoms of abuse/neglect since last No Patient Requires Transmission-Based No visito Precautions: Hospitalized since last visit: No Patient Has Alerts: No Pain Present Now: No Electronic Signature(s) Signed: 08/11/2016 4:22:14 PM By: [name] Entered By: [name] on 08/11/2016 12:53:39 Critcher, [name] ([id number]) -------------------------------------------------------------------------------- Encounter Discharge Information Details Patient Name: [name]. Date of Service: 08/11/2016 12:45 PM Medical Record Patient Account Number: [id number] Number: Treating RN: [name] Aug 09, 1931 (82 y.o. Other Clinician: Date of Birth/Sex: Female) Treating ROBSON, MICHAEL Primary Care Physician: Guy Begin Physician/Extender: G Referring Physician: [name] in Treatment: 8 Encounter Discharge Information Items Discharge Pain Level: 0 Discharge Condition: Stable Ambulatory Status: Cane Discharge Destination: Home Transportation:  Private Auto Accompanied By: self Schedule Follow-up Appointment: Yes Medication Reconciliation completed Yes and provided to Patient/Care Aaryn Sermon: Provided on Clinical Summary of Care: 08/11/2016 Form Type Recipient Paper Patient CF Electronic Signature(s) Signed: 08/11/2016 1:37:10 PM By: Gwenlyn Perking Entered By: Gwenlyn Perking on 08/11/2016 13:37:10 Jackson, [name] ([id number]) -------------------------------------------------------------------------------- Lower Extremity Assessment Details Patient Name: [name]. Date of Service: 08/11/2016 12:45 PM Medical Record Patient Account Number: [id number] Number: Treating RN: [name] 1931-05-05 (82 y.o. Other Clinician: Date of Birth/Sex: Female) Treating ROBSON, MICHAEL Primary Care Physician: Guy Begin Physician/Extender: G Referring Physician: Guy Begin Weeks in Treatment: 8 Edema Assessment Assessed: [Left: No] [Right: No] E[Left: dema] [Right: :] Calf Left: Right: Point of Measurement: 32 cm From Medial Instep cm 33 cm Ankle Left: Right: Point of Measurement: 10 cm From Medial Instep cm 23 cm Vascular Assessment Pulses: Posterior Tibial Dorsalis Pedis Palpable: [Right:Yes] Extremity colors, hair growth, and conditions: Extremity Color: [Right:Hyperpigmented] Temperature of Extremity: [Right:Warm] Capillary Refill: [Right:< 3 seconds] Electronic Signature(s) Signed: 08/11/2016 4:22:14 PM By: [name] Entered By: [name] on 08/11/2016 13:00:43 Guild, Todd Shela Commons ([id number]) -------------------------------------------------------------------------------- Multi Wound Chart Details Patient Name: [name]. Date of Service: 08/11/2016 12:45 PM Medical Record Patient Account Number: [id number] Number: Treating RN: [name] [birth year] (82 y.o. Other Clinician: Date of Birth/Sex: Female) Treating ROBSON, MICHAEL Primary Care Physician: Guy Begin Physician/Extender: G Referring Physician: Guy Begin Weeks in Treatment: 8 Vital Signs Height(in): 61 Pulse(bpm): 73 Weight(lbs): 170 Blood Pressure 138/73 (mmHg): Body Mass Index(BMI): 32 Temperature(F): 97.6 Respiratory Rate 18 (breaths/min): Photos: [1:No Photos] [N/A:N/A] Wound Location: [1:Right Lower Leg - Medial] [N/A:N/A] Wounding Event: [1:Gradually Appeared] [N/A:N/A] Primary Etiology: [1:Venous Leg Ulcer] [N/A:N/A] Comorbid History: [1:Cataracts, Arrhythmia, Hypertension, Osteoarthritis] [N/A:N/A] Date Acquired: [1:06/01/2016] [N/A:N/A] Weeks of Treatment: [1:8] [N/A:N/A] Wound Status: [1:Open] [N/A:N/A] Measurements L x W x D 0.3x0.3x0.2 [N/A:N/A] (cm) Area (cm) : [1:0.071] [N/A:N/A] Volume (cm) : [1:0.014] [N/A:N/A] % Reduction in Area: [1:99.80%] [N/A:N/A] % Reduction in Volume: 99.60% [N/A:N/A] Classification: [1:Partial Thickness] [N/A:N/A] Exudate Amount: [1:Large] [N/A:N/A]  Exudate Type: [1:Serosanguineous] [N/A:N/A] Exudate Color: [1:red, brown] [N/A:N/A] Wound Margin: [1:Indistinct, nonvisible] [N/A:N/A] Granulation Amount: [1:Large (67-100%)] [N/A:N/A] Granulation Quality: [1:Red, Pink] [N/A:N/A] Necrotic Amount: [1:Small (1-33%)] [N/A:N/A] Exposed Structures: [1:Fascia: No Fat: No Tendon: No Muscle: No] [N/A:N/A] Joint: No Bone: No Limited to Skin Breakdown Epithelialization: Medium (34-66%) N/A N/A Periwound Skin Texture: Scarring: Yes N/A N/A Edema: No Excoriation: No Induration: No Callus: No Crepitus: No Fluctuance: No Friable: No Rash: No Periwound Skin Dry/Scaly: Yes N/A N/A Moisture: Maceration: No Moist: No Periwound Skin Color: Hemosiderin Staining: Yes N/A N/A Atrophie Blanche: No Cyanosis: No Ecchymosis: No Erythema: No Mottled: No Pallor: No Rubor: No Temperature: Cool/Cold N/A N/A Tenderness on Yes N/A N/A Palpation: Wound Preparation: Ulcer Cleansing: N/A N/A Rinsed/Irrigated  with Saline Topical Anesthetic Applied: Other: Lidocaine 4% Treatment Notes Electronic Signature(s) Signed: 08/11/2016 4:22:14 PM By: [name] Entered By: [name] on 08/11/2016 13:20:06 PRIMA, RAYNER ([id number]) -------------------------------------------------------------------------------- Multi-Disciplinary Care Plan Details Patient Name: ATHEENA, SPANO. Date of Service: 08/11/2016 12:45 PM Medical Record Patient Account Number: [id number] Number: Treating RN: [name] Oct 15, 1931 (82 y.o. Other Clinician: Date of Birth/Sex: Female) Treating ROBSON, MICHAEL Primary Care Physician: Guy Begin Physician/Extender: G Referring Physician: [name] in Treatment: 8 Active Inactive Orientation to the Wound Care Program Nursing Diagnoses: Knowledge deficit related to the wound healing center program Goals: Patient/caregiver will verbalize understanding of the Wound Healing Center Program Date Initiated: 06/10/2016 Goal Status: Active Interventions: Provide education on orientation to the wound center Notes: Venous Leg Ulcer Nursing Diagnoses: Potential for venous Insuffiency (use before diagnosis confirmed) Goals: Non-invasive venous studies are completed as ordered Date Initiated: 06/10/2016 Goal Status: Active Patient will maintain optimal edema control Date Initiated: 06/10/2016 Goal Status: Active Patient/caregiver will verbalize understanding of disease process and disease management Date Initiated: 06/10/2016 Goal Status: Active Verify adequate tissue perfusion prior to therapeutic compression application Date Initiated: 06/10/2016 Goal Status: Active Interventions: TERAN, DAUGHENBAUGH. ([id number]) Assess peripheral edema status every visit. Compression as ordered Provide education on venous insufficiency Treatment Activities: Non-invasive vascular studies : 06/10/2016 Notes: Wound/Skin Impairment Nursing  Diagnoses: Impaired tissue integrity Knowledge deficit related to ulceration/compromised skin integrity Goals: Patient/caregiver will verbalize understanding of skin care regimen Date Initiated: 06/10/2016 Goal Status: Active Ulcer/skin breakdown will have a volume reduction of 30% by week 4 Date Initiated: 06/10/2016 Goal Status: Active Ulcer/skin breakdown will have a volume reduction of 50% by week 8 Date Initiated: 06/10/2016 Goal Status: Active Ulcer/skin breakdown will have a volume reduction of 80% by week 12 Date Initiated: 06/10/2016 Goal Status: Active Ulcer/skin breakdown will heal within 14 weeks Date Initiated: 06/10/2016 Goal Status: Active Interventions: Assess patient/caregiver ability to obtain necessary supplies Assess patient/caregiver ability to perform ulcer/skin care regimen upon admission and as needed Assess ulceration(s) every visit Provide education on ulcer and skin care Treatment Activities: Skin care regimen initiated : 06/10/2016 Topical wound management initiated : 06/10/2016 Notes: Electronic Signature(s) AMARIYANA, HEACOX ([id number]) Signed: 08/11/2016 4:22:14 PM By: [name] Entered By: [name] on 08/11/2016 13:19:47 Humble, Becci Shela Commons ([id number]) -------------------------------------------------------------------------------- Pain Assessment Details Patient Name: [name]. Date of Service: 08/11/2016 12:45 PM Medical Record Patient Account Number: [id number] Number: Treating RN: [name] [birth year] (82 y.o. Other Clinician: Date of Birth/Sex: Female) Treating ROBSON, MICHAEL Primary Care Physician: Guy Begin Physician/Extender: G Referring Physician: Guy Begin Weeks in Treatment: 8 Active Problems Location of Pain Severity and Description of Pain Patient Has Paino No Site Locations With Dressing Change: No Pain Management  and Medication Current Pain Management: Electronic  Signature(s) Signed: 08/11/2016 4:22:14 PM By: [name] Entered By: [name] on 08/11/2016 12:53:45 Masella, [name] ([id number]) -------------------------------------------------------------------------------- Patient/Caregiver Education Details Patient Name: [name] Date of Service: 08/11/2016 12:45 PM Medical Record Patient Account Number: [id number] Number: Treating RN: [name] [birth year] (82 y.o. Other Clinician: Date of Birth/Gender: Female) Treating ROBSON, MICHAEL Primary Care Physician: Guy Begin Physician/Extender: G Referring Physician: [name] in Treatment: 8 Education Assessment Education Provided To: Patient Education Topics Provided Wound/Skin Impairment: Handouts: Other: change dressing as ordered and do not get dressings wet Methods: Demonstration, Explain/Verbal Responses: State content correctly Electronic Signature(s) Signed: 08/11/2016 4:22:14 PM By: [name] Entered By: [name] on 08/11/2016 13:15:55 Laskowski, Nandika Shela Commons ([id number]) -------------------------------------------------------------------------------- Wound Assessment Details Patient Name: [name]. Date of Service: 08/11/2016 12:45 PM Medical Record Patient Account Number: [id number] Number: Treating RN: [name] 04/16/1931 (82 y.o. Other Clinician: Date of Birth/Sex: Female) Treating ROBSON, MICHAEL Primary Care Physician: Guy Begin Physician/Extender: G Referring Physician: Guy Begin Weeks in Treatment: 8 Wound Status Wound Number: 1 Primary Venous Leg Ulcer Etiology: Wound Location: Right Lower Leg - Medial Wound Status: Open Wounding Event: Gradually Appeared Comorbid Cataracts, Arrhythmia, Hypertension, Date Acquired: 06/01/2016 History: Osteoarthritis Weeks Of Treatment: 8 Clustered Wound: No Photos Photo Uploaded By: [name] on 08/11/2016 15:49:32 Wound  Measurements Length: (cm) 0.3 Width: (cm) 0.3 Depth: (cm) 0.2 Area: (cm) 0.071 Volume: (cm) 0.014 % Reduction in Area: 99.8% % Reduction in Volume: 99.6% Epithelialization: Medium (34-66%) Tunneling: No Undermining: No Wound Description Classification: Partial Thickness Wound Margin: Indistinct, nonvisible Exudate Amount: Large Exudate Type: Serosanguineous Exudate Color: red, brown Foul Odor After Cleansing: No Wound Bed Granulation Amount: Large (67-100%) Exposed Structure Granulation Quality: Red, Pink Fascia Exposed: No Necrotic Amount: Small (1-33%) Fat Layer Exposed: No Catino, Enda J. ([id number]) Necrotic Quality: Adherent Slough Tendon Exposed: No Muscle Exposed: No Joint Exposed: No Bone Exposed: No Limited to Skin Breakdown Periwound Skin Texture Texture Color No Abnormalities Noted: No No Abnormalities Noted: No Callus: No Atrophie Blanche: No Crepitus: No Cyanosis: No Excoriation: No Ecchymosis: No Fluctuance: No Erythema: No Friable: No Hemosiderin Staining: Yes Induration: No Mottled: No Localized Edema: No Pallor: No Rash: No Rubor: No Scarring: Yes Temperature / Pain Moisture Temperature: Cool/Cold No Abnormalities Noted: No Tenderness on Palpation: Yes Dry / Scaly: Yes Maceration: No Moist: No Wound Preparation Ulcer Cleansing: Rinsed/Irrigated with Saline Topical Anesthetic Applied: Other: Lidocaine 4%, Treatment Notes Wound #1 (Right, Medial Lower Leg) 1. Cleansed with: Cleanse wound with antibacterial soap and water 3. Peri-wound Care: Moisturizing lotion 4. Dressing Applied: Prisma Ag 5. Secondary Dressing Applied ABD Pad Dry Gauze 7. Secured with Tape 3 Layer Compression System - Left Lower Extremity Notes unna to anchor Electronic Signature(s) KATE, LAROCK ([id number]) Signed: 08/11/2016 4:22:14 PM By: [name] Entered By: [name] on 08/11/2016 13:19:37 Mitro, [name]  ([id number]) -------------------------------------------------------------------------------- Vitals Details Patient Name: [name] Date of Service: 08/11/2016 12:45 PM Medical Record Patient Account Number: [id number] Number: Treating RN: [name] [birth year] (82 y.o. Other Clinician: Date of Birth/Sex: Female) Treating ROBSON, MICHAEL Primary Care Physician: Guy Begin Physician/Extender: G Referring Physician: Guy Begin Weeks in Treatment: 8 Vital Signs Time Taken: 12:53 Temperature (F): 97.6 Height (in): 61 Pulse (bpm): 73 Weight (lbs): 170 Respiratory Rate (breaths/min): 18 Body Mass Index (BMI): 32.1 Blood Pressure (mmHg): 138/73 Reference Range: 80 - 120 mg / dl Electronic Signature(s) Signed: 08/11/2016 4:22:14 PM By: Ashok Cordia,  Debra Entered By: Alejandro MullingPinkerton, Debra on 08/11/2016 12:57:03

## 2016-08-14 NOTE — Progress Notes (Signed)
[name], Jordan J. ([id number]) Visit Report for 08/11/2016 Chief Complaint Document Details [name], Jordan J. Date of Service: 08/11/2016 12:45 PM Medical Record Patient Account Number: [id number] Number: Treating RN: [name] [birth year] (82 y.o. Other Clinician: Date of Birth/Sex: Female) Treating [name] Primary Care Physician/Extender: [name] Physician: Referring Physician: [name], Hopkins Weeks in Treatment: 8 Information Obtained from: Patient Chief Complaint 06/10/16; the patient is here for review of a wound on the right anterior medial lower leg above the ankle Electronic Signature(s) Signed: 08/12/2016 4:32:31 PM By: [name] Entered By: Jordan Najjarobson, Monserrate Blaschke on 08/12/2016 07:48:25 Hino, [name] ([id number]) -------------------------------------------------------------------------------- Debridement Details [name], Jordan J. Date of Service: 08/11/2016 12:45 PM Medical Record Patient Account Number: [id number] Number: Treating RN: [name] [birth year] (82 y.o. Other Clinician: Date of Birth/Sex: Female) Treating [name] Primary Care Physician/Extender: [name] Physician: Referring Physician: [name], Hopkins Weeks in Treatment: 8 Debridement Performed for Wound #1 Right,Medial Lower Leg Assessment: Performed By: Physician [name] Debridement: Debridement Pre-procedure Yes - 13:21 Verification/Time Out Taken: Start Time: 13:21 Pain Control: Other : lidocaine 4% cream Level: Skin/Subcutaneous Tissue Total Area Debrided (L x 0.3 (cm) x 0.3 (cm) = 0.09 (cm) W): Tissue and other Viable, Non-Viable, Exudate, Fibrin/Slough, Subcutaneous material debrided: Instrument: Blade Bleeding: Minimum Hemostasis Achieved: Pressure End Time: 13:22 Procedural Pain: 0 Post Procedural Pain: 0 Response to Treatment: Procedure was tolerated well Post Debridement  Measurements of Total Wound Length: (cm) 0.3 Width: (cm) 0.3 Depth: (cm) 0.3 Volume: (cm) 0.021 Character of Wound/Ulcer Post Stable Debridement: Severity of Tissue Post Debridement: Limited to breakdown of skin Post Procedure Diagnosis Same as Pre-procedure Electronic Signature(s) Signed: 08/12/2016 4:32:31 PM By: Jordan Najjarobson, Jordan Feldt Hopkins [name], Jordan J. ([id number]) Signed: 08/13/2016 4:42:28 PM By: [name] Previous Signature: 08/11/2016 4:22:14 PM Version By: [name] Entered By: Jordan Najjarobson, Antrone Walla on 08/12/2016 07:48:16 [name], [name] ([id number]) -------------------------------------------------------------------------------- HPI Details [name], Jordan J. Date of Service: 08/11/2016 12:45 PM Medical Record Patient Account Number: [id number] Number: Treating RN: [name] [birth year] (82 y.o. Other Clinician: Date of Birth/Sex: Female) Treating [name] Primary Care Physician/Extender: [name] Physician: Referring Physician: [name], Hopkins Weeks in Treatment: 8 History of Present Illness HPI Description: 06/10/16; this is an 82 year old woman who [place] near [place] family members. She saw her primary doctor roughly over a week ago and was referred to the ER for a cellulitis in the right medial leg. She was given a round of IV antibiotics in the emergency room and discharged on oral Keflex which she is taking. She is been left with a uncomfortable leg area on the right medial lower leg. Her ABIs in this clinic were 0.8 on the right 0.6 on the left. She does not describe claudication. She is not a diabetic. Lab work in the ER showed a normal BUN and creatinine on 6/20 at 18 and 0.92 respectively white count was 10.1 hemoglobin at 13.8. She did not have any imaging studies. I can see no vascular evaluations in Epic. She does not have a prior wound history 06/17/16; the area affected on her right medial ankle is an area I  thought was due to tissue damage from cellulitis last week. She is going for venous reflux studies this afternoon, these were not ordered in this clinic at least not by me. The patient does not give a prior history of damage to the skin in this area although I wonder if she  actually might not of noticed it. We have been using's Aquacel Ag under a wrap although we will not be able to wrap her today 06/24/16: pt reports today she hasn't heard from the vascular clinic regarding proceeding with arterial studies. our office will assist with this today. she denies s/s of infection. denies problems with her wraps. 07/07/16; since I last saw this woman she was admitted to hospital from 7/16 through 7/18 with sepsis syndrome felt to be secondary to her ulcer. She was treated with Vanco and Zosyn the hospital discharged on Septra. Blood cultures were negative. Urine culture showed multiple species she is now feeling well using Santyl with border foam to her wound. [facility] 07/14/16; no major improvement here. She has a quarter-sized wound covered with thick adherent slough on the lateral aspect of her right leg with several surrounding satellite lesions in a similar state. She has had 2 recent bouts of cellulitis, one before she came to this clinic and one required a recent hospitalization. I have asked for arterial studies I don't believe these have ever been done. My notes suggest from early in July that she went for reflux studies although I don't know that I have ever seen these either, she did have a DVT rule out but I did not order this, this did not show a DVT but did show a Baker's cyst 07/21/2016 -- the patient had an arterial duplex study done with waveform suggestive of right femoropopliteal disease. There is also small vessel disease bilaterally. Right ABI was in the normal range of 1.0 and a TBI was 0.74. Left ABI is abnormal at 0.69 with a TBI of 0.40. Three-vessel runoff on the  right and a two-vessel runoff on the left. She has an appointment to see Dr. Kirke Corin later today. 07/28/2016 -- was seen by Dr. Kirke Corin who reviewed her arterial studies with an ABI of 0.94 on the right and Marasigan, Elfreida J. ([id number]) 0.69 on the left with normal right toe brachial index. Duplex showed diffuse atherosclerosis without discrete stenosis and there was a three-vessel runoff on the right below the knee. Based on these studies and the location of the ulcer he thought it was venous commended she continue with wound care consults. 08/04/16; I note the patient's reasonably functional arterial supply which is fortunate. The wound bed appears to be smaller. Debrided of surface slough. She is been using Iodoflex 08/11/16: Only a very small open area remains. However it has some depth Electronic Signature(s) Signed: 08/12/2016 4:32:31 PM By: Jordan Najjar Hopkins Entered By: Jordan Najjar on 08/12/2016 07:49:13 Jicha, [name] -------------------------------------------------------------------------------- Physical Exam Details [name], ABID. Date of Service: 08/11/2016 12:45 PM Medical Record Patient Account Number: [id number] Number: Treating RN: Jordan Haggis Nov 10, 1931 (82 y.o. Other Clinician: Date of Birth/Sex: Female) Treating Yasamin Karel Primary Care Physician/Extender: Jordan Libra, Hopkins Physician: Referring Physician: Guy Begin Weeks in Treatment: 8 Constitutional Sitting or standing Blood Pressure is within target range for patient.. Pulse regular and within target range for patient.Marland Kitchen Respirations regular, non-labored and within target range.. Temperature is normal and within the target range for the patient.. Notes Wound exam; wound is debrided to the base of skin and slough. Still has some depth to this debridement cleans up the base of this hopefully this will fill-in Electronic Signature(s) Signed: 08/12/2016 4:32:31 PM  By: Jordan Najjar Hopkins Entered By: Jordan Najjar on 08/12/2016 07:49:57 Deberry, Jordan Rhyme ([id number]) -------------------------------------------------------------------------------- Physician Orders Details [name],  Jordan J. Date of Service: 08/11/2016 12:45 PM Medical Record Patient Account Number: [id number] Number: Treating RN: Jordan Haggis Mar 19, 1931 (82 y.o. Other Clinician: Date of Birth/Sex: Female) Treating Alcides Nutting Primary Care Physician/Extender: Jordan Libra, Hopkins Physician: Referring Physician: [name] in Treatment: 8 Verbal / Phone Orders: Yes Clinician: Ashok Cordia, Hopkins Read Back and Verified: Yes Diagnosis Coding Wound Cleansing Wound #1 Right,Medial Lower Leg o Cleanse wound with mild soap and water Skin Barriers/Peri-Wound Care Wound #1 Right,Medial Lower Leg o Moisturizing lotion - to dry skin Primary Wound Dressing Wound #1 Right,Medial Lower Leg o Prisma Ag - cut to fit the wound and moisten with saline Secondary Dressing Wound #1 Right,Medial Lower Leg o ABD pad o Dry Gauze Dressing Change Frequency Wound #1 Right,Medial Lower Leg o Change dressing every week Follow-up Appointments Wound #1 Right,Medial Lower Leg o Return Appointment in 1 week. Edema Control Wound #1 Right,Medial Lower Leg o 3 Layer Compression System - Right Lower Extremity - 3cm from toes to 3cm from knee anchor with unna o Elevate legs to the level of the heart and pump ankles as often as possible Additional Orders / Instructions [name], RESSLER. ([id number]) Wound #1 Right,Medial Lower Leg o Increase protein intake. o Activity as tolerated Home Health Wound #1 Right,Medial Lower Leg o D/C Home Health Services [facility] you may discharge pt. She only needs her dressing changed once a week and it will be changed at the [facility]. Electronic Signature(s) Signed: 08/11/2016 4:22:14 PM By: Jordan Mulling Signed: 08/12/2016 4:32:31 PM By: Jordan Najjar Hopkins Entered By: Jordan Mulling on 08/11/2016 14:53:35 Romanello, [name] -------------------------------------------------------------------------------- Problem List Details [name], ROA. Date of Service: 08/11/2016 12:45 PM Medical Record Patient Account Number: [id number] Number: Treating RN: Jordan Haggis 06-22-1931 (82 y.o. 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 Date of Service: 08/11/2016 12:45 PM Medical Record Patient Account Number: [id number] Number: Treating RN: Jordan Haggis Nov 20, 1931 (82 y.o. Other Clinician: Date of Birth/Sex: Female) Treating Airlie Blumenberg Primary Care Physician/Extender: Jordan Libra, Hopkins Physician: Referring Physician: Guy Begin Weeks in Treatment: 8 Subjective Chief Complaint Information obtained from Patient 06/10/16; the patient is here for review of a wound on the right anterior medial lower leg above the ankle History of Present Illness (HPI) 06/10/16; this is an 82 year old woman who lives near [place] with family members. She saw her  primary doctor roughly over a week ago and was referred to the ER for a cellulitis in the right medial leg. She was given a round of IV antibiotics in the emergency room and discharged on oral Keflex which she is taking. She is been left with a uncomfortable leg area on the right medial lower leg. Her ABIs in this clinic were 0.8 on the right 0.6 on the left. She does not describe claudication. She is not a diabetic. Lab work in the ER showed a normal BUN and  creatinine on 6/20 at 18 and 0.92 respectively white count was 10.1 hemoglobin at 13.8. She did not have any imaging studies. I can see no vascular evaluations in Epic. She does not have a prior wound history 06/17/16; the area affected on her right medial ankle is an area I thought was due to tissue damage from cellulitis last week. She is going for venous reflux studies this afternoon, these were not ordered in this clinic at least not by me. The patient does not give a prior history of damage to the skin in this area although I wonder if she actually might not of noticed it. We have been using's Aquacel Ag under a wrap although we will not be able to wrap her today 06/24/16: pt reports today she hasn't heard from the vascular clinic regarding proceeding with arterial studies. our office will assist with this today. she denies s/s of infection. denies problems with her wraps. 07/07/16; since I last saw this woman she was admitted to hospital from 7/16 through 7/18 with sepsis syndrome felt to be secondary to her ulcer. She was treated with Vanco and Zosyn the hospital discharged on Septra. Blood cultures were negative. Urine culture showed multiple species she is now feeling well using Santyl with border foam to her wound. [facility] 07/14/16; no major improvement here. She has a quarter-sized wound covered with thick adherent slough on the lateral aspect of her right leg with several surrounding satellite lesions in a similar  state. She has had 2 recent bouts of cellulitis, one before she came to this clinic and one required a recent hospitalization. I have asked for arterial studies I don't believe these have ever been done. My notes suggest from early in July that she went for reflux studies although I don't know that I have ever seen these either, she did have a DVT rule out but I did not order this, this did not show a DVT but did show a Baker's cyst 07/21/2016 -- the patient had an arterial duplex study done with waveform suggestive of right femoropopliteal [name], Jordan J. ([id number]) disease. There is also small vessel disease bilaterally. Right ABI was in the normal range of 1.0 and a TBI was 0.74. Left ABI is abnormal at 0.69 with a TBI of 0.40. Three-vessel runoff on the right and a two-vessel runoff on the left. She has an appointment to see Dr. Kirke Corin later today. 07/28/2016 -- was seen by Dr. Kirke Corin who reviewed her arterial studies with an ABI of 0.94 on the right and 0.69 on the left with normal right toe brachial index. Duplex showed diffuse atherosclerosis without discrete stenosis and there was a three-vessel runoff on the right below the knee. Based on these studies and the location of the ulcer he thought it was venous commended she continue with wound care consults. 08/04/16; I note the patient's reasonably functional arterial supply which is fortunate. The wound bed appears to be smaller. Debrided of surface slough. She is been using Iodoflex 08/11/16: Only a very small open area remains. However it has some depth Objective Constitutional Sitting or standing Blood Pressure is within target range for patient.. Pulse regular and within target range for patient.Marland Kitchen Respirations regular, non-labored and within target range.. Temperature is normal and within the target range for the patient.. Vitals Time Taken: 12:53 PM, Height: 61 in, Weight: 170 lbs, BMI: 32.1, Temperature: 97.6 F, Pulse: 73 bpm,  Respiratory Rate: 18 breaths/min, Blood Pressure: 138/73 mmHg. General Notes:  Wound exam; wound is debrided to the base of skin and slough. Still has some depth to this debridement cleans up the base of this hopefully this will fill-in Integumentary (Hair, Skin) Wound #1 status is Open. Original cause of wound was Gradually Appeared. The wound is located on the Right,Medial Lower Leg. The wound measures 0.3cm length x 0.3cm width x 0.2cm depth; 0.071cm^2 area and 0.014cm^3 volume. The wound is limited to skin breakdown. There is no tunneling or undermining noted. There is a large amount of serosanguineous drainage noted. The wound margin is indistinct and nonvisible. There is large (67-100%) red, pink granulation within the wound bed. There is a small (1-33%) amount of necrotic tissue within the wound bed including Adherent Slough. The periwound skin appearance exhibited: Scarring, Dry/Scaly, Hemosiderin Staining. The periwound skin appearance did not exhibit: Callus, Crepitus, Excoriation, Fluctuance, Friable, Induration, Localized Edema, Rash, Maceration, Moist, Atrophie Blanche, Cyanosis, Ecchymosis, Mottled, Pallor, Rubor, Erythema. Periwound temperature was noted as Cool/Cold. The periwound has tenderness on palpation. Assessment CLORA, OHMER ([id number]) Active Problems ICD-10 [phone] - Non-pressure chronic ulcer of right calf limited to breakdown of skin L03.115 - Cellulitis of right lower limb I70.232 - Atherosclerosis of native arteries of right leg with ulceration of calf Procedures Wound #1 Wound #1 is a Venous Leg Ulcer located on the Right,Medial Lower Leg . There was a Skin/Subcutaneous Tissue Debridement ([id number]) debridement with total area of 0.09 sq cm performed by Jordan Caul, Hopkins. with the following instrument(s): Blade to remove Viable and Non-Viable tissue/material including Exudate, Fibrin/Slough, and Subcutaneous after achieving pain control using Other  (lidocaine 4% cream). A time out was conducted at 13:21, prior to the start of the procedure. A Minimum amount of bleeding was controlled with Pressure. The procedure was tolerated well with a pain level of 0 throughout and a pain level of 0 following the procedure. Post Debridement Measurements: 0.3cm length x 0.3cm width x 0.3cm depth; 0.021cm^3 volume. Character of Wound/Ulcer Post Debridement is stable. Severity of Tissue Post Debridement is: Limited to breakdown of skin. Post procedure Diagnosis Wound #1: Same as Pre-Procedure Plan Wound Cleansing: Wound #1 Right,Medial Lower Leg: Cleanse wound with mild soap and water Skin Barriers/Peri-Wound Care: Wound #1 Right,Medial Lower Leg: Moisturizing lotion - to dry skin Primary Wound Dressing: Wound #1 Right,Medial Lower Leg: Prisma Ag - cut to fit the wound and moisten with saline Secondary Dressing: Wound #1 Right,Medial Lower Leg: ABD pad Dry Gauze Dressing Change Frequency: Wound #1 Right,Medial Lower Leg: Change dressing every week SALEEN, PEDEN. ([id number]) Follow-up Appointments: Wound #1 Right,Medial Lower Leg: Return Appointment in 1 week. Edema Control: Wound #1 Right,Medial Lower Leg: 3 Layer Compression System - Right Lower Extremity - 3cm from toes to 3cm from knee anchor with unna Elevate legs to the level of the heart and pump ankles as often as possible Additional Orders / Instructions: Wound #1 Right,Medial Lower Leg: Increase protein intake. Activity as tolerated Home Health: Wound #1 Right,Medial Lower Leg: D/C Home Health Services [facility] you may discharge pt. She only needs her dressing changed once a week and it will be changed at the Wound Care Center. prisma, profore lite Electronic Signature(s) Signed: 08/12/2016 4:32:31 PM By: Jordan Najjar Hopkins Entered By: Jordan Najjar on 08/12/2016 07:50:40 Enberg, Jordan Rhyme  ([id number]) -------------------------------------------------------------------------------- SuperBill Details Patient Name: Jordan Banda Date of Service: 08/11/2016 Medical Record Patient Account Number: [id number] Number: Treating RN: Jordan Haggis [birth year] (82 y.o. 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

## 2016-08-18 ENCOUNTER — Encounter: Payer: Medicare Other | Attending: Internal Medicine | Admitting: Internal Medicine

## 2016-08-18 DIAGNOSIS — L97211 Non-pressure chronic ulcer of right calf limited to breakdown of skin: Secondary | ICD-10-CM | POA: Insufficient documentation

## 2016-08-18 DIAGNOSIS — I10 Essential (primary) hypertension: Secondary | ICD-10-CM | POA: Diagnosis not present

## 2016-08-18 DIAGNOSIS — I70232 Atherosclerosis of native arteries of right leg with ulceration of calf: Secondary | ICD-10-CM | POA: Insufficient documentation

## 2016-08-18 DIAGNOSIS — M1990 Unspecified osteoarthritis, unspecified site: Secondary | ICD-10-CM | POA: Insufficient documentation

## 2016-08-18 DIAGNOSIS — L03115 Cellulitis of right lower limb: Secondary | ICD-10-CM | POA: Insufficient documentation

## 2016-08-19 NOTE — Progress Notes (Signed)
[name] ([id number]) Visit Report for 08/18/2016 Arrival Information Details [name], [name]. Date of Service: 08/18/2016 1:30 PM Medical Record Patient Account Number: [id number] Number: Treating RN: [name] 1931-02-07 (82 y.o. Other Clinician: Date of Birth/Sex: Female) Treating ROBSON, MICHAEL Primary Care Physician: Guy Begin Physician/Extender: G Referring Physician: [name] in Treatment: 9 Visit Information History Since Last Visit All ordered tests and consults were completed: No Patient Arrived: Cane Added or deleted any medications: No Arrival Time: 13:20 Any new allergies or adverse reactions: No Accompanied By: self Had a fall or experienced change in No Transfer Assistance: None activities of daily living that may affect Patient Identification Verified: Yes risk of falls: Secondary Verification Process Completed: Yes Signs or symptoms of abuse/neglect since last No Patient Requires Transmission-Based No visito Precautions: Hospitalized since last visit: No Patient Has Alerts: No Pain Present Now: No Electronic Signature(s) Signed: 08/18/2016 4:41:03 PM By: [name] Entered By: [name] on 08/18/2016 13:21:11 Marques, [name] ([id number]) -------------------------------------------------------------------------------- Encounter Discharge Information Details [name] Date of Service: 08/18/2016 1:30 PM Medical Record Patient Account Number: [id number] Number: Treating RN: [name] February 10, 1931 (82 y.o. Other Clinician: Date of Birth/Sex: Female) Treating ROBSON, MICHAEL Primary Care Physician: Guy Begin Physician/Extender: G Referring Physician: [name] in Treatment: 9 Encounter Discharge Information Items Discharge Pain Level: 0 Discharge Condition: Stable Ambulatory Status: Cane Discharge Destination: Home Transportation: Private  Auto Accompanied By: self Schedule Follow-up Appointment: Yes Medication Reconciliation completed Yes and provided to Patient/Care Jette Lewan: Provided on Clinical Summary of Care: 08/18/2016 Form Type Recipient Paper Patient CF Electronic Signature(s) Signed: 08/18/2016 1:57:00 PM By: Gwenlyn Perking Entered By: Gwenlyn Perking on 08/18/2016 13:57:00 Uber, [name] ([id number]) -------------------------------------------------------------------------------- Lower Extremity Assessment Details [name] Date of Service: 08/18/2016 1:30 PM Medical Record Patient Account Number: [id number] Number: Treating RN: [name] May 21, 1931 (82 y.o. Other Clinician: Date of Birth/Sex: Female) Treating ROBSON, MICHAEL Primary Care Physician: Guy Begin Physician/Extender: G Referring Physician: Guy Begin Weeks in Treatment: 9 Edema Assessment Assessed: [Left: No] [Right: No] E[Left: dema] [Right: :] Calf Left: Right: Point of Measurement: 32 cm From Medial Instep cm 32.5 cm Ankle Left: Right: Point of Measurement: 10 cm From Medial Instep cm 22 cm Vascular Assessment Pulses: Posterior Tibial Dorsalis Pedis Palpable: [Right:Yes] Extremity colors, hair growth, and conditions: Extremity Color: [Right:Hyperpigmented] Temperature of Extremity: [Right:Warm] Capillary Refill: [Right:< 3 seconds] Toe Nail Assessment Left: Right: Thick: No Discolored: Yes Deformed: No Improper Length and Hygiene: No Notes heel to knee- 46.5cm Electronic Signature(s) Signed: 08/18/2016 4:41:03 PM By: Nada Boozer, [name] ([id number]) Entered By: [name] on 08/18/2016 16:26:02 Lierman, [name] ([id number]) -------------------------------------------------------------------------------- Multi Wound Chart Details [name] Date of Service: 08/18/2016 1:30 PM Medical Record Patient Account Number:  [id number] Number: Treating RN: [name] Jan 03, 1931 (82 y.o. Other Clinician: Date of Birth/Sex: Female) Treating ROBSON, MICHAEL Primary Care Physician: Guy Begin Physician/Extender: G Referring Physician: Guy Begin Weeks in Treatment: 9 Wound Assessments Treatment Notes Electronic Signature(s) Signed: 08/18/2016 4:41:03 PM By: [name] Entered By: [name] on 08/18/2016 13:34:41 Koehne, [name] ([id number]) -------------------------------------------------------------------------------- Multi-Disciplinary Care Plan Details [name] Date of Service: 08/18/2016 1:30 PM Medical Record Patient Account Number: [id number] Number: Treating RN: [name] June 10, 1931 (82 y.o. Other Clinician: Date of Birth/Sex: Female) Treating ROBSON, MICHAEL Primary Care Physician: Guy Begin Physician/Extender: G Referring Physician: Guy Begin Weeks in Treatment: 9  Active Inactive Orientation to the Wound Care Program Nursing Diagnoses: Knowledge deficit related to the wound healing center program Goals: Patient/caregiver will verbalize understanding of the Wound Healing Center Program Date Initiated: 06/10/2016 Goal Status: Active Interventions: Provide education on orientation to the wound center Notes: Venous Leg Ulcer Nursing Diagnoses: Potential for venous Insuffiency (use before diagnosis confirmed) Goals: Non-invasive venous studies are completed as ordered Date Initiated: 06/10/2016 Goal Status: Active Patient will maintain optimal edema control Date Initiated: 06/10/2016 Goal Status: Active Patient/caregiver will verbalize understanding of disease process and disease management Date Initiated: 06/10/2016 Goal Status: Active Verify adequate tissue perfusion prior to therapeutic compression application Date Initiated: 06/10/2016 Goal Status: Active Interventions: KIT, BRUBACHER.  ([id number]) Assess peripheral edema status every visit. Compression as ordered Provide education on venous insufficiency Treatment Activities: Non-invasive vascular studies : 06/10/2016 Notes: Wound/Skin Impairment Nursing Diagnoses: Impaired tissue integrity Knowledge deficit related to ulceration/compromised skin integrity Goals: Patient/caregiver will verbalize understanding of skin care regimen Date Initiated: 06/10/2016 Goal Status: Active Ulcer/skin breakdown will have a volume reduction of 30% by week 4 Date Initiated: 06/10/2016 Goal Status: Active Ulcer/skin breakdown will have a volume reduction of 50% by week 8 Date Initiated: 06/10/2016 Goal Status: Active Ulcer/skin breakdown will have a volume reduction of 80% by week 12 Date Initiated: 06/10/2016 Goal Status: Active Ulcer/skin breakdown will heal within 14 weeks Date Initiated: 06/10/2016 Goal Status: Active Interventions: Assess patient/caregiver ability to obtain necessary supplies Assess patient/caregiver ability to perform ulcer/skin care regimen upon admission and as needed Assess ulceration(s) every visit Provide education on ulcer and skin care Treatment Activities: Skin care regimen initiated : 06/10/2016 Topical wound management initiated : 06/10/2016 Notes: Electronic Signature(s) KINETA, FUDALA ([id number]) Signed: 08/18/2016 4:41:03 PM By: [name] Entered By: [name] on 08/18/2016 13:34:34 Manigo, Fiona Shela Commons ([id number]) -------------------------------------------------------------------------------- Pain Assessment Details [name] Date of Service: 08/18/2016 1:30 PM Medical Record Patient Account Number: [id number] Number: Treating RN: [name] 05-04-1931 (82 y.o. Other Clinician: Date of Birth/Sex: Female) Treating ROBSON, MICHAEL Primary Care Physician: Guy Begin Physician/Extender: G Referring Physician: Guy Begin Weeks in  Treatment: 9 Active Problems Location of Pain Severity and Description of Pain Patient Has Paino No Site Locations With Dressing Change: No Pain Management and Medication Current Pain Management: Electronic Signature(s) Signed: 08/18/2016 4:41:03 PM By: [name] Entered By: [name] on 08/18/2016 13:21:19 Sine, [name] ([id number]) -------------------------------------------------------------------------------- Patient/Caregiver Education Details [name] Date of Service: 08/18/2016 1:30 PM Medical Record Patient Account Number: [id number] Number: Treating RN: [name] [birth year] (82 y.o. Other Clinician: Date of Birth/Gender: Female) Treating ROBSON, MICHAEL Primary Care Physician: Guy Begin Physician/Extender: G Referring Physician: [name] in Treatment: 9 Education Assessment Education Provided To: Patient Education Topics Provided Wound/Skin Impairment: Handouts: Other: keep wrap dry Methods: Demonstration, Explain/Verbal Responses: State content correctly Electronic Signature(s) Signed: 08/18/2016 4:41:03 PM By: [name] Entered By: [name] on 08/18/2016 13:39:38 Vlachos, Arvilla Shela Commons ([id number]) -------------------------------------------------------------------------------- Wound Assessment Details [name] Date of Service: 08/18/2016 1:30 PM Medical Record Patient Account Number: [id number] Number: Treating RN: [name] 09/24/1931 (82 y.o. Other Clinician: Date of Birth/Sex: Female) Treating ROBSON, MICHAEL Primary Care Physician: Guy Begin Physician/Extender: G Referring Physician: Guy Begin Weeks in Treatment: 9 Wound Status Wound Number: 1 Primary Venous Leg Ulcer Etiology: Wound Location: Right, Medial Lower Leg Wound Status: Open Wounding Event: Gradually Appeared Comorbid Cataracts, Arrhythmia, Hypertension, Date  Acquired: 06/01/2016 History: Osteoarthritis Weeks Of Treatment:  9 Clustered Wound: No Photos Photo Uploaded By: Alejandro MullingPinkerton, Debra on 08/18/2016 16:15:00 Wound Measurements Length: (cm) 0.2 Width: (cm) 0.1 Depth: (cm) 0.2 Area: (cm) 0.016 Volume: (cm) 0.003 % Reduction in Area: 99.9% % Reduction in Volume: 99.9% Epithelialization: Medium (34-66%) Tunneling: No Undermining: No Wound Description Classification: Partial Thickness Foul Odor Afte Wound Margin: Indistinct, nonvisible Exudate Amount: Large Exudate Type: Serous Exudate Color: amber r Cleansing: No Wound Bed Granulation Amount: Large (67-100%) Exposed Structure Granulation Quality: Red, Pink Fascia Exposed: No Necrotic Amount: None Present (0%) Fat Layer Exposed: No Kohan, Kiaria J. ([id number]) Tendon Exposed: No Muscle Exposed: No Joint Exposed: No Bone Exposed: No Limited to Skin Breakdown Periwound Skin Texture Texture Color No Abnormalities Noted: No No Abnormalities Noted: No Callus: No Atrophie Blanche: No Crepitus: No Cyanosis: No Excoriation: No Ecchymosis: No Fluctuance: No Erythema: No Friable: No Hemosiderin Staining: Yes Induration: No Mottled: No Localized Edema: No Pallor: No Rash: No Rubor: No Scarring: Yes Temperature / Pain Moisture Temperature: Cool/Cold No Abnormalities Noted: No Tenderness on Palpation: Yes Dry / Scaly: Yes Maceration: No Moist: No Wound Preparation Ulcer Cleansing: Rinsed/Irrigated with Saline Topical Anesthetic Applied: Other: Lidocaine 4%, Treatment Notes Wound #1 (Right, Medial Lower Leg) 1. Cleansed with: Cleanse wound with antibacterial soap and water 2. Anesthetic Topical Lidocaine 4% cream to wound bed prior to debridement 4. Dressing Applied: Iodosorb Ointment 5. Secondary Dressing Applied ABD Pad Dry Gauze 7. Secured with Tape 3 Layer Compression System - Right Lower Extremity Notes unna to anchor Electronic  Signature(s) Jordan BandaFULLER, Sheryn J. ([id number]) Signed: 08/18/2016 4:41:03 PM By: Alejandro MullingPinkerton, Debra Entered By: Alejandro MullingPinkerton, Debra on 08/18/2016 13:37:20 Delellis, [name] ([id number]) -------------------------------------------------------------------------------- Vitals Details Patient Name: Jordan BandaFULLER, Jazzman J. Date of Service: 08/18/2016 1:30 PM Medical Record Patient Account Number: [id number] Number: Treating RN: Phillis Haggisinkerton, Debi [birth year] (82 y.o. Other Clinician: Date of Birth/Sex: Female) Treating ROBSON, MICHAEL Primary Care Physician: [name], NARGES Physician/Extender: G Referring Physician: [name], NARGES Weeks in Treatment: 9 Vital Signs Time Taken: 13:34 Temperature (F): 97.4 Height (in): 61 Pulse (bpm): 74 Weight (lbs): 170 Respiratory Rate (breaths/min): 18 Body Mass Index (BMI): 32.1 Blood Pressure (mmHg): 140/72 Reference Range: 80 - 120 mg / dl Electronic Signature(s) Signed: 08/18/2016 4:41:03 PM By: Alejandro MullingPinkerton, Debra Entered By: Alejandro MullingPinkerton, Debra on 08/18/2016 13:36:41

## 2016-08-19 NOTE — Progress Notes (Signed)
MALETA, PACHA ([id number]) Visit Report for 08/18/2016 Chief Complaint Document Details [name], [name]. Date of Service: 08/18/2016 1:30 PM Medical Record Patient Account Number: [id number] Number: Treating RN: [name] 05/30/1931 (82 y.o. Other Clinician: Date of Birth/Sex: Female) Treating ROBSON, MICHAEL Primary Care Physician/Extender: Letitia Libra, NARGES Physician: Referring Physician: Guy Begin Weeks in Treatment: 9 Information Obtained from: Patient Chief Complaint 06/10/16; the patient is here for review of a wound on the right anterior medial lower leg above the ankle Electronic Signature(s) Signed: 08/19/2016 7:55:14 AM By: Baltazar Najjar MD Entered By: Baltazar Najjar on 08/18/2016 13:52:44 [name], [name] ([id number]) -------------------------------------------------------------------------------- HPI Details [name] Date of Service: 08/18/2016 1:30 PM Medical Record Patient Account Number: [id number] Number: Treating RN: [name] 1931-05-12 (82 y.o. Other Clinician: Date of Birth/Sex: Female) Treating ROBSON, MICHAEL Primary Care Physician/Extender: Letitia Libra, NARGES Physician: Referring Physician: Guy Begin Weeks in Treatment: 9 History of Present Illness HPI Description: 06/10/16; this is an 82 year old woman who lives near [place] with family members. She saw her primary doctor roughly over a week ago and was referred to the ER for a cellulitis in the right medial leg. She was given a round of IV antibiotics in the emergency room and discharged on oral Keflex which she is taking. She is been left with a uncomfortable leg area on the right medial lower leg. Her ABIs in this clinic were 0.8 on the right 0.6 on the left. She does not describe claudication. She is not a diabetic. Lab work in the ER showed a normal BUN and creatinine on 6/20 at 18 and 0.92 respectively white count was 10.1  hemoglobin at 13.8. She did not have any imaging studies. I can see no vascular evaluations in Epic. She does not have a prior wound history 06/17/16; the area affected on her right medial ankle is an area I thought was due to tissue damage from cellulitis last week. She is going for venous reflux studies this afternoon, these were not ordered in this clinic at least not by me. The patient does not give a prior history of damage to the skin in this area although I wonder if she actually might not of noticed it. We have been using's Aquacel Ag under a wrap although we will not be able to wrap her today 06/24/16: pt reports today she hasn't heard from the vascular clinic regarding proceeding with arterial studies. our office will assist with this today. she denies s/s of infection. denies problems with her wraps. 07/07/16; since I last saw this woman she was admitted to hospital from 7/16 through 7/18 with sepsis syndrome felt to be secondary to her ulcer. She was treated with Vanco and Zosyn the hospital discharged on Septra. Blood cultures were negative. Urine culture showed multiple species she is now feeling well using Santyl with border foam to her wound. [facility] 07/14/16; no major improvement here. She has a quarter-sized wound covered with thick adherent slough on the lateral aspect of her right leg with several surrounding satellite lesions in a similar state. She has had 2 recent bouts of cellulitis, one before she came to this clinic and one required a recent hospitalization. I have asked for arterial studies I don't believe these have ever been done. My notes suggest from early in July that she went for reflux studies although I don't know that I have ever seen these either, she did have a DVT rule out  but I did not order this, this did not show a DVT but did show a Baker's cyst 07/21/2016 -- the patient had an arterial duplex study done with waveform suggestive of right  femoropopliteal disease. There is also small vessel disease bilaterally. Right ABI was in the normal range of 1.0 and a TBI was 0.74. Left ABI is abnormal at 0.69 with a TBI of 0.40. Three-vessel runoff on the right and a two-vessel runoff on the left. She has an appointment to see Dr. Kirke Corin later today. 07/28/2016 -- was seen by Dr. Kirke Corin who reviewed her arterial studies with an ABI of 0.94 on the right and Hopkins, Jordan J. ([id number]) 0.69 on the left with normal right toe brachial index. Duplex showed diffuse atherosclerosis without discrete stenosis and there was a three-vessel runoff on the right below the knee. Based on these studies and the location of the ulcer he thought it was venous commended she continue with wound care consults. 08/04/16; I note the patient's reasonably functional arterial supply which is fortunate. The wound bed appears to be smaller. Debrided of surface slough. She is been using Iodoflex 08/11/16: Only a very small open area remains. However it has some depth 08/18/16; small open area is smaller. Still open however. Electronic Signature(s) Signed: 08/19/2016 7:55:14 AM By: Baltazar Najjar MD Entered By: Baltazar Najjar on 08/18/2016 13:53:28 [name], [name] ([id number]) -------------------------------------------------------------------------------- Physical Exam Details [name], [name]. Date of Service: 08/18/2016 1:30 PM Medical Record Patient Account Number: [id number] Number: Treating RN: [name] 07-09-1931 (82 y.o. Other Clinician: Date of Birth/Sex: Female) Treating ROBSON, MICHAEL Primary Care Physician/Extender: Letitia Libra, NARGES Physician: Referring Physician: Guy Begin Weeks in Treatment: 9 Constitutional Sitting or standing Blood Pressure is within target range for patient.. Pulse regular and within target range for patient.Marland Kitchen Respirations regular, non-labored and within target range.. Temperature is normal and  within the target range for the patient.Marland Kitchen Respiratory Respiratory effort is easy and symmetric bilaterally. Rate is normal at rest and on room air.. Cardiovascular Pedal pulses palpable and strong bilaterally.. Chronic venous insufficiency with hemosiderin deposition. Edema is controlled. Lymphatic None palpable in the popliteal or inguinal area. Notes Wound exam; small wound remains still some surface slough and visible drainage. Depth is less than last week Electronic Signature(s) Signed: 08/19/2016 7:55:14 AM By: Baltazar Najjar MD Entered By: Baltazar Najjar on 08/18/2016 13:55:05 [name], [name] ([id number]) -------------------------------------------------------------------------------- Physician Orders Details [name] Date of Service: 08/18/2016 1:30 PM Medical Record Patient Account Number: [id number] Number: Treating RN: [name] [birth year] (82 y.o. Other Clinician: Date of Birth/Sex: Female) Treating ROBSON, MICHAEL Primary Care Physician/Extender: Letitia Libra, NARGES Physician: Referring Physician: [name] in Treatment: 9 Verbal / Phone Orders: Yes Clinician: Ashok Cordia, Debi Read Back and Verified: Yes Diagnosis Coding Wound Cleansing Wound #1 Right,Medial Lower Leg o Cleanse wound with mild soap and water Skin Barriers/Peri-Wound Care Wound #1 Right,Medial Lower Leg o Moisturizing lotion - to dry skin Primary Wound Dressing Wound #1 Right,Medial Lower Leg o Iodosorb Ointment Secondary Dressing Wound #1 Right,Medial Lower Leg o ABD pad o Dry Gauze Dressing Change Frequency Wound #1 Right,Medial Lower Leg o Change dressing every week Follow-up Appointments Wound #1 Right,Medial Lower Leg o Return Appointment in 1 week. Edema Control Wound #1 Right,Medial Lower Leg o 3 Layer Compression System - Right Lower Extremity - 3cm from toes to 3cm from knee anchor with unna o Elevate legs to the  level of the heart and pump  ankles as often as possible Additional Orders / Instructions MICHAELYN, WALL. ([id number]) Wound #1 Right,Medial Lower Leg o Increase protein intake. o Activity as tolerated Electronic Signature(s) Signed: 08/18/2016 4:41:03 PM By: [name] Signed: 08/19/2016 7:55:14 AM By: Baltazar Najjar MD Entered By: [name] on 08/18/2016 13:38:24 [name], [name] ([id number]) -------------------------------------------------------------------------------- Problem List Details [name], [name]. Date of Service: 08/18/2016 1:30 PM Medical Record Patient Account Number: [id number] Number: Treating RN: [name] 09/12/1931 (82 y.o. Other Clinician: Date of Birth/Sex: Female) Treating ROBSON, MICHAEL Primary Care Physician/Extender: Letitia Libra, NARGES Physician: Referring Physician: Guy Begin Weeks in Treatment: 9 Active Problems ICD-10 Encounter Code Description Active Date Diagnosis L97.211 Non-pressure chronic ulcer of right calf limited to 06/10/2016 Yes breakdown of skin L03.115 Cellulitis of right lower limb 06/10/2016 Yes I70.232 Atherosclerosis of native arteries of right leg with 06/10/2016 Yes ulceration of calf Inactive Problems Resolved Problems Electronic Signature(s) Signed: 08/19/2016 7:55:14 AM By: Baltazar Najjar MD Entered By: Baltazar Najjar on 08/18/2016 13:50:03 [name], [name] ([id number]) -------------------------------------------------------------------------------- Progress Note Details [name] Date of Service: 08/18/2016 1:30 PM Medical Record Patient Account Number: [id number] Number: Treating RN: [name] [birth year] (82 y.o. Other Clinician: Date of Birth/Sex: Female) Treating ROBSON, MICHAEL Primary Care Physician/Extender: Letitia Libra, NARGES Physician: Referring Physician: Guy Begin Weeks in Treatment: 9 Subjective Chief  Complaint Information obtained from Patient 06/10/16; the patient is here for review of a wound on the right anterior medial lower leg above the ankle History of Present Illness (HPI) 06/10/16; this is an 82 year old woman who lives near [place] with family members. She saw her primary doctor roughly over a week ago and was referred to the ER for a cellulitis in the right medial leg. She was given a round of IV antibiotics in the emergency room and discharged on oral Keflex which she is taking. She is been left with a uncomfortable leg area on the right medial lower leg. Her ABIs in this clinic were 0.8 on the right 0.6 on the left. She does not describe claudication. She is not a diabetic. Lab work in the ER showed a normal BUN and creatinine on 6/20 at 18 and 0.92 respectively white count was 10.1 hemoglobin at 13.8. She did not have any imaging studies. I can see no vascular evaluations in Epic. She does not have a prior wound history 06/17/16; the area affected on her right medial ankle is an area I thought was due to tissue damage from cellulitis last week. She is going for venous reflux studies this afternoon, these were not ordered in this clinic at least not by me. The patient does not give a prior history of damage to the skin in this area although I wonder if she actually might not of noticed it. We have been using's Aquacel Ag under a wrap although we will not be able to wrap her today 06/24/16: pt reports today she hasn't heard from the vascular clinic regarding proceeding with arterial studies. our office will assist with this today. she denies s/s of infection. denies problems with her wraps. 07/07/16; since I last saw this woman she was admitted to hospital from 7/16 through 7/18 with sepsis syndrome felt to be secondary to her ulcer. She was treated with Vanco and Zosyn the hospital discharged on Septra. Blood cultures were negative. Urine culture showed multiple species she is now  feeling well using Santyl with border foam to her wound. [facility] 07/14/16;  no major improvement here. She has a quarter-sized wound covered with thick adherent slough on the lateral aspect of her right leg with several surrounding satellite lesions in a similar state. She has had 2 recent bouts of cellulitis, one before she came to this clinic and one required a recent hospitalization. I have asked for arterial studies I don't believe these have ever been done. My notes suggest from early in July that she went for reflux studies although I don't know that I have ever seen these either, she did have a DVT rule out but I did not order this, this did not show a DVT but did show a Baker's cyst 07/21/2016 -- the patient had an arterial duplex study done with waveform suggestive of right femoropopliteal [name], Jordan J. ([id number]) disease. There is also small vessel disease bilaterally. Right ABI was in the normal range of 1.0 and a TBI was 0.74. Left ABI is abnormal at 0.69 with a TBI of 0.40. Three-vessel runoff on the right and a two-vessel runoff on the left. She has an appointment to see Dr. Kirke CorinArida later today. 07/28/2016 -- was seen by Dr. Kirke CorinArida who reviewed her arterial studies with an ABI of 0.94 on the right and 0.69 on the left with normal right toe brachial index. Duplex showed diffuse atherosclerosis without discrete stenosis and there was a three-vessel runoff on the right below the knee. Based on these studies and the location of the ulcer he thought it was venous commended she continue with wound care consults. 08/04/16; I note the patient's reasonably functional arterial supply which is fortunate. The wound bed appears to be smaller. Debrided of surface slough. She is been using Iodoflex 08/11/16: Only a very small open area remains. However it has some depth 08/18/16; small open area is smaller. Still open however. Objective Constitutional Sitting or standing Blood  Pressure is within target range for patient.. Pulse regular and within target range for patient.Marland Kitchen. Respirations regular, non-labored and within target range.. Temperature is normal and within the target range for the patient.. Vitals Time Taken: 1:34 PM, Height: 61 in, Weight: 170 lbs, BMI: 32.1, Temperature: 97.4 F, Pulse: 74 bpm, Respiratory Rate: 18 breaths/min, Blood Pressure: 140/72 mmHg. Respiratory Respiratory effort is easy and symmetric bilaterally. Rate is normal at rest and on room air.. Cardiovascular Pedal pulses palpable and strong bilaterally.. Chronic venous insufficiency with hemosiderin deposition. Edema is controlled. Lymphatic None palpable in the popliteal or inguinal area. General Notes: Wound exam; small wound remains still some surface slough and visible drainage. Depth is less than last week Integumentary (Hair, Skin) Wound #1 status is Open. Original cause of wound was Gradually Appeared. The wound is located on the Right,Medial Lower Leg. The wound measures 0.2cm length x 0.1cm width x 0.2cm depth; 0.016cm^2 area and 0.003cm^3 volume. The wound is limited to skin breakdown. There is no tunneling or undermining noted. There is a large amount of serous drainage noted. The wound margin is indistinct and nonvisible. There is large (67-100%) red, pink granulation within the wound bed. There is no necrotic tissue within the [name], Jordan J. ([id number]) wound bed. The periwound skin appearance exhibited: Scarring, Dry/Scaly, Hemosiderin Staining. The periwound skin appearance did not exhibit: Callus, Crepitus, Excoriation, Fluctuance, Friable, Induration, Localized Edema, Rash, Maceration, Moist, Atrophie Blanche, Cyanosis, Ecchymosis, Mottled, Pallor, Rubor, Erythema. Periwound temperature was noted as Cool/Cold. The periwound has tenderness on palpation. Assessment Active Problems ICD-10 L97.211 - Non-pressure chronic ulcer of right calf limited to breakdown of  skin  L03.115 - Cellulitis of right lower limb I70.232 - Atherosclerosis of native arteries of right leg with ulceration of calf Plan Wound Cleansing: Wound #1 Right,Medial Lower Leg: Cleanse wound with mild soap and water Skin Barriers/Peri-Wound Care: Wound #1 Right,Medial Lower Leg: Moisturizing lotion - to dry skin Primary Wound Dressing: Wound #1 Right,Medial Lower Leg: Iodosorb Ointment Secondary Dressing: Wound #1 Right,Medial Lower Leg: ABD pad Dry Gauze Dressing Change Frequency: Wound #1 Right,Medial Lower Leg: Change dressing every week Follow-up Appointments: Wound #1 Right,Medial Lower Leg: Return Appointment in 1 week. Edema Control: Wound #1 Right,Medial Lower Leg: 3 Layer Compression System - Right Lower Extremity - 3cm from toes to 3cm from knee anchor with unna Elevate legs to the level of the heart and pump ankles as often as possible Additional Orders / Instructions: Wound #1 Right,Medial Lower Leg: Kevorkian, Bayleigh J. ([id number]) Increase protein intake. Activity as tolerated change to iodosorb ointment under foam Electronic Signature(s) Signed: 08/19/2016 7:55:14 AM By: Baltazar Najjar MD Entered By: Baltazar Najjar on 08/18/2016 13:55:40 [name], [name] ([id number]) -------------------------------------------------------------------------------- SuperBill Details [name] Date of Service: 08/18/2016 Medical Record Patient Account Number: [id number] Number: Treating RN: [name] 03-25-1931 (82 y.o. Other Clinician: Date of Birth/Sex: Female) Treating ROBSON, MICHAEL Primary Care Physician/Extender: Letitia Libra, NARGES Physician: Weeks in Treatment: 9 Referring Physician: Guy Begin Diagnosis Coding ICD-10 Codes Code Description [phone] Non-pressure chronic ulcer of right calf limited to breakdown of skin L03.115 Cellulitis of right lower limb I70.232 Atherosclerosis of native arteries of right leg with  ulceration of calf Facility Procedures CPT4: Description Modifier Quantity Code [id number] (Facility Use Only) [id number] - APPLY MULTLAY COMPRS LWR RT 1 LEG Physician Procedures CPT4 Code Description: [id number] - WC PHYS LEVEL 3 - EST PT ICD-10 Description Diagnosis 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 Modifier: reakdown of ski Quantity: 1 n Electronic Signature(s) Signed: 08/18/2016 4:41:03 PM By: [name] Signed: 08/19/2016 7:55:14 AM By: Baltazar Najjar MD Entered By: [name] on 08/18/2016 16:11:05

## 2016-08-25 ENCOUNTER — Encounter: Payer: Medicare Other | Admitting: Internal Medicine

## 2016-08-25 DIAGNOSIS — I70232 Atherosclerosis of native arteries of right leg with ulceration of calf: Secondary | ICD-10-CM | POA: Diagnosis not present

## 2016-08-25 NOTE — Progress Notes (Signed)
[name], [name] ([id number]) Visit Report for 08/25/2016 Arrival Information Details [name], [name]. Date of Service: 08/25/2016 3:30 PM Medical Record Patient Account Number: [id number] Number: Treating RN: Huel Coventry Apr 10, 1931 (82 y.o. Other Clinician: Date of Birth/Sex: Female) Treating ROBSON, MICHAEL Primary Care Physician: Guy Begin Physician/Extender: G Referring Physician: Guy Begin Weeks in Treatment: 10 Visit Information History Since Last Visit Added or deleted any medications: No Patient Arrived: Ambulatory Any new allergies or adverse reactions: No Arrival Time: 15:00 Had a fall or experienced change in No Accompanied By: self activities of daily living that may affect Transfer Assistance: None risk of falls: Patient Identification Verified: Yes Signs or symptoms of abuse/neglect since last No Secondary Verification Process Yes visito Completed: Hospitalized since last visit: No Patient Requires Transmission-Based No Has Dressing in Place as Prescribed: Yes Precautions: Pain Present Now: No Patient Has Alerts: No Electronic Signature(s) Signed: 08/25/2016 5:05:22 PM By: Elliot Gurney, RN, BSN, Kim RN, BSN Entered By: Elliot Gurney, RN, BSN, Kim on 08/25/2016 15:00:45 [name], [name] ([id number]) -------------------------------------------------------------------------------- Clinic Level of Care Assessment Details [name], [name]. Date of Service: 08/25/2016 3:30 PM Medical Record Patient Account Number: [id number] Number: Treating RN: Huel Coventry 1931/11/28 (82 y.o. Other Clinician: Date of Birth/Sex: Female) Treating ROBSON, MICHAEL Primary Care Physician: Guy Begin Physician/Extender: G Referring Physician: [name] in Treatment: 10 Clinic Level of Care Assessment Items TOOL 4 Quantity Score []  - Use when only an EandM is performed on FOLLOW-UP visit 0 ASSESSMENTS - Nursing Assessment /  Reassessment []  - Reassessment of Co-morbidities (includes updates in patient status) 0 X - Reassessment of Adherence to Treatment Plan 1 5 ASSESSMENTS - Wound and Skin Assessment / Reassessment X - Simple Wound Assessment / Reassessment - one wound 1 5 []  - Complex Wound Assessment / Reassessment - multiple wounds 0 []  - Dermatologic / Skin Assessment (not related to wound area) 0 ASSESSMENTS - Focused Assessment []  - Circumferential Edema Measurements - multi extremities 0 []  - Nutritional Assessment / Counseling / Intervention 0 []  - Lower Extremity Assessment (monofilament, tuning fork, pulses) 0 []  - Peripheral Arterial Disease Assessment (using hand held doppler) 0 ASSESSMENTS - Ostomy and/or Continence Assessment and Care []  - Incontinence Assessment and Management 0 []  - Ostomy Care Assessment and Management (repouching, etc.) 0 PROCESS - Coordination of Care X - Simple Patient / Family Education for ongoing care 1 15 []  - Complex (extensive) Patient / Family Education for ongoing care 0 []  - Staff obtains Chiropractor, Records, Test Results / Process Orders 0 []  - Staff telephones HHA, Nursing Homes / Clarify orders / etc 0 [name], [name] ([id number]) []  - Routine Transfer to another Facility (non-emergent condition) 0 []  - Routine Hospital Admission (non-emergent condition) 0 []  - New Admissions / Manufacturing engineer / Ordering NPWT, Apligraf, etc. 0 []  - Emergency Hospital Admission (emergent condition) 0 X - Simple Discharge Coordination 1 10 []  - Complex (extensive) Discharge Coordination 0 PROCESS - Special Needs []  - Pediatric / Minor Patient Management 0 []  - Isolation Patient Management 0 []  - Hearing / Language / Visual special needs 0 []  - Assessment of Community assistance (transportation, D/C planning, etc.) 0 []  - Additional assistance / Altered mentation 0 []  - Support Surface(s) Assessment (bed, cushion, seat, etc.) 0 INTERVENTIONS - Wound Cleansing /  Measurement []  - Simple Wound Cleansing - one wound 0 []  - Complex Wound Cleansing - multiple wounds 0 X - Wound Imaging (photographs - any number of  wounds) 1 5 []  - Wound Tracing (instead of photographs) 0 X - Simple Wound Measurement - one wound 1 5 []  - Complex Wound Measurement - multiple wounds 0 INTERVENTIONS - Wound Dressings []  - Small Wound Dressing one or multiple wounds 0 []  - Medium Wound Dressing one or multiple wounds 0 []  - Large Wound Dressing one or multiple wounds 0 []  - Application of Medications - topical 0 []  - Application of Medications - injection 0 [name], [name] ([id number]) INTERVENTIONS - Miscellaneous []  - External ear exam 0 []  - Specimen Collection (cultures, biopsies, blood, body fluids, etc.) 0 []  - Specimen(s) / Culture(s) sent or taken to Lab for analysis 0 []  - Patient Transfer (multiple staff / Nurse, adult / Similar devices) 0 []  - Simple Staple / Suture removal (25 or less) 0 []  - Complex Staple / Suture removal (26 or more) 0 []  - Hypo / Hyperglycemic Management (close monitor of Blood Glucose) 0 []  - Ankle / Brachial Index (ABI) - do not check if billed separately 0 X - Vital Signs 1 5 Has the patient been seen at the hospital within the last three years: Yes Total Score: 50 Level Of Care: New/Established - Level 2 Electronic Signature(s) Signed: 08/25/2016 5:05:22 PM By: Elliot Gurney, RN, BSN, Kim RN, BSN Entered By: Elliot Gurney, RN, BSN, Kim on 08/25/2016 15:21:32 [name], [name] ([id number]) -------------------------------------------------------------------------------- Encounter Discharge Information Details [name] Date of Service: 08/25/2016 3:30 PM Medical Record Patient Account Number: [id number] Number: Treating RN: Huel Coventry February 19, 1931 (82 y.o. Other Clinician: Date of Birth/Sex: Female) Treating ROBSON, MICHAEL Primary Care Physician: Guy Begin Physician/Extender: G Referring Physician: [name] in Treatment: 10 Encounter Discharge Information Items Discharge Pain Level: 0 Discharge Condition: Stable Ambulatory Status: Cane Discharge Destination: Home Transportation: Private Auto Accompanied By: self Schedule Follow-up Appointment: Yes Medication Reconciliation completed Yes and provided to Patient/Care Jalynn Waddell: Provided on Clinical Summary of Care: 08/25/2016 Form Type Recipient Paper Patient CF Electronic Signature(s) Signed: 08/25/2016 5:05:22 PM By: Elliot Gurney RN, BSN, Kim RN, BSN Previous Signature: 08/25/2016 3:21:07 PM Version By: Gwenlyn Perking Entered By: Elliot Gurney RN, BSN, Kim on 08/25/2016 15:29:16 Halder, [name] ([id number]) -------------------------------------------------------------------------------- Lower Extremity Assessment Details [name], [name]. Date of Service: 08/25/2016 3:30 PM Medical Record Patient Account Number: [id number] Number: Treating RN: Huel Coventry [birth year] (82 y.o. Other Clinician: Date of Birth/Sex: Female) Treating ROBSON, MICHAEL Primary Care Physician: Guy Begin Physician/Extender: G Referring Physician: Guy Begin Weeks in Treatment: 10 Edema Assessment Assessed: [Left: No] [Right: No] E[Left: dema] [Right: :] Calf Left: Right: Point of Measurement: 32 cm From Medial Instep cm 34 cm Ankle Left: Right: Point of Measurement: 10 cm From Medial Instep cm 23 cm Vascular Assessment Pulses: Posterior Tibial Dorsalis Pedis Doppler: [Right:Multiphasic] Extremity colors, hair growth, and conditions: Extremity Color: [Right:Hyperpigmented] Hair Growth on Extremity: [Right:No] Temperature of Extremity: [Right:Warm] Capillary Refill: [Right:< 3 seconds] Dependent Rubor: [Right:No] Blanched when Elevated: [Right:No] Lipodermatosclerosis: [Right:Yes] Toe Nail Assessment Left: Right: Thick: No Discolored: No Deformed: No Improper Length and Hygiene: No Electronic  Signature(s) [name], [name] ([id number]) Signed: 08/25/2016 5:05:22 PM By: Elliot Gurney, RN, BSN, Kim RN, BSN Entered By: Elliot Gurney, RN, BSN, Kim on 08/25/2016 15:07:08 Grieves, [name] ([id number]) -------------------------------------------------------------------------------- Multi Wound Chart Details [name] Date of Service: 08/25/2016 3:30 PM Medical Record Patient Account Number: [id number] Number: Treating RN: Huel Coventry [birth year] (82 y.o. Other Clinician: Date of Birth/Sex: Female) Treating ROBSON, MICHAEL Primary Care Physician:  [name], NARGES Physician/Extender: G Referring Physician: [name], NARGES Weeks in Treatment: 10 Vital Signs Height(in): 61 Pulse(bpm): 79 Weight(lbs): 170 Blood Pressure 168/80 (mmHg): Body Mass Index(BMI): 32 Temperature(F): 98.1 Respiratory Rate 16 (breaths/min): Photos: [N/A:N/A] Wound Location: Right Lower Leg - Medial N/A N/A Wounding Event: Gradually Appeared N/A N/A Primary Etiology: Venous Leg Ulcer N/A N/A Comorbid History: Cataracts, Arrhythmia, N/A N/A Hypertension, Osteoarthritis Date Acquired: 06/01/2016 N/A N/A Weeks of Treatment: 10 N/A N/A Wound Status: Open N/A N/A Measurements L x W x D 0x0x0 N/A N/A (cm) Area (cm) : 0 N/A N/A Volume (cm) : 0 N/A N/A % Reduction in Area: 100.00% N/A N/A % Reduction in Volume: 100.00% N/A N/A Classification: Partial Thickness N/A N/A Exudate Amount: Large N/A N/A Exudate Type: Serous N/A N/A Exudate Color: amber N/A N/A Wound Margin: Indistinct, nonvisible N/A N/A Granulation Amount: Large (67-100%) N/A N/A Gasca, Jordan J. ([id number]) Granulation Quality: Red, Pink N/A N/A Necrotic Amount: None Present (0%) N/A N/A Exposed Structures: Fascia: No N/A N/A Fat: No Tendon: No Muscle: No Joint: No Bone: No Limited to Skin Breakdown Epithelialization: Medium (34-66%) N/A N/A Periwound Skin Texture: Scarring: Yes N/A N/A Edema: No Excoriation:  No Induration: No Callus: No Crepitus: No Fluctuance: No Friable: No Rash: No Periwound Skin Dry/Scaly: Yes N/A N/A Moisture: Maceration: No Moist: No Periwound Skin Color: Hemosiderin Staining: Yes N/A N/A Atrophie Blanche: No Cyanosis: No Ecchymosis: No Erythema: No Mottled: No Pallor: No Rubor: No Temperature: Cool/Cold N/A N/A Tenderness on Yes N/A N/A Palpation: Wound Preparation: Ulcer Cleansing: N/A N/A Rinsed/Irrigated with Saline Topical Anesthetic Applied: Other: Lidocaine 4% Treatment Notes Electronic Signature(s) Signed: 08/25/2016 5:05:22 PM By: Elliot GurneyWoody, RN, BSN, Kim RN, BSN Entered By: Elliot GurneyWoody, RN, BSN, Kim on 08/25/2016 15:42:04 Treaster, [name] ([id number]) -------------------------------------------------------------------------------- Multi-Disciplinary Care Plan Details Patient Name: Jordan BandaFULLER, Jordan J. Date of Service: 08/25/2016 3:30 PM Medical Record Patient Account Number: [id number] Number: Treating RN: Huel CoventryWoody, Kim 1931/03/31 (82 y.o. Other Clinician: Date of Birth/Sex: Female) Treating ROBSON, MICHAEL Primary Care Physician: [name], NARGES Physician/Extender: G Referring Physician: [name], NARGES Weeks in Treatment: 10 Active Inactive Electronic Signature(s) Signed: 08/25/2016 5:05:22 PM By: Elliot GurneyWoody, RN, BSN, Kim RN, BSN Entered By: Elliot GurneyWoody, RN, BSN, Kim on 08/25/2016 15:20:57 Arena, [name] ([id number]) -------------------------------------------------------------------------------- Pain Assessment Details Patient Name: Jordan BandaFULLER, Skylinn J. Date of Service: 08/25/2016 3:30 PM Medical Record Patient Account Number: [id number] Number: Treating RN: Huel CoventryWoody, Kim 1931/03/31 (82 y.o. Other Clinician: Date of Birth/Sex: Female) Treating ROBSON, MICHAEL Primary Care Physician: [name], NARGES Physician/Extender: G Referring Physician: [name], NARGES Weeks in Treatment: 10 Active Problems Location of Pain Severity and Description of  Pain Patient Has Paino No Site Locations With Dressing Change: No Pain Management and Medication Current Pain Management: Electronic Signature(s) Signed: 08/25/2016 5:05:22 PM By: Elliot GurneyWoody, RN, BSN, Kim RN, BSN Entered By: Elliot GurneyWoody, RN, BSN, Kim on 08/25/2016 15:01:08 Jordan BandaFULLER, Jordan J. ([id number]) -------------------------------------------------------------------------------- Patient/Caregiver Education Details Patient Name: Jordan BandaFULLER, Jordan J. Date of Service: 08/25/2016 3:30 PM Medical Record Patient Account Number: [id number] Number: Treating RN: Huel CoventryWoody, Kim 1931/03/31 (82 y.o. Other Clinician: Date of Birth/Gender: Female) Treating ROBSON, MICHAEL Primary Care Physician: [name], NARGES Physician/Extender: G Referring Physician: Gabriel RungFARAHI, NARGES Weeks in Treatment: 10 Education Assessment Education Provided To: Patient Education Topics Provided Venous: Handouts: Controlling Swelling with Multilayered Compression Wraps Methods: Demonstration Responses: State content correctly Wound/Skin Impairment: Electronic Signature(s) Signed: 08/25/2016 5:05:22 PM By: Elliot GurneyWoody, RN, BSN, Kim RN, BSN Entered By: Elliot GurneyWoody, RN, BSN, Kim on 08/25/2016 15:43:53 Hirst, [name] ([id number]) --------------------------------------------------------------------------------  Wound Assessment Details Patient Name: ANALYCE, [name]. Date of Service: 08/25/2016 3:30 PM Medical Record Patient Account Number: [id number] Number: Treating RN: Huel Coventry 1931-05-15 (82 y.o. Other Clinician: Date of Birth/Sex: Female) Treating ROBSON, MICHAEL Primary Care Physician: Guy Begin Physician/Extender: G Referring Physician: Guy Begin Weeks in Treatment: 10 Wound Status Wound Number: 1 Primary Venous Leg Ulcer Etiology: Wound Location: Right Lower Leg - Medial Wound Status: Open Wounding Event: Gradually Appeared Comorbid Cataracts, Arrhythmia, Hypertension, Date Acquired: 06/01/2016 History:  Osteoarthritis Weeks Of Treatment: 10 Clustered Wound: No Photos Wound Measurements Length: (cm) 0 % Reduction i Width: (cm) 0 % Reduction i Depth: (cm) 0 Epithelializa Area: (cm) 0 Tunneling: Volume: (cm) 0 Undermining: n Area: 100% n Volume: 100% tion: Medium (34-66%) No No Wound Description Classification: Partial Thickness Wound Margin: Indistinct, nonvisible Exudate Amount: Large Exudate Type: Serous Exudate Color: amber Foul Odor After Cleansing: No Wound Bed Granulation Amount: Large (67-100%) Exposed Structure Granulation Quality: Red, Pink Fascia Exposed: No Necrotic Amount: None Present (0%) Fat Layer Exposed: No Avena, Jordan J. ([id number]) Tendon Exposed: No Muscle Exposed: No Joint Exposed: No Bone Exposed: No Limited to Skin Breakdown Periwound Skin Texture Texture Color No Abnormalities Noted: No No Abnormalities Noted: No Callus: No Atrophie Blanche: No Crepitus: No Cyanosis: No Excoriation: No Ecchymosis: No Fluctuance: No Erythema: No Friable: No Hemosiderin Staining: Yes Induration: No Mottled: No Localized Edema: No Pallor: No Rash: No Rubor: No Scarring: Yes Temperature / Pain Moisture Temperature: Cool/Cold No Abnormalities Noted: No Tenderness on Palpation: Yes Dry / Scaly: Yes Maceration: No Moist: No Wound Preparation Ulcer Cleansing: Rinsed/Irrigated with Saline Topical Anesthetic Applied: Other: Lidocaine 4%, Electronic Signature(s) Signed: 08/25/2016 5:05:22 PM By: Elliot Gurney, RN, BSN, Kim RN, BSN Entered By: Elliot Gurney, RN, BSN, Kim on 08/25/2016 15:16:59 [name], [name] ([id number]) -------------------------------------------------------------------------------- Vitals Details [name] Date of Service: 08/25/2016 3:30 PM Medical Record Patient Account Number: [id number] Number: Treating RN: Huel Coventry October 08, 1931 (82 y.o. Other Clinician: Date of Birth/Sex: Female) Treating ROBSON,  MICHAEL Primary Care Physician: Guy Begin Physician/Extender: G Referring Physician: Guy Begin Weeks in Treatment: 10 Vital Signs Time Taken: 15:01 Temperature (F): 98.1 Height (in): 61 Pulse (bpm): 79 Weight (lbs): 170 Respiratory Rate (breaths/min): 16 Body Mass Index (BMI): 32.1 Blood Pressure (mmHg): 168/80 Reference Range: 80 - 120 mg / dl Electronic Signature(s) Signed: 08/25/2016 5:05:22 PM By: Elliot Gurney, RN, BSN, Kim RN, BSN Entered By: Elliot Gurney, RN, BSN, Kim on 08/25/2016 15:01:28

## 2016-08-25 NOTE — Progress Notes (Signed)
[name] ([id number]) Visit Report for 08/25/2016 Chief Complaint Document Details [name], [name]. Date of Service: 08/25/2016 3:30 PM Medical Record Patient Account Number: [id number] Number: Treating RN: Huel Coventry 1931/04/03 (82 y.o. Other Clinician: Date of Birth/Sex: Female) Treating ROBSON, MICHAEL Primary Care Physician/Extender: Letitia Libra, NARGES Physician: Referring Physician: Guy Begin Weeks in Treatment: 10 Information Obtained from: Patient Chief Complaint 06/10/16; the patient is here for review of a wound on the right anterior medial lower leg above the ankle Electronic Signature(s) Signed: 08/25/2016 4:19:04 PM By: Baltazar Najjar MD Entered By: Baltazar Najjar on 08/25/2016 16:09:29 Kunz, [name] ([id number]) -------------------------------------------------------------------------------- HPI Details [name] Date of Service: 08/25/2016 3:30 PM Medical Record Patient Account Number: [id number] Number: Treating RN: Huel Coventry 1931/05/14 (82 y.o. Other Clinician: Date of Birth/Sex: Female) Treating ROBSON, MICHAEL Primary Care Physician/Extender: Letitia Libra, NARGES Physician: Referring Physician: Guy Begin Weeks in Treatment: 10 History of Present Illness HPI Description: 06/10/16; this is an 82 year old woman who lives near [place] with family members. She saw her primary doctor roughly over a week ago and was referred to the ER for a cellulitis in the right medial leg. She was given a round of IV antibiotics in the emergency room and discharged on oral Keflex which she is taking. She is been left with a uncomfortable leg area on the right medial lower leg. Her ABIs in this clinic were 0.8 on the right 0.6 on the left. She does not describe claudication. She is not a diabetic. Lab work in the ER showed a normal BUN and creatinine on 6/20 at 18 and 0.92 respectively white count was 10.1  hemoglobin at 13.8. She did not have any imaging studies. I can see no vascular evaluations in Epic. She does not have a prior wound history 06/17/16; the area affected on her right medial ankle is an area I thought was due to tissue damage from cellulitis last week. She is going for venous reflux studies this afternoon, these were not ordered in this clinic at least not by me. The patient does not give a prior history of damage to the skin in this area although I wonder if she actually might not of noticed it. We have been using's Aquacel Ag under a wrap although we will not be able to wrap her today 06/24/16: pt reports today she hasn't heard from the vascular clinic regarding proceeding with arterial studies. our office will assist with this today. she denies s/s of infection. denies problems with her wraps. 07/07/16; since I last saw this woman she was admitted to hospital from 7/16 through 7/18 with sepsis syndrome felt to be secondary to her ulcer. She was treated with Vanco and Zosyn the hospital discharged on Septra. Blood cultures were negative. Urine culture showed multiple species she is now feeling well using Santyl with border foam to her wound. [facility] 07/14/16; no major improvement here. She has a quarter-sized wound covered with thick adherent slough on the lateral aspect of her right leg with several surrounding satellite lesions in a similar state. She has had 2 recent bouts of cellulitis, one before she came to this clinic and one required a recent hospitalization. I have asked for arterial studies I don't believe these have ever been done. My notes suggest from early in July that she went for reflux studies although I don't know that I have ever seen these either, she did have a DVT rule out  but I did not order this, this did not show a DVT but did show a Baker's cyst 07/21/2016 -- the patient had an arterial duplex study done with waveform suggestive of right  femoropopliteal disease. There is also small vessel disease bilaterally. Right ABI was in the normal range of 1.0 and a TBI was 0.74. Left ABI is abnormal at 0.69 with a TBI of 0.40. Three-vessel runoff on the right and a two-vessel runoff on the left. She has an appointment to see Dr. Kirke CorinArida later today. 07/28/2016 -- was seen by Dr. Kirke CorinArida who reviewed her arterial studies with an ABI of 0.94 on the right and Beaudin, Taleyah J. ([id number]) 0.69 on the left with normal right toe brachial index. Duplex showed diffuse atherosclerosis without discrete stenosis and there was a three-vessel runoff on the right below the knee. Based on these studies and the location of the ulcer he thought it was venous commended she continue with wound care consults. 08/04/16; I note the patient's reasonably functional arterial supply which is fortunate. The wound bed appears to be smaller. Debrided of surface slough. She is been using Iodoflex 08/11/16: Only a very small open area remains. However it has some depth 08/18/16; small open area is smaller. Still open however. 08/25/16; the area has totally closed down and epithelialized. Electronic Signature(s) Signed: 08/25/2016 4:19:04 PM By: Baltazar Najjarobson, Michael MD Entered By: Baltazar Najjarobson, Michael on 08/25/2016 16:10:01 Urquiza, [name] ([id number]) -------------------------------------------------------------------------------- Physical Exam Details Patient Name: Jordan BandaFULLER, Dezirae J. Date of Service: 08/25/2016 3:30 PM Medical Record Patient Account Number: [id number] Number: Treating RN: Huel CoventryWoody, Kim [birth year] (82 y.o. Other Clinician: Date of Birth/Sex: Female) Treating ROBSON, MICHAEL Primary Care Physician/Extender: Letitia LibraG FARAHI, NARGES Physician: Referring Physician: [name], NARGES Weeks in Treatment: 10 Constitutional Patient is hypertensive.. Pulse regular and within target range for patient.Marland Kitchen. Respirations regular, non-labored and within target range.. Temperature  is normal and within the target range for the patient.. Patient appears well. Notes Wound exam; the area is totally epithelialized at this point. I did remove some surface eschar there is nothing but epithelium underneath this no evidence of surrounding infection Electronic Signature(s) Signed: 08/25/2016 4:19:04 PM By: Baltazar Najjarobson, Michael MD Entered By: Baltazar Najjarobson, Michael on 08/25/2016 16:13:19 Klarich, [name] ([id number]) -------------------------------------------------------------------------------- Physician Orders Details Patient Name: Jordan BandaFULLER, Yadira J. Date of Service: 08/25/2016 3:30 PM Medical Record Patient Account Number: [id number] Number: Treating RN: Huel CoventryWoody, Kim [birth year] (82 y.o. Other Clinician: Date of Birth/Sex: Female) Treating ROBSON, MICHAEL Primary Care Physician/Extender: Letitia LibraG FARAHI, NARGES Physician: Referring Physician: Gabriel RungFARAHI, NARGES Weeks in Treatment: 10 Verbal / Phone Orders: Yes Clinician: Huel CoventryWoody, Kim Read Back and Verified: Yes Diagnosis Coding [facility] W[facility] #1 Right,Medial Lower Leg o Discharge from Wound Care Center Electronic Signature(s) Signed: 08/25/2016 4:19:04 PM By: Baltazar Najjarobson, Michael MD Signed: 08/25/2016 5:05:22 PM By: Elliot GurneyWoody, RN, BSN, Kim RN, BSN Entered By: Elliot GurneyWoody, RN, BSN, Kim on 08/25/2016 15:20:28 Jordan BandaFULLER, Vernice J. ([id number]) -------------------------------------------------------------------------------- Problem List Details Patient Name: Jordan BandaFULLER, Kordelia J. Date of Service: 08/25/2016 3:30 PM Medical Record Patient Account Number: [id number] Number: Treating RN: Huel CoventryWoody, Kim [birth year] (82 y.o. Other Clinician: Date of Birth/Sex: Female) Treating ROBSON, MICHAEL Primary Care Physician/Extender: Letitia LibraG FARAHI, NARGES Physician: Referring Physician: [name], NARGES Weeks in Treatment: 10 Active Problems ICD-10 Encounter Code Description Active Date Diagnosis L97.211 Non-pressure chronic ulcer of right calf  limited to 06/10/2016 Yes breakdown of skin L03.115 Cellulitis of right lower limb 06/10/2016 Yes I70.232 Atherosclerosis of native arteries of right leg with 06/10/2016 Yes ulceration  of calf Inactive Problems Resolved Problems Electronic Signature(s) Signed: 08/25/2016 4:19:04 PM By: Baltazar Najjar MD Entered By: Baltazar Najjar on 08/25/2016 16:09:06 Bines, [name] ([id number]) -------------------------------------------------------------------------------- Progress Note Details [name] Date of Service: 08/25/2016 3:30 PM Medical Record Patient Account Number: [id number] Number: Treating RN: Huel Coventry February 21, 1931 (82 y.o. Other Clinician: Date of Birth/Sex: Female) Treating ROBSON, MICHAEL Primary Care Physician/Extender: Letitia Libra, NARGES Physician: Referring Physician: Guy Begin Weeks in Treatment: 10 Subjective Chief Complaint Information obtained from Patient 06/10/16; the patient is here for review of a wound on the right anterior medial lower leg above the ankle History of Present Illness (HPI) 06/10/16; this is an 82 year old woman who lives near [place] with family members. She saw her primary doctor roughly over a week ago and was referred to the ER for a cellulitis in the right medial leg. She was given a round of IV antibiotics in the emergency room and discharged on oral Keflex which she is taking. She is been left with a uncomfortable leg area on the right medial lower leg. Her ABIs in this clinic were 0.8 on the right 0.6 on the left. She does not describe claudication. She is not a diabetic. Lab work in the ER showed a normal BUN and creatinine on 6/20 at 18 and 0.92 respectively white count was 10.1 hemoglobin at 13.8. She did not have any imaging studies. I can see no vascular evaluations in Epic. She does not have a prior wound history 06/17/16; the area affected on her right medial ankle is an area I thought was due to tissue  damage from cellulitis last week. She is going for venous reflux studies this afternoon, these were not ordered in this clinic at least not by me. The patient does not give a prior history of damage to the skin in this area although I wonder if she actually might not of noticed it. We have been using's Aquacel Ag under a wrap although we will not be able to wrap her today 06/24/16: pt reports today she hasn't heard from the vascular clinic regarding proceeding with arterial studies. our office will assist with this today. she denies s/s of infection. denies problems with her wraps. 07/07/16; since I last saw this woman she was admitted to hospital from 7/16 through 7/18 with sepsis syndrome felt to be secondary to her ulcer. She was treated with Vanco and Zosyn the hospital discharged on Septra. Blood cultures were negative. Urine culture showed multiple species she is now feeling well using Santyl with border foam to her wound. [facility] 07/14/16; no major improvement here. She has a quarter-sized wound covered with thick adherent slough on the lateral aspect of her right leg with several surrounding satellite lesions in a similar state. She has had 2 recent bouts of cellulitis, one before she came to this clinic and one required a recent hospitalization. I have asked for arterial studies I don't believe these have ever been done. My notes suggest from early in July that she went for reflux studies although I don't know that I have ever seen these either, she did have a DVT rule out but I did not order this, this did not show a DVT but did show a Baker's cyst 07/21/2016 -- the patient had an arterial duplex study done with waveform suggestive of right femoropopliteal Wurzel, Jeraldine J. ([id number]) disease. There is also small vessel disease bilaterally. Right ABI was in the normal range of 1.0  and a TBI was 0.74. Left ABI is abnormal at 0.69 with a TBI of 0.40. Three-vessel runoff on  the right and a two-vessel runoff on the left. She has an appointment to see Dr. Kirke Corin later today. 07/28/2016 -- was seen by Dr. Kirke Corin who reviewed her arterial studies with an ABI of 0.94 on the right and 0.69 on the left with normal right toe brachial index. Duplex showed diffuse atherosclerosis without discrete stenosis and there was a three-vessel runoff on the right below the knee. Based on these studies and the location of the ulcer he thought it was venous commended she continue with wound care consults. 08/04/16; I note the patient's reasonably functional arterial supply which is fortunate. The wound bed appears to be smaller. Debrided of surface slough. She is been using Iodoflex 08/11/16: Only a very small open area remains. However it has some depth 08/18/16; small open area is smaller. Still open however. 08/25/16; the area has totally closed down and epithelialized. Objective Constitutional Patient is hypertensive.. Pulse regular and within target range for patient.Marland Kitchen Respirations regular, non-labored and within target range.. Temperature is normal and within the target range for the patient.. Patient appears well. Vitals Time Taken: 3:01 PM, Height: 61 in, Weight: 170 lbs, BMI: 32.1, Temperature: 98.1 F, Pulse: 79 bpm, Respiratory Rate: 16 breaths/min, Blood Pressure: 168/80 mmHg. General Notes: Wound exam; the area is totally epithelialized at this point. I did remove some surface eschar there is nothing but epithelium underneath this no evidence of surrounding infection Integumentary (Hair, Skin) Wound #1 status is Open. Original cause of wound was Gradually Appeared. The wound is located on the Right,Medial Lower Leg. The wound measures 0cm length x 0cm width x 0cm depth; 0cm^2 area and 0cm^3 volume. The wound is limited to skin breakdown. There is no tunneling or undermining noted. There is a large amount of serous drainage noted. The wound margin is indistinct and nonvisible.  There is large (67-100%) red, pink granulation within the wound bed. There is no necrotic tissue within the wound bed. The periwound skin appearance exhibited: Scarring, Dry/Scaly, Hemosiderin Staining. The periwound skin appearance did not exhibit: Callus, Crepitus, Excoriation, Fluctuance, Friable, Induration, Localized Edema, Rash, Maceration, Moist, Atrophie Blanche, Cyanosis, Ecchymosis, Mottled, Pallor, Rubor, Erythema. Periwound temperature was noted as Cool/Cold. The periwound has tenderness on palpation. MIGNON, BECHLER ([id number]) Assessment Active Problems ICD-10 [phone] - Non-pressure chronic ulcer of right calf limited to breakdown of skin L03.115 - Cellulitis of right lower limb I70.232 - Atherosclerosis of native arteries of right leg with ulceration of calf Plan Discharge From [facility] Services: Wound #1 Right,Medial Lower Leg: Discharge from Wound Care Center Patient is discharged from the wound center her own compression stocking. Electronic Signature(s) Signed: 08/25/2016 4:19:04 PM By: Baltazar Najjar MD Entered By: Baltazar Najjar on 08/25/2016 16:13:46 Vandall, [name] ([id number]) -------------------------------------------------------------------------------- SuperBill Details [name] Date of Service: 08/25/2016 Medical Record Patient Account Number: [id number] Number: Treating RN: Huel Coventry [birth year] (82 y.o. Other Clinician: Date of Birth/Sex: Female) Treating ROBSON, MICHAEL Primary Care Physician/Extender: Letitia Libra, NARGES Physician: Weeks in Treatment: 10 Referring Physician: Guy Begin Diagnosis Coding ICD-10 Codes Code Description [phone] Non-pressure chronic ulcer of right calf limited to breakdown of skin L03.115 Cellulitis of right lower limb I70.232 Atherosclerosis of native arteries of right leg with ulceration of calf Facility Procedures CPT4 Code: [id number] Description: 25366 - WOUND CARE VISIT-LEV 2 EST  PT Modifier: Quantity: 1 Physician Procedures CPT4 Code Description: [id number] 25956 -  WC PHYS LEVEL 2 - EST PT ICD-10 Description Diagnosis 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 Modifier: reakdown of skin Quantity: 1 Electronic Signature(s) Signed: 08/25/2016 4:19:04 PM By: Baltazar Najjar MD Entered By: Baltazar Najjar on 08/25/2016 16:14:13

## 2016-11-24 ENCOUNTER — Other Ambulatory Visit: Payer: Self-pay | Admitting: Family Medicine

## 2016-11-24 DIAGNOSIS — M810 Age-related osteoporosis without current pathological fracture: Secondary | ICD-10-CM

## 2016-12-11 ENCOUNTER — Other Ambulatory Visit: Payer: Self-pay | Admitting: Family Medicine

## 2016-12-11 DIAGNOSIS — R42 Dizziness and giddiness: Secondary | ICD-10-CM

## 2017-01-07 ENCOUNTER — Ambulatory Visit: Payer: Medicare Other | Attending: Family Medicine

## 2017-01-12 IMAGING — CT CT ABD-PELV W/ CM
1 of 3 series · 13 of 32 positions shown, 18 images · IV contrast (omnipaque)
Comparison: Abdominal x-ray same day and CT scan 07/13/2015

CLINICAL DATA: Possible small bowel obstruction.

EXAM:
CT ABDOMEN AND PELVIS WITH CONTRAST
TECHNIQUE: Multidetector CT imaging of the abdomen and pelvis was performed
using the standard protocol following bolus administration of
intravenous contrast.
CONTRAST:  100mL OMNIPAQUE IOHEXOL 300 MG/ML  SOLN

[Series 2: routine abd pel with · axial · 0.74mm/px · z∈[-1008,-598]mm · 13 of 92 slices shown, 18 images]
[im 5/92  soft-tissue]
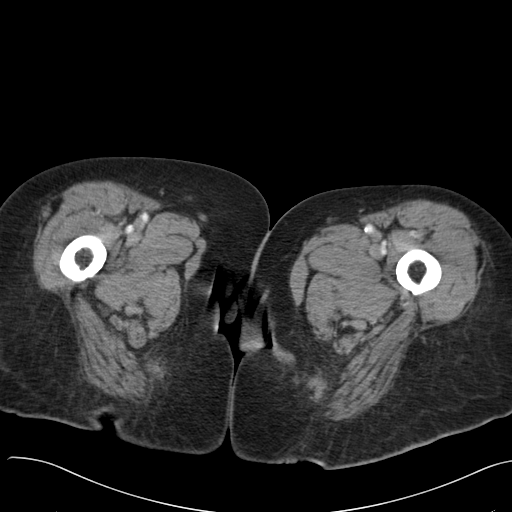
[im 5/92  bone]
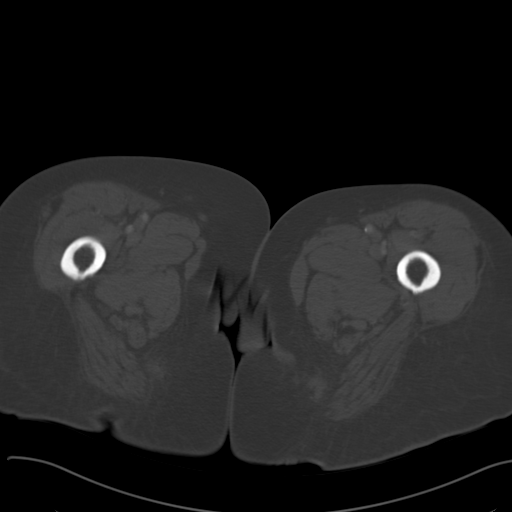
[im 14/92  soft-tissue]
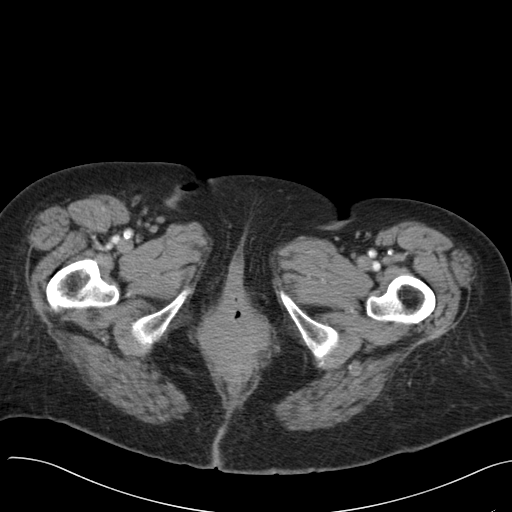
[im 19/92  soft-tissue]
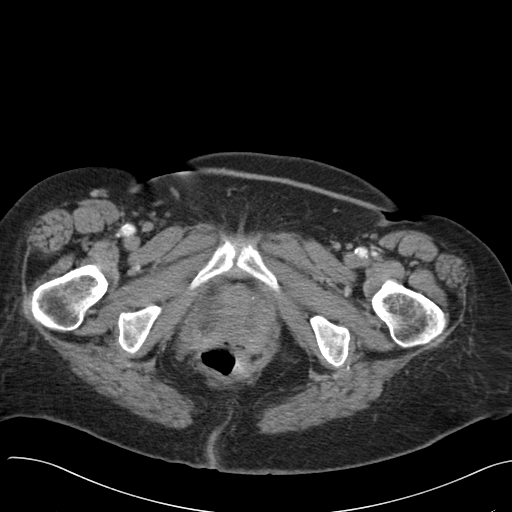
[im 28/92  soft-tissue]
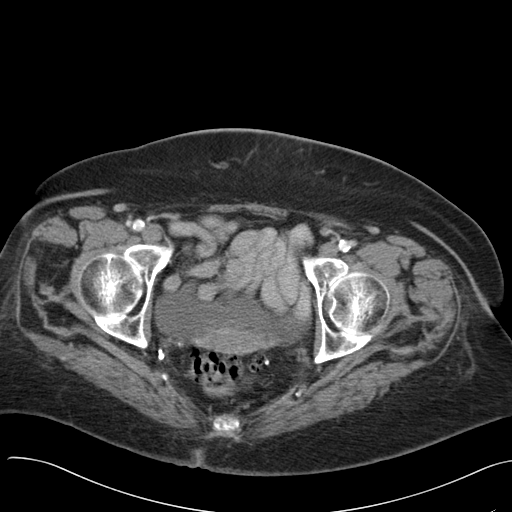
[im 37/92  soft-tissue]
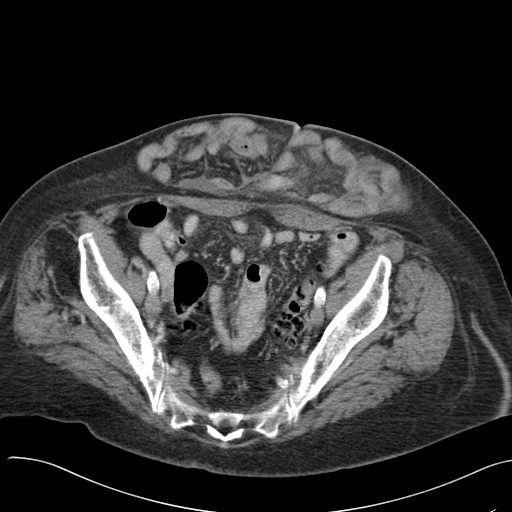
[im 41/92  soft-tissue]
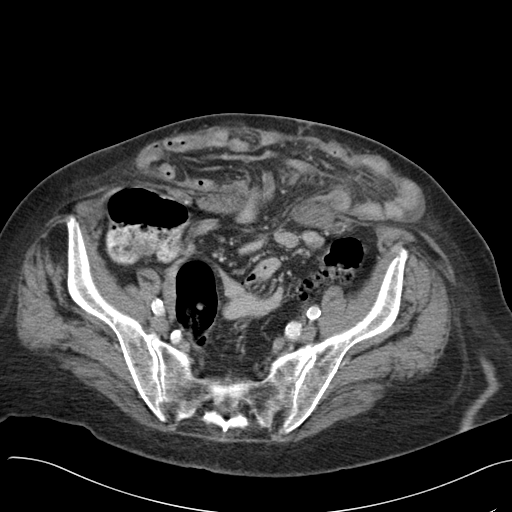
[im 51/92  soft-tissue]
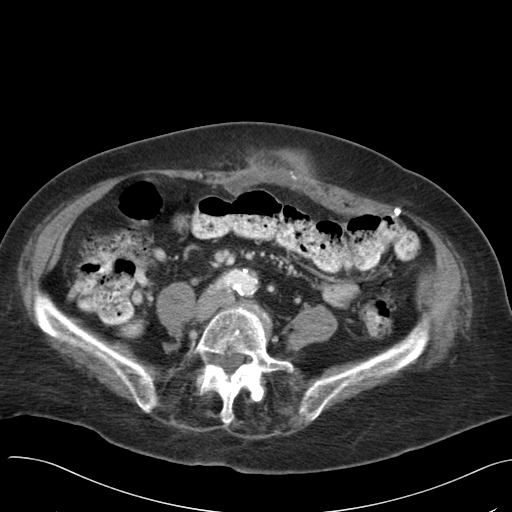
[im 55/92  soft-tissue]
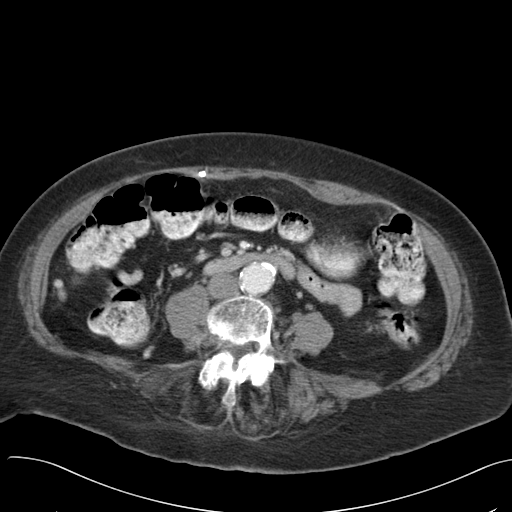
[im 64/92  soft-tissue]
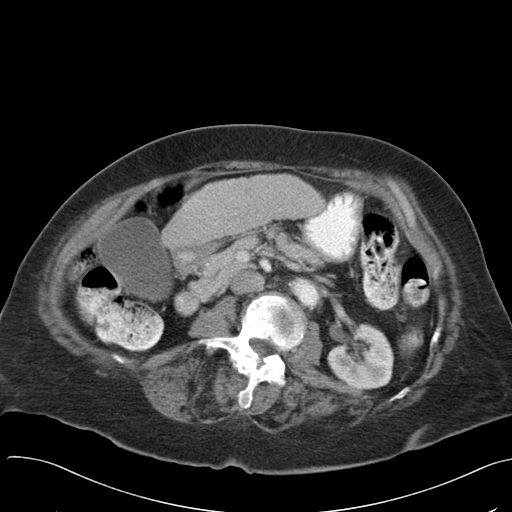
[im 64/92  bone]
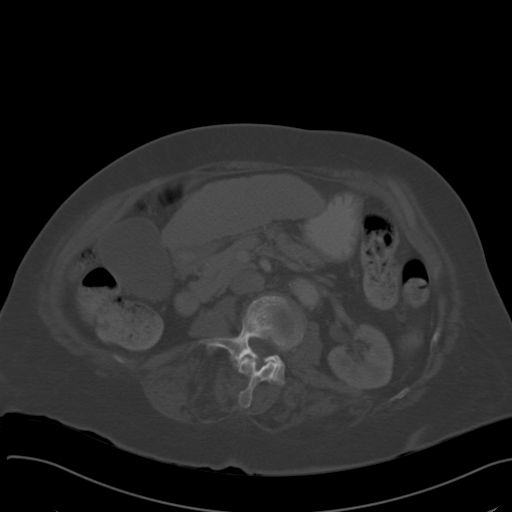
[im 73/92  soft-tissue]
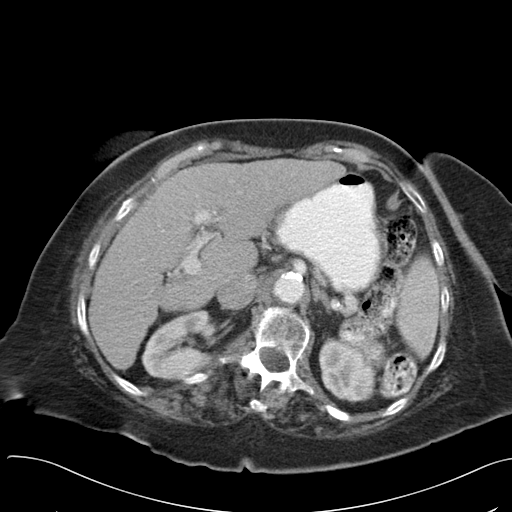
[im 73/92  lung]
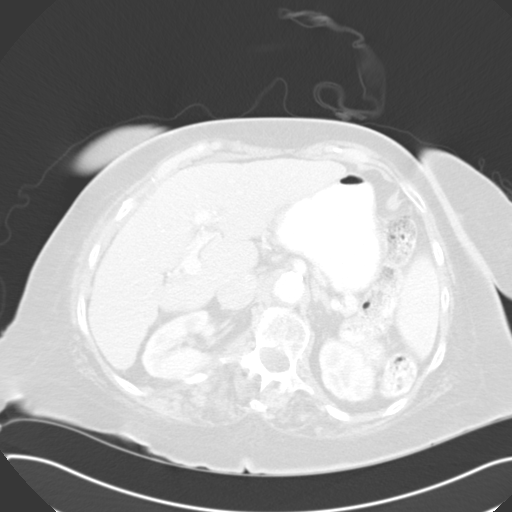
[im 78/92  soft-tissue]
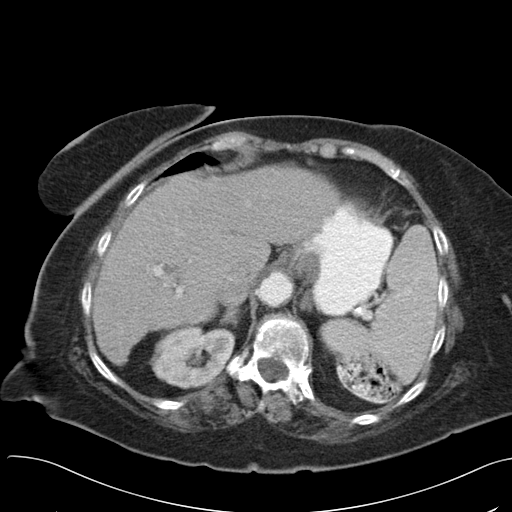
[im 78/92  lung]
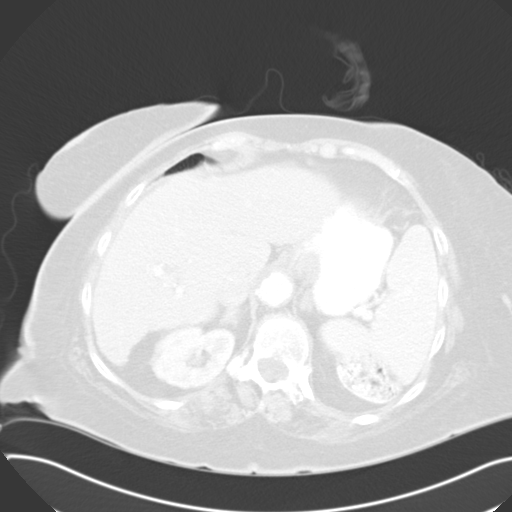
[im 82/92  lung]
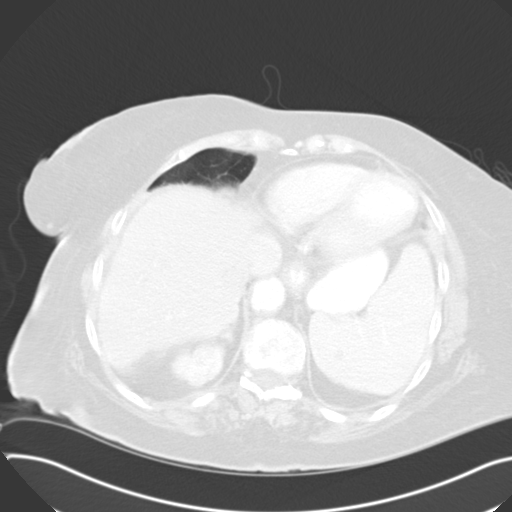
[im 87/92  soft-tissue]
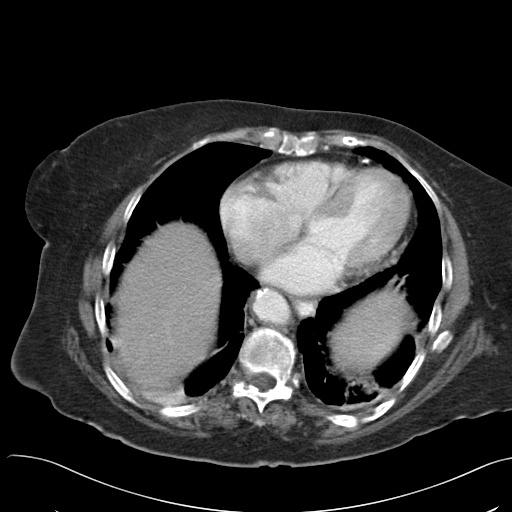
[im 87/92  lung]
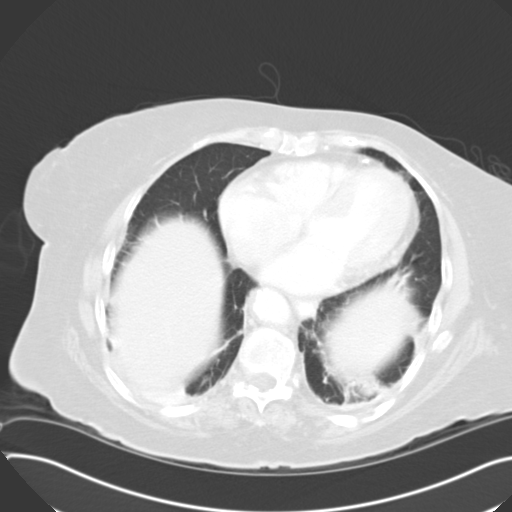

[13 of 32 positions shown; findings below may reference images not displayed]

FINDINGS: Lung bases shows stable pleural thickening and scarring right base
posteriorly. Atherosclerotic calcifications of coronary arteries.
Atherosclerotic calcifications of abdominal aorta, bilateral renal
artery, SMA, splenic artery and bilateral common iliac arteries are
again noted. Degenerative changes bilateral SI joints.

Sagittal images of the spine shows extensive degenerative changes
lumbar spine. Mild levoscoliosis of upper lumbar spine.

Again noted large prior umbilical and infraumbilical ventral hernia
containing small bowel loops without evidence of small bowel
obstruction. Trace amount of fluid noted inferior aspect of the
hernia. Small mesenteric edema within hernia is stable from prior
exam.

There is no small bowel obstruction. No ascites or free air. Normal
appendix. No pericecal inflammation. Some colonic stool and gas
noted in right colon. Colonic stool and some air-fluid levels noted
in transverse colon without evidence of colonic obstruction.
Moderate stool noted within descending colon. Colonic diverticula
are noted sigmoid colon without evidence of acute complication or
acute diverticulitis.

Mild distension of the gallbladder without evidence of calcified
gallstones.

Nodular contour of the liver consistent with cirrhosis again noted.
Stable collateral vessels in right paracolic gutter. There is no
focal hepatic mass. The pancreas, spleen and adrenal glands are
unremarkable. Kidneys are symmetrical in size and enhancement. No
hydronephrosis or hydroureter.

The urinary bladder is unremarkable. The uterus is atrophic. No
adnexal masses noted.
IMPRESSION: 1. Again noted large paraumbilical hernia containing small bowel
loops without evidence of acute complication or small bowel
obstruction. Trace fluid within inferior aspect of the hernia. Mild
mesenteric edema within hernia again noted.
2. Colonic diverticula are noted distal left colon and sigmoid
colon. No evidence of acute diverticulitis.
3. Again noted cirrhosis of the liver. Stable collateral veins in
right paracolic gutter.
4. No ascites or free air
5. Again noted extensive atherosclerotic vascular calcifications.
6. Unremarkable urinary bladder.
7. Degenerative changes lumbar spine.

## 2017-02-09 ENCOUNTER — Encounter: Payer: Medicare Other | Attending: Internal Medicine | Admitting: Internal Medicine

## 2017-02-09 DIAGNOSIS — M1990 Unspecified osteoarthritis, unspecified site: Secondary | ICD-10-CM | POA: Insufficient documentation

## 2017-02-09 DIAGNOSIS — L97313 Non-pressure chronic ulcer of right ankle with necrosis of muscle: Secondary | ICD-10-CM | POA: Diagnosis not present

## 2017-02-09 DIAGNOSIS — I87333 Chronic venous hypertension (idiopathic) with ulcer and inflammation of bilateral lower extremity: Secondary | ICD-10-CM | POA: Insufficient documentation

## 2017-02-09 DIAGNOSIS — L97221 Non-pressure chronic ulcer of left calf limited to breakdown of skin: Secondary | ICD-10-CM | POA: Insufficient documentation

## 2017-02-09 DIAGNOSIS — I10 Essential (primary) hypertension: Secondary | ICD-10-CM | POA: Insufficient documentation

## 2017-02-10 NOTE — Progress Notes (Signed)
[name], Jordan J. ([id number]) Visit Report for 02/09/2017 Abuse/Suicide Risk Screen Details [name], Jordan J. Date of Service: 02/09/2017 9:45 AM Medical Record Patient Account Number: [id number] Number: Treating RN: Phillis Haggisinkerton, Debi [birth year] (83 y.o. Other Clinician: Date of Birth/Sex: Female) Treating ROBSON, MICHAEL Primary Care Rorey Bisson: [name], NARGES Shakerria Parran/Extender: G Referring Jon Lall: [name], NARGES Weeks in Treatment: 0 Abuse/Suicide Risk Screen Items Answer ABUSE/SUICIDE RISK SCREEN: Has anyone close to you tried to hurt or harm you recentlyo No Do you feel uncomfortable with anyone in your familyo No Has anyone forced you do things that you didnot want to doo No Do you have any thoughts of harming yourselfo No Patient displays signs or symptoms of abuse and/or neglect. No Electronic Signature(s) Signed: 02/09/2017 4:34:40 PM By: Alejandro MullingPinkerton, Debra Entered By: Alejandro MullingPinkerton, Debra on 02/09/2017 09:43:11 Bertoni, [name] ([id number]) -------------------------------------------------------------------------------- Activities of Daily Living Details [name], Jordan J. Date of Service: 02/09/2017 9:45 AM Medical Record Patient Account Number: [id number] Number: Treating RN: Phillis Haggisinkerton, Debi [birth year] (83 y.o. Other Clinician: Date of Birth/Sex: Female) Treating ROBSON, MICHAEL Primary Care Terease Marcotte: [name], NARGES Dnasia Gauna/Extender: G Referring Kalif Kattner: [name], NARGES Weeks in Treatment: 0 Activities of Daily Living Items Answer Activities of Daily Living (Please select one for each item) Drive Automobile Not Able Take Medications Completely Able Use Telephone Completely Able Care for Appearance Completely Able Use Toilet Completely Able Bath / Shower Completely Able Dress Self Completely Able Feed Self Completely Able Walk Need Assistance Get In / Out Bed Completely Able Housework Need Assistance Prepare Meals Need  Assistance Handle Money Completely Able Shop for Self Need Assistance Electronic Signature(s) Signed: 02/09/2017 4:34:40 PM By: Alejandro MullingPinkerton, Debra Entered By: Alejandro MullingPinkerton, Debra on 02/09/2017 09:43:51 Coto, [name] ([id number]) -------------------------------------------------------------------------------- Education Assessment Details [name], Jordan J. Date of Service: 02/09/2017 9:45 AM Medical Record Patient Account Number: [id number] Number: Treating RN: Phillis Haggisinkerton, Debi [birth year] (83 y.o. Other Clinician: Date of Birth/Sex: Female) Treating ROBSON, MICHAEL Primary Care Bonnie Overdorf: [name], NARGES Antony Sian/Extender: G Referring Ambreen Tufte: Gabriel RungFARAHI, NARGES Weeks in Treatment: 0 Primary Learner Assessed: Patient Learning Preferences/Education Level/Primary Language Learning Preference: Explanation, Printed Material Highest Education Level: High School Preferred Language: English Cognitive Barrier Assessment/Beliefs Language Barrier: No Translator Needed: No Memory Deficit: No Emotional Barrier: No Cultural/Religious Beliefs Affecting Medical No Care: Physical Barrier Assessment Impaired Vision: Yes Glasses Impaired Hearing: No Decreased Hand dexterity: No Knowledge/Comprehension Assessment Knowledge Level: Medium Comprehension Level: Medium Ability to understand written Medium instructions: Ability to understand verbal Medium instructions: Motivation Assessment Anxiety Level: Calm Cooperation: Cooperative Education Importance: Acknowledges Need Interest in Health Problems: Asks Questions Perception: Coherent Willingness to Engage in Self- Medium Management Activities: Readiness to Engage in Self- Medium Management Activities: [name], Jordan J. ([id number]) Electronic Signature(s) Signed: 02/09/2017 4:34:40 PM By: Alejandro MullingPinkerton, Debra Entered By: Alejandro MullingPinkerton, Debra on 02/09/2017 09:44:12 Hoglund, [name]  ([id number]) -------------------------------------------------------------------------------- Fall Risk Assessment Details [name], Jordan J. Date of Service: 02/09/2017 9:45 AM Medical Record Patient Account Number: [id number] Number: Treating RN: Phillis Haggisinkerton, Debi [birth year] (83 y.o. Other Clinician: Date of Birth/Sex: Female) Treating ROBSON, MICHAEL Primary Care Lailani Tool: [name], NARGES Joie Hipps/Extender: G Referring Darrin Koman: [name], NARGES Weeks in Treatment: 0 Fall Risk Assessment Items Have you had 2 or more falls in the last 12 monthso 0 No Have you had any fall that resulted in injury in the last 12 monthso 0 No FALL RISK ASSESSMENT: History of falling - immediate or within 3 months 0 No Secondary diagnosis 15 Yes Ambulatory aid None/bed rest/wheelchair/nurse 0  No Crutches/cane/walker 0 No Furniture 0 No IV Access/Saline Lock 0 No Gait/Training Normal/bed rest/immobile 0 No Weak 0 No Impaired 20 Yes Mental Status Oriented to own ability 0 Yes Electronic Signature(s) Signed: 02/09/2017 4:34:40 PM By: [name] Entered By: [name] on 02/09/2017 09:44:26 Trampe, [name] -------------------------------------------------------------------------------- Foot Assessment Details Patient Name: Jordan Banda. Date of Service: 02/09/2017 9:45 AM Medical Record Patient Account Number: [id number] Number: Treating RN: [name] 1931/04/02 (83 y.o. Other Clinician: Date of Birth/Sex: Female) Treating ROBSON, MICHAEL Primary Care Austyn Perriello: Guy Begin Jonovan Boedecker/Extender: G Referring Indigo Barbian: Guy Begin Weeks in Treatment: 0 Foot Assessment Items Site Locations + = Sensation present, - = Sensation absent, C = Callus, U = Ulcer R = Redness, W = Warmth, M = Maceration, PU = Pre-ulcerative lesion [name] = Fissure, S = Swelling, D = Dryness Assessment Right: Left: Other Deformity: No No Prior Foot Ulcer: No  No Prior Amputation: No No Charcot Joint: No No Ambulatory Status: Ambulatory With Help Assistance Device: Cane Gait: Steady Electronic Signature(s) Signed: 02/09/2017 4:34:40 PM By: [name] Entered By: [name] on 02/09/2017 09:48:39 Cavalieri, [name] ([id number]) [name], Fatumata Shela Commons ([id number]) -------------------------------------------------------------------------------- Nutrition Risk Assessment Details Patient Name: Jordan Banda. Date of Service: 02/09/2017 9:45 AM Medical Record Patient Account Number: [id number] Number: Treating RN: [name] [birth year] (83 y.o. Other Clinician: Date of Birth/Sex: Female) Treating ROBSON, MICHAEL Primary Care Lynnda Wiersma: Guy Begin Laikyn Gewirtz/Extender: G Referring Elanda Garmany: Guy Begin Weeks in Treatment: 0 Height (in): 66 Weight (lbs): 159 Body Mass Index (BMI): 25.7 Nutrition Risk Assessment Items NUTRITION RISK SCREEN: I have an illness or condition that made me change the kind and/or 0 No amount of food I eat I eat fewer than two meals per day 0 No I eat few fruits and vegetables, or milk products 0 No I have three or more drinks of beer, liquor or wine almost every day 0 No I have tooth or mouth problems that make it hard for me to eat 0 No I don't always have enough money to buy the food I need 0 No I eat alone most of the time 0 No I take three or more different prescribed or over-the-counter drugs a 1 Yes day Without wanting to, I have lost or gained 10 pounds in the last six 0 No months I am not always physically able to shop, cook and/or feed myself 2 Yes Nutrition Protocols Good Risk Protocol Moderate Risk Protocol Electronic Signature(s) Signed: 02/09/2017 4:34:40 PM By: [name] Entered By: [name] on 02/09/2017 09:44:35

## 2017-02-11 NOTE — Progress Notes (Signed)
[name], Jordan J. ([id number]) Visit Report for 02/09/2017 Allergy List Details [name], Jordan J. Date of Service: 02/09/2017 9:45 AM Medical Record Patient Account Number: [id number] Number: Treating RN: Phillis Haggisinkerton, Debi 10/02/1931 (83 y.o. Other Clinician: Date of Birth/Sex: Female) Treating ROBSON, MICHAEL Primary Care [name]: [name], [name]: G Referring [name]: [name], NARGES Weeks in Treatment: 0 Allergies Active Allergies sulfur Allergy Notes Electronic Signature(s) Signed: 02/09/2017 4:34:40 PM By: Alejandro MullingPinkerton, Debra Entered By: Alejandro MullingPinkerton, Debra on 02/09/2017 09:40:44 Lanni, [name] ([id number]) -------------------------------------------------------------------------------- Arrival Information Details [name], Jordan J. Date of Service: 02/09/2017 9:45 AM Medical Record Patient Account Number: [id number] Number: Treating RN: Phillis Haggisinkerton, Debi 10/02/1931 (83 y.o. Other Clinician: Date of Birth/Sex: Female) Treating ROBSON, MICHAEL Primary Care Providencia Hottenstein: [name], NARGES [name]: G Referring [name]: Gabriel RungFARAHI, NARGES Weeks in Treatment: 0 Visit Information Patient Arrived: Wheel Chair Arrival Time: 09:33 Accompanied By: daughter Transfer Assistance: EasyPivot Patient Lift Patient Identification Verified: Yes Secondary Verification Process Yes Completed: Patient Requires Transmission- No Based Precautions: Patient Has Alerts: No History Since Last Visit All ordered tests and consults were completed: No Added or deleted any medications: No Any new allergies or adverse reactions: No Had a fall or experienced change in activities of daily living that may affect risk of falls: No Signs or symptoms of abuse/neglect since last visito No Hospitalized since last visit: No Electronic Signature(s) Signed: 02/09/2017 4:34:40 PM By: Alejandro MullingPinkerton, Debra Entered By: Alejandro MullingPinkerton, Debra on 02/09/2017  09:38:04 Shackleford, Eneida Shela CommonsJ. ([id number]) -------------------------------------------------------------------------------- Clinic Level of Care Assessment Details [name], Jordan J. Date of Service: 02/09/2017 9:45 AM Medical Record Patient Account Number: [id number] Number: Treating RN: Phillis Haggisinkerton, Debi 10/02/1931 (83 y.o. Other Clinician: Date of Birth/Sex: Female) Treating ROBSON, MICHAEL Primary Care Coley Kulikowski: [name], NARGES [name]: G Referring [name]: Gabriel RungFARAHI, NARGES Weeks in Treatment: 0 Clinic Level of Care Assessment Items TOOL 1 Quantity Score X - Use when EandM and Procedure is performed on INITIAL visit 1 0 ASSESSMENTS - Nursing Assessment / Reassessment X - General Physical Exam (combine w/ comprehensive assessment (listed just 1 20 below) when performed on new pt. evals) X - Comprehensive Assessment (HX, ROS, Risk Assessments, Wounds Hx, etc.) 1 25 ASSESSMENTS - Wound and Skin Assessment / Reassessment []  - Dermatologic / Skin Assessment (not related to wound area) 0 ASSESSMENTS - Ostomy and/or Continence Assessment and Care []  - Incontinence Assessment and Management 0 []  - Ostomy Care Assessment and Management (repouching, etc.) 0 PROCESS - Coordination of Care []  - Simple Patient / Family Education for ongoing care 0 X - Complex (extensive) Patient / Family Education for ongoing care 1 20 X - Staff obtains ChiropractorConsents, Records, Test Results / Process Orders 1 10 []  - Staff telephones HHA, Nursing Homes / Clarify orders / etc 0 []  - Routine Transfer to another Facility (non-emergent condition) 0 []  - Routine Hospital Admission (non-emergent condition) 0 X - New Admissions / Manufacturing engineernsurance Authorizations / Ordering NPWT, Apligraf, etc. 1 15 []  - Emergency Hospital Admission (emergent condition) 0 PROCESS - Special Needs []  - Pediatric / Minor Patient Management 0 Creegan, Cornell J. ([id number]) []  - Isolation Patient Management 0 []  - Hearing /  Language / Visual special needs 0 []  - Assessment of Community assistance (transportation, D/C planning, etc.) 0 []  - Additional assistance / Altered mentation 0 []  - Support Surface(s) Assessment (bed, cushion, seat, etc.) 0 INTERVENTIONS - Miscellaneous []  - External ear exam 0 []  - Patient Transfer (multiple staff / Nurse, adultHoyer Lift / Similar devices) 0 []  -  Simple Staple / Suture removal (25 or less) 0 []  - Complex Staple / Suture removal (26 or more) 0 []  - Hypo/Hyperglycemic Management (do not check if billed separately) 0 X - Ankle / Brachial Index (ABI) - do not check if billed separately 1 15 Has the patient been seen at the hospital within the last three years: Yes Total Score: 105 Level Of Care: New/Established - Level 3 Electronic Signature(s) Signed: 02/09/2017 4:34:40 PM By: [name] Entered By: [name] on 02/09/2017 10:45:57 [name] ([id number]) -------------------------------------------------------------------------------- Encounter Discharge Information Details Patient Name: Jordan Banda. Date of Service: 02/09/2017 9:45 AM Medical Record Patient Account Number: [id number] Number: Treating RN: [name] [birth year] (83 y.o. Other Clinician: Date of Birth/Sex: Female) Treating ROBSON, MICHAEL Primary Care Suman Trivedi: Guy Begin Yarah Fuente/Extender: G Referring Davonn Flanery: [name] in Treatment: 0 Encounter Discharge Information Items Discharge Pain Level: 0 Discharge Condition: Stable Ambulatory Status: Wheelchair Discharge Destination: Home Transportation: Private Auto Accompanied By: daughter Schedule Follow-up Appointment: Yes Medication Reconciliation completed and provided to Patient/Care No Rosella Crandell: Provided on Clinical Summary of Care: 02/09/2017 Form Type Recipient Paper Patient CF Electronic Signature(s) Signed: 02/09/2017 10:34:44 AM By: Gwenlyn Perking Entered By: Gwenlyn Perking on 02/09/2017  10:34:43 Dercole, Gardiner Hopkins ([id number]) -------------------------------------------------------------------------------- Lower Extremity Assessment Details Patient Name: Jordan Banda. Date of Service: 02/09/2017 9:45 AM Medical Record Patient Account Number: [id number] Number: Treating RN: [name] July 16, 1931 (83 y.o. Other Clinician: Date of Birth/Sex: Female) Treating ROBSON, MICHAEL Primary Care Veda Arrellano: Guy Begin Diaz Crago/Extender: G Referring Raymie Giammarco: Guy Begin Weeks in Treatment: 0 Edema Assessment Assessed: [Left: No] [Right: No] Edema: [Left: Ye] [Right: s] Calf Left: Right: Point of Measurement: 34 cm From Medial Instep 32.5 cm 35.5 cm Ankle Left: Right: Point of Measurement: 9 cm From Medial Instep 24.4 cm 26.5 cm Vascular Assessment Pulses: Dorsalis Pedis Palpable: [Left:No] [Right:No] Posterior Tibial Extremity colors, hair growth, and conditions: Extremity Color: [Left:Hyperpigmented] [Right:Hyperpigmented] Hair Growth on Extremity: [Left:No] [Right:No] Temperature of Extremity: [Left:Warm] [Right:Warm] Capillary Refill: [Left:> 3 seconds] [Right:> 3 seconds] Blood Pressure: Brachial: [Left:140] [Right:140] Dorsalis Pedis: 78 [Left:Dorsalis Pedis: 130] Ankle: Posterior Tibial: 78 [Left:Posterior Tibial: 138 0.56] [Right:0.99] Toe Nail Assessment Left: Right: Thick: Yes Yes Discolored: Yes Yes Deformed: Yes Yes Improper Length and Hygiene: No No [name], [name] ([id number]) Electronic Signature(s) Signed: 02/09/2017 4:34:40 PM By: [name] Entered By: [name] on 02/09/2017 09:58:18 Klaas, Linnette Shela Commons ([id number]) -------------------------------------------------------------------------------- Multi Wound Chart Details Patient Name: Jordan Banda. Date of Service: 02/09/2017 9:45 AM Medical Record Patient Account Number: [id number] Number: Treating RN: [name] 1931-09-15 (83 y.o. Other  Clinician: Date of Birth/Sex: Female) Treating ROBSON, MICHAEL Primary Care Teliah Buffalo: Guy Begin Hadley Detloff/Extender: G Referring Jersi Mcmaster: Guy Begin Weeks in Treatment: 0 Vital Signs Height(in): 66 Pulse(bpm): 72 Weight(lbs): 159 Blood Pressure 154/82 (mmHg): Body Mass Index(BMI): 26 Temperature(F): 97.5 Respiratory Rate 16 (breaths/min): Photos: [2:No Photos] [3:No Photos] [N/A:N/A] Wound Location: [2:Right Malleolus - Lateral] [3:Left Lower Leg - Medial] [N/A:N/A] Wounding Event: [2:Gradually Appeared] [3:Gradually Appeared] [N/A:N/A] Primary Etiology: [2:Venous Leg Ulcer] [3:Venous Leg Ulcer] [N/A:N/A] Comorbid History: [2:Cataracts, Arrhythmia, Hypertension, Osteoarthritis] [3:Cataracts, Arrhythmia, Hypertension, Osteoarthritis] [N/A:N/A] Date Acquired: [2:01/19/2017] [3:01/19/2017] [N/A:N/A] Weeks of Treatment: [2:0] [3:0] [N/A:N/A] Wound Status: [2:Open] [3:Open] [N/A:N/A] Measurements L x W x D 3.1x1.7x0.1 [3:1x0.5x0.1] [N/A:N/A] (cm) Area (cm) : [2:4.139] [3:0.393] [N/A:N/A] Volume (cm) : [2:0.414] [3:0.039] [N/A:N/A] Classification: [2:Partial Thickness] [3:Partial Thickness] [N/A:N/A] Exudate Amount: [2:Large] [3:Large] [N/A:N/A] Exudate Type: [2:Serosanguineous] [3:Serosanguineous] [N/A:N/A] Exudate Color: [2:red, brown] [3:red, brown] [N/A:N/A]  Wound Margin: [2:Distinct, outline attached] [3:Distinct, outline attached] [N/A:N/A] Granulation Amount: [2:None Present (0%)] [3:Medium (34-66%)] [N/A:N/A] Granulation Quality: [2:N/A] [3:Red] [N/A:N/A] Necrotic Amount: [2:Large (67-100%)] [3:Medium (34-66%)] [N/A:N/A] Exposed Structures: [2:Fascia: No Fat Layer (Subcutaneous Tissue) Exposed: No Tendon: No Muscle: No Joint: No] [3:Fascia: No Fat Layer (Subcutaneous Tissue) Exposed: No Tendon: No Muscle: No Joint: No] [N/A:N/A] Bone: No Bone: No Limited to Skin Limited to Skin Breakdown Breakdown Epithelialization: None None N/A Debridement: Debridement  ([id number]- N/A N/A 11047) Pre-procedure 10:10 N/A N/A Verification/Time Out Taken: Pain Control: Lidocaine 4% Topical N/A N/A Solution Tissue Debrided: Fibrin/Slough, Exudates, N/A N/A Subcutaneous Level: Skin/Subcutaneous N/A N/A Tissue Debridement Area (sq 5.27 N/A N/A cm): Instrument: Curette N/A N/A Bleeding: Minimum N/A N/A Hemostasis Achieved: Pressure N/A N/A Procedural Pain: 0 N/A N/A Post Procedural Pain: 0 N/A N/A Debridement Treatment Procedure was tolerated N/A N/A Response: well Post Debridement 3.1x1.7x0.1 N/A N/A Measurements L x W x D (cm) Post Debridement 0.414 N/A N/A Volume: (cm) Periwound Skin Texture: No Abnormalities Noted No Abnormalities Noted N/A Periwound Skin No Abnormalities Noted No Abnormalities Noted N/A Moisture: Periwound Skin Color: No Abnormalities Noted No Abnormalities Noted N/A Temperature: No Abnormality No Abnormality N/A Tenderness on Yes Yes N/A Palpation: Wound Preparation: Ulcer Cleansing: Ulcer Cleansing: N/A Rinsed/Irrigated with Rinsed/Irrigated with Saline Saline Topical Anesthetic Topical Anesthetic Applied: Other: lidocaine Applied: Other: lidocaine 4% 4% Procedures Performed: Debridement N/A N/A Treatment Notes Wound #2 (Right, Lateral Malleolus) 1. Cleansed with: Clean wound with Normal Saline Silliman, Neita J. ([id number]) 2. Anesthetic Topical Lidocaine 4% cream to wound bed prior to debridement 3. Peri-wound Care: Other peri-wound care (specify in notes) 4. Dressing Applied: Prisma Ag 5. Secondary Dressing Applied ABD Pad Dry Gauze 7. Secured with Tape Notes kerlix, coban, unna to anchor, drawtex Wound #3 (Left, Medial Lower Leg) 1. Cleansed with: Clean wound with Normal Saline 2. Anesthetic Topical Lidocaine 4% cream to wound bed prior to debridement 3. Peri-wound Care: Other peri-wound care (specify in notes) 4. Dressing Applied: Prisma Ag 5. Secondary Dressing Applied ABD Pad Dry Gauze 7.  Secured with Tape Notes kerlix, coban, unna to anchor, drawtex Electronic Signature(s) Signed: 02/09/2017 4:34:40 PM By: [name] Entered By: [name] on 02/09/2017 10:41:17 Amparo, Gardiner Hopkins ([id number]) -------------------------------------------------------------------------------- Multi-Disciplinary Care Plan Details [name], WESSLING. Date of Service: 02/09/2017 9:45 AM Medical Record Patient Account Number: [id number] Number: Treating RN: [name] 05-Nov-1931 (83 y.o. Other Clinician: Date of Birth/Sex: Female) Treating ROBSON, MICHAEL Primary Care Hero Kulish: Guy Begin Cristel Rail/Extender: G Referring Jackquelyn Sundberg: [name] in Treatment: 0 Active Inactive ` Abuse / Safety / Falls / Self Care Management Nursing Diagnoses: Potential for falls Goals: Patient will remain injury free Date Initiated: 02/09/2017 Target Resolution Date: 04/17/2017 Goal Status: Active Interventions: Assess fall risk on admission and as needed Assess self care needs on admission and as needed Notes: ` Nutrition Nursing Diagnoses: Imbalanced nutrition Goals: Patient/caregiver agrees to and verbalizes understanding of need to use nutritional supplements and/or vitamins as prescribed Date Initiated: 02/09/2017 Target Resolution Date: 04/17/2017 Goal Status: Active Interventions: Assess patient nutrition upon admission and as needed per policy Notes: ` Orientation to the Wound Care Program [name], [name] ([id number]) Nursing Diagnoses: Knowledge deficit related to the wound healing center program Goals: Patient/caregiver will verbalize understanding of the Wound Healing Center Program Date Initiated: 02/09/2017 Target Resolution Date: 02/20/2017 Goal Status: Active Interventions: Provide education on orientation to the wound center Notes: ` Pain, Acute or Chronic Nursing Diagnoses: Pain, acute or  chronic: actual or potential Potential  alteration in comfort, pain Goals: Patient/caregiver will verbalize adequate pain control between visits Date Initiated: 02/09/2017 Target Resolution Date: 04/17/2017 Goal Status: Active Interventions: Assess comfort goal upon admission Complete pain assessment as per visit requirements Notes: ` Wound/Skin Impairment Nursing Diagnoses: Impaired tissue integrity Knowledge deficit related to smoking impact on wound healing Knowledge deficit related to ulceration/compromised skin integrity Goals: Ulcer/skin breakdown will have a volume reduction of 80% by week 12 Date Initiated: 02/09/2017 Target Resolution Date: 04/10/2017 Goal Status: Active Interventions: Assess patient/caregiver ability to perform ulcer/skin care regimen upon admission and as needed [name], [name] ([id number]) Assess ulceration(s) every visit Notes: Electronic Signature(s) Signed: 02/09/2017 4:34:40 PM By: [name] Entered By: [name] on 02/09/2017 10:41:01 Paddock, Gardiner Hopkins ([id number]) -------------------------------------------------------------------------------- Pain Assessment Details Patient Name: Jordan Banda. Date of Service: 02/09/2017 9:45 AM Medical Record Patient Account Number: [id number] Number: Treating RN: [name] [birth year] (83 y.o. Other Clinician: Date of Birth/Sex: Female) Treating ROBSON, MICHAEL Primary Care Eiden Bagot: Guy Begin Taimur Fier/Extender: G Referring Syris Brookens: Guy Begin Weeks in Treatment: 0 Active Problems Location of Pain Severity and Description of Pain Patient Has Paino Yes Site Locations Pain Location: Pain in Ulcers With Dressing Change: Yes Rate the pain. Current Pain Level: 8 Character of Pain Describe the Pain: Burning, Stabbing, Tender, Throbbing Pain Management and Medication Current Pain Management: Electronic Signature(s) Signed: 02/09/2017 4:34:40 PM By: [name] Entered By: [name] on  02/09/2017 09:38:21 Kendrick, Gardiner Hopkins ([id number]) -------------------------------------------------------------------------------- Patient/Caregiver Education Details Patient Name: Jordan Banda. Date of Service: 02/09/2017 9:45 AM Medical Record Patient Account Number: [id number] Number: Treating RN: [name] 09/29/1931 (83 y.o. Other Clinician: Date of Birth/Gender: Female) Treating ROBSON, MICHAEL Primary Care Physician: Guy Begin Physician/Extender: G Referring Physician: [name] in Treatment: 0 Education Assessment Education Provided To: Patient Education Topics Provided Welcome To The Wound Care Center: Handouts: Welcome To The Wound [facility] Methods: Explain/Verbal Responses: State content correctly Wound/Skin Impairment: Handouts: Other: do not get dressing wet Methods: Demonstration, Explain/Verbal Responses: State content correctly Electronic Signature(s) Signed: 02/09/2017 4:34:40 PM By: [name] Entered By: [name] on 02/09/2017 10:42:16 Meara, Gardiner Hopkins ([id number]) -------------------------------------------------------------------------------- Wound Assessment Details Patient Name: Jordan Banda. Date of Service: 02/09/2017 9:45 AM Medical Record Patient Account Number: [id number] Number: Treating RN: [name] 13-Jan-1931 (83 y.o. Other Clinician: Date of Birth/Sex: Female) Treating ROBSON, MICHAEL Primary Care Winton Offord: Guy Begin Simaya Lumadue/Extender: G Referring Jontavious Commons: Guy Begin Weeks in Treatment: 0 Wound Status Wound Number: 2 Primary Venous Leg Ulcer Etiology: Wound Location: Right Malleolus - Lateral Wound Status: Open Wounding Event: Gradually Appeared Comorbid Cataracts, Arrhythmia, Hypertension, Date Acquired: 01/19/2017 History: Osteoarthritis Weeks Of Treatment: 0 Clustered Wound: No Photos Photo Uploaded By: [name] on 02/09/2017 11:26:27 Wound  Measurements Length: (cm) 3.1 Width: (cm) 1.7 Depth: (cm) 0.1 Area: (cm) 4.139 Volume: (cm) 0.414 % Reduction in Area: % Reduction in Volume: Epithelialization: None Tunneling: No Undermining: No Wound Description Classification: Partial Thickness Foul Odor Afte Wound Margin: Distinct, outline attached Slough/Fibrino Exudate Amount: Large Exudate Type: Serosanguineous Exudate Color: red, brown r Cleansing: No Yes Wound Bed Granulation Amount: None Present (0%) Exposed Structure Necrotic Amount: Large (67-100%) Fascia Exposed: No Necrotic Quality: Adherent Slough Fat Layer (Subcutaneous Tissue) Exposed: No Aycock, Anet J. ([id number]) Tendon Exposed: No Muscle Exposed: No Joint Exposed: No Bone Exposed: No Limited to Skin Breakdown Periwound Skin Texture Texture Color No Abnormalities Noted: No No Abnormalities Noted: No Moisture Temperature / Pain  No Abnormalities Noted: No Temperature: No Abnormality Tenderness on Palpation: Yes Wound Preparation Ulcer Cleansing: Rinsed/Irrigated with Saline Topical Anesthetic Applied: Other: lidocaine 4%, Treatment Notes Wound #2 (Right, Lateral Malleolus) 1. Cleansed with: Clean wound with Normal Saline 2. Anesthetic Topical Lidocaine 4% cream to wound bed prior to debridement 3. Peri-wound Care: Other peri-wound care (specify in notes) 4. Dressing Applied: Prisma Ag 5. Secondary Dressing Applied ABD Pad Dry Gauze 7. Secured with Tape Notes kerlix, coban, unna to anchor, drawtex Electronic Signature(s) Signed: 02/09/2017 4:34:40 PM By: [name] Entered By: [name] on 02/09/2017 10:01:04 ALIVIYA, SCHOELLER ([id number]) -------------------------------------------------------------------------------- Wound Assessment Details Patient Name: Jordan Banda. Date of Service: 02/09/2017 9:45 AM Medical Record Patient Account Number: [id number] Number: Treating RN: [name] April 12, 1931  (83 y.o. Other Clinician: Date of Birth/Sex: Female) Treating ROBSON, MICHAEL Primary Care Dicky Boer: Guy Begin Ahonesty Woodfin/Extender: G Referring Joshva Labreck: Guy Begin Weeks in Treatment: 0 Wound Status Wound Number: 3 Primary Venous Leg Ulcer Etiology: Wound Location: Left Lower Leg - Medial Wound Status: Open Wounding Event: Gradually Appeared Comorbid Cataracts, Arrhythmia, Hypertension, Date Acquired: 01/19/2017 History: Osteoarthritis Weeks Of Treatment: 0 Clustered Wound: No Photos Photo Uploaded By: [name] on 02/09/2017 11:26:28 Wound Measurements Length: (cm) 1 Width: (cm) 0.5 Depth: (cm) 0.1 Area: (cm) 0.393 Volume: (cm) 0.039 % Reduction in Area: % Reduction in Volume: Epithelialization: None Tunneling: No Undermining: No Wound Description Classification: Partial Thickness Foul Odor Aft Wound Margin: Distinct, outline attached Slough/Fibrin Exudate Amount: Large Exudate Type: Serosanguineous Exudate Color: red, brown er Cleansing: No o No Wound Bed Granulation Amount: Medium (34-66%) Exposed Structure Granulation Quality: Red Fascia Exposed: No Necrotic Amount: Medium (34-66%) Fat Layer (Subcutaneous Tissue) Exposed: No Summerlin, Drema J. ([id number]) Necrotic Quality: Adherent Slough Tendon Exposed: No Muscle Exposed: No Joint Exposed: No Bone Exposed: No Limited to Skin Breakdown Periwound Skin Texture Texture Color No Abnormalities Noted: No No Abnormalities Noted: No Moisture Temperature / Pain No Abnormalities Noted: No Temperature: No Abnormality Tenderness on Palpation: Yes Wound Preparation Ulcer Cleansing: Rinsed/Irrigated with Saline Topical Anesthetic Applied: Other: lidocaine 4%, Treatment Notes Wound #3 (Left, Medial Lower Leg) 1. Cleansed with: Clean wound with Normal Saline 2. Anesthetic Topical Lidocaine 4% cream to wound bed prior to debridement 3. Peri-wound Care: Other peri-wound care (specify in  notes) 4. Dressing Applied: Prisma Ag 5. Secondary Dressing Applied ABD Pad Dry Gauze 7. Secured with Tape Notes kerlix, coban, unna to anchor, drawtex Electronic Signature(s) Signed: 02/09/2017 4:34:40 PM By: [name] Entered By: [name] on 02/09/2017 10:02:07 ALISE, CALAIS ([id number]) -------------------------------------------------------------------------------- Vitals Details Patient Name: Jordan Banda Date of Service: 02/09/2017 9:45 AM Medical Record Patient Account Number: [id number] Number: Treating RN: [name] 1931/06/13 (83 y.o. Other Clinician: Date of Birth/Sex: Female) Treating ROBSON, MICHAEL Primary Care Jaleel Allen: Guy Begin Aquila Delaughter/Extender: G Referring Annalyce Lanpher: Guy Begin Weeks in Treatment: 0 Vital Signs Time Taken: 09:38 Temperature (F): 97.5 Height (in): 66 Pulse (bpm): 72 Source: Stated Respiratory Rate (breaths/min): 16 Weight (lbs): 159 Blood Pressure (mmHg): 154/82 Source: Measured Reference Range: 80 - 120 mg / dl Body Mass Index (BMI): 25.7 Electronic Signature(s) Signed: 02/09/2017 4:34:40 PM By: [name] Entered By: [name] on 02/09/2017 09:40:25

## 2017-02-11 NOTE — Progress Notes (Addendum)
[name], [name] ([id number]) Visit Report for 02/09/2017 Chief Complaint Document Details [name], [name]. Date of Service: 02/09/2017 9:45 AM Medical Record Patient Account Number: [id number] Number: Treating RN: [name] Mar 02, 1931 (83 y.o. Other Clinician: Date of Birth/Sex: Female) Treating [name] Primary Care Hopkins: [name] Hopkins/Extender: [name]: [name] Weeks in Treatment: 0 Information Obtained from: Patient Chief Complaint 06/10/16; the patient is here for review of a wound on the right anterior medial lower leg above the ankle Electronic Signature(s) Signed: 02/10/2017 3:56:26 PM By: Jordan Najjar MD Entered By: [name] on 02/09/2017 10:29:40 Kopinski, [name] ([id number]) -------------------------------------------------------------------------------- Debridement Details [name] Date of Service: 02/09/2017 9:45 AM Medical Record Patient Account Number: [id number] Number: Treating RN: [name] 11-02-1931 (83 y.o. Other Clinician: Date of Birth/Sex: Female) Treating [name] Primary Care Hopkins: [name] Hopkins/Extender: [name]: [name] Weeks in Treatment: 0 Debridement Performed for Wound #2 Right,Lateral Malleolus Assessment: Performed By: Physician Jordan Caul, MD Debridement: Debridement Pre-procedure Yes - 10:10 Verification/Time Out Taken: Start Time: 10:11 Pain Control: Lidocaine 4% Topical Solution Level: Skin/Subcutaneous Tissue Total Area Debrided (L x 3.1 (cm) x 1.7 (cm) = 5.27 (cm) W): Tissue and other Viable, Non-Viable, Exudate, Fibrin/Slough, Subcutaneous material debrided: Instrument: Curette Bleeding: Minimum Hemostasis Achieved: Pressure End Time: 10:13 Procedural Pain: 0 Post Procedural Pain: 0 Response to Treatment: Procedure was tolerated well Post Debridement Measurements  of Total Wound Length: (cm) 3.1 Width: (cm) 1.7 Depth: (cm) 0.1 Volume: (cm) 0.414 Character of Wound/Ulcer Post Requires Further Debridement Debridement: Severity of Tissue Post Debridement: Fat layer exposed Post Procedure Diagnosis Same as Pre-procedure Electronic Signature(s) Signed: 02/09/2017 4:34:40 PM By: [name] Signed: 02/10/2017 3:56:26 PM By: Jordan Najjar MD [name] ([id number]) Entered By: [name] on 02/09/2017 10:29:25 [name] ([id number]) -------------------------------------------------------------------------------- HPI Details [name] Date of Service: 02/09/2017 9:45 AM Medical Record Patient Account Number: [id number] Number: Treating RN: [name] [birth year] (83 y.o. Other Clinician: Date of Birth/Sex: Female) Treating [name] Primary Care Hopkins: [name] Hopkins/Extender: [name]: [name] Weeks in Treatment: 0 History of Present Illness HPI Description: 06/10/16; this is an 83 year old woman who lives near [place] with family members. She saw her primary doctor roughly over a week ago and was referred to the ER for a cellulitis in the right medial leg. She was given a round of IV antibiotics in the emergency room and discharged on oral Keflex which she is taking. She is been left with a uncomfortable leg area on the right medial lower leg. Her ABIs in this clinic were 0.8 on the right 0.6 on the left. She does not describe claudication. She is not a diabetic. Lab work in the ER showed a normal BUN and creatinine on 6/20 at 18 and 0.92 respectively white count was 10.1 hemoglobin at 13.8. She did not have any imaging studies. I can see no vascular evaluations in Epic. She does not have a prior wound history 06/17/16; the area affected on her right medial ankle is an area I thought was due to tissue damage from cellulitis last week. She is going for  venous reflux studies this afternoon, these were not ordered in this clinic at least not by me. The patient does not give a prior history of damage to the skin in this area although I wonder if she actually might not of noticed it. We have been using's Aquacel  Ag under a wrap although we will not be able to wrap her today 06/24/16: pt reports today she hasn't heard from the vascular clinic regarding proceeding with arterial studies. our office will assist with this today. she denies s/s of infection. denies problems with her wraps. 07/07/16; since I last saw this woman she was admitted to hospital from 7/16 through 7/18 with sepsis syndrome felt to be secondary to her ulcer. She was treated with Vanco and Zosyn the hospital discharged on Septra. Blood cultures were negative. Urine culture showed multiple species she is now feeling well using Santyl with border foam to her wound. [facility] 07/14/16; no major improvement here. She has a quarter-sized wound covered with thick adherent slough on the lateral aspect of her right leg with several surrounding satellite lesions in a similar state. She has had 2 recent bouts of cellulitis, one before she came to this clinic and one required a recent hospitalization. I have asked for arterial studies I don't believe these have ever been done. My notes suggest from early in July that she went for reflux studies although I don't know that I have ever seen these either, she did have a DVT rule out but I did not order this, this did not show a DVT but did show a Baker's cyst 07/21/2016 -- the patient had an arterial duplex study done with waveform suggestive of right femoropopliteal disease. There is also small vessel disease bilaterally. Right ABI was in the normal range of 1.0 and a TBI was 0.74. Left ABI is abnormal at 0.69 with a TBI of 0.40. Three-vessel runoff on the right and a two-vessel runoff on the left. She has an appointment to see Dr.  Kirke Hopkins later today. 07/28/2016 -- was seen by [name] who reviewed her arterial studies with an ABI of 0.94 on the right and 0.69 on the left with normal right toe brachial index. Duplex showed diffuse atherosclerosis without discrete Sidney, Kierria J. ([id number]) stenosis and there was a three-vessel runoff on the right below the knee. Based on these studies and the location of the ulcer he thought it was venous commended she continue with wound care consults. 08/04/16; I note the patient's reasonably functional arterial supply which is fortunate. The wound bed appears to be smaller. Debrided of surface slough. She is been using Iodoflex 08/11/16: Only a very small open area remains. However it has some depth 08/18/16; small open area is smaller. Still open however. 08/25/16; the area has totally closed down and epithelialized. READMISSION 02/09/17; this is an 83 year old woman who is not a diabetic or smoker. She was in our clinic in the summer into mid-September with a wound on her right medial calf that. This eventually closed down. I thought this was mostly venous. She did have evidence of PAD and she was seen by [name]. At that point she had an ABI of 0.94 on the right 0.69 on the left with a normal right toe brachial index. It was felt that she had enough blood flow to close her wound and that happened. She was discharged with stockings although I don't get the sense that she really use them she tells me that roughly a week or 2 she developed swelling in her bilateral lower legs and then noted wounds on her right lateral malleolus and left medial calf. The on this she is not really sure how this happened. She is not having a lot of pain although she is not ambulatory  that much only in her home. Other than weight loss she has not noticed any other issues no specific claudication no chest pain. Electronic Signature(s) Signed: 02/10/2017 3:56:26 PM By: Jordan Najjar MD Entered By: [name] on 02/09/2017 10:33:02 [name], [name] ([id number]) -------------------------------------------------------------------------------- Physical Exam Details [name], [name]. Date of Service: 02/09/2017 9:45 AM Medical Record Patient Account Number: [id number] Number: Treating RN: [name] 05/22/1931 (83 y.o. Other Clinician: Date of Birth/Sex: Female) Treating Michon Kaczmarek Primary Care Hopkins: [name] Hopkins/Extender: [name]: [name] Weeks in Treatment: 0 Constitutional Patient is hypertensive.. Pulse regular and within target range for patient.Marland Kitchen Respirations regular, non-labored and within target range.. Temperature is normal and within the target range for the patient.. Patient's appearance is neat and clean. Appears in no acute distress. Well nourished and well developed.. Eyes Conjunctivae clear. No discharge.Marland Kitchen Respiratory Respiratory effort is easy and symmetric bilaterally. Rate is normal at rest and on room air.. Bilateral breath sounds are clear and equal in all lobes with no wheezes, rales or rhonchi.. Cardiovascular Heart rhythm and rate regular, without murmur or gallop.. Femoral femoral pulses are also difficult to feel but are definitely present. Pedal pulses are very difficult to feel. I could not feel her popliteal pulses. Edema is present in both legs right greater than left. Signs of chronic venous insufficiency bilaterally. Gastrointestinal (GI) Abdomen is soft and non-distended without masses or tenderness. Bowel sounds active in all quadrants.. Genitourinary (GU) Bladder without fullness, masses or tenderness.. Lymphatic Nonpalpable no popliteal or inguinal area. Psychiatric No evidence of depression, anxiety, or agitation. Calm, cooperative, and communicative. Appropriate interactions and affect.. Notes Wound exam; patient has a fairly substantive wound on the right lateral ankle. This  has nonviable surface which I debrided with a #5 curet. There was not much bleeding here. She had trouble with this secondary to discomfort. She has a smaller wound on the left medial calf which also has surface slough although I did not attempt to debridement this. She has significant surrounding venous insufficiency with hemosiderin deposition up to her mid calves. No evidence of cellulitis Electronic Signature(s) Signed: 02/10/2017 3:56:26 PM By: Jordan Najjar MD Entered By: [name] on 02/09/2017 10:35:33 [name], [name] ([id number]) -------------------------------------------------------------------------------- Physician Orders Details [name] Date of Service: 02/09/2017 9:45 AM Medical Record Patient Account Number: [id number] Number: Treating RN: [name] 09/02/1931 (83 y.o. Other Clinician: Date of Birth/Sex: Female) Treating Charlot Gouin Primary Care Hopkins: [name] Hopkins/Extender: [name]: [name] in Treatment: 0 Verbal / Phone Orders: Yes Clinician: Pinkerton, Debi Read Back and Verified: Yes Diagnosis Coding Wound Cleansing Wound #2 Right,Lateral Malleolus o Clean wound with Normal Saline. Wound #3 Left,Medial Lower Leg o Clean wound with Normal Saline. Anesthetic Wound #2 Right,Lateral Malleolus o Topical Lidocaine 4% cream applied to wound bed prior to debridement - clinic use only Wound #3 Left,Medial Lower Leg o Topical Lidocaine 4% cream applied to wound bed prior to debridement - clinic use only Skin Barriers/Peri-Wound Care Wound #2 Right,Lateral Malleolus o Triamcinolone Acetonide Ointment Wound #3 Left,Medial Lower Leg o Triamcinolone Acetonide Ointment Primary Wound Dressing Wound #2 Right,Lateral Malleolus o Prisma Ag Wound #3 Left,Medial Lower Leg o Prisma Ag Secondary Dressing Wound #2 Right,Lateral Malleolus o ABD pad o Dry Gauze o  Drawtex Delellis, Tarae J. ([id number]) Wound #3 Left,Medial Lower Leg o ABD pad o Dry Gauze o Drawtex Dressing Change Frequency Wound #2 Right,Lateral Malleolus o Change dressing every  week Wound #3 Left,Medial Lower Leg o Change dressing every week Follow-up Appointments Wound #2 Right,Lateral Malleolus o Return Appointment in 1 week. Wound #3 Left,Medial Lower Leg o Return Appointment in 1 week. Edema Control Wound #2 Right,Lateral Malleolus o Kerlix and Coban - Bilateral - unna to anchor Wound #3 Left,Medial Lower Leg o Kerlix and Coban - Bilateral - unna to anchor Additional Orders / Instructions Wound #2 Right,Lateral Malleolus o Increase protein intake. Wound #3 Left,Medial Lower Leg o Increase protein intake. Electronic Signature(s) Signed: 02/09/2017 4:34:40 PM By: [name] Signed: 02/10/2017 3:56:26 PM By: Jordan Najjar MD Entered By: [name] on 02/09/2017 10:45:12 Stupka, [name] ([id number]) -------------------------------------------------------------------------------- Problem List Details Patient Name: AYDE, RECORD. Date of Service: 02/09/2017 9:45 AM Medical Record Patient Account Number: [id number] Number: Treating RN: [name] 09-01-1931 (83 y.o. 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 Date of Service: 02/09/2017 9:45 AM Medical Record Patient Account Number: [id number] Number: Treating RN: [name] 1931-07-23 (83 y.o. Other Clinician: Date of Birth/Sex: Female) Treating Jemila Camille Primary Care Hopkins: [name] Hopkins/Extender: [name]: [name] Weeks in Treatment: 0 Subjective Chief Complaint Information obtained from Patient 06/10/16; the patient is here for review of a wound on the right anterior medial lower leg above the ankle History of Present Illness (HPI) 06/10/16; this is an 84 year old woman who lives near [place] with family members. She saw her primary doctor roughly over a week ago and was referred to the ER for a cellulitis in the right medial leg. She was given a round of IV antibiotics in the emergency room and discharged on oral Keflex which she is taking. She is been left with a uncomfortable leg area on the right medial lower leg. Her ABIs in this clinic were 0.8 on the right 0.6 on the left. She does not describe claudication. She is not a diabetic. Lab work in the ER showed a normal BUN and creatinine on 6/20 at 18 and 0.92 respectively white count was 10.1 hemoglobin at 13.8. She did not have any imaging studies. I can see no vascular evaluations in Epic. She does not have a prior wound history 06/17/16; the area affected on her right medial ankle is an area I thought was due to tissue damage from cellulitis last week. She is going for venous reflux studies this afternoon, these were not ordered in this clinic at least not by me. The patient does not give a prior history of damage to the skin in this area although I wonder if she actually might not of noticed it. We have  been using's Aquacel Ag under a wrap although we will not be able to wrap her today 06/24/16: pt reports today she hasn't heard from the vascular clinic regarding proceeding with arterial studies. our office will assist with this today. she denies s/s of infection. denies problems with her wraps.  07/07/16; since I last saw this woman she was admitted to hospital from 7/16 through 7/18 with sepsis syndrome felt to be secondary to her ulcer. She was treated with Vanco and Zosyn the hospital discharged on Septra. Blood cultures were negative. Urine culture showed multiple species she is now feeling well using Santyl with border foam to her wound. [facility] 07/14/16; no major improvement here. She has a quarter-sized wound covered with thick adherent slough on the lateral aspect of her right leg with several surrounding satellite lesions in a similar state. She has had 2 recent bouts of cellulitis, one before she came to this clinic and one required a recent hospitalization. I have asked for arterial studies I don't believe these have ever been done. My notes suggest from early in July that she went for reflux studies although I don't know that I have ever seen these either, she did have a DVT rule out but I did not order this, this did not show a DVT but did show a Baker's cyst 07/21/2016 -- the patient had an arterial duplex study done with waveform suggestive of right femoropopliteal disease. There is also small vessel disease bilaterally. Right ABI was in the normal range of 1.0 and a TBI Kerin, Iya J. ([id number]) was 0.74. Left ABI is abnormal at 0.69 with a TBI of 0.40. Three-vessel runoff on the right and a two-vessel runoff on the left. She has an appointment to see Dr. Kirke Corin later today. 07/28/2016 -- was seen by Dr. Kirke Corin who reviewed her arterial studies with an ABI of 0.94 on the right and 0.69 on the left with normal right toe brachial index. Duplex showed diffuse  atherosclerosis without discrete stenosis and there was a three-vessel runoff on the right below the knee. Based on these studies and the location of the ulcer he thought it was venous commended she continue with wound care consults. 08/04/16; I note the patient's reasonably functional arterial supply which is fortunate. The wound bed appears to be smaller. Debrided of surface slough. She is been using Iodoflex 08/11/16: Only a very small open area remains. However it has some depth 08/18/16; small open area is smaller. Still open however. 08/25/16; the area has totally closed down and epithelialized. READMISSION 02/09/17; this is an 83 year old woman who is not a diabetic or smoker. She was in our clinic in the summer into mid-September with a wound on her right medial calf that. This eventually closed down. I thought this was mostly venous. She did have evidence of PAD and she was seen by Dr. Kirke Corin. At that point she had an ABI of 0.94 on the right 0.69 on the left with a normal right toe brachial index. It was felt that she had enough blood flow to close her wound and that happened. She was discharged with stockings although I don't get the sense that she really use them she tells me that roughly a week or 2 she developed swelling in her bilateral lower legs and then noted wounds on her right lateral malleolus and left medial calf. The on this she is not really sure how this happened. She is not having a lot of pain although she is not ambulatory that much only in her home. Other than weight loss she has not noticed any other issues no specific claudication no chest pain. Wound History Patient presents with 2 open wounds that have been present for approximately 3 weeks. Patient has been treating wounds in the following  manner: gauze. Laboratory tests have not been performed in the last month. Patient reportedly has not tested positive for an antibiotic resistant organism. Patient reportedly  has not tested positive for osteomyelitis. Patient reportedly has had testing performed to evaluate circulation in the legs. Patient experiences the following problems associated with their wounds: swelling. Patient History Information obtained from Patient. Allergies sulfur Family History Cancer - Siblings, Diabetes - Child, Hypertension - Child, No family history of Heart Disease, Hereditary Spherocytosis, Kidney Disease, Lung Disease, Seizures, Stroke, Thyroid Problems, Tuberculosis. Social History Never smoker, Marital Status - Widowed, Alcohol Use - Never, Drug Use - No History, Caffeine Use - Never. [name], [name] ([id number]) Review of Systems (ROS) Constitutional Symptoms (General Health) The patient has no complaints or symptoms. Ear/Nose/Mouth/Throat The patient has no complaints or symptoms. Hematologic/Lymphatic The patient has no complaints or symptoms. Objective Constitutional Patient is hypertensive.. Pulse regular and within target range for patient.Marland Kitchen Respirations regular, non-labored and within target range.. Temperature is normal and within the target range for the patient.. Patient's appearance is neat and clean. Appears in no acute distress. Well nourished and well developed.. Vitals Time Taken: 9:38 AM, Height: 66 in, Source: Stated, Weight: 159 lbs, Source: Measured, BMI: 25.7, Temperature: 97.5 F, Pulse: 72 bpm, Respiratory Rate: 16 breaths/min, Blood Pressure: 154/82 mmHg. Eyes Conjunctivae clear. No discharge.Marland Kitchen Respiratory Respiratory effort is easy and symmetric bilaterally. Rate is normal at rest and on room air.. Bilateral breath sounds are clear and equal in all lobes with no wheezes, rales or rhonchi.. Cardiovascular Heart rhythm and rate regular, without murmur or gallop.. Femoral femoral pulses are also difficult to feel but are definitely present. Pedal pulses are very difficult to feel. I could not feel her popliteal pulses. Edema is present  in both legs right greater than left. Signs of chronic venous insufficiency bilaterally. Gastrointestinal (GI) Abdomen is soft and non-distended without masses or tenderness. Bowel sounds active in all quadrants.. Genitourinary (GU) Bladder without fullness, masses or tenderness.. Lymphatic Nonpalpable no popliteal or inguinal area. Psychiatric No evidence of depression, anxiety, or agitation. Calm, cooperative, and communicative. Appropriate interactions and affect.Vadie Principato, [name] ([id number]) General Notes: Wound exam; patient has a fairly substantive wound on the right lateral ankle. This has nonviable surface which I debrided with a #5 curet. There was not much bleeding here. She had trouble with this secondary to discomfort. She has a smaller wound on the left medial calf which also has surface slough although I did not attempt to debridement this. She has significant surrounding venous insufficiency with hemosiderin deposition up to her mid calves. No evidence of cellulitis Integumentary (Hair, Skin) Wound #2 status is Open. Original cause of wound was Gradually Appeared. The wound is located on the Right,Lateral Malleolus. The wound measures 3.1cm length x 1.7cm width x 0.1cm depth; 4.139cm^2 area and 0.414cm^3 volume. The wound is limited to skin breakdown. There is no tunneling or undermining noted. There is a large amount of serosanguineous drainage noted. The wound margin is distinct with the outline attached to the wound base. There is no granulation within the wound bed. There is a large (67- 100%) amount of necrotic tissue within the wound bed including Adherent Slough. Periwound temperature was noted as No Abnormality. The periwound has tenderness on palpation. Wound #3 status is Open. Original cause of wound was Gradually Appeared. The wound is located on the Left,Medial Lower Leg. The wound measures 1cm length x 0.5cm width x 0.1cm depth; 0.393cm^2 area and 0.039cm^3  volume. The  wound is limited to skin breakdown. There is no tunneling or undermining noted. There is a large amount of serosanguineous drainage noted. The wound margin is distinct with the outline attached to the wound base. There is medium (34-66%) red granulation within the wound bed. There is a medium (34-66%) amount of necrotic tissue within the wound bed including Adherent Slough. Periwound temperature was noted as No Abnormality. The periwound has tenderness on palpation. 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 There was a Skin/Subcutaneous Tissue Debridement ([id number]) debridement with total area of 5.27 sq cm performed by Jordan Caul, MD. with the following instrument(s): Curette to remove Viable and Non-Viable tissue/material including Exudate, Fibrin/Slough, and Subcutaneous after achieving pain control using Lidocaine 4% Topical Solution. A time out was conducted at 10:10, prior to the start of the procedure. A Minimum amount of bleeding was controlled with Pressure. The procedure was tolerated well with a pain level of 0 throughout and a pain level of 0 following the procedure. Post Debridement Measurements: 3.1cm length x 1.7cm width Mayeux, Darcell J. ([id number]) x 0.1cm depth; 0.414cm^3 volume. Character of Wound/Ulcer Post Debridement requires further debridement. Severity of Tissue Post Debridement is: Fat layer exposed. Post procedure Diagnosis Wound #2: Same as Pre-Procedure Plan Wound Cleansing: Wound #2 Right,Lateral Malleolus: Clean wound with Normal Saline. Wound #3 Left,Medial Lower Leg: Clean wound with Normal  Saline. Anesthetic: Wound #2 Right,Lateral Malleolus: Topical Lidocaine 4% cream applied to wound bed prior to debridement - clinic use only Wound #3 Left,Medial Lower Leg: Topical Lidocaine 4% cream applied to wound bed prior to debridement - clinic use only Skin Barriers/Peri-Wound Care: Wound #2 Right,Lateral Malleolus: Triamcinolone Acetonide Ointment Wound #3 Left,Medial Lower Leg: Triamcinolone Acetonide Ointment Primary Wound Dressing: Wound #2 Right,Lateral Malleolus: Prisma Ag Wound #3 Left,Medial Lower Leg: Prisma Ag Secondary Dressing: Wound #2 Right,Lateral Malleolus: ABD pad Dry Gauze Drawtex Wound #3 Left,Medial Lower Leg: ABD pad Dry Gauze Drawtex Dressing Change Frequency: Wound #2 Right,Lateral Malleolus: Change dressing every week Wound #3 Left,Medial Lower Leg: Change dressing every week Follow-up Appointments: Wound #2 Right,Lateral Malleolus: Return Appointment in 1 week. Wound #3 Left,Medial Lower Leg: [name], MATSUOKA. ([id number]) Return Appointment in 1 week. Edema Control: Wound #2 Right,Lateral Malleolus: Kerlix and Coban - Bilateral - unna to anchor Wound #3 Left,Medial Lower Leg: Kerlix and Coban - Bilateral - unna to anchor Additional Orders / Instructions: Wound #2 Right,Lateral Malleolus: Increase protein intake. Wound #3 Left,Medial Lower Leg: Increase protein intake. #1 her original wound on the right medial leg is closed #2 the wound on the right lateral ankle required debridement. This may be predominantly venous insufficiency which seems suggestive by the history [increase edema] she has a small punched out area on the left medial leg and of the calf. Her ABI on the left today is 0.56. I am suspicious that she is going to need to go back to see Dr. Kirke Corin although we will see how this does in the next week before ordering this. #3 Prisma Curlex and lite cobanto both legs. I would like to leave this intact. Electronic  Signature(s) Signed: 02/15/2017 4:00:08 PM By: Elliot Gurney RN, BSN, Kim RN, BSN Signed: 02/17/2017 8:03:18 AM By: Leanord Hawking,  Casimiro Needle MD Previous Signature: 02/10/2017 3:56:26 PM Version By: Jordan Najjar MD Entered By: Elliot Gurney RN, BSN, Kim on 02/15/2017 16:00:08 [name], [name] ([id number]) -------------------------------------------------------------------------------- ROS/PFSH Details Patient Name: ERILYN, PEARMAN. Date of Service: 02/09/2017 9:45 AM Medical Record Patient Account Number: [id number] Number: Treating RN: [name] Jun 13, 1931 (83 y.o. Other Clinician: Date of Birth/Sex: Female) Treating Anjelina Dung Primary Care Hopkins: [name] Hopkins/Extender: [name]: [name] Weeks in Treatment: 0 Information Obtained From Patient Wound History Do you currently have one or more open woundso Yes How many open wounds do you currently haveo 2 Approximately how long have you had your woundso 3 weeks How have you been treating your wound(s) until nowo gauze Has your wound(s) ever healed and then re-openedo No Have you had any lab work done in the past montho No Have you tested positive for an antibiotic resistant organism (MRSA, VRE)o No Have you tested positive for osteomyelitis (bone infection)o No Have you had any tests for circulation on your legso Yes Where [facility] the test [facility] Have you had other problems associated with your woundso Swelling Constitutional Symptoms (General Health) Complaints and Symptoms: No Complaints or Symptoms Eyes Medical History: Positive for: Cataracts Ear/Nose/Mouth/Throat Complaints and Symptoms: No Complaints or Symptoms Medical History: Negative for: Chronic sinus problems/congestion; Middle ear problems Hematologic/Lymphatic Complaints and Symptoms: No Complaints or Symptoms Medical HistoryRASHAUN, [name]. ([id number]) Negative for: Anemia; Hemophilia; Human Immunodeficiency Virus; Lymphedema;  Sickle Cell Disease Respiratory Medical History: Negative for: Aspiration; Asthma; Chronic Obstructive Pulmonary Disease (COPD); Pneumothorax; Sleep Apnea; Tuberculosis Cardiovascular Medical History: Positive for: Arrhythmia; Hypertension Negative for: Angina Gastrointestinal Medical History: Negative for: Cirrhosis ; Colitis; Crohnos; Hepatitis A; Hepatitis B; Hepatitis C Endocrine Medical History: Negative for: Type I Diabetes; Type II Diabetes Genitourinary Medical History: Negative for: End Stage Renal Disease Immunological Medical History: Negative for: Lupus Erythematosus; Raynaudos; Scleroderma Integumentary (Skin) Medical History: Negative for: History of Burn; History of pressure wounds Musculoskeletal Medical History: Positive for: Osteoarthritis Neurologic Medical History: Negative for: Dementia; Neuropathy; Quadriplegia; Paraplegia; Seizure Disorder Oncologic [name], [name] ([id number]) Medical History: Negative for: Received Chemotherapy; Received Radiation Psychiatric Medical History: Negative for: Anorexia/bulimia; Confinement Anxiety HBO Extended History Items Eyes: Cataracts Immunizations Pneumococcal Vaccine: Received Pneumococcal Vaccination: Yes Family and Social History Cancer: Yes - Siblings; Diabetes: Yes - Child; Heart Disease: No; Hereditary Spherocytosis: No; Hypertension: Yes - Child; Kidney Disease: No; Lung Disease: No; Seizures: No; Stroke: No; Thyroid Problems: No; Tuberculosis: No; Never smoker; Marital Status - Widowed; Alcohol Use: Never; Drug Use: No History; Caffeine Use: Never; Financial Concerns: No; Food, Clothing or Shelter Needs: No; Support System Lacking: No; Transportation Concerns: No; Advanced Directives: No; Patient does not want information on Advanced Directives; Living Will: Yes (Not Provided); Medical Power of Attorney: No Electronic Signature(s) Signed: 02/09/2017 4:34:40 PM By: [name] Signed: 02/10/2017  3:56:26 PM By: Jordan Najjar MD Entered By: [name] on 02/09/2017 09:43:03 Turrubiates, [name] ([id number]) -------------------------------------------------------------------------------- SuperBill Details [name] Date of Service: 02/09/2017 Medical Record Patient Account Number: [id number] Number: Treating RN: [name] 1931-06-29 (83 y.o. 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 L97.221 Non-pressure chronic ulcer of left calf limited to breakdown  of skin Facility Procedures CPT4: Description Modifier Quantity Code [id number] 99213 - WOUND CARE VISIT-LEV 3 EST PT 1 CPT4: 54098119 11042 - DEB SUBQ TISSUE 20 SQ CM/< 1 ICD-10 Description Diagnosis I87.333 Chronic venous hypertension (idiopathic) with ulcer and inflammation of bilateral lower extremity L97.313 Non-pressure chronic ulcer of right ankle with necrosis of  muscle Physician Procedures CPT4: Description Modifier Quantity Code [id number] 99214 - WC PHYS LEVEL 4 - EST PT 25 1 ICD-10 Description Diagnosis I87.333 Chronic venous hypertension (idiopathic) with ulcer and inflammation of bilateral lower extremity L97.313 Non-pressure chronic  ulcer of right ankle with necrosis of muscle L97.221 Non-pressure chronic ulcer of left calf limited to breakdown of skin CPT4: 6213086 11042 - WC PHYS SUBQ TISS 20 SQ CM 1 ICD-10 Description Diagnosis LIDYA, MCCALISTER ([id number]) Electronic Signature(s) Signed: 02/09/2017 4:34:40 PM By: [name] Signed: 02/10/2017 3:56:26 PM By: Jordan Najjar MD Entered  By: [name] on 02/09/2017 10:46:12

## 2017-02-16 ENCOUNTER — Encounter: Payer: Medicare Other | Attending: Internal Medicine | Admitting: Internal Medicine

## 2017-02-16 DIAGNOSIS — M1990 Unspecified osteoarthritis, unspecified site: Secondary | ICD-10-CM | POA: Insufficient documentation

## 2017-02-16 DIAGNOSIS — I10 Essential (primary) hypertension: Secondary | ICD-10-CM | POA: Diagnosis not present

## 2017-02-16 DIAGNOSIS — L97313 Non-pressure chronic ulcer of right ankle with necrosis of muscle: Secondary | ICD-10-CM | POA: Diagnosis not present

## 2017-02-16 DIAGNOSIS — L97221 Non-pressure chronic ulcer of left calf limited to breakdown of skin: Secondary | ICD-10-CM | POA: Insufficient documentation

## 2017-02-16 DIAGNOSIS — I87333 Chronic venous hypertension (idiopathic) with ulcer and inflammation of bilateral lower extremity: Secondary | ICD-10-CM | POA: Diagnosis not present

## 2017-02-17 NOTE — Progress Notes (Addendum)
[name] ([id number]) Visit Report for Hopkins Arrival Information Details [name], [name] 1:30 PM Medical Record Patient Account Number: [id number] Number: Treating RN: [name] 1931/04/17 (83 y.o. Other Clinician: Date of Birth/Sex: Female) Treating ROBSON, MICHAEL Primary Care Stefan Markarian: Guy Begin Versie Soave/Extender: G Referring Zeyad Delaguila: [name] in Treatment: 1 Visit Information History Since Last Visit All ordered tests and consults were completed: No Patient Arrived: Wheel Chair Added or deleted any medications: No Arrival Time: 14:01 Any new allergies or adverse reactions: No Accompanied By: daughter Had a fall or experienced change in No Transfer Assistance: Easy[employer] activities of daily living that may affect Patient Lift risk of falls: Patient Identification Verified: Yes Signs or symptoms of abuse/neglect since last No Secondary Verification Process Yes visito Completed: Hospitalized since last visit: No Patient Requires Transmission- No Has Dressing in Place as Prescribed: Yes Based Precautions: Has Compression in Place as Prescribed: Yes Patient Has Alerts: No Pain Present Now: Yes Electronic Signature(s) Signed: 02/16/2017 3:56:24 PM By: [name] Entered By: [name] on 02/16/2017 14:01:46 [name] ([id number]) -------------------------------------------------------------------------------- Clinic Level of Care Assessment Details [name] Date of Service: Hopkins 1:30 PM Medical Record Patient Account Number: [id number] Number: Treating RN: [name] 04-21-1931 (83 y.o. Other Clinician: Date of Birth/Sex: Female) Treating ROBSON, MICHAEL Primary Care Martin Smeal: Guy Begin Lochlyn Zullo/Extender: G Referring Falon Huesca: [name] in Treatment: 1 Clinic Level of Care Assessment Items TOOL 3 Quantity  Score X - Use when EandM and Procedure is performed on FOLLOW-UP visit 1 0 ASSESSMENTS - Nursing Assessment / Reassessment X - Reassessment of Co-morbidities (includes updates in patient status) 1 10 X - Reassessment of Adherence to Treatment Plan 1 5 ASSESSMENTS - Wound and Skin Assessment / Reassessment []  - Points for Wound Assessment can only be taken for a new wound of unknown 0 or different etiology and a procedure is NOT performed to that wound []  - Simple Wound Assessment / Reassessment - one wound 0 X - Complex Wound Assessment / Reassessment - multiple wounds 3 5 []  - Dermatologic / Skin Assessment (not related to wound area) 0 ASSESSMENTS - Focused Assessment X - Circumferential Edema Measurements - multi extremities 2 5 []  - Nutritional Assessment / Counseling / Intervention 0 []  - Lower Extremity Assessment (monofilament, tuning fork, pulses) 0 []  - Peripheral Arterial Disease Assessment (using hand held doppler) 0 ASSESSMENTS - Ostomy and/or Continence Assessment and Care []  - Incontinence Assessment and Management 0 []  - Ostomy Care Assessment and Management (repouching, etc.) 0 PROCESS - Coordination of Care []  - Points for Discharge Coordination can only be taken for a new wound of 0 unknown or different etiology and a procedure is NOT performed to that wound X - Simple Patient / Family Education for ongoing care 1 15 [name], [name] ([id number]) []  - Complex (extensive) Patient / Family Education for ongoing care 0 []  - Staff obtains Chiropractor, Records, Test Results / Process Orders 0 []  - Staff telephones HHA, Nursing Homes / Clarify orders / etc 0 []  - Routine Transfer to another Facility (non-emergent condition) 0 []  - Routine Hospital Admission (non-emergent condition) 0 []  - New Admissions / Manufacturing engineer / Ordering NPWT, Apligraf, etc. 0 []  - Emergency Hospital Admission (emergent condition) 0 X - Simple Discharge Coordination 1 10 []  - Complex  (extensive) Discharge Coordination 0 PROCESS - Special Needs []  - Pediatric / Minor Patient Management 0 []  - Isolation Patient  Management 0 []  - Hearing / Language / Visual special needs 0 []  - Assessment of Community assistance (transportation, D/C planning, etc.) 0 []  - Additional assistance / Altered mentation 0 []  - Support Surface(s) Assessment (bed, cushion, seat, etc.) 0 INTERVENTIONS - Wound Cleansing / Measurement []  - Points for Wound Cleaning / Measurement, Wound Dressing, Specimen 0 Collection and Specimen taken to lab can only be taken for a new wound of unknown or different etiology and a procedure is NOT performed to that wound []  - Simple Wound Cleansing - one wound 0 X - Complex Wound Cleansing - multiple wounds 3 5 X - Wound Imaging (photographs - any number of wounds) 1 5 []  - Wound Tracing (instead of photographs) 0 []  - Simple Wound Measurement - one wound 0 X - Complex Wound Measurement - multiple wounds 3 5 INTERVENTIONS - Wound Dressings [name], [name] ([id number]) []  - Small Wound Dressing one or multiple wounds 0 []  - Medium Wound Dressing one or multiple wounds 0 X - Large Wound Dressing one or multiple wounds 2 20 INTERVENTIONS - Miscellaneous []  - External ear exam 0 []  - Specimen Collection (cultures, biopsies, blood, body fluids, etc.) 0 []  - Specimen(s) / Culture(s) sent or taken to Lab for analysis 0 []  - Patient Transfer (multiple staff / Michiel Sites Lift / Similar devices) 0 []  - Simple Staple / Suture removal (25 or less) 0 []  - Complex Staple / Suture removal (26 or more) 0 []  - Hypo / Hyperglycemic Management (close monitor of Blood Glucose) 0 []  - Ankle / Brachial Index (ABI) - do not check if billed separately 0 X - Vital Signs 1 5 Has the patient been seen at the hospital within the last three years: Yes Total Score: 145 Level Of Care: New/Established - Level 4 Electronic Signature(s) Signed: 02/16/2017 3:56:24 PM By: [name] Entered  By: [name] on 02/16/2017 15:52:48 [name], [name] ([id number]) -------------------------------------------------------------------------------- Encounter Discharge Information Details [name] Date of Service: Hopkins 1:30 PM Medical Record Patient Account Number: [id number] Number: Treating RN: [name] 09-29-1931 (83 y.o. Other Clinician: Date of Birth/Sex: Female) Treating ROBSON, MICHAEL Primary Care Natalyah Cummiskey: Guy Begin Iysis Germain/Extender: G Referring Graciella Arment: [name] in Treatment: 1 Encounter Discharge Information Items Discharge Pain Level: 0 Discharge Condition: Stable Ambulatory Status: Wheelchair Discharge Destination: Home Transportation: Private Auto Accompanied By: daughter Schedule Follow-up Appointment: Yes Medication Reconciliation completed No and provided to Patient/Care Jeannene Tschetter: Provided on Clinical Summary of Care: Hopkins Form Type Recipient Paper Patient CF Electronic Signature(s) Signed: 02/16/2017 2:51:00 PM By: Gwenlyn Perking Entered By: Gwenlyn Perking on 02/16/2017 14:51:00 Hudlow, [name] ([id number]) -------------------------------------------------------------------------------- Lower Extremity Assessment Details [name] Date of Service: Hopkins 1:30 PM Medical Record Patient Account Number: [id number] Number: Treating RN: [name] 04/14/1931 (83 y.o. Other Clinician: Date of Birth/Sex: Female) Treating ROBSON, MICHAEL Primary Care Terah Robey: Guy Begin Monik Lins/Extender: G Referring Akua Blethen: Guy Begin Weeks in Treatment: 1 Edema Assessment Assessed: [Left: No] [Right: No] E[Left: dema] [Right: :] Calf Left: Right: Point of Measurement: 34 cm From Medial Instep 31.6 cm 35.2 cm Ankle Left: Right: Point of Measurement: 9 cm From Medial Instep 23.5 cm 26.2 cm Vascular Assessment Pulses: Dorsalis Pedis Palpable:  [Left:No] [Right:No] Posterior Tibial Extremity colors, hair growth, and conditions: Extremity Color: [Left:Hyperpigmented] [Right:Hyperpigmented] Temperature of Extremity: [Left:Warm] [Right:Warm] Capillary Refill: [Left:< 3 seconds] [Right:< 3 seconds] Electronic Signature(s) Signed: 02/16/2017 3:56:24 PM By: [name] Entered By: [name] on 02/16/2017 14:14:47  [name], [name] ([id number]) -------------------------------------------------------------------------------- Multi Wound Chart Details Patient Name: BELKY, MUNDO. Date of Service: Hopkins 1:30 PM Medical Record Patient Account Number: [id number] Number: Treating RN: [name] 1931/12/02 (83 y.o. Other Clinician: Date of Birth/Sex: Female) Treating ROBSON, MICHAEL Primary Care Jonny Dearden: Guy Begin Dorthea Maina/Extender: G Referring Haris Baack: Guy Begin Weeks in Treatment: 1 Vital Signs Height(in): 66 Pulse(bpm): 74 Weight(lbs): 159 Blood Pressure 152/80 (mmHg): Body Mass Index(BMI): 26 Temperature(F): 97.5 Respiratory Rate 16 (breaths/min): Photos: Wound Location: Right Malleolus - Lateral Left Lower Leg - Medial Right Lower Leg - Medial Wounding Event: Gradually Appeared Gradually Appeared Gradually Appeared Primary Etiology: Venous Leg Ulcer Venous Leg Ulcer Venous Leg Ulcer Comorbid History: Cataracts, Arrhythmia, Cataracts, Arrhythmia, Cataracts, Arrhythmia, Hypertension, Hypertension, Hypertension, Osteoarthritis Osteoarthritis Osteoarthritis Date Acquired: 01/19/2017 01/19/2017 Hopkins Weeks of Treatment: 1 1 0 Wound Status: Open Open Open Measurements L x W x D 3.2x1.7x0.1 2.5x0.9x0.1 0.5x0.4x0.1 (cm) Area (cm) : 4.273 1.767 0.157 Volume (cm) : 0.427 0.177 0.016 % Reduction in Area: -3.20% -349.60% N/A % Reduction in Volume: -3.10% -353.80% N/A Classification: Partial Thickness Partial Thickness Partial Thickness Exudate Amount: Large Large Large Exudate  Type: Serosanguineous Serous Serous Exudate Color: red, brown amber amber Wound Margin: Distinct, outline attached Distinct, outline attached Distinct, outline attached Granulation Amount: None Present (0%) None Present (0%) None Present (0%) Strozier, Su J. ([id number]) Necrotic Amount: Large (67-100%) Large (67-100%) Large (67-100%) Necrotic Tissue: Eschar, Adherent Slough Adherent [employer] Exposed Structures: Fascia: No Fascia: No Fascia: No Fat Layer (Subcutaneous Fat Layer (Subcutaneous Fat Layer (Subcutaneous Tissue) Exposed: No Tissue) Exposed: No Tissue) Exposed: No Tendon: No Tendon: No Tendon: No Muscle: No Muscle: No Muscle: No Joint: No Joint: No Joint: No Bone: No Bone: No Bone: No Limited to Skin Limited to Skin Limited to Skin Breakdown Breakdown Breakdown Epithelialization: None None None Debridement: Debridement ([id number] Debridement ([id number] N/A 11047) 11047) Pre-procedure 14:28 14:28 N/A Verification/Time Out Taken: Pain Control: Lidocaine 4% Topical Lidocaine 4% Topical N/A Solution Solution Tissue Debrided: Fibrin/Slough, Exudates, Fibrin/Slough, Exudates, N/A Subcutaneous Subcutaneous Level: Skin/Subcutaneous Skin/Subcutaneous N/A Tissue Tissue Debridement Area (sq 5.44 2.25 N/A cm): Instrument: Curette Curette N/A Bleeding: Minimum Minimum N/A Hemostasis Achieved: Pressure Pressure N/A Procedural Pain: 0 0 N/A Post Procedural Pain: 0 0 N/A Debridement Treatment Procedure was tolerated Procedure was tolerated N/A Response: well well Post Debridement 3.2x1.7x0.2 2.5x0.9x0.1 N/A Measurements L x W x D (cm) Post Debridement 0.855 0.177 N/A Volume: (cm) Periwound Skin Texture: No Abnormalities Noted No Abnormalities Noted No Abnormalities Noted Periwound Skin No Abnormalities Noted No Abnormalities Noted No Abnormalities Noted Moisture: Periwound Skin Color: No Abnormalities Noted No Abnormalities Noted No Abnormalities  Noted Temperature: No Abnormality No Abnormality No Abnormality Tenderness on Yes Yes Yes Palpation: Wound Preparation: Ulcer Cleansing: Ulcer Cleansing: Ulcer Cleansing: Rinsed/Irrigated with Rinsed/Irrigated with Rinsed/Irrigated with Saline Saline Saline Topical Anesthetic Topical Anesthetic Topical Anesthetic TONNYA, GARBETT. ([id number]) Applied: Other: lidocaine Applied: Other: lidocaine Applied: Other: lidocaine 4% 4% 4% Procedures Performed: Debridement Debridement N/A Treatment Notes Wound #2 (Right, Lateral Malleolus) 1. Cleansed with: Clean wound with Normal Saline Cleanse wound with antibacterial soap and water 2. Anesthetic Topical Lidocaine 4% cream to wound bed prior to debridement 4. Dressing Applied: Santyl Ointment 5. Secondary Dressing Applied ABD Pad Dry Gauze 7. Secured with Tape Notes kerlix. coban, tca, drawtex Wound #3 (Left, Medial Lower Leg) 1. Cleansed with: Clean wound with Normal Saline Cleanse wound with antibacterial soap and water 2. Anesthetic Topical Lidocaine 4% cream to wound bed prior  to debridement 4. Dressing Applied: Santyl Ointment 5. Secondary Dressing Applied ABD Pad Dry Gauze 7. Secured with Tape Notes kerlix. coban, tca, drawtex Wound #4 (Right, Medial Lower Leg) 1. Cleansed with: Clean wound with Normal Saline Cleanse wound with antibacterial soap and water 2. Anesthetic Topical Lidocaine 4% cream to wound bed prior to debridement 4. Dressing Applied: Aquacel Ag 5. Secondary Dressing Applied MAHIMA, HOTTLE J. ([id number]) ABD Pad Dry Gauze 7. Secured with Tape Notes kerlix. Claudie Leach, drawtex Electronic Signature(s) Signed: 02/17/2017 8:02:19 AM By: Baltazar Najjar MD Previous Signature: Hopkins 3:56:24 PM Version By: [name] Entered By: Baltazar Najjar on 02/17/2017 07:48:39 Loewe, [name]  ([id number]) -------------------------------------------------------------------------------- Multi-Disciplinary Care Plan Details [name], [name]. Date of Service: Hopkins 1:30 PM Medical Record Patient Account Number: [id number] Number: Treating RN: [name] 21-Dec-1930 (83 y.o. Other Clinician: Date of Birth/Sex: Female) Treating ROBSON, MICHAEL Primary Care Correen Bubolz: Guy Begin Rebie Peale/Extender: G Referring Arvell Pulsifer: [name] in Treatment: 1 Active Inactive ` Abuse / Safety / Falls / Self Care Management Nursing Diagnoses: Potential for falls Goals: Patient will remain injury free Date Initiated: 02/09/2017 Target Resolution Date: 04/17/2017 Goal Status: Active Interventions: Assess fall risk on admission and as needed Assess self care needs on admission and as needed Notes: ` Nutrition Nursing Diagnoses: Imbalanced nutrition Goals: Patient/caregiver agrees to and verbalizes understanding of need to use nutritional supplements and/or vitamins as prescribed Date Initiated: 02/09/2017 Target Resolution Date: 04/17/2017 Goal Status: Active Interventions: Assess patient nutrition upon admission and as needed per policy Notes: ` Orientation to the Wound Care Program DUSTI, TETRO ([id number]) Nursing Diagnoses: Knowledge deficit related to the wound healing center program Goals: Patient/caregiver will verbalize understanding of the Wound Healing Center Program Date Initiated: 02/09/2017 Target Resolution Date: 02/20/2017 Goal Status: Active Interventions: Provide education on orientation to the wound center Notes: ` Pain, Acute or Chronic Nursing Diagnoses: Pain, acute or chronic: actual or potential Potential alteration in comfort, pain Goals: Patient/caregiver will verbalize adequate pain control between visits Date Initiated: 02/09/2017 Target Resolution Date: 04/17/2017 Goal Status: Active Interventions: Assess  comfort goal upon admission Complete pain assessment as per visit requirements Notes: ` Wound/Skin Impairment Nursing Diagnoses: Impaired tissue integrity Knowledge deficit related to smoking impact on wound healing Knowledge deficit related to ulceration/compromised skin integrity Goals: Ulcer/skin breakdown will have a volume reduction of 80% by week 12 Date Initiated: 02/09/2017 Target Resolution Date: 04/10/2017 Goal Status: Active Interventions: Assess patient/caregiver ability to perform ulcer/skin care regimen upon admission and as needed [name], [name] ([id number]) Assess ulceration(s) every visit Notes: Electronic Signature(s) Signed: 02/16/2017 3:56:24 PM By: [name] Entered By: [name] on 02/16/2017 14:27:28 Rude, [name] ([id number]) -------------------------------------------------------------------------------- Pain Assessment Details [name] Date of Service: Hopkins 1:30 PM Medical Record Patient Account Number: [id number] Number: Treating RN: [name] 05/03/1931 (83 y.o. Other Clinician: Date of Birth/Sex: Female) Treating ROBSON, MICHAEL Primary Care Jazelyn Sipe: Guy Begin Plato Alspaugh/Extender: G Referring Ciella Obi: Guy Begin Weeks in Treatment: 1 Active Problems Location of Pain Severity and Description of Pain Patient Has Paino Yes Site Locations Pain Location: Pain in Ulcers With Dressing Change: Yes Rate the pain. Current Pain Level: 9 Character of Pain Describe the Pain: Aching Pain Management and Medication Current Pain Management: Electronic Signature(s) Signed: 02/16/2017 3:56:24 PM By: [name] Entered By: [name] on 02/16/2017 14:02:09 Weigand, [name] ([id number]) -------------------------------------------------------------------------------- Patient/Caregiver Education Details [name] Date of Service: [facility] 1:30 PM Medical Record  Patient Account Number: [id number] Number: Treating RN: [name] Sep 22, 1931 (83 y.o. Other Clinician: Date of Birth/Gender: Female) Treating ROBSON, MICHAEL Primary Care Physician: Guy Begin Physician/Extender: G Referring Physician: [name] in Treatment: 1 Education Assessment Education Provided To: Patient Education Topics Provided Wound/Skin Impairment: Handouts: Other: change dressing as ordered Methods: Demonstration, Explain/Verbal Responses: State content correctly Electronic Signature(s) Signed: 02/16/2017 3:56:24 PM By: [name] Entered By: [name] on 02/16/2017 14:18:29 Kipp, Helia Shela Hopkins ([id number]) -------------------------------------------------------------------------------- Wound Assessment Details [name] Date of Service: [facility] 1:30 PM Medical Record Patient Account Number: [id number] Number: Treating RN: [name] [birth year] (83 y.o. Other Clinician: Date of Birth/Sex: Female) Treating ROBSON, MICHAEL Primary Care Tandra Rosado: Guy Begin Tommaso Cavitt/Extender: G Referring Antwine Agosto: Guy Begin Weeks in Treatment: 1 Wound Status Wound Number: 2 Primary Venous Leg Ulcer Etiology: Wound Location: Right Malleolus - Lateral Wound Status: Open Wounding Event: Gradually Appeared Comorbid Cataracts, Arrhythmia, Hypertension, Date Acquired: 01/19/2017 History: Osteoarthritis Weeks Of Treatment: 1 Clustered Wound: No Photos Photo Uploaded By: [name] on 02/16/2017 15:44:09 Wound Measurements Length: (cm) 3.2 Width: (cm) 1.7 Depth: (cm) 0.1 Area: (cm) 4.273 Volume: (cm) 0.427 % Reduction in Area: -3.2% % Reduction in Volume: -3.1% Epithelialization: None Tunneling: No Undermining: No Wound Description Classification: Partial Thickness Foul Odor Afte Wound Margin: Distinct, outline attached Slough/Fibrino Exudate Amount: Large Exudate Type:  Serosanguineous Exudate Color: red, brown r Cleansing: No Yes Wound Bed Granulation Amount: None Present (0%) Exposed Structure Necrotic Amount: Large (67-100%) Fascia Exposed: No Necrotic Quality: Eschar, Adherent Slough Fat Layer (Subcutaneous Tissue) Exposed: No Andres, Sheritha J. ([id number]) Tendon Exposed: No Muscle Exposed: No Joint Exposed: No Bone Exposed: No Limited to Skin Breakdown Periwound Skin Texture Texture Color No Abnormalities Noted: No No Abnormalities Noted: No Moisture Temperature / Pain No Abnormalities Noted: No Temperature: No Abnormality Tenderness on Palpation: Yes Wound Preparation Ulcer Cleansing: Rinsed/Irrigated with Saline Topical Anesthetic Applied: Other: lidocaine 4%, Treatment Notes Wound #2 (Right, Lateral Malleolus) 1. Cleansed with: Clean wound with Normal Saline Cleanse wound with antibacterial soap and water 2. Anesthetic Topical Lidocaine 4% cream to wound bed prior to debridement 4. Dressing Applied: Santyl Ointment 5. Secondary Dressing Applied ABD Pad Dry Gauze 7. Secured with Tape Notes kerlix. Claudie Leach, drawtex Electronic Signature(s) Signed: 02/16/2017 3:56:24 PM By: [name] Entered By: [name] on 02/16/2017 14:11:02 [name], [name] ([id number]) -------------------------------------------------------------------------------- Wound Assessment Details [name] Date of Service: Hopkins 1:30 PM Medical Record Patient Account Number: [id number] Number: Treating RN: [name] 03/14/1931 (83 y.o. Other Clinician: Date of Birth/Sex: Female) Treating ROBSON, MICHAEL Primary Care Hira Trent: Guy Begin Maribeth Jiles/Extender: G Referring Bryse Blanchette: Guy Begin Weeks in Treatment: 1 Wound Status Wound Number: 3 Primary Venous Leg Ulcer Etiology: Wound Location: Left Lower Leg - Medial Wound Status: Open Wounding Event: Gradually Appeared Comorbid Cataracts,  Arrhythmia, Hypertension, Date Acquired: 01/19/2017 History: Osteoarthritis Weeks Of Treatment: 1 Clustered Wound: No Photos Photo Uploaded By: [name] on 02/16/2017 15:44:36 Wound Measurements Length: (cm) 2.5 Width: (cm) 0.9 Depth: (cm) 0.1 Area: (cm) 1.767 Volume: (cm) 0.177 % Reduction in Area: -349.6% % Reduction in Volume: -353.8% Epithelialization: None Tunneling: No Undermining: No Wound Description Classification: Partial Thickness Foul Odor Afte Wound Margin: Distinct, outline attached Slough/Fibrino Exudate Amount: Large Exudate Type: Serous Exudate Color: amber r Cleansing: No No Wound Bed Granulation Amount: None Present (0%) Exposed Structure Necrotic Amount: Large (67-100%) Fascia Exposed: No Necrotic Quality: Adherent Slough Fat Layer (Subcutaneous Tissue) Exposed: No Tine,  Marcella J. ([id number]) Tendon Exposed: No Muscle Exposed: No Joint Exposed: No Bone Exposed: No Limited to Skin Breakdown Periwound Skin Texture Texture Color No Abnormalities Noted: No No Abnormalities Noted: No Moisture Temperature / Pain No Abnormalities Noted: No Temperature: No Abnormality Tenderness on Palpation: Yes Wound Preparation Ulcer Cleansing: Rinsed/Irrigated with Saline Topical Anesthetic Applied: Other: lidocaine 4%, Treatment Notes Wound #3 (Left, Medial Lower Leg) 1. Cleansed with: Clean wound with Normal Saline Cleanse wound with antibacterial soap and water 2. Anesthetic Topical Lidocaine 4% cream to wound bed prior to debridement 4. Dressing Applied: Santyl Ointment 5. Secondary Dressing Applied ABD Pad Dry Gauze 7. Secured with Tape Notes kerlix. Claudie Leachcoban, tca, drawtex Electronic Signature(s) Signed: 02/16/2017 3:56:24 PM By: Alejandro MullingPinkerton, Debra Entered By: Alejandro MullingPinkerton, Debra on 02/16/2017 14:11:52 [name], [name] ([id number]) -------------------------------------------------------------------------------- Wound Assessment  Details Patient Name: Jordan BandaFULLER, Jordan J. Date of Service: [facility] 1:30 PM Medical Record Patient Account Number: [id number] Number: Treating RN: Phillis Haggisinkerton, Debi 26-Oct-1931 (83 y.o. Other Clinician: Date of Birth/Sex: Female) Treating ROBSON, MICHAEL Primary Care Riyansh Gerstner: [name], NARGES Kerwin Augustus/Extender: G Referring Raeley Gilmore: [name], NARGES Weeks in Treatment: 1 Wound Status Wound Number: 4 Primary Venous Leg Ulcer Etiology: Wound Location: Right Lower Leg - Medial Wound Status: Open Wounding Event: Gradually Appeared Comorbid Cataracts, Arrhythmia, Hypertension, Date Acquired: Hopkins History: Osteoarthritis Weeks Of Treatment: 0 Clustered Wound: No Photos Photo Uploaded By: Alejandro MullingPinkerton, Debra on 02/16/2017 15:45:13 Wound Measurements Length: (cm) 0.5 Width: (cm) 0.4 Depth: (cm) 0.1 Area: (cm) 0.157 Volume: (cm) 0.016 % Reduction in Area: % Reduction in Volume: Epithelialization: None Tunneling: No Undermining: No Wound Description Classification: Partial Thickness Foul Odor Afte Wound Margin: Distinct, outline attached Slough/Fibrino Exudate Amount: Large Exudate Type: Serous Exudate Color: amber r Cleansing: No Yes Wound Bed Granulation Amount: None Present (0%) Exposed Structure Necrotic Amount: Large (67-100%) Fascia Exposed: No Necrotic Quality: Adherent Slough Fat Layer (Subcutaneous Tissue) Exposed: No Sosa, Asenath J. ([id number]) Tendon Exposed: No Muscle Exposed: No Joint Exposed: No Bone Exposed: No Limited to Skin Breakdown Periwound Skin Texture Texture Color No Abnormalities Noted: No No Abnormalities Noted: No Moisture Temperature / Pain No Abnormalities Noted: No Temperature: No Abnormality Tenderness on Palpation: Yes Wound Preparation Ulcer Cleansing: Rinsed/Irrigated with Saline Topical Anesthetic Applied: Other: lidocaine 4%, Treatment Notes Wound #4 (Right, Medial Lower Leg) 1. Cleansed with: Clean wound with  Normal Saline Cleanse wound with antibacterial soap and water 2. Anesthetic Topical Lidocaine 4% cream to wound bed prior to debridement 4. Dressing Applied: Aquacel Ag 5. Secondary Dressing Applied ABD Pad Dry Gauze 7. Secured with Tape Notes kerlix. Claudie Leachcoban, tca, drawtex Electronic Signature(s) Signed: 02/16/2017 3:56:24 PM By: Alejandro MullingPinkerton, Debra Entered By: Alejandro MullingPinkerton, Debra on 02/16/2017 14:10:27 Yamashiro, [name] ([id number]) -------------------------------------------------------------------------------- Vitals Details Patient Name: Jordan BandaFULLER, Iliana J. Date of Service: Hopkins 1:30 PM Medical Record Patient Account Number: [id number] Number: Treating RN: Phillis Haggisinkerton, Debi 26-Oct-1931 (83 y.o. Other Clinician: Date of Birth/Sex: Female) Treating ROBSON, MICHAEL Primary Care Jennife Zaucha: [name], NARGES Jigar Zielke/Extender: G Referring Arleny Kruger: [name], NARGES Weeks in Treatment: 1 Vital Signs Time Taken: 14:03 Temperature (F): 97.5 Height (in): 66 Pulse (bpm): 74 Weight (lbs): 159 Respiratory Rate (breaths/min): 16 Body Mass Index (BMI): 25.7 Blood Pressure (mmHg): 152/80 Reference Range: 80 - 120 mg / dl Electronic Signature(s) Signed: 02/16/2017 3:56:24 PM By: Alejandro MullingPinkerton, Debra Entered By: Alejandro MullingPinkerton, Debra on 02/16/2017 14:16:57

## 2017-02-18 ENCOUNTER — Other Ambulatory Visit: Payer: Self-pay | Admitting: Internal Medicine

## 2017-02-18 ENCOUNTER — Other Ambulatory Visit: Payer: Self-pay | Admitting: Cardiovascular Disease

## 2017-02-18 DIAGNOSIS — I739 Peripheral vascular disease, unspecified: Secondary | ICD-10-CM

## 2017-02-19 ENCOUNTER — Ambulatory Visit: Payer: Medicare Other

## 2017-02-19 ENCOUNTER — Ambulatory Visit (INDEPENDENT_AMBULATORY_CARE_PROVIDER_SITE_OTHER): Payer: Medicare Other | Admitting: Cardiovascular Disease

## 2017-02-19 ENCOUNTER — Other Ambulatory Visit
Admission: RE | Admit: 2017-02-19 | Discharge: 2017-02-19 | Disposition: A | Discharge status: Home / Self Care | Payer: Medicare Other | Source: Ambulatory Visit | Attending: Cardiovascular Disease | Admitting: Cardiovascular Disease

## 2017-02-19 ENCOUNTER — Encounter: Payer: Self-pay | Admitting: Cardiovascular Disease

## 2017-02-19 VITALS — BP 144/68 | HR 66 | Ht 65.0 in | Wt 143.5 lb

## 2017-02-19 DIAGNOSIS — Z01818 Encounter for other preprocedural examination: Secondary | ICD-10-CM

## 2017-02-19 DIAGNOSIS — I739 Peripheral vascular disease, unspecified: Secondary | ICD-10-CM

## 2017-02-19 DIAGNOSIS — I10 Essential (primary) hypertension: Secondary | ICD-10-CM

## 2017-02-19 LAB — CBC
HEMATOCRIT: 35.5 % (ref 35.0–47.0)
HEMOGLOBIN: 12.5 g/dL (ref 12.0–16.0)
MCH: 32.8 pg (ref 26.0–34.0)
MCHC: 35.3 g/dL (ref 32.0–36.0)
MCV: 93 fL (ref 80.0–100.0)
Platelets: 175 10*3/uL (ref 150–440)
RBC: 3.82 MIL/uL (ref 3.80–5.20)
RDW: 14 % (ref 11.5–14.5)
WBC: 10.8 10*3/uL (ref 3.6–11.0)

## 2017-02-19 LAB — BASIC METABOLIC PANEL
ANION GAP: 11 (ref 5–15)
BUN: 20 mg/dL (ref 6–20)
CO2: 24 mmol/L (ref 22–32)
Calcium: 9.2 mg/dL (ref 8.9–10.3)
Chloride: 102 mmol/L (ref 101–111)
Creatinine, Ser: 1.03 mg/dL — ABNORMAL HIGH (ref 0.44–1.00)
GFR calc Af Amer: 57 mL/min — ABNORMAL LOW (ref >60)
GFR, EST NON AFRICAN AMERICAN: 49 mL/min — AB (ref >60)
GLUCOSE: 78 mg/dL (ref 65–99)
POTASSIUM: 3.6 mmol/L (ref 3.5–5.1)
SODIUM: 137 mmol/L (ref 135–145)

## 2017-02-19 LAB — PROTIME-INR
INR: 1.04
Prothrombin Time: 13.6 seconds (ref 11.4–15.2)

## 2017-02-19 NOTE — Progress Notes (Signed)
  Cardiology Office Note   Date:  02/20/2017   ID:  [name], DOB 08/09/1931, MRN [id number]  PCP:  FARAHI, NARGES, MD  Cardiologist:   Aaria Happ, MD   Chief Complaint  Patient presents with  . other    wound care/LE ART fu. Pt states she is doing well. Reviewed meds with pt verbally.      History of Present Illness: [name] is a 83 y.o. female who was referred by Dr. Robson for evaluation of peripheral arterial disease. The patient has no history of diabetes or tobacco use. She is known to have hypertension. She was referred last year for evaluation of ulceration on the right medial ankle. Her vascular studies were abnormal on the left side but not the right and the ulcer was felt to be venous with cellulits. Thus, no angiography was performed. The patient was treated conservatively and the ulcer ultimately healed. However, the patient developed recurrent ulceration on both sides including the left leg and thus she was referred again for evaluation. The patient does not remember any injury and she denies significant pain or claudication although she is not very active. She currently has 2 wounds on the right lateral malleolus and left medial calf   Past Medical History:  Diagnosis Date  . Dizziness   . Hypertension     Past Surgical History:  Procedure Laterality Date  . HERNIA REPAIR    . JOINT REPLACEMENT     left knee     Current Outpatient Prescriptions  Medication Sig Dispense Refill  . amLODipine (NORVASC) 10 MG tablet Take 10 mg by mouth daily.   5  . aspirin EC 81 MG tablet Take 81 mg by mouth daily.    . BIOTIN PO Take 1 tablet by mouth daily.    . cholecalciferol (VITAMIN D) 1000 units tablet Take 1,000 Units by mouth daily.    . labetalol (NORMODYNE) 100 MG tablet Take 100 mg by mouth 2 (two) times daily.   5  . potassium chloride (K-DUR) 10 MEQ tablet Take 10 mEq by mouth daily.   1  . triamterene-hydrochlorothiazide (MAXZIDE-25) 37.5-25  MG per tablet Take 1 tablet by mouth daily.  5   No current facility-administered medications for this visit.     Allergies:   Sulfa antibiotics    Social History:  The patient  reports that she has never smoked. She has never used smokeless tobacco. She reports that she does not drink alcohol or use drugs.   Family History:  The patient's Family history is negative for coronary artery disease.   ROS:  Please see the history of present illness.   Otherwise, review of systems are positive for none.   All other systems are reviewed and negative.    PHYSICAL EXAM: VS:  BP (!) 144/68 (BP Location: Right Arm, Patient Position: Sitting, Cuff Size: Normal)   Pulse 66   Ht 5' 5" (1.651 m)   Wt 143 lb 8 oz (65.1 kg)   BMI 23.88 kg/m  , BMI Body mass index is 23.88 kg/m. GEN: Well nourished, well developed, in no acute distress  HEENT: normal  Neck: no JVD, carotid bruits, or masses Cardiac: RRR; no murmurs, rubs, or gallops,no edema  Respiratory:  clear to auscultation bilaterally, normal work of breathing GI: soft, nontender, nondistended, + BS MS: no deformity or atrophy  Skin: warm and dry, no rash Neuro:  Strength and sensation are intact Psych: euthymic mood, full affect Both   legs were wrapped from the ankle all the way up to the knees and thus we did not unwrap them.  Based on the description from the wound clinic, there is a 3 x 2 cm wound on the right lateral malleolus. There is a 2.5 x 1 cm wound on the left medial lower leg. There is also a 0.5 x 0.4 cm wound at the right medial lower leg.  EKG:  EKG is ordered today. EKG showed normal sinus rhythm with first-degree AV block.   Recent Labs: 06/28/2016: ALT 17 06/30/2016: Magnesium 1.8 02/19/2017: BUN 20; Creatinine, Ser 1.03; Hemoglobin 12.5; Platelets 175; Potassium 3.6; Sodium 137    Lipid Panel No results found for: CHOL, TRIG, HDL, CHOLHDL, VLDL, LDLCALC, LDLDIRECT    Wt Readings from Last 3 Encounters:  02/19/17  143 lb 8 oz (65.1 kg)  07/21/16 165 lb 8 oz (75.1 kg)  06/28/16 159 lb 9.6 oz (72.4 kg)         ASSESSMENT AND PLAN:  1. Peripheral arterial disease: The patient has wounds on both lower extremities that are highly suggestive of venous wounds. Nonetheless, peripheral arterial disease might be contributing to the recurrent ulceration as well as slow healing especially on the left side. We repeated vascular studies today which showed an ABI of 0.95 on the right and 0.74 on the left with monophasic waveforms. Duplex showed borderline stenosis in the right external iliac artery, moderate stenosis in the right TP trunk and moderate stenosis in the left common femoral artery, ostial SFA and TP trunk. Due to all of this, I think it's best to proceed with angiography to evaluate her circulation and the need for further revascularization to assist with wound healing. In the meanwhile, continue wound care. I discussed the procedure in details as well as risks and benefits.  2. Essential hypertension: Blood pressure is mildly elevated today.   Disposition:   FU with me in 1 month.  Signed,  Ahonesty Woodfin, MD  02/20/2017 4:09 PM    [facility] 

## 2017-02-19 NOTE — Patient Instructions (Addendum)
Medication Instructions:  Your physician recommends that you continue on your current medications as directed. Please refer to the Current Medication list given to you today.   Labwork: BMET, CBC, PT/INR today at [facility], [address] [place]  Testing/Procedures: Abdominal aortogram with lower extremity [facility]  [address] Entrance [facility], Short Stay  Wednesday, March 14 Arrival time 8:30am Nothing to eat or drink after midnight the evening before your procedure. You may take your morning medications with a sip of water.    Follow-Up: Your physician recommends that you schedule a follow-up appointment in: one month with Dr. Kirke CorinArida.    Any Other Special Instructions Will Be Listed Below (If Applicable).     If you need a refill on your cardiac medications before your next appointment, please call your pharmacy.

## 2017-02-19 NOTE — Progress Notes (Signed)
[name], Jordan J. ([id number]) Visit Report for 02/16/2017 Chief Complaint Document Details [name], Jordan J. Date of Service: 02/16/2017 1:30 PM Medical Record Patient Account Number: [id number] Number: Treating RN: Phillis Haggisinkerton, Debi [birth year] (83 y.o. Other Clinician: Date of Birth/Sex: Female) Treating [name] Primary Care Provider: [name], Hopkins Provider/Extender: Hopkins Referring Provider: [name], Hopkins Weeks in Treatment: 1 Information Obtained from: Patient Chief Complaint 06/10/16; the patient is here for review of a wound on the right anterior medial lower leg above the ankle Electronic Signature(s) Signed: 02/17/2017 8:02:19 AM By: Jordan Najjarobson, Michael MD Entered By: [name] on 02/17/2017 07:49:12 [name], [name] ([id number]) -------------------------------------------------------------------------------- Debridement Details [name], Jordan J. Date of Service: 02/16/2017 1:30 PM Medical Record Patient Account Number: [id number] Number: Treating RN: Phillis Haggisinkerton, Debi [birth year] (83 y.o. Other Clinician: Date of Birth/Sex: Female) Treating [name] Primary Care Provider: [name], Hopkins Provider/Extender: Hopkins Referring Provider: Gabriel RungFARAHI, Hopkins Weeks in Treatment: 1 Debridement Performed for Wound #2 Right,Lateral Malleolus Assessment: Performed By: Physician Jordan CaulOBSON, Hopkins G, MD Debridement: Debridement Pre-procedure Yes - 14:28 Verification/Time Out Taken: Start Time: 14:29 Pain Control: Lidocaine 4% Topical Solution Level: Skin/Subcutaneous Tissue Total Area Debrided (L x 3.2 (cm) x 1.7 (cm) = 5.44 (cm) W): Tissue and other Viable, Non-Viable, Exudate, Fibrin/Slough, Subcutaneous material debrided: Instrument: Curette Bleeding: Minimum Hemostasis Achieved: Pressure End Time: 14:31 Procedural Pain: 0 Post Procedural Pain: 0 Response to Treatment: Procedure was tolerated well Post Debridement Measurements of  Total Wound Length: (cm) 3.2 Width: (cm) 1.7 Depth: (cm) 0.2 Volume: (cm) 0.855 Character of Wound/Ulcer Post Requires Further Debridement Debridement: Severity of Tissue Post Debridement: Fat layer exposed Post Procedure Diagnosis Same as Pre-procedure Electronic Signature(s) Signed: 02/17/2017 8:02:19 AM By: Jordan Najjarobson, Michael MD Signed: 02/17/2017 4:45:11 PM By: [name] Langner, Marchia J. ([id number]) Previous Signature: 02/16/2017 3:56:24 PM Version By: [name] Entered By: [name] on 02/17/2017 07:48:51 [name], [name] ([id number]) -------------------------------------------------------------------------------- Debridement Details [name], Jordan J. Date of Service: 02/16/2017 1:30 PM Medical Record Patient Account Number: [id number] Number: Treating RN: Phillis Haggisinkerton, Debi [birth year] (83 y.o. Other Clinician: Date of Birth/Sex: Female) Treating [name] Primary Care Provider: [name], Hopkins Provider/Extender: Hopkins Referring Provider: Gabriel RungFARAHI, Hopkins Weeks in Treatment: 1 Debridement Performed for Wound #3 Left,Medial Lower Leg Assessment: Performed By: Physician Jordan CaulOBSON, Hopkins G, MD Debridement: Debridement Pre-procedure Yes - 14:28 Verification/Time Out Taken: Start Time: 14:31 Pain Control: Lidocaine 4% Topical Solution Level: Skin/Subcutaneous Tissue Total Area Debrided (L x 2.5 (cm) x 0.9 (cm) = 2.25 (cm) W): Tissue and other Viable, Non-Viable, Exudate, Fibrin/Slough, Subcutaneous material debrided: Instrument: Curette Bleeding: Minimum Hemostasis Achieved: Pressure End Time: 14:32 Procedural Pain: 0 Post Procedural Pain: 0 Response to Treatment: Procedure was tolerated well Post Debridement Measurements of Total Wound Length: (cm) 2.5 Width: (cm) 0.9 Depth: (cm) 0.1 Volume: (cm) 0.177 Character of Wound/Ulcer Post Requires Further Debridement Debridement: Severity of Tissue Post Debridement: Fat layer  exposed Post Procedure Diagnosis Same as Pre-procedure Electronic Signature(s) Signed: 02/17/2017 8:02:19 AM By: Jordan Najjarobson, Michael MD Signed: 02/17/2017 4:45:11 PM By: [name] Mcgreal, Symia J. ([id number]) Previous Signature: 02/16/2017 3:56:24 PM Version By: [name] Entered By: [name] on 02/17/2017 07:49:04 [name], [name] ([id number]) -------------------------------------------------------------------------------- HPI Details [name], Jordan J. Date of Service: 02/16/2017 1:30 PM Medical Record Patient Account Number: [id number] Number: Treating RN: Phillis Haggisinkerton, Debi [birth year] (83 y.o. Other Clinician: Date of Birth/Sex: Female) Treating [name] Primary Care Provider: [name], Hopkins Provider/Extender: Hopkins Referring Provider: [name], Hopkins Weeks in Treatment:  1 History of Present Illness HPI Description: 06/10/16; this is an 83 year old woman who lives near [place] with family members. She saw her primary doctor roughly over a week ago and was referred to the ER for a cellulitis in the right medial leg. She was given a round of IV antibiotics in the emergency room and discharged on oral Keflex which she is taking. She is been left with a uncomfortable leg area on the right medial lower leg. Her ABIs in this clinic were 0.8 on the right 0.6 on the left. She does not describe claudication. She is not a diabetic. Lab work in the ER showed a normal BUN and creatinine on 6/20 at 18 and 0.92 respectively white count was 10.1 hemoglobin at 13.8. She did not have any imaging studies. I can see no vascular evaluations in Epic. She does not have a prior wound history 06/17/16; the area affected on her right medial ankle is an area I thought was due to tissue damage from cellulitis last week. She is going for venous reflux studies this afternoon, these were not ordered in this clinic at least not by me. The patient does not give a prior history of  damage to the skin in this area although I wonder if she actually might not of noticed it. We have been using's Aquacel Ag under a wrap although we will not be able to wrap her today 06/24/16: pt reports today she hasn't heard from the vascular clinic regarding proceeding with arterial studies. our office will assist with this today. she denies s/s of infection. denies problems with her wraps. 07/07/16; since I last saw this woman she was admitted to hospital from 7/16 through 7/18 with sepsis syndrome felt to be secondary to her ulcer. She was treated with Vanco and Zosyn the hospital discharged on Septra. Blood cultures were negative. Urine culture showed multiple species she is now feeling well using Santyl with border foam to her wound. [facility] 07/14/16; no major improvement here. She has a quarter-sized wound covered with thick adherent slough on the lateral aspect of her right leg with several surrounding satellite lesions in a similar state. She has had 2 recent bouts of cellulitis, one before she came to this clinic and one required a recent hospitalization. I have asked for arterial studies I don't believe these have ever been done. My notes suggest from early in July that she went for reflux studies although I don't know that I have ever seen these either, she did have a DVT rule out but I did not order this, this did not show a DVT but did show a Baker's cyst 07/21/2016 -- the patient had an arterial duplex study done with waveform suggestive of right femoropopliteal disease. There is also small vessel disease bilaterally. Right ABI was in the normal range of 1.0 and a TBI was 0.74. Left ABI is abnormal at 0.69 with a TBI of 0.40. Three-vessel runoff on the right and a two-vessel runoff on the left. She has an appointment to see Dr. Kirke Corin later today. 07/28/2016 -- was seen by Dr. Kirke Corin who reviewed her arterial studies with an ABI of 0.94 on the right and 0.69 on the  left with normal right toe brachial index. Duplex showed diffuse atherosclerosis without discrete Oates, Delrae J. ([id number]) stenosis and there was a three-vessel runoff on the right below the knee. Based on these studies and the location of the ulcer he thought it was venous commended she  continue with wound care consults. 08/04/16; I note the patient's reasonably functional arterial supply which is fortunate. The wound bed appears to be smaller. Debrided of surface slough. She is been using Iodoflex 08/11/16: Only a very small open area remains. However it has some depth 08/18/16; small open area is smaller. Still open however. 08/25/16; the area has totally closed down and epithelialized. READMISSION 02/09/17; this is an 83 year old woman who is not a diabetic or smoker. She was in our clinic in the summer into mid-September with a wound on her right medial calf that. This eventually closed down. I thought this was mostly venous. She did have evidence of PAD and she was seen by Dr. Kirke Corin. At that point she had an ABI of 0.94 on the right 0.69 on the left with a normal right toe brachial index. It was felt that she had enough blood flow to close her wound and that happened. She was discharged with stockings although I don't get the sense that she really use them she tells me that roughly a week or 2 she developed swelling in her bilateral lower legs and then noted wounds on her right lateral malleolus and left medial calf. The on this she is not really sure how this happened. She is not having a lot of pain although she is not ambulatory that much only in her home. Other than weight loss she has not noticed any other issues no specific claudication no chest pain. 02/16/17; the patient continues with 2 wounds on the right lateral malleolus and left medial calf. Both of these are painful. She has chronic venous insufficiency but also known PAD and is previously seen Dr. Kirke Corin. We have arranged  vascular follow-up with Dr. Kirke Corin to look at the arterial situation Electronic Signature(s) Signed: 02/17/2017 8:02:19 AM By: Jordan Najjar MD Entered By: Jordan Najjar on 02/17/2017 07:50:26 [name], [name] ([id number]) -------------------------------------------------------------------------------- Physical Exam Details [name], [name]. Date of Service: 02/16/2017 1:30 PM Medical Record Patient Account Number: [id number] Number: Treating RN: [name] Dec 21, 1930 (83 y.o. Other Clinician: Date of Birth/Sex: Female) Treating [name] Primary Care Provider: Guy Begin Provider/Extender: Hopkins Referring Provider: Guy Begin Weeks in Treatment: 1 Constitutional Patient is hypertensive.. Pulse regular and within target range for patient.Marland Kitchen Respirations regular, non-labored and within target range.. Temperature is normal and within the target range for the patient.. Patient's appearance is neat and clean. Appears in no acute distress. Well nourished and well developed.. Notes Wound exam; patient has a fairly substantive wound on the right lateral ankle. Again covered in adherent necrotic slough. The left medial leg wound is essentially in the same condition but not as large as the right. There is no evidence of surrounding infection. Using a #3 curet, both wounds were debrided of necrotic material. Not a lot of bleeding noted hemostasis with direct pressure. Electronic Signature(s) Signed: 02/17/2017 8:02:19 AM By: Jordan Najjar MD Entered By: Jordan Najjar on 02/17/2017 07:59:09 [name], [name] ([id number]) -------------------------------------------------------------------------------- Physician Orders Details [name], [name]. Date of Service: 02/16/2017 1:30 PM Medical Record Patient Account Number: [id number] Number: Treating RN: [name] 10-27-1931 (83 y.o. Other Clinician: Date of Birth/Sex: Female)  Treating [name] Primary Care Provider: Guy Begin Provider/Extender: Hopkins Referring Provider: [name] in Treatment: 1 Verbal / Phone Orders: Yes Clinician: Pinkerton, Debi Read Back and Verified: Yes Diagnosis Coding Wound Cleansing Wound #2 Right,Lateral Malleolus o Clean wound with Normal Saline. o Cleanse wound with mild soap and  water Wound #3 Left,Medial Lower Leg o Clean wound with Normal Saline. o Cleanse wound with mild soap and water Wound #4 Right,Medial Lower Leg o Clean wound with Normal Saline. o Cleanse wound with mild soap and water Anesthetic Wound #2 Right,Lateral Malleolus o Topical Lidocaine 4% cream applied to wound bed prior to debridement - clinic use only Wound #3 Left,Medial Lower Leg o Topical Lidocaine 4% cream applied to wound bed prior to debridement - clinic use only Wound #4 Right,Medial Lower Leg o Topical Lidocaine 4% cream applied to wound bed prior to debridement - clinic use only Skin Barriers/Peri-Wound Care Wound #2 Right,Lateral Malleolus o Triamcinolone Acetonide Ointment Wound #3 Left,Medial Lower Leg o Triamcinolone Acetonide Ointment Wound #4 Right,Medial Lower Leg o Triamcinolone Acetonide Ointment Primary Wound Dressing Wound #2 Right,Lateral Malleolus Khim, Kelina J. ([id number]) o Santyl Ointment Wound #3 Left,Medial Lower Leg o Santyl Ointment Wound #4 Right,Medial Lower Leg o Aquacel Ag Secondary Dressing Wound #2 Right,Lateral Malleolus o ABD pad o Dry Gauze o Drawtex Wound #3 Left,Medial Lower Leg o ABD pad o Dry Gauze o Drawtex Wound #4 Right,Medial Lower Leg o ABD pad o Dry Gauze o Drawtex Dressing Change Frequency Wound #2 Right,Lateral Malleolus o Change dressing every week Wound #3 Left,Medial Lower Leg o Change dressing every week Wound #4 Right,Medial Lower Leg o Change dressing every week Follow-up Appointments Wound #2  Right,Lateral Malleolus o Return Appointment in 1 week. Wound #3 Left,Medial Lower Leg o Return Appointment in 1 week. Wound #4 Right,Medial Lower Leg o Return Appointment in 1 week. Edema Control Wound #2 Right,Lateral Malleolus o Kerlix and Coban - Bilateral - unna to anchor Birchler, Celina J. ([id number]) Wound #3 Left,Medial Lower Leg o Kerlix and Coban - Bilateral - unna to anchor Wound #4 Right,Medial Lower Leg o Kerlix and Coban - Bilateral - unna to anchor Additional Orders / Instructions Wound #2 Right,Lateral Malleolus o Increase protein intake. Wound #3 Left,Medial Lower Leg o Increase protein intake. Wound #4 Right,Medial Lower Leg o Increase protein intake. Consults o Vascular - Dr. Kirke Corin Electronic Signature(s) Signed: 02/17/2017 4:45:11 PM By: Jordan Mulling Signed: 02/18/2017 5:01:50 PM By: Jordan Najjar MD Previous Signature: 02/16/2017 3:56:24 PM Version By: Jordan Mulling Previous Signature: 02/17/2017 8:02:19 AM Version By: Jordan Najjar MD Entered By: Jordan Mulling on 02/17/2017 08:09:00 ALYSSHA, HOUSH ([id number]) -------------------------------------------------------------------------------- Problem List Details [name], [name]. Date of Service: 02/16/2017 1:30 PM Medical Record Patient Account Number: [id number] Number: Treating RN: [name] 1931/12/07 (83 y.o. 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 Date of Service: 02/16/2017 1:30 PM Medical Record Patient Account Number: [id number] Number: Treating RN: [name] 10-21-1931 (83 y.o. Other Clinician: Date of Birth/Sex: Female) Treating [name] Primary Care Provider: Guy Begin Provider/Extender: Hopkins Referring Provider: Guy Begin Weeks in Treatment: 1 Subjective Chief Complaint Information obtained from Patient 06/10/16; the patient is here for review  of a wound on the right anterior medial lower leg above the ankle History of Present Illness (HPI) 06/10/16; this is an 83 year old woman who lives near [place] with family members. She saw her primary doctor roughly over a week ago and was referred to the ER for a cellulitis in the right medial leg. She was given a round of IV antibiotics in the emergency room and discharged on oral Keflex which she is taking. She is been left with a uncomfortable leg area on the right medial lower leg. Her ABIs in this clinic were 0.8 on the right 0.6 on the left. She does not describe claudication. She is not a diabetic. Lab work in the ER showed a normal BUN and creatinine on 6/20 at 18 and 0.92 respectively white count was 10.1 hemoglobin at 13.8. She did not have any imaging studies. I can see no vascular evaluations in Epic. She does not have a prior wound history 06/17/16; the area affected on her right medial ankle is an area I thought was due to tissue damage from cellulitis last week. She is going for venous reflux studies this afternoon, these were not ordered in this clinic at least not by me. The patient does not give  a prior history of damage to the skin in this area although I wonder if she actually might not of noticed it. We have been using's Aquacel Ag under a wrap although we will not be able to wrap her today 06/24/16: pt reports today she hasn't heard from the vascular clinic regarding proceeding with arterial studies. our office will assist with this today. she denies s/s of infection. denies problems with her wraps. 07/07/16; since I last saw this woman she was admitted to hospital from 7/16 through 7/18 with sepsis syndrome felt to be secondary to her ulcer. She was treated with Vanco and Zosyn the hospital discharged on Septra. Blood cultures were negative. Urine culture showed multiple species she is now feeling well using Santyl with border foam to her wound. [facility] 07/14/16; no major improvement here. She has a quarter-sized wound covered with thick adherent slough on the lateral aspect of her right leg with several surrounding satellite lesions in a similar state. She has had 2 recent bouts of cellulitis, one before she came to this clinic and one required a recent hospitalization. I have asked for arterial studies I don't believe these have ever been done. My notes suggest from early in July that she went for reflux studies although I don't know that I have ever seen these either, she did have a DVT rule out but I did not order this, this did not show a DVT but did show a Baker's cyst 07/21/2016 -- the patient had an arterial duplex study done with waveform suggestive of right femoropopliteal disease. There is also small vessel disease bilaterally. Right ABI was in the normal range of 1.0 and a TBI Eskelson, Denette J. ([id number]) was 0.74. Left ABI is abnormal at 0.69 with a TBI of 0.40. Three-vessel runoff on the right and a two-vessel runoff on the left. She has an appointment to see Dr. Kirke Corin later today. 07/28/2016 -- was seen by Dr. Kirke Corin who reviewed her arterial studies  with an ABI of 0.94 on the right and 0.69 on the left with normal right toe brachial index. Duplex showed diffuse atherosclerosis without discrete stenosis and there was a three-vessel runoff on the right below the knee. Based on these studies and the  location of the ulcer he thought it was venous commended she continue with wound care consults. 08/04/16; I note the patient's reasonably functional arterial supply which is fortunate. The wound bed appears to be smaller. Debrided of surface slough. She is been using Iodoflex 08/11/16: Only a very small open area remains. However it has some depth 08/18/16; small open area is smaller. Still open however. 08/25/16; the area has totally closed down and epithelialized. READMISSION 02/09/17; this is an 83 year old woman who is not a diabetic or smoker. She was in our clinic in the summer into mid-September with a wound on her right medial calf that. This eventually closed down. I thought this was mostly venous. She did have evidence of PAD and she was seen by Dr. Kirke Corin. At that point she had an ABI of 0.94 on the right 0.69 on the left with a normal right toe brachial index. It was felt that she had enough blood flow to close her wound and that happened. She was discharged with stockings although I don't get the sense that she really use them she tells me that roughly a week or 2 she developed swelling in her bilateral lower legs and then noted wounds on her right lateral malleolus and left medial calf. The on this she is not really sure how this happened. She is not having a lot of pain although she is not ambulatory that much only in her home. Other than weight loss she has not noticed any other issues no specific claudication no chest pain. 02/16/17; the patient continues with 2 wounds on the right lateral malleolus and left medial calf. Both of these are painful. She has chronic venous insufficiency but also known PAD and is previously seen Dr. Kirke Corin.  We have arranged vascular follow-up with Dr. Kirke Corin to look at the arterial situation Objective Constitutional Patient is hypertensive.. Pulse regular and within target range for patient.Marland Kitchen Respirations regular, non-labored and within target range.. Temperature is normal and within the target range for the patient.. Patient's appearance is neat and clean. Appears in no acute distress. Well nourished and well developed.. Vitals Time Taken: 2:03 PM, Height: 66 in, Weight: 159 lbs, BMI: 25.7, Temperature: 97.5 F, Pulse: 74 bpm, Respiratory Rate: 16 breaths/min, Blood Pressure: 152/80 mmHg. General Notes: Wound exam; patient has a fairly substantive wound on the right lateral ankle. Again covered in adherent necrotic slough. The left medial leg wound is essentially in the same condition but not as large as the right. There is no evidence of surrounding infection. Using a #3 curet, both wounds were Basu, Karinna J. ([id number]) debrided of necrotic material. Not a lot of bleeding noted hemostasis with direct pressure. Integumentary (Hair, Skin) Wound #2 status is Open. Original cause of wound was Gradually Appeared. The wound is located on the Right,Lateral Malleolus. The wound measures 3.2cm length x 1.7cm width x 0.1cm depth; 4.273cm^2 area and 0.427cm^3 volume. The wound is limited to skin breakdown. There is no tunneling or undermining noted. There is a large amount of serosanguineous drainage noted. The wound margin is distinct with the outline attached to the wound base. There is no granulation within the wound bed. There is a large (67- 100%) amount of necrotic tissue within the wound bed including Eschar and Adherent Slough. Periwound temperature was noted as No Abnormality. The periwound has tenderness on palpation. Wound #3 status is Open. Original cause of wound was Gradually Appeared. The wound is located on the Left,Medial Lower Leg. The wound measures 2.5cm length  x 0.9cm width x 0.1cm  depth; 1.767cm^2 area and 0.177cm^3 volume. The wound is limited to skin breakdown. There is no tunneling or undermining noted. There is a large amount of serous drainage noted. The wound margin is distinct with the outline attached to the wound base. There is no granulation within the wound bed. There is a large (67-100%) amount of necrotic tissue within the wound bed including Adherent Slough. Periwound temperature was noted as No Abnormality. The periwound has tenderness on palpation. Wound #4 status is Open. Original cause of wound was Gradually Appeared. The wound is located on the Right,Medial Lower Leg. The wound measures 0.5cm length x 0.4cm width x 0.1cm depth; 0.157cm^2 area and 0.016cm^3 volume. The wound is limited to skin breakdown. There is no tunneling or undermining noted. There is a large amount of serous drainage noted. The wound margin is distinct with the outline attached to the wound base. There is no granulation within the wound bed. There is a large (67-100%) amount of necrotic tissue within the wound bed including Adherent Slough. Periwound temperature was noted as No Abnormality. The periwound has tenderness on palpation. 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 There was a Skin/Subcutaneous Tissue Debridement ([id number]) debridement with total area of 5.44 sq cm performed by Jordan Caul, MD. with the following instrument(s): Curette to remove Viable and Non-Viable tissue/material Gallop, Tishana J. ([id number]) including Exudate, Fibrin/Slough, and Subcutaneous after achieving pain control using Lidocaine 4% Topical  Solution. A time out was conducted at 14:28, prior to the start of the procedure. A Minimum amount of bleeding was controlled with Pressure. The procedure was tolerated well with a pain level of 0 throughout and a pain level of 0 following the procedure. Post Debridement Measurements: 3.2cm length x 1.7cm width x 0.2cm depth; 0.855cm^3 volume. Character of Wound/Ulcer Post Debridement requires further debridement. Severity of Tissue Post Debridement is: Fat layer exposed. Post procedure Diagnosis Wound #2: Same as Pre-Procedure Wound #3 Wound #3 is a Venous Leg Ulcer located on the Left,Medial Lower Leg . There was a Skin/Subcutaneous Tissue Debridement ([id number]) debridement with total area of 2.25 sq cm performed by Jordan Caul, MD. with the following instrument(s): Curette to remove Viable and Non-Viable tissue/material including Exudate, Fibrin/Slough, and Subcutaneous after achieving pain control using Lidocaine 4% Topical Solution. A time out was conducted at 14:28, prior to the start of the procedure. A Minimum amount of bleeding was controlled with Pressure. The procedure was tolerated well with a pain level of 0 throughout and a pain level of 0 following the procedure. Post Debridement Measurements: 2.5cm length x 0.9cm width x 0.1cm depth; 0.177cm^3 volume. Character of Wound/Ulcer Post Debridement requires further debridement. Severity of Tissue Post Debridement is: Fat layer exposed. Post procedure Diagnosis Wound #3: Same as Pre-Procedure Plan Wound Cleansing: Wound #2 Right,Lateral Malleolus: Clean wound with Normal Saline. Cleanse wound with mild soap and water Wound #3 Left,Medial Lower Leg: Clean wound with Normal Saline. Cleanse wound with mild soap and water Wound #4 Right,Medial Lower Leg: Clean wound with Normal Saline. Cleanse wound with mild soap and water Anesthetic: Wound #2 Right,Lateral Malleolus: Topical Lidocaine 4% cream applied to wound bed  prior to  debridement - clinic use only Wound #3 Left,Medial Lower Leg: Topical Lidocaine 4% cream applied to wound bed prior to debridement - clinic use only Wound #4 Right,Medial Lower Leg: Topical Lidocaine 4% cream applied to wound bed prior to debridement - clinic use only Skin Barriers/Peri-Wound Care: Wound #2 Right,Lateral Malleolus: Triamcinolone Acetonide Ointment NAYALI, TALERICO. ([id number]) Wound #3 Left,Medial Lower Leg: Triamcinolone Acetonide Ointment Wound #4 Right,Medial Lower Leg: Triamcinolone Acetonide Ointment Primary Wound Dressing: Wound #2 Right,Lateral Malleolus: Santyl Ointment Wound #3 Left,Medial Lower Leg: Santyl Ointment Wound #4 Right,Medial Lower Leg: Aquacel Ag Secondary Dressing: Wound #2 Right,Lateral Malleolus: ABD pad Dry Gauze Drawtex Wound #3 Left,Medial Lower Leg: ABD pad Dry Gauze Drawtex Wound #4 Right,Medial Lower Leg: ABD pad Dry Gauze Drawtex Dressing Change Frequency: Wound #2 Right,Lateral Malleolus: Change dressing every week Wound #3 Left,Medial Lower Leg: Change dressing every week Wound #4 Right,Medial Lower Leg: Change dressing every week Follow-up Appointments: Wound #2 Right,Lateral Malleolus: Return Appointment in 1 week. Wound #3 Left,Medial Lower Leg: Return Appointment in 1 week. Wound #4 Right,Medial Lower Leg: Return Appointment in 1 week. Edema Control: Wound #2 Right,Lateral Malleolus: Kerlix and Coban - Bilateral - unna to anchor Wound #3 Left,Medial Lower Leg: Kerlix and Coban - Bilateral - unna to anchor Wound #4 Right,Medial Lower Leg: Kerlix and Coban - Bilateral - unna to anchor Additional Orders / Instructions: Wound #2 Right,Lateral Malleolus: Increase protein intake. Wound #3 Left,Medial Lower Leg: Increase protein intake. Wound #4 Right,Medial Lower Leg: Solum, Allix J. ([id number]) Increase protein intake. Consults ordered were: Vascular - Dr. Kirke Corin #1 I have asked for a follow-up  appointment with Dr. Kirke Corin to review the arterial status in both legs. #2 Santyl ointment to the right lateral and left medial wounds #3 Aquacel Ag to the right medial lower leg area which doesn't really have an open area, almost looks like an atrophie blanche type presentation Electronic Signature(s) Signed: 02/17/2017 12:55:17 PM By: Elliot Gurney RN, BSN, Kim RN, BSN Signed: 02/18/2017 5:01:50 PM By: Jordan Najjar MD Previous Signature: 02/17/2017 8:02:19 AM Version By: Jordan Najjar MD Entered By: Elliot Gurney, RN, BSN, Kim on 02/17/2017 12:55:17 Hammen, [name] ([id number]) -------------------------------------------------------------------------------- SuperBill Details Patient Name: RAYSHAWN, VISCONTI. Date of Service: 02/16/2017 Medical Record Patient Account Number: [id number] Number: Treating RN: [name] 1931-10-21 (83 y.o. 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 L97.221 Non-pressure chronic ulcer of left calf limited to breakdown of skin Facility Procedures CPT4 Code Description: 08657846 99214 - WOUND CARE VISIT-LEV 4 EST PT Modifier: Quantity: 1 CPT4 Code Description: [id number] 11042 - DEB SUBQ TISSUE 20 SQ CM/< ICD-10 Description Diagnosis L97.313 Non-pressure chronic ulcer of right ankle with necro L97.221 Non-pressure chronic ulcer of left calf limited to b Modifier: sis of muscle reakdown of sk Quantity: 1 in Physician Procedures CPT4 Code Description: 1324401 11042 - WC PHYS SUBQ TISS 20 SQ CM ICD-10 Description Diagnosis L97.313 Non-pressure chronic ulcer of right ankle  with necro L97.221 Non-pressure chronic ulcer of left calf limited to b Modifier: sis of muscle reakdown of ski Quantity: 1 n Electronic Signature(s) Signed: 02/17/2017 8:02:19 AM By: Jordan Najjar MD Entered By: Jordan Najjar on 02/17/2017 08:01:38

## 2017-02-20 LAB — VAS US LOWER EXTREMITY ARTERIAL DUPLEX
LEFT SFA MID VEL: 0 cm/s
LEFT SFA PROX DYS VEL: 0 cm/s
LPOPDISTDYSV: 0 cm/s
LPOPDPSV: 126 cm/s
LPOPPPSV: 99 cm/s
LPOPPROXDYSV: 0 cm/s
LSFADISTDYSV: 0 cm/s
Left super femoral dist sys PSV: -104 cm/s
Left super femoral mid sys PSV: -120 cm/s
Left super femoral prox sys PSV: 345 cm/s
RIGHT POPLITEAL PROX EDV: 12 cm/s
RIGHT SUPER FEMORAL DIST EDV: -13 cm/s
RIGHT SUPER FEMORAL MID EDV: 0 cm/s
RIGHT SUPER FEMORAL PROX EDV: 9 cm/s
RSFMPSV: -143 cm/s
RSFPPSV: 117 cm/s
RTPOPDISDIA: 0 cm/s
Right popliteal dist sys PSV: 118 cm/s
Right popliteal prox sys PSV: 143 cm/s
Right super femoral dist sys PSV: -119 cm/s

## 2017-02-22 ENCOUNTER — Telehealth: Payer: Self-pay | Admitting: Cardiovascular Disease

## 2017-02-22 NOTE — Telephone Encounter (Signed)
Pt's daughter, Loa SocksJulene, was not in when I returned call. S/w son, [place], ([place] who asks I review angiogram instructions with him. Reviewed procedure, time/location, and pre-procedure instructions. He verbalized understanding with no further questions at this time.

## 2017-02-22 NOTE — Telephone Encounter (Signed)
Patient is confused of time and where to go for her cath. Please call her daughter Loa SocksJulene at [phone]. Thanks

## 2017-02-24 ENCOUNTER — Encounter (HOSPITAL_COMMUNITY)
Admission: RE | Disposition: A | Discharge status: Home / Self Care | Payer: Self-pay | Source: Ambulatory Visit | Attending: Cardiovascular Disease

## 2017-02-24 ENCOUNTER — Ambulatory Visit (HOSPITAL_COMMUNITY)
Admission: RE | Admit: 2017-02-24 | Discharge: 2017-02-24 | Disposition: A | Discharge status: Home / Self Care | Payer: Medicare Other | Source: Ambulatory Visit | Attending: Cardiovascular Disease | Admitting: Cardiovascular Disease

## 2017-02-24 ENCOUNTER — Ambulatory Visit: Payer: Medicare Other | Admitting: Internal Medicine

## 2017-02-24 DIAGNOSIS — I440 Atrioventricular block, first degree: Secondary | ICD-10-CM | POA: Diagnosis not present

## 2017-02-24 DIAGNOSIS — L97829 Non-pressure chronic ulcer of other part of left lower leg with unspecified severity: Secondary | ICD-10-CM | POA: Diagnosis not present

## 2017-02-24 DIAGNOSIS — I739 Peripheral vascular disease, unspecified: Secondary | ICD-10-CM | POA: Diagnosis present

## 2017-02-24 DIAGNOSIS — I70233 Atherosclerosis of native arteries of right leg with ulceration of ankle: Secondary | ICD-10-CM | POA: Insufficient documentation

## 2017-02-24 DIAGNOSIS — L97319 Non-pressure chronic ulcer of right ankle with unspecified severity: Secondary | ICD-10-CM | POA: Diagnosis not present

## 2017-02-24 DIAGNOSIS — I10 Essential (primary) hypertension: Secondary | ICD-10-CM | POA: Diagnosis not present

## 2017-02-24 DIAGNOSIS — I70248 Atherosclerosis of native arteries of left leg with ulceration of other part of lower left leg: Secondary | ICD-10-CM | POA: Diagnosis not present

## 2017-02-24 DIAGNOSIS — Z7982 Long term (current) use of aspirin: Secondary | ICD-10-CM | POA: Insufficient documentation

## 2017-02-24 DIAGNOSIS — I70213 Atherosclerosis of native arteries of extremities with intermittent claudication, bilateral legs: Secondary | ICD-10-CM | POA: Diagnosis not present

## 2017-02-24 HISTORY — PX: ABDOMINAL AORTOGRAM: CATH118222

## 2017-02-24 HISTORY — PX: LOWER EXTREMITY ANGIOGRAPHY: CATH118251

## 2017-02-24 SURGERY — ABDOMINAL AORTOGRAM
Anesthesia: LOCAL

## 2017-02-24 MED ORDER — MIDAZOLAM HCL 2 MG/2ML IJ SOLN
INTRAMUSCULAR | Status: DC | PRN
  Administered 2017-02-24: 1 mg via INTRAVENOUS

## 2017-02-24 MED ORDER — SODIUM CHLORIDE 0.9% FLUSH: 3.0000 mL | INTRAVENOUS | Status: DC | PRN

## 2017-02-24 MED ORDER — HEPARIN (PORCINE) IN NACL 2-0.9 UNIT/ML-% IJ SOLN
INTRAMUSCULAR | Status: AC
  Filled 2017-02-24: qty 1000

## 2017-02-24 MED ORDER — FENTANYL CITRATE (PF) 100 MCG/2ML IJ SOLN
INTRAMUSCULAR | Status: DC | PRN
  Administered 2017-02-24 (×2): 25 ug via INTRAVENOUS

## 2017-02-24 MED ORDER — SODIUM CHLORIDE 0.9% FLUSH: 3.0000 mL | Freq: Two times a day (BID) | INTRAVENOUS | Status: DC

## 2017-02-24 MED ORDER — SODIUM CHLORIDE 0.9 % WEIGHT BASED INFUSION
3.0000 mL/kg/h | INTRAVENOUS | Status: DC
  Administered 2017-02-24: 3 mL/kg/h via INTRAVENOUS

## 2017-02-24 MED ORDER — IODIXANOL 320 MG/ML IV SOLN
INTRAVENOUS | Status: DC | PRN
  Administered 2017-02-24: 140 mL via INTRAVENOUS

## 2017-02-24 MED ORDER — SODIUM CHLORIDE 0.9 % IV SOLN: INTRAVENOUS | Status: AC

## 2017-02-24 MED ORDER — SODIUM CHLORIDE 0.9 % IV SOLN: 250.0000 mL | INTRAVENOUS | Status: DC | PRN

## 2017-02-24 MED ORDER — LIDOCAINE HCL (PF) 1 % IJ SOLN
INTRAMUSCULAR | Status: AC
  Filled 2017-02-24: qty 30

## 2017-02-24 MED ORDER — ASPIRIN 81 MG PO CHEW: 81.0000 mg | CHEWABLE_TABLET | ORAL | Status: DC

## 2017-02-24 MED ORDER — FENTANYL CITRATE (PF) 100 MCG/2ML IJ SOLN
INTRAMUSCULAR | Status: AC
  Filled 2017-02-24: qty 2

## 2017-02-24 MED ORDER — LIDOCAINE HCL (PF) 1 % IJ SOLN
INTRAMUSCULAR | Status: DC | PRN
  Administered 2017-02-24: 12 mL

## 2017-02-24 MED ORDER — HEPARIN (PORCINE) IN NACL 2-0.9 UNIT/ML-% IJ SOLN
INTRAMUSCULAR | Status: DC | PRN
  Administered 2017-02-24: 1000 mL

## 2017-02-24 MED ORDER — SODIUM CHLORIDE 0.9 % WEIGHT BASED INFUSION: 1.0000 mL/kg/h | INTRAVENOUS | Status: DC

## 2017-02-24 MED ORDER — MIDAZOLAM HCL 2 MG/2ML IJ SOLN
INTRAMUSCULAR | Status: AC
  Filled 2017-02-24: qty 2

## 2017-02-24 SURGICAL SUPPLY — 11 items
CATH ANGIO 5F PIGTAIL 65CM (CATHETERS) ×3 IMPLANT
CATH CROSS OVER TEMPO 5F (CATHETERS) ×3 IMPLANT
CATH STRAIGHT 5FR 65CM (CATHETERS) ×3 IMPLANT
KIT PV (KITS) ×3 IMPLANT
SHEATH PINNACLE 5F 10CM (SHEATH) ×3 IMPLANT
STOPCOCK MORSE 400PSI 3WAY (MISCELLANEOUS) ×3 IMPLANT
SYRINGE MEDRAD AVANTA MACH 7 (SYRINGE) ×3 IMPLANT
TRANSDUCER W/STOPCOCK (MISCELLANEOUS) ×3 IMPLANT
TRAY PV CATH (CUSTOM PROCEDURE TRAY) ×3 IMPLANT
TUBING CIL FLEX 10 FLL-RA (TUBING) ×3 IMPLANT
WIRE HITORQ VERSACORE ST 145CM (WIRE) ×3 IMPLANT

## 2017-02-24 NOTE — H&P (View-Only) (Signed)
Cardiology Office Note   Date:  02/20/2017   ID:  Jordan BandaCecil J Vankuren, DOB 1931/02/11, MRN [id number]  PCP:  [name], NARGES, MD  Cardiologist:   Lorine BearsMuhammad Laylaa Guevarra, MD   Chief Complaint  Patient presents with  . other    wound care/LE ART fu. Pt states she is doing well. Reviewed meds with pt verbally.      History of Present Illness: [name] is a 83 y.o. female who was referred by Dr. Leanord Hawkingobson for evaluation of peripheral arterial disease. The patient has no history of diabetes or tobacco use. She is known to have hypertension. She was referred last year for evaluation of ulceration on the right medial ankle. Her vascular studies were abnormal on the left side but not the right and the ulcer was felt to be venous with cellulits. Thus, no angiography was performed. The patient was treated conservatively and the ulcer ultimately healed. However, the patient developed recurrent ulceration on both sides including the left leg and thus she was referred again for evaluation. The patient does not remember any injury and she denies significant pain or claudication although she is not very active. She currently has 2 wounds on the right lateral malleolus and left medial calf   Past Medical History:  Diagnosis Date  . Dizziness   . Hypertension     Past Surgical History:  Procedure Laterality Date  . HERNIA REPAIR    . JOINT REPLACEMENT     left knee     Current Outpatient Prescriptions  Medication Sig Dispense Refill  . amLODipine (NORVASC) 10 MG tablet Take 10 mg by mouth daily.   5  . aspirin EC 81 MG tablet Take 81 mg by mouth daily.    Marland Kitchen. BIOTIN PO Take 1 tablet by mouth daily.    . cholecalciferol (VITAMIN D) 1000 units tablet Take 1,000 Units by mouth daily.    Marland Kitchen. labetalol (NORMODYNE) 100 MG tablet Take 100 mg by mouth 2 (two) times daily.   5  . potassium chloride (K-DUR) 10 MEQ tablet Take 10 mEq by mouth daily.   1  . triamterene-hydrochlorothiazide (MAXZIDE-25) 37.5-25  MG per tablet Take 1 tablet by mouth daily.  5   No current facility-administered medications for this visit.     Allergies:   Sulfa antibiotics    Social History:  The patient  reports that she has never smoked. She has never used smokeless tobacco. She reports that she does not drink alcohol or use drugs.   Family History:  The patient's Family history is negative for coronary artery disease.   ROS:  Please see the history of present illness.   Otherwise, review of systems are positive for none.   All other systems are reviewed and negative.    PHYSICAL EXAM: VS:  BP (!) 144/68 (BP Location: Right Arm, Patient Position: Sitting, Cuff Size: Normal)   Pulse 66   Ht 5\' 5"  (1.651 m)   Wt 143 lb 8 oz (65.1 kg)   BMI 23.88 kg/m  , BMI Body mass index is 23.88 kg/m. GEN: Well nourished, well developed, in no acute distress  HEENT: normal  Neck: no JVD, carotid bruits, or masses Cardiac: RRR; no murmurs, rubs, or gallops,no edema  Respiratory:  clear to auscultation bilaterally, normal work of breathing GI: soft, nontender, nondistended, + BS MS: no deformity or atrophy  Skin: warm and dry, no rash Neuro:  Strength and sensation are intact Psych: euthymic mood, full affect Both  legs were wrapped from the ankle all the way up to the knees and thus we did not unwrap them.  Based on the description from the wound clinic, there is a 3 x 2 cm wound on the right lateral malleolus. There is a 2.5 x 1 cm wound on the left medial lower leg. There is also a 0.5 x 0.4 cm wound at the right medial lower leg.  EKG:  EKG is ordered today. EKG showed normal sinus rhythm with first-degree AV block.   Recent Labs: 06/28/2016: ALT 17 06/30/2016: Magnesium 1.8 02/19/2017: BUN 20; Creatinine, Ser 1.03; Hemoglobin 12.5; Platelets 175; Potassium 3.6; Sodium 137    Lipid Panel No results found for: CHOL, TRIG, HDL, CHOLHDL, VLDL, LDLCALC, LDLDIRECT    Wt Readings from Last 3 Encounters:  02/19/17  143 lb 8 oz (65.1 kg)  07/21/16 165 lb 8 oz (75.1 kg)  06/28/16 159 lb 9.6 oz (72.4 kg)         ASSESSMENT AND PLAN:  1. Peripheral arterial disease: The patient has wounds on both lower extremities that are highly suggestive of venous wounds. Nonetheless, peripheral arterial disease might be contributing to the recurrent ulceration as well as slow healing especially on the left side. We repeated vascular studies today which showed an ABI of 0.95 on the right and 0.74 on the left with monophasic waveforms. Duplex showed borderline stenosis in the right external iliac artery, moderate stenosis in the right TP trunk and moderate stenosis in the left common femoral artery, ostial SFA and TP trunk. Due to all of this, I think it's best to proceed with angiography to evaluate her circulation and the need for further revascularization to assist with wound healing. In the meanwhile, continue wound care. I discussed the procedure in details as well as risks and benefits.  2. Essential hypertension: Blood pressure is mildly elevated today.   Disposition:   FU with me in 1 month.  Signed,  Lorine Bears, MD  02/20/2017 4:09 PM    [facility]

## 2017-02-24 NOTE — Discharge Instructions (Signed)

## 2017-02-24 NOTE — Interval H&P Note (Signed)
History and Physical Interval Note:  02/24/2017 10:15 AM  [name]  has presented today for surgery, with the diagnosis of pad  The various methods of treatment have been discussed with the patient and family. After consideration of risks, benefits and other options for treatment, the patient has consented to  Procedure(s): Abdominal Aortogram w/Lower Extremity (N/A) as a surgical intervention .  The patient's history has been reviewed, patient examined, no change in status, stable for surgery.  I have reviewed the patient's chart and labs.  Questions were answered to the patient's satisfaction.     Lorine BearsMuhammad Arida

## 2017-02-24 NOTE — Progress Notes (Signed)
Site area: Medical illustratort fem art Site Prior to Removal:  Level 0 Pressure Applied For: 30 min Manual:   Yes Patient Status During Pull:  A/O Post Pull Site:  Level 0 Post Pull Instructions Given:  Post instructions given and pt understands Post Pull Pulses Present: Bilateral legs wrapped. Rt leg is warm to knee. Left leg wrapped and is warm to the knee. Dressing Applied:  4x4 and a tegaderm Comments:Pt leaves cath holding area in stable condition. Rt groin is unremarkable. Dressing is CDI.

## 2017-02-25 ENCOUNTER — Encounter: Payer: Medicare Other | Admitting: Surgery

## 2017-02-25 ENCOUNTER — Encounter (HOSPITAL_COMMUNITY): Payer: Self-pay | Admitting: Cardiovascular Disease

## 2017-02-25 DIAGNOSIS — I87333 Chronic venous hypertension (idiopathic) with ulcer and inflammation of bilateral lower extremity: Secondary | ICD-10-CM | POA: Diagnosis not present

## 2017-02-26 ENCOUNTER — Telehealth: Payer: Self-pay | Admitting: Cardiovascular Disease

## 2017-02-26 NOTE — Telephone Encounter (Signed)
Spoke with patient and let her know that I do not see a referral in for a surgeon but I do see where she is seeing Dr. Kirke CorinArida for her veins. Let her know that I would see if I get her in to see him sooner to discuss this and she verbalized agreement with this plan. Let her know that I would call back if we can get her in sooner. Also let her know that her daughter can call back as well. She was appreciative for the call and had no further questions at this time.

## 2017-02-26 NOTE — Telephone Encounter (Signed)
I do not see any referral mentioned but will route to Dr. Kirke CorinArida.

## 2017-02-26 NOTE — Progress Notes (Signed)
MATTISON, GOLAY ([id number]) Visit Report for 02/25/2017 Arrival Information Details [name], [name]. Date of Service: 02/25/2017 3:30 PM Medical Record Number: [id number] Patient Account Number: [id number] Date of Birth/Sex: August 28, 1931 (83 y.o. Female) Treating RN: Huel Coventry Primary Care Tajai Suder: Guy Begin Other Clinician: Referring Modine Oppenheimer: Guy Begin Treating Rhondalyn Clingan/Extender: Rudene Re in Treatment: 2 Visit Information History Since Last Visit All ordered tests and consults were completed: No Patient Arrived: Wheel Chair Added or deleted any medications: No Arrival Time: 15:45 Any new allergies or adverse reactions: No Accompanied By: daughter Had a fall or experienced change in No activities of daily living that may affect Transfer Assistance: None risk of falls: Secondary Verification Process Yes Signs or symptoms of abuse/neglect since last No Completed: visito Patient Requires Transmission-Based No Hospitalized since last visit: No Precautions: Has Dressing in Place as Prescribed: Yes Patient Has Alerts: No Has Compression in Place as Prescribed: Yes Pain Present Now: Yes Electronic Signature(s) Signed: 02/25/2017 5:26:13 PM By: Elliot Gurney, RN, BSN, Kim RN, BSN Entered By: Elliot Gurney, RN, BSN, Kim on 02/25/2017 15:46:05 Manning, [name] ([id number]) -------------------------------------------------------------------------------- Clinic Level of Care Assessment Details [name] Date of Service: 02/25/2017 3:30 PM Medical Record Number: [id number] Patient Account Number: [id number] Date of Birth/Sex: [birth year] (83 y.o. Female) Treating RN: Huel Coventry Primary Care Jossie Smoot: Guy Begin Other Clinician: Referring Rondey Fallen: Guy Begin Treating Alik Mawson/Extender: Rudene Re in Treatment: 2 Clinic Level of Care Assessment Items TOOL 3 Quantity Score []  - Use when EandM and Procedure is performed on FOLLOW-UP  visit 0 ASSESSMENTS - Nursing Assessment / Reassessment X - Reassessment of Co-morbidities (includes updates in patient status) 1 10 []  - Reassessment of Adherence to Treatment Plan 0 ASSESSMENTS - Wound and Skin Assessment / Reassessment []  - Points for Wound Assessment can only be taken for a new wound of unknown 0 or different etiology and a procedure is NOT performed to that wound []  - Simple Wound Assessment / Reassessment - one wound 0 []  - Complex Wound Assessment / Reassessment - multiple wounds 0 []  - Dermatologic / Skin Assessment (not related to wound area) 0 ASSESSMENTS - Focused Assessment []  - Circumferential Edema Measurements - multi extremities 0 []  - Nutritional Assessment / Counseling / Intervention 0 []  - Lower Extremity Assessment (monofilament, tuning fork, pulses) 0 []  - Peripheral Arterial Disease Assessment (using hand held doppler) 0 ASSESSMENTS - Ostomy and/or Continence Assessment and Care []  - Incontinence Assessment and Management 0 []  - Ostomy Care Assessment and Management (repouching, etc.) 0 PROCESS - Coordination of Care []  - Points for Discharge Coordination can only be taken for a new wound of 0 unknown or different etiology and a procedure is NOT performed to that wound []  - Simple Patient / Family Education for ongoing care 0 []  - Complex (extensive) Patient / Family Education for ongoing care 0 ELMER, BOUTELLE ([id number]) []  - Staff obtains Consents, Records, Test Results / Process Orders 0 []  - Staff telephones HHA, Nursing Homes / Clarify orders / etc 0 []  - Routine Transfer to another Facility (non-emergent condition) 0 []  - Routine Hospital Admission (non-emergent condition) 0 []  - New Admissions / Manufacturing engineer / Ordering NPWT, Apligraf, etc. 0 []  - Emergency Hospital Admission (emergent condition) 0 []  - Simple Discharge Coordination 0 []  - Complex (extensive) Discharge Coordination 0 PROCESS - Special Needs []  - Pediatric /  Minor Patient Management 0 []  - Isolation Patient Management 0 []  - Hearing / Language / Visual  special needs 0 []  - Assessment of Community assistance (transportation, D/C planning, etc.) 0 []  - Additional assistance / Altered mentation 0 []  - Support Surface(s) Assessment (bed, cushion, seat, etc.) 0 INTERVENTIONS - Wound Cleansing / Measurement []  - Points for Wound Cleaning / Measurement, Wound Dressing, Specimen 0 Collection and Specimen taken to lab can only be taken for a new wound of unknown or different etiology and a procedure is NOT performed to that wound []  - Simple Wound Cleansing - one wound 0 []  - Complex Wound Cleansing - multiple wounds 0 []  - Wound Imaging (photographs - any number of wounds) 0 []  - Wound Tracing (instead of photographs) 0 []  - Simple Wound Measurement - one wound 0 []  - Complex Wound Measurement - multiple wounds 0 INTERVENTIONS - Wound Dressings []  - Small Wound Dressing one or multiple wounds 0 Felty, Kazi J. ([id number]) []  - Medium Wound Dressing one or multiple wounds 0 []  - Large Wound Dressing one or multiple wounds 0 INTERVENTIONS - Miscellaneous []  - External ear exam 0 []  - Specimen Collection (cultures, biopsies, blood, body fluids, etc.) 0 []  - Specimen(s) / Culture(s) sent or taken to Lab for analysis 0 []  - Patient Transfer (multiple staff / Nurse, adult / Similar devices) 0 []  - Simple Staple / Suture removal (25 or less) 0 []  - Complex Staple / Suture removal (26 or more) 0 []  - Hypo / Hyperglycemic Management (close monitor of Blood Glucose) 0 []  - Ankle / Brachial Index (ABI) - do not check if billed separately 0 X - Vital Signs 1 5 Has the patient been seen at the hospital within the last three years: Yes Total Score: 15 Level Of Care: New/Established - Level 1 Notes Infection present MD prescribed Abx Electronic Signature(s) Unsigned Entered By: Elliot Gurney, RN, BSN, Kim on 02/26/2017 10:30:19 Signature(s): Date(s): [name] ([id number]) -------------------------------------------------------------------------------- Encounter Discharge Information Details Patient Name: CYERA, BALBONI. Date of Service: 02/25/2017 3:30 PM Medical Record Number: [id number] Patient Account Number: [id number] Date of Birth/Sex: 11/19/1931 (83 y.o. Female) Treating RN: Huel Coventry Primary Care Retia Cordle: Guy Begin Other Clinician: Referring Jamari Moten: Guy Begin Treating Taryn Shellhammer/Extender: Rudene Re in Treatment: 2 Encounter Discharge Information Items Schedule Follow-up Appointment: No Medication Reconciliation completed No and provided to Patient/Care Lyndsie Wallman: Provided on Clinical Summary of Care: 02/25/2017 Form Type Recipient Paper Patient CF Electronic Signature(s) Signed: 02/25/2017 4:27:55 PM By: Gwenlyn Perking Entered By: Gwenlyn Perking on 02/25/2017 16:27:55 Mcgonagle, [name] ([id number]) -------------------------------------------------------------------------------- Lower Extremity Assessment Details Patient Name: [name]. Date of Service: 02/25/2017 3:30 PM Medical Record Number: [id number] Patient Account Number: [id number] Date of Birth/Sex: [birth year] (83 y.o. Female) Treating RN: Huel Coventry Primary Care Shane Melby: Guy Begin Other Clinician: Referring Kristina Mcnorton: Guy Begin Treating Rolf Fells/Extender: Rudene Re in Treatment: 2 Vascular Assessment Claudication: Claudication Assessment [Left:None] [Right:None] Pulses: Dorsalis Pedis Palpable: [Left:Yes] [Right:Yes] Posterior Tibial Extremity colors, hair growth, and conditions: Extremity Color: [Left:Hyperpigmented] [Right:Hyperpigmented] Hair Growth on Extremity: [Left:No] [Right:No] Temperature of Extremity: [Left:Warm] Capillary Refill: [Left:> 3 seconds] [Right:> 3 seconds] Dependent Rubor: [Left:No] [Right:No] Blanched when Elevated: [Left:No] [Right:No] Lipodermatosclerosis: [Left:Yes]  [Right:Yes] Toe Nail Assessment Left: Right: Thick: Yes Yes Discolored: Yes Yes Deformed: No No Improper Length and Hygiene: No No Electronic Signature(s) Signed: 02/25/2017 5:26:13 PM By: Elliot Gurney, RN, BSN, Kim RN, BSN Entered By: Elliot Gurney, RN, BSN, Kim on 02/25/2017 15:55:23 Cordaro, [name] ([id number]) -------------------------------------------------------------------------------- Multi Wound Chart Details Patient Name: [name]. Date of Service: 02/25/2017 3:30 PM Medical Record Number:  [id number] Patient Account Number: [id number] Date of Birth/Sex: 24-Sep-1931 (83 y.o. Female) Treating RN: Huel Coventry Primary Care Brie Eppard: Guy Begin Other Clinician: Referring Kelwin Gibler: Guy Begin Treating Alex Leahy/Extender: Rudene Re in Treatment: 2 Vital Signs Height(in): 66 Pulse(bpm): 73 Weight(lbs): 159 Blood Pressure 119/55 (mmHg): Body Mass Index(BMI): 26 Temperature(F): 98 Respiratory Rate 16 (breaths/min): Photos: [2:No Photos] [3:No Photos] [4:No Photos] Wound Location: [2:Right, Lateral Malleolus] [3:Left, Medial Lower Leg] [4:Right, Medial Lower Leg] Wounding Event: [2:Gradually Appeared] [3:Gradually Appeared] [4:Gradually Appeared] Primary Etiology: [2:Venous Leg Ulcer] [3:Venous Leg Ulcer] [4:Venous Leg Ulcer] Date Acquired: [2:01/19/2017] [3:01/19/2017] [4:02/16/2017] Weeks of Treatment: [2:2] [3:2] [4:1] Wound Status: [2:Open] [3:Open] [4:Open] Measurements L x W x D 5x3.2x0.2 [3:3.2x1.8x0.1] [4:10x5.5x0.1] (cm) Area (cm) : [2:12.566] [3:4.524] [4:43.197] Volume (cm) : [2:2.513] [3:0.452] [4:4.32] % Reduction in Area: [2:-203.60%] [3:-1051.10%] [4:-27414.00%] % Reduction in Volume: -507.00% [3:-1059.00%] [4:-26900.00%] Classification: [2:Partial Thickness] [3:Partial Thickness] [4:Partial Thickness] Periwound Skin Texture: No Abnormalities Noted [3:No Abnormalities Noted] [4:No Abnormalities Noted] Periwound Skin [2:No Abnormalities Noted] [3:No  Abnormalities Noted] [4:No Abnormalities Noted] Moisture: Periwound Skin Color: No Abnormalities Noted [3:No Abnormalities Noted] [4:No Abnormalities Noted] Tenderness on [2:No] [3:No] [4:No] Treatment Notes Electronic Signature(s) Signed: 02/25/2017 4:19:50 PM By: Evlyn Kanner MD, FACS Entered By: Evlyn Kanner on 02/25/2017 16:19:49 Toulouse, [name] ([id number]) -------------------------------------------------------------------------------- Multi-Disciplinary Care Plan Details Patient Name: [name]. Date of Service: 02/25/2017 3:30 PM Medical Record Number: [id number] Patient Account Number: [id number] Date of Birth/Sex: Jul 16, 1931 (83 y.o. Female) Treating RN: Huel Coventry Primary Care Vester Balthazor: Guy Begin Other Clinician: Referring Cecilie Heidel: Guy Begin Treating Savan Ruta/Extender: Rudene Re in Treatment: 2 Active Inactive ` Abuse / Safety / Falls / Self Care Management Nursing Diagnoses: Potential for falls Goals: Patient will remain injury free Date Initiated: 02/09/2017 Target Resolution Date: 04/17/2017 Goal Status: Active Interventions: Assess fall risk on admission and as needed Assess self care needs on admission and as needed Notes: ` Nutrition Nursing Diagnoses: Imbalanced nutrition Goals: Patient/caregiver agrees to and verbalizes understanding of need to use nutritional supplements and/or vitamins as prescribed Date Initiated: 02/09/2017 Target Resolution Date: 04/17/2017 Goal Status: Active Interventions: Assess patient nutrition upon admission and as needed per policy Notes: ` Orientation to the Wound Care Program Nursing Diagnoses: SHAVANNA, FURNARI ([id number]) Knowledge deficit related to the wound healing center program Goals: Patient/caregiver will verbalize understanding of the Wound Healing Center Program Date Initiated: 02/09/2017 Target Resolution Date: 02/20/2017 Goal Status: Active Interventions: Provide education on  orientation to the wound center Notes: ` Pain, Acute or Chronic Nursing Diagnoses: Pain, acute or chronic: actual or potential Potential alteration in comfort, pain Goals: Patient/caregiver will verbalize adequate pain control between visits Date Initiated: 02/09/2017 Target Resolution Date: 04/17/2017 Goal Status: Active Interventions: Assess comfort goal upon admission Complete pain assessment as per visit requirements Notes: ` Wound/Skin Impairment Nursing Diagnoses: Impaired tissue integrity Knowledge deficit related to smoking impact on wound healing Knowledge deficit related to ulceration/compromised skin integrity Goals: Ulcer/skin breakdown will have a volume reduction of 80% by week 12 Date Initiated: 02/09/2017 Target Resolution Date: 04/10/2017 Goal Status: Active Interventions: Assess patient/caregiver ability to perform ulcer/skin care regimen upon admission and as needed Assess ulceration(s) every visit CHANTELLE, VERDI ([id number]) Notes: Electronic Signature(s) Signed: 02/25/2017 5:26:13 PM By: Elliot Gurney, RN, BSN, Kim RN, BSN Entered By: Elliot Gurney, RN, BSN, Kim on 02/25/2017 16:21:08 RESHANDA, LEWEY ([id number]) -------------------------------------------------------------------------------- Pain Assessment Details Patient Name: [name]. Date of Service: 02/25/2017 3:30 PM Medical Record Number: [id number] Patient Account Number: [id number] Date of Birth/Sex:  11/01/1931 (83 y.o. Female) Treating RN: Huel CoventryWoody, Kim Primary Care Zamora Colton: [name], NARGES Other Clinician: Referring Rilley Poulter: [name], NARGES Treating Khaya Theissen/Extender: Rudene ReBritto, Errol Weeks in Treatment: 2 Active Problems Location of Pain Severity and Description of Pain Patient Has Paino No Site Locations With Dressing Change: No Duration of the Pain. Constant / Intermittento Constant Rate the pain. Current Pain Level: 10 Character of Pain Describe the Pain: Shooting, Tender, Throbbing Pain  Management and Medication Current Pain Management: Electronic Signature(s) Signed: 02/25/2017 5:26:13 PM By: Elliot GurneyWoody, RN, BSN, Kim RN, BSN Entered By: Elliot GurneyWoody, RN, BSN, Kim on 02/25/2017 15:46:55 Stafford, [name] ([id number]) -------------------------------------------------------------------------------- Wound Assessment Details Patient Name: Alesia BandaFULLER, Lylla J. Date of Service: 02/25/2017 3:30 PM Medical Record Number: [id number] Patient Account Number: [id number] Date of Birth/Sex: 11/01/1931 (83 y.o. Female) Treating RN: Huel CoventryWoody, Kim Primary Care Jillyan Plitt: [name], NARGES Other Clinician: Referring Jacoby Ritsema: [name], NARGES Treating Marrianne Sica/Extender: Rudene ReBritto, Errol Weeks in Treatment: 2 Wound Status Wound Number: 2 Primary Etiology: Venous Leg Ulcer Wound Location: Right, Lateral Malleolus Wound Status: Open Wounding Event: Gradually Appeared Date Acquired: 01/19/2017 Weeks Of Treatment: 2 Clustered Wound: No Wound Measurements Length: (cm) 5 Width: (cm) 3.2 Depth: (cm) 0.2 Area: (cm) 12.566 Volume: (cm) 2.513 % Reduction in Area: -203.6% % Reduction in Volume: -507% Wound Description Classification: Partial Thickness Periwound Skin Texture Texture Color No Abnormalities Noted: No No Abnormalities Noted: No Moisture No Abnormalities Noted: No Electronic Signature(s) Signed: 02/25/2017 5:26:13 PM By: Elliot GurneyWoody, RN, BSN, Kim RN, BSN Entered By: Elliot GurneyWoody, RN, BSN, Kim on 02/25/2017 15:48:39 Hainline, [name] ([id number]) -------------------------------------------------------------------------------- Wound Assessment Details Patient Name: Alesia BandaFULLER, Haneefah J. Date of Service: 02/25/2017 3:30 PM Medical Record Number: [id number] Patient Account Number: [id number] Date of Birth/Sex: 11/01/1931 (83 y.o. Female) Treating RN: Huel CoventryWoody, Kim Primary Care Mattheus Rauls: [name], NARGES Other Clinician: Referring Kyel Purk: [name], NARGES Treating Lenzy Kerschner/Extender: Rudene ReBritto, Errol Weeks in Treatment:  2 Wound Status Wound Number: 3 Primary Etiology: Venous Leg Ulcer Wound Location: Left, Medial Lower Leg Wound Status: Open Wounding Event: Gradually Appeared Date Acquired: 01/19/2017 Weeks Of Treatment: 2 Clustered Wound: No Wound Measurements Length: (cm) 3.2 Width: (cm) 1.8 Depth: (cm) 0.1 Area: (cm) 4.524 Volume: (cm) 0.452 % Reduction in Area: -1051.1% % Reduction in Volume: -1059% Wound Description Classification: Partial Thickness Periwound Skin Texture Texture Color No Abnormalities Noted: No No Abnormalities Noted: No Moisture No Abnormalities Noted: No Electronic Signature(s) Signed: 02/25/2017 5:26:13 PM By: Elliot GurneyWoody, RN, BSN, Kim RN, BSN Entered By: Elliot GurneyWoody, RN, BSN, Kim on 02/25/2017 15:48:39 Nusz, [name] ([id number]) -------------------------------------------------------------------------------- Wound Assessment Details Patient Name: Alesia BandaFULLER, Shakendra J. Date of Service: 02/25/2017 3:30 PM Medical Record Number: [id number] Patient Account Number: [id number] Date of Birth/Sex: 11/01/1931 (83 y.o. Female) Treating RN: Huel CoventryWoody, Kim Primary Care Homero Hyson: [name], NARGES Other Clinician: Referring [name] Sultan: [name], NARGES Treating Sorah Falkenstein/Extender: Rudene ReBritto, Errol Weeks in Treatment: 2 Wound Status Wound Number: 4 Primary Etiology: Venous Leg Ulcer Wound Location: Right, Medial Lower Leg Wound Status: Open Wounding Event: Gradually Appeared Date Acquired: 02/16/2017 Weeks Of Treatment: 1 Clustered Wound: No Wound Measurements Length: (cm) 10 Width: (cm) 5.5 Depth: (cm) 0.1 Area: (cm) 43.197 Volume: (cm) 4.32 % Reduction in Area: -27414% % Reduction in Volume: -26900% Wound Description Classification: Partial Thickness Periwound Skin Texture Texture Color No Abnormalities Noted: No No Abnormalities Noted: No Moisture No Abnormalities Noted: No Electronic Signature(s) Signed: 02/25/2017 5:26:13 PM By: Elliot GurneyWoody, RN, BSN, Kim RN, BSN Entered By: Elliot GurneyWoody,  RN, BSN, Kim on 02/25/2017 15:48:39 Desir, [name] ([id number]) -------------------------------------------------------------------------------- Vitals Details Patient Name: Alesia BandaFULLER, Caysie J.  Date of Service: 02/25/2017 3:30 PM Medical Record Number: [id number] Patient Account Number: [id number] Date of Birth/Sex: 1931-12-14 (83 y.o. Female) Treating RN: Huel Coventry Primary Care Nashly Olsson: Guy Begin Other Clinician: Referring Josie Mesa: Guy Begin Treating Ecko Beasley/Extender: Rudene Re in Treatment: 2 Vital Signs Time Taken: 15:46 Temperature (F): 98 Height (in): 66 Pulse (bpm): 73 Weight (lbs): 159 Respiratory Rate (breaths/min): 16 Body Mass Index (BMI): 25.7 Blood Pressure (mmHg): 119/55 Reference Range: 80 - 120 mg / dl Electronic Signature(s) Signed: 02/25/2017 5:26:13 PM By: Elliot Gurney, RN, BSN, Kim RN, BSN Entered By: Elliot Gurney, RN, BSN, Kim on 02/25/2017 15:47:18

## 2017-02-26 NOTE — Telephone Encounter (Signed)
Pt daughter calling stating we were to referrer to a surgeon in [place]  For her veins  She would like to know if that has been done and if so please give her a call back Please advise

## 2017-02-26 NOTE — Progress Notes (Addendum)
Jordan, Hopkins ([id number]) Visit Report for 02/25/2017 Chief Complaint Document Details [name], [name]. Date of Service: 02/25/2017 3:30 PM Medical Record Number: [id number] Patient Account Number: [id number] Date of Birth/Sex: 1931/01/04 (83 y.o. Female) Treating RN: Huel Coventry Primary Care Provider: Guy Begin Other Clinician: Referring Provider: Guy Begin Treating Provider/Extender: Rudene Re in Treatment: 2 Information Obtained from: Patient Chief Complaint 06/10/16; the patient is here for review of a wound on the right anterior medial lower leg above the ankle Electronic Signature(s) Signed: 02/25/2017 4:22:03 PM By: Evlyn Kanner MD, FACS Entered By: Evlyn Kanner on 02/25/2017 16:22:03 [name] ([id number]) -------------------------------------------------------------------------------- Debridement Details [name] Date of Service: 02/25/2017 3:30 PM Medical Record Number: [id number] Patient Account Number: [id number] Date of Birth/Sex: May 17, 1931 (83 y.o. Female) Treating RN: Huel Coventry Primary Care Provider: Guy Begin Other Clinician: Referring Provider: Guy Begin Treating Provider/Extender: Rudene Re in Treatment: 2 Debridement Performed for Wound #2 Right,Lateral Malleolus Assessment: Performed By: Physician Evlyn Kanner, MD Debridement: Debridement Pre-procedure Yes - 04:10 Verification/Time Out Taken: Start Time: 04:10 Pain Control: Lidocaine 5% topical ointment Level: Skin/Subcutaneous Tissue Total Area Debrided (L x 5 (cm) x 3 (cm) = 15 (cm) W): Tissue and other Viable, Non-Viable, Fibrin/Slough, Subcutaneous material debrided: Instrument: Curette Bleeding: Minimum Hemostasis Achieved: Pressure End Time: 04:13 Procedural Pain: 3 Post Procedural Pain: 0 Response to Treatment: Procedure was tolerated well Post Debridement Measurements of Total Wound Length: (cm) 5 Width:  (cm) 3.2 Depth: (cm) 0.3 Volume: (cm) 3.77 Character of Wound/Ulcer Post Requires Further Debridement Debridement: Severity of Tissue Post Debridement: Fat layer exposed Post Procedure Diagnosis Same as Pre-procedure Electronic Signature(s) Signed: 02/25/2017 4:21:10 PM By: Evlyn Kanner MD, FACS Signed: 02/25/2017 5:26:13 PM By: Elliot Gurney, RN, BSN, Kim RN, BSN Entered By: Evlyn Kanner on 02/25/2017 16:21:09 CHRISTEE, MERVINE ([id number]) Dobies, Numa Shela Commons ([id number]) -------------------------------------------------------------------------------- Debridement Details Patient Name: Jordan, Hopkins. Date of Service: 02/25/2017 3:30 PM Medical Record Number: [id number] Patient Account Number: [id number] Date of Birth/Sex: Dec 01, 1931 (83 y.o. Female) Treating RN: Huel Coventry Primary Care Provider: Guy Begin Other Clinician: Referring Provider: Guy Begin Treating Provider/Extender: Rudene Re in Treatment: 2 Debridement Performed for Wound #3 Left,Medial Lower Leg Assessment: Performed By: Physician Evlyn Kanner, MD Debridement: Debridement Pre-procedure Yes - 04:10 Verification/Time Out Taken: Start Time: 04:10 Pain Control: Lidocaine 5% topical ointment Level: Skin/Subcutaneous Tissue Total Area Debrided (L x 3 (cm) x 2 (cm) = 6 (cm) W): Tissue and other Viable, Non-Viable, Fibrin/Slough, Subcutaneous material debrided: Instrument: Curette Bleeding: Minimum Hemostasis Achieved: Pressure End Time: 04:13 Procedural Pain: 3 Post Procedural Pain: 0 Response to Treatment: Procedure was tolerated well Post Debridement Measurements of Total Wound Length: (cm) 3.2 Width: (cm) 1.8 Depth: (cm) 0.2 Volume: (cm) 0.905 Character of Wound/Ulcer Post Requires Further Debridement Debridement: Severity of Tissue Post Debridement: Fat layer exposed Post Procedure Diagnosis Same as Pre-procedure Electronic Signature(s) Signed: 02/25/2017 4:21:54 PM By: Evlyn Kanner  MD, FACS Signed: 02/25/2017 5:26:13 PM By: Elliot Gurney, RN, BSN, Kim RN, BSN Entered By: Evlyn Kanner on 02/25/2017 16:21:54 ZILLA, SHARTZER ([id number]) Goldman, Allianna Shela Commons ([id number]) -------------------------------------------------------------------------------- HPI Details [name] Date of Service: 02/25/2017 3:30 PM Medical Record Number: [id number] Patient Account Number: [id number] Date of Birth/Sex: 16-Mar-1931 (83 y.o. Female) Treating RN: Huel Coventry Primary Care Provider: Guy Begin Other Clinician: Referring Provider: Guy Begin Treating Provider/Extender: Rudene Re in Treatment: 2 History of Present Illness HPI Description: 06/10/16; this is an 83 year old woman who lives  near [place] with family members. She saw her primary doctor roughly over a week ago and was referred to the ER for a cellulitis in the right medial leg. She was given a round of IV antibiotics in the emergency room and discharged on oral Keflex which she is taking. She is been left with a uncomfortable leg area on the right medial lower leg. Her ABIs in this clinic were 0.8 on the right 0.6 on the left. She does not describe claudication. She is not a diabetic. Lab work in the ER showed a normal BUN and creatinine on 6/20 at 18 and 0.92 respectively white count was 10.1 hemoglobin at 13.8. She did not have any imaging studies. I can see no vascular evaluations in Epic. She does not have a prior wound history 06/17/16; the area affected on her right medial ankle is an area I thought was due to tissue damage from cellulitis last week. She is going for venous reflux studies this afternoon, these were not ordered in this clinic at least not by me. The patient does not give a prior history of damage to the skin in this area although I wonder if she actually might not of noticed it. We have been using's Aquacel Ag under a wrap although we will not be able to wrap her today 06/24/16: pt  reports today she hasn't heard from the vascular clinic regarding proceeding with arterial studies. our office will assist with this today. she denies s/s of infection. denies problems with her wraps. 07/07/16; since I last saw this woman she was admitted to hospital from 7/16 through 7/18 with sepsis syndrome felt to be secondary to her ulcer. She was treated with Vanco and Zosyn the hospital discharged on Septra. Blood cultures were negative. Urine culture showed multiple species she is now feeling well using Santyl with border foam to her wound. [facility] 07/14/16; no major improvement here. She has a quarter-sized wound covered with thick adherent slough on the lateral aspect of her right leg with several surrounding satellite lesions in a similar state. She has had 2 recent bouts of cellulitis, one before she came to this clinic and one required a recent hospitalization. I have asked for arterial studies I don't believe these have ever been done. My notes suggest from early in July that she went for reflux studies although I don't know that I have ever seen these either, she did have a DVT rule out but I did not order this, this did not show a DVT but did show a Baker's cyst 07/21/2016 -- the patient had an arterial duplex study done with waveform suggestive of right femoropopliteal disease. There is also small vessel disease bilaterally. Right ABI was in the normal range of 1.0 and a TBI was 0.74. Left ABI is abnormal at 0.69 with a TBI of 0.40. Three-vessel runoff on the right and a two-vessel runoff on the left. She has an appointment to see Dr. Kirke Corin later today. 07/28/2016 -- was seen by Dr. Kirke Corin who reviewed her arterial studies with an ABI of 0.94 on the right and 0.69 on the left with normal right toe brachial index. Duplex showed diffuse atherosclerosis without discrete stenosis and there was a three-vessel runoff on the right below the knee. Based on these studies and  the location of the ulcer he thought it was venous commended she continue with wound care consults. BETTEJANE, LEAVENS ([id number]) 08/04/16; I note the patient's reasonably functional arterial supply which  is fortunate. The wound bed appears to be smaller. Debrided of surface slough. She is been using Iodoflex 08/11/16: Only a very small open area remains. However it has some depth 08/18/16; small open area is smaller. Still open however. 08/25/16; the area has totally closed down and epithelialized. READMISSION 02/09/17; this is an 83 year old woman who is not a diabetic or smoker. She was in our clinic in the summer into mid-September with a wound on her right medial calf that. This eventually closed down. I thought this was mostly venous. She did have evidence of PAD and she was seen by Dr. Kirke Corin. At that point she had an ABI of 0.94 on the right 0.69 on the left with a normal right toe brachial index. It was felt that she had enough blood flow to close her wound and that happened. She was discharged with stockings although I don't get the sense that she really use them she tells me that roughly a week or 2 she developed swelling in her bilateral lower legs and then noted wounds on her right lateral malleolus and left medial calf. The on this she is not really sure how this happened. She is not having a lot of pain although she is not ambulatory that much only in her home. Other than weight loss she has not noticed any other issues no specific claudication no chest pain. 02/16/17; the patient continues with 2 wounds on the right lateral malleolus and left medial calf. Both of these are painful. She has chronic venous insufficiency but also known PAD and is previously seen Dr. Kirke Corin. We have arranged vascular follow-up with Dr. Kirke Corin to look at the arterial situation 02/25/2017 -- patient was brought in today because she missed her appointment last week with Dr. Leanord Hawking and is having a lot of pain and  drainage from her wounds. The interim she was seen by Dr. Bascom Levels who did a duplex of the right lower extremity which shows a right ABI of 0.95 and left ABI 0.74. There was also a abdominal aortogram with a bilateral extremity angiography done for significant peripheral vascular disease. The right lower extremity showed diffuse SFA and popliteal disease with significant stenosis affecting the TP trunk just before the origin of the peritoneal artery. The left lower extremity showed moderate left SFA stenosis with occluded popliteal artery and tibioperoneal vessels with only visible collateral. The opinion was the patient had no endovascular surgical revascularization options of the left lower extremity and Dr. Bascom Levels would discuss it with Dr. Randie Heinz for a second opinion. Endovascular intervention on the right TP trunk could be considered. Electronic Signature(s) Signed: 02/25/2017 4:25:25 PM By: Evlyn Kanner MD, FACS Entered By: Evlyn Kanner on 02/25/2017 16:25:24 [name] ([id number]) -------------------------------------------------------------------------------- Physical Exam Details Patient Name: DONNIE, GEDEON. Date of Service: 02/25/2017 3:30 PM Medical Record Number: [id number] Patient Account Number: [id number] Date of Birth/Sex: 12/01/1931 (83 y.o. Female) Treating RN: Huel Coventry Primary Care Provider: Guy Begin Other Clinician: Referring Provider: Guy Begin Treating Provider/Extender: Rudene Re in Treatment: 2 Constitutional . Pulse regular. Respirations normal and unlabored. Afebrile. . Eyes Nonicteric. Reactive to light. Ears, Nose, Mouth, and Throat Lips, teeth, and gums WNL.Marland Kitchen Moist mucosa without lesions. Neck supple and nontender. No palpable supraclavicular or cervical adenopathy. Normal sized without goiter. Respiratory WNL. No retractions.. Breath sounds WNL, No rubs, rales, rhonchi, or wheeze.. Cardiovascular Heart rhythm and rate regular,  no murmur or gallop.. Pedal Pulses WNL. No clubbing, cyanosis or edema. Chest Breasts symmetical and  no nipple discharge.. Breast tissue WNL, no masses, lumps, or tenderness.. Lymphatic No adneopathy. No adenopathy. No adenopathy. Musculoskeletal Adexa without tenderness or enlargement.. Digits and nails w/o clubbing, cyanosis, infection, petechiae, ischemia, or inflammatory conditions.. Integumentary (Hair, Skin) No suspicious lesions. No crepitus or fluctuance. No peri-wound warmth or erythema. No masses.Marland Kitchen. Psychiatric Judgement and insight Intact.. No evidence of depression, anxiety, or agitation.. Notes the right lateral ankle and the left medial ankle was sharply debrided with a #3 curet and minimal bleeding controlled with pressure. The right medial ankle has a large area of excoriation and ulceration and this was not sharply debrided. Electronic Signature(s) Signed: 02/25/2017 4:26:06 PM By: Evlyn KannerBritto, Kandiss Ihrig MD, FACS Entered By: Evlyn KannerBritto, Eben Choinski on 02/25/2017 16:26:05 Jordan BandaFULLER, Tonnya J. ([id number]) -------------------------------------------------------------------------------- Physician Orders Details Patient Name: Jordan BandaFULLER, Pheobe J. Date of Service: 02/25/2017 3:30 PM Medical Record Number: [id number] Patient Account Number: [id number] Date of Birth/Sex: 01-Jan-1931 (83 y.o. 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 o Cleanse wound with mild soap and water Wound #3 Left,Medial Lower Leg o Clean wound with Normal Saline. o Cleanse wound with mild soap and water o Clean wound with Normal Saline. o Cleanse wound with mild soap and water Wound #4 Right,Medial Lower Leg o Clean wound with Normal Saline. o Cleanse wound with mild soap and water Anesthetic Wound #2 Right,Lateral Malleolus o Topical Lidocaine 4% cream applied to wound bed prior to debridement - clinic use only Wound #3 Left,Medial Lower Leg o Topical Lidocaine 4% cream applied to wound bed prior to debridement - clinic use only Wound #4 Right,Medial Lower Leg o Topical Lidocaine 4% cream applied to wound bed prior to debridement - clinic use only Primary Wound Dressing Wound #2 Right,Lateral Malleolus Christoffel, Bella J. ([id number]) o Santyl Ointment Wound #3 Left,Medial Lower Leg o Santyl Ointment Wound #4 Right,Medial Lower Leg o Aquacel Ag Secondary Dressing Wound #2 Right,Lateral Malleolus o ABD pad o Gauze and Kerlix/Conform Wound #3 Left,Medial Lower Leg o ABD pad o Gauze and Kerlix/Conform Wound #4 Right,Medial Lower Leg o ABD pad o Gauze and Kerlix/Conform Dressing Change Frequency Wound #2 Right,Lateral Malleolus o Change dressing every week Wound #3 Left,Medial Lower Leg o Change dressing every week Wound #4 Right,Medial Lower Leg o Change dressing every week Follow-up Appointments Wound #2 Right,Lateral Malleolus o Return Appointment in 1 week. Wound #3 Left,Medial Lower Leg o Return Appointment in 1 week. Wound #4 Right,Medial Lower Leg o Return Appointment in 1 week. Additional Orders / Instructions Wound #2 Right,Lateral Malleolus o Increase protein intake. Wound #3 Left,Medial Lower Leg o  Increase protein intake. Jordan BandaFULLER, Almetta J. ([id number]) Wound #4 Right,Medial Lower Leg o Increase protein intake. Electronic Signature(s) Signed: 02/25/2017 4:33:40 PM By: Evlyn KannerBritto, Ervan Heber MD, FACS Signed: 02/25/2017 5:26:13 PM By: Elliot GurneyWoody, RN, BSN, Kim RN, BSN Previous Signature: 02/25/2017 4:26:50 PM Version By: Evlyn KannerBritto, Anival Pasha MD, FACS Entered By: Elliot GurneyWoody, RN, BSN, Kim on 02/25/2017 16:27:29 Stuard, [employer] Mining[employer]  Shela Commons ([id number]) -------------------------------------------------------------------------------- Problem List Details Patient Name: JOLANE, BANKHEAD. Date of Service: 02/25/2017 3:30 PM Medical Record Number: [id number] Patient Account Number: [id number] Date of Birth/Sex: 1931/08/25 (83 y.o. Female) Treating RN: Huel Coventry Primary Care Provider: Guy Begin Other Clinician: Referring Provider: Guy Begin Treating Provider/Extender: Rudene Re in Treatment: 2 Active Problems ICD-10 Encounter Code Description Active Date Diagnosis I87.333 Chronic venous hypertension (idiopathic) with ulcer and 02/09/2017 Yes inflammation of bilateral lower extremity L97.313 Non-pressure chronic ulcer of right ankle with necrosis of 02/09/2017 Yes muscle L97.221 Non-pressure chronic ulcer of left calf limited to 02/09/2017 Yes breakdown of skin I70.233 Atherosclerosis of native arteries of right leg with 02/25/2017 Yes ulceration of ankle I70.243 Atherosclerosis of native arteries of left leg with ulceration 02/25/2017 Yes of ankle Inactive Problems Resolved Problems Electronic Signature(s) Signed: 02/25/2017 4:19:44 PM By: Evlyn Kanner MD, FACS Entered By: Evlyn Kanner on 02/25/2017 16:19:44 Dimitroff, [name] ([id number]) -------------------------------------------------------------------------------- Progress Note Details [name] Date of Service: 02/25/2017 3:30 PM Medical Record Number: [id number] Patient Account Number: [id number] Date of Birth/Sex: November 24, 1931 (83  y.o. Female) Treating RN: Huel Coventry Primary Care Provider: Guy Begin Other Clinician: Referring Provider: Guy Begin Treating Provider/Extender: Rudene Re in Treatment: 2 Subjective Chief Complaint Information obtained from Patient 06/10/16; the patient is here for review of a wound on the right anterior medial lower leg above the ankle History of Present Illness (HPI) 06/10/16; this is an 83 year old woman who lives near [place] with family members. She saw her primary doctor roughly over a week ago and was referred to the ER for a cellulitis in the right medial leg. She was given a round of IV antibiotics in the emergency room and discharged on oral Keflex which she is taking. She is been left with a uncomfortable leg area on the right medial lower leg. Her ABIs in this clinic were 0.8 on the right 0.6 on the left. She does not describe claudication. She is not a diabetic. Lab work in the ER showed a normal BUN and creatinine on 6/20 at 18 and 0.92 respectively white count was 10.1 hemoglobin at 13.8. She did not have any imaging studies. I can see no vascular evaluations in Epic. She does not have a prior wound history 06/17/16; the area affected on her right medial ankle is an area I thought was due to tissue damage from cellulitis last week. She is going for venous reflux studies this afternoon, these were not ordered in this clinic at least not by me. The patient does not give a prior history of damage to the skin in this area although I wonder if she actually might not of noticed it. We have been using's Aquacel Ag under a wrap although we will not be able to wrap her today 06/24/16: pt reports today she hasn't heard from the vascular clinic regarding proceeding with arterial studies. our office will assist with this today. she denies s/s of infection. denies problems with her wraps. 07/07/16; since I last saw this woman she was admitted to hospital from 7/16 through  7/18 with sepsis syndrome felt to be secondary to her ulcer. She was treated with Vanco and Zosyn the hospital discharged on Septra. Blood cultures were negative. Urine culture showed multiple species she is now feeling well using Santyl with border foam to her wound. [facility] 07/14/16; no major improvement here. She has a quarter-sized wound covered with thick adherent slough on the lateral aspect of  her right leg with several surrounding satellite lesions in a similar state. She has had 2 recent bouts of cellulitis, one before she came to this clinic and one required a recent hospitalization. I have asked for arterial studies I don't believe these have ever been done. My notes suggest from early in July that she went for reflux studies although I don't know that I have ever seen these either, she did have a DVT rule out but I did not order this, this did not show a DVT but did show a Baker's cyst 07/21/2016 -- the patient had an arterial duplex study done with waveform suggestive of right femoropopliteal disease. There is also small vessel disease bilaterally. Right ABI was in the normal range of 1.0 and a TBI was 0.74. Left ABI is abnormal at 0.69 with a TBI of 0.40. Three-vessel runoff on the right and a two-vessel runoff on the left. RAEGAN, WINDERS ([id number]) She has an appointment to see Dr. Kirke Corin later today. 07/28/2016 -- was seen by Dr. Kirke Corin who reviewed her arterial studies with an ABI of 0.94 on the right and 0.69 on the left with normal right toe brachial index. Duplex showed diffuse atherosclerosis without discrete stenosis and there was a three-vessel runoff on the right below the knee. Based on these studies and the location of the ulcer he thought it was venous commended she continue with wound care consults. 08/04/16; I note the patient's reasonably functional arterial supply which is fortunate. The wound bed appears to be smaller. Debrided of surface slough.  She is been using Iodoflex 08/11/16: Only a very small open area remains. However it has some depth 08/18/16; small open area is smaller. Still open however. 08/25/16; the area has totally closed down and epithelialized. READMISSION 02/09/17; this is an 83 year old woman who is not a diabetic or smoker. She was in our clinic in the summer into mid-September with a wound on her right medial calf that. This eventually closed down. I thought this was mostly venous. She did have evidence of PAD and she was seen by Dr. Kirke Corin. At that point she had an ABI of 0.94 on the right 0.69 on the left with a normal right toe brachial index. It was felt that she had enough blood flow to close her wound and that happened. She was discharged with stockings although I don't get the sense that she really use them she tells me that roughly a week or 2 she developed swelling in her bilateral lower legs and then noted wounds on her right lateral malleolus and left medial calf. The on this she is not really sure how this happened. She is not having a lot of pain although she is not ambulatory that much only in her home. Other than weight loss she has not noticed any other issues no specific claudication no chest pain. 02/16/17; the patient continues with 2 wounds on the right lateral malleolus and left medial calf. Both of these are painful. She has chronic venous insufficiency but also known PAD and is previously seen Dr. Kirke Corin. We have arranged vascular follow-up with Dr. Kirke Corin to look at the arterial situation 02/25/2017 -- patient was brought in today because she missed her appointment last week with Dr. Leanord Hawking and is having a lot of pain and drainage from her wounds. The interim she was seen by Dr. Bascom Levels who did a duplex of the right lower extremity which shows a right ABI of 0.95 and left ABI 0.74. There was  also a abdominal aortogram with a bilateral extremity angiography done for significant peripheral vascular  disease. The right lower extremity showed diffuse SFA and popliteal disease with significant stenosis affecting the TP trunk just before the origin of the peritoneal artery. The left lower extremity showed moderate left SFA stenosis with occluded popliteal artery and tibioperoneal vessels with only visible collateral. The opinion was the patient had no endovascular surgical revascularization options of the left lower extremity and Dr. Bascom Levels would discuss it with Dr. Randie Heinz for a second opinion. Endovascular intervention on the right TP trunk could be considered. Objective Constitutional Pulse regular. Respirations normal and unlabored. Afebrile. Vitals Time Taken: 3:46 PM, Height: 66 in, Weight: 159 lbs, BMI: 25.7, Temperature: 98 F, Pulse: 73 bpm, Klammer, Zylee J. ([id number]) Respiratory Rate: 16 breaths/min, Blood Pressure: 119/55 mmHg. Eyes Nonicteric. Reactive to light. Ears, Nose, Mouth, and Throat Lips, teeth, and gums WNL.Marland Kitchen Moist mucosa without lesions. Neck supple and nontender. No palpable supraclavicular or cervical adenopathy. Normal sized without goiter. Respiratory WNL. No retractions.. Breath sounds WNL, No rubs, rales, rhonchi, or wheeze.. Cardiovascular Heart rhythm and rate regular, no murmur or gallop.. Pedal Pulses WNL. No clubbing, cyanosis or edema. Chest Breasts symmetical and no nipple discharge.. Breast tissue WNL, no masses, lumps, or tenderness.. Lymphatic No adneopathy. No adenopathy. No adenopathy. Musculoskeletal Adexa without tenderness or enlargement.. Digits and nails w/o clubbing, cyanosis, infection, petechiae, ischemia, or inflammatory conditions.Marland Kitchen Psychiatric Judgement and insight Intact.. No evidence of depression, anxiety, or agitation.. General Notes: the right lateral ankle and the left medial ankle was sharply debrided with a #3 curet and minimal bleeding controlled with pressure. The right medial ankle has a large area of excoriation  and ulceration and this was not sharply debrided. Integumentary (Hair, Skin) No suspicious lesions. No crepitus or fluctuance. No peri-wound warmth or erythema. No masses.. Wound #2 status is Open. Original cause of wound was Gradually Appeared. The wound is located on the Right,Lateral Malleolus. The wound measures 5cm length x 3.2cm width x 0.2cm depth; 12.566cm^2 area and 2.513cm^3 volume. Wound #3 status is Open. Original cause of wound was Gradually Appeared. The wound is located on the Left,Medial Lower Leg. The wound measures 3.2cm length x 1.8cm width x 0.1cm depth; 4.524cm^2 area and 0.452cm^3 volume. Wound #4 status is Open. Original cause of wound was Gradually Appeared. The wound is located on the Right,Medial Lower Leg. The wound measures 10cm length x 5.5cm width x 0.1cm depth; 43.197cm^2 area and 4.32cm^3 volume. 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 She has significant ulcerations both ankle areas and after review I have recommended: 1. Santyl ointment to the right lateral ankle and the left medial ankle to be changed daily. 2. Silver alginate to be applied to the right medial ankle. A light Kerlix dressing to keep the dressing material in place. 3. No compression recommended at this stage  due to  her significant arteriosclerotic disease bilaterally 4. due to the significant drainage and odor I have recommended doxycycline 100 mg twice a day empirically 5. To see Dr. Leanord Hawking in follow-up later this coming week Procedures Wound #2 Wound #2 is a Venous Leg Ulcer located on the Right,Lateral Malleolus . There was a Skin/Subcutaneous Tissue Debridement ([id number]) debridement with total area of 15 sq cm performed by Evlyn Kanner, MD. with the following instrument(s): Curette to remove Viable and Non-Viable tissue/material including Fibrin/Slough and Subcutaneous after achieving pain control using Lidocaine 5% topical ointment. A time out was conducted at 04:10, prior to the start of the procedure. A Minimum amount of bleeding was controlled with Pressure. The procedure was tolerated well with a pain level of 3 throughout and a pain level of 0 following the procedure. Post Debridement Measurements: 5cm length x 3.2cm width x 0.3cm depth; 3.77cm^3 volume. Character of Wound/Ulcer Post Debridement requires further debridement. Severity of Tissue Post Debridement is: Fat layer exposed. Post procedure Diagnosis Wound #2: Same as Pre-Procedure Wound #3 Wound #3 is a Venous Leg Ulcer located on the Left,Medial Lower Leg . There was a Skin/Subcutaneous Tissue Debridement ([id number]) debridement with total area of 6 sq cm performed by Evlyn Kanner, MD. [name] ([id number]) with the following instrument(s): Curette to remove Viable and Non-Viable tissue/material including Fibrin/Slough and Subcutaneous after achieving pain control using Lidocaine 5% topical ointment. A time out was conducted at 04:10, prior to the start of the procedure. A Minimum amount of bleeding was controlled with Pressure. The procedure was tolerated well with a pain level of 3 throughout and a pain level of 0 following the procedure. Post Debridement Measurements: 3.2cm length x 1.8cm width x 0.2cm  depth; 0.905cm^3 volume. Character of Wound/Ulcer Post Debridement requires further debridement. Severity of Tissue Post Debridement is: Fat layer exposed. 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 She has significant ulcerations both ankle areas and after review I have recommended: 1. Santyl ointment to the right lateral ankle and the left medial ankle to be changed daily. 2. Silver alginate to be applied to the right medial ankle. A light Kerlix dressing to keep the dressing material in place. 3. No compression recommended at this stage due to her significant arteriosclerotic disease bilaterally 4. due to the significant drainage and odor I have recommended doxycycline 100 mg twice a day empirically 5. To see Dr. Leanord Hawking in follow-up later this coming week Electronic Signature(s) Signed: 02/25/2017 4:29:03 PM By: Evlyn Kanner MD, FACS Entered By: Evlyn Kanner on 02/25/2017 16:29:03 [name] ([id number]) -------------------------------------------------------------------------------- SuperBill Details [name] Date of Service: 02/25/2017 Medical Record Number: [id number] Patient Account Number: [id number] Date of Birth/Sex: 09-Sep-1931 (83 y.o. 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 L97.221 Non-pressure chronic ulcer of left calf limited to breakdown of skin I70.233 Atherosclerosis  of native arteries of right leg with ulceration of ankle I70.243 Atherosclerosis of native arteries of left leg with ulceration of ankle Facility Procedures CPT4: Description Modifier Quantity Code [id number] 99211 - WOUND CARE VISIT-LEV 1 EST PT 1 CPT4: 54098119 11042 - DEB SUBQ TISSUE 20 SQ CM/< 1 ICD-10 Description Diagnosis I87.333 Chronic venous hypertension (idiopathic) with ulcer and inflammation of bilateral lower extremity L97.313 Non-pressure chronic ulcer of right ankle with necrosis of  muscle I70.233 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 ([id number]) Notes Wounds worsened. Infection present, prescribed ABX. Electronic Signature(s) Unsigned Previous Signature: 02/25/2017 4:29:38 PM Version By: Evlyn Kanner MD, FACS Entered By: Elliot Gurney RN, BSN, Kim on 02/26/2017 10:31:08 Signature(s): Date(s):

## 2017-02-26 NOTE — Telephone Encounter (Signed)
Left voicemail message to call back  

## 2017-03-01 NOTE — Telephone Encounter (Signed)
I already discussed her case with a vascular surgeon (Dr. Randie Heinz). He did not think bypass surgery was possible. Schedule the patient to see me in 1-2 weeks for further discussion.

## 2017-03-02 NOTE — Telephone Encounter (Addendum)
Attempted to contact pt's daughter.  Pt states she is at work and asks I give her the appt information. She then asked me to call back and leave the information on her voice mail. I called back, son Les [place]ton answered (on [place] Reviewed appt date of March 29, 3:15pm He is agreeable wl/plan.

## 2017-03-03 ENCOUNTER — Encounter: Payer: Medicare Other | Admitting: Internal Medicine

## 2017-03-03 DIAGNOSIS — I87333 Chronic venous hypertension (idiopathic) with ulcer and inflammation of bilateral lower extremity: Secondary | ICD-10-CM | POA: Diagnosis not present

## 2017-03-04 NOTE — Progress Notes (Signed)
CALYSSA, ZOBRIST ([id number]) Visit Report for 03/03/2017 Arrival Information Details [name], JR. Date of Service: 03/03/2017 2:45 PM Medical Record Patient Account Number: [id number] Number: Treating RN: Clover Mealy, RN, BSN, Rita 1931-11-11 [phone] y.o. Other Clinician: Date of Birth/Sex: Female) Treating ROBSON, MICHAEL Primary Care Sanjana Folz: Guy Begin Loukas Antonson/Extender: G Referring Ryleigh Buenger: [name] in Treatment: 3 Visit Information History Since Last Visit All ordered tests and consults were completed: No Patient Arrived: Wheel Chair Added or deleted any medications: No Arrival Time: 14:35 Any new allergies or adverse reactions: No Accompanied By: son Had a fall or experienced change in No activities of daily living that may affect Transfer Assistance: None risk of falls: Patient Identification Verified: Yes Signs or symptoms of abuse/neglect since last No Secondary Verification Process Yes visito Completed: Hospitalized since last visit: No Patient Requires Transmission-Based No Has Dressing in Place as Prescribed: Yes Precautions: Pain Present Now: Yes Patient Has Alerts: No Electronic Signature(s) Signed: 03/03/2017 4:11:25 PM By: Elpidio Eric BSN, RN Entered By: Elpidio Eric on 03/03/2017 14:37:55 Blahnik, [name] ([id number]) -------------------------------------------------------------------------------- Encounter Discharge Information Details [name] Date of Service: 03/03/2017 2:45 PM Medical Record Patient Account Number: [id number] Number: Treating RN: Clover Mealy, RN, BSN, Rita 1931-04-13 [phone] y.o. Other Clinician: Date of Birth/Sex: Female) Treating ROBSON, MICHAEL Primary Care Caston Coopersmith: Guy Begin Chrisandra Wiemers/Extender: G Referring Charne Mcbrien: [name] in Treatment: 3 Encounter Discharge Information Items Discharge Pain Level: 0 Discharge Condition: Stable Ambulatory Status:  Wheelchair Discharge Destination: Home Transportation: Private Auto Accompanied ByShari Heritage Schedule Follow-up Appointment: No Medication Reconciliation completed and provided to Patient/Care No Joshuan Bolander: Provided on Clinical Summary of Care: 03/03/2017 Form Type Recipient Paper Patient CF Electronic Signature(s) Signed: 03/03/2017 4:11:25 PM By: Elpidio Eric BSN, RN Previous Signature: 03/03/2017 3:16:15 PM Version By: Gwenlyn Perking Entered By: Elpidio Eric on 03/03/2017 15:18:49 Kerwin, [name] ([id number]) -------------------------------------------------------------------------------- Lower Extremity Assessment Details [name] Date of Service: 03/03/2017 2:45 PM Medical Record Patient Account Number: [id number] Number: Treating RN: Clover Mealy, RN, BSN, Rita 1931-03-03 [phone] y.o. Other Clinician: Date of Birth/Sex: Female) Treating ROBSON, MICHAEL Primary Care Akeylah Hendel: Guy Begin Iam Lipson/Extender: G Referring Brynnly Bonet: Guy Begin Weeks in Treatment: 3 Edema Assessment Assessed: [Left: No] [Right: No] Edema: [Left: No] [Right: No] Vascular Assessment Claudication: Claudication Assessment [Left:None] [Right:None] Pulses: Dorsalis Pedis Palpable: [Left:Yes] [Right:Yes] Posterior Tibial Extremity colors, hair growth, and conditions: Extremity Color: [Left:Hyperpigmented] [Right:Hyperpigmented] Hair Growth on Extremity: [Left:No] [Right:No] Temperature of Extremity: [Left:Warm] [Right:Warm] Capillary Refill: [Left:< 3 seconds] [Right:< 3 seconds] Toe Nail Assessment Left: Right: Thick: Yes Yes Discolored: Yes Yes Deformed: Yes Yes Improper Length and Hygiene: Yes Yes Electronic Signature(s) Signed: 03/03/2017 4:11:25 PM By: Elpidio Eric BSN, RN Entered By: Elpidio Eric on 03/03/2017 14:50:33 Crespin, [name] ([id number]) -------------------------------------------------------------------------------- Multi Wound Chart Details Patient Name:  [name]. Date of Service: 03/03/2017 2:45 PM Medical Record Patient Account Number: [id number] Number: Treating RN: Clover Mealy, RN, BSN, Rita [birth year] [phone] y.o. Other Clinician: Date of Birth/Sex: Female) Treating ROBSON, MICHAEL Primary Care Shalon Councilman: Guy Begin Addley Ballinger/Extender: G Referring Eran Windish: Guy Begin Weeks in Treatment: 3 Vital Signs Height(in): 66 Pulse(bpm): 73 Weight(lbs): 159 Blood Pressure 118/65 (mmHg): Body Mass Index(BMI): 26 Temperature(F): 98.1 Respiratory Rate 16 (breaths/min): Photos: [2:No Photos] [3:No Photos] [4:No Photos] Wound Location: [2:Right Malleolus - Lateral] [3:Left Lower Leg - Medial] [4:Right Lower Leg - Medial] Wounding Event: [2:Gradually Appeared] [3:Gradually Appeared] [4:Gradually Appeared] Primary Etiology: [2:Venous Leg Ulcer] [3:Venous Leg Ulcer] [4:Venous Leg  Ulcer] Comorbid History: [2:Cataracts, Arrhythmia, Hypertension, Osteoarthritis] [3:Cataracts, Arrhythmia, Hypertension, Osteoarthritis] [4:Cataracts, Arrhythmia, Hypertension, Osteoarthritis] Date Acquired: [2:01/19/2017] [3:01/19/2017] [4:02/16/2017] Weeks of Treatment: [2:3] [3:3] [4:2] Wound Status: [2:Open] [3:Open] [4:Open] Measurements L x W x D 4x2.2x0.2 [3:1.7x1.5x0.1] [4:10x6x0.1] (cm) Area (cm) : [2:6.912] [3:2.003] [4:47.124] Volume (cm) : [2:1.382] [3:0.2] [4:4.712] % Reduction in Area: [2:-67.00%] [3:-409.70%] [4:-29915.30%] % Reduction in Volume: -233.80% [3:-412.80%] [4:-29350.00%] Classification: [2:Partial Thickness] [3:Partial Thickness] [4:Partial Thickness] Exudate Amount: [2:Medium] [3:Medium] [4:Large] Exudate Type: [2:Serosanguineous] [3:Serosanguineous] [4:Serosanguineous] Exudate Color: [2:red, brown] [3:red, brown] [4:red, brown] Wound Margin: [2:Distinct, outline attached] [3:Distinct, outline attached] [4:Distinct, outline attached] Granulation Amount: [2:Small (1-33%)] [3:Small (1-33%)] [4:Small (1-33%)] Granulation  Quality: [2:Pink, Pale] [3:Pink, Pale] [4:Pink, Pale] Necrotic Amount: [2:Large (67-100%)] [3:Large (67-100%)] [4:Large (67-100%)] Exposed Structures: [2:Fat Layer (Subcutaneous Tissue) Exposed: Yes Fascia: No Tendon: No] [3:Fat Layer (Subcutaneous Tissue) Exposed: Yes Fascia: No Tendon: No] [4:Fat Layer (Subcutaneous Tissue) Exposed: Yes Fascia: No Tendon: No] Muscle: No Muscle: No Muscle: No Joint: No Joint: No Joint: No Bone: No Bone: No Bone: No Epithelialization: None None None Debridement: Debridement (40981- Debridement (19147- Debridement ([id number] 11047) 11047) 11047) Pre-procedure 14:54 14:54 14:54 Verification/Time Out Taken: Pain Control: Lidocaine 4% Topical Lidocaine 4% Topical Lidocaine 4% Topical Solution Solution Solution Tissue Debrided: Fibrin/Slough, Fibrin/Slough, Fibrin/Slough, Subcutaneous Subcutaneous Subcutaneous Level: Skin/Subcutaneous Skin/Subcutaneous Skin/Subcutaneous Tissue Tissue Tissue Debridement Area (sq 8.8 2.55 60 cm): Instrument: Curette Curette Curette Bleeding: Minimum Minimum Minimum Hemostasis Achieved: Pressure Pressure Pressure Procedural Pain: 3 3 3  Post Procedural Pain: 3 3 3  Debridement Treatment Procedure was tolerated Procedure was tolerated Procedure was tolerated Response: well well well Post Debridement 4x2.2x0.2 1.7x1.5x0.2 10x6x0.1 Measurements L x W x D (cm) Post Debridement 1.382 0.401 4.712 Volume: (cm) Periwound Skin Texture: Excoriation: No Excoriation: No Excoriation: No Induration: No Induration: No Induration: No Callus: No Callus: No Callus: No Crepitus: No Crepitus: No Crepitus: No Rash: No Rash: No Rash: No Scarring: No Scarring: No Scarring: No Periwound Skin Maceration: No Maceration: No Maceration: No Moisture: Dry/Scaly: No Dry/Scaly: No Dry/Scaly: No Periwound Skin Color: Hemosiderin Staining: Yes Hemosiderin Staining: Yes Hemosiderin Staining: Yes Mottled: Yes Mottled: Yes Mottled:  Yes Atrophie Blanche: No Atrophie Blanche: No Atrophie Blanche: No Cyanosis: No Cyanosis: No Cyanosis: No Ecchymosis: No Ecchymosis: No Ecchymosis: No Erythema: No Erythema: No Erythema: No Pallor: No Pallor: No Pallor: No Rubor: No Rubor: No Rubor: No Temperature: No Abnormality No Abnormality No Abnormality Tenderness on Yes Yes Yes Palpation: Wound Preparation: Ulcer Cleansing: Other: Ulcer Cleansing: Other: Ulcer Cleansing: Other: surg scrub and water surg scrub and water surg scrub and water Dempsey, Mckaylee J. ([id number]) Topical Anesthetic Topical Anesthetic Topical Anesthetic Applied: Other: lidocaine Applied: Other: lidocaine 4 Applied: Other: lidocaine 4% % 4% Procedures Performed: Debridement Debridement Debridement Treatment Notes Wound #2 (Right, Lateral Malleolus) 1. Cleansed with: Cleanse wound with antibacterial soap and water 4. Dressing Applied: Santyl Ointment 5. Secondary Dressing Applied ABD and Kerlix/Conform 7. Secured with Tape Wound #3 (Left, Medial Lower Leg) 1. Cleansed with: Cleanse wound with antibacterial soap and water 4. Dressing Applied: Santyl Ointment 5. Secondary Dressing Applied ABD and Kerlix/Conform 7. Secured with Tape Wound #4 (Right, Medial Lower Leg) 1. Cleansed with: Cleanse wound with antibacterial soap and water 4. Dressing Applied: Santyl Ointment 5. Secondary Dressing Applied ABD and Kerlix/Conform 7. Secured with Secretary/administrator) Signed: 03/03/2017 4:37:19 PM By: Baltazar Najjar MD Previous Signature: 03/03/2017 4:11:25 PM Version By: Elpidio Eric BSN, RN Entered By: Baltazar Najjar on 03/03/2017 16:22:20 [name] ([id number]) -------------------------------------------------------------------------------- Multi-Disciplinary  Care Plan Details [name], [name]. Date of Service: 03/03/2017 2:45 PM Medical Record Patient Account Number: [id number] Number: Treating RN:  Clover Mealy, RN, BSN, Rita April 05, 1931 [phone] y.o. Other Clinician: Date of Birth/Sex: Female) Treating ROBSON, MICHAEL Primary Care Devanta Daniel: Guy Begin Cherice Glennie/Extender: G Referring Mekenzie Modeste: [name] in Treatment: 3 Active Inactive ` Abuse / Safety / Falls / Self Care Management Nursing Diagnoses: Potential for falls Goals: Patient will remain injury free Date Initiated: 02/09/2017 Target Resolution Date: 04/17/2017 Goal Status: Active Interventions: Assess fall risk on admission and as needed Assess self care needs on admission and as needed Notes: ` Nutrition Nursing Diagnoses: Imbalanced nutrition Goals: Patient/caregiver agrees to and verbalizes understanding of need to use nutritional supplements and/or vitamins as prescribed Date Initiated: 02/09/2017 Target Resolution Date: 04/17/2017 Goal Status: Active Interventions: Assess patient nutrition upon admission and as needed per policy Notes: ` Orientation to the Wound Care Program [name], [name] ([id number]) Nursing Diagnoses: Knowledge deficit related to the wound healing center program Goals: Patient/caregiver will verbalize understanding of the Wound Healing Center Program Date Initiated: 02/09/2017 Target Resolution Date: 02/20/2017 Goal Status: Active Interventions: Provide education on orientation to the wound center Notes: ` Pain, Acute or Chronic Nursing Diagnoses: Pain, acute or chronic: actual or potential Potential alteration in comfort, pain Goals: Patient/caregiver will verbalize adequate pain control between visits Date Initiated: 02/09/2017 Target Resolution Date: 04/17/2017 Goal Status: Active Interventions: Assess comfort goal upon admission Complete pain assessment as per visit requirements Notes: ` Wound/Skin Impairment Nursing Diagnoses: Impaired tissue integrity Knowledge deficit related to smoking impact on wound healing Knowledge deficit related to ulceration/compromised  skin integrity Goals: Ulcer/skin breakdown will have a volume reduction of 80% by week 12 Date Initiated: 02/09/2017 Target Resolution Date: 04/10/2017 Goal Status: Active Interventions: Assess patient/caregiver ability to perform ulcer/skin care regimen upon admission and as needed [name], [name] ([id number]) Assess ulceration(s) every visit Notes: Electronic Signature(s) Signed: 03/03/2017 4:11:25 PM By: Elpidio Eric BSN, RN Entered By: Elpidio Eric on 03/03/2017 14:50:38 Judon, [name] ([id number]) -------------------------------------------------------------------------------- Pain Assessment Details [name] Date of Service: 03/03/2017 2:45 PM Medical Record Patient Account Number: [id number] Number: Treating RN: Clover Mealy, RN, BSN, Rita [birth year] [phone] y.o. Other Clinician: Date of Birth/Sex: Female) Treating ROBSON, MICHAEL Primary Care Tiamarie Furnari: Guy Begin Ashly Goethe/Extender: G Referring Tidus Upchurch: [name] in Treatment: 3 Active Problems Location of Pain Severity and Description of Pain Patient Has Paino Yes Site Locations Pain Location: Pain in Ulcers Rate the pain. Current Pain Level: 5 Character of Pain Describe the Pain: Aching, Tender Pain Management and Medication Current Pain Management: Medication: Yes Rest: Yes How does your wound impact your activities of daily livingo Sleep: Yes Emotions: Yes Appetite: Yes Hobbies: Yes Electronic Signature(s) Signed: 03/03/2017 4:11:25 PM By: Elpidio Eric BSN, RN Entered By: Elpidio Eric on 03/03/2017 14:38:20 Kaner, [name] ([id number]) -------------------------------------------------------------------------------- Patient/Caregiver Education Details [name] Date of Service: 03/03/2017 2:45 PM Medical Record Patient Account Number: [id number] Number: Treating RN: Clover Mealy, RN, BSN, Rita 1931-09-05 [phone] y.o. Other Clinician: Date of  Birth/Gender: Female) Treating ROBSON, MICHAEL Primary Care Physician: Guy Begin Physician/Extender: G Referring Physician: [name] in Treatment: 3 Education Assessment Education Provided To: Patient Education Topics Provided Welcome To The Wound Care Center: Methods: Explain/Verbal Wound Debridement: Methods: Explain/Verbal Wound/Skin Impairment: Methods: Explain/Verbal Electronic Signature(s) Signed: 03/03/2017 4:11:25 PM By: Elpidio Eric BSN, RN Entered By: Elpidio Eric on 03/03/2017 15:19:09 Galasso, [name] ([id number]) -------------------------------------------------------------------------------- Wound  Assessment Details Patient Name: Jordan BandaFULLER, Nathali J. Date of Service: 03/03/2017 2:45 PM Medical Record Patient Account Number: [id number] Number: Treating RN: Clover MealyAfful, RN, BSN, Rita 04-29-1931 [phone](83 y.o. Other Clinician: Date of Birth/Sex: Female) Treating ROBSON, MICHAEL Primary Care Nimrit Kehres: [name], NARGES Darden Flemister/Extender: G Referring Kamaury Cutbirth: [name], NARGES Weeks in Treatment: 3 Wound Status Wound Number: 2 Primary Venous Leg Ulcer Etiology: Wound Location: Right Malleolus - Lateral Wound Status: Open Wounding Event: Gradually Appeared Comorbid Cataracts, Arrhythmia, Hypertension, Date Acquired: 01/19/2017 History: Osteoarthritis Weeks Of Treatment: 3 Clustered Wound: No Photos Photo Uploaded By: Elpidio EricAfful, Rita on 03/03/2017 16:36:38 Wound Measurements Length: (cm) 4 Width: (cm) 2.2 Depth: (cm) 0.2 Area: (cm) 6.912 Volume: (cm) 1.382 % Reduction in Area: -67% % Reduction in Volume: -233.8% Epithelialization: None Tunneling: No Wound Description Classification: Partial Thickness Foul Odor Aft Wound Margin: Distinct, outline attached Slough/Fibrin Exudate Amount: Medium Exudate Type: Serosanguineous Exudate Color: red, brown er Cleansing: No o Yes Wound Bed Granulation Amount: Small (1-33%) Exposed Structure Granulation  Quality: Pink, Pale Fascia Exposed: No Necrotic Amount: Large (67-100%) Fat Layer (Subcutaneous Tissue) Exposed: Yes Stanaland, Malijah J. ([id number]) Necrotic Quality: Adherent Slough Tendon Exposed: No Muscle Exposed: No Joint Exposed: No Bone Exposed: No Periwound Skin Texture Texture Color No Abnormalities Noted: No No Abnormalities Noted: No Callus: No Atrophie Blanche: No Crepitus: No Cyanosis: No Excoriation: No Ecchymosis: No Induration: No Erythema: No Rash: No Hemosiderin Staining: Yes Scarring: No Mottled: Yes Pallor: No Moisture Rubor: No No Abnormalities Noted: No Dry / Scaly: No Temperature / Pain Maceration: No Temperature: No Abnormality Tenderness on Palpation: Yes Wound Preparation Ulcer Cleansing: Other: surg scrub and water, Topical Anesthetic Applied: Other: lidocaine 4%, Treatment Notes Wound #2 (Right, Lateral Malleolus) 1. Cleansed with: Cleanse wound with antibacterial soap and water 4. Dressing Applied: Santyl Ointment 5. Secondary Dressing Applied ABD and Kerlix/Conform 7. Secured with Secretary/administratorTape Electronic Signature(s) Signed: 03/03/2017 4:11:25 PM By: Elpidio EricAfful, Rita BSN, RN Entered By: Elpidio EricAfful, Rita on 03/03/2017 14:47:48 Kawa, [name] ([id number]) -------------------------------------------------------------------------------- Wound Assessment Details Patient Name: Jordan BandaFULLER, Keyerra J. Date of Service: 03/03/2017 2:45 PM Medical Record Patient Account Number: [id number] Number: Treating RN: Clover MealyAfful, RN, BSN, Rita 04-29-1931 [phone](83 y.o. Other Clinician: Date of Birth/Sex: Female) Treating ROBSON, MICHAEL Primary Care Aprel Egelhoff: [name], NARGES Metztli Sachdev/Extender: G Referring Mertha Clyatt: [name], NARGES Weeks in Treatment: 3 Wound Status Wound Number: 3 Primary Venous Leg Ulcer Etiology: Wound Location: Left Lower Leg - Medial Wound Status: Open Wounding Event: Gradually Appeared Comorbid Cataracts, Arrhythmia, Hypertension, Date  Acquired: 01/19/2017 History: Osteoarthritis Weeks Of Treatment: 3 Clustered Wound: No Photos Photo Uploaded By: Elpidio EricAfful, Rita on 03/03/2017 16:37:08 Wound Measurements Length: (cm) 1.7 Width: (cm) 1.5 Depth: (cm) 0.1 Area: (cm) 2.003 Volume: (cm) 0.2 % Reduction in Area: -409.7% % Reduction in Volume: -412.8% Epithelialization: None Tunneling: No Undermining: No Wound Description Classification: Partial Thickness Foul Odor Aft Wound Margin: Distinct, outline attached Slough/Fibrin Exudate Amount: Medium Exudate Type: Serosanguineous Exudate Color: red, brown er Cleansing: No o Yes Wound Bed Granulation Amount: Small (1-33%) Exposed Structure Granulation Quality: Pink, Pale Fascia Exposed: No Necrotic Amount: Large (67-100%) Fat Layer (Subcutaneous Tissue) Exposed: Yes Reuter, Jrue J. ([id number]) Necrotic Quality: Adherent Slough Tendon Exposed: No Muscle Exposed: No Joint Exposed: No Bone Exposed: No Periwound Skin Texture Texture Color No Abnormalities Noted: No No Abnormalities Noted: No Callus: No Atrophie Blanche: No Crepitus: No Cyanosis: No Excoriation: No Ecchymosis: No Induration: No Erythema: No Rash: No Hemosiderin Staining: Yes Scarring: No Mottled: Yes Pallor: No Moisture Rubor: No No  Abnormalities Noted: No Dry / Scaly: No Temperature / Pain Maceration: No Temperature: No Abnormality Tenderness on Palpation: Yes Wound Preparation Ulcer Cleansing: Other: surg scrub and water, Topical Anesthetic Applied: Other: lidocaine 4 %, Treatment Notes Wound #3 (Left, Medial Lower Leg) 1. Cleansed with: Cleanse wound with antibacterial soap and water 4. Dressing Applied: Santyl Ointment 5. Secondary Dressing Applied ABD and Kerlix/Conform 7. Secured with Secretary/administrator) Signed: 03/03/2017 4:11:25 PM By: Elpidio Eric BSN, RN Entered By: Elpidio Eric on 03/03/2017 14:49:06 Troup, [name]  ([id number]) -------------------------------------------------------------------------------- Wound Assessment Details [name] Date of Service: 03/03/2017 2:45 PM Medical Record Patient Account Number: [id number] Number: Treating RN: Clover Mealy, RN, BSN, Rita 1931-03-13 [phone] y.o. Other Clinician: Date of Birth/Sex: Female) Treating ROBSON, MICHAEL Primary Care Burwell Bethel: Guy Begin Hao Dion/Extender: G Referring Jaimarie Rapozo: Guy Begin Weeks in Treatment: 3 Wound Status Wound Number: 4 Primary Venous Leg Ulcer Etiology: Wound Location: Right Lower Leg - Medial Wound Status: Open Wounding Event: Gradually Appeared Comorbid Cataracts, Arrhythmia, Hypertension, Date Acquired: 02/16/2017 History: Osteoarthritis Weeks Of Treatment: 2 Clustered Wound: No Photos Photo Uploaded By: Elpidio Eric on 03/03/2017 16:37:08 Wound Measurements Length: (cm) 10 Width: (cm) 6 Depth: (cm) 0.1 Area: (cm) 47.124 Volume: (cm) 4.712 % Reduction in Area: -29915.3% % Reduction in Volume: -29350% Epithelialization: None Tunneling: No Undermining: No Wound Description Classification: Partial Thickness Wound Margin: Distinct, outline attached Exudate Amount: Large Alcoser, Terina J. ([id number]) Foul Odor After Cleansing: No Slough/Fibrino Yes Exudate Type: Serosanguineous Exudate Color: red, brown Wound Bed Granulation Amount: Small (1-33%) Exposed Structure Granulation Quality: Pink, Pale Fascia Exposed: No Necrotic Amount: Large (67-100%) Fat Layer (Subcutaneous Tissue) Exposed: Yes Necrotic Quality: Adherent Slough Tendon Exposed: No Muscle Exposed: No Joint Exposed: No Bone Exposed: No Periwound Skin Texture Texture Color No Abnormalities Noted: No No Abnormalities Noted: No Callus: No Atrophie Blanche: No Crepitus: No Cyanosis: No Excoriation: No Ecchymosis: No Induration: No Erythema: No Rash: No Hemosiderin Staining: Yes Scarring:  No Mottled: Yes Pallor: No Moisture Rubor: No No Abnormalities Noted: No Dry / Scaly: No Temperature / Pain Maceration: No Temperature: No Abnormality Tenderness on Palpation: Yes Wound Preparation Ulcer Cleansing: Other: surg scrub and water, Topical Anesthetic Applied: Other: lidocaine 4%, Treatment Notes Wound #4 (Right, Medial Lower Leg) 1. Cleansed with: Cleanse wound with antibacterial soap and water 4. Dressing Applied: Santyl Ointment 5. Secondary Dressing Applied ABD and Kerlix/Conform 7. Secured with Secretary/administrator) Signed: 03/03/2017 4:11:25 PM By: Elpidio Eric BSN, RN Entered By: Elpidio Eric on 03/03/2017 14:50:01 KORI, GOINS ([id number]) FRANCELY, CRAW ([id number]) -------------------------------------------------------------------------------- Vitals Details [name] Date of Service: 03/03/2017 2:45 PM Medical Record Patient Account Number: [id number] Number: Treating RN: Clover Mealy, RN, BSN, Rita [birth year] [phone] y.o. Other Clinician: Date of Birth/Sex: Female) Treating ROBSON, MICHAEL Primary Care Rei Contee: Guy Begin Rip Hawes/Extender: G Referring Verner Mccrone: Guy Begin Weeks in Treatment: 3 Vital Signs Time Taken: 14:38 Temperature (F): 98.1 Height (in): 66 Pulse (bpm): 73 Weight (lbs): 159 Respiratory Rate (breaths/min): 16 Body Mass Index (BMI): 25.7 Blood Pressure (mmHg): 118/65 Reference Range: 80 - 120 mg / dl Electronic Signature(s) Signed: 03/03/2017 4:11:25 PM By: Elpidio Eric BSN, RN Entered By: Elpidio Eric on 03/03/2017 14:39:19

## 2017-03-05 NOTE — Progress Notes (Signed)
Jordan, Hopkins ([id number]) Visit Report for 03/03/2017 Chief Complaint Document Details [name], [name]. Date of Service: 03/03/2017 2:45 PM Medical Record Patient Account Number: [id number] Number: Treating RN: Clover Mealy, RN, BSN, Rita June 06, 1931 [phone] y.o. Other Clinician: Date of Birth/Sex: Female) Treating [name] Primary Care Provider: Guy Begin Provider/Extender: Hopkins Referring Provider: Guy Begin Weeks in Treatment: 3 Information Obtained from: Patient Chief Complaint 06/10/16; the patient is here for review of a wound on the right anterior medial lower leg above the ankle Electronic Signature(s) Signed: 03/03/2017 4:37:19 PM By: Baltazar Najjar MD Entered By: Baltazar Najjar on 03/03/2017 16:22:55 Austin, [name] ([id number]) -------------------------------------------------------------------------------- Debridement Details [name] Date of Service: 03/03/2017 2:45 PM Medical Record Patient Account Number: [id number] Number: Treating RN: Clover Mealy, RN, BSN, Rita 1931-04-30 [phone] y.o. Other Clinician: Date of Birth/Sex: Female) Treating [name] Primary Care Provider: Guy Begin Provider/Extender: Hopkins Referring Provider: [name] in Treatment: 3 Debridement Performed for Wound #2 Right,Lateral Malleolus Assessment: Performed By: Physician Maxwell Caul, MD Debridement: Debridement Pre-procedure Yes - 14:54 Verification/Time Out Taken: Start Time: 14:54 Pain Control: Lidocaine 4% Topical Solution Level: Skin/Subcutaneous Tissue Total Area Debrided (L x 4 (cm) x 2.2 (cm) = 8.8 (cm) W): Tissue and other Non-Viable, Fibrin/Slough, Subcutaneous material debrided: Instrument: Curette Bleeding: Minimum Hemostasis Achieved: Pressure End Time: 14:59 Procedural Pain: 3 Post Procedural Pain: 3 Response to Treatment: Procedure was tolerated well Post Debridement Measurements of Total  Wound Length: (cm) 4 Width: (cm) 2.2 Depth: (cm) 0.2 Volume: (cm) 1.382 Character of Wound/Ulcer Post Requires Further Debridement Debridement: Severity of Tissue Post Debridement: Fat layer exposed Post Procedure Diagnosis Same as Pre-procedure Electronic Signature(s) Signed: 03/03/2017 4:37:19 PM By: Baltazar Najjar MD Signed: 03/04/2017 4:49:43 PM By: Elpidio Eric BSN, RN [name], [name] ([id number]) Previous Signature: 03/03/2017 4:11:25 PM Version By: Elpidio Eric BSN, RN Entered By: Baltazar Najjar on 03/03/2017 16:22:31 Wideman, [name] ([id number]) -------------------------------------------------------------------------------- Debridement Details [name] Date of Service: 03/03/2017 2:45 PM Medical Record Patient Account Number: [id number] Number: Treating RN: Clover Mealy, RN, BSN, Rita 09/21/1931 [phone] y.o. Other Clinician: Date of Birth/Sex: Female) Treating [name] Primary Care Provider: Guy Begin Provider/Extender: Hopkins Referring Provider: [name] in Treatment: 3 Debridement Performed for Wound #3 Left,Medial Lower Leg Assessment: Performed By: Physician Maxwell Caul, MD Debridement: Debridement Pre-procedure Yes - 14:54 Verification/Time Out Taken: Start Time: 14:54 Pain Control: Lidocaine 4% Topical Solution Level: Skin/Subcutaneous Tissue Total Area Debrided (L x 1.7 (cm) x 1.5 (cm) = 2.55 (cm) W): Tissue and other Non-Viable, Fibrin/Slough, Subcutaneous material debrided: Instrument: Curette Bleeding: Minimum Hemostasis Achieved: Pressure End Time: 14:59 Procedural Pain: 3 Post Procedural Pain: 3 Response to Treatment: Procedure was tolerated well Post Debridement Measurements of Total Wound Length: (cm) 1.7 Width: (cm) 1.5 Depth: (cm) 0.2 Volume: (cm) 0.401 Character of Wound/Ulcer Post Requires Further Debridement Debridement: Severity of Tissue Post Debridement: Fat layer exposed Post  Procedure Diagnosis Same as Pre-procedure Electronic Signature(s) Signed: 03/03/2017 4:37:19 PM By: Baltazar Najjar MD Signed: 03/04/2017 4:49:43 PM By: Elpidio Eric BSN, RN [name], [name] ([id number]) Previous Signature: 03/03/2017 4:11:25 PM Version By: Elpidio Eric BSN, RN Entered By: Baltazar Najjar on 03/03/2017 16:22:39 [name], [name] -------------------------------------------------------------------------------- Debridement Details [name] Date of Service: 03/03/2017 2:45 PM Medical Record Patient Account Number: [id number] Number: Treating RN: Clover Mealy, RN, BSN, Rita [birth year] [phone] y.o. Other Clinician: Date of Birth/Sex: Female) Treating [name] Primary Care  Provider: [name], [name] Provider/Extender: Hopkins Referring Provider: Gabriel RungFARAHI, [name] Weeks in Treatment: 3 Debridement Performed for Wound #4 Right,Medial Lower Leg Assessment: Performed By: Physician Maxwell CaulOBSON, Jordan Tamplin G, MD Debridement: Debridement Pre-procedure Yes - 14:54 Verification/Time Out Taken: Start Time: 14:54 Pain Control: Lidocaine 4% Topical Solution Level: Skin/Subcutaneous Tissue Total Area Debrided (L x 10 (cm) x 6 (cm) = 60 (cm) W): Tissue and other Non-Viable, Fibrin/Slough, Subcutaneous material debrided: Instrument: Curette Bleeding: Minimum Hemostasis Achieved: Pressure End Time: 14:59 Procedural Pain: 3 Post Procedural Pain: 3 Response to Treatment: Procedure was tolerated well Post Debridement Measurements of Total Wound Length: (cm) 10 Width: (cm) 6 Depth: (cm) 0.1 Volume: (cm) 4.712 Character of Wound/Ulcer Post Requires Further Debridement Debridement: Severity of Tissue Post Debridement: Fat layer exposed Post Procedure Diagnosis Same as Pre-procedure Electronic Signature(s) Signed: 03/03/2017 4:37:19 PM By: Baltazar Najjarobson, Luian Schumpert MD Signed: 03/04/2017 4:49:43 PM By: Elpidio EricAfful, Rita BSN, RN [name], [name]. ([id number]) Previous  Signature: 03/03/2017 4:11:25 PM Version By: Elpidio EricAfful, Rita BSN, RN Entered By: Baltazar Najjarobson, Adarrius Graeff on 03/03/2017 16:22:49 Harker, Jordan RhymeECIL Hopkins. ([id number]) -------------------------------------------------------------------------------- HPI Details [name], [name]. Date of Service: 03/03/2017 2:45 PM Medical Record Patient Account Number: [id number] Number: Treating RN: Clover MealyAfful, RN, BSN, Rita 26-May-1931 [phone](83 y.o. Other Clinician: Date of Birth/Sex: Female) Treating Tynetta Bachmann Primary Care Provider: [name], [name] Provider/Extender: Hopkins Referring Provider: [name], [name] Weeks in Treatment: 3 History of Present Illness HPI Description: 06/10/16; this is an 83 year old woman [place]. [name] saw her primary doctor roughly over a week ago and was referred to the ER for a cellulitis in the right medial leg. [name] was given a round of IV antibiotics in the emergency room and discharged on oral Keflex which [name] is taking. [name] is been left with a uncomfortable leg area on the right medial lower leg. Her ABIs in this clinic were 0.8 on the right 0.6 on the left. [name] does not describe claudication. [name] is not a diabetic. Lab work in the ER showed a normal BUN and creatinine on 6/20 at 18 and 0.92 respectively white count was 10.1 hemoglobin at 13.8. [name] did not have any imaging studies. I can see no vascular evaluations in Epic. [name] does not have a prior wound history 06/17/16; the area affected on her right medial ankle is an area I thought was due to tissue damage from cellulitis last week. [name] is going for venous reflux studies this afternoon, these were not ordered in this clinic at least not by me. The patient does not give a prior history of damage to the skin in this area although I wonder if [name] actually might not of noticed it. We have been using's Aquacel Ag under a wrap although we will not be able to wrap her today 06/24/16: pt reports today [name]  hasn't heard from the vascular clinic regarding proceeding with arterial studies. our office will assist with this today. [name] denies s/s of infection. denies problems with her wraps. 07/07/16; since I last saw this woman [name] was admitted to hospital from 7/16 through 7/18 with sepsis syndrome felt to be secondary to her ulcer. [name] was treated with Vanco and Zosyn the hospital discharged on Septra. Blood cultures were negative. Urine culture showed multiple species [name] is now feeling well using Santyl with border foam to her wound. [name] 07/14/16; no major improvement here. [name] has a quarter-sized wound covered with thick adherent slough on the lateral aspect of her right leg  with several surrounding satellite lesions in a similar state. [name] has had 2 recent bouts of cellulitis, one before [name] came to this clinic and one required a recent hospitalization. I have asked for arterial studies I don't believe these have ever been done. My notes suggest from early in July that [name] went for reflux studies although I don't know that I have ever seen these either, [name] did have a DVT rule out but I did not order this, this did not show a DVT but did show a Baker's cyst 07/21/2016 -- the patient had an arterial duplex study done with waveform suggestive of right femoropopliteal disease. There is also small vessel disease bilaterally. Right ABI was in the normal range of 1.0 and a TBI was 0.74. Left ABI is abnormal at 0.69 with a TBI of 0.40. Three-vessel runoff on the right and a two-vessel runoff on the left. [name] has an appointment to see Dr. Kirke Corin later today. 07/28/2016 -- was seen by Dr. Kirke Corin who reviewed her arterial studies with an ABI of 0.94 on the right and 0.69 on the left with normal right toe brachial index. Duplex showed diffuse atherosclerosis without discrete Lehane, Dacy Hopkins. ([id number]) stenosis and there was a three-vessel runoff on the right below the knee. Based on these  studies and the location of the ulcer he thought it was venous commended [name] continue with wound care consults. 08/04/16; I note the patient's reasonably functional arterial supply which is fortunate. The wound bed appears to be smaller. Debrided of surface slough. [name] is been using Iodoflex 08/11/16: Only a very small open area remains. However it has some depth 08/18/16; small open area is smaller. Still open however. 08/25/16; the area has totally closed down and epithelialized. READMISSION 02/09/17; this is an 83 year old woman who is not a diabetic or smoker. [name] was in our clinic in the summer into mid-September with a wound on her right medial calf that. This eventually closed down. I thought this was mostly venous. [name] did have evidence of PAD and [name] was seen by Dr. Kirke Corin. At that point [name] had an ABI of 0.94 on the right 0.69 on the left with a normal right toe brachial index. It was felt that [name] had enough blood flow to close her wound and that happened. [name] was discharged with stockings although I don't get the sense that [name] really use them [name] tells me that roughly a week or 2 [name] developed swelling in her bilateral lower legs and then noted wounds on her right lateral malleolus and left medial calf. The on this [name] is not really sure how this happened. [name] is not having a lot of pain although [name] is not ambulatory that much only in her home. Other than weight loss [name] has not noticed any other issues no specific claudication no chest pain. 02/16/17; the patient continues with 2 wounds on the right lateral malleolus and left medial calf. Both of these are painful. [name] has chronic venous insufficiency but also known PAD and is previously seen Dr. Kirke Corin. We have arranged vascular follow-up with Dr. Kirke Corin to look at the arterial situation 02/25/2017 -- patient was brought in today because [name] missed her appointment last week with Dr. Leanord Hawking and is having a lot of pain and drainage from her  wounds. The interim [name] was seen by Dr. Bascom Levels who did a duplex of the right lower extremity which shows a right ABI of 0.95 and left ABI 0.74. There was also a  abdominal aortogram with a bilateral extremity angiography done for significant peripheral vascular disease. The right lower extremity showed diffuse SFA and popliteal disease with significant stenosis affecting the TP trunk just before the origin of the peritoneal artery. The left lower extremity showed moderate left SFA stenosis with occluded popliteal artery and tibioperoneal vessels with only visible collateral. The opinion was the patient had no endovascular surgical revascularization options of the left lower extremity and Dr. Bascom Levels would discuss it with Dr. Randie Heinz for a second opinion. Endovascular intervention on the right TP trunk could be considered. 03/03/17; the patient arrives back today haven't seen Dr. Meyer Russel. As noted [name] has severe PAD which is not amenable to either bypass surgery or endovascular interventions on the left. Endovascular interventions in the right TP trunk could be considered. [name] has large wounds on the right medial leg and a sizable wound on the right lateral leg. Small wound on the left medial leg. [name] is really in a lot of discomfort. Using Santyl on the right and I believe Prisma on the left. Electronic Signature(s) Signed: 03/03/2017 4:37:19 PM By: Baltazar Najjar MD Entered By: Baltazar Najjar on 03/03/2017 16:24:39 Willers, [name] ([id number]) -------------------------------------------------------------------------------- Physical Exam Details Patient Name: ARDEL, JAGGER. Date of Service: 03/03/2017 2:45 PM Medical Record Patient Account Number: [id number] Number: Treating RN: Clover Mealy, RN, BSN, Rita 07/14/1931 [phone] y.o. Other Clinician: Date of Birth/Sex: Female) Treating Margaret Cockerill Primary Care Provider: Guy Begin Provider/Extender: Hopkins Referring Provider: Guy Begin Weeks in Treatment: 3 Constitutional Sitting or standing Blood Pressure is within target range for patient.. Pulse regular and within target range for patient.Marland Kitchen Respirations regular, non-labored and within target range.. Temperature is normal and within the target range for the patient.. Patient's appearance is neat and clean. Appears in no acute distress. Well nourished and well developed.. Cardiovascular Pedal pulses absent bilaterally.. Notes Wound exam; the right lateral ankle and the right medial ankle were debrided with an open curet copious amounts of necrotic material. Hemostasis with direct pressure. [name] tolerates this marginally. The left medial calf was also debrided with a #3 curet. The right medial wound has some surface epithelization but unfortunately there is going to need to be a lot more debridement on both right leg wounds before we'll be able to see if there is any chance of getting some healing in this area. Electronic Signature(s) Signed: 03/03/2017 4:37:19 PM By: Baltazar Najjar MD Entered By: Baltazar Najjar on 03/03/2017 16:26:06 Oddo, [name] ([id number]) -------------------------------------------------------------------------------- Physician Orders Details [name], [name]. Date of Service: 03/03/2017 2:45 PM Medical Record Patient Account Number: [id number] Number: Treating RN: Clover Mealy, RN, BSN, Rita 1931/03/04 [phone] y.o. Other Clinician: Date of Birth/Sex: Female) Treating Kassandra Meriweather Primary Care Provider: Guy Begin Provider/Extender: Hopkins Referring Provider: [name] in Treatment: 3 Verbal / Phone Orders: No Diagnosis Coding Wound Cleansing Wound #2 Right,Lateral Malleolus o Cleanse wound with mild soap and water Wound #3 Left,Medial Lower Leg o Clean wound with Normal Saline. o Cleanse wound with mild soap and water o Cleanse wound with mild soap and water Wound #4 Right,Medial Lower  Leg o Cleanse wound with mild soap and water Anesthetic Wound #2 Right,Lateral Malleolus o Topical Lidocaine 4% cream applied to wound bed prior to debridement - clinic use only Wound #3 Left,Medial Lower Leg o Topical Lidocaine 4% cream applied to wound bed prior to debridement - clinic use only Wound #4 Right,Medial Lower Leg o Topical Lidocaine 4% cream applied to wound bed  prior to debridement - clinic use only Primary Wound Dressing Wound #2 Right,Lateral Malleolus o Santyl Ointment Wound #3 Left,Medial Lower Leg o Santyl Ointment Wound #4 Right,Medial Lower Leg o Santyl Ointment Secondary Dressing Wound #2 Right,Lateral Malleolus o ABD pad Espinoza, Railee Hopkins. ([id number]) o Gauze and Kerlix/Conform Wound #3 Left,Medial Lower Leg o ABD pad o Gauze and Kerlix/Conform Wound #4 Right,Medial Lower Leg o ABD pad o Gauze and Kerlix/Conform Dressing Change Frequency Wound #2 Right,Lateral Malleolus o Change dressing every day. - Home Health to follow Medicare/Insurance guidelines. May teach available family member wound care. Wound #3 Left,Medial Lower Leg o Change dressing every day. - Home Health to follow Medicare/Insurance guidelines. May teach available family member wound care. Wound #4 Right,Medial Lower Leg o Change dressing every day. - Home Health to follow Medicare/Insurance guidelines. May teach available family member wound care. Follow-up Appointments Wound #2 Right,Lateral Malleolus o Return Appointment in 1 week. Wound #3 Left,Medial Lower Leg o Return Appointment in 1 week. Wound #4 Right,Medial Lower Leg o Return Appointment in 1 week. Additional Orders / Instructions Wound #2 Right,Lateral Malleolus o Increase protein intake. Wound #3 Left,Medial Lower Leg o Increase protein intake. Wound #4 Right,Medial Lower Leg o Increase protein intake. Home Health Wound #2 Right,Lateral Malleolus o Initiate Home  Health for Skilled Nursing o Home Health Nurse may visit PRN to address patientos wound care needs. [name], SHELLER ([id number]) o FACE TO FACE ENCOUNTER: MEDICARE and MEDICAID PATIENTS: I certify that this patient is under my care and that I had a face-to-face encounter that meets the physician face-to-face encounter requirements with this patient on this date. The encounter with the patient was in whole or in part for the following MEDICAL CONDITION: (primary reason for Home Healthcare) MEDICAL NECESSITY: I certify, that based on my findings, NURSING services are a medically necessary home health service. HOME BOUND STATUS: I certify that my clinical findings support that this patient is homebound (i.e., Due to illness or injury, pt requires aid of supportive devices such as crutches, cane, wheelchairs, walkers, the use of special transportation or the assistance of another person to leave their place of residence. There is a normal inability to leave the home and doing so requires considerable and taxing effort. Other absences are for medical reasons / religious services and are infrequent or of short duration when for other reasons). o If current dressing causes regression in wound condition, may D/C ordered dressing product/s and apply Normal Saline Moist Dressing daily until next Wound Healing Center / Other MD appointment. Notify Wound Healing Center of regression in wound condition at [phone]. o Please direct any NON-WOUND related issues/requests for orders to patient's Primary Care Physician Wound #3 Left,Medial Lower Leg o Initiate Home Health for Skilled Nursing o Home Health Nurse may visit PRN to address patientos wound care needs. o FACE TO FACE ENCOUNTER: MEDICARE and MEDICAID PATIENTS: I certify that this patient is under my care and that I had a face-to-face encounter that meets the physician face-to-face encounter requirements with this patient on this  date. The encounter with the patient was in whole or in part for the following MEDICAL CONDITION: (primary reason for Home Healthcare) MEDICAL NECESSITY: I certify, that based on my findings, NURSING services are a medically necessary home health service. HOME BOUND STATUS: I certify that my clinical findings support that this patient is homebound (i.e., Due to illness or injury, pt requires aid of supportive devices such as crutches, cane, wheelchairs, walkers, the use  of special transportation or the assistance of another person to leave their place of residence. There is a normal inability to leave the home and doing so requires considerable and taxing effort. Other absences are for medical reasons / religious services and are infrequent or of short duration when for other reasons). o If current dressing causes regression in wound condition, may D/C ordered dressing product/s and apply Normal Saline Moist Dressing daily until next Wound Healing Center / Other MD appointment. Notify Wound Healing Center of regression in wound condition at [phone]. o Please direct any NON-WOUND related issues/requests for orders to patient's Primary Care Physician Wound #4 Right,Medial Lower Leg o Initiate Home Health for Skilled Nursing o Home Health Nurse may visit PRN to address patientos wound care needs. o FACE TO FACE ENCOUNTER: MEDICARE and MEDICAID PATIENTS: I certify that this patient is under my care and that I had a face-to-face encounter that meets the physician face-to-face encounter requirements with this patient on this date. The encounter with the patient was in whole or in part for the following MEDICAL CONDITION: (primary reason for Home Healthcare) MEDICAL NECESSITY: I certify, that based on my findings, NURSING services are a medically necessary home health service. HOME BOUND STATUS: I certify that my clinical findings support that this patient is homebound (i.e., Due to  illness or injury, pt requires aid of supportive devices such as crutches, cane, wheelchairs, walkers, the use of special Inks, Sahvanna Hopkins. ([id number]) transportation or the assistance of another person to leave their place of residence. There is a normal inability to leave the home and doing so requires considerable and taxing effort. Other absences are for medical reasons / religious services and are infrequent or of short duration when for other reasons). o If current dressing causes regression in wound condition, may D/C ordered dressing product/s and apply Normal Saline Moist Dressing daily until next Wound Healing Center / Other MD appointment. Notify Wound Healing Center of regression in wound condition at [phone]. o Please direct any NON-WOUND related issues/requests for orders to patient's Primary Care Physician Electronic Signature(s) Signed: 03/03/2017 4:11:25 PM By: Elpidio Eric BSN, RN Signed: 03/03/2017 4:37:19 PM By: Baltazar Najjar MD Entered By: Elpidio Eric on 03/03/2017 15:02:41 Gao, [name] ([id number]) -------------------------------------------------------------------------------- Problem List Details Patient Name: NOMI, RUDNICKI. Date of Service: 03/03/2017 2:45 PM Medical Record Patient Account Number: [id number] Number: Treating RN: Clover Mealy, RN, BSN, Rita [birth year] [phone] y.o. 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 L97.221 Non-pressure chronic ulcer of left calf limited to 02/09/2017 Yes breakdown of skin I70.233  Atherosclerosis of native arteries of right leg with 02/25/2017 Yes ulceration of ankle 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 Date of Service: 03/03/2017 2:45 PM Medical Record Patient Account Number: [id number] Number: Treating RN: Clover Mealy, RN, BSN, Rita 04-27-1931 [id number] y.o. Other Clinician: Date of Birth/Sex: Female) Treating Ivyanna Sibert Primary Care Provider: Guy Begin Provider/Extender: Hopkins Referring Provider: [name]  in Treatment: 3 Subjective Chief Complaint Information obtained from Patient 06/10/16; the patient is here for review of a wound on the right anterior medial lower leg above the ankle History of Present Illness (HPI) 06/10/16; this is an 83 year old woman who lives near [place] with family members. [name] saw her primary doctor roughly over a week ago and was referred to the ER for a cellulitis in the right medial leg. [name] was given a round of IV antibiotics in the emergency room and discharged on oral Keflex which [name] is taking. [name] is been left with a uncomfortable leg area on the right medial lower leg. Her ABIs in this clinic were 0.8 on the right 0.6 on the left. [name] does not describe claudication. [name] is not a diabetic. Lab work in the ER showed a normal BUN and creatinine on 6/20 at 18 and 0.92 respectively white count was 10.1 hemoglobin at 13.8. [name] did not have any imaging studies. I can see no vascular evaluations in Epic. [name] does not have a prior wound history 06/17/16; the area affected on her right medial ankle is an area I thought was due to tissue damage  from cellulitis last week. [name] is going for venous reflux studies this afternoon, these were not ordered in this clinic at least not by me. The patient does not give a prior history of damage to the skin in this area although I wonder if [name] actually might not of noticed it. We have been using's Aquacel Ag under a wrap although we will not be able to wrap her today 06/24/16: pt reports today [name] hasn't heard from the vascular clinic regarding proceeding with arterial studies. our office will assist with this today. [name] denies s/s of infection. denies problems with her wraps. 07/07/16; since I last saw this woman [name] was admitted to hospital from 7/16 through 7/18 with sepsis syndrome felt to be secondary to her ulcer. [name] was treated with Vanco and Zosyn the hospital discharged on Septra. Blood cultures were negative. Urine culture showed multiple species [name] is now feeling well using Santyl with border foam to her wound. [name] 07/14/16; no major improvement here. [name] has a quarter-sized wound covered with thick adherent slough on the lateral aspect of her right leg with several surrounding satellite lesions in a similar state. [name] has had 2 recent bouts of cellulitis, one before [name] came to this clinic and one required a recent hospitalization. I have asked for arterial studies I don't believe these have ever been done. My notes suggest from early in July that [name] went for reflux studies although I don't know that I have ever seen these either, [name] did have a DVT rule out but I did not order this, this did not show a DVT but did show a Baker's cyst 07/21/2016 -- the patient had an arterial duplex study done with waveform suggestive of right femoropopliteal disease. There is also small vessel disease bilaterally. Right ABI was in the normal range of 1.0 and a TBI Breunig, Monea Hopkins. ([id number]) was 0.74. Left ABI is abnormal at 0.69 with a TBI of 0.40. Three-vessel runoff on the  right and a two-vessel runoff on the left. [name] has an appointment to see Dr. Kirke Corin later today. 07/28/2016 -- was seen by Dr. Kirke Corin who reviewed her arterial studies with an ABI of 0.94 on the right and 0.69 on the left with normal right toe brachial index. Duplex showed diffuse atherosclerosis without discrete stenosis and  there was a three-vessel runoff on the right below the knee. Based on these studies and the location of the ulcer he thought it was venous commended [name] continue with wound care consults. 08/04/16; I note the patient's reasonably functional arterial supply which is fortunate. The wound bed appears to be smaller. Debrided of surface slough. [name] is been using Iodoflex 08/11/16: Only a very small open area remains. However it has some depth 08/18/16; small open area is smaller. Still open however. 08/25/16; the area has totally closed down and epithelialized. READMISSION 02/09/17; this is an 83 year old woman who is not a diabetic or smoker. [name] was in our clinic in the summer into mid-September with a wound on her right medial calf that. This eventually closed down. I thought this was mostly venous. [name] did have evidence of PAD and [name] was seen by Dr. Kirke Corin. At that point [name] had an ABI of 0.94 on the right 0.69 on the left with a normal right toe brachial index. It was felt that [name] had enough blood flow to close her wound and that happened. [name] was discharged with stockings although I don't get the sense that [name] really use them [name] tells me that roughly a week or 2 [name] developed swelling in her bilateral lower legs and then noted wounds on her right lateral malleolus and left medial calf. The on this [name] is not really sure how this happened. [name] is not having a lot of pain although [name] is not ambulatory that much only in her home. Other than weight loss [name] has not noticed any other issues no specific claudication no chest pain. 02/16/17; the patient continues with 2 wounds on the  right lateral malleolus and left medial calf. Both of these are painful. [name] has chronic venous insufficiency but also known PAD and is previously seen Dr. Kirke Corin. We have arranged vascular follow-up with Dr. Kirke Corin to look at the arterial situation 02/25/2017 -- patient was brought in today because [name] missed her appointment last week with Dr. Leanord Hawking and is having a lot of pain and drainage from her wounds. The interim [name] was seen by Dr. Bascom Levels who did a duplex of the right lower extremity which shows a right ABI of 0.95 and left ABI 0.74. There was also a abdominal aortogram with a bilateral extremity angiography done for significant peripheral vascular disease. The right lower extremity showed diffuse SFA and popliteal disease with significant stenosis affecting the TP trunk just before the origin of the peritoneal artery. The left lower extremity showed moderate left SFA stenosis with occluded popliteal artery and tibioperoneal vessels with only visible collateral. The opinion was the patient had no endovascular surgical revascularization options of the left lower extremity and Dr. Bascom Levels would discuss it with Dr. Randie Heinz for a second opinion. Endovascular intervention on the right TP trunk could be considered. 03/03/17; the patient arrives back today haven't seen Dr. Meyer Russel. As noted [name] has severe PAD which is not amenable to either bypass surgery or endovascular interventions on the left. Endovascular interventions in the right TP trunk could be considered. [name] has large wounds on the right medial leg and a sizable wound on the right lateral leg. Small wound on the left medial leg. [name] is really in a lot of discomfort. Using Santyl on the right and I believe Prisma on the left. [name], [name] ([id number]) Objective Constitutional Sitting or standing Blood Pressure is within target range for patient.. Pulse regular and within target range for patient.Marland Kitchen Respirations  regular, non-labored and  within target range.. Temperature is normal and within the target range for the patient.. Patient's appearance is neat and clean. Appears in no acute distress. Well nourished and well developed.. Vitals Time Taken: 2:38 PM, Height: 66 in, Weight: 159 lbs, BMI: 25.7, Temperature: 98.1 F, Pulse: 73 bpm, Respiratory Rate: 16 breaths/min, Blood Pressure: 118/65 mmHg. Cardiovascular Pedal pulses absent bilaterally.. General Notes: Wound exam; the right lateral ankle and the right medial ankle were debrided with an open curet copious amounts of necrotic material. Hemostasis with direct pressure. [name] tolerates this marginally. The left medial calf was also debrided with a #3 curet. The right medial wound has some surface epithelization but unfortunately there is going to need to be a lot more debridement on both right leg wounds before we'll be able to see if there is any chance of getting some healing in this area. Integumentary (Hair, Skin) Wound #2 status is Open. Original cause of wound was Gradually Appeared. The wound is located on the Right,Lateral Malleolus. The wound measures 4cm length x 2.2cm width x 0.2cm depth; 6.912cm^2 area and 1.382cm^3 volume. There is Fat Layer (Subcutaneous Tissue) Exposed exposed. There is no tunneling noted. There is a medium amount of serosanguineous drainage noted. The wound margin is distinct with the outline attached to the wound base. There is small (1-33%) pink, pale granulation within the wound bed. There is a large (67-100%) amount of necrotic tissue within the wound bed including Adherent Slough. The periwound skin appearance exhibited: Hemosiderin Staining, Mottled. The periwound skin appearance did not exhibit: Callus, Crepitus, Excoriation, Induration, Rash, Scarring, Dry/Scaly, Maceration, Atrophie Blanche, Cyanosis, Ecchymosis, Pallor, Rubor, Erythema. Periwound temperature was noted as No Abnormality. The periwound has tenderness on  palpation. Wound #3 status is Open. Original cause of wound was Gradually Appeared. The wound is located on the Left,Medial Lower Leg. The wound measures 1.7cm length x 1.5cm width x 0.1cm depth; 2.003cm^2 area and 0.2cm^3 volume. There is Fat Layer (Subcutaneous Tissue) Exposed exposed. There is no tunneling or undermining noted. There is a medium amount of serosanguineous drainage noted. The wound margin is distinct with the outline attached to the wound base. There is small (1-33%) pink, pale granulation within the wound bed. There is a large (67-100%) amount of necrotic tissue within the wound bed including Adherent Slough. The periwound skin appearance exhibited: Hemosiderin Staining, Mottled. The periwound skin appearance did not exhibit: Callus, Crepitus, Excoriation, Induration, Rash, Scarring, Dry/Scaly, Maceration, Atrophie Blanche, Cyanosis, Ecchymosis, Pallor, Rubor, Erythema. Periwound temperature was noted as No Abnormality. The periwound has tenderness on palpation. Wound #4 status is Open. Original cause of wound was Gradually Appeared. The wound is located on the Right,Medial Lower Leg. The wound measures 10cm length x 6cm width x 0.1cm depth; 47.124cm^2 area and 4.712cm^3 volume. There is Fat Layer (Subcutaneous Tissue) Exposed exposed. There is no tunneling or undermining noted. There is a large amount of serosanguineous drainage noted. The wound margin is Gazzola, Perline Hopkins. ([id number]) distinct with the outline attached to the wound base. There is small (1-33%) pink, pale granulation within the wound bed. There is a large (67-100%) amount of necrotic tissue within the wound bed including Adherent Slough. The periwound skin appearance exhibited: Hemosiderin Staining, Mottled. The periwound skin appearance did not exhibit: Callus, Crepitus, Excoriation, Induration, Rash, Scarring, Dry/Scaly, Maceration, Atrophie Blanche, Cyanosis, Ecchymosis, Pallor, Rubor, Erythema. Periwound  temperature was noted as No Abnormality. The periwound has tenderness on palpation. 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 There was a Skin/Subcutaneous Tissue Debridement ([id number]) debridement with total area of 8.8 sq cm performed by Maxwell Caul, MD. with the following instrument(s): Curette to remove Non-Viable tissue/material including Fibrin/Slough and Subcutaneous after achieving pain control using Lidocaine 4% Topical Solution. A time out was conducted at 14:54, prior to the start of the procedure. A Minimum amount of bleeding was controlled with Pressure. The procedure was tolerated well with a pain level of 3 throughout and a pain level of 3 following the procedure. Post Debridement Measurements: 4cm length x 2.2cm width x 0.2cm depth; 1.382cm^3 volume. Character of Wound/Ulcer Post Debridement requires further debridement. Severity of Tissue Post Debridement is: Fat layer exposed. Post procedure Diagnosis Wound #2: Same as Pre-Procedure Wound #3 Wound #3 is a Venous Leg Ulcer located on the Left,Medial Lower Leg . There was a Skin/Subcutaneous Tissue Debridement ([id number]) debridement with total area of 2.55 sq cm performed by Maxwell Caul, MD. with the following instrument(s): Curette to remove Non-Viable tissue/material including Fibrin/Slough and Subcutaneous after achieving pain control using Lidocaine 4% Topical Solution. A time out was conducted at  14:54, prior to the start of the procedure. A Minimum amount of bleeding was controlled with Pressure. The procedure was tolerated well with a pain level of 3 throughout and a pain level of 3 following the procedure. Post Debridement Measurements: 1.7cm length x 1.5cm width x 0.2cm depth; [name], [name]. ([id number]) 0.401cm^3 volume. Character of Wound/Ulcer Post Debridement requires further debridement. Severity of Tissue Post Debridement is: Fat layer exposed. Post procedure Diagnosis Wound #3: Same as Pre-Procedure Wound #4 Wound #4 is a Venous Leg Ulcer located on the Right,Medial Lower Leg . There was a Skin/Subcutaneous Tissue Debridement ([id number]) debridement with total area of 60 sq cm performed by Maxwell Caul, MD. with the following instrument(s): Curette to remove Non-Viable tissue/material including Fibrin/Slough and Subcutaneous after achieving pain control using Lidocaine 4% Topical Solution. A time out was conducted at 14:54, prior to the start of the procedure. A Minimum amount of bleeding was controlled with Pressure. The procedure was tolerated well with a pain level of 3 throughout and a pain level of 3 following the procedure. Post Debridement Measurements: 10cm length x 6cm width x 0.1cm depth; 4.712cm^3 volume. Character of Wound/Ulcer Post Debridement requires further debridement. Severity of Tissue Post Debridement is: Fat layer exposed. Post procedure Diagnosis Wound #4: Same as Pre-Procedure Plan Wound Cleansing: Wound #2 Right,Lateral Malleolus: Cleanse wound with mild soap and water Wound #3 Left,Medial Lower Leg: Clean wound with Normal Saline. Cleanse wound with mild soap and water Cleanse wound with mild soap and water Wound #4 Right,Medial Lower Leg: Cleanse wound with mild soap and water Anesthetic: Wound #2 Right,Lateral Malleolus: Topical Lidocaine 4% cream applied to wound bed prior to debridement - clinic use only Wound #3 Left,Medial  Lower Leg: Topical Lidocaine 4% cream applied to wound bed prior to debridement - clinic use only Wound #4 Right,Medial Lower Leg: Topical Lidocaine 4% cream applied to wound bed prior to debridement - clinic use only Primary  Wound Dressing: Wound #2 Right,Lateral Malleolus: Santyl Ointment Wound #3 Left,Medial Lower Leg: Santyl Ointment Wound #4 Right,Medial Lower Leg: Santyl Ointment Secondary Dressing: JILDA, KRESS. ([id number]) Wound #2 Right,Lateral Malleolus: ABD pad Gauze and Kerlix/Conform Wound #3 Left,Medial Lower Leg: ABD pad Gauze and Kerlix/Conform Wound #4 Right,Medial Lower Leg: ABD pad Gauze and Kerlix/Conform Dressing Change Frequency: Wound #2 Right,Lateral Malleolus: Change dressing every day. - Home Health to follow Medicare/Insurance guidelines. May teach available family member wound care. Wound #3 Left,Medial Lower Leg: Change dressing every day. - Home Health to follow Medicare/Insurance guidelines. May teach available family member wound care. Wound #4 Right,Medial Lower Leg: Change dressing every day. - Home Health to follow Medicare/Insurance guidelines. May teach available family member wound care. Follow-up Appointments: Wound #2 Right,Lateral Malleolus: Return Appointment in 1 week. Wound #3 Left,Medial Lower Leg: Return Appointment in 1 week. Wound #4 Right,Medial Lower Leg: Return Appointment in 1 week. Additional Orders / Instructions: Wound #2 Right,Lateral Malleolus: Increase protein intake. Wound #3 Left,Medial Lower Leg: Increase protein intake. Wound #4 Right,Medial Lower Leg: Increase protein intake. Home Health: Wound #2 Right,Lateral Malleolus: Initiate Home Health for Skilled Nursing Home Health Nurse may visit PRN to address patient s wound care needs. FACE TO FACE ENCOUNTER: MEDICARE and MEDICAID PATIENTS: I certify that this patient is under my care and that I had a face-to-face encounter that meets the physician  face-to-face encounter requirements with this patient on this date. The encounter with the patient was in whole or in part for the following MEDICAL CONDITION: (primary reason for Home Healthcare) MEDICAL NECESSITY: I certify, that based on my findings, NURSING services are a medically necessary home health service. HOME BOUND STATUS: I certify that my clinical findings support that this patient is homebound (i.e., Due to illness or injury, pt requires aid of supportive devices such as crutches, cane, wheelchairs, walkers, the use of special transportation or the assistance of another person to leave their place of residence. There is a normal inability to leave the home and doing so requires considerable and taxing effort. Other absences are for medical reasons / religious services and are infrequent or of short duration when for other reasons). If current dressing causes regression in wound condition, may D/C ordered dressing product/s and apply Normal Saline Moist Dressing daily until next Wound Healing Center / Other MD appointment. Notify Wound Healing Center of regression in wound condition at [phone]. Please direct any NON-WOUND related issues/requests for orders to patient's Primary Care Physician ALLAYNA, ERLICH ([id number]) Wound #3 Left,Medial Lower Leg: Initiate Home Health for Skilled Nursing Home Health Nurse may visit PRN to address patient s wound care needs. FACE TO FACE ENCOUNTER: MEDICARE and MEDICAID PATIENTS: I certify that this patient is under my care and that I had a face-to-face encounter that meets the physician face-to-face encounter requirements with this patient on this date. The encounter with the patient was in whole or in part for the following MEDICAL CONDITION: (primary reason for Home Healthcare) MEDICAL NECESSITY: I certify, that based on my findings, NURSING services are a medically necessary home health service. HOME BOUND STATUS: I certify that my  clinical findings support that this patient is homebound (i.e., Due to illness or injury, pt requires aid of supportive devices such as crutches, cane, wheelchairs, walkers, the use of special transportation or the assistance of another person to leave their place of residence. There is a normal inability to leave the home and doing so requires considerable and taxing effort.  Other absences are for medical reasons / religious services and are infrequent or of short duration when for other reasons). If current dressing causes regression in wound condition, may D/C ordered dressing product/s and apply Normal Saline Moist Dressing daily until next Wound Healing Center / Other MD appointment. Notify Wound Healing Center of regression in wound condition at [phone]. Please direct any NON-WOUND related issues/requests for orders to patient's Primary Care Physician Wound #4 Right,Medial Lower Leg: Initiate Home Health for Skilled Nursing Home Health Nurse may visit PRN to address patient s wound care needs. FACE TO FACE ENCOUNTER: MEDICARE and MEDICAID PATIENTS: I certify that this patient is under my care and that I had a face-to-face encounter that meets the physician face-to-face encounter requirements with this patient on this date. The encounter with the patient was in whole or in part for the following MEDICAL CONDITION: (primary reason for Home Healthcare) MEDICAL NECESSITY: I certify, that based on my findings, NURSING services are a medically necessary home health service. HOME BOUND STATUS: I certify that my clinical findings support that this patient is homebound (i.e., Due to illness or injury, pt requires aid of supportive devices such as crutches, cane, wheelchairs, walkers, the use of special transportation or the assistance of another person to leave their place of residence. There is a normal inability to leave the home and doing so requires considerable and taxing effort. Other  absences are for medical reasons / religious services and are infrequent or of short duration when for other reasons). If current dressing causes regression in wound condition, may D/C ordered dressing product/s and apply Normal Saline Moist Dressing daily until next Wound Healing Center / Other MD appointment. Notify Wound Healing Center of regression in wound condition at [phone]. Please direct any NON-WOUND related issues/requests for orders to patient's Primary Care Physician #1 Santyl to all wounds, ABG, Kerlix and light Coban #2 I am not sure when [name] follows with Dr. Kirke Corin to see if [name] is still felt to be a candidate for endovascular intervention in the right TP junction Electronic Signature(s) Signed: 03/03/2017 4:37:19 PM By: Baltazar Najjar MD Entered By: Baltazar Najjar on 03/03/2017 16:27:11 Cart, TAVON CORRIHER ([id number]) SABOURIN, Marielys Shela Commons ([id number]) -------------------------------------------------------------------------------- SuperBill Details [name] Date of Service: 03/03/2017 Medical Record Patient Account Number: [id number] Number: Treating RN: Clover Mealy, RN, BSN, [place] 04/11/1931 [id number] y.o. 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 L97.221 Non-pressure chronic ulcer of left calf limited to breakdown of skin I70.233 Atherosclerosis of native arteries of right leg with ulceration of ankle I70.243 Atherosclerosis of native arteries of left leg with ulceration of ankle Facility Procedures CPT4 Code Description: [id number] 11042 - DEB SUBQ TISSUE 20  SQ CM/< ICD-10 Description Diagnosis L97.313 Non-pressure chronic ulcer of right ankle with necrosi Modifier: s of muscle Quantity: 1 CPT4 Code Description: [id number] 11045 - DEB SUBQ TISS EA ADDL 20CM ICD-10 Description Diagnosis L97.221 Non-pressure chronic ulcer of left calf limited to bre Modifier: akdown of skin Quantity: 3 Physician Procedures CPT4 Code Description: [id number] - WC PHYS SUBQ TISS 20 SQ CM ICD-10 Description Diagnosis 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

## 2017-03-09 ENCOUNTER — Encounter: Payer: Medicare Other | Admitting: Internal Medicine

## 2017-03-09 DIAGNOSIS — I87333 Chronic venous hypertension (idiopathic) with ulcer and inflammation of bilateral lower extremity: Secondary | ICD-10-CM | POA: Diagnosis not present

## 2017-03-10 ENCOUNTER — Other Ambulatory Visit: Payer: Self-pay | Admitting: Internal Medicine

## 2017-03-10 NOTE — Progress Notes (Signed)
[name], [name] ([id number]) Visit Report for 03/09/2017 Chief Complaint Document Details [name], [name]. Date of Service: 03/09/2017 2:45 PM Medical Record Patient Account Number: [id number] Number: Treating RN: Clover Mealy, RN, BSN, Rita August 29, 1931 [phone] y.o. Other Clinician: Date of Birth/Sex: Female) Treating ROBSON, MICHAEL Primary Care Provider: Guy Begin Provider/Extender: G Referring Provider: Guy Begin Weeks in Treatment: 4 Information Obtained from: Patient Chief Complaint 06/10/16; the patient is here for review of a wound on the right anterior medial lower leg above the ankle Electronic Signature(s) Signed: 03/10/2017 7:58:45 AM By: Baltazar Najjar MD Entered By: Baltazar Najjar on 03/09/2017 15:30:13 [name], [name] ([id number]) -------------------------------------------------------------------------------- Debridement Details [name] Date of Service: 03/09/2017 2:45 PM Medical Record Patient Account Number: [id number] Number: Treating RN: Clover Mealy, RN, BSN, Rita 15-Jul-1931 [phone] y.o. Other Clinician: Date of Birth/Sex: Female) Treating ROBSON, MICHAEL Primary Care Provider: Guy Begin Provider/Extender: G Referring Provider: [name] in Treatment: 4 Debridement Performed for Wound #2 Right,Lateral Malleolus Assessment: Performed By: Physician Maxwell Caul, MD Debridement: Debridement Pre-procedure Yes - 15:10 Verification/Time Out Taken: Start Time: 15:10 Pain Control: Lidocaine 4% Topical Solution Level: Skin/Subcutaneous Tissue Total Area Debrided (L x 3.7 (cm) x 2.2 (cm) = 8.14 (cm) W): Tissue and other Non-Viable, Fibrin/Slough, Subcutaneous material debrided: Instrument: Curette Bleeding: Minimum Hemostasis Achieved: Pressure End Time: 15:15 Procedural Pain: 2 Post Procedural Pain: 2 Response to Treatment: Procedure was tolerated well Post Debridement Measurements of  Total Wound Length: (cm) 3.7 Width: (cm) 2.2 Depth: (cm) 0.2 Volume: (cm) 1.279 Character of Wound/Ulcer Post Requires Further Debridement Debridement: Severity of Tissue Post Debridement: Fat layer exposed Post Procedure Diagnosis Same as Pre-procedure Electronic Signature(s) Signed: 03/09/2017 4:40:29 PM By: Elpidio Eric BSN, RN Signed: 03/10/2017 7:58:45 AM By: Baltazar Najjar MD [name], [name] ([id number]) Entered By: Baltazar Najjar on 03/09/2017 15:29:45 [name], [name] ([id number]) -------------------------------------------------------------------------------- Debridement Details [name] Date of Service: 03/09/2017 2:45 PM Medical Record Patient Account Number: [id number] Number: Treating RN: Clover Mealy, RN, BSN, Rita 15-Jan-1931 [phone] y.o. Other Clinician: Date of Birth/Sex: Female) Treating ROBSON, MICHAEL Primary Care Provider: Guy Begin Provider/Extender: G Referring Provider: [name] in Treatment: 4 Debridement Performed for Wound #3 Left,Medial Lower Leg Assessment: Performed By: Physician Maxwell Caul, MD Debridement: Debridement Pre-procedure Yes - 15:10 Verification/Time Out Taken: Start Time: 15:10 Pain Control: Lidocaine 4% Topical Solution Level: Skin/Subcutaneous Tissue Total Area Debrided (L x 1.7 (cm) x 1.5 (cm) = 2.55 (cm) W): Tissue and other Non-Viable, Fibrin/Slough, Subcutaneous material debrided: Instrument: Curette Bleeding: Minimum Hemostasis Achieved: Pressure End Time: 15:15 Procedural Pain: 2 Post Procedural Pain: 2 Response to Treatment: Procedure was tolerated well Post Debridement Measurements of Total Wound Length: (cm) 1.7 Width: (cm) 1.5 Depth: (cm) 0.1 Volume: (cm) 0.2 Character of Wound/Ulcer Post Requires Further Debridement Debridement: Severity of Tissue Post Debridement: Fat layer exposed Post Procedure Diagnosis Same as Pre-procedure Electronic  Signature(s) Signed: 03/09/2017 4:40:29 PM By: Elpidio Eric BSN, RN Signed: 03/10/2017 7:58:45 AM By: Baltazar Najjar MD [name], [name] ([id number]) Entered By: Baltazar Najjar on 03/09/2017 15:29:54 [name] -------------------------------------------------------------------------------- Debridement Details [name] Date of Service: 03/09/2017 2:45 PM Medical Record Patient Account Number: [id number] Number: Treating RN: Clover Mealy, RN, BSN, Rita 31-Dec-1930 [phone] y.o. Other Clinician: Date of Birth/Sex: Female) Treating ROBSON, MICHAEL Primary Care Provider: Guy Begin Provider/Extender: G Referring Provider: Guy Begin Weeks in Treatment: 4 Debridement Performed for Wound #4 Right,Medial Lower Leg Assessment:  Performed By: Physician Maxwell Caul, MD Debridement: Debridement Pre-procedure Yes - 15:10 Verification/Time Out Taken: Start Time: 15:10 Pain Control: Lidocaine 4% Topical Solution Level: Skin/Subcutaneous Tissue Total Area Debrided (L x 9.5 (cm) x 5.5 (cm) = 52.25 (cm) W): Tissue and other Non-Viable, Fibrin/Slough, Subcutaneous material debrided: Instrument: Curette Bleeding: Minimum Hemostasis Achieved: Pressure End Time: 15:15 Procedural Pain: 2 Post Procedural Pain: 2 Response to Treatment: Procedure was tolerated well Post Debridement Measurements of Total Wound Length: (cm) 9.5 Width: (cm) 5.5 Depth: (cm) 0.1 Volume: (cm) 4.104 Character of Wound/Ulcer Post Requires Further Debridement Debridement: Severity of Tissue Post Debridement: Fat layer exposed Post Procedure Diagnosis Same as Pre-procedure Electronic Signature(s) Signed: 03/09/2017 4:40:29 PM By: Elpidio Eric BSN, RN Signed: 03/10/2017 7:58:45 AM By: Baltazar Najjar MD [name], [name] ([id number]) Entered By: Baltazar Najjar on 03/09/2017 15:30:03 [name], [name]  ([id number]) -------------------------------------------------------------------------------- HPI Details [name] Date of Service: 03/09/2017 2:45 PM Medical Record Patient Account Number: [id number] Number: Treating RN: Clover Mealy, RN, BSN, Rita 1931/05/19 [phone] y.o. Other Clinician: Date of Birth/Sex: Female) Treating ROBSON, MICHAEL Primary Care Provider: Guy Begin Provider/Extender: G Referring Provider: Guy Begin Weeks in Treatment: 4 History of Present Illness HPI Description: 06/10/16; this is an 83 year old woman who lives near [place] with family members. She saw her primary doctor roughly over a week ago and was referred to the ER for a cellulitis in the right medial leg. She was given a round of IV antibiotics in the emergency room and discharged on oral Keflex which she is taking. She is been left with a uncomfortable leg area on the right medial lower leg. Her ABIs in this clinic were 0.8 on the right 0.6 on the left. She does not describe claudication. She is not a diabetic. [name] work in the ER showed a normal BUN and creatinine on 6/20 at 18 and 0.92 respectively white count was 10.1 hemoglobin at 13.8. She did not have any imaging studies. I can see no vascular evaluations in Epic. She does not have a prior wound history 06/17/16; the area affected on her right medial ankle is an area I thought was due to tissue damage from cellulitis last week. She is going for venous reflux studies this afternoon, these were not ordered in this clinic at least not by me. The patient does not give a prior history of damage to the skin in this area although I wonder if she actually might not of noticed it. We have been using's Aquacel Ag under a wrap although we will not be able to wrap her today 06/24/16: pt reports today she hasn't heard from the vascular clinic regarding proceeding with arterial studies. our office will assist with this today. she denies s/s  of infection. denies problems with her wraps. 07/07/16; since I last saw this woman she was admitted to hospital from 7/16 through 7/18 with sepsis syndrome felt to be secondary to her ulcer. She was treated with Vanco and Zosyn the hospital discharged on Septra. Blood cultures were negative. Urine culture showed multiple species she is now feeling well using Santyl with border foam to her wound. [facility] 07/14/16; no major improvement here. She has a quarter-sized wound covered with thick adherent slough on the lateral aspect of her right leg with several surrounding satellite lesions in a similar state. She has had 2 recent bouts of cellulitis, one before she came to this clinic and one required a recent hospitalization. I have asked  for arterial studies I don't believe these have ever been done. My notes suggest from early in July that she went for reflux studies although I don't know that I have ever seen these either, she did have a DVT rule out but I did not order this, this did not show a DVT but did show a Baker's cyst 07/21/2016 -- the patient had an arterial duplex study done with waveform suggestive of right femoropopliteal disease. There is also small vessel disease bilaterally. Right ABI was in the normal range of 1.0 and a TBI was 0.74. Left ABI is abnormal at 0.69 with a TBI of 0.40. Three-vessel runoff on the right and a two-vessel runoff on the left. She has an appointment to see Dr. Kirke Corin later today. 07/28/2016 -- was seen by Dr. Kirke Corin who reviewed her arterial studies with an ABI of 0.94 on the right and 0.69 on the left with normal right toe brachial index. Duplex showed diffuse atherosclerosis without discrete Yurchak, Tanashia J. ([id number]) stenosis and there was a three-vessel runoff on the right below the knee. Based on these studies and the location of the ulcer he thought it was venous commended she continue with wound care consults. 08/04/16; I note the  patient's reasonably functional arterial supply which is fortunate. The wound bed appears to be smaller. Debrided of surface slough. She is been using Iodoflex 08/11/16: Only a very small open area remains. However it has some depth 08/18/16; small open area is smaller. Still open however. 08/25/16; the area has totally closed down and epithelialized. READMISSION 02/09/17; this is an 83 year old woman who is not a diabetic or smoker. She was in our clinic in the summer into mid-September with a wound on her right medial calf that. This eventually closed down. I thought this was mostly venous. She did have evidence of PAD and she was seen by Dr. Kirke Corin. At that point she had an ABI of 0.94 on the right 0.69 on the left with a normal right toe brachial index. It was felt that she had enough blood flow to close her wound and that happened. She was discharged with stockings although I don't get the sense that she really use them she tells me that roughly a week or 2 she developed swelling in her bilateral lower legs and then noted wounds on her right lateral malleolus and left medial calf. The on this she is not really sure how this happened. She is not having a lot of pain although she is not ambulatory that much only in her home. Other than weight loss she has not noticed any other issues no specific claudication no chest pain. 02/16/17; the patient continues with 2 wounds on the right lateral malleolus and left medial calf. Both of these are painful. She has chronic venous insufficiency but also known PAD and is previously seen Dr. Kirke Corin. We have arranged vascular follow-up with Dr. Kirke Corin to look at the arterial situation 02/25/2017 -- patient was brought in today because she missed her appointment last week with Dr. Leanord Hawking and is having a lot of pain and drainage from her wounds. The interim she was seen by Dr. Bascom Levels who did a duplex of the right lower extremity which shows a right ABI of 0.95 and left  ABI 0.74. There was also a abdominal aortogram with a bilateral extremity angiography done for significant peripheral vascular disease. The right lower extremity showed diffuse SFA and popliteal disease with significant stenosis affecting the TP trunk just before the  origin of the peritoneal artery. The left lower extremity showed moderate left SFA stenosis with occluded popliteal artery and tibioperoneal vessels with only visible collateral. The opinion was the patient had no endovascular surgical revascularization options of the left lower extremity and Dr. Bascom Levels would discuss it with Dr. Randie Heinz for a second opinion. Endovascular intervention on the right TP trunk could be considered. 03/03/17; the patient arrives back today having last seen Dr. Meyer Russel. As noted she has severe PAD which is not amenable to either bypass surgery or endovascular interventions on the left. Endovascular interventions in the right TP trunk could be considered. She has large wounds on the right medial leg and a sizable wound on the right lateral leg. Small wound on the left medial leg. She is really in a lot of discomfort. Using Santyl on the right and I believe Prisma on the left. 03/10/17; the patient arrives today again with large wounds on the right lateral lower extremity, right medial lower extremity. And a small punched out wound on the left medial lower extremity. These are in the setting of severe non-revascularizable PAD. She continues to complain of discomfort although I don't think right now this is unmanageable. We have been using Santyl. We are arranging getting [facility] home health [facility]] Electronic Signature(s) Signed: 03/10/2017 7:58:45 AM By: Baltazar Najjar MD Entered By: Baltazar Najjar on 03/09/2017 15:32:03 [name], [name] ([id number]) [name], [name] ([id number]) -------------------------------------------------------------------------------- Physical Exam Details [name], [name]. Date of Service: 03/09/2017 2:45 PM Medical Record Patient Account Number: [id number] Number: Treating RN: Clover Mealy, RN, BSN, Rita Feb 03, 1931 [phone] y.o. Other Clinician: Date of Birth/Sex: Female) Treating ROBSON, MICHAEL Primary Care Provider: Guy Begin Provider/Extender: G Referring Provider: Guy Begin Weeks in Treatment: 4 Constitutional Sitting or standing Blood Pressure is within target range for patient.. Pulse regular and within target range for patient.Marland Kitchen Respirations regular, non-labored and within target range.. Temperature is normal and within the target range for the patient.. Patient's appearance is neat and clean. Appears in no acute distress. Well nourished and well developed.. Cardiovascular Pedal pulses absent bilaterally.. Notes Wound exam; there is a substantial wound on the right medial ankle. Covered with an adherent surface. I debrided this slightly with an open #5 curet. There is some bleeding here. Also on the lateral aspect of the right ankle debrided with a #5 curet of necrotic surface material the base of this wound actually looks some better. oSmall area on the left medial calf also debrided with a #3 curet. Surface this looks fairly stable after debridement. Electronic Signature(s) Signed: 03/10/2017 7:58:45 AM By: Baltazar Najjar MD Entered By: Baltazar Najjar on 03/09/2017 15:38:18 Bi, [name] ([id number]) -------------------------------------------------------------------------------- Physician Orders Details [name], [name]. Date of Service: 03/09/2017 2:45 PM Medical Record Patient Account Number: [id number] Number: Treating RN: Clover Mealy, RN, BSN, Rita Aug 22, 1931 [phone] y.o. Other Clinician: Date of Birth/Sex: Female) Treating ROBSON, MICHAEL Primary Care Provider: Guy Begin Provider/Extender: G Referring Provider: [name] in Treatment: 4 Verbal / Phone Orders: No Diagnosis  Coding Wound Cleansing Wound #2 Right,Lateral Malleolus o Cleanse wound with mild soap and water Wound #3 Left,Medial Lower Leg o Clean wound with Normal Saline. o Cleanse wound with mild soap and water o Cleanse wound with mild soap and water Wound #4 Right,Medial Lower Leg o Cleanse wound with mild soap and water Anesthetic Wound #2 Right,Lateral Malleolus o Topical Lidocaine 4% cream applied to wound bed prior to debridement - clinic use only Wound #  3 Left,Medial Lower Leg o Topical Lidocaine 4% cream applied to wound bed prior to debridement - clinic use only Wound #4 Right,Medial Lower Leg o Topical Lidocaine 4% cream applied to wound bed prior to debridement - clinic use only Primary Wound Dressing Wound #2 Right,Lateral Malleolus o Santyl Ointment Wound #3 Left,Medial Lower Leg o Santyl Ointment Wound #4 Right,Medial Lower Leg o Santyl Ointment Secondary Dressing Wound #2 Right,Lateral Malleolus o ABD pad Kubitz, Gearldene J. ([id number]) o Gauze and Kerlix/Conform Wound #3 Left,Medial Lower Leg o ABD pad o Gauze and Kerlix/Conform Wound #4 Right,Medial Lower Leg o ABD pad o Gauze and Kerlix/Conform Dressing Change Frequency Wound #2 Right,Lateral Malleolus o Change dressing every day. - Home Health to follow Medicare/Insurance guidelines. May teach available family member wound care. Wound #3 Left,Medial Lower Leg o Change dressing every day. - Home Health to follow Medicare/Insurance guidelines. May teach available family member wound care. Wound #4 Right,Medial Lower Leg o Change dressing every day. - Home Health to follow Medicare/Insurance guidelines. May teach available family member wound care. Follow-up Appointments Wound #2 Right,Lateral Malleolus o Return Appointment in 1 week. Wound #3 Left,Medial Lower Leg o Return Appointment in 1 week. Wound #4 Right,Medial Lower Leg o Return Appointment in 1 week. Edema  Control o Elevate legs to the level of the heart and pump ankles as often as possible Additional Orders / Instructions Wound #2 Right,Lateral Malleolus o Increase protein intake. Wound #3 Left,Medial Lower Leg o Increase protein intake. Wound #4 Right,Medial Lower Leg o Increase protein intake. Home Health Wound #2 Right,Lateral Malleolus [name], [name] ([id number]) o Initiate Home [facility]illed [facility] - [facility] o Home Health Nurse may visit PRN to address patientos wound care needs. o FACE TO FACE ENCOUNTER: MEDICARE and MEDICAID PATIENTS: I certify that this patient is under my care and that I had a face-to-face encounter that meets the physician face-to-face encounter requirements with this patient on this date. The encounter with the patient was in whole or in part for the following MEDICAL CONDITION: (primary reason for Home Healthcare) MEDICAL NECESSITY: I certify, that based on my findings, NURSING services are a medically necessary home health service. HOME BOUND STATUS: I certify that my clinical findings support that this patient is homebound (i.e., Due to illness or injury, pt requires aid of supportive devices such as crutches, cane, wheelchairs, walkers, the use of special transportation or the assistance of another person to leave their place of residence. There is a normal inability to leave the home and doing so requires considerable and taxing effort. Other absences are for medical reasons / religious services and are infrequent or of short duration when for other reasons). o If current dressing causes regression in wound condition, may D/C ordered dressing product/s and apply Normal Saline Moist Dressing daily until next Wound Healing Center / Other MD appointment. Notify Wound Healing Center of regression in wound condition at [phone]. o Please direct any NON-WOUND related issues/requests for orders to patient's Primary  Care Physician Wound #3 Left,Medial Lower Leg o [facility]iate [facility] o Home Health Nurse may visit PRN to address patientos wound care needs. o FACE TO FACE ENCOUNTER: MEDICARE and MEDICAID PATIENTS: I certify that this patient is under my care and that I had a face-to-face encounter that meets the physician face-to-face encounter requirements with this patient on this date. The encounter with the patient was in whole or in part for the following  MEDICAL CONDITION: (primary reason for Home Healthcare) MEDICAL NECESSITY: I certify, that based on my findings, NURSING services are a medically necessary home health service. HOME BOUND STATUS: I certify that my clinical findings support that this patient is homebound (i.e., Due to illness or injury, pt requires aid of supportive devices such as crutches, cane, wheelchairs, walkers, the use of special transportation or the assistance of another person to leave their place of residence. There is a normal inability to leave the home and doing so requires considerable and taxing effort. Other absences are for medical reasons / religious services and are infrequent or of short duration when for other reasons). o If current dressing causes regression in wound condition, may D/C ordered dressing product/s and apply Normal Saline Moist Dressing daily until next Wound Healing Center / Other MD appointment. Notify Wound Healing Center of regression in wound condition at [phone]. o Please direct any NON-WOUND related issues/requests for orders to patient's Primary Care Physician Wound #4 Right,Medial Lower Leg o [facility] o Home Health Nurse may visit PRN to address patientos wound care needs. o FACE TO FACE ENCOUNTER: MEDICARE and MEDICAID PATIENTS: I certify that this patient is under my care and that I had a face-to-face encounter that  meets the physician face-to-face encounter requirements with this patient on this date. The encounter with the patient was in whole or in part for the following MEDICAL CONDITION: (primary reason for Home Healthcare) MEDICAL NECESSITY: I certify, that based on my findings, NURSING services are a medically necessary home health service. HOME BOUND STATUS: I certify that my clinical findings [name], [name] ([id number]) support that this patient is homebound (i.e., Due to illness or injury, pt requires aid of supportive devices such as crutches, cane, wheelchairs, walkers, the use of special transportation or the assistance of another person to leave their place of residence. There is a normal inability to leave the home and doing so requires considerable and taxing effort. Other absences are for medical reasons / religious services and are infrequent or of short duration when for other reasons). o If current dressing causes regression in wound condition, may D/C ordered dressing product/s and apply Normal Saline Moist Dressing daily until next Wound Healing Center / Other MD appointment. Notify Wound Healing Center of regression in wound condition at [phone]. o Please direct any NON-WOUND related issues/requests for orders to patient's Primary Care Physician Electronic Signature(s) Signed: 03/09/2017 4:40:29 PM By: Elpidio Eric BSN, RN Signed: 03/10/2017 7:58:45 AM By: Baltazar Najjar MD Entered By: Elpidio Eric on 03/09/2017 15:15:43 Copland, [name] ([id number]) -------------------------------------------------------------------------------- Problem List Details [name], LABO. Date of Service: 03/09/2017 2:45 PM Medical Record Patient Account Number: [id number] Number: Treating RN: Clover Mealy, RN, BSN, Rita 17-Sep-1931 [phone] y.o. 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 L97.221 Non-pressure chronic ulcer of left calf limited to 02/09/2017 Yes breakdown of skin I70.233 Atherosclerosis of native arteries of right leg with 02/25/2017 Yes ulceration of ankle I70.243 Atherosclerosis of native arteries of left leg with ulceration 02/25/2017 Yes of ankle Inactive Problems Resolved Problems Electronic Signature(s) Signed: 03/10/2017 7:58:45 AM By: Baltazar Najjar MD  Entered By: Baltazar Najjar on 03/09/2017 15:29:30 Hari, [name] ([id number]) -------------------------------------------------------------------------------- Progress Note Details Patient Name: MARDELL, SUTTLES. Date of Service: 03/09/2017 2:45 PM Medical Record Patient Account Number: [id number] Number: Treating RN: Clover Mealy, RN, BSN, Rita 02-23-1931 [phone] y.o. Other Clinician: Date of Birth/Sex: Female) Treating ROBSON, MICHAEL Primary Care Provider: Guy Begin Provider/Extender: G Referring Provider: Guy Begin Weeks in Treatment: 4 Subjective Chief Complaint Information obtained from Patient 06/10/16; the patient is here for review of a wound on the right anterior medial lower leg above the ankle History of Present Illness (HPI) 06/10/16; this is an 83 year old woman [place]. She saw her primary doctor roughly over a week ago and was referred to the ER for a cellulitis in the right medial leg. She was given a round of IV antibiotics in the emergency room and discharged on oral Keflex which she is taking. She is been left with a uncomfortable leg area on the right medial lower leg. Her ABIs in this clinic  were 0.8 on the right 0.6 on the left. She does not describe claudication. She is not a diabetic. [name] work in the ER showed a normal BUN and creatinine on 6/20 at 18 and 0.92 respectively white count was 10.1 hemoglobin at 13.8. She did not have any imaging studies. I can see no vascular evaluations in Epic. She does not have a prior wound history 06/17/16; the area affected on her right medial ankle is an area I thought was due to tissue damage from cellulitis last week. She is going for venous reflux studies this afternoon, these were not ordered in this clinic at least not by me. The patient does not give a prior history of damage to the skin in this area although I wonder if she actually might not of noticed it. We have been using's Aquacel Ag under a wrap although we will not be able to wrap her today 06/24/16: pt reports today she hasn't heard from the vascular clinic regarding proceeding with arterial studies. our office will assist with this today. she denies s/s of infection. denies problems with her wraps. 07/07/16; since I last saw this woman she was admitted to hospital from 7/16 through 7/18 with sepsis syndrome felt to be secondary to her ulcer. She was treated with Vanco and Zosyn the hospital discharged on Septra. Blood cultures were negative. Urine culture showed multiple species she is now feeling well using Santyl with border foam to her wound. [facility] has [facility] 07/14/16; no major improvement here. She has a quarter-sized wound covered with thick adherent slough on the lateral aspect of her right leg with several surrounding satellite lesions in a similar state. She has had 2 recent bouts of cellulitis, one before she came to this clinic and one required a recent hospitalization. I have asked for arterial studies I don't believe these have ever been done. My notes suggest from early in July that she went for reflux studies although I don't know that I have ever seen these  either, she did have a DVT rule out but I did not order this, this did not show a DVT but did show a Baker's cyst 07/21/2016 -- the patient had an arterial duplex study done with waveform suggestive of right femoropopliteal disease. There is also small vessel disease bilaterally. Right ABI was in the normal range of 1.0 and a TBI Correa, Euretha J. ([id number]) was 0.74. Left ABI is abnormal at 0.69 with a  TBI of 0.40. Three-vessel runoff on the right and a two-vessel runoff on the left. She has an appointment to see Dr. Kirke Corin later today. 07/28/2016 -- was seen by Dr. Kirke Corin who reviewed her arterial studies with an ABI of 0.94 on the right and 0.69 on the left with normal right toe brachial index. Duplex showed diffuse atherosclerosis without discrete stenosis and there was a three-vessel runoff on the right below the knee. Based on these studies and the location of the ulcer he thought it was venous commended she continue with wound care consults. 08/04/16; I note the patient's reasonably functional arterial supply which is fortunate. The wound bed appears to be smaller. Debrided of surface slough. She is been using Iodoflex 08/11/16: Only a very small open area remains. However it has some depth 08/18/16; small open area is smaller. Still open however. 08/25/16; the area has totally closed down and epithelialized. READMISSION 02/09/17; this is an 83 year old woman who is not a diabetic or smoker. She was in our clinic in the summer into mid-September with a wound on her right medial calf that. This eventually closed down. I thought this was mostly venous. She did have evidence of PAD and she was seen by Dr. Kirke Corin. At that point she had an ABI of 0.94 on the right 0.69 on the left with a normal right toe brachial index. It was felt that she had enough blood flow to close her wound and that happened. She was discharged with stockings although I don't get the sense that she really use them she tells me  that roughly a week or 2 she developed swelling in her bilateral lower legs and then noted wounds on her right lateral malleolus and left medial calf. The on this she is not really sure how this happened. She is not having a lot of pain although she is not ambulatory that much only in her home. Other than weight loss she has not noticed any other issues no specific claudication no chest pain. 02/16/17; the patient continues with 2 wounds on the right lateral malleolus and left medial calf. Both of these are painful. She has chronic venous insufficiency but also known PAD and is previously seen Dr. Kirke Corin. We have arranged vascular follow-up with Dr. Kirke Corin to look at the arterial situation 02/25/2017 -- patient was brought in today because she missed her appointment last week with Dr. Leanord Hawking and is having a lot of pain and drainage from her wounds. The interim she was seen by Dr. Bascom Levels who did a duplex of the right lower extremity which shows a right ABI of 0.95 and left ABI 0.74. There was also a abdominal aortogram with a bilateral extremity angiography done for significant peripheral vascular disease. The right lower extremity showed diffuse SFA and popliteal disease with significant stenosis affecting the TP trunk just before the origin of the peritoneal artery. The left lower extremity showed moderate left SFA stenosis with occluded popliteal artery and tibioperoneal vessels with only visible collateral. The opinion was the patient had no endovascular surgical revascularization options of the left lower extremity and Dr. Bascom Levels would discuss it with Dr. Randie Heinz for a second opinion. Endovascular intervention on the right TP trunk could be considered. 03/03/17; the patient arrives back today having last seen Dr. Meyer Russel. As noted she has severe PAD which is not amenable to either bypass surgery or endovascular interventions on the left. Endovascular interventions in the right TP trunk could be  considered. She has large wounds on  the right medial leg and a sizable wound on the right lateral leg. Small wound on the left medial leg. She is really in a lot of discomfort. Using Santyl on the right and I believe Prisma on the left. 03/10/17; the patient arrives today again with large wounds on the right lateral lower extremity, right medial lower extremity. And a small punched out wound on the left medial lower extremity. These are in the setting of severe non-revascularizable PAD. She continues to complain of discomfort although I don't think right now this is unmanageable. We have been using Santyl. We are arranging getting [facility] health [facility]] CARLEAH, YABLONSKI. ([id number]) Objective Constitutional Sitting or standing Blood Pressure is within target range for patient.. Pulse regular and within target range for patient.Marland Kitchen Respirations regular, non-labored and within target range.. Temperature is normal and within the target range for the patient.. Patient's appearance is neat and clean. Appears in no acute distress. Well nourished and well developed.. Vitals Time Taken: 2:48 PM, Height: 66 in, Weight: 159 lbs, BMI: 25.7, Temperature: 97.4 F, Pulse: 80 bpm, Respiratory Rate: 16 breaths/min, Blood Pressure: 138/74 mmHg. Cardiovascular Pedal pulses absent bilaterally.. General Notes: Wound exam; there is a substantial wound on the right medial ankle. Covered with an adherent surface. I debrided this slightly with an open #5 curet. There is some bleeding here. Also on the lateral aspect of the right ankle debrided with a #5 curet of necrotic surface material the base of this wound actually looks some better. Small area on the left medial calf also debrided with a #3 curet. Surface this looks fairly stable after debridement. Integumentary (Hair, Skin) Wound #2 status is Open. Original cause of wound was Gradually Appeared. The wound is located on the Right,Lateral Malleolus.  The wound measures 3.7cm length x 2.2cm width x 0.2cm depth; 6.393cm^2 area and 1.279cm^3 volume. There is Fat Layer (Subcutaneous Tissue) Exposed exposed. There is no tunneling or undermining noted. There is a medium amount of serosanguineous drainage noted. The wound margin is distinct with the outline attached to the wound base. There is small (1-33%) pink, pale granulation within the wound bed. There is a large (67-100%) amount of necrotic tissue within the wound bed including Adherent Slough. The periwound skin appearance exhibited: Hemosiderin Staining, Mottled. The periwound skin appearance did not exhibit: Callus, Crepitus, Excoriation, Induration, Rash, Scarring, Dry/Scaly, Maceration, Atrophie Blanche, Cyanosis, Ecchymosis, Pallor, Rubor, Erythema. Periwound temperature was noted as No Abnormality. The periwound has tenderness on palpation. Wound #3 status is Open. Original cause of wound was Gradually Appeared. The wound is located on the Left,Medial Lower Leg. The wound measures 1.7cm length x 1.5cm width x 0.1cm depth; 2.003cm^2 area and 0.2cm^3 volume. There is Fat Layer (Subcutaneous Tissue) Exposed exposed. There is no tunneling or undermining noted. There is a medium amount of serosanguineous drainage noted. The wound margin is distinct with the outline attached to the wound base. There is small (1-33%) pink, pale granulation within the wound bed. There is a large (67-100%) amount of necrotic tissue within the wound bed including Adherent Slough. The periwound skin appearance exhibited: Hemosiderin Staining, Mottled. The periwound skin appearance did not exhibit: Callus, Crepitus, Excoriation, Induration, Rash, Scarring, Dry/Scaly, Maceration, Atrophie Blanche, Cyanosis, Ecchymosis, Pallor, Rubor, Erythema. Periwound temperature was noted as No Abnormality. The periwound has tenderness on palpation. SHARLEE, RUFINO ([id number]) Wound #4 status is Open. Original cause of wound was  Gradually Appeared. The wound is located on the Right,Medial Lower Leg. The wound measures  9.5cm length x 5.5cm width x 0.1cm depth; 41.037cm^2 area and 4.104cm^3 volume. There is Fat Layer (Subcutaneous Tissue) Exposed exposed. There is no tunneling or undermining noted. There is a large amount of serosanguineous drainage noted. The wound margin is distinct with the outline attached to the wound base. There is small (1-33%) pink, pale granulation within the wound bed. There is a large (67-100%) amount of necrotic tissue within the wound bed including Adherent Slough. The periwound skin appearance exhibited: Hemosiderin Staining, Mottled. The periwound skin appearance did not exhibit: Callus, Crepitus, Excoriation, Induration, Rash, Scarring, Dry/Scaly, Maceration, Atrophie Blanche, Cyanosis, Ecchymosis, Pallor, Rubor, Erythema. Periwound temperature was noted as No Abnormality. The periwound has tenderness on palpation. 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 There was a Skin/Subcutaneous Tissue Debridement ([id number]) debridement with total area of 8.14 sq cm performed by Maxwell Caul, MD. with the following instrument(s): Curette to remove Non-Viable tissue/material including Fibrin/Slough and Subcutaneous after achieving pain control using Lidocaine 4% Topical Solution. A time out was conducted at 15:10, prior to the start of the procedure. A Minimum amount of bleeding  was controlled with Pressure. The procedure was tolerated well with a pain level of 2 throughout and a pain level of 2 following the procedure. Post Debridement Measurements: 3.7cm length x 2.2cm width x 0.2cm depth; 1.279cm^3 volume. Character of Wound/Ulcer Post Debridement requires further debridement. Severity of Tissue Post Debridement is: Fat layer exposed. Post procedure Diagnosis Wound #2: Same as Pre-Procedure Wound #3 Wound #3 is a Venous Leg Ulcer located on the Left,Medial Lower Leg . There was a Skin/Subcutaneous Tissue Debridement ([id number]) debridement with total area of 2.55 sq cm performed by NATONYA, FINSTAD, [name] ([id number]) Venetia Night, MD. with the following instrument(s): Curette to remove Non-Viable tissue/material including Fibrin/Slough and Subcutaneous after achieving pain control using Lidocaine 4% Topical Solution. A time out was conducted at 15:10, prior to the start of the procedure. A Minimum amount of bleeding was controlled with Pressure. The procedure was tolerated well with a pain level of 2 throughout and a pain level of 2 following the procedure. Post Debridement Measurements: 1.7cm length x 1.5cm width x 0.1cm depth; 0.2cm^3 volume. Character of Wound/Ulcer Post Debridement requires further debridement. Severity of Tissue Post Debridement is: Fat layer exposed. Post procedure Diagnosis Wound #3: Same as Pre-Procedure Wound #4 Wound #4 is a Venous Leg Ulcer located on the Right,Medial Lower Leg . There was a Skin/Subcutaneous Tissue Debridement ([id number]) debridement with total area of 52.25 sq cm performed by Maxwell Caul, MD. with the following instrument(s): Curette to remove Non-Viable tissue/material including Fibrin/Slough and Subcutaneous after achieving pain control using Lidocaine 4% Topical Solution. A time out was conducted at 15:10, prior to the start of the procedure. A Minimum amount of bleeding was controlled with Pressure. The  procedure was tolerated well with a pain level of 2 throughout and a pain level of 2 following the procedure. Post Debridement Measurements: 9.5cm length x 5.5cm width x 0.1cm depth; 4.104cm^3 volume. Character of Wound/Ulcer Post Debridement requires further  debridement. Severity of Tissue Post Debridement is: Fat layer exposed. Post procedure Diagnosis Wound #4: Same as Pre-Procedure Plan Wound Cleansing: Wound #2 Right,Lateral Malleolus: Cleanse wound with mild soap and water Wound #3 Left,Medial Lower Leg: Clean wound with Normal Saline. Cleanse wound with mild soap and water Cleanse wound with mild soap and water Wound #4 Right,Medial Lower Leg: Cleanse wound with mild soap and water Anesthetic: Wound #2 Right,Lateral Malleolus: Topical Lidocaine 4% cream applied to wound bed prior to debridement - clinic use only Wound #3 Left,Medial Lower Leg: Topical Lidocaine 4% cream applied to wound bed prior to debridement - clinic use only Wound #4 Right,Medial Lower Leg: Topical Lidocaine 4% cream applied to wound bed prior to debridement - clinic use only Primary Wound Dressing: Wound #2 Right,Lateral Malleolus: Santyl Ointment LEHUA, FLORES. ([id number]) Wound #3 Left,Medial Lower Leg: Santyl Ointment Wound #4 Right,Medial Lower Leg: Santyl Ointment Secondary Dressing: Wound #2 Right,Lateral Malleolus: ABD pad Gauze and Kerlix/Conform Wound #3 Left,Medial Lower Leg: ABD pad Gauze and Kerlix/Conform Wound #4 Right,Medial Lower Leg: ABD pad Gauze and Kerlix/Conform Dressing Change Frequency: Wound #2 Right,Lateral Malleolus: Change dressing every day. - Home Health to follow Medicare/Insurance guidelines. May teach available family member wound care. Wound #3 Left,Medial Lower Leg: Change dressing every day. - Home Health to follow Medicare/Insurance guidelines. May teach available family member wound care. Wound #4 Right,Medial Lower Leg: Change dressing every day. -  Home Health to follow Medicare/Insurance guidelines. May teach available family member wound care. Follow-up Appointments: Wound #2 Right,Lateral Malleolus: Return Appointment in 1 week. Wound #3 Left,Medial Lower Leg: Return Appointment in 1 week. Wound #4 Right,Medial Lower Leg: Return Appointment in 1 week. Edema Control: Elevate legs to the level of the heart and pump ankles as often as possible Additional Orders / Instructions: Wound #2 Right,Lateral Malleolus: Increase protein intake. Wound #3 Left,Medial Lower Leg: Increase protein intake. Wound #4 Right,Medial Lower Leg: Increase protein intake. Home Health: Wound #2 Right,Lateral Malleolus: Initiate Home Health for Skilled Nursing - [facility] Nurse may visit PRN to address patient s wound care needs. FACE TO FACE ENCOUNTER: MEDICARE and MEDICAID PATIENTS: I certify that this patient is under my care and that I had a face-to-face encounter that meets the physician face-to-face encounter requirements with this patient on this date. The encounter with the patient was in whole or in part for the following MEDICAL CONDITION: (primary reason for Home Healthcare) MEDICAL NECESSITY: I certify, that based on my findings, NURSING services are a medically necessary home health service. HOME BOUND STATUS: I certify that my clinical findings support that this patient is homebound (i.e., Due to illness or injury, pt requires aid of supportive devices such as crutches, cane, wheelchairs, walkers, the use Ibarra, Daylynn J. ([id number]) of special transportation or the assistance of another person to leave their place of residence. There is a normal inability to leave the home and doing so requires considerable and taxing effort. Other absences are for medical reasons / religious services and are infrequent or of short duration when for other reasons). If current dressing causes regression in wound condition, may D/C  ordered dressing product/s and apply Normal Saline Moist Dressing daily until next Wound Healing Center / Other MD appointment. Notify Wound Healing Center of regression in wound condition at [phone]. Please direct any NON-WOUND related issues/requests for orders to patient's Primary Care Physician Wound #3 Left,Medial Lower Leg: Initiate [facility]illed [facility] - [facility]  Nurse may visit PRN to address patient s wound care needs. FACE TO FACE ENCOUNTER: MEDICARE and MEDICAID PATIENTS: I certify that this patient is under my care and that I had a face-to-face encounter that meets the physician face-to-face encounter requirements with this patient on this date. The encounter with the patient was in whole or in part for the following MEDICAL CONDITION: (primary reason for Home Healthcare) MEDICAL NECESSITY: I certify, that based on my findings, NURSING services are a medically necessary home health service. HOME BOUND STATUS: I certify that my clinical findings support that this patient is homebound (i.e., Due to illness or injury, pt requires aid of supportive devices such as crutches, cane, wheelchairs, walkers, the use of special transportation or the assistance of another person to leave their place of residence. There is a normal inability to leave the home and doing so requires considerable and taxing effort. Other absences are for medical reasons / religious services and are infrequent or of short duration when for other reasons). If current dressing causes regression in wound condition, may D/C ordered dressing product/s and apply Normal Saline Moist Dressing daily until next Wound Healing Center / Other MD appointment. Notify Wound Healing Center of regression in wound condition at [phone]. Please direct any NON-WOUND related issues/requests for orders to patient's Primary Care Physician Wound #4 Right,Medial Lower Leg: Initiate Home Health for  [facility] Nurse may visit PRN to address patient s wound care needs. FACE TO FACE ENCOUNTER: MEDICARE and MEDICAID PATIENTS: I certify that this patient is under my care and that I had a face-to-face encounter that meets the physician face-to-face encounter requirements with this patient on this date. The encounter with the patient was in whole or in part for the following MEDICAL CONDITION: (primary reason for Home Healthcare) MEDICAL NECESSITY: I certify, that based on my findings, NURSING services are a medically necessary home health service. HOME BOUND STATUS: I certify that my clinical findings support that this patient is homebound (i.e., Due to illness or injury, pt requires aid of supportive devices such as crutches, cane, wheelchairs, walkers, the use of special transportation or the assistance of another person to leave their place of residence. There is a normal inability to leave the home and doing so requires considerable and taxing effort. Other absences are for medical reasons / religious services and are infrequent or of short duration when for other reasons). If current dressing causes regression in wound condition, may D/C ordered dressing product/s and apply Normal Saline Moist Dressing daily until next Wound Healing Center / Other MD appointment. Notify Wound Healing Center of regression in wound condition at [phone]. Please direct any NON-WOUND related issues/requests for orders to patient's Primary Care Physician #1 continue with Santyl, ABD pad Kerlix to all wounds Corzine, Errin J. ([id number]) #2 as I currently understand things she has not felt to be amenable to either endovascular or surgical revascularization. I believe Dr. Kinmundy Sinkita spoke to Dr. Randie Heinzain at vein and vascular although I need to verify this. #3 in this setting I believe it is reasonable to go ahead and attempt a trial of both mechanical and enzymatic debridement.  Iodoflex might be a reasonable alternative in this setting if we don't make any headway with Santyl. When she was here previously we did get healing although the wounds were not as substantial as they are currently. Electronic Signature(s) Signed: 03/10/2017 7:58:45 AM By: Baltazar Najjarobson, Michael MD Entered By: Baltazar Najjarobson, Michael on 03/09/2017 15:40:29  CARLOYN, LAHUE ([id number]) -------------------------------------------------------------------------------- SuperBill Details Patient Name: SHENG, PRITZ. Date of Service: 03/09/2017 Medical Record Patient Account Number: [id number] Number: Treating RN: Clover Mealy, RN, BSN, [place] 20-Jan-1931 [phone] y.o. 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 L97.221 Non-pressure chronic ulcer of left calf limited to breakdown of skin I70.233 Atherosclerosis of native arteries of right leg with ulceration of ankle I70.243 Atherosclerosis of native arteries of left leg with ulceration of ankle Facility Procedures CPT4 Code Description: [id number] - DEB SUBQ TISSUE 20 SQ CM/< ICD-10 Description Diagnosis L97.313 Non-pressure chronic ulcer of right ankle with necrosi I70.233 Atherosclerosis of native arteries of right leg with u Modifier: s of muscle lceration of ank Quantity: 1 le CPT4 Code Description: 82956213 11045 - DEB SUBQ TISS EA ADDL 20CM ICD-10 Description Diagnosis I70.243 Atherosclerosis of native arteries of left leg with ul Modifier: ceration of ankl Quantity: 3 e Physician Procedures CPT4 Code Description: 0865784 11042 - WC PHYS SUBQ TISS 20 SQ CM ICD-10 Description  Diagnosis 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

## 2017-03-10 NOTE — Progress Notes (Signed)
GRAYLYN, BUNNEY ([id number]) Visit Report for 03/09/2017 Arrival Information Details [name], [name]. Date of Service: 03/09/2017 2:45 PM Medical Record Patient Account Number: [id number] Number: Treating RN: Clover Mealy, RN, BSN, Rita 1931-06-16 [phone] y.o. Other Clinician: Date of Birth/Sex: Female) Treating ROBSON, MICHAEL Primary Care Morelia Cassells: Guy Begin Reika Callanan/Extender: G Referring Josey Forcier: [name] in Treatment: 4 Visit Information History Since Last Visit All ordered tests and consults were completed: No Patient Arrived: Wheel Chair Added or deleted any medications: No Arrival Time: 14:40 Any new allergies or adverse reactions: No Accompanied By: son Had a fall or experienced change in No activities of daily living that may affect Transfer Assistance: None risk of falls: Patient Identification Verified: Yes Signs or symptoms of abuse/neglect since last No Secondary Verification Process Yes visito Completed: Hospitalized since last visit: No Patient Requires Transmission-Based No Has Dressing in Place as Prescribed: Yes Precautions: Pain Present Now: Yes Patient Has Alerts: No Electronic Signature(s) Signed: 03/09/2017 4:40:29 PM By: Elpidio Eric BSN, RN Entered By: Elpidio Eric on 03/09/2017 14:47:18 [name], [name] ([id number]) -------------------------------------------------------------------------------- Encounter Discharge Information Details [name] Date of Service: 03/09/2017 2:45 PM Medical Record Patient Account Number: [id number] Number: Treating RN: Clover Mealy, RN, BSN, Rita [birth year] [phone] y.o. Other Clinician: Date of Birth/Sex: Female) Treating ROBSON, MICHAEL Primary Care Aayat Hajjar: Guy Begin Kewanna Kasprzak/Extender: G Referring Sonal Dorwart: [name] in Treatment: 4 Encounter Discharge Information Items Discharge Pain Level: 0 Discharge Condition: Stable Ambulatory Status:  Wheelchair Discharge Destination: Home Transportation: Private Auto Accompanied By: son Schedule Follow-up Appointment: No Medication Reconciliation completed and provided to Patient/Care No Evamae Rowen: Provided on Clinical Summary of Care: 03/09/2017 Form Type Recipient Paper Patient CF Electronic Signature(s) Signed: 03/09/2017 4:40:29 PM By: Elpidio Eric BSN, RN Previous Signature: 03/09/2017 3:24:10 PM Version By: Gwenlyn Perking Entered By: Elpidio Eric on 03/09/2017 15:27:31 [name], [name] ([id number]) -------------------------------------------------------------------------------- Lower Extremity Assessment Details [name] Date of Service: 03/09/2017 2:45 PM Medical Record Patient Account Number: [id number] Number: Treating RN: Clover Mealy, RN, BSN, Rita 1931-10-29 [phone] y.o. Other Clinician: Date of Birth/Sex: Female) Treating ROBSON, MICHAEL Primary Care Jayko Voorhees: Guy Begin Adabella Stanis/Extender: G Referring Alleyne Lac: [name] in Treatment: 4 Vascular Assessment Claudication: Claudication Assessment [Left:None] [Right:None] Pulses: Dorsalis Pedis Palpable: [Left:Yes] [Right:Yes] Posterior Tibial Extremity colors, hair growth, and conditions: Extremity Color: [Left:Hyperpigmented] [Right:Hyperpigmented] Hair Growth on Extremity: [Left:No] [Right:No] Temperature of Extremity: [Left:Warm] [Right:Warm] Capillary Refill: [Left:< 3 seconds] [Right:< 3 seconds] Toe Nail Assessment Left: Right: Thick: Yes Yes Discolored: Yes Yes Deformed: Yes Yes Improper Length and Hygiene: Yes Yes Electronic Signature(s) Signed: 03/09/2017 4:40:29 PM By: Elpidio Eric BSN, RN Entered By: Elpidio Eric on 03/09/2017 14:48:50 [name], [name] ([id number]) -------------------------------------------------------------------------------- Multi Wound Chart Details [name] Date of Service: 03/09/2017 2:45 PM Medical Record Patient  Account Number: [id number] Number: Treating RN: Clover Mealy, RN, BSN, Rita 11/28/1931 [phone] y.o. Other Clinician: Date of Birth/Sex: Female) Treating ROBSON, MICHAEL Primary Care Jehu Mccauslin: Guy Begin Hermilo Dutter/Extender: G Referring Edyth Glomb: Guy Begin Weeks in Treatment: 4 Vital Signs Height(in): 66 Pulse(bpm): 80 Weight(lbs): 159 Blood Pressure 138/74 (mmHg): Body Mass Index(BMI): 26 Temperature(F): 97.4 Respiratory Rate 16 (breaths/min): Photos: [2:No Photos] [3:No Photos] [4:No Photos] Wound Location: [2:Right Malleolus - Lateral] [3:Left Lower Leg - Medial] [4:Right Lower Leg - Medial] Wounding Event: [2:Gradually Appeared] [3:Gradually Appeared] [4:Gradually Appeared] Primary Etiology: [2:Venous Leg Ulcer] [3:Venous Leg Ulcer] [4:Venous Leg Ulcer] Comorbid History: [2:Cataracts, Arrhythmia, Hypertension, Osteoarthritis] [3:Cataracts, Arrhythmia, Hypertension, Osteoarthritis] [4:Cataracts,  Arrhythmia, Hypertension, Osteoarthritis] Date Acquired: [2:01/19/2017] [3:01/19/2017] [4:02/16/2017] Weeks of Treatment: [2:4] [3:4] [4:3] Wound Status: [2:Open] [3:Open] [4:Open] Measurements L x W x D 3.7x2.2x0.2 [3:1.7x1.5x0.1] [4:9.5x5.5x0.1] (cm) Area (cm) : [2:6.393] [3:2.003] [4:41.037] Volume (cm) : [2:1.279] [3:0.2] [4:4.104] % Reduction in Area: [2:-54.50%] [3:-409.70%] [4:-26038.20%] % Reduction in Volume: -208.90% [3:-412.80%] [4:-25550.00%] Classification: [2:Partial Thickness] [3:Partial Thickness] [4:Partial Thickness] Exudate Amount: [2:Medium] [3:Medium] [4:Large] Exudate Type: [2:Serosanguineous] [3:Serosanguineous] [4:Serosanguineous] Exudate Color: [2:red, brown] [3:red, brown] [4:red, brown] Wound Margin: [2:Distinct, outline attached] [3:Distinct, outline attached] [4:Distinct, outline attached] Granulation Amount: [2:Small (1-33%)] [3:Small (1-33%)] [4:Small (1-33%)] Granulation Quality: [2:Pink, Pale] [3:Pink, Pale] [4:Pink, Pale] Necrotic Amount:  [2:Large (67-100%)] [3:Large (67-100%)] [4:Large (67-100%)] Exposed Structures: [2:Fat Layer (Subcutaneous Tissue) Exposed: Yes Fascia: No Tendon: No] [3:Fat Layer (Subcutaneous Tissue) Exposed: Yes Fascia: No Tendon: No] [4:Fat Layer (Subcutaneous Tissue) Exposed: Yes Fascia: No Tendon: No] Muscle: No Muscle: No Muscle: No Joint: No Joint: No Joint: No Bone: No Bone: No Bone: No Epithelialization: None None None Debridement: Debridement (40981- Debridement (19147- Debridement (11042- 11047) 11047) 11047) Pre-procedure 15:10 15:10 15:10 Verification/Time Out Taken: Pain Control: Lidocaine 4% Topical Lidocaine 4% Topical Lidocaine 4% Topical Solution Solution Solution Tissue Debrided: Fibrin/Slough, Fibrin/Slough, Fibrin/Slough, Subcutaneous Subcutaneous Subcutaneous Level: Skin/Subcutaneous Skin/Subcutaneous Skin/Subcutaneous Tissue Tissue Tissue Debridement Area (sq 8.14 2.55 52.25 cm): Instrument: Curette Curette Curette Bleeding: Minimum Minimum Minimum Hemostasis Achieved: Pressure Pressure Pressure Procedural Pain: 2 2 2  Post Procedural Pain: 2 2 2  Debridement Treatment Procedure was tolerated Procedure was tolerated Procedure was tolerated Response: well well well Post Debridement 3.7x2.2x0.2 1.7x1.5x0.1 9.5x5.5x0.1 Measurements L x W x D (cm) Post Debridement 1.279 0.2 4.104 Volume: (cm) Periwound Skin Texture: Excoriation: No Excoriation: No Excoriation: No Induration: No Induration: No Induration: No Callus: No Callus: No Callus: No Crepitus: No Crepitus: No Crepitus: No Rash: No Rash: No Rash: No Scarring: No Scarring: No Scarring: No Periwound Skin Maceration: No Maceration: No Maceration: No Moisture: Dry/Scaly: No Dry/Scaly: No Dry/Scaly: No Periwound Skin Color: Hemosiderin Staining: Yes Hemosiderin Staining: Yes Hemosiderin Staining: Yes Mottled: Yes Mottled: Yes Mottled: Yes Atrophie Blanche: No Atrophie Blanche: No Atrophie  Blanche: No Cyanosis: No Cyanosis: No Cyanosis: No Ecchymosis: No Ecchymosis: No Ecchymosis: No Erythema: No Erythema: No Erythema: No Pallor: No Pallor: No Pallor: No Rubor: No Rubor: No Rubor: No Temperature: No Abnormality No Abnormality No Abnormality Tenderness on Yes Yes Yes Palpation: Wound Preparation: Ulcer Cleansing: Other: Ulcer Cleansing: Other: Ulcer Cleansing: Other: surg scrub and water surg scrub and water surg scrub and water Gin, Rhylin J. ([id number]) Topical Anesthetic Topical Anesthetic Topical Anesthetic Applied: Other: lidocaine Applied: Other: lidocaine 4 Applied: Other: lidocaine 4% % 4% Procedures Performed: Debridement Debridement Debridement Treatment Notes Wound #2 (Right, Lateral Malleolus) 1. Cleansed with: Cleanse wound with antibacterial soap and water 4. Dressing Applied: Santyl Ointment 5. Secondary Dressing Applied ABD and Kerlix/Conform 7. Secured with Tape Wound #3 (Left, Medial Lower Leg) 1. Cleansed with: Cleanse wound with antibacterial soap and water 4. Dressing Applied: Santyl Ointment 5. Secondary Dressing Applied ABD and Kerlix/Conform 7. Secured with Tape Wound #4 (Right, Medial Lower Leg) 1. Cleansed with: Cleanse wound with antibacterial soap and water 4. Dressing Applied: Santyl Ointment 5. Secondary Dressing Applied ABD and Kerlix/Conform 7. Secured with Secretary/administrator) Signed: 03/10/2017 7:58:45 AM By: Baltazar Najjar MD Entered By: Baltazar Najjar on 03/09/2017 15:29:37 [name], [name] ([id number]) -------------------------------------------------------------------------------- Multi-Disciplinary Care Plan Details [name], PERRIS. Date of Service: 03/09/2017 2:45 PM Medical Record Patient Account Number: [id number] Number: Treating  RN: Clover Mealy, RN, BSN, Rita Oct 18, 1931 [phone] y.o. Other Clinician: Date of Birth/Sex: Female) Treating ROBSON, MICHAEL Primary Care  Peregrine Nolt: Guy Begin Shevaun Lovan/Extender: G Referring Laurina Fischl: [name] in Treatment: 4 Active Inactive ` Abuse / Safety / Falls / Self Care Management Nursing Diagnoses: Potential for falls Goals: Patient will remain injury free Date Initiated: 02/09/2017 Target Resolution Date: 04/17/2017 Goal Status: Active Interventions: Assess fall risk on admission and as needed Assess self care needs on admission and as needed Notes: ` Nutrition Nursing Diagnoses: Imbalanced nutrition Goals: Patient/caregiver agrees to and verbalizes understanding of need to use nutritional supplements and/or vitamins as prescribed Date Initiated: 02/09/2017 Target Resolution Date: 04/17/2017 Goal Status: Active Interventions: Assess patient nutrition upon admission and as needed per policy Notes: ` Orientation to the Wound Care Program [name], [name] ([id number]) Nursing Diagnoses: Knowledge deficit related to the wound healing center program Goals: Patient/caregiver will verbalize understanding of the Wound Healing Center Program Date Initiated: 02/09/2017 Target Resolution Date: 02/20/2017 Goal Status: Active Interventions: Provide education on orientation to the wound center Notes: ` Pain, Acute or Chronic Nursing Diagnoses: Pain, acute or chronic: actual or potential Potential alteration in comfort, pain Goals: Patient/caregiver will verbalize adequate pain control between visits Date Initiated: 02/09/2017 Target Resolution Date: 04/17/2017 Goal Status: Active Interventions: Assess comfort goal upon admission Complete pain assessment as per visit requirements Notes: ` Wound/Skin Impairment Nursing Diagnoses: Impaired tissue integrity Knowledge deficit related to smoking impact on wound healing Knowledge deficit related to ulceration/compromised skin integrity Goals: Ulcer/skin breakdown will have a volume reduction of 80% by week 12 Date Initiated: 02/09/2017 Target  Resolution Date: 04/10/2017 Goal Status: Active Interventions: Assess patient/caregiver ability to perform ulcer/skin care regimen upon admission and as needed [name], [name] ([id number]) Assess ulceration(s) every visit Notes: Electronic Signature(s) Signed: 03/09/2017 4:40:29 PM By: Elpidio Eric BSN, RN Entered By: Elpidio Eric on 03/09/2017 14:59:38 [name], [name] ([id number]) -------------------------------------------------------------------------------- Pain Assessment Details [name] Date of Service: 03/09/2017 2:45 PM Medical Record Patient Account Number: [id number] Number: Treating RN: Clover Mealy, RN, BSN, Rita [birth year] [phone] y.o. Other Clinician: Date of Birth/Sex: Female) Treating ROBSON, MICHAEL Primary Care Wally Behan: Guy Begin Rahma Meller/Extender: G Referring Henson Fraticelli: [name] in Treatment: 4 Active Problems Location of Pain Severity and Description of Pain Patient Has Paino Yes Site Locations Pain Location: Pain in Ulcers Rate the pain. Current Pain Level: 4 Character of Pain Describe the Pain: Tender Pain Management and Medication Current Pain Management: Electronic Signature(s) Signed: 03/09/2017 4:40:29 PM By: Elpidio Eric BSN, RN Entered By: Elpidio Eric on 03/09/2017 14:47:31 [name], [name] ([id number]) -------------------------------------------------------------------------------- Patient/Caregiver Education Details [name] Date of Service: 03/09/2017 2:45 PM Medical Record Patient Account Number: [id number] Number: Treating RN: Clover Mealy, RN, BSN, Rita July 17, 1931 [phone] y.o. Other Clinician: Date of Birth/Gender: Female) Treating ROBSON, MICHAEL Primary Care Physician: Guy Begin Physician/Extender: G Referring Physician: [name] in Treatment: 4 Education Assessment Education Provided To: Patient Education Topics Provided Welcome To The Wound Care  Center: Methods: Explain/Verbal Wound Debridement: Methods: Explain/Verbal Wound/Skin Impairment: Methods: Explain/Verbal Responses: State content correctly Electronic Signature(s) Signed: 03/09/2017 4:40:29 PM By: Elpidio Eric BSN, RN Entered By: Elpidio Eric on 03/09/2017 15:27:51 [name], [name] ([id number]) -------------------------------------------------------------------------------- Wound Assessment Details [name] Date of Service: 03/09/2017 2:45 PM Medical Record Patient Account Number: [id number] Number: Treating RN: Clover Mealy, RN, BSN, Rita [birth year] [phone] y.o. Other Clinician: Date of Birth/Sex: Female) Treating ROBSON, MICHAEL Primary Care Douglass Dunshee:  Guy Begin Mycah Mcdougall/Extender: G Referring Rolinda Impson: Guy Begin Weeks in Treatment: 4 Wound Status Wound Number: 2 Primary Venous Leg Ulcer Etiology: Wound Location: Right Malleolus - Lateral Wound Status: Open Wounding Event: Gradually Appeared Comorbid Cataracts, Arrhythmia, Hypertension, Date Acquired: 01/19/2017 History: Osteoarthritis Weeks Of Treatment: 4 Clustered Wound: No Photos Photo Uploaded By: Elpidio Eric on 03/09/2017 16:24:41 Wound Measurements Length: (cm) 3.7 Width: (cm) 2.2 Depth: (cm) 0.2 Area: (cm) 6.393 Volume: (cm) 1.279 % Reduction in Area: -54.5% % Reduction in Volume: -208.9% Epithelialization: None Tunneling: No Undermining: No Wound Description Classification: Partial Thickness Foul Odor Aft Wound Margin: Distinct, outline attached Slough/Fibrin Exudate Amount: Medium Exudate Type: Serosanguineous Exudate Color: red, brown er Cleansing: No o Yes Wound Bed Granulation Amount: Small (1-33%) Exposed Structure Granulation Quality: Pink, Pale Fascia Exposed: No Necrotic Amount: Large (67-100%) Fat Layer (Subcutaneous Tissue) Exposed: Yes Andrew, Deshawnda J. ([id number]) Necrotic Quality: Adherent Slough Tendon Exposed: No Muscle Exposed:  No Joint Exposed: No Bone Exposed: No Periwound Skin Texture Texture Color No Abnormalities Noted: No No Abnormalities Noted: No Callus: No Atrophie Blanche: No Crepitus: No Cyanosis: No Excoriation: No Ecchymosis: No Induration: No Erythema: No Rash: No Hemosiderin Staining: Yes Scarring: No Mottled: Yes Pallor: No Moisture Rubor: No No Abnormalities Noted: No Dry / Scaly: No Temperature / Pain Maceration: No Temperature: No Abnormality Tenderness on Palpation: Yes Wound Preparation Ulcer Cleansing: Other: surg scrub and water, Topical Anesthetic Applied: Other: lidocaine 4%, Treatment Notes Wound #2 (Right, Lateral Malleolus) 1. Cleansed with: Cleanse wound with antibacterial soap and water 4. Dressing Applied: Santyl Ointment 5. Secondary Dressing Applied ABD and Kerlix/Conform 7. Secured with Secretary/administrator) Signed: 03/09/2017 4:40:29 PM By: Elpidio Eric BSN, RN Entered By: Elpidio Eric on 03/09/2017 14:58:52 Tomaszewski, [name] ([id number]) -------------------------------------------------------------------------------- Wound Assessment Details [name] Date of Service: 03/09/2017 2:45 PM Medical Record Patient Account Number: [id number] Number: Treating RN: Clover Mealy, RN, BSN, Rita September 26, 1931 [phone] y.o. Other Clinician: Date of Birth/Sex: Female) Treating ROBSON, MICHAEL Primary Care Luie Laneve: Guy Begin Lynnmarie Lovett/Extender: G Referring Lashica Hannay: Guy Begin Weeks in Treatment: 4 Wound Status Wound Number: 3 Primary Venous Leg Ulcer Etiology: Wound Location: Left Lower Leg - Medial Wound Status: Open Wounding Event: Gradually Appeared Comorbid Cataracts, Arrhythmia, Hypertension, Date Acquired: 01/19/2017 History: Osteoarthritis Weeks Of Treatment: 4 Clustered Wound: No Photos Photo Uploaded By: Elpidio Eric on 03/09/2017 16:24:41 Wound Measurements Length: (cm) 1.7 Width: (cm) 1.5 Depth: (cm) 0.1 Area:  (cm) 2.003 Volume: (cm) 0.2 % Reduction in Area: -409.7% % Reduction in Volume: -412.8% Epithelialization: None Tunneling: No Undermining: No Wound Description Classification: Partial Thickness Foul Odor Aft Wound Margin: Distinct, outline attached Slough/Fibrin Exudate Amount: Medium Exudate Type: Serosanguineous Exudate Color: red, brown er Cleansing: No o Yes Wound Bed Granulation Amount: Small (1-33%) Exposed Structure Granulation Quality: Pink, Pale Fascia Exposed: No Necrotic Amount: Large (67-100%) Fat Layer (Subcutaneous Tissue) Exposed: Yes Maulden, Charliegh J. ([id number]) Necrotic Quality: Adherent Slough Tendon Exposed: No Muscle Exposed: No Joint Exposed: No Bone Exposed: No Periwound Skin Texture Texture Color No Abnormalities Noted: No No Abnormalities Noted: No Callus: No Atrophie Blanche: No Crepitus: No Cyanosis: No Excoriation: No Ecchymosis: No Induration: No Erythema: No Rash: No Hemosiderin Staining: Yes Scarring: No Mottled: Yes Pallor: No Moisture Rubor: No No Abnormalities Noted: No Dry / Scaly: No Temperature / Pain Maceration: No Temperature: No Abnormality Tenderness on Palpation: Yes Wound Preparation Ulcer Cleansing: Other: surg scrub and water, Topical Anesthetic Applied: Other: lidocaine 4 %, Treatment Notes Wound #3 (Left,  Medial Lower Leg) 1. Cleansed with: Cleanse wound with antibacterial soap and water 4. Dressing Applied: Santyl Ointment 5. Secondary Dressing Applied ABD and Kerlix/Conform 7. Secured with Secretary/administratorTape Electronic Signature(s) Signed: 03/09/2017 4:40:29 PM By: Elpidio EricAfful, Rita BSN, RN Entered By: Elpidio EricAfful, Rita on 03/09/2017 14:59:12 Obrecht, [name] ([id number]) -------------------------------------------------------------------------------- Wound Assessment Details Patient Name: Jordan BandaFULLER, Jasmeet J. Date of Service: 03/09/2017 2:45 PM Medical Record Patient Account Number: [id number] Number: Treating  RN: Clover MealyAfful, RN, BSN, Rita [birth year] [phone](83 y.o. Other Clinician: Date of Birth/Sex: Female) Treating ROBSON, MICHAEL Primary Care Ashten Sarnowski: [name], NARGES Xayvier Vallez/Extender: G Referring Anjoli Diemer: [name], NARGES Weeks in Treatment: 4 Wound Status Wound Number: 4 Primary Venous Leg Ulcer Etiology: Wound Location: Right Lower Leg - Medial Wound Status: Open Wounding Event: Gradually Appeared Comorbid Cataracts, Arrhythmia, Hypertension, Date Acquired: 02/16/2017 History: Osteoarthritis Weeks Of Treatment: 3 Clustered Wound: No Photos Photo Uploaded By: Elpidio EricAfful, Rita on 03/09/2017 16:25:25 Wound Measurements Length: (cm) 9.5 Width: (cm) 5.5 Depth: (cm) 0.1 Area: (cm) 41.037 Volume: (cm) 4.104 % Reduction in Area: -26038.2% % Reduction in Volume: -25550% Epithelialization: None Tunneling: No Undermining: No Wound Description Classification: Partial Thickness Wound Margin: Distinct, outline attached Exudate Amount: Large Bonine, Aleja J. ([id number]) Foul Odor After Cleansing: No Slough/Fibrino Yes Exudate Type: Serosanguineous Exudate Color: red, brown Wound Bed Granulation Amount: Small (1-33%) Exposed Structure Granulation Quality: Pink, Pale Fascia Exposed: No Necrotic Amount: Large (67-100%) Fat Layer (Subcutaneous Tissue) Exposed: Yes Necrotic Quality: Adherent Slough Tendon Exposed: No Muscle Exposed: No Joint Exposed: No Bone Exposed: No Periwound Skin Texture Texture Color No Abnormalities Noted: No No Abnormalities Noted: No Callus: No Atrophie Blanche: No Crepitus: No Cyanosis: No Excoriation: No Ecchymosis: No Induration: No Erythema: No Rash: No Hemosiderin Staining: Yes Scarring: No Mottled: Yes Pallor: No Moisture Rubor: No No Abnormalities Noted: No Dry / Scaly: No Temperature / Pain Maceration: No Temperature: No Abnormality Tenderness on Palpation: Yes Wound Preparation Ulcer Cleansing: Other: surg scrub and water, Topical  Anesthetic Applied: Other: lidocaine 4%, Treatment Notes Wound #4 (Right, Medial Lower Leg) 1. Cleansed with: Cleanse wound with antibacterial soap and water 4. Dressing Applied: Santyl Ointment 5. Secondary Dressing Applied ABD and Kerlix/Conform 7. Secured with Secretary/administratorTape Electronic Signature(s) Signed: 03/09/2017 4:40:29 PM By: Elpidio EricAfful, Rita BSN, RN Entered By: Elpidio EricAfful, Rita on 03/09/2017 14:59:25 Jordan BandaFULLER, Zykera J. ([id number]) Weatherspoon, [name] ([id number]) -------------------------------------------------------------------------------- Vitals Details Patient Name: Jordan BandaFULLER, Idella J. Date of Service: 03/09/2017 2:45 PM Medical Record Patient Account Number: [id number] Number: Treating RN: Clover MealyAfful, RN, BSN, Rita [birth year] [phone](83 y.o. Other Clinician: Date of Birth/Sex: Female) Treating ROBSON, MICHAEL Primary Care Shamell Suarez: [name], NARGES Shaneca Orne/Extender: G Referring Kindall Swaby: [name], NARGES Weeks in Treatment: 4 Vital Signs Time Taken: 14:48 Temperature (F): 97.4 Height (in): 66 Pulse (bpm): 80 Weight (lbs): 159 Respiratory Rate (breaths/min): 16 Body Mass Index (BMI): 25.7 Blood Pressure (mmHg): 138/74 Reference Range: 80 - 120 mg / dl Electronic Signature(s) Signed: 03/09/2017 4:40:29 PM By: Elpidio EricAfful, Rita BSN, RN Entered By: Elpidio EricAfful, Rita on 03/09/2017 14:49:20

## 2017-03-11 ENCOUNTER — Other Ambulatory Visit
Admission: RE | Admit: 2017-03-11 | Discharge: 2017-03-11 | Disposition: A | Discharge status: Home / Self Care | Payer: Medicare Other | Source: Ambulatory Visit | Attending: Cardiovascular Disease | Admitting: Cardiovascular Disease

## 2017-03-11 ENCOUNTER — Encounter: Payer: Self-pay | Admitting: Cardiovascular Disease

## 2017-03-11 ENCOUNTER — Ambulatory Visit (INDEPENDENT_AMBULATORY_CARE_PROVIDER_SITE_OTHER): Payer: Medicare Other | Admitting: Cardiovascular Disease

## 2017-03-11 VITALS — BP 142/68 | HR 70 | Ht 65.0 in | Wt 138.0 lb

## 2017-03-11 DIAGNOSIS — I10 Essential (primary) hypertension: Secondary | ICD-10-CM | POA: Diagnosis not present

## 2017-03-11 DIAGNOSIS — Z01812 Encounter for preprocedural laboratory examination: Secondary | ICD-10-CM | POA: Insufficient documentation

## 2017-03-11 DIAGNOSIS — I739 Peripheral vascular disease, unspecified: Secondary | ICD-10-CM | POA: Diagnosis not present

## 2017-03-11 DIAGNOSIS — Z01818 Encounter for other preprocedural examination: Secondary | ICD-10-CM

## 2017-03-11 DIAGNOSIS — A419 Sepsis, unspecified organism: Secondary | ICD-10-CM | POA: Insufficient documentation

## 2017-03-11 DIAGNOSIS — R42 Dizziness and giddiness: Secondary | ICD-10-CM | POA: Insufficient documentation

## 2017-03-11 LAB — CBC
HEMATOCRIT: 36.2 % (ref 35.0–47.0)
HEMOGLOBIN: 12.9 g/dL (ref 12.0–16.0)
MCH: 32.4 pg (ref 26.0–34.0)
MCHC: 35.6 g/dL (ref 32.0–36.0)
MCV: 91.2 fL (ref 80.0–100.0)
Platelets: 189 10*3/uL (ref 150–440)
RBC: 3.97 MIL/uL (ref 3.80–5.20)
RDW: 14.2 % (ref 11.5–14.5)
WBC: 11.6 10*3/uL — ABNORMAL HIGH (ref 3.6–11.0)

## 2017-03-11 LAB — BASIC METABOLIC PANEL
Anion gap: 13 (ref 5–15)
BUN: 33 mg/dL — AB (ref 6–20)
CHLORIDE: 92 mmol/L — AB (ref 101–111)
CO2: 25 mmol/L (ref 22–32)
CREATININE: 1.39 mg/dL — AB (ref 0.44–1.00)
Calcium: 10.4 mg/dL — ABNORMAL HIGH (ref 8.9–10.3)
GFR calc non Af Amer: 34 mL/min — ABNORMAL LOW (ref >60)
GFR, EST AFRICAN AMERICAN: 39 mL/min — AB (ref >60)
GLUCOSE: 78 mg/dL (ref 65–99)
Potassium: 3.2 mmol/L — ABNORMAL LOW (ref 3.5–5.1)
Sodium: 130 mmol/L — ABNORMAL LOW (ref 135–145)

## 2017-03-11 LAB — PROTIME-INR
INR: 1.11
Prothrombin Time: 14.4 seconds (ref 11.4–15.2)

## 2017-03-11 MED ORDER — CLOPIDOGREL BISULFATE 75 MG PO TABS: 75.0000 mg | ORAL_TABLET | Freq: Every day | ORAL | 3 refills | Status: DC

## 2017-03-11 NOTE — Patient Instructions (Addendum)
Medication Instructions:  Your physician has recommended you make the following change in your medication:  START taking Plavix 75mg  once daily   Labwork: BMET, CBC, [facility]  Testing/Procedures: [facility]  Wednesday, April 11 Arrival time 8:30am [address] Short Stay, [place]  [phone]  Nothing to eat or drink after midnight the evening before your labs. You may take your morning medications with a sip of water.  Hold maxide the morning of your procedure  Follow-Up: Your physician recommends that you schedule a follow-up appointment in: 1 month with Dr. Kirke CorinArida.    Any Other Special Instructions Will Be Listed Below (If Applicable).     If you need a refill on your cardiac medications before your next appointment, please call your pharmacy.

## 2017-03-11 NOTE — Progress Notes (Signed)
Cardiology Office Note   Date:  03/11/2017   ID:  [name], DOB [birth year], MRN [id number]  PCP:  Guy Begin, MD  Cardiologist:   Lorine Bears, MD   Chief Complaint  Patient presents with  . other    1 month follow up post abdominal aortogram with lower extremity angiography. Pt. c/o bilateral ankle sores with legs burning.       History of Present Illness: [name] is a 83 y.o. female who is here today for a follow-up visit regarding peripheral arterial disease. The patient has no history of diabetes or tobacco use. She is known to have hypertension. She is known to have peripheral arterial disease with significantly abnormal ABI. The patient was seen recently for recurrent ulceration on both legs.  I proceeded with angiography recently which showed: 1. No significant aortoiliac disease. 2. Right lower extremity: Mild diffuse SFA and popliteal disease with significant stenosis affecting the TP trunk just before the origin of the peroneal artery which is the only patent vessel below the knee. 3. Left lower extremity: Moderate left SFA stenosis with occluded popliteal artery and tibial peroneal vessels with only visible collaterals and short segments of reconstitution.  No revascularization options of the left lower extremity. She has no targets for bypass and no options for endovascular intervention given that there is no reconstitution of her tibial peroneal vessels.  The left lower extremity wound appears to be small and might be improving. However, the wound on the right lower extremity showed no improvement at all.   Past Medical History:  Diagnosis Date  . Dizziness   . Hypertension     Past Surgical History:  Procedure Laterality Date  . ABDOMINAL AORTOGRAM N/A 02/24/2017   Procedure: Abdominal Aortogram;  Surgeon: Iran Ouch, MD;  Location: [facility] INVASIVE [facility];  Service: Cardiovascular;  Laterality: N/A;  . HERNIA REPAIR    . JOINT REPLACEMENT      left knee  . LOWER EXTREMITY ANGIOGRAPHY Bilateral 02/24/2017   Procedure: Lower Extremity Angiography;  Surgeon: Iran Ouch, MD;  Location: [facility] INVASIVE CV LAB;  Service: Cardiovascular;  Laterality: Bilateral;     Current Outpatient Prescriptions  Medication Sig Dispense Refill  . acetaminophen (TYLENOL) 500 MG tablet Take 1,000 mg by mouth 2 (two) times daily as needed for moderate pain or headache.    Marland Kitchen amLODipine (NORVASC) 10 MG tablet Take 10 mg by mouth daily.   5  . aspirin EC 81 MG tablet Take 81 mg by mouth daily.    . Biotin 1000 MCG tablet Take 1,000 mcg by mouth every evening.    . Calcium Carbonate-Vitamin D (CALCIUM 600+D PO) Take 1 tablet by mouth every evening.    . cholecalciferol (VITAMIN D) 1000 units tablet Take 1,000 Units by mouth every evening.     . labetalol (NORMODYNE) 100 MG tablet Take 100 mg by mouth 2 (two) times daily.   5  . traMADol (ULTRAM) 50 MG tablet Take 50 mg by mouth 2 (two) times daily as needed for moderate pain.    Marland Kitchen triamterene-hydrochlorothiazide (MAXZIDE-25) 37.5-25 MG per tablet Take 1 tablet by mouth daily.  5  . clopidogrel (PLAVIX) 75 MG tablet Take 1 tablet (75 mg total) by mouth daily. 30 tablet 3   No current facility-administered medications for this visit.     Allergies:   Sulfa antibiotics    Social History:  The patient  reports that she has never smoked. She has  never used smokeless tobacco. She reports that she does not drink alcohol or use drugs.   Family History:  The patient's Family history is negative for coronary artery disease.   ROS:  Please see the history of present illness.   Otherwise, review of systems are positive for none.   All other systems are reviewed and negative.    PHYSICAL EXAM: VS:  BP (!) 142/68 (BP Location: Right Arm, Patient Position: Sitting, Cuff Size: Normal)   Pulse 70   Ht 5\' 5"  (1.651 m)   Wt 138 lb (62.6 kg)   BMI 22.96 kg/m  , BMI Body mass index is 22.96 kg/m. GEN: Well  nourished, well developed, in no acute distress  HEENT: normal  Neck: no JVD, carotid bruits, or masses Cardiac: RRR; no murmurs, rubs, or gallops,no edema  Respiratory:  clear to auscultation bilaterally, normal work of breathing GI: soft, nontender, nondistended, + BS MS: no deformity or atrophy  Skin: warm and dry, no rash Neuro:  Strength and sensation are intact Psych: euthymic mood, full affect The wounds were examined today. there is a 3 x 2 cm wound on the right lateral malleolus. This wound is deep. There is a 1 x 1 cm wound on the left medial lower leg. This wound is superficial. There is also a large superficial on the right medial ankle  EKG:  EKG is ordered today. EKG showed normal sinus rhythm with first-degree AV block. Nonspecific ST and T wave changes.   Recent Labs: 06/28/2016: ALT 17 06/30/2016: Magnesium 1.8 02/19/2017: BUN 20; Creatinine, Ser 1.03; Hemoglobin 12.5; Platelets 175; Potassium 3.6; Sodium 137    Lipid Panel No results found for: CHOL, TRIG, HDL, CHOLHDL, VLDL, LDLCALC, LDLDIRECT    Wt Readings from Last 3 Encounters:  03/11/17 138 lb (62.6 kg)  02/24/17 143 lb (64.9 kg)  02/19/17 143 lb 8 oz (65.1 kg)         ASSESSMENT AND PLAN:  1. Peripheral arterial disease: The patient has critical limb ischemia with significant disease below the knee bilaterally.  I reviewed the angiogram and details today with the patient and family. I explained to them that there are no revascularization options to the left lower extremity and I already discussed the case with Dr. Randie Heinzain for a second opinion. The wound in the left lower extremity is actually small and hopefully it can improve with conservative measures. If she develops further ischemic complications on that side, she might require amputation.  The wounds on the right leg are more concerning and much deeper with no significant improvement. She does have significant disease in the TP trunk into the peroneal  artery which is patent to the foot and gives collaterals to the pedal loop. Due to that, I recommend receding with endovascular intervention on the right lower extremity using orbital atherectomy and angioplasty. I discussed the procedure in details as well as risks and benefits. I'm going to start Plavix 75 mg once daily.  2. Essential hypertension: Blood pressure is reasonably controlled.  Disposition:   FU with me in 1 month.  Signed,  Lorine BearsMuhammad Louiza Moor, MD  03/11/2017 4:40 PM    [facility]

## 2017-03-12 ENCOUNTER — Other Ambulatory Visit: Payer: Self-pay

## 2017-03-12 DIAGNOSIS — E860 Dehydration: Secondary | ICD-10-CM

## 2017-03-12 MED ORDER — POTASSIUM CHLORIDE CRYS ER 20 MEQ PO TBCR: 20.0000 meq | EXTENDED_RELEASE_TABLET | Freq: Every day | ORAL | 6 refills | Status: DC

## 2017-03-15 ENCOUNTER — Telehealth: Payer: Self-pay | Admitting: Cardiovascular Disease

## 2017-03-15 NOTE — Telephone Encounter (Signed)
Patient wants to know how long to take plavix and K+.  Is this only for a week. Patient wants to know if she should go to hospital to have pre procedure labs done.  Please call

## 2017-03-15 NOTE — Telephone Encounter (Signed)
No answer. Left message to call back on Julene's number, ok per DPR.

## 2017-03-15 NOTE — Telephone Encounter (Signed)
Received incoming call from patient's daughter to clarify instructions of Plavix, potassium and lab work. She verbalized understanding to continue Plavix up to the procedure and then she would receive further instructions on it, to take potassium for 1 week, and then repeat BMP 2 days before procedure on 03/22/17 at the [facility].

## 2017-03-16 ENCOUNTER — Encounter: Payer: Medicare Other | Attending: Nurse Practitioner | Admitting: Nurse Practitioner

## 2017-03-16 DIAGNOSIS — I10 Essential (primary) hypertension: Secondary | ICD-10-CM | POA: Insufficient documentation

## 2017-03-16 DIAGNOSIS — L97221 Non-pressure chronic ulcer of left calf limited to breakdown of skin: Secondary | ICD-10-CM | POA: Insufficient documentation

## 2017-03-16 DIAGNOSIS — L97313 Non-pressure chronic ulcer of right ankle with necrosis of muscle: Secondary | ICD-10-CM | POA: Insufficient documentation

## 2017-03-16 DIAGNOSIS — I70243 Atherosclerosis of native arteries of left leg with ulceration of ankle: Secondary | ICD-10-CM | POA: Diagnosis not present

## 2017-03-16 DIAGNOSIS — I70233 Atherosclerosis of native arteries of right leg with ulceration of ankle: Secondary | ICD-10-CM | POA: Insufficient documentation

## 2017-03-16 DIAGNOSIS — I87333 Chronic venous hypertension (idiopathic) with ulcer and inflammation of bilateral lower extremity: Secondary | ICD-10-CM | POA: Insufficient documentation

## 2017-03-17 NOTE — Progress Notes (Addendum)
SELA, FALK ([id number]) Visit Report for 03/16/2017 Chief Complaint Document Details [name], [name]. Date of Service: 03/16/2017 3:45 PM Medical Record Number: [id number] Patient Account Number: [id number] Date of Birth/Sex: [birth year] (83 y.o. Female) Treating RN: Huel Coventry Primary Care Provider: Guy Begin Other Clinician: Referring Provider: Guy Begin Treating Provider/Extender: Kathreen Cosier in Treatment: 5 Information Obtained from: Patient Chief Complaint the patient is here for follow-up evaluation of her bilateral lower extremity ulcers Electronic Signature(s) Signed: 03/16/2017 4:50:16 PM By: Bonnell Public Entered By: Bonnell Public on 03/16/2017 16:50:16 Sear, [name] ([id number]) -------------------------------------------------------------------------------- Debridement Details [name] Date of Service: 03/16/2017 3:45 PM Medical Record Number: [id number] Patient Account Number: [id number] Date of Birth/Sex: 29-Jan-1931 (83 y.o. Female) Treating RN: Huel Coventry Primary Care Provider: Guy Begin Other Clinician: Referring Provider: Guy Begin Treating Provider/Extender: Kathreen Cosier in Treatment: 5 Debridement Performed for Wound #2 Right,Lateral Malleolus Assessment: Performed By: Physician Bonnell Public, NP Debridement: Chemical/enzymatic Debridement Non-Selective Description: Pre-procedure No Verification/Time Out Taken: Procedural Pain: 0 Post Procedural Pain: 0 Response to Treatment: Procedure was tolerated well Post Debridement Measurements of Total Wound Length: (cm) 2.8 Width: (cm) 2 Depth: (cm) 0.2 Volume: (cm) 0.88 Character of Wound/Ulcer Post Requires Further Debridement Debridement: Severity of Tissue Post Debridement: Fat layer exposed Post Procedure Diagnosis Same as Pre-procedure Electronic Signature(s) Signed: 03/17/2017 1:12:50 PM By: Elliot Gurney, RN, BSN, Kim RN, BSN Signed:  03/17/2017 4:22:06 PM By: Bonnell Public Entered By: Elliot Gurney RN, BSN, Kim on 03/17/2017 12:57:09 Younce, [name] ([id number]) -------------------------------------------------------------------------------- Debridement Details [name] Date of Service: 03/16/2017 3:45 PM Medical Record Number: [id number] Patient Account Number: [id number] Date of Birth/Sex: [birth year] (83 y.o. Female) Treating RN: Huel Coventry Primary Care Provider: Guy Begin Other Clinician: Referring Provider: Guy Begin Treating Provider/Extender: Kathreen Cosier in Treatment: 5 Debridement Performed for Wound #3 Left,Medial Lower Leg Assessment: Performed By: Physician Bonnell Public, NP Debridement: Chemical/enzymatic Debridement Non-Selective Description: Pre-procedure No Verification/Time Out Taken: Procedural Pain: 0 Post Procedural Pain: 0 Response to Treatment: Procedure was tolerated well Post Debridement Measurements of Total Wound Length: (cm) 1.8 Width: (cm) 1.5 Depth: (cm) 0.2 Volume: (cm) 0.424 Character of Wound/Ulcer Post Requires Further Debridement Debridement: Severity of Tissue Post Debridement: Fat layer exposed Post Procedure Diagnosis Same as Pre-procedure Electronic Signature(s) Signed: 03/17/2017 1:12:50 PM By: Elliot Gurney, RN, BSN, Kim RN, BSN Signed: 03/17/2017 4:22:06 PM By: Bonnell Public Entered By: Elliot Gurney RN, BSN, Kim on 03/17/2017 12:57:45 Degollado, [name] ([id number]) -------------------------------------------------------------------------------- Debridement Details [name] Date of Service: 03/16/2017 3:45 PM Medical Record Number: [id number] Patient Account Number: [id number] Date of Birth/Sex: 1931-07-31 (83 y.o. Female) Treating RN: Huel Coventry Primary Care Provider: Guy Begin Other Clinician: Referring Provider: Guy Begin Treating Provider/Extender: Kathreen Cosier in Treatment: 5 Debridement Performed for  Wound #4 Right,Medial Lower Leg Assessment: Performed By: Physician Bonnell Public, NP Debridement: Chemical/enzymatic Debridement Non-Selective Description: Pre-procedure No Verification/Time Out Taken: Procedural Pain: 0 Post Procedural Pain: 0 Response to Treatment: Procedure was tolerated well Post Debridement Measurements of Total Wound Length: (cm) 7.5 Width: (cm) 5.2 Depth: (cm) 0.1 Volume: (cm) 3.063 Character of Wound/Ulcer Post Requires Further Debridement Debridement: Severity of Tissue Post Debridement: Fat layer exposed Post Procedure Diagnosis Same as Pre-procedure Electronic Signature(s) Signed: 03/17/2017 1:12:50 PM By: Elliot Gurney, RN, BSN, Kim RN, BSN Signed: 03/17/2017 4:22:06 PM By: Bonnell Public Entered By: Elliot Gurney RN, BSN, Kim on 03/17/2017 12:58:15 Gautreau, [name] ([id number]) -------------------------------------------------------------------------------- HPI Details Patient Name: Jordan Lame  Hopkins. Date of Service: 03/16/2017 3:45 PM Medical Record Number: [id number] Patient Account Number: [id number] Date of Birth/Sex: 1931/05/17 (83 y.o. Female) Treating RN: Huel Coventry Primary Care Provider: Guy Begin Other Clinician: Referring Provider: Guy Begin Treating Provider/Extender: Kathreen Cosier in Treatment: 5 History of Present Illness HPI Description: 06/10/16; this is an 83 year old woman who lives near [place] with family members. She saw her primary doctor roughly over a week ago and was referred to the ER for a cellulitis in the right medial leg. She was given a round of IV antibiotics in the emergency room and discharged on oral Keflex which she is taking. She is been left with a uncomfortable leg area on the right medial lower leg. Her ABIs in this clinic were 0.8 on the right 0.6 on the left. She does not describe claudication. She is not a diabetic. Lab work in the ER showed a normal BUN and creatinine on 6/20 at 18 and 0.92 respectively  white count was 10.1 hemoglobin at 13.8. She did not have any imaging studies. I can see no vascular evaluations in Epic. She does not have a prior wound history 06/17/16; the area affected on her right medial ankle is an area I thought was due to tissue damage from cellulitis last week. She is going for venous reflux studies this afternoon, these were not ordered in this clinic at least not by me. The patient does not give a prior history of damage to the skin in this area although I wonder if she actually might not of noticed it. We have been using's Aquacel Ag under a wrap although we will not be able to wrap her today 06/24/16: pt reports today she hasn't heard from the vascular clinic regarding proceeding with arterial studies. our office will assist with this today. she denies s/s of infection. denies problems with her wraps. 07/07/16; since I last saw this woman she was admitted to hospital from 7/16 through 7/18 with sepsis syndrome felt to be secondary to her ulcer. She was treated with Vanco and Zosyn the hospital discharged on Septra. Blood cultures were negative. Urine culture showed multiple species she is now feeling well using Santyl with border foam to her wound. [facility] 07/14/16; no major improvement here. She has a quarter-sized wound covered with thick adherent slough on the lateral aspect of her right leg with several surrounding satellite lesions in a similar state. She has had 2 recent bouts of cellulitis, one before she came to this clinic and one required a recent hospitalization. I have asked for arterial studies I don't believe these have ever been done. My notes suggest from early in July that she went for reflux studies although I don't know that I have ever seen these either, she did have a DVT rule out but I did not order this, this did not show a DVT but did show a Baker's cyst 07/21/2016 -- the patient had an arterial duplex study done with waveform  suggestive of right femoropopliteal disease. There is also small vessel disease bilaterally. Right ABI was in the normal range of 1.0 and a TBI was 0.74. Left ABI is abnormal at 0.69 with a TBI of 0.40. Three-vessel runoff on the right and a two-vessel runoff on the left. She has an appointment to see Dr. Kirke Corin later today. 07/28/2016 -- was seen by Dr. Kirke Corin who reviewed her arterial studies with an ABI of 0.94 on the right and 0.69 on the left with normal  right toe brachial index. Duplex showed diffuse atherosclerosis without discrete stenosis and there was a three-vessel runoff on the right below the knee. Based on these studies and the location of the ulcer he thought it was venous commended she continue with wound care consults. NAMINE, BEAHM ([id number]) 08/04/16; I note the patient's reasonably functional arterial supply which is fortunate. The wound bed appears to be smaller. Debrided of surface slough. She is been using Iodoflex 08/11/16: Only a very small open area remains. However it has some depth 08/18/16; small open area is smaller. Still open however. 08/25/16; the area has totally closed down and epithelialized. READMISSION 02/09/17; this is an 83 year old woman who is not a diabetic or smoker. She was in our clinic in the summer into mid-September with a wound on her right medial calf that. This eventually closed down. I thought this was mostly venous. She did have evidence of PAD and she was seen by Dr. Kirke Corin. At that point she had an ABI of 0.94 on the right 0.69 on the left with a normal right toe brachial index. It was felt that she had enough blood flow to close her wound and that happened. She was discharged with stockings although I don't get the sense that she really use them she tells me that roughly a week or 2 she developed swelling in her bilateral lower legs and then noted wounds on her right lateral malleolus and left medial calf. The on this she is not really sure  how this happened. She is not having a lot of pain although she is not ambulatory that much only in her home. Other than weight loss she has not noticed any other issues no specific claudication no chest pain. 02/16/17; the patient continues with 2 wounds on the right lateral malleolus and left medial calf. Both of these are painful. She has chronic venous insufficiency but also known PAD and is previously seen Dr. Kirke Corin. We have arranged vascular follow-up with Dr. Kirke Corin to look at the arterial situation 02/25/2017 -- patient was brought in today because she missed her appointment last week with Dr. Leanord Hawking and is having a lot of pain and drainage from her wounds. The interim she was seen by Dr. Bascom Levels who did a duplex of the right lower extremity which shows a right ABI of 0.95 and left ABI 0.74. There was also a abdominal aortogram with a bilateral extremity angiography done for significant peripheral vascular disease. The right lower extremity showed diffuse SFA and popliteal disease with significant stenosis affecting the TP trunk just before the origin of the peritoneal artery. The left lower extremity showed moderate left SFA stenosis with occluded popliteal artery and tibioperoneal vessels with only visible collateral. The opinion was the patient had no endovascular surgical revascularization options of the left lower extremity and Dr. Bascom Levels would discuss it with Dr. Randie Heinz for a second opinion. Endovascular intervention on the right TP trunk could be considered. 03/03/17; the patient arrives back today having last seen Dr. Meyer Russel. As noted she has severe PAD which is not amenable to either bypass surgery or endovascular interventions on the left. Endovascular interventions in the right TP trunk could be considered. She has large wounds on the right medial leg and a sizable wound on the right lateral leg. Small wound on the left medial leg. She is really in a lot of discomfort. Using Santyl on  the right and I believe Prisma on the left. 03/10/17; the patient arrives today again with large  wounds on the right lateral lower extremity, right medial lower extremity. And a small punched out wound on the left medial lower extremity. These are in the setting of severe non-revascularizable PAD. She continues to complain of discomfort although I don't think right now this is unmanageable. We have been using Santyl. We are arranging getting [facility]] 03/16/17- patient is here for follow-up evaluation of her bilateral lower extremity ulcers. She has an appointment on 4/11 for an arthrectomy per Dr. Kirke Corin at [facility]. She is continuing to [place]. Her son is doing her dressing changes Electronic Signature(s) Signed: 03/16/2017 4:56:41 PM By: Teofilo Pod, SOTIRIA KEAST ([id number]) Entered By: Bonnell Public on 03/16/2017 16:56:40 LINZEE, DEPAUL ([id number]) -------------------------------------------------------------------------------- Physical Exam Details Patient Name: ASIANNA, BRUNDAGE. Date of Service: 03/16/2017 3:45 PM Medical Record Number: [id number] Patient Account Number: [id number] Date of Birth/Sex: 07-09-1931 (83 y.o. Female) Treating RN: Huel Coventry Primary Care Provider: Guy Begin Other Clinician: Referring Provider: Guy Begin Treating Provider/Extender: Kathreen Cosier in Treatment: 5 Constitutional BP within normal limits. afebrile. well nourished; well developed; appears stated age;Marland Kitchen Cardiovascular BLE- non-palpable DP and PT. Psychiatric calm, pleasant, conversive. Electronic Signature(s) Signed: 03/16/2017 4:57:10 PM By: Bonnell Public Entered By: Bonnell Public on 03/16/2017 16:57:10 [place], [name] ([id number]) -------------------------------------------------------------------------------- Physician Orders Details [name] Date of Service: 03/16/2017 3:45 PM Medical Record Number: [id number] Patient Account  Number: [id number] Date of Birth/Sex: 02-Dec-1931 (83 y.o. Female) Treating RN: Huel Coventry Primary Care Provider: Guy Begin Other Clinician: Referring Provider: Guy Begin Treating Provider/Extender: Kathreen Cosier in Treatment: 5 Verbal / Phone Orders: No Diagnosis Coding Wound Cleansing Wound #2 Right,Lateral Malleolus o Cleanse wound with mild soap and water Wound #3 Left,Medial Lower Leg o Cleanse wound with mild soap and water Wound #4 Right,Medial Lower Leg o Cleanse wound with mild soap and water Anesthetic Wound #2 Right,Lateral Malleolus o Topical Lidocaine 4% cream applied to wound bed prior to debridement - clinic use only Wound #3 Left,Medial Lower Leg o Topical Lidocaine 4% cream applied to wound bed prior to debridement - clinic use only Wound #4 Right,Medial Lower Leg o Topical Lidocaine 4% cream applied to wound bed prior to debridement - clinic use only Primary Wound Dressing Wound #2 Right,Lateral Malleolus o Santyl Ointment Wound #3 Left,Medial Lower Leg o Santyl Ointment Wound #4 Right,Medial Lower Leg o Santyl Ointment Secondary Dressing Wound #2 Right,Lateral Malleolus o Conform/Kerlix o Non-adherent pad o Other - Ace Wrap lightly to Wound #3 Left,Medial Lower Leg Ackerman, Laryah Hopkins. ([id number]) o Conform/Kerlix o Non-adherent pad o Other - Ace Wrap lightly to Wound #4 Right,Medial Lower Leg o Conform/Kerlix o Non-adherent pad o Other - Ace Wrap lightly to Dressing Change Frequency Wound #2 Right,Lateral Malleolus o Change dressing every day. - HHRN twice weekly. May teach available family member wound care for additional days. Wound #3 Left,Medial Lower Leg o Change dressing every day. - HHRN twice weekly. May teach available family member wound care for additional days. Wound #4 Right,Medial Lower Leg o Change dressing every day. - HHRN twice weekly. May teach available family member wound  care for additional days. Follow-up Appointments Wound #2 Right,Lateral Malleolus o Return Appointment in 1 week. Wound #3 Left,Medial Lower Leg o Return Appointment in 1 week. Wound #4 Right,Medial Lower Leg o Return Appointment in 1 week. Edema Control Wound #2 Right,Lateral Malleolus o Elevate legs to the level of the heart and pump ankles as often as possible Wound #3  Left,Medial Lower Leg o Elevate legs to the level of the heart and pump ankles as often as possible Wound #4 Right,Medial Lower Leg o Elevate legs to the level of the heart and pump ankles as often as possible Additional Orders / Instructions Wound #2 Right,Lateral Malleolus o Increase protein intake. Wound #3 Left,Medial Lower Leg o Increase protein intake. TANNAH, DREYFUSS ([id number]) Wound #4 Right,Medial Lower Leg o Increase protein intake. Home Health Wound #2 Right,Lateral Malleolus o Initiate Home Health for [facility] - [facility] o Continue Home Health Visits - Twice weekly. Patient comes to wound care center on Tuesdays. o Home Health Nurse may visit PRN to address patientos wound care needs. o FACE TO FACE ENCOUNTER: MEDICARE and MEDICAID PATIENTS: I certify that this patient is under my care and that I had a face-to-face encounter that meets the physician face-to-face encounter requirements with this patient on this date. The encounter with the patient was in whole or in part for the following MEDICAL CONDITION: (primary reason for Home Healthcare) MEDICAL NECESSITY: I certify, that based on my findings, NURSING services are a medically necessary home health service. HOME BOUND STATUS: I certify that my clinical findings support that this patient is homebound (i.e., Due to illness or injury, pt requires aid of supportive devices such as crutches, cane, wheelchairs, walkers, the use of special transportation or the assistance of another person to leave their  place of residence. There is a normal inability to leave the home and doing so requires considerable and taxing effort. Other absences are for medical reasons / religious services and are infrequent or of short duration when for other reasons). o If current dressing causes regression in wound condition, may D/C ordered dressing product/s and apply Normal Saline Moist Dressing daily until next Wound Healing Center / Other MD appointment. Notify Wound Healing Center of regression in wound condition at [phone]. o Please direct any NON-WOUND related issues/requests for orders to patient's Primary Care Physician Wound #3 Left,Medial Lower Leg o Initiate Home [facility] o Continue Home Health Visits - Twice weekly. Patient comes to wound care center on Tuesdays. o Home Health Nurse may visit PRN to address patientos wound care needs. o FACE TO FACE ENCOUNTER: MEDICARE and MEDICAID PATIENTS: I certify that this patient is under my care and that I had a face-to-face encounter that meets the physician face-to-face encounter requirements with this patient on this date. The encounter with the patient was in whole or in part for the following MEDICAL CONDITION: (primary reason for Home Healthcare) MEDICAL NECESSITY: I certify, that based on my findings, NURSING services are a medically necessary home health service. HOME BOUND STATUS: I certify that my clinical findings support that this patient is homebound (i.e., Due to illness or injury, pt requires aid of supportive devices such as crutches, cane, wheelchairs, walkers, the use of special transportation or the assistance of another person to leave their place of residence. There is a normal inability to leave the home and doing so requires considerable and taxing effort. Other absences are for medical reasons / religious services and are infrequent or of short duration when for other  reasons). o If current dressing causes regression in wound condition, may D/C ordered dressing product/s and apply Normal Saline Moist Dressing daily until next Wound Healing Center / Other MD appointment. Notify Wound Healing Center of regression in wound condition at [phone]. o Please direct any NON-WOUND related issues/requests for orders  to patient's Primary Care Physician Wound #4 Right,Medial Lower Leg MIESHIA, PEPITONE ([id number]) o Initiate [facility] o Continue Home Health Visits - Twice weekly. Patient comes to wound care center on Tuesdays. o Home Health Nurse may visit PRN to address patientos wound care needs. o FACE TO FACE ENCOUNTER: MEDICARE and MEDICAID PATIENTS: I certify that this patient is under my care and that I had a face-to-face encounter that meets the physician face-to-face encounter requirements with this patient on this date. The encounter with the patient was in whole or in part for the following MEDICAL CONDITION: (primary reason for Home Healthcare) MEDICAL NECESSITY: I certify, that based on my findings, NURSING services are a medically necessary home health service. HOME BOUND STATUS: I certify that my clinical findings support that this patient is homebound (i.e., Due to illness or injury, pt requires aid of supportive devices such as crutches, cane, wheelchairs, walkers, the use of special transportation or the assistance of another person to leave their place of residence. There is a normal inability to leave the home and doing so requires considerable and taxing effort. Other absences are for medical reasons / religious services and are infrequent or of short duration when for other reasons). o If current dressing causes regression in wound condition, may D/C ordered dressing product/s and apply Normal Saline Moist Dressing daily until next Wound Healing Center / Other MD appointment. Notify  Wound Healing Center of regression in wound condition at [phone]. o Please direct any NON-WOUND related issues/requests for orders to patient's Primary Care Physician Electronic Signature(s) Signed: 03/16/2017 5:35:15 PM By: Elliot Gurney RN, BSN, Kim RN, BSN Signed: 03/16/2017 5:48:19 PM By: Bonnell Public Entered By: Elliot Gurney RN, BSN, Kim on 03/16/2017 17:32:17 [place], [name] ([id number]) -------------------------------------------------------------------------------- Problem List Details Patient Name: ELISABETH, STROM. Date of Service: 03/16/2017 3:45 PM Medical Record Number: [id number] Patient Account Number: [id number] Date of Birth/Sex: Sep 19, 1931 (83 y.o. 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 L97.221 Non-pressure chronic ulcer of left calf limited to 02/09/2017 Yes breakdown of skin I70.233 Atherosclerosis of native arteries of right leg with 02/25/2017 Yes ulceration of ankle I70.243 Atherosclerosis of native arteries of left leg with ulceration 02/25/2017 Yes of ankle Inactive Problems Resolved Problems Electronic Signature(s) Signed: 03/16/2017 4:49:25 PM By: Bonnell Public Entered By: Bonnell Public on 03/16/2017 16:49:25 Collazos, [name] ([id number]) -------------------------------------------------------------------------------- Progress Note Details [name] Date of Service: 03/16/2017 3:45 PM Medical Record Number: [id number] Patient Account Number: [id number] Date of Birth/Sex: Jul 18, 1931 (83 y.o. Female) Treating RN: Huel Coventry Primary Care Provider: Guy Begin Other Clinician: Referring Provider: Guy Begin Treating Provider/Extender: Kathreen Cosier in Treatment: 5 Subjective Chief Complaint Information obtained from Patient the patient is here for follow-up evaluation of her bilateral lower extremity ulcers History of Present Illness (HPI) 06/10/16; this is an 83 year old woman who lives near [place] with family members. She saw her primary doctor roughly over a week ago and was referred to the ER for a cellulitis in the right medial leg. She was given a round of IV antibiotics in the emergency room and discharged on oral Keflex which she is taking.  She is been left with a uncomfortable leg area on the right medial lower leg. Her ABIs in this clinic were 0.8 on the right 0.6 on the left. She does not describe claudication. She is not a diabetic. Lab work in the ER showed a normal BUN and creatinine on 6/20 at 18 and 0.92 respectively white count was 10.1 hemoglobin at 13.8. She did not have any imaging studies. I can see no vascular evaluations in Epic. She does not have a prior wound history 06/17/16; the area affected on her right medial ankle is an area I thought was due to tissue damage from cellulitis last week. She is going for venous reflux studies this afternoon, these were not ordered in this clinic at least not by me. The patient does not give a prior history of damage to the skin in this area although I wonder if she actually might not of noticed it. We have been using's Aquacel Ag under a wrap although we will not be able to wrap her today 06/24/16: pt reports today she hasn't heard from the vascular clinic regarding proceeding with arterial studies. our office will assist with this today. she denies s/s of infection. denies problems with her wraps. 07/07/16; since I last saw this woman she was admitted to hospital from 7/16 through 7/18 with sepsis syndrome felt to be secondary to her  ulcer. She was treated with Vanco and Zosyn the hospital discharged on Septra. Blood cultures were negative. Urine culture showed multiple species she is now feeling well using Santyl with border foam to her wound. [facility] 07/14/16; no major improvement here. She has a quarter-sized wound covered with thick adherent slough on the lateral aspect of her right leg with several surrounding satellite lesions in a similar state. She has had 2 recent bouts of cellulitis, one before she came to this clinic and one required a recent hospitalization. I have asked for arterial studies I don't believe these have ever been done. My notes suggest from early in July that she went for reflux studies although I don't know that I have ever seen these either, she did have a DVT rule out but I did not order this, this did not show a DVT but did show a Baker's cyst 07/21/2016 -- the patient had an arterial duplex study done with waveform suggestive of right femoropopliteal disease. There is also small vessel disease bilaterally. Right ABI was in the normal range of 1.0 and a TBI was 0.74. Left ABI is abnormal at 0.69 with a TBI of 0.40. Three-vessel runoff on the right and a two-vessel runoff on the left. LEROY, TRIM ([id number]) She has an appointment to see Dr. Kirke Corin later today. 07/28/2016 -- was seen by Dr. Kirke Corin who reviewed her arterial studies with an ABI of 0.94 on the right and 0.69 on the left with normal right toe brachial index. Duplex showed diffuse atherosclerosis without discrete stenosis and there was a three-vessel runoff on the right below the knee. Based on these studies and the location of the ulcer he thought it was venous commended she continue with wound care consults. 08/04/16; I note the patient's reasonably functional arterial supply which is fortunate. The wound bed appears to be smaller. Debrided of surface slough. She is been using Iodoflex 08/11/16: Only a very small  open area remains. However it has some depth 08/18/16; small open area is smaller. Still open however. 08/25/16; the area has totally closed down and epithelialized. READMISSION  02/09/17; this is an 83 year old woman who is not a diabetic or smoker. She was in our clinic in the summer into mid-September with a wound on her right medial calf that. This eventually closed down. I thought this was mostly venous. She did have evidence of PAD and she was seen by Dr. Kirke Corin. At that point she had an ABI of 0.94 on the right 0.69 on the left with a normal right toe brachial index. It was felt that she had enough blood flow to close her wound and that happened. She was discharged with stockings although I don't get the sense that she really use them she tells me that roughly a week or 2 she developed swelling in her bilateral lower legs and then noted wounds on her right lateral malleolus and left medial calf. The on this she is not really sure how this happened. She is not having a lot of pain although she is not ambulatory that much only in her home. Other than weight loss she has not noticed any other issues no specific claudication no chest pain. 02/16/17; the patient continues with 2 wounds on the right lateral malleolus and left medial calf. Both of these are painful. She has chronic venous insufficiency but also known PAD and is previously seen Dr. Kirke Corin. We have arranged vascular follow-up with Dr. Kirke Corin to look at the arterial situation 02/25/2017 -- patient was brought in today because she missed her appointment last week with Dr. Leanord Hawking and is having a lot of pain and drainage from her wounds. The interim she was seen by Dr. Bascom Levels who did a duplex of the right lower extremity which shows a right ABI of 0.95 and left ABI 0.74. There was also a abdominal aortogram with a bilateral extremity angiography done for significant peripheral vascular disease. The right lower extremity showed diffuse SFA and  popliteal disease with significant stenosis affecting the TP trunk just before the origin of the peritoneal artery. The left lower extremity showed moderate left SFA stenosis with occluded popliteal artery and tibioperoneal vessels with only visible collateral. The opinion was the patient had no endovascular surgical revascularization options of the left lower extremity and Dr. Bascom Levels would discuss it with Dr. Randie Heinz for a second opinion. Endovascular intervention on the right TP trunk could be considered. 03/03/17; the patient arrives back today having last seen Dr. Meyer Russel. As noted she has severe PAD which is not amenable to either bypass surgery or endovascular interventions on the left. Endovascular interventions in the right TP trunk could be considered. She has large wounds on the right medial leg and a sizable wound on the right lateral leg. Small wound on the left medial leg. She is really in a lot of discomfort. Using Santyl on the right and I believe Prisma on the left. 03/10/17; the patient arrives today again with large wounds on the right lateral lower extremity, right medial lower extremity. And a small punched out wound on the left medial lower extremity. These are in the setting of severe non-revascularizable PAD. She continues to complain of discomfort although I don't think right now this is unmanageable. We have been using Santyl. We are arranging getting [facility]] 03/16/17- patient is here for follow-up evaluation of her bilateral lower extremity ulcers. She has an TREAZURE, NERY ([id number]) appointment on 4/11 for an arthrectomy per Dr. Kirke Corin at [facility]. She is continuing to [facility]. Her son is doing her dressing changes Objective Constitutional BP within normal  limits. afebrile. well nourished; well developed; appears stated age;Marland Kitchen Vitals Time Taken: 3:22 PM, Height: 66 in, Weight: 159 lbs, BMI: 25.7, Temperature: 98.5 F, Pulse: 81 bpm,  Respiratory Rate: 16 breaths/min, Blood Pressure: 149/74 mmHg. Cardiovascular BLE- non-palpable DP and PT. Psychiatric calm, pleasant, conversive. Integumentary (Hair, Skin) Wound #2 status is Open. Original cause of wound was Gradually Appeared. The wound is located on the Right,Lateral Malleolus. The wound measures 2.8cm length x 2cm width x 0.2cm depth; 4.398cm^2 area and 0.88cm^3 volume. There is Fat Layer (Subcutaneous Tissue) Exposed exposed. There is no tunneling or undermining noted. There is a medium amount of serosanguineous drainage noted. The wound margin is distinct with the outline attached to the wound base. There is small (1-33%) pink, pale granulation within the wound bed. There is a large (67-100%) amount of necrotic tissue within the wound bed including Adherent Slough. The periwound skin appearance exhibited: Hemosiderin Staining, Mottled. The periwound skin appearance did not exhibit: Callus, Crepitus, Excoriation, Induration, Rash, Scarring, Dry/Scaly, Maceration, Atrophie Blanche, Cyanosis, Ecchymosis, Pallor, Rubor, Erythema. Periwound temperature was noted as No Abnormality. The periwound has tenderness on palpation. Wound #3 status is Open. Original cause of wound was Gradually Appeared. The wound is located on the Left,Medial Lower Leg. The wound measures 1.8cm length x 1.5cm width x 0.1cm depth; 2.121cm^2 area and 0.212cm^3 volume. There is Fat Layer (Subcutaneous Tissue) Exposed exposed. There is no tunneling or undermining noted. There is a medium amount of serosanguineous drainage noted. The wound margin is distinct with the outline attached to the wound base. There is small (1-33%) pink, pale granulation within the wound bed. There is a large (67-100%) amount of necrotic tissue within the wound bed including Adherent Slough. The periwound skin appearance exhibited: Hemosiderin Staining, Mottled. The periwound skin appearance did not exhibit: Callus, Crepitus,  Excoriation, Induration, Rash, Scarring, Dry/Scaly, Maceration, Atrophie Blanche, Cyanosis, Ecchymosis, Pallor, Rubor, Erythema. Periwound temperature was noted as No Abnormality. The periwound has tenderness on palpation. Wound #4 status is Open. Original cause of wound was Gradually Appeared. The wound is located on the Right,Medial Lower Leg. The wound measures 7.5cm length x 5.2cm width x 0.1cm depth; 30.631cm^2 Wattenbarger, Lindamarie Hopkins. ([id number]) area and 3.063cm^3 volume. There is Fat Layer (Subcutaneous Tissue) Exposed exposed. There is no tunneling or undermining noted. There is a large amount of serosanguineous drainage noted. The wound margin is distinct with the outline attached to the wound base. There is small (1-33%) pink, pale granulation within the wound bed. There is a large (67-100%) amount of necrotic tissue within the wound bed including Adherent Slough. The periwound skin appearance exhibited: Hemosiderin Staining, Mottled. The periwound skin appearance did not exhibit: Callus, Crepitus, Excoriation, Induration, Rash, Scarring, Dry/Scaly, Maceration, Atrophie Blanche, Cyanosis, Ecchymosis, Pallor, Rubor, Erythema. Periwound temperature was noted as No Abnormality. The periwound has tenderness on palpation. 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 There was a  Non-Selective Chemical/enzymatic debridement (non-viable tissue was removed) performed  by Bonnell Public, NP.Marland Kitchen A time out was not conducted prior to the start of the procedure. The procedure was tolerated well with a pain level of 0 throughout and a pain level of 0 following the procedure. Post Debridement Measurements: 2.8cm length x 2cm width x 0.2cm depth; 0.88cm^3 volume. Character of Wound/Ulcer Post Debridement requires further debridement. Severity of Tissue Post Debridement is: Fat layer exposed. Post procedure Diagnosis Wound #2: Same as Pre-Procedure Wound #3 Wound #3 is a Venous Leg Ulcer located on the Left,Medial Lower Leg . There was a Non-Selective Chemical/enzymatic debridement (non-viable tissue was removed) performed by Bonnell Public, NP.Marland Kitchen A time out was not conducted prior to the start of the procedure. The procedure was tolerated well with a pain level of 0 throughout and a pain level of 0 following the procedure. Post Debridement Measurements: 1.8cm length x 1.5cm width x 0.2cm depth; 0.424cm^3 volume. DESA, RECH ([id number]) Character of Wound/Ulcer Post Debridement requires further debridement. Severity of Tissue Post Debridement is: Fat layer exposed. Post procedure Diagnosis Wound #3: Same as Pre-Procedure Wound #4 Wound #4 is a Venous Leg Ulcer located on the Right,Medial Lower Leg . There was a Non-Selective Chemical/enzymatic debridement (non-viable tissue was removed) performed by Bonnell Public, NP.Marland Kitchen A time out was not conducted prior to the start of the procedure. The procedure was tolerated well with a pain level of 0 throughout and a pain level of 0 following the procedure. Post Debridement Measurements: 7.5cm length x 5.2cm width x 0.1cm depth; 3.063cm^3 volume. Character of Wound/Ulcer Post Debridement requires further debridement. Severity of Tissue Post Debridement is: Fat layer exposed. Post procedure Diagnosis Wound #4: Same as  Pre-Procedure Plan Wound Cleansing: Wound #2 Right,Lateral Malleolus: Cleanse wound with mild soap and water Wound #3 Left,Medial Lower Leg: Cleanse wound with mild soap and water Wound #4 Right,Medial Lower Leg: Cleanse wound with mild soap and water Anesthetic: Wound #2 Right,Lateral Malleolus: Topical Lidocaine 4% cream applied to wound bed prior to debridement - clinic use only Wound #3 Left,Medial Lower Leg: Topical Lidocaine 4% cream applied to wound bed prior to debridement - clinic use only Wound #4 Right,Medial Lower Leg: Topical Lidocaine 4% cream applied to wound bed prior to debridement - clinic use only Primary Wound Dressing: Wound #2 Right,Lateral Malleolus: Santyl Ointment Wound #3 Left,Medial Lower Leg: Santyl Ointment Wound #4 Right,Medial Lower Leg: Santyl Ointment Secondary Dressing: Wound #2 Right,Lateral Malleolus: Conform/Kerlix Non-adherent pad Other - Ace Wrap lightly to Wound #3 Left,Medial Lower Leg: Conform/Kerlix Peron, Naika Hopkins. ([id number]) Non-adherent pad Other - Ace Wrap lightly to Wound #4 Right,Medial Lower Leg: Conform/Kerlix Non-adherent pad Other - Ace Wrap lightly to Dressing Change Frequency: Wound #2 Right,Lateral Malleolus: Change dressing every day. - HHRN twice weekly. May teach available family member wound care for additional days. Wound #3 Left,Medial Lower Leg: Change dressing every day. - HHRN twice weekly. May teach available family member wound care for additional days. Wound #4 Right,Medial Lower Leg: Change dressing every day. - HHRN twice weekly. May teach available family member wound care for additional days. Follow-up Appointments: Wound #2 Right,Lateral Malleolus: Return Appointment in 1 week. Wound #3 Left,Medial Lower Leg: Return Appointment in 1 week. Wound #4 Right,Medial Lower Leg: Return Appointment in 1 week. Edema Control: Wound #2 Right,Lateral Malleolus: Elevate legs to the level of the heart  and pump ankles as often as possible Wound #3 Left,Medial Lower Leg: Elevate legs to the level of the heart and pump ankles as often as possible Wound #  4 Right,Medial Lower Leg: Elevate legs to the level of the heart and pump ankles as often as possible Additional Orders / Instructions: Wound #2 Right,Lateral Malleolus: Increase protein intake. Wound #3 Left,Medial Lower Leg: Increase protein intake. Wound #4 Right,Medial Lower Leg: Increase protein intake. Home Health: Wound #2 Right,Lateral Malleolus: Initiate Home [facility] for Skilled Nursing - [facility] Continue Home Health Visits - Twice weekly. Patient comes to wound care center on Tuesdays. Home Health Nurse may visit PRN to address patient s wound care needs. FACE TO FACE ENCOUNTER: MEDICARE and MEDICAID PATIENTS: I certify that this patient is under my care and that I had a face-to-face encounter that meets the physician face-to-face encounter requirements with this patient on this date. The encounter with the patient was in whole or in part for the following MEDICAL CONDITION: (primary reason for Home Healthcare) MEDICAL NECESSITY: I certify, that based on my findings, NURSING services are a medically necessary home health service. HOME BOUND STATUS: I certify that my clinical findings support that this patient is homebound (i.e., Due to illness or injury, pt requires aid of supportive devices such as crutches, cane, wheelchairs, walkers, the use of special transportation or the assistance of another person to leave their place of residence. There is a normal inability to leave the home and doing so requires considerable and taxing effort. Other absences are JEANEEN, CALA ([id number]) for medical reasons / religious services and are infrequent or of short duration when for other reasons). If current dressing causes regression in wound condition, may D/C ordered dressing product/s and apply Normal Saline Moist Dressing  daily until next Wound Healing Center / Other MD appointment. Notify Wound Healing Center of regression in wound condition at [phone]. Please direct any NON-WOUND related issues/requests for orders to patient's Primary Care Physician Wound #3 Left,Medial Lower Leg: Initiate Home Health for Skilled Nursing - [facility] Continue Home Health Visits - Twice weekly. Patient comes to wound care center on Tuesdays. Home Health Nurse may visit PRN to address patient s wound care needs. FACE TO FACE ENCOUNTER: MEDICARE and MEDICAID PATIENTS: I certify that this patient is under my care and that I had a face-to-face encounter that meets the physician face-to-face encounter requirements with this patient on this date. The encounter with the patient was in whole or in part for the following MEDICAL CONDITION: (primary reason for Home Healthcare) MEDICAL NECESSITY: I certify, that based on my findings, NURSING services are a medically necessary home health service. HOME BOUND STATUS: I certify that my clinical findings support that this patient is homebound (i.e., Due to illness or injury, pt requires aid of supportive devices such as crutches, cane, wheelchairs, walkers, the use of special transportation or the assistance of another person to leave their place of residence. There is a normal inability to leave the home and doing so requires considerable and taxing effort. Other absences are for medical reasons / religious services and are infrequent or of short duration when for other reasons). If current dressing causes regression in wound condition, may D/C ordered dressing product/s and apply Normal Saline Moist Dressing daily until next Wound Healing Center / Other MD appointment. Notify Wound Healing Center of regression in wound condition at [phone]. Please direct any NON-WOUND related issues/requests for orders to patient's Primary Care Physician Wound #4 Right,Medial Lower  Leg: Initiate [facility] for [facility]illed [facility] - [facility] Continue Home Health Visits - Twice weekly. Patient comes to wound care center on  Tuesdays. Home Health Nurse may visit PRN to address patient s wound care needs. FACE TO FACE ENCOUNTER: MEDICARE and MEDICAID PATIENTS: I certify that this patient is under my care and that I had a face-to-face encounter that meets the physician face-to-face encounter requirements with this patient on this date. The encounter with the patient was in whole or in part for the following MEDICAL CONDITION: (primary reason for Home Healthcare) MEDICAL NECESSITY: I certify, that based on my findings, NURSING services are a medically necessary home health service. HOME BOUND STATUS: I certify that my clinical findings support that this patient is homebound (i.e., Due to illness or injury, pt requires aid of supportive devices such as crutches, cane, wheelchairs, walkers, the use of special transportation or the assistance of another person to leave their place of residence. There is a normal inability to leave the home and doing so requires considerable and taxing effort. Other absences are for medical reasons / religious services and are infrequent or of short duration when for other reasons). If current dressing causes regression in wound condition, may D/C ordered dressing product/s and apply Normal Saline Moist Dressing daily until next Wound Healing Center / Other MD appointment. Notify Wound Healing Center of regression in wound condition at [phone]. Please direct any NON-WOUND related issues/requests for orders to patient's Primary Care Physician 1. Continue with Santyl daily KARMAN, BISWELL. ([id number]) 2. Follow-up in 2 weeks, contact wound care center with any changes or concerns prior to the scheduled appointment Electronic Signature(s) Signed: 03/17/2017 4:26:12 PM By: Bonnell Public Previous Signature: 03/17/2017 7:16:13 AM Version By:  Bonnell Public Previous Signature: 03/16/2017 4:58:28 PM Version By: Bonnell Public Entered By: Bonnell Public on 03/17/2017 16:26:12 Saleeby, Jalysa Shela Commons ([id number]) -------------------------------------------------------------------------------- SuperBill Details [name] Date of Service: 03/16/2017 Medical Record Number: [id number] Patient Account Number: [id number] Date of Birth/Sex: 1931-02-01 (83 y.o. 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 L97.221 Non-pressure chronic ulcer of left calf limited to breakdown of skin I70.233 Atherosclerosis of native arteries of right leg with ulceration of ankle I70.243 Atherosclerosis of native arteries of left leg with ulceration of ankle Facility Procedures CPT4 Code: 29528413 Description: 99213 - WOUND CARE VISIT-LEV 3 EST PT Modifier: Quantity: 1 CPT4 Code: 24401027 Description: 25366 - DEBRIDE W/O ANES NON SELECT Modifier: Quantity: 1 Physician Procedures CPT4 Code Description: 4403474 25956 - WC PHYS LEVEL 3 - EST PT ICD-10 Description Diagnosis I70.233 Atherosclerosis of native arteries of right leg with I70.243 Atherosclerosis of native arteries of left leg with u Modifier: ulceration of an lceration of ank Quantity: 1 kle le Electronic Signature(s) Signed: 03/17/2017 1:12:50 PM By: Elliot Gurney, RN, BSN, Kim RN, BSN Signed: 03/17/2017 4:22:06 PM By: Bonnell Public Previous Signature: 03/16/2017 5:35:15 PM Version By: Elliot Gurney RN, BSN, Kim RN, BSN Previous Signature: 03/16/2017 5:48:19 PM Version By: Bonnell Public Previous Signature: 03/16/2017 4:58:43 PM Version By:  Bonnell Public Entered By: Elliot Gurney RN, BSN, Kim on 03/17/2017 12:58:39

## 2017-03-17 NOTE — Progress Notes (Signed)
[name] ([id number]) Visit Report for 03/16/2017 Arrival Information Details [name], [name]. Date of Service: 03/16/2017 3:45 PM Medical Record Number: [id number] Patient Account Number: [id number] Date of Birth/Sex: January 14, 1931 (83 y.o. Female) Treating RN: [name] Primary Care [name]: [name] Other Clinician: Referring [name]: [name] Treating [name]/Extender: [name] in Treatment: 5 Visit Information History Since Last Visit Added or deleted any medications: No Patient Arrived: Wheel Chair Any new allergies or adverse reactions: No Arrival Time: 16:20 Had a fall or experienced change in No Accompanied By: son, [name] activities of daily living that may affect Transfer Assistance: None risk of falls: Patient Identification Verified: Yes Signs or symptoms of abuse/neglect since last No Secondary Verification Process Yes visito Completed: Hospitalized since last visit: No Patient Requires Transmission- No Has Dressing in Place as Prescribed: Yes Based Precautions: Pain Present Now: No Patient Has Alerts: Yes Patient Alerts: Patient on Blood Thinner Plavix Electronic Signature(s) Signed: 03/16/2017 5:35:15 PM By: Jordan Gurney, RN, Hopkins, [name] Entered By: Jordan Gurney, RN, Hopkins, Jordan on 03/16/2017 16:21:39 Schrodt, [name] ([id number]) -------------------------------------------------------------------------------- Clinic Level of Care Assessment Details [name], [name]. Date of Service: 03/16/2017 3:45 PM Medical Record Number: [id number] Patient Account Number: [id number] Date of Birth/Sex: Oct 24, 1931 (83 y.o. Female) Treating RN: [name] Primary Care [name]: [name] Other Clinician: Referring [name]: [name] Treating [name]/Extender: [name] in Treatment: 5 Clinic Level of Care Assessment Items TOOL 4 Quantity Score  - Use when only an EandM is performed on FOLLOW-UP visit  0 ASSESSMENTS - Nursing Assessment / Reassessment  - Reassessment of Co-morbidities (includes updates in patient status) 0 X - Reassessment of Adherence to Treatment Plan 1 5 ASSESSMENTS - Wound and Skin Assessment / Reassessment X - Simple Wound Assessment / Reassessment - one wound 1 5  - Complex Wound Assessment / Reassessment - multiple wounds 0  - Dermatologic / Skin Assessment (not related to wound area) 0 ASSESSMENTS - Focused Assessment  - Circumferential Edema Measurements - multi extremities 0  - Nutritional Assessment / Counseling / Intervention 0  - Lower Extremity Assessment (monofilament, tuning fork, pulses) 0  - Peripheral Arterial Disease Assessment (using hand held doppler) 0 ASSESSMENTS - Ostomy and/or Continence Assessment and Care  - Incontinence Assessment and Management 0  - Ostomy Care Assessment and Management (repouching, etc.) 0 PROCESS - Coordination of Care X - Simple Patient / Family Education for ongoing care 1 15  - Complex (extensive) Patient / Family Education for ongoing care 0 X - Staff obtains Chiropractor, Records, Test Results / Process Orders 1 10  - Staff telephones HHA, Nursing Homes / Clarify orders / etc 0  - Routine Transfer to another Facility (non-emergent condition) 0 Chronister, Jordan J. ([id number])  - Routine Hospital Admission (non-emergent condition) 0  - New Admissions / Manufacturing engineer / Ordering NPWT, Apligraf, etc. 0  - Emergency Hospital Admission (emergent condition) 0 X - Simple Discharge Coordination 1 10  - Complex (extensive) Discharge Coordination 0 PROCESS - Special Needs  - Pediatric / Minor Patient Management 0  - Isolation Patient Management 0  - Hearing / Language / Visual special needs 0  - Assessment of Community assistance (transportation, D/C planning, etc.) 0  - Additional assistance / Altered mentation 0  - Support Surface(s) Assessment (bed, cushion, seat, etc.)  0 INTERVENTIONS - Wound Cleansing / Measurement  - Simple Wound Cleansing - one wound 0 X - Complex Wound Cleansing - multiple wounds 3  5 X - Wound Imaging (photographs - any number of wounds) 1 5  - Wound Tracing (instead of photographs) 0  - Simple Wound Measurement - one wound 0 X - Complex Wound Measurement - multiple wounds 3 5 INTERVENTIONS - Wound Dressings  - Small Wound Dressing one or multiple wounds 0 X - Medium Wound Dressing one or multiple wounds 2 15  - Large Wound Dressing one or multiple wounds 0  - Application of Medications - topical 0  - Application of Medications - injection 0 INTERVENTIONS - Miscellaneous  - External ear exam 0 Hearst, Jordan J. ([id number])  - Specimen Collection (cultures, biopsies, blood, body fluids, etc.) 0  - Specimen(s) / Culture(s) sent or taken to Lab for analysis 0  - Patient Transfer (multiple staff / Michiel Sites Lift / Similar devices) 0  - Simple Staple / Suture removal (25 or less) 0  - Complex Staple / Suture removal (26 or more) 0  - Hypo / Hyperglycemic Management (close monitor of Blood Glucose) 0  - Ankle / Brachial Index (ABI) - do not check if billed separately 0 X - Vital Signs 1 5 Has the patient been seen at the hospital within the last three years: Yes Total Score: 115 Level Of Care: New/Established - Level 3 Electronic Signature(s) Signed: 03/16/2017 5:35:15 PM By: Jordan Gurney, RN, Hopkins, [name] Entered By: Jordan Gurney, RN, Hopkins, Jordan on 03/16/2017 16:59:22 Hofmeister, [name] ([id number]) -------------------------------------------------------------------------------- Encounter Discharge Information Details [name] Date of Service: 03/16/2017 3:45 PM Medical Record Number: [id number] Patient Account Number: [id number] Date of Birth/Sex: 01-21-1931 (83 y.o. Female) Treating RN: [name] Primary Care Jahniah Pallas: [name] Other Clinician: Referring Ameena Vesey: [name] Treating  Caydon Feasel/Extender: [name] in Treatment: 5 Encounter Discharge Information Items Schedule Follow-up Appointment: No Medication Reconciliation completed No and provided to Patient/Care Lindsay Soulliere: Provided on Clinical Summary of Care: 03/16/2017 Form Type Recipient Paper Patient CF Electronic Signature(s) Signed: 03/16/2017 4:59:05 PM By: Gwenlyn Perking Entered By: Gwenlyn Perking on 03/16/2017 16:59:05 Lumley, [name] ([id number]) -------------------------------------------------------------------------------- Lower Extremity Assessment Details [name] Date of Service: 03/16/2017 3:45 PM Medical Record Number: [id number] Patient Account Number: [id number] Date of Birth/Sex: 1931/09/27 (83 y.o. Female) Treating RN: [name] Primary Care Vitoria Conyer: [name] Other Clinician: Referring Ronaldo Crilly: [name] Treating Aviannah Castoro/Extender: [name] in Treatment: 5 Vascular Assessment Pulses: Dorsalis Pedis Palpable: [Left:Yes] [Right:Yes] Posterior Tibial Extremity colors, hair growth, and conditions: Extremity Color: [Left:Hyperpigmented] [Right:Hyperpigmented] Hair Growth on Extremity: [Left:No] [Right:No] Temperature of Extremity: [Left:Warm] [Right:Warm] Capillary Refill: [Left:> 3 seconds] [Right:> 3 seconds] Dependent Rubor: [Left:No] [Right:No] Blanched when Elevated: [Left:No] [Right:No] Lipodermatosclerosis: [Left:No] [Right:No] Toe Nail Assessment Left: Right: Thick: Yes Yes Discolored: Yes Yes Deformed: Yes Yes Improper Length and Hygiene: Yes Yes Electronic Signature(s) Signed: 03/16/2017 5:35:15 PM By: Jordan Gurney, RN, Hopkins, [name] Entered By: Jordan Gurney, RN, Hopkins, Jordan on 03/16/2017 16:31:54 Santori, [name] ([id number]) -------------------------------------------------------------------------------- Multi Wound Chart Details [name] Date of Service: 03/16/2017 3:45 PM Medical Record Number:  [id number] Patient Account Number: [id number] Date of Birth/Sex: 10/14/1931 (83 y.o. Female) Treating RN: [name] Primary Care Ferol Laiche: [name] Other Clinician: Referring Dainelle Hun: [name] Treating Rain Wilhide/Extender: [name] in Treatment: 5 Vital Signs Height(in): 66 Pulse(bpm): 81 Weight(lbs): 159 Blood Pressure 149/74 (mmHg): Body Mass Index(BMI): 26 Temperature(F): 98.5 Respiratory Rate 16 (breaths/min): Photos: [2:No Photos] [3:No Photos] [4:No Photos] Wound Location: [2:Right Malleolus - Lateral] [3:Left Lower Leg - Medial] [4:Right Lower Leg -  Medial] Wounding Event: [2:Gradually Appeared] [3:Gradually Appeared] [4:Gradually Appeared] Primary Etiology: [2:Venous Leg Ulcer] [3:Venous Leg Ulcer] [4:Venous Leg Ulcer] Comorbid History: [2:Cataracts, Arrhythmia, Hypertension, Osteoarthritis] [3:Cataracts, Arrhythmia, Hypertension, Osteoarthritis] [4:Cataracts, Arrhythmia, Hypertension, Osteoarthritis] Date Acquired: [2:01/19/2017] [3:01/19/2017] [4:02/16/2017] Weeks of Treatment: [2:5] [3:5] [4:4] Wound Status: [2:Open] [3:Open] [4:Open] Measurements L x W x D 2.8x2x0.2 [3:1.8x1.5x0.1] [4:7.5x5.2x0.1] (cm) Area (cm) : [2:4.398] [3:2.121] [4:30.631] Volume (cm) : [2:0.88] [3:0.212] [4:3.063] % Reduction in Area: [2:-6.30%] [3:-439.70%] [4:-19410.20%] % Reduction in Volume: -112.60% [3:-443.60%] [4:-19043.70%] Classification: [2:Partial Thickness] [3:Partial Thickness] [4:Partial Thickness] Exudate Amount: [2:Medium] [3:Medium] [4:Large] Exudate Type: [2:Serosanguineous] [3:Serosanguineous] [4:Serosanguineous] Exudate Color: [2:red, brown] [3:red, brown] [4:red, brown] Wound Margin: [2:Distinct, outline attached] [3:Distinct, outline attached] [4:Distinct, outline attached] Granulation Amount: [2:Small (1-33%)] [3:Small (1-33%)] [4:Small (1-33%)] Granulation Quality: [2:Pink, Pale] [3:Pink, Pale] [4:Pink, Pale] Necrotic Amount: [2:Large (67-100%)]  [3:Large (67-100%)] [4:Large (67-100%)] Exposed Structures: [2:Fat Layer (Subcutaneous Tissue) Exposed: Yes Fascia: No Tendon: No Muscle: No] [3:Fat Layer (Subcutaneous Tissue) Exposed: Yes Fascia: No Tendon: No Muscle: No] [4:Fat Layer (Subcutaneous Tissue) Exposed: Yes Fascia: No Tendon: No Muscle: No] Joint: No Joint: No Joint: No Bone: No Bone: No Bone: No Epithelialization: None None None Periwound Skin Texture: Excoriation: No Excoriation: No Excoriation: No Induration: No Induration: No Induration: No Callus: No Callus: No Callus: No Crepitus: No Crepitus: No Crepitus: No Rash: No Rash: No Rash: No Scarring: No Scarring: No Scarring: No Periwound Skin Maceration: No Maceration: No Maceration: No Moisture: Dry/Scaly: No Dry/Scaly: No Dry/Scaly: No Periwound Skin Color: Hemosiderin Staining: Yes Hemosiderin Staining: Yes Hemosiderin Staining: Yes Mottled: Yes Mottled: Yes Mottled: Yes Atrophie Blanche: No Atrophie Blanche: No Atrophie Blanche: No Cyanosis: No Cyanosis: No Cyanosis: No Ecchymosis: No Ecchymosis: No Ecchymosis: No Erythema: No Erythema: No Erythema: No Pallor: No Pallor: No Pallor: No Rubor: No Rubor: No Rubor: No Temperature: No Abnormality No Abnormality No Abnormality Tenderness on Yes Yes Yes Palpation: Wound Preparation: Ulcer Cleansing: Other: Ulcer Cleansing: Other: Ulcer Cleansing: Other: surg scrub and water surg scrub and water surg scrub and water Topical Anesthetic Topical Anesthetic Topical Anesthetic Applied: Other: lidocaine Applied: Other: lidocaine 4 Applied: Other: lidocaine 4% % 4% Treatment Notes Electronic Signature(s) Signed: 03/16/2017 4:49:34 PM By: Bonnell Public Entered By: Bonnell Public on 03/16/2017 16:49:34 Sagun, [name] ([id number]) -------------------------------------------------------------------------------- Multi-Disciplinary Care Plan Details [name], [name]. Date of Service:  03/16/2017 3:45 PM Medical Record Number: [id number] Patient Account Number: [id number] Date of Birth/Sex: [birth year] (83 y.o. Female) Treating RN: [name] Primary Care Ludwin Flahive: [name] Other Clinician: Referring Shakim Faith: [name] Treating Areliz Rothman/Extender: [name] in Treatment: 5 Active Inactive ` Abuse / Safety / Falls / Self Care Management Nursing Diagnoses: Potential for falls Goals: Patient will remain injury free Date Initiated: 02/09/2017 Target Resolution Date: 04/17/2017 Goal Status: Active Interventions: Assess fall risk on admission and as needed Assess self care needs on admission and as needed Notes: ` Nutrition Nursing Diagnoses: Imbalanced nutrition Goals: Patient/caregiver agrees to and verbalizes understanding of need to use nutritional supplements and/or vitamins as prescribed Date Initiated: 02/09/2017 Target Resolution Date: 04/17/2017 Goal Status: Active Interventions: Assess patient nutrition upon admission and as needed per policy Notes: ` Orientation to the Wound Care Program Nursing Diagnoses: DORATHA, MCSWAIN ([id number]) Knowledge deficit related to the wound healing center program Goals: Patient/caregiver will verbalize understanding of the Wound Healing Center Program Date Initiated: 02/09/2017 Target Resolution Date: 02/20/2017 Goal Status: Active Interventions: Provide education on orientation to the wound center Notes: ` Pain, Acute or Chronic Nursing  Diagnoses: Pain, acute or chronic: actual or potential Potential alteration in comfort, pain Goals: Patient/caregiver will verbalize adequate pain control between visits Date Initiated: 02/09/2017 Target Resolution Date: 04/17/2017 Goal Status: Active Interventions: Assess comfort goal upon admission Complete pain assessment as per visit requirements Notes: ` Wound/Skin Impairment Nursing Diagnoses: Impaired tissue integrity Knowledge deficit related to  smoking impact on wound healing Knowledge deficit related to ulceration/compromised skin integrity Goals: Ulcer/skin breakdown will have a volume reduction of 80% by week 12 Date Initiated: 02/09/2017 Target Resolution Date: 04/10/2017 Goal Status: Active Interventions: Assess patient/caregiver ability to perform ulcer/skin care regimen upon admission and as needed Assess ulceration(s) every visit [name], [name] ([id number]) Notes: Electronic Signature(s) Signed: 03/16/2017 5:35:15 PM By: Jordan Gurney, RN, Hopkins, [name] Entered By: Jordan Gurney, RN, Hopkins, Jordan on 03/16/2017 16:32:00 [name], [name] ([id number]) -------------------------------------------------------------------------------- Pain Assessment Details [name] Date of Service: 03/16/2017 3:45 PM Medical Record Number: [id number] Patient Account Number: [id number] Date of Birth/Sex: 1930/12/24 (83 y.o. Female) Treating RN: [name] Primary Care Kathye Cipriani: [name] Other Clinician: Referring Skeeter Sheard: [name] Treating Rebecka Oelkers/Extender: [name] in Treatment: 5 Active Problems Location of Pain Severity and Description of Pain Patient Has Paino Yes Site Locations Pain Location: Pain in Ulcers With Dressing Change: Yes Duration of the Pain. Constant / Intermittento Intermittent Rate the pain. Current Pain Level: 8 Character of Pain Describe the Pain: Tender, Other: tingling Pain Management and Medication Current Pain Management: Electronic Signature(s) Signed: 03/16/2017 5:35:15 PM By: Jordan Gurney, RN, Hopkins, [name] Entered By: Jordan Gurney, RN, Hopkins, Jordan on 03/16/2017 16:22:15 Hazzard, [name] ([id number]) -------------------------------------------------------------------------------- Wound Assessment Details [name], [name]. Date of Service: 03/16/2017 3:45 PM Medical Record Number: [id number] Patient Account Number: [id number] Date of Birth/Sex: [birth year] (83 y.o.  Female) Treating RN: [name] Primary Care Davonta Stroot: [name] Other Clinician: Referring Mostyn Varnell: [name] Treating Ladaisha Portillo/Extender: [name] in Treatment: 5 Wound Status Wound Number: 2 Primary Venous Leg Ulcer Etiology: Wound Location: Right Malleolus - Lateral Wound Status: Open Wounding Event: Gradually Appeared Comorbid Cataracts, Arrhythmia, Hypertension, Date Acquired: 01/19/2017 History: Osteoarthritis Weeks Of Treatment: 5 Clustered Wound: No Wound Measurements Length: (cm) 2.8 Width: (cm) 2 Depth: (cm) 0.2 Area: (cm) 4.398 Volume: (cm) 0.88 % Reduction in Area: -6.3% % Reduction in Volume: -112.6% Epithelialization: None Tunneling: No Undermining: No Wound Description Classification: Partial Thickness Wound Margin: Distinct, outline attached Exudate Amount: Medium Exudate Type: Serosanguineous Exudate Color: red, brown Foul Odor After Cleansing: No Slough/Fibrino Yes Wound Bed Granulation Amount: Small (1-33%) Exposed Structure Granulation Quality: Pink, Pale Fascia Exposed: No Necrotic Amount: Large (67-100%) Fat Layer (Subcutaneous Tissue) Exposed: Yes Necrotic Quality: Adherent Slough Tendon Exposed: No Muscle Exposed: No Joint Exposed: No Bone Exposed: No Periwound Skin Texture Texture Color No Abnormalities Noted: No No Abnormalities Noted: No Callus: No Atrophie Blanche: No Crepitus: No Cyanosis: No Excoriation: No Ecchymosis: No Induration: No Erythema: No Rash: No Hemosiderin Staining: Yes Pongratz, Jordan J. ([id number]) Scarring: No Mottled: Yes Pallor: No Moisture Rubor: No No Abnormalities Noted: No Dry / Scaly: No Temperature / Pain Maceration: No Temperature: No Abnormality Tenderness on Palpation: Yes Wound Preparation Ulcer Cleansing: Other: surg scrub and water, Topical Anesthetic Applied: Other: lidocaine 4%, Electronic Signature(s) Signed: 03/16/2017 5:35:15 PM By: Jordan Gurney, RN, Hopkins, Kim  RN, Hopkins Entered By: Jordan Gurney, RN, Hopkins, Jordan on 03/16/2017 16:29:48 [name] ([id number]) -------------------------------------------------------------------------------- Wound Assessment Details [name], [name]. Date of Service: 03/16/2017 3:45 PM Medical Record Number:  [id number] Patient Account Number: [id number] Date of Birth/Sex: 11-27-1931 (83 y.o. Female) Treating RN: [name] Primary Care Zanasia Hickson: [name] Other Clinician: Referring Xsavier Seeley: [name] Treating Tyreon Frigon/Extender: [name] in Treatment: 5 Wound Status Wound Number: 3 Primary Venous Leg Ulcer Etiology: Wound Location: Left Lower Leg - Medial Wound Status: Open Wounding Event: Gradually Appeared Comorbid Cataracts, Arrhythmia, Hypertension, Date Acquired: 01/19/2017 History: Osteoarthritis Weeks Of Treatment: 5 Clustered Wound: No Wound Measurements Length: (cm) 1.8 Width: (cm) 1.5 Depth: (cm) 0.1 Area: (cm) 2.121 Volume: (cm) 0.212 % Reduction in Area: -439.7% % Reduction in Volume: -443.6% Epithelialization: None Tunneling: No Undermining: No Wound Description Classification: Partial Thickness Wound Margin: Distinct, outline attached Exudate Amount: Medium Exudate Type: Serosanguineous Exudate Color: red, brown Foul Odor After Cleansing: No Slough/Fibrino Yes Wound Bed Granulation Amount: Small (1-33%) Exposed Structure Granulation Quality: Pink, Pale Fascia Exposed: No Necrotic Amount: Large (67-100%) Fat Layer (Subcutaneous Tissue) Exposed: Yes Necrotic Quality: Adherent Slough Tendon Exposed: No Muscle Exposed: No Joint Exposed: No Bone Exposed: No Periwound Skin Texture Texture Color No Abnormalities Noted: No No Abnormalities Noted: No Callus: No Atrophie Blanche: No Crepitus: No Cyanosis: No Excoriation: No Ecchymosis: No Induration: No Erythema: No Rash: No Hemosiderin Staining: Yes Flemings, Jordan J. ([id number]) Scarring:  No Mottled: Yes Pallor: No Moisture Rubor: No No Abnormalities Noted: No Dry / Scaly: No Temperature / Pain Maceration: No Temperature: No Abnormality Tenderness on Palpation: Yes Wound Preparation Ulcer Cleansing: Other: surg scrub and water, Topical Anesthetic Applied: Other: lidocaine 4 %, Electronic Signature(s) Signed: 03/16/2017 5:35:15 PM By: Jordan Gurney, RN, Hopkins, [name] Entered By: Jordan Gurney, RN, Hopkins, Jordan on 03/16/2017 16:29:59 Antonucci, [name] ([id number]) -------------------------------------------------------------------------------- Wound Assessment Details [name], [name]. Date of Service: 03/16/2017 3:45 PM Medical Record Number: [id number] Patient Account Number: [id number] Date of Birth/Sex: [birth year] (83 y.o. Female) Treating RN: [name] Primary Care Donyel Nester: [name] Other Clinician: Referring Imara Standiford: [name] Treating Maloree Uplinger/Extender: [name] in Treatment: 5 Wound Status Wound Number: 4 Primary Venous Leg Ulcer Etiology: Wound Location: Right Lower Leg - Medial Wound Status: Open Wounding Event: Gradually Appeared Comorbid Cataracts, Arrhythmia, Hypertension, Date Acquired: 02/16/2017 History: Osteoarthritis Weeks Of Treatment: 4 Clustered Wound: No Wound Measurements Length: (cm) 7.5 Width: (cm) 5.2 Depth: (cm) 0.1 Area: (cm) 30.631 Volume: (cm) 3.063 % Reduction in Area: -19410.2% % Reduction in Volume: -19043.7% Epithelialization: None Tunneling: No Undermining: No Wound Description Classification: Partial Thickness Wound Margin: Distinct, outline attached Exudate Amount: Large Exudate Type: Serosanguineous Exudate Color: red, brown Foul Odor After Cleansing: No Slough/Fibrino Yes Wound Bed Granulation Amount: Small (1-33%) Exposed Structure Granulation Quality: Pink, Pale Fascia Exposed: No Necrotic Amount: Large (67-100%) Fat Layer (Subcutaneous Tissue) Exposed: Yes Necrotic Quality:  Adherent Slough Tendon Exposed: No Muscle Exposed: No Joint Exposed: No Bone Exposed: No Periwound Skin Texture Texture Color No Abnormalities Noted: No No Abnormalities Noted: No Callus: No Atrophie Blanche: No Crepitus: No Cyanosis: No Excoriation: No Ecchymosis: No Induration: No Erythema: No Rash: No Hemosiderin Staining: Yes Geremia, Kiaraliz J. ([id number]) Scarring: No Mottled: Yes Pallor: No Moisture Rubor: No No Abnormalities Noted: No Dry / Scaly: No Temperature / Pain Maceration: No Temperature: No Abnormality Tenderness on Palpation: Yes Wound Preparation Ulcer Cleansing: Other: surg scrub and water, Topical Anesthetic Applied: Other: lidocaine 4%, Electronic Signature(s) Signed: 03/16/2017 5:35:15 PM By: Jordan Gurney, RN, Hopkins, [name] Entered By: Jordan Gurney, RN, Hopkins, Jordan on 03/16/2017 16:30:10 Gamm, [name] ([id number]) -------------------------------------------------------------------------------- Vitals Details [name] Date  of Service: 03/16/2017 3:45 PM Medical Record Number: [id number] Patient Account Number: [id number] Date of Birth/Sex: 1931/07/25 (83 y.o. Female) Treating RN: [name] Primary Care Khrystian Schauf: [name] Other Clinician: Referring Lillyonna Armstead: [name] Treating Shizuo Biskup/Extender: [name] in Treatment: 5 Vital Signs Time Taken: 15:22 Temperature (F): 98.5 Height (in): 66 Pulse (bpm): 81 Weight (lbs): 159 Respiratory Rate (breaths/min): 16 Body Mass Index (BMI): 25.7 Blood Pressure (mmHg): 149/74 Reference Range: 80 - 120 mg / dl Electronic Signature(s) Signed: 03/16/2017 5:35:15 PM By: Jordan Gurney, RN, Hopkins, [name] Entered By: Jordan Gurney, RN, Hopkins, Jordan on 03/16/2017 16:22:40

## 2017-03-22 ENCOUNTER — Other Ambulatory Visit
Admission: RE | Admit: 2017-03-22 | Discharge: 2017-03-22 | Disposition: A | Discharge status: Home / Self Care | Payer: Medicare Other | Source: Ambulatory Visit | Attending: Cardiovascular Disease | Admitting: Cardiovascular Disease

## 2017-03-22 DIAGNOSIS — Z01812 Encounter for preprocedural laboratory examination: Secondary | ICD-10-CM | POA: Insufficient documentation

## 2017-03-22 DIAGNOSIS — E860 Dehydration: Secondary | ICD-10-CM

## 2017-03-22 LAB — BASIC METABOLIC PANEL
Anion gap: 11 (ref 5–15)
BUN: 36 mg/dL — ABNORMAL HIGH (ref 6–20)
CALCIUM: 10.2 mg/dL (ref 8.9–10.3)
CO2: 25 mmol/L (ref 22–32)
CREATININE: 1.55 mg/dL — AB (ref 0.44–1.00)
Chloride: 97 mmol/L — ABNORMAL LOW (ref 101–111)
GFR calc non Af Amer: 30 mL/min — ABNORMAL LOW (ref >60)
GFR, EST AFRICAN AMERICAN: 35 mL/min — AB (ref >60)
GLUCOSE: 106 mg/dL — AB (ref 65–99)
Potassium: 3.9 mmol/L (ref 3.5–5.1)
Sodium: 133 mmol/L — ABNORMAL LOW (ref 135–145)

## 2017-03-22 LAB — CBC
HCT: 37.3 % (ref 35.0–47.0)
Hemoglobin: 13 g/dL (ref 12.0–16.0)
MCH: 32.2 pg (ref 26.0–34.0)
MCHC: 34.8 g/dL (ref 32.0–36.0)
MCV: 92.7 fL (ref 80.0–100.0)
PLATELETS: 167 10*3/uL (ref 150–440)
RBC: 4.03 MIL/uL (ref 3.80–5.20)
RDW: 13.9 % (ref 11.5–14.5)
WBC: 10.1 10*3/uL (ref 3.6–11.0)

## 2017-03-22 LAB — PROTIME-INR
INR: 1.14
PROTHROMBIN TIME: 14.7 s (ref 11.4–15.2)

## 2017-03-23 ENCOUNTER — Other Ambulatory Visit: Payer: Self-pay | Admitting: Internal Medicine

## 2017-03-23 ENCOUNTER — Other Ambulatory Visit: Payer: Self-pay

## 2017-03-23 NOTE — Telephone Encounter (Signed)
Pt daughter is asking if we can call her back  She has some questions about the cath in the morning  It is about her medications Wants to asks if she is allowed to take her Asprin and just to make sure she is not to take her tramadol but is to take all her other medications.  Please advise   If we can't reach her on her cell we can try her work number    [phone]

## 2017-03-23 NOTE — Telephone Encounter (Signed)
Returned call to pt's daughter, Loa Socks who asked to review pre-procedure instructions. Julene understands that pt is to hold maxide today and tomorrow and await further instructions post-procedure. She confirmed pt took potassium x 1 week based on 3/29 labs. Potassium improved to 3.9 per 4/9 labs. Medication list updated.

## 2017-03-24 ENCOUNTER — Ambulatory Visit (HOSPITAL_COMMUNITY)
Admission: RE | Admit: 2017-03-24 | Discharge: 2017-03-25 | Disposition: A | Discharge status: Home / Self Care | Payer: Medicare Other | Source: Ambulatory Visit | Attending: Cardiovascular Disease | Admitting: Cardiovascular Disease

## 2017-03-24 ENCOUNTER — Encounter (HOSPITAL_COMMUNITY)
Admission: RE | Disposition: A | Discharge status: Home / Self Care | Payer: Self-pay | Source: Ambulatory Visit | Attending: Cardiovascular Disease

## 2017-03-24 ENCOUNTER — Other Ambulatory Visit: Payer: Self-pay | Admitting: Internal Medicine

## 2017-03-24 ENCOUNTER — Encounter (HOSPITAL_COMMUNITY): Payer: Self-pay | Admitting: General Practice

## 2017-03-24 DIAGNOSIS — I70211 Atherosclerosis of native arteries of extremities with intermittent claudication, right leg: Secondary | ICD-10-CM | POA: Diagnosis not present

## 2017-03-24 DIAGNOSIS — I70248 Atherosclerosis of native arteries of left leg with ulceration of other part of lower left leg: Secondary | ICD-10-CM | POA: Insufficient documentation

## 2017-03-24 DIAGNOSIS — I998 Other disorder of circulatory system: Secondary | ICD-10-CM | POA: Diagnosis present

## 2017-03-24 DIAGNOSIS — I70229 Atherosclerosis of native arteries of extremities with rest pain, unspecified extremity: Secondary | ICD-10-CM | POA: Diagnosis present

## 2017-03-24 DIAGNOSIS — Z882 Allergy status to sulfonamides status: Secondary | ICD-10-CM | POA: Diagnosis not present

## 2017-03-24 DIAGNOSIS — L97311 Non-pressure chronic ulcer of right ankle limited to breakdown of skin: Secondary | ICD-10-CM | POA: Insufficient documentation

## 2017-03-24 DIAGNOSIS — I70233 Atherosclerosis of native arteries of right leg with ulceration of ankle: Secondary | ICD-10-CM | POA: Insufficient documentation

## 2017-03-24 DIAGNOSIS — I7389 Other specified peripheral vascular diseases: Secondary | ICD-10-CM | POA: Insufficient documentation

## 2017-03-24 DIAGNOSIS — M62261 Nontraumatic ischemic infarction of muscle, right lower leg: Secondary | ICD-10-CM | POA: Insufficient documentation

## 2017-03-24 DIAGNOSIS — L97828 Non-pressure chronic ulcer of other part of left lower leg with other specified severity: Secondary | ICD-10-CM | POA: Insufficient documentation

## 2017-03-24 DIAGNOSIS — Z79899 Other long term (current) drug therapy: Secondary | ICD-10-CM | POA: Diagnosis not present

## 2017-03-24 DIAGNOSIS — Z7982 Long term (current) use of aspirin: Secondary | ICD-10-CM | POA: Insufficient documentation

## 2017-03-24 DIAGNOSIS — I739 Peripheral vascular disease, unspecified: Secondary | ICD-10-CM

## 2017-03-24 DIAGNOSIS — R42 Dizziness and giddiness: Secondary | ICD-10-CM | POA: Diagnosis not present

## 2017-03-24 DIAGNOSIS — I10 Essential (primary) hypertension: Secondary | ICD-10-CM | POA: Insufficient documentation

## 2017-03-24 DIAGNOSIS — Z7902 Long term (current) use of antithrombotics/antiplatelets: Secondary | ICD-10-CM | POA: Diagnosis not present

## 2017-03-24 DIAGNOSIS — M62262 Nontraumatic ischemic infarction of muscle, left lower leg: Secondary | ICD-10-CM | POA: Insufficient documentation

## 2017-03-24 DIAGNOSIS — Z96652 Presence of left artificial knee joint: Secondary | ICD-10-CM | POA: Insufficient documentation

## 2017-03-24 HISTORY — PX: PERIPHERAL VASCULAR ATHERECTOMY: CATH118256

## 2017-03-24 HISTORY — DX: Unspecified osteoarthritis, unspecified site: M19.90

## 2017-03-24 LAB — BASIC METABOLIC PANEL
ANION GAP: 12 (ref 5–15)
BUN: 34 mg/dL — ABNORMAL HIGH (ref 6–20)
CALCIUM: 10.2 mg/dL (ref 8.9–10.3)
CO2: 24 mmol/L (ref 22–32)
CREATININE: 1.59 mg/dL — AB (ref 0.44–1.00)
Chloride: 95 mmol/L — ABNORMAL LOW (ref 101–111)
GFR, EST AFRICAN AMERICAN: 34 mL/min — AB (ref >60)
GFR, EST NON AFRICAN AMERICAN: 29 mL/min — AB (ref >60)
Glucose, Bld: 118 mg/dL — ABNORMAL HIGH (ref 65–99)
Potassium: 4.5 mmol/L (ref 3.5–5.1)
Sodium: 131 mmol/L — ABNORMAL LOW (ref 135–145)

## 2017-03-24 LAB — POCT ACTIVATED CLOTTING TIME
ACTIVATED CLOTTING TIME: 213 s
Activated Clotting Time: 224 seconds
Activated Clotting Time: 235 seconds
Activated Clotting Time: 202 seconds
Activated Clotting Time: 169 seconds
Activated Clotting Time: 186 seconds

## 2017-03-24 SURGERY — PERIPHERAL VASCULAR ATHERECTOMY
Anesthesia: LOCAL | Laterality: Right

## 2017-03-24 MED ORDER — ACETAMINOPHEN 500 MG PO TABS: 1000.0000 mg | ORAL_TABLET | Freq: Two times a day (BID) | ORAL | Status: DC | PRN

## 2017-03-24 MED ORDER — HEPARIN (PORCINE) IN NACL 2-0.9 UNIT/ML-% IJ SOLN
INTRAMUSCULAR | Status: AC
  Filled 2017-03-24: qty 500

## 2017-03-24 MED ORDER — LIDOCAINE HCL (PF) 1 % IJ SOLN
INTRAMUSCULAR | Status: AC
  Filled 2017-03-24: qty 30

## 2017-03-24 MED ORDER — SODIUM CHLORIDE 0.9 % IV SOLN: 250.0000 mL | INTRAVENOUS | Status: DC | PRN

## 2017-03-24 MED ORDER — IODIXANOL 320 MG/ML IV SOLN
INTRAVENOUS | Status: DC | PRN
  Administered 2017-03-24: 50 mL via INTRA_ARTERIAL

## 2017-03-24 MED ORDER — SODIUM CHLORIDE 0.9% FLUSH: 3.0000 mL | Freq: Two times a day (BID) | INTRAVENOUS | Status: DC

## 2017-03-24 MED ORDER — ATORVASTATIN CALCIUM 20 MG PO TABS
20.0000 mg | ORAL_TABLET | Freq: Every day | ORAL | Status: DC
  Administered 2017-03-24: 20 mg via ORAL
  Filled 2017-03-24: qty 1

## 2017-03-24 MED ORDER — ASPIRIN EC 81 MG PO TBEC
81.0000 mg | DELAYED_RELEASE_TABLET | Freq: Every day | ORAL | Status: DC
  Administered 2017-03-25: 10:00:00 81 mg via ORAL
  Filled 2017-03-24: qty 1

## 2017-03-24 MED ORDER — VITAMIN D3 25 MCG (1000 UNIT) PO TABS
1000.0000 [IU] | ORAL_TABLET | Freq: Every evening | ORAL | Status: DC
  Administered 2017-03-24: 1000 [IU] via ORAL
  Filled 2017-03-24 (×3): qty 1

## 2017-03-24 MED ORDER — VERAPAMIL HCL 2.5 MG/ML IV SOLN
INTRAVENOUS | Status: AC
  Filled 2017-03-24: qty 2

## 2017-03-24 MED ORDER — ONDANSETRON HCL 4 MG/2ML IJ SOLN: 4.0000 mg | Freq: Four times a day (QID) | INTRAMUSCULAR | Status: DC | PRN

## 2017-03-24 MED ORDER — ENSURE ENLIVE PO LIQD
237.0000 mL | Freq: Two times a day (BID) | ORAL | Status: DC
  Administered 2017-03-24 – 2017-03-25 (×2): 237 mL via ORAL
  Filled 2017-03-24 (×5): qty 237

## 2017-03-24 MED ORDER — VIPERSLIDE LUBRICANT OPTIME
TOPICAL | Status: DC | PRN
  Administered 2017-03-24: 11:00:00 via SURGICAL_CAVITY

## 2017-03-24 MED ORDER — MIDAZOLAM HCL 2 MG/2ML IJ SOLN
INTRAMUSCULAR | Status: AC
  Filled 2017-03-24: qty 2

## 2017-03-24 MED ORDER — FENTANYL CITRATE (PF) 100 MCG/2ML IJ SOLN
INTRAMUSCULAR | Status: DC | PRN
  Administered 2017-03-24 (×2): 25 ug via INTRAVENOUS
  Administered 2017-03-24: 50 ug via INTRAVENOUS

## 2017-03-24 MED ORDER — AMLODIPINE BESYLATE 5 MG PO TABS
10.0000 mg | ORAL_TABLET | Freq: Every day | ORAL | Status: DC
  Administered 2017-03-25: 10 mg via ORAL
  Filled 2017-03-24: qty 2

## 2017-03-24 MED ORDER — NITROGLYCERIN 1 MG/10 ML FOR IR/CATH LAB
INTRA_ARTERIAL | Status: DC | PRN
  Administered 2017-03-24: 300 ug via INTRA_ARTERIAL

## 2017-03-24 MED ORDER — SODIUM CHLORIDE 0.9% FLUSH: 3.0000 mL | INTRAVENOUS | Status: DC | PRN

## 2017-03-24 MED ORDER — NITROGLYCERIN IN D5W 200-5 MCG/ML-% IV SOLN
INTRAVENOUS | Status: AC
  Filled 2017-03-24: qty 250

## 2017-03-24 MED ORDER — FENTANYL CITRATE (PF) 100 MCG/2ML IJ SOLN
INTRAMUSCULAR | Status: AC
  Filled 2017-03-24: qty 2

## 2017-03-24 MED ORDER — SODIUM CHLORIDE 0.9 % IV SOLN
INTRAVENOUS | Status: DC | PRN
  Administered 2017-03-24: 20 mL/h via INTRAVENOUS

## 2017-03-24 MED ORDER — SODIUM CHLORIDE 0.9 % IV SOLN
INTRAVENOUS | Status: DC
  Administered 2017-03-24: 10:00:00 via INTRAVENOUS

## 2017-03-24 MED ORDER — LABETALOL HCL 100 MG PO TABS
100.0000 mg | ORAL_TABLET | Freq: Two times a day (BID) | ORAL | Status: DC
  Administered 2017-03-25: 10:00:00 100 mg via ORAL
  Filled 2017-03-24 (×2): qty 1

## 2017-03-24 MED ORDER — SODIUM CHLORIDE 0.9 % IV SOLN
INTRAVENOUS | Status: AC
  Administered 2017-03-24: 19:00:00 via INTRAVENOUS

## 2017-03-24 MED ORDER — BIOTIN 1000 MCG PO TABS: 1000.0000 ug | ORAL_TABLET | Freq: Every evening | ORAL | Status: DC

## 2017-03-24 MED ORDER — LIDOCAINE HCL (PF) 1 % IJ SOLN
INTRAMUSCULAR | Status: DC | PRN
  Administered 2017-03-24: 14 mL via INTRADERMAL

## 2017-03-24 MED ORDER — HEPARIN SODIUM (PORCINE) 1000 UNIT/ML IJ SOLN
INTRAMUSCULAR | Status: AC
  Filled 2017-03-24: qty 1

## 2017-03-24 MED ORDER — ASPIRIN 81 MG PO CHEW: 81.0000 mg | CHEWABLE_TABLET | ORAL | Status: DC

## 2017-03-24 MED ORDER — MIDAZOLAM HCL 2 MG/2ML IJ SOLN
INTRAMUSCULAR | Status: DC | PRN
  Administered 2017-03-24 (×2): 1 mg via INTRAVENOUS

## 2017-03-24 MED ORDER — ANGIOPLASTY BOOK
Freq: Once | Status: AC
  Administered 2017-03-24: 21:00:00
  Filled 2017-03-24: qty 1

## 2017-03-24 MED ORDER — TRAMADOL HCL 50 MG PO TABS
50.0000 mg | ORAL_TABLET | Freq: Two times a day (BID) | ORAL | Status: DC | PRN
  Administered 2017-03-24 – 2017-03-25 (×2): 50 mg via ORAL
  Filled 2017-03-24 (×2): qty 1

## 2017-03-24 MED ORDER — CLOPIDOGREL BISULFATE 75 MG PO TABS
75.0000 mg | ORAL_TABLET | Freq: Every day | ORAL | Status: DC
  Administered 2017-03-25: 10:00:00 75 mg via ORAL
  Filled 2017-03-24: qty 1

## 2017-03-24 MED ORDER — HEPARIN SODIUM (PORCINE) 1000 UNIT/ML IJ SOLN
INTRAMUSCULAR | Status: DC | PRN
Start: 2017-03-24 — End: 2017-03-24
  Administered 2017-03-24: 6000 [IU] via INTRAVENOUS
  Administered 2017-03-24: 2000 [IU] via INTRAVENOUS

## 2017-03-24 MED ORDER — HEPARIN (PORCINE) IN NACL 2-0.9 UNIT/ML-% IJ SOLN
INTRAMUSCULAR | Status: DC | PRN
  Administered 2017-03-24: 1000 mL

## 2017-03-24 SURGICAL SUPPLY — 22 items
BAG SNAP BAND KOVER 36X36 (MISCELLANEOUS) ×2 IMPLANT
BALLN ARMADA 3.0X120X150 (BALLOONS) ×2
BALLN COYOTE OTW 4X100X150 (BALLOONS) ×2
BALLOON ARMADA 3.0X120X150 (BALLOONS) ×1 IMPLANT
BALLOON COYOTE OTW 4X100X150 (BALLOONS) ×1 IMPLANT
CATH CROSS OVER TEMPO 5F (CATHETERS) ×2 IMPLANT
CATH QUICKCROSS .018X135CM (MICROCATHETER) ×2 IMPLANT
CROWN STEALTH MICRO-30 1.25MM (CATHETERS) ×2 IMPLANT
KIT ENCORE 26 ADVANTAGE (KITS) ×2 IMPLANT
KIT MICROINTRODUCER STIFF 5F (SHEATH) ×2 IMPLANT
KIT PV (KITS) ×2 IMPLANT
LUBRICANT VIPERSLIDE CORONARY (MISCELLANEOUS) ×2 IMPLANT
SHEATH PINNACLE MP 6F 45CM (SHEATH) ×2 IMPLANT
SHIELD RADPAD SCOOP 12X17 (MISCELLANEOUS) ×2 IMPLANT
STOPCOCK MORSE 400PSI 3WAY (MISCELLANEOUS) ×2 IMPLANT
TAPE RADIOPAQUE TURBO (MISCELLANEOUS) ×2 IMPLANT
TRANSDUCER W/STOPCOCK (MISCELLANEOUS) ×2 IMPLANT
TRAY PV CATH (CUSTOM PROCEDURE TRAY) ×2 IMPLANT
TUBING CIL FLEX 10 FLL-RA (TUBING) ×2 IMPLANT
WIRE HITORQ VERSACORE ST 145CM (WIRE) ×2 IMPLANT
WIRE RUNTHROUGH .014X300CM (WIRE) ×2 IMPLANT
WIRE VIPER ADVANCE .017X335CM (WIRE) ×2 IMPLANT

## 2017-03-24 NOTE — Progress Notes (Signed)
Site area: Lt fem arData processing manager to Removal:  Level 0 Pressure Applied For: Manual:   yes Patient Status During Pull:  A/O Post Pull Site:  Level 0 Post Pull Instructions Given:  Yes and pt understands Post Pull Pulses Present: lt dp faint Dressing Applied:  tegaderm  and a 4x4 Bedrest begins @ 14:30:00 Comments: Pt leaves cath lab holding in stable condition. Lt groin unremarkable. Dressing is CDI.

## 2017-03-24 NOTE — Care Management Note (Addendum)
Case Management Note  Patient Details  Name: YOKO MCGAHEE MRN: [id number] Date of Birth: [birth year]  Subjective/Objective:  From home s/p  Peripheral Vascular Atherectomy, will be on plavix, NCM will cont to follow for dc needs.   Patient is active  With [facility] for [facility] . Lupita Leash with [facility] notified.              Action/Plan:   Expected Discharge Date:                  Expected Discharge Plan:  Home w Home Health Services  In-House Referral:     Discharge planning Services  CM Consult  Post Acute Care Choice:    Choice offered to:     DME Arranged:    DME Agency:     HH Arranged:    HH Agency:     Status of Service:  In process, will continue to follow  If discussed at Long Length of Stay Meetings, dates discussed:    Additional Comments:  Leone Haven, RN 03/24/2017, 3:43 PM

## 2017-03-24 NOTE — Interval H&P Note (Signed)
History and Physical Interval Note:  03/24/2017 10:21 AM  [name]  has presented today for surgery, with the diagnosis of pad  The various methods of treatment have been discussed with the patient and family. After consideration of risks, benefits and other options for treatment, the patient has consented to  Procedure(s): Peripheral Vascular Atherectomy (Right) as a surgical intervention .  The patient's history has been reviewed, patient examined, no change in status, stable for surgery.  I have reviewed the patient's chart and labs.  Questions were answered to the patient's satisfaction.     Lorine Bears

## 2017-03-24 NOTE — Discharge Summary (Signed)
Discharge Summary    Patient ID: [name],  MRN: [id number], DOB/AGE: Nov 30, 1931 83 y.o.  Admit date: 03/24/2017 Discharge date: 03/25/2017  Primary Care Provider: Guy Begin Primary Cardiologist: Kirke Corin  Discharge Diagnoses    Active Problems:   Critical lower limb ischemia  Allergies Allergies  Allergen Reactions  . Sulfa Antibiotics Shortness Of Breath and Other (See Comments)    dizziness    Diagnostic Studies/Procedures    PVA: 03/24/17  Conclusion   Successful orbital atherectomy and balloon angioplasty of the right distal popliteal artery, TP trunk and peroneal artery.  Recommendations: Continue dual antiplatelet therapy. Given chronic kidney disease, hydrate overnight and recheck renal function in the morning. Obtain an ABI and right lower extremity duplex within 2-3 weeks.  _____________    History of Present Illness     83 year old female with past medical history of PAD, hypertension, and arthritis who is followed by Dr. Kirke Corin. She was recently seen for recurrent ulcerations on both legs. Noted to have critical limb ischemia with significant disease below the knee bilaterally. Had an angiogram done by Dr. Kirke Corin showing diffuse SFA and popliteal disease with significant stenosis affecting the TP trunk just before the origin of the peritoneal artery and the right lower extremity, and left lower extremity with moderate SFA stenosis with occluded popliteal artery and tibial peritoneal vessels with visible collaterals. It was determined that she should undergo endovascular intervention on the right lower extremity using an orbital atherectomy and angioplasty. She was started on Plavix 75 mg daily last office visit on 03/11/17. Of note it was felt there were no revascularization options for the left lower extremity, and hopeful improvement with conservative measures.  Hospital Course     She presented on 03/24/17 for peripheral vascular arthrectomy, and had  successful orbital atherectomy and balloon angioplasty of the right distal popliteal artery, with TP trunk and peritoneal arteries. Recommended to continue with dual antiplatelet therapy with aspirin and Plavix. Given history of chronic kidney disease she was admitted and hydrated overnight with renal function checked the following morning. Plan for outpatient ABIs and right lower extremity duplex within the next 2-3 weeks. Morning labs showed stable Cr, with K+ 3.2 that was replaced prior to discharge. Low dose statin was added this admission.   She was seen and assessed by Dr. Allyson Sabal on 03/25/17 and determined stable for discharge home.   Physical Exam:    GEN: Well nourished, well developed, in no acute distress  HEENT: normal  Neck: no JVD, carotid bruits, or masses Cardiac: RRR; no murmurs, rubs, or gallops,no edema  Respiratory:  clear to auscultation bilaterally, normal work of breathing GI: soft, nontender, nondistended, + BS MS: no deformity or atrophy. Left femoral cath site stable without bruising or hematoma. DP pulses noted with doppler Skin: warm and dry, no rash Neuro:  Strength and sensation are intact Psych: euthymic mood, full affect _____________  Discharge Vitals Blood pressure (!) 112/45, pulse 69, temperature 98.1 F (36.7 C), temperature source Oral, resp. rate 14, height  (1.651 m), weight 133 lb 6.1 oz (60.5 kg), SpO2 99 %.  Filed Weights   03/24/17 0837 03/25/17 0428  Weight: 143 lb (64.9 kg) 133 lb 6.1 oz (60.5 kg)    Labs & Radiologic Studies    CBC  Recent Labs  03/22/17 1024  WBC 10.1  HGB 13.0  HCT 37.3  MCV 92.7  PLT 167   Basic Metabolic Panel  Recent Labs  03/24/17 0846 03/25/17  0131  NA 131* 132*  K 4.5 3.2*  CL 95* 100*  CO2 24 23  GLUCOSE 118* 91  BUN 34* 25*  CREATININE 1.59* 1.07*  CALCIUM 10.2 8.9   Liver Function Tests No results for input(s): AST, ALT, ALKPHOS, BILITOT, PROT, ALBUMIN in the last 72 hours. No results  for input(s): LIPASE, AMYLASE in the last 72 hours. Cardiac Enzymes No results for input(s): CKTOTAL, CKMB, CKMBINDEX, TROPONINI in the last 72 hours. BNP Invalid input(s): POCBNP D-Dimer No results for input(s): DDIMER in the last 72 hours. Hemoglobin A1C No results for input(s): HGBA1C in the last 72 hours. Fasting Lipid Panel No results for input(s): CHOL, HDL, LDLCALC, TRIG, CHOLHDL, LDLDIRECT in the last 72 hours. Thyroid Function Tests No results for input(s): TSH, T4TOTAL, T3FREE, THYROIDAB in the last 72 hours.  Invalid input(s): FREET3 _____________  No results found. Disposition   Pt is being discharged home today in good condition.  Follow-up Plans & Appointments    Follow-up Information    Lorine Bears, MD Follow up on 04/20/2017.   Specialty:  Cardiology Why:  at 3:40pm for your follow up appt.  Contact information: [address] [place] [address] [phone]        [facility] Follow up on 04/14/2017.   Specialty:  Cardiology Why:  at 3:30pm for your follow up dopplers Contact information: [address] [place] [address] [phone]         Discharge Instructions    Call MD for:  redness, tenderness, or signs of infection (pain, swelling, redness, odor or green/yellow discharge around incision site)    Complete by:  As directed    Diet - low sodium heart healthy    Complete by:  As directed    Discharge instructions    Complete by:  As directed    Groin Site Care Refer to this sheet in the next few weeks. These instructions provide you with information on caring for yourself after your procedure. Your caregiver may also give you more specific instructions. Your treatment has been planned according to current medical practices, but problems sometimes occur. Call your caregiver if you have any problems or questions after your procedure. HOME CARE INSTRUCTIONS You may shower 24 hours  after the procedure. Remove the bandage (dressing) and gently wash the site with plain soap and water. Gently pat the site dry.  Do not apply powder or lotion to the site.  Do not sit in a bathtub, swimming pool, or whirlpool for 5 to 7 days.  No bending, squatting, or lifting anything over 10 pounds (4.5 kg) as directed by your caregiver.  Inspect the site at least twice daily.  Do not drive home if you are discharged the same day of the procedure. Have someone else drive you.  You may drive 24 hours after the procedure unless otherwise instructed by your caregiver.  What to expect: Any bruising will usually fade within 1 to 2 weeks.  Blood that collects in the tissue (hematoma) may be painful to the touch. It should usually decrease in size and tenderness within 1 to 2 weeks.  SEEK IMMEDIATE MEDICAL CARE IF: You have unusual pain at the groin site or down the affected leg.  You have redness, warmth, swelling, or pain at the groin site.  You have drainage (other than a small amount of blood on the dressing).  You have chills.  You have a fever or persistent symptoms for  more than 72 hours.  You have a fever and your symptoms suddenly get worse.  Your leg becomes pale, cool, tingly, or numb.  You have heavy bleeding from the site. Hold pressure on the site. .   Increase activity slowly    Complete by:  As directed       Discharge Medications   Current Discharge Medication List    START taking these medications   Details  atorvastatin (LIPITOR) 20 MG tablet Take 1 tablet (20 mg total) by mouth daily at 6 PM. Qty: 30 tablet, Refills: 6      CONTINUE these medications which have NOT CHANGED   Details  acetaminophen (TYLENOL) 500 MG tablet Take 1,000 mg by mouth 2 (two) times daily as needed for moderate pain or headache.    amLODipine (NORVASC) 10 MG tablet Take 10 mg by mouth daily.  Refills: 5    aspirin EC 81 MG tablet Take 81 mg by mouth daily.    Biotin 1000 MCG tablet  Take 1,000 mcg by mouth every evening.    Calcium Carbonate-Vitamin D (CALCIUM 600+D PO) Take 1 tablet by mouth every evening.    cholecalciferol (VITAMIN D) 1000 units tablet Take 1,000 Units by mouth every evening.     clopidogrel (PLAVIX) 75 MG tablet Take 1 tablet (75 mg total) by mouth daily. Qty: 30 tablet, Refills: 3    labetalol (NORMODYNE) 100 MG tablet Take 100 mg by mouth 2 (two) times daily.  Refills: 5    traMADol (ULTRAM) 50 MG tablet Take 50 mg by mouth 2 (two) times daily as needed for moderate pain.    triamterene-hydrochlorothiazide (MAXZIDE-25) 37.5-25 MG per tablet Take 1 tablet by mouth daily. Refills: 5         Outstanding Labs/Studies   ABI/LE dopplers in 2/3 weeks. LFTs/FLP in 6 weeks if tolerating statin.   Duration of Discharge Encounter   Greater than 30 minutes including physician time.  Signed, Laverda Page NP-C 03/25/2017, 9:08 AM

## 2017-03-24 NOTE — H&P (View-Only) (Signed)
Cardiology Office Note   Date:  03/11/2017   ID:  [name], DOB [birth year], MRN [id number]  PCP:  Guy Begin, MD  Cardiologist:   Lorine Bears, MD   Chief Complaint  Patient presents with  . other    1 month follow up post abdominal aortogram with lower extremity angiography. Pt. c/o bilateral ankle sores with legs burning.       History of Present Illness: TAHIRY Hopkins is a 83 y.o. female who is here today for a follow-up visit regarding peripheral arterial disease. The patient has no history of diabetes or tobacco use. She is known to have hypertension. She is known to have peripheral arterial disease with significantly abnormal ABI. The patient was seen recently for recurrent ulceration on both legs.  I proceeded with angiography recently which showed: 1. No significant aortoiliac disease. 2. Right lower extremity: Mild diffuse SFA and popliteal disease with significant stenosis affecting the TP trunk just before the origin of the peroneal artery which is the only patent vessel below the knee. 3. Left lower extremity: Moderate left SFA stenosis with occluded popliteal artery and tibial peroneal vessels with only visible collaterals and short segments of reconstitution.  No revascularization options of the left lower extremity. She has no targets for bypass and no options for endovascular intervention given that there is no reconstitution of her tibial peroneal vessels.  The left lower extremity wound appears to be small and might be improving. However, the wound on the right lower extremity showed no improvement at all.   Past Medical History:  Diagnosis Date  . Dizziness   . Hypertension     Past Surgical History:  Procedure Laterality Date  . ABDOMINAL AORTOGRAM N/A 02/24/2017   Procedure: Abdominal Aortogram;  Surgeon: Iran Ouch, MD;  Location: [facility];  Service: Cardiovascular;  Laterality: N/A;  . HERNIA REPAIR    . JOINT REPLACEMENT      left knee  . LOWER EXTREMITY ANGIOGRAPHY Bilateral 02/24/2017   Procedure: Lower Extremity Angiography;  Surgeon: Iran Ouch, MD;  Location: [facility];  Service: Cardiovascular;  Laterality: Bilateral;     Current Outpatient Prescriptions  Medication Sig Dispense Refill  . acetaminophen (TYLENOL) 500 MG tablet Take 1,000 mg by mouth 2 (two) times daily as needed for moderate pain or headache.    Marland Kitchen amLODipine (NORVASC) 10 MG tablet Take 10 mg by mouth daily.   5  . aspirin EC 81 MG tablet Take 81 mg by mouth daily.    . Biotin 1000 MCG tablet Take 1,000 mcg by mouth every evening.    . Calcium Carbonate-Vitamin D (CALCIUM 600+D PO) Take 1 tablet by mouth every evening.    . cholecalciferol (VITAMIN D) 1000 units tablet Take 1,000 Units by mouth every evening.     . labetalol (NORMODYNE) 100 MG tablet Take 100 mg by mouth 2 (two) times daily.   5  . traMADol (ULTRAM) 50 MG tablet Take 50 mg by mouth 2 (two) times daily as needed for moderate pain.    Marland Kitchen triamterene-hydrochlorothiazide (MAXZIDE-25) 37.5-25 MG per tablet Take 1 tablet by mouth daily.  5  . clopidogrel (PLAVIX) 75 MG tablet Take 1 tablet (75 mg total) by mouth daily. 30 tablet 3   No current facility-administered medications for this visit.     Allergies:   Sulfa antibiotics    Social History:  The patient  reports that she has never smoked. She has  never used smokeless tobacco. She reports that she does not drink alcohol or use drugs.   Family History:  The patient's Family history is negative for coronary artery disease.   ROS:  Please see the history of present illness.   Otherwise, review of systems are positive for none.   All other systems are reviewed and negative.    PHYSICAL EXAM: VS:  BP (!) 142/68 (BP Location: Right Arm, Patient Position: Sitting, Cuff Size: Normal)   Pulse 70   Ht  (1.651 m)   Wt 138 lb (62.6 kg)   BMI 22.96 kg/m  , BMI Body mass index is 22.96 kg/m. GEN: Well  nourished, well developed, in no acute distress  HEENT: normal  Neck: no JVD, carotid bruits, or masses Cardiac: RRR; no murmurs, rubs, or gallops,no edema  Respiratory:  clear to auscultation bilaterally, normal work of breathing GI: soft, nontender, nondistended, + BS MS: no deformity or atrophy  Skin: warm and dry, no rash Neuro:  Strength and sensation are intact Psych: euthymic mood, full affect The wounds were examined today. there is a 3 x 2 cm wound on the right lateral malleolus. This wound is deep. There is a 1 x 1 cm wound on the left medial lower leg. This wound is superficial. There is also a large superficial on the right medial ankle  EKG:  EKG is ordered today. EKG showed normal sinus rhythm with first-degree AV block. Nonspecific ST and T wave changes.   Recent Labs: 06/28/2016: ALT 17 06/30/2016: Magnesium 1.8 02/19/2017: BUN 20; Creatinine, Ser 1.03; Hemoglobin 12.5; Platelets 175; Potassium 3.6; Sodium 137    Lipid Panel No results found for: CHOL, TRIG, HDL, CHOLHDL, VLDL, LDLCALC, LDLDIRECT    Wt Readings from Last 3 Encounters:  03/11/17 138 lb (62.6 kg)  02/24/17 143 lb (64.9 kg)  02/19/17 143 lb 8 oz (65.1 kg)         ASSESSMENT AND PLAN:  1. Peripheral arterial disease: The patient has critical limb ischemia with significant disease below the knee bilaterally.  I reviewed the angiogram and details today with the patient and family. I explained to them that there are no revascularization options to the left lower extremity and I already discussed the case with Dr. Randie Heinz for a second opinion. The wound in the left lower extremity is actually small and hopefully it can improve with conservative measures. If she develops further ischemic complications on that side, she might require amputation.  The wounds on the right leg are more concerning and much deeper with no significant improvement. She does have significant disease in the TP trunk into the peroneal  artery which is patent to the foot and gives collaterals to the pedal loop. Due to that, I recommend receding with endovascular intervention on the right lower extremity using orbital atherectomy and angioplasty. I discussed the procedure in details as well as risks and benefits. I'm going to start Plavix 75 mg once daily.  2. Essential hypertension: Blood pressure is reasonably controlled.  Disposition:   FU with me in 1 month.  Signed,  Lorine Bears, MD  03/11/2017 4:40 PM    [facility]

## 2017-03-25 ENCOUNTER — Other Ambulatory Visit: Payer: Self-pay | Admitting: Internal Medicine

## 2017-03-25 ENCOUNTER — Other Ambulatory Visit: Payer: Self-pay | Admitting: Cardiology

## 2017-03-25 ENCOUNTER — Encounter (HOSPITAL_COMMUNITY): Payer: Self-pay | Admitting: Cardiovascular Disease

## 2017-03-25 DIAGNOSIS — I739 Peripheral vascular disease, unspecified: Secondary | ICD-10-CM

## 2017-03-25 DIAGNOSIS — M62261 Nontraumatic ischemic infarction of muscle, right lower leg: Secondary | ICD-10-CM | POA: Diagnosis not present

## 2017-03-25 DIAGNOSIS — I998 Other disorder of circulatory system: Secondary | ICD-10-CM

## 2017-03-25 LAB — BASIC METABOLIC PANEL
Anion gap: 9 (ref 5–15)
BUN: 25 mg/dL — AB (ref 6–20)
CO2: 23 mmol/L (ref 22–32)
CREATININE: 1.07 mg/dL — AB (ref 0.44–1.00)
Calcium: 8.9 mg/dL (ref 8.9–10.3)
Chloride: 100 mmol/L — ABNORMAL LOW (ref 101–111)
GFR calc Af Amer: 54 mL/min — ABNORMAL LOW (ref >60)
GFR, EST NON AFRICAN AMERICAN: 47 mL/min — AB (ref >60)
Glucose, Bld: 91 mg/dL (ref 65–99)
Potassium: 3.2 mmol/L — ABNORMAL LOW (ref 3.5–5.1)
SODIUM: 132 mmol/L — AB (ref 135–145)

## 2017-03-25 MED ORDER — POTASSIUM CHLORIDE CRYS ER 20 MEQ PO TBCR
40.0000 meq | EXTENDED_RELEASE_TABLET | Freq: Once | ORAL | Status: AC
  Administered 2017-03-25: 40 meq via ORAL
  Filled 2017-03-25: qty 2

## 2017-03-25 MED ORDER — ATORVASTATIN CALCIUM 20 MG PO TABS: 20.0000 mg | ORAL_TABLET | Freq: Every day | ORAL | 6 refills | Status: DC

## 2017-03-30 ENCOUNTER — Encounter: Payer: Medicare Other | Admitting: Internal Medicine

## 2017-03-30 DIAGNOSIS — I87333 Chronic venous hypertension (idiopathic) with ulcer and inflammation of bilateral lower extremity: Secondary | ICD-10-CM | POA: Diagnosis not present

## 2017-04-01 NOTE — Progress Notes (Signed)
CHELE, CORNELL ([id number]) Visit Report for 03/30/2017 Chief Complaint Document Details [name], [name]. Date of Service: 03/30/2017 2:00 PM Medical Record Patient Account Number: [id number] Number: Treating RN: Clover Mealy, RN, BSN, [place] Sink 1931/11/17 [phone] y.o. Other Clinician: Date of Birth/Sex: Female) Treating ROBSON, MICHAEL Primary Care Provider: Guy Begin Provider/Extender: G Referring Provider: [name] in Treatment: 7 Information Obtained from: Patient Chief Complaint the patient is here for follow-up evaluation of her bilateral lower extremity ulcers Electronic Signature(s) Signed: 03/31/2017 7:57:33 AM By: Baltazar Najjar MD Entered By: Baltazar Najjar on 03/30/2017 15:21:44 Mikkelson, [name] ([id number]) -------------------------------------------------------------------------------- Debridement Details [name] Date of Service: 03/30/2017 2:00 PM Medical Record Patient Account Number: [id number] Number: Treating RN: Clover Mealy, RN, BSN, [place] Sink 1931-02-13 [phone] y.o. Other Clinician: Date of Birth/Sex: Female) Treating ROBSON, MICHAEL Primary Care Provider: Guy Begin Provider/Extender: G Referring Provider: [name] in Treatment: 7 Debridement Performed for Wound #2 Right,Lateral Malleolus Assessment: Performed By: Physician Maxwell Caul, MD Debridement: Debridement Pre-procedure Yes - 14:57 Verification/Time Out Taken: Start Time: 14:57 Pain Control: Lidocaine 4% Topical Solution Level: Skin/Subcutaneous Tissue/Muscle Total Area Debrided (L x 3.5 (cm) x 2 (cm) = 7 (cm) W): Tissue and other Non-Viable, Fibrin/Slough, Subcutaneous material debrided: Instrument: Curette Bleeding: Minimum Hemostasis Achieved: Pressure End Time: 15:00 Procedural Pain: 0 Post Procedural Pain: 0 Response to Treatment: Procedure was tolerated well Post Debridement Measurements of Total Wound Length:  (cm) 3.5 Width: (cm) 2 Depth: (cm) 0.2 Volume: (cm) 1.1 Character of Wound/Ulcer Post Stable Debridement: Severity of Tissue Post Debridement: Fat layer exposed Post Procedure Diagnosis Same as Pre-procedure Electronic Signature(s) Signed: 03/30/2017 4:51:03 PM By: Elpidio Eric BSN, RN Signed: 03/31/2017 7:57:33 AM By: Baltazar Najjar MD RIELYN, KRUPINSKI ([id number]) Entered By: Baltazar Najjar on 03/30/2017 15:18:32 Seese, [name] ([id number]) -------------------------------------------------------------------------------- Debridement Details [name] Date of Service: 03/30/2017 2:00 PM Medical Record Patient Account Number: [id number] Number: Treating RN: Clover Mealy, RN, BSN, [place] Sink [birth year] [phone] y.o. Other Clinician: Date of Birth/Sex: Female) Treating ROBSON, MICHAEL Primary Care Provider: Guy Begin Provider/Extender: G Referring Provider: [name] in Treatment: 7 Debridement Performed for Wound #3 Left,Medial Lower Leg Assessment: Performed By: Physician Maxwell Caul, MD Debridement: Debridement Pre-procedure Yes - 14:57 Verification/Time Out Taken: Start Time: 14:57 Pain Control: Lidocaine 4% Topical Solution Level: Skin/Subcutaneous Tissue/Muscle Total Area Debrided (L x 2.8 (cm) x 1.4 (cm) = 3.92 (cm) W): Tissue and other Non-Viable, Fibrin/Slough, Subcutaneous material debrided: Instrument: Curette Bleeding: Minimum Hemostasis Achieved: Pressure End Time: 15:00 Procedural Pain: 0 Post Procedural Pain: 0 Response to Treatment: Procedure was tolerated well Post Debridement Measurements of Total Wound Length: (cm) 2.8 Width: (cm) 1.4 Depth: (cm) 0.1 Volume: (cm) 0.308 Character of Wound/Ulcer Post Stable Debridement: Severity of Tissue Post Debridement: Fat layer exposed Post Procedure Diagnosis Same as Pre-procedure Electronic Signature(s) Signed: 03/30/2017 4:51:03 PM By: Elpidio Eric BSN,  RN Signed: 03/31/2017 7:57:33 AM By: Baltazar Najjar MD BRIGHID, KOCH ([id number]) Entered By: Baltazar Najjar on 03/30/2017 15:18:42 Fettes, [name] ([id number]) -------------------------------------------------------------------------------- Debridement Details [name] Date of Service: 03/30/2017 2:00 PM Medical Record Patient Account Number: [id number] Number: Treating RN: Clover Mealy, RN, BSN, [place] Sink [birth year] [phone] y.o. Other Clinician: Date of Birth/Sex: Female) Treating ROBSON, MICHAEL Primary Care Provider: Guy Begin Provider/Extender: G Referring Provider: [name] in Treatment: 7 Debridement Performed for Wound #4 Right,Medial Lower Leg Assessment: Performed By: Physician Maxwell Caul, MD Debridement: Debridement Pre-procedure Yes -  14:57 Verification/Time Out Taken: Start Time: 14:57 Pain Control: Lidocaine 4% Topical Solution Level: Skin/Subcutaneous Tissue/Muscle Total Area Debrided (L x 4 (cm) x 2 (cm) = 8 (cm) W): Tissue and other Non-Viable, Fibrin/Slough, Subcutaneous material debrided: Instrument: Curette Bleeding: Minimum Hemostasis Achieved: Pressure End Time: 15:00 Procedural Pain: 0 Post Procedural Pain: 0 Response to Treatment: Procedure was tolerated well Post Debridement Measurements of Total Wound Length: (cm) 8 Width: (cm) 5 Depth: (cm) 0.1 Volume: (cm) 3.142 Character of Wound/Ulcer Post Stable Debridement: Severity of Tissue Post Debridement: Fat layer exposed Post Procedure Diagnosis Same as Pre-procedure Electronic Signature(s) Signed: 03/30/2017 4:51:03 PM By: Elpidio Eric BSN, RN Signed: 03/31/2017 7:57:33 AM By: Baltazar Najjar MD SABLE, KNOLES ([id number]) Entered By: Baltazar Najjar on 03/30/2017 15:18:51 Childers, [name] ([id number]) -------------------------------------------------------------------------------- HPI Details [name] Date of Service:  03/30/2017 2:00 PM Medical Record Patient Account Number: [id number] Number: Treating RN: Clover Mealy, RN, BSN, [place] Sink 12/08/1931 [phone] y.o. Other Clinician: Date of Birth/Sex: Female) Treating ROBSON, MICHAEL Primary Care Provider: Guy Begin Provider/Extender: G Referring Provider: Guy Begin Weeks in Treatment: 7 History of Present Illness HPI Description: 06/10/16; this is an 83 year old woman who lives near [place] with family members. She saw her primary doctor roughly over a week ago and was referred to the ER for a cellulitis in the right medial leg. She was given a round of IV antibiotics in the emergency room and discharged on oral Keflex which she is taking. She is been left with a uncomfortable leg area on the right medial lower leg. Her ABIs in this clinic were 0.8 on the right 0.6 on the left. She does not describe claudication. She is not a diabetic. Lab work in the ER showed a normal BUN and creatinine on 6/20 at 18 and 0.92 respectively white count was 10.1 hemoglobin at 13.8. She did not have any imaging studies. I can see no vascular evaluations in Epic. She does not have a prior wound history 06/17/16; the area affected on her right medial ankle is an area I thought was due to tissue damage from cellulitis last week. She is going for venous reflux studies this afternoon, these were not ordered in this clinic at least not by me. The patient does not give a prior history of damage to the skin in this area although I wonder if she actually might not of noticed it. We have been using's Aquacel Ag under a wrap although we will not be able to wrap her today 06/24/16: pt reports today she hasn't heard from the vascular clinic regarding proceeding with arterial studies. our office will assist with this today. she denies s/s of infection. denies problems with her wraps. 07/07/16; since I last saw this woman she was admitted to hospital from 7/16 through 7/18 with  sepsis syndrome felt to be secondary to her ulcer. She was treated with Vanco and Zosyn the hospital discharged on Septra. Blood cultures were negative. Urine culture showed multiple species she is now feeling well using Santyl with border foam to her wound. [facility] 07/14/16; no major improvement here. She has a quarter-sized wound covered with thick adherent slough on the lateral aspect of her right leg with several surrounding satellite lesions in a similar state. She has had 2 recent bouts of cellulitis, one before she came to this clinic and one required a recent hospitalization. I have asked for arterial studies I don't believe these have ever been done. My notes suggest  from early in July that she went for reflux studies although I don't know that I have ever seen these either, she did have a DVT rule out but I did not order this, this did not show a DVT but did show a Baker's cyst 07/21/2016 -- the patient had an arterial duplex study done with waveform suggestive of right femoropopliteal disease. There is also small vessel disease bilaterally. Right ABI was in the normal range of 1.0 and a TBI was 0.74. Left ABI is abnormal at 0.69 with a TBI of 0.40. Three-vessel runoff on the right and a two-vessel runoff on the left. She has an appointment to see Dr. Kirke Corin later today. 07/28/2016 -- was seen by Dr. Kirke Corin who reviewed her arterial studies with an ABI of 0.94 on the right and 0.69 on the left with normal right toe brachial index. Duplex showed diffuse atherosclerosis without discrete Atayde, Laneya J. ([id number]) stenosis and there was a three-vessel runoff on the right below the knee. Based on these studies and the location of the ulcer he thought it was venous commended she continue with wound care consults. 08/04/16; I note the patient's reasonably functional arterial supply which is fortunate. The wound bed appears to be smaller. Debrided of surface slough. She is  been using Iodoflex 08/11/16: Only a very small open area remains. However it has some depth 08/18/16; small open area is smaller. Still open however. 08/25/16; the area has totally closed down and epithelialized. READMISSION 02/09/17; this is an 83 year old woman who is not a diabetic or smoker. She was in our clinic in the summer into mid-September with a wound on her right medial calf that. This eventually closed down. I thought this was mostly venous. She did have evidence of PAD and she was seen by Dr. Kirke Corin. At that point she had an ABI of 0.94 on the right 0.69 on the left with a normal right toe brachial index. It was felt that she had enough blood flow to close her wound and that happened. She was discharged with stockings although I don't get the sense that she really use them she tells me that roughly a week or 2 she developed swelling in her bilateral lower legs and then noted wounds on her right lateral malleolus and left medial calf. The on this she is not really sure how this happened. She is not having a lot of pain although she is not ambulatory that much only in her home. Other than weight loss she has not noticed any other issues no specific claudication no chest pain. 02/16/17; the patient continues with 2 wounds on the right lateral malleolus and left medial calf. Both of these are painful. She has chronic venous insufficiency but also known PAD and is previously seen Dr. Kirke Corin. We have arranged vascular follow-up with Dr. Kirke Corin to look at the arterial situation 02/25/2017 -- patient was brought in today because she missed her appointment last week with Dr. Leanord Hawking and is having a lot of pain and drainage from her wounds. The interim she was seen by Dr. Bascom Levels who did a duplex of the right lower extremity which shows a right ABI of 0.95 and left ABI 0.74. There was also a abdominal aortogram with a bilateral extremity angiography done for significant peripheral vascular  disease. The right lower extremity showed diffuse SFA and popliteal disease with significant stenosis affecting the TP trunk just before the origin of the peritoneal artery. The left lower extremity showed moderate left SFA stenosis  with occluded popliteal artery and tibioperoneal vessels with only visible collateral. The opinion was the patient had no endovascular surgical revascularization options of the left lower extremity and Dr. Bascom Levels would discuss it with Dr. Randie Heinz for a second opinion. Endovascular intervention on the right TP trunk could be considered. 03/03/17; the patient arrives back today having last seen Dr. Meyer Russel. As noted she has severe PAD which is not amenable to either bypass surgery or endovascular interventions on the left. Endovascular interventions in the right TP trunk could be considered. She has large wounds on the right medial leg and a sizable wound on the right lateral leg. Small wound on the left medial leg. She is really in a lot of discomfort. Using Santyl on the right and I believe Prisma on the left. 03/10/17; the patient arrives today again with large wounds on the right lateral lower extremity, right medial lower extremity. And a small punched out wound on the left medial lower extremity. These are in the setting of severe non-revascularizable PAD. She continues to complain of discomfort although I don't think right now this is unmanageable. We have been using Santyl. We are arranging getting [facility]] 03/16/17- patient is here for follow-up evaluation of her bilateral lower extremity ulcers. She has an appointment on 4/11 for an arthrectomy per Dr. Kirke Corin at [facility]. She is continuing to [place]. Her son is doing her dressing changes 03/30/17 still using santyl. sincer her last visit she was admitted from 4/11-4/12 for arthrectomy and balloon TYLIE, GOLONKA. ([id number]) angioplasty of the right distal popliteal artery with TP trunk  and peritoneal arteries. she is on dual antiplatelet and states her pain is somewhat better on the right Electronic Signature(s) Signed: 03/31/2017 7:57:33 AM By: Baltazar Najjar MD Entered By: Baltazar Najjar on 03/30/2017 15:24:07 Laventure, [name] ([id number]) -------------------------------------------------------------------------------- Physical Exam Details Patient Name: KATHERINE, TOUT. Date of Service: 03/30/2017 2:00 PM Medical Record Patient Account Number: [id number] Number: Treating RN: Clover Mealy, RN, BSN, [place] Sink [birth year] [phone] y.o. Other Clinician: Date of Birth/Sex: Female) Treating ROBSON, MICHAEL Primary Care Provider: Guy Begin Provider/Extender: G Referring Provider: Guy Begin Weeks in Treatment: 7 Constitutional Sitting or standing Blood Pressure is within target range for patient.. Pulse regular and within target range for patient.Marland Kitchen Respirations regular, non-labored and within target range.. Temperature is normal and within the target range for the patient.. Patient's appearance is neat and clean. Appears in no acute distress. Well nourished and well developed.. Cardiovascular Pedal pulses absent bilaterally.. Notes wound exam areas on the lateral right leg,medial right leg and medial left leg are debrided of necrotic surface material,necrotic subcutaneous tissue and muscle. Hemostasis with direct pressure which she tolerates fairly well. Surface appears to be better. Electronic Signature(s) Signed: 03/31/2017 7:57:33 AM By: Baltazar Najjar MD Entered By: Baltazar Najjar on 03/30/2017 15:29:39 Cuneo, [name] ([id number]) -------------------------------------------------------------------------------- Physician Orders Details Patient Name: MALIK, RUFFINO. Date of Service: 03/30/2017 2:00 PM Medical Record Patient Account Number: [id number] Number: Treating RN: Clover Mealy, RN, BSN, [place] Sink 01-07-1931 [phone] y.o. Other Clinician: Date of  Birth/Sex: Female) Treating ROBSON, MICHAEL Primary Care Provider: Guy Begin Provider/Extender: G Referring Provider: [name] in Treatment: 7 Verbal / Phone Orders: No Diagnosis Coding Wound Cleansing Wound #2 Right,Lateral Malleolus o Cleanse wound with mild soap and water Wound #3 Left,Medial Lower Leg o Cleanse wound with mild soap and water Wound #4 Right,Medial Lower Leg o Cleanse wound with mild soap and water Anesthetic Wound #2 Right,Lateral Malleolus   o Topical Lidocaine 4% cream applied to wound bed prior to debridement - clinic use only Wound #3 Left,Medial Lower Leg o Topical Lidocaine 4% cream applied to wound bed prior to debridement - clinic use only Wound #4 Right,Medial Lower Leg o Topical Lidocaine 4% cream applied to wound bed prior to debridement - clinic use only Primary Wound Dressing Wound #2 Right,Lateral Malleolus o Santyl Ointment Wound #3 Left,Medial Lower Leg o Santyl Ointment Wound #4 Right,Medial Lower Leg o Santyl Ointment Secondary Dressing Wound #2 Right,Lateral Malleolus o Conform/Kerlix o Non-adherent pad o Other - Ace Wrap lightly to BENNY, HENRIE. ([id number]) Wound #3 Left,Medial Lower Leg o Conform/Kerlix o Non-adherent pad o Other - Ace Wrap lightly to Wound #4 Right,Medial Lower Leg o Conform/Kerlix o Non-adherent pad o Other - Ace Wrap lightly to Dressing Change Frequency Wound #2 Right,Lateral Malleolus o Change dressing every day. - HHRN twice weekly. May teach available family member wound care for additional days. Wound #3 Left,Medial Lower Leg o Change dressing every day. - HHRN twice weekly. May teach available family member wound care for additional days. Wound #4 Right,Medial Lower Leg o Change dressing every day. - HHRN twice weekly. May teach available family member wound care for additional days. Follow-up Appointments Wound #2 Right,Lateral  Malleolus o Return Appointment in 1 week. Wound #3 Left,Medial Lower Leg o Return Appointment in 1 week. Wound #4 Right,Medial Lower Leg o Return Appointment in 1 week. Edema Control Wound #2 Right,Lateral Malleolus o Elevate legs to the level of the heart and pump ankles as often as possible Wound #3 Left,Medial Lower Leg o Elevate legs to the level of the heart and pump ankles as often as possible Wound #4 Right,Medial Lower Leg o Elevate legs to the level of the heart and pump ankles as often as possible Additional Orders / Instructions Wound #2 Right,Lateral Malleolus o Increase protein intake. SUHAYLAH, WAMPOLE ([id number]) Wound #3 Left,Medial Lower Leg o Increase protein intake. Wound #4 Right,Medial Lower Leg o Increase protein intake. Home Health Wound #2 Right,Lateral Malleolus o Initiate Home Health [facility]illed [facility] - [facility] o Continue Home Health Visits - Twice weekly. Patient comes to wound care center on Tuesdays. o Home Health Nurse may visit PRN to address patientos wound care needs. o FACE TO FACE ENCOUNTER: MEDICARE and MEDICAID PATIENTS: I certify that this patient is under my care and that I had a face-to-face encounter that meets the physician face-to-face encounter requirements with this patient on this date. The encounter with the patient was in whole or in part for the following MEDICAL CONDITION: (primary reason for Home Healthcare) MEDICAL NECESSITY: I certify, that based on my findings, NURSING services are a medically necessary home health service. HOME BOUND STATUS: I certify that my clinical findings support that this patient is homebound (i.e., Due to illness or injury, pt requires aid of supportive devices such as crutches, cane, wheelchairs, walkers, the use of special transportation or the assistance of another person to leave their place of residence. There is a normal inability to leave the home and doing  so requires considerable and taxing effort. Other absences are for medical reasons / religious services and are infrequent or of short duration when for other reasons). o If current dressing causes regression in wound condition, may D/C ordered dressing product/s and apply Normal Saline Moist Dressing daily until next Wound Healing Center / Other MD appointment. Notify Wound Healing Center of regression in wound condition at [phone]. o  Please direct any NON-WOUND related issues/requests for orders to patient's Primary Care Physician Wound #3 Left,Medial Lower Leg o Initiate Home Health for [facility] - [facility] o Continue Home Health Visits - Twice weekly. Patient comes to wound care center on Tuesdays. o Home Health Nurse may visit PRN to address patientos wound care needs. o FACE TO FACE ENCOUNTER: MEDICARE and MEDICAID PATIENTS: I certify that this patient is under my care and that I had a face-to-face encounter that meets the physician face-to-face encounter requirements with this patient on this date. The encounter with the patient was in whole or in part for the following MEDICAL CONDITION: (primary reason for Home Healthcare) MEDICAL NECESSITY: I certify, that based on my findings, NURSING services are a medically necessary home health service. HOME BOUND STATUS: I certify that my clinical findings support that this patient is homebound (i.e., Due to illness or injury, pt requires aid of supportive devices such as crutches, cane, wheelchairs, walkers, the use of special transportation or the assistance of another person to leave their place of residence. There is a normal inability to leave the home and doing so requires considerable and taxing effort. Other absences are for medical reasons / religious services and are infrequent or of short duration when for other reasons). o If current dressing causes regression in wound condition, may D/C ordered  dressing product/s and apply Normal Saline Moist Dressing daily until next Wound Healing Center / Other MD appointment. Notify Wound Healing Center of regression in wound condition at [phone]. TOMEKA, KANTNER ([id number]) o Please direct any NON-WOUND related issues/requests for orders to patient's Primary Care Physician Wound #4 Right,Medial Lower Leg o Initiate Home [facility] - [facility] o Continue Home Health Visits - Twice weekly. Patient comes to wound care center on Tuesdays. o Home Health Nurse may visit PRN to address patientos wound care needs. o FACE TO FACE ENCOUNTER: MEDICARE and MEDICAID PATIENTS: I certify that this patient is under my care and that I had a face-to-face encounter that meets the physician face-to-face encounter requirements with this patient on this date. The encounter with the patient was in whole or in part for the following MEDICAL CONDITION: (primary reason for Home Healthcare) MEDICAL NECESSITY: I certify, that based on my findings, NURSING services are a medically necessary home health service. HOME BOUND STATUS: I certify that my clinical findings support that this patient is homebound (i.e., Due to illness or injury, pt requires aid of supportive devices such as crutches, cane, wheelchairs, walkers, the use of special transportation or the assistance of another person to leave their place of residence. There is a normal inability to leave the home and doing so requires considerable and taxing effort. Other absences are for medical reasons / religious services and are infrequent or of short duration when for other reasons). o If current dressing causes regression in wound condition, may D/C ordered dressing product/s and apply Normal Saline Moist Dressing daily until next Wound Healing Center / Other MD appointment. Notify Wound Healing Center of regression in wound condition at [phone]. o Please direct  any NON-WOUND related issues/requests for orders to patient's Primary Care Physician Electronic Signature(s) Signed: 03/30/2017 4:51:03 PM By: Elpidio Eric BSN, RN Signed: 03/31/2017 7:57:33 AM By: Baltazar Najjar MD Entered By: Elpidio Eric on 03/30/2017 15:01:08 EMMERIE, BATTAGLIA ([id number]) -------------------------------------------------------------------------------- Problem List Details Patient Name: DANYAL, ADORNO. Date of Service: 03/30/2017 2:00 PM Medical Record Patient Account Number: [id number] Number:  Treating RN: Clover Mealy, RN, BSN, [place] [birth year] [phone] y.o. 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/09/2017 Yes muscle L97.221 Non-pressure chronic ulcer of left calf limited to 02/09/2017 Yes breakdown of skin I70.233 Atherosclerosis of native arteries of right leg with 02/25/2017 Yes ulceration of ankle I70.243 Atherosclerosis of native arteries of left leg with ulceration 02/25/2017 Yes of ankle Inactive Problems Resolved Problems Electronic Signature(s) Signed: 03/31/2017 7:57:33 AM By: Baltazar Najjar MD Entered By: Baltazar Najjar on 03/30/2017 15:16:29 Moquin, [name] ([id number]) -------------------------------------------------------------------------------- Progress Note Details [name] Date of Service: 03/30/2017 2:00 PM Medical Record Patient Account Number: [id number] Number: Treating RN: Clover Mealy, RN, BSN,  Sink 1931-08-06 [id number] y.o. Other Clinician: Date of Birth/Sex: Female) Treating ROBSON, MICHAEL Primary Care Provider:  Guy Begin Provider/Extender: G Referring Provider: Guy Begin Weeks in Treatment: 7 Subjective Chief Complaint Information obtained from Patient the patient is here for follow-up evaluation of her bilateral lower extremity ulcers History of Present Illness (HPI) 06/10/16; this is an 83 year old woman who lives near [place] with family members. She saw her primary doctor roughly over a week ago and was referred to the ER for a cellulitis in the right medial leg. She was given a round of IV antibiotics in the emergency room and discharged on oral Keflex which she is taking. She is been left with a uncomfortable leg area on the right medial lower leg. Her ABIs in this clinic were 0.8 on the right 0.6 on the left. She does not describe claudication. She is not a diabetic. Lab work in the ER showed a normal BUN and creatinine on 6/20 at 18 and 0.92 respectively white count was 10.1 hemoglobin at 13.8. She did not have any imaging studies. I can see no vascular evaluations in Epic. She does not have a prior wound history 06/17/16; the area affected on her right medial ankle is an area I thought was due to tissue damage from cellulitis last week. She is going for venous reflux studies this afternoon, these were not ordered in this clinic at least not by me. The patient does not give a prior history of damage to the skin in this area although I wonder if she actually might not of noticed it. We have been using's Aquacel Ag under a wrap although we will not be able to wrap her today 06/24/16: pt reports today she hasn't heard from the vascular clinic regarding proceeding with arterial studies. our office will assist with this today. she denies s/s of infection. denies problems with her wraps. 07/07/16; since I last saw this woman she was admitted to hospital from 7/16 through 7/18 with sepsis syndrome felt to be secondary to her ulcer. She was treated with Vanco and Zosyn the hospital  discharged on Septra. Blood cultures were negative. Urine culture showed multiple species she is now feeling well using Santyl with border foam to her wound. [facility] 07/14/16; no major improvement here. She has a quarter-sized wound covered with thick adherent slough on the lateral aspect of her right leg with several surrounding satellite lesions in a similar state. She has had 2 recent bouts of cellulitis, one before she came to this clinic  and one required a recent hospitalization. I have asked for arterial studies I don't believe these have ever been done. My notes suggest from early in July that she went for reflux studies although I don't know that I have ever seen these either, she did have a DVT rule out but I did not order this, this did not show a DVT but did show a Baker's cyst 07/21/2016 -- the patient had an arterial duplex study done with waveform suggestive of right femoropopliteal disease. There is also small vessel disease bilaterally. Right ABI was in the normal range of 1.0 and a TBI Faires, Parthenia J. ([id number]) was 0.74. Left ABI is abnormal at 0.69 with a TBI of 0.40. Three-vessel runoff on the right and a two-vessel runoff on the left. She has an appointment to see Dr. Kirke Corin later today. 07/28/2016 -- was seen by Dr. Kirke Corin who reviewed her arterial studies with an ABI of 0.94 on the right and 0.69 on the left with normal right toe brachial index. Duplex showed diffuse atherosclerosis without discrete stenosis and there was a three-vessel runoff on the right below the knee. Based on these studies and the location of the ulcer he thought it was venous commended she continue with wound care consults. 08/04/16; I note the patient's reasonably functional arterial supply which is fortunate. The wound bed appears to be smaller. Debrided of surface slough. She is been using Iodoflex 08/11/16: Only a very small open area remains. However it has some depth 08/18/16;  small open area is smaller. Still open however. 08/25/16; the area has totally closed down and epithelialized. READMISSION 02/09/17; this is an 83 year old woman who is not a diabetic or smoker. She was in our clinic in the summer into mid-September with a wound on her right medial calf that. This eventually closed down. I thought this was mostly venous. She did have evidence of PAD and she was seen by Dr. Kirke Corin. At that point she had an ABI of 0.94 on the right 0.69 on the left with a normal right toe brachial index. It was felt that she had enough blood flow to close her wound and that happened. She was discharged with stockings although I don't get the sense that she really use them she tells me that roughly a week or 2 she developed swelling in her bilateral lower legs and then noted wounds on her right lateral malleolus and left medial calf. The on this she is not really sure how this happened. She is not having a lot of pain although she is not ambulatory that much only in her home. Other than weight loss she has not noticed any other issues no specific claudication no chest pain. 02/16/17; the patient continues with 2 wounds on the right lateral malleolus and left medial calf. Both of these are painful. She has chronic venous insufficiency but also known PAD and is previously seen Dr. Kirke Corin. We have arranged vascular follow-up with Dr. Kirke Corin to look at the arterial situation 02/25/2017 -- patient was brought in today because she missed her appointment last week with Dr. Leanord Hawking and is having a lot of pain and drainage from her wounds. The interim she was seen by Dr. Bascom Levels who did a duplex of the right lower extremity which shows a right ABI of 0.95 and left ABI 0.74. There was also a abdominal aortogram with a bilateral extremity angiography done for significant peripheral vascular disease. The right lower extremity showed diffuse SFA and popliteal disease with significant  stenosis affecting  the TP trunk just before the origin of the peritoneal artery. The left lower extremity showed moderate left SFA stenosis with occluded popliteal artery and tibioperoneal vessels with only visible collateral. The opinion was the patient had no endovascular surgical revascularization options of the left lower extremity and Dr. Bascom Levels would discuss it with Dr. Randie Heinz for a second opinion. Endovascular intervention on the right TP trunk could be considered. 03/03/17; the patient arrives back today having last seen Dr. Meyer Russel. As noted she has severe PAD which is not amenable to either bypass surgery or endovascular interventions on the left. Endovascular interventions in the right TP trunk could be considered. She has large wounds on the right medial leg and a sizable wound on the right lateral leg. Small wound on the left medial leg. She is really in a lot of discomfort. Using Santyl on the right and I believe Prisma on the left. 03/10/17; the patient arrives today again with large wounds on the right lateral lower extremity, right medial lower extremity. And a small punched out wound on the left medial lower extremity. These are in the setting of severe non-revascularizable PAD. She continues to complain of discomfort although I don't think right now this is unmanageable. We have been using Santyl. We are arranging getting her home health [facility] ALEKSANDRA, RABEN ([id number]) 03/16/17- patient is here for follow-up evaluation of her bilateral lower extremity ulcers. She has an appointment on 4/11 for an arthrectomy per Dr. Kirke Corin at [facility]. She is continuing to [place]. Her son is doing her dressing changes 03/30/17 still using santyl. sincer her last visit she was admitted from 4/11-4/12 for arthrectomy and balloon angioplasty of the right distal popliteal artery with TP trunk and peritoneal arteries. she is on dual antiplatelet and states her pain is somewhat better on the  right Objective Constitutional Sitting or standing Blood Pressure is within target range for patient.. Pulse regular and within target range for patient.Marland Kitchen Respirations regular, non-labored and within target range.. Temperature is normal and within the target range for the patient.. Patient's appearance is neat and clean. Appears in no acute distress. Well nourished and well developed.. Vitals Time Taken: 2:38 PM, Height: 66 in, Weight: 159 lbs, BMI: 25.7, Temperature: 97.7 F, Pulse: 77 bpm, Respiratory Rate: 17 breaths/min, Blood Pressure: 124/65 mmHg. Cardiovascular Pedal pulses absent bilaterally.. General Notes: wound exam areas on the lateral right leg,medial right leg and medial left leg are debrided of necrotic surface material,necrotic subcutaneous tissue and muscle. Hemostasis with direct pressure which she tolerates fairly well. Surface appears to be better. Integumentary (Hair, Skin) Wound #2 status is Open. Original cause of wound was Gradually Appeared. The wound is located on the Right,Lateral Malleolus. The wound measures 3.5cm length x 2cm width x 0.2cm depth; 5.498cm^2 area and 1.1cm^3 volume. There is Fat Layer (Subcutaneous Tissue) Exposed exposed. There is no tunneling or undermining noted. There is a medium amount of serosanguineous drainage noted. The wound margin is distinct with the outline attached to the wound base. There is small (1-33%) pink, pale granulation within the wound bed. There is a large (67-100%) amount of necrotic tissue within the wound bed including Adherent Slough. The periwound skin appearance exhibited: Hemosiderin Staining, Mottled. The periwound skin appearance did not exhibit: Callus, Crepitus, Excoriation, Induration, Rash, Scarring, Dry/Scaly, Maceration, Atrophie Blanche, Cyanosis, Ecchymosis, Pallor, Rubor, Erythema. Periwound temperature was noted as No Abnormality. The periwound has tenderness on palpation. Wound #3 status is Open.  Original cause of  wound was Gradually Appeared. The wound is located on the Left,Medial Lower Leg. The wound measures 2.8cm length x 1.4cm width x 0.1cm depth; 3.079cm^2 area and 0.308cm^3 volume. There is Fat Layer (Subcutaneous Tissue) Exposed exposed. There is no tunneling or undermining noted. There is a medium amount of serosanguineous drainage noted. The wound margin is distinct with the outline attached to the wound base. There is small (1-33%) pink, pale granulation within the Sipe, Ally J. ([id number]) wound bed. There is a large (67-100%) amount of necrotic tissue within the wound bed including Adherent Slough. The periwound skin appearance exhibited: Hemosiderin Staining, Mottled. The periwound skin appearance did not exhibit: Callus, Crepitus, Excoriation, Induration, Rash, Scarring, Dry/Scaly, Maceration, Atrophie Blanche, Cyanosis, Ecchymosis, Pallor, Rubor, Erythema. Periwound temperature was noted as No Abnormality. The periwound has tenderness on palpation. Wound #4 status is Open. Original cause of wound was Gradually Appeared. The wound is located on the Right,Medial Lower Leg. The wound measures 8cm length x 5cm width x 0.1cm depth; 31.416cm^2 area and 3.142cm^3 volume. There is Fat Layer (Subcutaneous Tissue) Exposed exposed. There is no tunneling or undermining noted. There is a large amount of serosanguineous drainage noted. The wound margin is distinct with the outline attached to the wound base. There is small (1-33%) pink, pale granulation within the wound bed. There is a large (67-100%) amount of necrotic tissue within the wound bed including Adherent Slough. The periwound skin appearance exhibited: Hemosiderin Staining, Mottled. The periwound skin appearance did not exhibit: Callus, Crepitus, Excoriation, Induration, Rash, Scarring, Dry/Scaly, Maceration, Atrophie Blanche, Cyanosis, Ecchymosis, Pallor, Rubor, Erythema. Periwound temperature was noted as No  Abnormality. The periwound has tenderness on palpation. 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 There was a Skin/Subcutaneous Tissue/Muscle Debridement ([id number]) debridement with total area of 7 sq cm performed by Maxwell Caul, MD. with the following instrument(s): Curette to remove Non-Viable tissue/material including Fibrin/Slough and Subcutaneous after achieving pain control using Lidocaine 4% Topical Solution. A time out was conducted at 14:57, prior to the start of the procedure. A Minimum amount of bleeding was controlled with Pressure. The procedure was tolerated well with a pain level of 0 throughout and a pain level of 0 following the procedure. Post Debridement Measurements: 3.5cm length x 2cm width x 0.2cm depth; 1.1cm^3 volume. Character of Wound/Ulcer Post Debridement is stable. Severity of Tissue Post Debridement is: Fat layer exposed. Post procedure Diagnosis Wound #2: Same as Pre-Procedure Galea, Marayah J. ([id number]) Wound #3 Wound #3 is a Venous Leg Ulcer located on the Left,Medial Lower Leg . There was a Skin/Subcutaneous Tissue/Muscle Debridement ([id number]) debridement with total area of 3.92 sq cm performed by Maxwell Caul, MD. with the following instrument(s): Curette to remove Non-Viable tissue/material including Fibrin/Slough and Subcutaneous after achieving pain control using Lidocaine 4% Topical Solution. A time out was conducted at 14:57,  prior to the start of the procedure. A Minimum amount of bleeding was controlled with Pressure. The procedure was tolerated well with a pain level of 0 throughout and a pain level of 0 following  the procedure. Post Debridement Measurements: 2.8cm length x 1.4cm width x 0.1cm depth; 0.308cm^3 volume. Character of Wound/Ulcer Post Debridement is stable. Severity of Tissue Post Debridement is: Fat layer exposed. Post procedure Diagnosis Wound #3: Same as Pre-Procedure Wound #4 Wound #4 is a Venous Leg Ulcer located on the Right,Medial Lower Leg . There was a Skin/Subcutaneous Tissue/Muscle Debridement ([id number]) debridement with total area of 8 sq cm performed by Maxwell Caul, MD. with the following instrument(s): Curette to remove Non-Viable tissue/material including Fibrin/Slough and Subcutaneous after achieving pain control using Lidocaine 4% Topical Solution. A time out was conducted at 14:57, prior to the start of the procedure. A Minimum amount of bleeding was controlled with Pressure. The procedure was tolerated well with a pain level of 0 throughout and a pain level of 0 following the procedure. Post Debridement Measurements: 8cm length x 5cm width x 0.1cm depth; 3.142cm^3 volume. Character of Wound/Ulcer Post Debridement is stable. Severity of Tissue Post Debridement is: Fat layer exposed. Post procedure Diagnosis Wound #4: Same as Pre-Procedure Plan Wound Cleansing: Wound #2 Right,Lateral Malleolus: Cleanse wound with mild soap and water Wound #3 Left,Medial Lower Leg: Cleanse wound with mild soap and water Wound #4 Right,Medial Lower Leg: Cleanse wound with mild soap and water Anesthetic: Wound #2 Right,Lateral Malleolus: Topical Lidocaine 4% cream applied to wound bed prior to debridement - clinic use only Wound #3 Left,Medial Lower Leg: Topical Lidocaine 4% cream applied to wound bed prior to debridement - clinic use only Wound #4 Right,Medial Lower Leg: Topical  Lidocaine 4% cream applied to wound bed prior to debridement - clinic use only Benassi, Jann J. ([id number]) Primary Wound Dressing: Wound #2 Right,Lateral Malleolus: Santyl Ointment Wound #3 Left,Medial Lower Leg: Santyl Ointment Wound #4 Right,Medial Lower Leg: Santyl Ointment Secondary Dressing: Wound #2 Right,Lateral Malleolus: Conform/Kerlix Non-adherent pad Other - Ace Wrap lightly to Wound #3 Left,Medial Lower Leg: Conform/Kerlix Non-adherent pad Other - Ace Wrap lightly to Wound #4 Right,Medial Lower Leg: Conform/Kerlix Non-adherent pad Other - Ace Wrap lightly to Dressing Change Frequency: Wound #2 Right,Lateral Malleolus: Change dressing every day. - HHRN twice weekly. May teach available family member wound care for additional days. Wound #3 Left,Medial Lower Leg: Change dressing every day. - HHRN twice weekly. May teach available family member wound care for additional days. Wound #4 Right,Medial Lower Leg: Change dressing every day. - HHRN twice weekly. May teach available family member wound care for additional days. Follow-up Appointments: Wound #2 Right,Lateral Malleolus: Return Appointment in 1 week. Wound #3 Left,Medial Lower Leg: Return Appointment in 1 week. Wound #4 Right,Medial Lower Leg: Return Appointment in 1 week. Edema Control: Wound #2 Right,Lateral Malleolus: Elevate legs to the level of the heart and pump ankles as often as possible Wound #3 Left,Medial Lower Leg: Elevate legs to the level of the heart and pump ankles as often as possible Wound #4 Right,Medial Lower Leg: Elevate legs to the level of the heart and pump ankles as often as possible Additional Orders / Instructions: Wound #2 Right,Lateral Malleolus: Increase protein intake. Wound #3 Left,Medial Lower Leg: Increase protein intake. Wound #4 Right,Medial Lower Leg: Increase protein intake. LOURDEZ, MCGAHAN ([id number]) Home Health: Wound #2 Right,Lateral Malleolus: Initiate  [facility] Skilled [facility] - [facility] Continue Home Health Visits - Twice weekly. Patient comes to wound care center on Tuesdays. Home Health Nurse may visit PRN to address patient s wound care needs. FACE TO FACE ENCOUNTER: MEDICARE and MEDICAID PATIENTS: I certify that this patient  is under my care and that I had a face-to-face encounter that meets the physician face-to-face encounter requirements with this patient on this date. The encounter with the patient was in whole or in part for the following MEDICAL CONDITION: (primary reason for Home Healthcare) MEDICAL NECESSITY: I certify, that based on my findings, NURSING services are a medically necessary home health service. HOME BOUND STATUS: I certify that my clinical findings support that this patient is homebound (i.e., Due to illness or injury, pt requires aid of supportive devices such as crutches, cane, wheelchairs, walkers, the use of special transportation or the assistance of another person to leave their place of residence. There is a normal inability to leave the home and doing so requires considerable and taxing effort. Other absences are for medical reasons / religious services and are infrequent or of short duration when for other reasons). If current dressing causes regression in wound condition, may D/C ordered dressing product/s and apply Normal Saline Moist Dressing daily until next Wound Healing Center / Other MD appointment. Notify Wound Healing Center of regression in wound condition at [phone]. Please direct any NON-WOUND related issues/requests for orders to patient's Primary Care Physician Wound #3 Left,Medial Lower Leg: Initiate Home [facility] for Skilled [facility] - [facility] Continue Home Health Visits - Twice weekly. Patient comes to wound care center on Tuesdays. Home Health Nurse may visit PRN to address patient s wound care needs. FACE TO FACE ENCOUNTER: MEDICARE and MEDICAID PATIENTS: I  certify that this patient is under my care and that I had a face-to-face encounter that meets the physician face-to-face encounter requirements with this patient on this date. The encounter with the patient was in whole or in part for the following MEDICAL CONDITION: (primary reason for Home Healthcare) MEDICAL NECESSITY: I certify, that based on my findings, NURSING services are a medically necessary home health service. HOME BOUND STATUS: I certify that my clinical findings support that this patient is homebound (i.e., Due to illness or injury, pt requires aid of supportive devices such as crutches, cane, wheelchairs, walkers, the use of special transportation or the assistance of another person to leave their place of residence. There is a normal inability to leave the home and doing so requires considerable and taxing effort. Other absences are for medical reasons / religious services and are infrequent or of short duration when for other reasons). If current dressing causes regression in wound condition, may D/C ordered dressing product/s and apply Normal Saline Moist Dressing daily until next Wound Healing Center / Other MD appointment. Notify Wound Healing Center of regression in wound condition at [phone]. Please direct any NON-WOUND related issues/requests for orders to patient's Primary Care Physician Wound #4 Right,Medial Lower Leg: Initiate Home [facility] for Skilled [facility] - [facility] Continue Home Health Visits - Twice weekly. Patient comes to wound care center on Tuesdays. Home Health Nurse may visit PRN to address patient s wound care needs. FACE TO FACE ENCOUNTER: MEDICARE and MEDICAID PATIENTS: I certify that this patient is under my care and that I had a face-to-face encounter that meets the physician face-to-face encounter requirements with this patient on this date. The encounter with the patient was in whole or in part for the following MEDICAL CONDITION:  (primary reason for Home Healthcare) MEDICAL NECESSITY: I certify, that based on my findings, NURSING services are a medically necessary home health service. HOME BOUND STATUS: I certify that my clinical findings support that this patient is homebound (i.e., Due  to illness or injury, pt requires aid of supportive devices such as crutches, cane, wheelchairs, walkers, the use of special transportation or the assistance of another person to leave their place of residence. There is a normal inability to leave the home and doing so requires considerable and taxing effort. Other absences are for medical reasons / religious services and are infrequent or of short duration when for other reasons). KIMBERLA, DRISKILL ([id number]) If current dressing causes regression in wound condition, may D/C ordered dressing product/s and apply Normal Saline Moist Dressing daily until next Wound Healing Center / Other MD appointment. Notify Wound Healing Center of regression in wound condition at [phone]. Please direct any NON-WOUND related issues/requests for orders to patient's Primary Care Physician 1 continue santyl 2 surface ofthe wounds post debridement looks alot better 3 looks to possible change of primary dressing next week Electronic Signature(s) Signed: 03/31/2017 7:57:33 AM By: Baltazar Najjar MD Entered By: Baltazar Najjar on 03/30/2017 15:31:03 Mccanless, [name] ([id number]) -------------------------------------------------------------------------------- SuperBill Details [name] Date of Service: 03/30/2017 Medical Record Patient Account Number: [id number] Number: Treating RN: Clover Mealy, RN, BSN, [place] [birth year] [phone] y.o. 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 I70.243 Atherosclerosis of native arteries of left leg with ulceration of ankle Facility Procedures CPT4 Code Description: [id number] 11043 - DEB MUSC/FASCIA 20 SQ CM/< ICD-10 Description Diagnosis L97.313 Non-pressure chronic ulcer of right ankle with necros Modifier: is of muscle Quantity: 1 Physician Procedures CPT4 Code Description: [id number] 11043 - WC PHYS DEBR MUSCLE/FASCIA 20 SQ CM ICD-10 Description Diagnosis L97.313 Non-pressure chronic ulcer of right ankle with necrosis o Modifier: f muscle Quantity: 1 Electronic Signature(s) Signed: 03/31/2017 7:57:33 AM By: Baltazar Najjar MD Entered By: Baltazar Najjar on 03/30/2017 15:31:18

## 2017-04-01 NOTE — Progress Notes (Signed)
[name] ([id number]) Visit Report for 03/30/2017 Arrival Information Details [name], [name]. Date of Service: 03/30/2017 2:00 PM Medical Record Patient Account Number: [id number] Number: Treating RN: Clover Mealy, RN, BSN, [place] Sink September 16, 1931 [phone] y.o. Other Clinician: Date of Birth/Sex: Female) Treating ROBSON, MICHAEL Primary Care Early Steel: Guy Begin Jshon Ibe/Extender: G Referring Tieler Cournoyer: [name] in Treatment: 7 Visit Information History Since Last Visit All ordered tests and consults were completed: No Patient Arrived: Wheel Chair Added or deleted any medications: No Arrival Time: 14:33 Any new allergies or adverse reactions: No Accompanied By: son Had a fall or experienced change in No Transfer Assistance: None activities of daily living that may affect Patient Identification Verified: Yes risk of falls: Secondary Verification Process Yes Signs or symptoms of abuse/neglect since last No Completed: visito Patient Requires Transmission- No Hospitalized since last visit: No Based Precautions: Has Dressing in Place as Prescribed: Yes Patient Has Alerts: Yes Has Compression in Place as Prescribed: Yes Patient Alerts: Patient on Blood Pain Present Now: Yes Thinner Plavix Electronic Signature(s) Signed: 03/30/2017 4:51:03 PM By: Elpidio Eric BSN, RN Entered By: Elpidio Eric on 03/30/2017 14:35:06 Bourquin, [name] ([id number]) -------------------------------------------------------------------------------- Encounter Discharge Information Details [name] Date of Service: 03/30/2017 2:00 PM Medical Record Patient Account Number: [id number] Number: Treating RN: Clover Mealy, RN, BSN, [place] Sink [birth year] [phone] y.o. Other Clinician: Date of Birth/Sex: Female) Treating ROBSON, MICHAEL Primary Care Townes Fuhs: Guy Begin Rashia Mckesson/Extender: G Referring Breezy Hertenstein: [name] in Treatment: 7 Encounter  Discharge Information Items Discharge Pain Level: 0 Discharge Condition: Stable Ambulatory Status: Wheelchair Discharge Destination: Home Transportation: Private Auto Accompanied By: son Schedule Follow-up Appointment: No Medication Reconciliation completed and provided to Patient/Care No Mccartney Chuba: Provided on Clinical Summary of Care: 03/30/2017 Form Type Recipient Paper Patient CF Electronic Signature(s) Signed: 03/30/2017 4:51:03 PM By: Elpidio Eric BSN, RN Previous Signature: 03/30/2017 3:12:44 PM Version By: Gwenlyn Perking Entered By: Elpidio Eric on 03/30/2017 15:14:15 Burgett, [name] ([id number]) -------------------------------------------------------------------------------- Lower Extremity Assessment Details [name] Date of Service: 03/30/2017 2:00 PM Medical Record Patient Account Number: [id number] Number: Treating RN: Clover Mealy, RN, BSN, [place] Sink 22-Mar-1931 [phone] y.o. Other Clinician: Date of Birth/Sex: Female) Treating ROBSON, MICHAEL Primary Care Niv Darley: Guy Begin Tavita Eastham/Extender: G Referring Kristle Wesch: [name] in Treatment: 7 Vascular Assessment Claudication: Claudication Assessment [Left:None] [Right:None] Pulses: Dorsalis Pedis Palpable: [Left:Yes] [Right:Yes] Posterior Tibial Extremity colors, hair growth, and conditions: Extremity Color: [Left:Hyperpigmented] [Right:Hyperpigmented] Hair Growth on Extremity: [Left:No] [Right:No] Temperature of Extremity: [Left:Warm] [Right:Warm] Capillary Refill: [Left:< 3 seconds] [Right:< 3 seconds] Toe Nail Assessment Left: Right: Thick: Yes Yes Discolored: Yes Yes Deformed: Yes Yes Improper Length and Hygiene: Yes Yes Electronic Signature(s) Signed: 03/30/2017 4:51:03 PM By: Elpidio Eric BSN, RN Entered By: Elpidio Eric on 03/30/2017 14:38:02 Mand, [name] ([id number]) -------------------------------------------------------------------------------- Multi Wound Chart  Details [name] Date of Service: 03/30/2017 2:00 PM Medical Record Patient Account Number: [id number] Number: Treating RN: Clover Mealy, RN, BSN, [place] Sink 1931-06-28 [phone] y.o. Other Clinician: Date of Birth/Sex: Female) Treating ROBSON, MICHAEL Primary Care Alexandre Faries: Guy Begin Bianco Cange/Extender: G Referring Kemia Wendel: Guy Begin Weeks in Treatment: 7 Vital Signs Height(in): 66 Pulse(bpm): 77 Weight(lbs): 159 Blood Pressure 124/65 (mmHg): Body Mass Index(BMI): 26 Temperature(F): 97.7 Respiratory Rate 17 (breaths/min): Photos: [2:No Photos] [3:No Photos] [4:No Photos] Wound Location: [2:Right Malleolus - Lateral] [3:Left Lower Leg - Medial] [4:Right Lower Leg - Medial] Wounding Event: [2:Gradually Appeared] [3:Gradually Appeared] [4:Gradually Appeared] Primary Etiology: [2:Venous Leg Ulcer] [3:Venous Leg  Ulcer] [4:Venous Leg Ulcer] Comorbid History: [2:Cataracts, Arrhythmia, Hypertension, Osteoarthritis] [3:Cataracts, Arrhythmia, Hypertension, Osteoarthritis] [4:Cataracts, Arrhythmia, Hypertension, Osteoarthritis] Date Acquired: [2:01/19/2017] [3:01/19/2017] [4:02/16/2017] Weeks of Treatment: [2:7] [3:7] [4:6] Wound Status: [2:Open] [3:Open] [4:Open] Measurements L x W x D 3.5x2x0.2 [3:2.8x1.4x0.1] [4:8x5x0.1] (cm) Area (cm) : [2:5.498] [3:3.079] [4:31.416] Volume (cm) : [2:1.1] [3:0.308] [4:3.142] % Reduction in Area: [2:-32.80%] [3:-683.50%] [4:-19910.20%] % Reduction in Volume: -165.70% [3:-689.70%] [4:-19537.50%] Classification: [2:Partial Thickness] [3:Partial Thickness] [4:Partial Thickness] Exudate Amount: [2:Medium] [3:Medium] [4:Large] Exudate Type: [2:Serosanguineous] [3:Serosanguineous] [4:Serosanguineous] Exudate Color: [2:red, brown] [3:red, brown] [4:red, brown] Wound Margin: [2:Distinct, outline attached] [3:Distinct, outline attached] [4:Distinct, outline attached] Granulation Amount: [2:Small (1-33%)] [3:Small (1-33%)] [4:Small  (1-33%)] Granulation Quality: [2:Pink, Pale] [3:Pink, Pale] [4:Pink, Pale] Necrotic Amount: [2:Large (67-100%)] [3:Large (67-100%)] [4:Large (67-100%)] Exposed Structures: [2:Fat Layer (Subcutaneous Tissue) Exposed: Yes Fascia: No Tendon: No] [3:Fat Layer (Subcutaneous Tissue) Exposed: Yes Fascia: No Tendon: No] [4:Fat Layer (Subcutaneous Tissue) Exposed: Yes Fascia: No Tendon: No] Muscle: No Muscle: No Muscle: No Joint: No Joint: No Joint: No Bone: No Bone: No Bone: No Epithelialization: None None None Debridement: Debridement ([id number]- Debridement ([id number]- Debridement ([id number]) 11047) 11047) Pre-procedure 14:57 14:57 14:57 Verification/Time Out Taken: Pain Control: Lidocaine 4% Topical Lidocaine 4% Topical Lidocaine 4% Topical Solution Solution Solution Tissue Debrided: Fibrin/Slough, Fibrin/Slough, Fibrin/Slough, Subcutaneous Subcutaneous Subcutaneous Level: Skin/Subcutaneous Skin/Subcutaneous Skin/Subcutaneous Tissue/Muscle Tissue/Muscle Tissue/Muscle Debridement Area (sq 7 3.92 8 cm): Instrument: Curette Curette Curette Bleeding: Minimum Minimum Minimum Hemostasis Achieved: Pressure Pressure Pressure Procedural Pain: 0 0 0 Post Procedural Pain: 0 0 0 Debridement Treatment Procedure was tolerated Procedure was tolerated Procedure was tolerated Response: well well well Post Debridement 3.5x2x0.2 2.8x1.4x0.1 8x5x0.1 Measurements L x W x D (cm) Post Debridement 1.1 0.308 3.142 Volume: (cm) Periwound Skin Texture: Excoriation: No Excoriation: No Excoriation: No Induration: No Induration: No Induration: No Callus: No Callus: No Callus: No Crepitus: No Crepitus: No Crepitus: No Rash: No Rash: No Rash: No Scarring: No Scarring: No Scarring: No Periwound Skin Maceration: No Maceration: No Maceration: No Moisture: Dry/Scaly: No Dry/Scaly: No Dry/Scaly: No Periwound Skin Color: Hemosiderin Staining: Yes Hemosiderin Staining: Yes Hemosiderin Staining:  Yes Mottled: Yes Mottled: Yes Mottled: Yes Atrophie Blanche: No Atrophie Blanche: No Atrophie Blanche: No Cyanosis: No Cyanosis: No Cyanosis: No Ecchymosis: No Ecchymosis: No Ecchymosis: No Erythema: No Erythema: No Erythema: No Pallor: No Pallor: No Pallor: No Rubor: No Rubor: No Rubor: No Temperature: No Abnormality No Abnormality No Abnormality Tenderness on Yes Yes Yes Palpation: Wound Preparation: Ulcer Cleansing: Other: Ulcer Cleansing: Other: Ulcer Cleansing: Other: surg scrub and water surg scrub and water surg scrub and water Kozar, Latifah J. ([id number]) Topical Anesthetic Topical Anesthetic Topical Anesthetic Applied: Other: lidocaine Applied: Other: lidocaine 4 Applied: Other: lidocaine 4% % 4% Procedures Performed: Debridement Debridement Debridement Treatment Notes Wound #2 (Right, Lateral Malleolus) 1. Cleansed with: Clean wound with Normal Saline 4. Dressing Applied: Santyl Ointment 5. Secondary Dressing Applied Kerlix/Conform Non-Adherent pad 7. Secured with Other (specify in notes) Notes Ace wrap Wound #3 (Left, Medial Lower Leg) 1. Cleansed with: Clean wound with Normal Saline 4. Dressing Applied: Santyl Ointment 5. Secondary Dressing Applied Kerlix/Conform Non-Adherent pad 7. Secured with Other (specify in notes) Notes Ace wrap Wound #4 (Right, Medial Lower Leg) 1. Cleansed with: Clean wound with Normal Saline 4. Dressing Applied: Santyl Ointment 5. Secondary Dressing Applied Kerlix/Conform Non-Adherent pad 7. Secured with Other (specify in notes) Notes Ace wrap ALLAYAH, RAINERI ([id number]) Electronic Signature(s) Signed: 03/31/2017 7:57:33 AM By: Baltazar Najjar MD Entered By: Baltazar Najjar on  03/30/2017 15:16:46 ANGELLINA, FERDINAND ([id number]) -------------------------------------------------------------------------------- Multi-Disciplinary Care Plan Details Patient Name: DAUNE, DIVIRGILIO. Date of Service: 03/30/2017 2:00  PM Medical Record Patient Account Number: [id number] Number: Treating RN: Clover Mealy, RN, BSN, [place] Sink 1931-04-05 [phone] y.o. Other Clinician: Date of Birth/Sex: Female) Treating ROBSON, MICHAEL Primary Care Kimiye Strathman: Guy Begin Armondo Cech/Extender: G Referring Danzel Marszalek: [name] in Treatment: 7 Active Inactive ` Abuse / Safety / Falls / Self Care Management Nursing Diagnoses: Potential for falls Goals: Patient will remain injury free Date Initiated: 02/09/2017 Target Resolution Date: 04/17/2017 Goal Status: Active Interventions: Assess fall risk on admission and as needed Assess self care needs on admission and as needed Notes: ` Nutrition Nursing Diagnoses: Imbalanced nutrition Goals: Patient/caregiver agrees to and verbalizes understanding of need to use nutritional supplements and/or vitamins as prescribed Date Initiated: 02/09/2017 Target Resolution Date: 04/17/2017 Goal Status: Active Interventions: Assess patient nutrition upon admission and as needed per policy Notes: ` Orientation to the Wound Care Program DWANA, GARIN ([id number]) Nursing Diagnoses: Knowledge deficit related to the wound healing center program Goals: Patient/caregiver will verbalize understanding of the Wound Healing Center Program Date Initiated: 02/09/2017 Target Resolution Date: 02/20/2017 Goal Status: Active Interventions: Provide education on orientation to the wound center Notes: ` Pain, Acute or Chronic Nursing Diagnoses: Pain, acute or chronic: actual or potential Potential alteration in comfort, pain Goals: Patient/caregiver will verbalize adequate pain control between visits Date Initiated: 02/09/2017 Target Resolution Date: 04/17/2017 Goal Status: Active Interventions: Assess comfort goal upon admission Complete pain assessment as per visit requirements Notes: ` Wound/Skin Impairment Nursing Diagnoses: Impaired tissue integrity Knowledge deficit related to  smoking impact on wound healing Knowledge deficit related to ulceration/compromised skin integrity Goals: Ulcer/skin breakdown will have a volume reduction of 80% by week 12 Date Initiated: 02/09/2017 Target Resolution Date: 04/10/2017 Goal Status: Active Interventions: Assess patient/caregiver ability to perform ulcer/skin care regimen upon admission and as needed SALVADOR, BIGBEE ([id number]) Assess ulceration(s) every visit Notes: Electronic Signature(s) Signed: 03/30/2017 4:51:03 PM By: Elpidio Eric BSN, RN Entered By: Elpidio Eric on 03/30/2017 14:55:34 Rabe, [name] ([id number]) -------------------------------------------------------------------------------- Pain Assessment Details [name] Date of Service: 03/30/2017 2:00 PM Medical Record Patient Account Number: [id number] Number: Treating RN: Clover Mealy, RN, BSN, [place] Sink 07-Sep-1931 [phone] y.o. Other Clinician: Date of Birth/Sex: Female) Treating ROBSON, MICHAEL Primary Care Imya Mance: Guy Begin Paton Crum/Extender: G Referring Mahogany Torrance: [name] in Treatment: 7 Active Problems Location of Pain Severity and Description of Pain Patient Has Paino Yes Site Locations Pain Location: Pain in Ulcers With Dressing Change: No Rate the pain. Current Pain Level: 3 Character of Pain Describe the Pain: Tender Pain Management and Medication Current Pain Management: How does your wound impact your activities of daily livingo Sleep: Yes Bathing: Yes Appetite: Yes Relationship With Others: Yes Bladder Continence: Yes Emotions: Yes Bowel Continence: Yes Work: Yes Toileting: Yes Drive: Yes Dressing: Yes Hobbies: Yes Electronic Signature(s) Signed: 03/30/2017 4:51:03 PM By: Elpidio Eric BSN, RN Entered By: Elpidio Eric on 03/30/2017 14:35:43 Prophete, [name] ([id number]) -------------------------------------------------------------------------------- Patient/Caregiver Education  Details [name] Date of Service: 03/30/2017 2:00 PM Medical Record Patient Account Number: [id number] Number: Treating RN: Clover Mealy, RN, BSN, [place] Sink 05-01-1931 [phone] y.o. Other Clinician: Date of Birth/Gender: Female) Treating ROBSON, MICHAEL Primary Care Physician: Guy Begin Physician/Extender: G Referring Physician: [name] in Treatment: 7 Education Assessment Education Provided To: Patient Education Topics Provided Welcome To The Wound Care Center: Methods: Explain/Verbal Responses: State content correctly Wound  Debridement: Methods: Explain/Verbal Responses: State content correctly Wound/Skin Impairment: Methods: Explain/Verbal Responses: State content correctly Electronic Signature(s) Signed: 03/30/2017 4:51:03 PM By: Elpidio Eric BSN, RN Entered By: Elpidio Eric on 03/30/2017 15:14:31 Aumiller, [name] ([id number]) -------------------------------------------------------------------------------- Wound Assessment Details [name] Date of Service: 03/30/2017 2:00 PM Medical Record Patient Account Number: [id number] Number: Treating RN: Clover Mealy, RN, BSN, [place] Sink [birth year] [phone] y.o. Other Clinician: Date of Birth/Sex: Female) Treating ROBSON, MICHAEL Primary Care Shaney Deckman: Guy Begin Felina Tello/Extender: G Referring Durant Scibilia: Guy Begin Weeks in Treatment: 7 Wound Status Wound Number: 2 Primary Venous Leg Ulcer Etiology: Wound Location: Right Malleolus - Lateral Wound Status: Open Wounding Event: Gradually Appeared Comorbid Cataracts, Arrhythmia, Hypertension, Date Acquired: 01/19/2017 History: Osteoarthritis Weeks Of Treatment: 7 Clustered Wound: No Photos Photo Uploaded By: Elpidio Eric on 03/30/2017 17:04:51 Wound Measurements Length: (cm) 3.5 Width: (cm) 2 Depth: (cm) 0.2 Area: (cm) 5.498 Volume: (cm) 1.1 % Reduction in Area: -32.8% % Reduction in Volume:  -165.7% Epithelialization: None Tunneling: No Undermining: No Wound Description Classification: Partial Thickness Foul Odor Aft Wound Margin: Distinct, outline attached Slough/Fibrin Exudate Amount: Medium Exudate Type: Serosanguineous Exudate Color: red, brown er Cleansing: No o Yes Wound Bed Granulation Amount: Small (1-33%) Exposed Structure Granulation Quality: Pink, Pale Fascia Exposed: No Necrotic Amount: Large (67-100%) Fat Layer (Subcutaneous Tissue) Exposed: Yes Solar, Kaliegh J. ([id number]) Necrotic Quality: Adherent Slough Tendon Exposed: No Muscle Exposed: No Joint Exposed: No Bone Exposed: No Periwound Skin Texture Texture Color No Abnormalities Noted: No No Abnormalities Noted: No Callus: No Atrophie Blanche: No Crepitus: No Cyanosis: No Excoriation: No Ecchymosis: No Induration: No Erythema: No Rash: No Hemosiderin Staining: Yes Scarring: No Mottled: Yes Pallor: No Moisture Rubor: No No Abnormalities Noted: No Dry / Scaly: No Temperature / Pain Maceration: No Temperature: No Abnormality Tenderness on Palpation: Yes Wound Preparation Ulcer Cleansing: Other: surg scrub and water, Topical Anesthetic Applied: Other: lidocaine 4%, Treatment Notes Wound #2 (Right, Lateral Malleolus) 1. Cleansed with: Clean wound with Normal Saline 4. Dressing Applied: Santyl Ointment 5. Secondary Dressing Applied Kerlix/Conform Non-Adherent pad 7. Secured with Other (specify in notes) Notes Ace wrap Electronic Signature(s) Signed: 03/30/2017 4:51:03 PM By: Elpidio Eric BSN, RN Entered By: Elpidio Eric on 03/30/2017 14:46:58 Krieger, [name] ([id number]) -------------------------------------------------------------------------------- Wound Assessment Details [name] Date of Service: 03/30/2017 2:00 PM Medical Record Patient Account Number: [id number] Number: Treating RN: Clover Mealy, RN, BSN, [place] Sink 12-Feb-1931 [phone] y.o. Other  Clinician: Date of Birth/Sex: Female) Treating ROBSON, MICHAEL Primary Care Tida Saner: Guy Begin Laiken Sandy/Extender: G Referring Estefano Victory: Guy Begin Weeks in Treatment: 7 Wound Status Wound Number: 3 Primary Venous Leg Ulcer Etiology: Wound Location: Left Lower Leg - Medial Wound Status: Open Wounding Event: Gradually Appeared Comorbid Cataracts, Arrhythmia, Hypertension, Date Acquired: 01/19/2017 History: Osteoarthritis Weeks Of Treatment: 7 Clustered Wound: No Photos Photo Uploaded By: Elpidio Eric on 03/30/2017 17:05:36 Wound Measurements Length: (cm) 2.8 Width: (cm) 1.4 Depth: (cm) 0.1 Area: (cm) 3.079 Volume: (cm) 0.308 % Reduction in Area: -683.5% % Reduction in Volume: -689.7% Epithelialization: None Tunneling: No Undermining: No Wound Description Classification: Partial Thickness Wound Margin: Distinct, outline attached Exudate Amount: Medium Leonhard, Faelyn J. ([id number]) Foul Odor After Cleansing: No Slough/Fibrino Yes Exudate Type: Serosanguineous Exudate Color: red, brown Wound Bed Granulation Amount: Small (1-33%) Exposed Structure Granulation Quality: Pink, Pale Fascia Exposed: No Necrotic Amount: Large (67-100%) Fat Layer (Subcutaneous Tissue) Exposed: Yes Necrotic Quality: Adherent Slough Tendon Exposed: No Muscle Exposed: No Joint Exposed: No Bone Exposed: No Periwound Skin  Texture Texture Color No Abnormalities Noted: No No Abnormalities Noted: No Callus: No Atrophie Blanche: No Crepitus: No Cyanosis: No Excoriation: No Ecchymosis: No Induration: No Erythema: No Rash: No Hemosiderin Staining: Yes Scarring: No Mottled: Yes Pallor: No Moisture Rubor: No No Abnormalities Noted: No Dry / Scaly: No Temperature / Pain Maceration: No Temperature: No Abnormality Tenderness on Palpation: Yes Wound Preparation Ulcer Cleansing: Other: surg scrub and water, Topical Anesthetic Applied: Other: lidocaine 4 %, Treatment  Notes Wound #3 (Left, Medial Lower Leg) 1. Cleansed with: Clean wound with Normal Saline 4. Dressing Applied: Santyl Ointment 5. Secondary Dressing Applied Kerlix/Conform Non-Adherent pad 7. Secured with Other (specify in notes) Notes Ace wrap Electronic Signature(s) Signed: 03/30/2017 4:51:03 PM By: Elpidio Eric BSN, RN Housel, [place] ([id number]) Entered By: Elpidio Eric on 03/30/2017 14:47:20 Bang, [name] ([id number]) -------------------------------------------------------------------------------- Wound Assessment Details [name] Date of Service: 03/30/2017 2:00 PM Medical Record Patient Account Number: [id number] Number: Treating RN: Clover Mealy, RN, BSN, [place] Sink 1931-07-21 [phone] y.o. Other Clinician: Date of Birth/Sex: Female) Treating ROBSON, MICHAEL Primary Care Judeen Geralds: Guy Begin Brentt Fread/Extender: G Referring Tasean Mancha: Guy Begin Weeks in Treatment: 7 Wound Status Wound Number: 4 Primary Venous Leg Ulcer Etiology: Wound Location: Right Lower Leg - Medial Wound Status: Open Wounding Event: Gradually Appeared Comorbid Cataracts, Arrhythmia, Hypertension, Date Acquired: 02/16/2017 History: Osteoarthritis Weeks Of Treatment: 6 Clustered Wound: No Photos Photo Uploaded By: Elpidio Eric on 03/30/2017 17:05:37 Wound Measurements Length: (cm) 8 Width: (cm) 5 Depth: (cm) 0.1 Area: (cm) 31.416 Volume: (cm) 3.142 % Reduction in Area: -19910.2% % Reduction in Volume: -19537.5% Epithelialization: None Tunneling: No Undermining: No Wound Description Classification: Partial Thickness Wound Margin: Distinct, outline attached Exudate Amount: Large Maura, Lively J. ([id number]) Foul Odor After Cleansing: No Slough/Fibrino Yes Exudate Type: Serosanguineous Exudate Color: red, brown Wound Bed Granulation Amount: Small (1-33%) Exposed Structure Granulation Quality: Pink, Pale Fascia Exposed: No Necrotic Amount: Large  (67-100%) Fat Layer (Subcutaneous Tissue) Exposed: Yes Necrotic Quality: Adherent Slough Tendon Exposed: No Muscle Exposed: No Joint Exposed: No Bone Exposed: No Periwound Skin Texture Texture Color No Abnormalities Noted: No No Abnormalities Noted: No Callus: No Atrophie Blanche: No Crepitus: No Cyanosis: No Excoriation: No Ecchymosis: No Induration: No Erythema: No Rash: No Hemosiderin Staining: Yes Scarring: No Mottled: Yes Pallor: No Moisture Rubor: No No Abnormalities Noted: No Dry / Scaly: No Temperature / Pain Maceration: No Temperature: No Abnormality Tenderness on Palpation: Yes Wound Preparation Ulcer Cleansing: Other: surg scrub and water, Topical Anesthetic Applied: Other: lidocaine 4%, Treatment Notes Wound #4 (Right, Medial Lower Leg) 1. Cleansed with: Clean wound with Normal Saline 4. Dressing Applied: Santyl Ointment 5. Secondary Dressing Applied Kerlix/Conform Non-Adherent pad 7. Secured with Other (specify in notes) Notes Ace wrap Electronic Signature(s) Signed: 03/30/2017 4:51:03 PM By: Elpidio Eric BSN, RN Traister, Agenda ([id number]) Entered By: Elpidio Eric on 03/30/2017 14:47:38 Preusser, [name] ([id number]) -------------------------------------------------------------------------------- Vitals Details [name] Date of Service: 03/30/2017 2:00 PM Medical Record Patient Account Number: [id number] Number: Treating RN: Clover Mealy, RN, BSN, [place] Sink January 22, 1931 [phone] y.o. Other Clinician: Date of Birth/Sex: Female) Treating ROBSON, MICHAEL Primary Care Jillienne Egner: Guy Begin Gilberte Gorley/Extender: G Referring Aysa Larivee: Guy Begin Weeks in Treatment: 7 Vital Signs Time Taken: 14:38 Temperature (F): 97.7 Height (in): 66 Pulse (bpm): 77 Weight (lbs): 159 Respiratory Rate (breaths/min): 17 Body Mass Index (BMI): 25.7 Blood Pressure (mmHg): 124/65 Reference Range: 80 - 120 mg / dl Electronic  Signature(s) Signed: 03/30/2017 4:51:03 PM By: Elpidio Eric  BSN, RN Entered By: Elpidio Eric on 03/30/2017 14:41:26

## 2017-04-05 ENCOUNTER — Ambulatory Visit: Payer: Medicare Other | Admitting: Cardiovascular Disease

## 2017-04-05 ENCOUNTER — Telehealth: Payer: Self-pay | Admitting: Cardiovascular Disease

## 2017-04-06 ENCOUNTER — Ambulatory Visit: Payer: Medicare Other | Admitting: Internal Medicine

## 2017-04-13 ENCOUNTER — Encounter: Payer: Medicare Other | Attending: Internal Medicine | Admitting: Internal Medicine

## 2017-04-13 DIAGNOSIS — L97313 Non-pressure chronic ulcer of right ankle with necrosis of muscle: Secondary | ICD-10-CM | POA: Insufficient documentation

## 2017-04-13 DIAGNOSIS — L97221 Non-pressure chronic ulcer of left calf limited to breakdown of skin: Secondary | ICD-10-CM | POA: Diagnosis not present

## 2017-04-13 DIAGNOSIS — I87333 Chronic venous hypertension (idiopathic) with ulcer and inflammation of bilateral lower extremity: Secondary | ICD-10-CM | POA: Insufficient documentation

## 2017-04-13 DIAGNOSIS — I10 Essential (primary) hypertension: Secondary | ICD-10-CM | POA: Insufficient documentation

## 2017-04-13 DIAGNOSIS — I70243 Atherosclerosis of native arteries of left leg with ulceration of ankle: Secondary | ICD-10-CM | POA: Insufficient documentation

## 2017-04-13 DIAGNOSIS — I70233 Atherosclerosis of native arteries of right leg with ulceration of ankle: Secondary | ICD-10-CM | POA: Insufficient documentation

## 2017-04-14 ENCOUNTER — Encounter (HOSPITAL_COMMUNITY): Payer: Medicare Other

## 2017-04-15 NOTE — Progress Notes (Signed)
[name] ([id number]) Visit Report for 04/13/2017 Arrival Information Details [name], [name]. Date of Service: 04/13/2017 2:00 PM Medical Record Patient Account Number: [id number] Number: Treating RN: Huel Coventry 08/07/1931 (83 y.o. Other Clinician: Date of Birth/Sex: Female) Treating ROBSON, MICHAEL Primary Care Zekiel Torian: Guy Begin Zaylen Susman/Extender: G Referring Joeseph Verville: [name] in Treatment: 9 Visit Information History Since Last Visit Added or deleted any medications: No Patient Arrived: Wheel Chair Any new allergies or adverse reactions: No Arrival Time: 14:25 Had a fall or experienced change in No Accompanied By: son, Les Pou activities of daily living that may affect Transfer Assistance: None risk of falls: Patient Identification Verified: Yes Signs or symptoms of abuse/neglect since last No Secondary Verification Process Yes visito Completed: Hospitalized since last visit: No Patient Requires Transmission- No Has Dressing in Place as Prescribed: Yes Based Precautions: Pain Present Now: No Patient Has Alerts: Yes Patient Alerts: Patient on Blood Thinner Electronic Signature(s) Signed: 04/14/2017 8:39:22 AM By: Elliot Gurney, RN, BSN, Kim RN, BSN Entered By: Elliot Gurney, RN, BSN, Kim on 04/13/2017 14:26:02 [name] ([id number]) -------------------------------------------------------------------------------- Encounter Discharge Information Details [name] Date of Service: 04/13/2017 2:00 PM Medical Record Patient Account Number: [id number] Number: Treating RN: Clover Mealy, RN, BSN, [place] Sink June 21, 1931 [phone] y.o. Other Clinician: Huel Coventry Date of Birth/Sex: Female) Treating Leanord Hawking MICHAEL Primary Care Jomarion Mish: Guy Begin Evona Westra/Extender: G Referring Colvin Blatt: [name] in Treatment: 9 Encounter Discharge Information Items Discharge Pain Level: 0 Discharge Condition:  Stable Ambulatory Status: Wheelchair Discharge Destination: Home Transportation: Private Auto Accompanied By: son Schedule Follow-up Appointment: No Medication Reconciliation completed No and provided to Patient/Care Georg Ang: Provided on Clinical Summary of Care: 04/13/2017 Form Type Recipient Paper Patient CF Electronic Signature(s) Signed: 04/13/2017 3:31:23 PM By: Elpidio Eric BSN, RN Previous Signature: 04/13/2017 3:19:47 PM Version By: Gwenlyn Perking Entered By: Elpidio Eric on 04/13/2017 15:31:23 Sago, [name] ([id number]) -------------------------------------------------------------------------------- Lower Extremity Assessment Details [name] Date of Service: 04/13/2017 2:00 PM Medical Record Patient Account Number: [id number] Number: Treating RN: Clover Mealy, RN, BSN, [place] Sink [birth year] [phone] y.o. Other Clinician: Huel Coventry Date of Birth/Sex: Female) Treating ROBSON, MICHAEL Primary Care Lindy Pennisi: Guy Begin Lavert Matousek/Extender: G Referring Aishwarya Shiplett: Guy Begin Weeks in Treatment: 9 Edema Assessment Assessed: [Left: No] [Right: No] Edema: [Left: No] [Right: No] Vascular Assessment Claudication: Claudication Assessment [Left:None] [Right:None] Pulses: Dorsalis Pedis Palpable: [Left:Yes] [Right:Yes] Posterior Tibial Extremity colors, hair growth, and conditions: Extremity Color: [Left:Hyperpigmented] [Right:Hyperpigmented] Hair Growth on Extremity: [Left:No] [Right:No] Temperature of Extremity: [Left:Warm] [Right:Warm] Capillary Refill: [Left:< 3 seconds] [Right:< 3 seconds] Toe Nail Assessment Left: Right: Thick: Yes Yes Discolored: Yes Yes Deformed: Yes Yes Improper Length and Hygiene: Yes Yes Electronic Signature(s) Signed: 04/13/2017 2:42:22 PM By: Elpidio Eric BSN, RN Entered By: Elpidio Eric on 04/13/2017 14:42:22 Posada, [name] ([id number]) -------------------------------------------------------------------------------- Multi  Wound Chart Details [name] Date of Service: 04/13/2017 2:00 PM Medical Record Patient Account Number: [id number] Number: Treating RN: Clover Mealy, RN, BSN, [place] Sink 25-Jan-1931 [phone] y.o. Other Clinician: Huel Coventry Date of Birth/Sex: Female) Treating ROBSON, MICHAEL Primary Care Saida Lonon: Guy Begin Uriel Horkey/Extender: G Referring Venna Berberich: Guy Begin Weeks in Treatment: 9 Vital Signs Height(in): 66 Pulse(bpm): 80 Weight(lbs): 159 Blood Pressure 120/71 (mmHg): Body Mass Index(BMI): 26 Temperature(F): 97.4 Respiratory Rate 16 (breaths/min): Photos: [2:No Photos] [3:No Photos] [4:No Photos] Wound Location: [2:Right, Lateral Malleolus] [3:Left, Medial Lower Leg] [4:Right, Medial Lower Leg] Wounding Event: [2:Gradually Appeared] [3:Gradually Appeared] [4:Gradually Appeared] Primary Etiology: [2:Venous Leg Ulcer] [  3:Venous Leg Ulcer] [4:Venous Leg Ulcer] Date Acquired: [2:01/19/2017] [3:01/19/2017] [4:02/16/2017] Weeks of Treatment: [2:9] [3:9] [4:8] Wound Status: [2:Open] [3:Open] [4:Open] Measurements L x W x D 2.7x1.6x0.2 [3:2.2x1.6x0.1] [4:6x5x0.1] (cm) Area (cm) : [2:3.393] [3:2.765] [4:23.562] Volume (cm) : [2:0.679] [3:0.276] [4:2.356] % Reduction in Area: [2:18.00%] [3:-603.60%] [4:-14907.60%] % Reduction in Volume: -64.00% [3:-607.70%] [4:-14625.00%] Classification: [2:Partial Thickness] [3:Partial Thickness] [4:Partial Thickness] Debridement: [2:Debridement (11042- 11047)] [3:Debridement (11042- 11047)] [4:N/A] Pre-procedure [2:14:59] [3:15:02] [4:N/A] Verification/Time Out Taken: Pain Control: [2:Lidocaine 4% Topical Solution] [3:Lidocaine 4% Topical Solution] [4:N/A] Tissue Debrided: [2:Fibrin/Slough, Subcutaneous] [3:Fibrin/Slough, Subcutaneous] [4:N/A] Level: [2:Skin/Subcutaneous Tissue] [3:Skin/Subcutaneous Tissue] [4:N/A] Debridement Area (sq [2:4.32] [3:3.52] [4:N/A] cm): Instrument: [2:Curette] [3:Curette] [4:N/A] Bleeding:  Minimum Minimum N/A Hemostasis Achieved: Pressure Pressure N/A Procedural Pain: 3 3 N/A Post Procedural Pain: 3 3 N/A Debridement Treatment Procedure was tolerated Procedure was tolerated N/A Response: well well Post Debridement 2.7x1.6x0.2 2.2x1.6x0.1 N/A Measurements L x W x D (cm) Post Debridement 0.679 0.276 N/A Volume: (cm) Periwound Skin Texture: No Abnormalities Noted No Abnormalities Noted No Abnormalities Noted Periwound Skin No Abnormalities Noted No Abnormalities Noted No Abnormalities Noted Moisture: Periwound Skin Color: No Abnormalities Noted No Abnormalities Noted No Abnormalities Noted Tenderness on No No No Palpation: Procedures Performed: Debridement Debridement N/A Treatment Notes Wound #2 (Right, Lateral Malleolus) 4. Dressing Applied: Santyl Ointment 5. Secondary Dressing Applied [place] ABD and Kerlix/Conform Notes ace wrap Wound #3 (Left, Medial Lower Leg) 4. Dressing Applied: Santyl Ointment 5. Secondary Dressing Applied [place] ABD and Kerlix/Conform Notes ace wrap Wound #4 (Right, Medial Lower Leg) 4. Dressing Applied: Santyl Ointment 5. Secondary Dressing Applied [place] ABD and Kerlix/Conform Notes ace wrap Jordan BandaFULLER, Nyree J. ([id number]) Electronic Signature(s) Signed: 04/14/2017 7:53:49 AM By: Baltazar Najjarobson, Michael MD Previous Signature: 04/13/2017 2:42:50 PM Version By: Elpidio EricAfful, Rita BSN, RN Entered By: Baltazar Najjarobson, Michael on 04/13/2017 15:35:37 Decoste, [name] ([id number]) -------------------------------------------------------------------------------- Multi-Disciplinary Care Plan Details Patient Name: Jordan BandaFULLER, Pristine J. Date of Service: 04/13/2017 2:00 PM Medical Record Patient Account Number: [id number] Number: Treating RN: Clover MealyAfful, RN, BSN, [place] Sinkita Apr 04, 1931 [phone](83 y.o. Other Clinician: Huel CoventryWoody, Kim Date of Birth/Sex: Female) Treating Leanord HawkingOBSON, MICHAEL Primary Care Leeman Johnsey: [name], NARGES Mithran Strike/Extender: G Referring Berenise Hunton:  Gabriel RungFARAHI, NARGES Weeks in Treatment: 9 Active Inactive ` Abuse / Safety / Falls / Self Care Management Nursing Diagnoses: Potential for falls Goals: Patient will remain injury free Date Initiated: 02/09/2017 Target Resolution Date: 04/17/2017 Goal Status: Active Interventions: Assess fall risk on admission and as needed Assess self care needs on admission and as needed Notes: ` Nutrition Nursing Diagnoses: Imbalanced nutrition Goals: Patient/caregiver agrees to and verbalizes understanding of need to use nutritional supplements and/or vitamins as prescribed Date Initiated: 02/09/2017 Target Resolution Date: 04/17/2017 Goal Status: Active Interventions: Assess patient nutrition upon admission and as needed per policy Notes: ` Orientation to the Wound Care Program Jordan BandaFULLER, Zyion J. ([id number]) Nursing Diagnoses: Knowledge deficit related to the wound healing center program Goals: Patient/caregiver will verbalize understanding of the Wound Healing Center Program Date Initiated: 02/09/2017 Target Resolution Date: 02/20/2017 Goal Status: Active Interventions: Provide education on orientation to the wound center Notes: ` Pain, Acute or Chronic Nursing Diagnoses: Pain, acute or chronic: actual or potential Potential alteration in comfort, pain Goals: Patient/caregiver will verbalize adequate pain control between visits Date Initiated: 02/09/2017 Target Resolution Date: 04/17/2017 Goal Status: Active Interventions: Assess comfort goal upon admission Complete pain assessment as per visit requirements Notes: ` Wound/Skin Impairment Nursing Diagnoses: Impaired tissue integrity Knowledge deficit related to smoking impact on wound healing Knowledge  deficit related to ulceration/compromised skin integrity Goals: Ulcer/skin breakdown will have a volume reduction of 80% by week 12 Date Initiated: 02/09/2017 Target Resolution Date: 04/10/2017 Goal Status:  Active Interventions: Assess patient/caregiver ability to perform ulcer/skin care regimen upon admission and as needed TAMMELA, BALES ([id number]) Assess ulceration(s) every visit Notes: Electronic Signature(s) Signed: 04/13/2017 2:42:41 PM By: Elpidio Eric BSN, RN Entered By: Elpidio Eric on 04/13/2017 14:42:38 Birks, [name] ([id number]) -------------------------------------------------------------------------------- Pain Assessment Details [name] Date of Service: 04/13/2017 2:00 PM Medical Record Patient Account Number: [id number] Number: Treating RN: Huel Coventry 08/12/1931 (83 y.o. Other Clinician: Huel Coventry Date of Birth/Sex: Female) Treating ROBSON, MICHAEL Primary Care Tommye Lehenbauer: Guy Begin Dohn Stclair/Extender: G Referring Sharnita Bogucki: [name] in Treatment: 9 Active Problems Location of Pain Severity and Description of Pain Patient Has Paino No Site Locations With Dressing Change: No Pain Management and Medication Current Pain Management: Goals for Pain Management tingling, not pain. Electronic Signature(s) Signed: 04/14/2017 8:39:22 AM By: Elliot Gurney, RN, BSN, Kim RN, BSN Entered By: Elliot Gurney, RN, BSN, Kim on 04/13/2017 14:26:45 [name] ([id number]) -------------------------------------------------------------------------------- Patient/Caregiver Education Details Patient Name: STEPHANNIE, BRONER. Date of Service: 04/13/2017 2:00 PM Medical Record Patient Account Number: [id number] Number: Treating RN: Clover Mealy, RN, BSN, [place] Sink 1930/12/15 [phone] y.o. Other Clinician: Huel Coventry Date of Birth/Gender: Female) Treating Baltazar Najjar Primary Care Physician: Guy Begin Physician/Extender: G Referring Physician: [name] in Treatment: 9 Education Assessment Education Provided To: Patient Education Topics Provided Welcome To The Wound Care Center: Methods: Explain/Verbal Responses: State content  correctly Wound Debridement: Methods: Explain/Verbal Responses: State content correctly Wound/Skin Impairment: Methods: Explain/Verbal Responses: State content correctly Electronic Signature(s) Signed: 04/13/2017 4:25:14 PM By: Elpidio Eric BSN, RN Entered By: Elpidio Eric on 04/13/2017 15:31:47 Kucharski, Cerissa Shela Commons ([id number]) -------------------------------------------------------------------------------- Wound Assessment Details [name] Date of Service: 04/13/2017 2:00 PM Medical Record Patient Account Number: [id number] Number: Treating RN: Huel Coventry 1931/12/10 (83 y.o. Other Clinician: Huel Coventry Date of Birth/Sex: Female) Treating ROBSON, MICHAEL Primary Care Apple Dearmas: Guy Begin Azreal Stthomas/Extender: G Referring Aseel Uhde: Guy Begin Weeks in Treatment: 9 Wound Status Wound Number: 2 Primary Etiology: Venous Leg Ulcer Wound Location: Right, Lateral Malleolus Wound Status: Open Wounding Event: Gradually Appeared Date Acquired: 01/19/2017 Weeks Of Treatment: 9 Clustered Wound: No Photos Photo Uploaded By: Elpidio Eric on 04/13/2017 16:29:37 Wound Measurements Length: (cm) 2.7 Width: (cm) 1.6 Depth: (cm) 0.2 Area: (cm) 3.393 Volume: (cm) 0.679 % Reduction in Area: 18% % Reduction in Volume: -64% Wound Description Classification: Partial Thickness Periwound Skin Texture Texture Color No Abnormalities Noted: No No Abnormalities Noted: No Moisture No Abnormalities Noted: No Treatment Notes Breese, Shy J. ([id number]) Wound #2 (Right, Lateral Malleolus) 4. Dressing Applied: Santyl Ointment 5. Secondary Dressing Applied [place] ABD and Kerlix/Conform Notes ace wrap Electronic Signature(s) Signed: 04/14/2017 8:39:22 AM By: Elliot Gurney, RN, BSN, Kim RN, BSN Entered By: Elliot Gurney, RN, BSN, Kim on 04/13/2017 14:35:42 [place], [name] ([id number]) -------------------------------------------------------------------------------- Wound  Assessment Details Patient Name: SHAQUAYA, WUELLNER. Date of Service: 04/13/2017 2:00 PM Medical Record Patient Account Number: [id number] Number: Treating RN: Huel Coventry Jul 24, 1931 (83 y.o. Other Clinician: Huel Coventry Date of Birth/Sex: Female) Treating ROBSON, MICHAEL Primary Care Ferrel Simington: Guy Begin Airika Alkhatib/Extender: G Referring Kadarious Dikes: Guy Begin Weeks in Treatment: 9 Wound Status Wound Number: 3 Primary Etiology: Venous Leg Ulcer Wound Location: Left, Medial Lower Leg Wound Status: Open Wounding Event: Gradually Appeared Date Acquired: 01/19/2017 Weeks Of Treatment: 9 Clustered Wound: No  Photos Photo Uploaded By: Elpidio Eric on 04/13/2017 16:29:38 Wound Measurements Length: (cm) 2.2 Width: (cm) 1.6 Depth: (cm) 0.1 Area: (cm) 2.765 Volume: (cm) 0.276 % Reduction in Area: -603.6% % Reduction in Volume: -607.7% Wound Description Classification: Partial Thickness Periwound Skin Texture Texture Color No Abnormalities Noted: No No Abnormalities Noted: No Moisture No Abnormalities Noted: No Treatment Notes Bolick, Miel J. ([id number]) Wound #3 (Left, Medial Lower Leg) 4. Dressing Applied: Santyl Ointment 5. Secondary Dressing Applied [place] ABD and Kerlix/Conform Notes ace wrap Electronic Signature(s) Signed: 04/14/2017 8:39:22 AM By: Elliot Gurney, RN, BSN, Kim RN, BSN Entered By: Elliot Gurney, RN, BSN, Kim on 04/13/2017 14:35:43 Knotts, [name] ([id number]) -------------------------------------------------------------------------------- Wound Assessment Details Patient Name: CHRISTALYNN, BOISE. Date of Service: 04/13/2017 2:00 PM Medical Record Patient Account Number: [id number] Number: Treating RN: Huel Coventry 24-Apr-1931 (83 y.o. Other Clinician: Huel Coventry Date of Birth/Sex: Female) Treating ROBSON, MICHAEL Primary Care Bertrand Vowels: Guy Begin Shreyansh Tiffany/Extender: G Referring Kira Hartl: Guy Begin Weeks in Treatment: 9 Wound  Status Wound Number: 4 Primary Etiology: Venous Leg Ulcer Wound Location: Right, Medial Lower Leg Wound Status: Open Wounding Event: Gradually Appeared Date Acquired: 02/16/2017 Weeks Of Treatment: 8 Clustered Wound: No Photos Photo Uploaded By: Elpidio Eric on 04/13/2017 16:30:47 Wound Measurements Length: (cm) 6 Width: (cm) 5 Depth: (cm) 0.1 Area: (cm) 23.562 Volume: (cm) 2.356 % Reduction in Area: -14907.6% % Reduction in Volume: -14625% Wound Description Classification: Partial Thickness Periwound Skin Texture Herbold, Lacresha J. ([id number]) Texture Color No Abnormalities Noted: No No Abnormalities Noted: No Moisture No Abnormalities Noted: No Treatment Notes Wound #4 (Right, Medial Lower Leg) 4. Dressing Applied: Santyl Ointment 5. Secondary Dressing Applied [place] ABD and Kerlix/Conform Notes ace wrap Electronic Signature(s) Signed: 04/14/2017 8:39:22 AM By: Elliot Gurney, RN, BSN, Kim RN, BSN Entered By: Elliot Gurney, RN, BSN, Kim on 04/13/2017 14:35:43 Shimp, [name] ([id number]) -------------------------------------------------------------------------------- Vitals Details [name] Date of Service: 04/13/2017 2:00 PM Medical Record Patient Account Number: [id number] Number: Treating RN: Huel Coventry [birth year] (83 y.o. Other Clinician: Huel Coventry Date of Birth/Sex: Female) Treating ROBSON, MICHAEL Primary Care Maheen Cwikla: Guy Begin Yolunda Kloos/Extender: G Referring Colbey Wirtanen: [name] in Treatment: 9 Vital Signs Time Taken: 14:26 Temperature (F): 97.4 Height (in): 66 Pulse (bpm): 80 Weight (lbs): 159 Respiratory Rate (breaths/min): 16 Body Mass Index (BMI): 25.7 Blood Pressure (mmHg): 120/71 Reference Range: 80 - 120 mg / dl Electronic Signature(s) Signed: 04/14/2017 8:39:22 AM By: Elliot Gurney, RN, BSN, Kim RN, BSN Entered By: Elliot Gurney, RN, BSN, Kim on 04/13/2017 14:27:08

## 2017-04-15 NOTE — Progress Notes (Signed)
[name] ([id number]) Visit Report for 04/13/2017 Chief Complaint Document Details [name], [name]. Date of Service: 04/13/2017 2:00 PM Medical Record Patient Account Number: [id number] Number: Treating RN: Jordan Mealy, RN, BSN, [place] Sink 13-May-1931 [phone] y.o. Other Clinician: Huel Hopkins Date of Birth/Sex: Female) Treating [name] Primary Care Provider: Guy Hopkins Provider/Extender: G Referring Provider: Gabriel Hopkins in Treatment: 9 Information Obtained from: Patient Chief Complaint the patient is here for follow-up evaluation of her bilateral lower extremity ulcers Electronic Signature(s) Signed: 04/14/2017 7:53:49 AM By: Jordan Najjar MD Entered By: [name] on 04/13/2017 15:36:42 [name] ([id number]) -------------------------------------------------------------------------------- Debridement Details [name] Date of Service: 04/13/2017 2:00 PM Medical Record Patient Account Number: [id number] Number: Treating RN: Jordan Mealy, RN, BSN, [place] Sink 1931/02/03 [phone] y.o. Other Clinician: Huel Hopkins Date of Birth/Sex: Female) Treating [name] Primary Care Provider: Guy Hopkins Provider/Extender: G Referring Provider: Gabriel Hopkins in Treatment: 9 Debridement Performed for Wound #2 Right,Lateral Malleolus Assessment: Performed By: Physician Jordan Caul, MD Debridement: Debridement Pre-procedure Yes - 14:59 Verification/Time Out Taken: Start Time: 14:59 Pain Control: Lidocaine 4% Topical Solution Level: Skin/Subcutaneous Tissue Total Area Debrided (L x 2.7 (cm) x 1.6 (cm) = 4.32 (cm) W): Tissue and other Non-Viable, Fibrin/Slough, Subcutaneous material debrided: Instrument: Curette Bleeding: Minimum Hemostasis Achieved: Pressure End Time: 15:02 Procedural Pain: 3 Post Procedural Pain: 3 Response to Treatment: Procedure was tolerated well Post Debridement Measurements of Total  Wound Length: (cm) 2.7 Width: (cm) 1.6 Depth: (cm) 0.2 Volume: (cm) 0.679 Character of Wound/Ulcer Post Requires Further Debridement Debridement: Severity of Tissue Post Debridement: Fat layer exposed Post Procedure Diagnosis Same as Pre-procedure Electronic Signature(s) Signed: 04/13/2017 4:25:14 PM By: [name] BSN, RN Signed: 04/14/2017 7:53:49 AM By: Jordan Najjar MD [name], [name] ([id number]) Entered By: [name] on 04/13/2017 15:35:54 [name], [name] ([id number]) -------------------------------------------------------------------------------- Debridement Details [name] Date of Service: 04/13/2017 2:00 PM Medical Record Patient Account Number: [id number] Number: Treating RN: Jordan Mealy, RN, BSN, [place] Sink [birth year] [phone] y.o. Other Clinician: Huel Hopkins Date of Birth/Sex: Female) Treating [name] Primary Care Provider: Guy Hopkins Provider/Extender: G Referring Provider: Gabriel Hopkins in Treatment: 9 Debridement Performed for Wound #3 Left,Medial Lower Leg Assessment: Performed By: Physician Jordan Caul, MD Debridement: Debridement Pre-procedure Yes - 15:02 Verification/Time Out Taken: Start Time: 15:02 Pain Control: Lidocaine 4% Topical Solution Level: Skin/Subcutaneous Tissue Total Area Debrided (L x 2.2 (cm) x 1.6 (cm) = 3.52 (cm) W): Tissue and other Non-Viable, Fibrin/Slough, Subcutaneous material debrided: Instrument: Curette Bleeding: Minimum Hemostasis Achieved: Pressure End Time: 15:03 Procedural Pain: 3 Post Procedural Pain: 3 Response to Treatment: Procedure was tolerated well Post Debridement Measurements of Total Wound Length: (cm) 2.2 Width: (cm) 1.6 Depth: (cm) 0.1 Volume: (cm) 0.276 Character of Wound/Ulcer Post Requires Further Debridement Debridement: Severity of Tissue Post Debridement: Fat layer exposed Post Procedure Diagnosis Same as Pre-procedure Electronic  Signature(s) Signed: 04/13/2017 4:25:14 PM By: [name] BSN, RN Signed: 04/14/2017 7:53:49 AM By: Jordan Najjar MD [name], [name] ([id number]) Entered By: [name] on 04/13/2017 15:36:20 [name] -------------------------------------------------------------------------------- HPI Details [name] Date of Service: 04/13/2017 2:00 PM Medical Record Patient Account Number: [id number] Number: Treating RN: Jordan Mealy, RN, BSN, [place] Sink [id number] y.o. Other Clinician: Huel Hopkins Date of Birth/Sex: Female) Treating [name] Primary Care Provider: Guy Hopkins Provider/Extender: G Referring Provider: Guy Hopkins Weeks in Treatment: 9 History of Present Illness HPI Description: 06/10/16; this  is an 83 year old woman who lives near [place] with family members. She saw her primary doctor roughly over a week ago and was referred to the ER for a cellulitis in the right medial leg. She was given a round of IV antibiotics in the emergency room and discharged on oral Keflex which she is taking. She is been left with a uncomfortable leg area on the right medial lower leg. Her ABIs in this clinic were 0.8 on the right 0.6 on the left. She does not describe claudication. She is not a diabetic. Lab work in the ER showed a normal BUN and creatinine on 6/20 at 18 and 0.92 respectively white count was 10.1 hemoglobin at 13.8. She did not have any imaging studies. I can see no vascular evaluations in Epic. She does not have a prior wound history 06/17/16; the area affected on her right medial ankle is an area I thought was due to tissue damage from cellulitis last week. She is going for venous reflux studies this afternoon, these were not ordered in this clinic at least not by me. The patient does not give a prior history of damage to the skin in this area although I wonder if she actually might not of noticed it. We have been using's Aquacel Ag  under a wrap although we will not be able to wrap her today 06/24/16: pt reports today she hasn't heard from the vascular clinic regarding proceeding with arterial studies. our office will assist with this today. she denies s/s of infection. denies problems with her wraps. 07/07/16; since I last saw this woman she was admitted to hospital from 7/16 through 7/18 with sepsis syndrome felt to be secondary to her ulcer. She was treated with Vanco and Zosyn the hospital discharged on Septra. Blood cultures were negative. Urine culture showed multiple species she is now feeling well using Santyl with border foam to her wound. [facility] 07/14/16; no major improvement here. She has a quarter-sized wound covered with thick adherent slough on the lateral aspect of her right leg with several surrounding satellite lesions in a similar state. She has had 2 recent bouts of cellulitis, one before she came to this clinic and one required a recent hospitalization. I have asked for arterial studies I don't believe these have ever been done. My notes suggest from early in July that she went for reflux studies although I don't know that I have ever seen these either, she did have a DVT rule out but I did not order this, this did not show a DVT but did show a Baker's cyst 07/21/2016 -- the patient had an arterial duplex study done with waveform suggestive of right femoropopliteal disease. There is also small vessel disease bilaterally. Right ABI was in the normal range of 1.0 and a TBI was 0.74. Left ABI is abnormal at 0.69 with a TBI of 0.40. Three-vessel runoff on the right and a two-vessel runoff on the left. She has an appointment to see Dr. Kirke Corin later today. 07/28/2016 -- was seen by Dr. Kirke Corin who reviewed her arterial studies with an ABI of 0.94 on the right and 0.69 on the left with normal right toe brachial index. Duplex showed diffuse atherosclerosis without discrete Agrusa, Zakia J.  ([id number]) stenosis and there was a three-vessel runoff on the right below the knee. Based on these studies and the location of the ulcer he thought it was venous commended she continue with wound care consults. 08/04/16; I note the  patient's reasonably functional arterial supply which is fortunate. The wound bed appears to be smaller. Debrided of surface slough. She is been using Iodoflex 08/11/16: Only a very small open area remains. However it has some depth 08/18/16; small open area is smaller. Still open however. 08/25/16; the area has totally closed down and epithelialized. READMISSION 02/09/17; this is an 83 year old woman who is not a diabetic or smoker. She was in our clinic in the summer into mid-September with a wound on her right medial calf that. This eventually closed down. I thought this was mostly venous. She did have evidence of PAD and she was seen by Dr. Kirke Corin. At that point she had an ABI of 0.94 on the right 0.69 on the left with a normal right toe brachial index. It was felt that she had enough blood flow to close her wound and that happened. She was discharged with stockings although I don't get the sense that she really use them she tells me that roughly a week or 2 she developed swelling in her bilateral lower legs and then noted wounds on her right lateral malleolus and left medial calf. The on this she is not really sure how this happened. She is not having a lot of pain although she is not ambulatory that much only in her home. Other than weight loss she has not noticed any other issues no specific claudication no chest pain. 02/16/17; the patient continues with 2 wounds on the right lateral malleolus and left medial calf. Both of these are painful. She has chronic venous insufficiency but also known PAD and is previously seen Dr. Kirke Corin. We have arranged vascular follow-up with Dr. Kirke Corin to look at the arterial situation 02/25/2017 -- patient was brought in today because  she missed her appointment last week with Dr. Leanord Hawking and is having a lot of pain and drainage from her wounds. The interim she was seen by Dr. Bascom Levels who did a duplex of the right lower extremity which shows a right ABI of 0.95 and left ABI 0.74. There was also a abdominal aortogram with a bilateral extremity angiography done for significant peripheral vascular disease. The right lower extremity showed diffuse SFA and popliteal disease with significant stenosis affecting the TP trunk just before the origin of the peritoneal artery. The left lower extremity showed moderate left SFA stenosis with occluded popliteal artery and tibioperoneal vessels with only visible collateral. The opinion was the patient had no endovascular surgical revascularization options of the left lower extremity and Dr. Bascom Levels would discuss it with Dr. Randie Heinz for a second opinion. Endovascular intervention on the right TP trunk could be considered. 03/03/17; the patient arrives back today having last seen Dr. Meyer Russel. As noted she has severe PAD which is not amenable to either bypass surgery or endovascular interventions on the left. Endovascular interventions in the right TP trunk could be considered. She has large wounds on the right medial leg and a sizable wound on the right lateral leg. Small wound on the left medial leg. She is really in a lot of discomfort. Using Santyl on the right and I believe Prisma on the left. 03/10/17; the patient arrives today again with large wounds on the right lateral lower extremity, right medial lower extremity. And a small punched out wound on the left medial lower extremity. These are in the setting of severe non-revascularizable PAD. She continues to complain of discomfort although I don't think right now this is unmanageable. We have been using Santyl. We are  arranging getting [facility]] 03/16/17- patient is here for follow-up evaluation of her bilateral lower  extremity ulcers. She has an appointment on 4/11 for an arthrectomy per Dr. Kirke Corin at [facility]. She is continuing to [place]. Her son is doing her dressing changes 03/30/17 still using santyl. sincer her last visit she was admitted from 4/11-4/12 for arthrectomy and balloon angioplasty of the right distal popliteal artery with TP trunk and peritoneal arteries. she is on dual antiplatelet [name], [name]. ([id number]) and states her pain is somewhat better on the right 04/13/17 still using santyl to all wounds. Area on the right medial and left lateral look better. Still difficulties with the right lateral wouind. Combination of arterial insufficiency and Venous stasis No revascularization options on the left. Possible interventions on the right (see above) Electronic Signature(s) Signed: 04/14/2017 7:53:49 AM By: Jordan Najjar MD Entered By: [name] on 04/13/2017 15:39:26 Sergent, [name] ([id number]) -------------------------------------------------------------------------------- Physical Exam Details Patient Name: AMARIE, VILES. Date of Service: 04/13/2017 2:00 PM Medical Record Patient Account Number: [id number] Number: Treating RN: Jordan Mealy, RN, BSN, [place] Sink 02-Sep-1931 [phone] y.o. Other Clinician: Huel Hopkins Date of Birth/Sex: Female) Treating Leanord Hawking Hopkins Primary Care Provider: Guy Hopkins Provider/Extender: G Referring Provider: Guy Hopkins Weeks in Treatment: 9 Constitutional Sitting or standing Blood Pressure is within target range for patient.. Pulse regular and within target range for patient.Marland Kitchen Respirations regular, non-labored and within target range.. Temperature is normal and within the target range for the patient.. Patient's appearance is neat and clean. Appears in no acute distress. Well nourished and well developed.. Notes wound exam; areas on the medial right leg and medial left leg better. Still requires aggressive debridement on the right lateral  leg' #3 curette necrotic surface. Tolerates well Electronic Signature(s) Signed: 04/14/2017 7:53:49 AM By: Jordan Najjar MD Entered By: [name] on 04/13/2017 15:42:52 Shone, [name] ([id number]) -------------------------------------------------------------------------------- Physician Orders Details [name] Date of Service: 04/13/2017 2:00 PM Medical Record Patient Account Number: [id number] Number: Treating RN: Jordan Mealy, RN, BSN, [place] Sink 02-Jul-1931 [phone] y.o. Other Clinician: Huel Hopkins Date of Birth/Sex: Female) Treating [name] Primary Care Provider: Guy Hopkins Provider/Extender: G Referring Provider: Gabriel Hopkins in Treatment: 9 Verbal / Phone Orders: No Diagnosis Coding Wound Cleansing Wound #2 Right,Lateral Malleolus o Cleanse wound with mild soap and water Wound #3 Left,Medial Lower Leg o Cleanse wound with mild soap and water Wound #4 Right,Medial Lower Leg o Cleanse wound with mild soap and water Anesthetic Wound #2 Right,Lateral Malleolus o Topical Lidocaine 4% cream applied to wound bed prior to debridement - clinic use only Wound #3 Left,Medial Lower Leg o Topical Lidocaine 4% cream applied to wound bed prior to debridement - clinic use only Wound #4 Right,Medial Lower Leg o Topical Lidocaine 4% cream applied to wound bed prior to debridement - clinic use only Primary Wound Dressing Wound #2 Right,Lateral Malleolus o Santyl Ointment Wound #3 Left,Medial Lower Leg o Santyl Ointment Wound #4 Right,Medial Lower Leg o Santyl Ointment Secondary Dressing Wound #2 Right,Lateral Malleolus o Conform/Kerlix o Non-adherent pad o Other - Ace Wrap lightly to DANIESHA, DRIVER. ([id number]) Wound #3 Left,Medial Lower Leg o Conform/Kerlix o Non-adherent pad o Other - Ace Wrap lightly to Wound #4 Right,Medial Lower Leg o Conform/Kerlix o Non-adherent pad o Other - Ace Wrap lightly  to Dressing Change Frequency Wound #2 Right,Lateral Malleolus o Change dressing every day. - HHRN twice weekly. May teach available family member wound care for  additional days. Wound #3 Left,Medial Lower Leg o Change dressing every day. - HHRN twice weekly. May teach available family member wound care for additional days. Wound #4 Right,Medial Lower Leg o Change dressing every day. - HHRN twice weekly. May teach available family member wound care for additional days. Follow-up Appointments Wound #2 Right,Lateral Malleolus o Return Appointment in 1 week. Wound #3 Left,Medial Lower Leg o Return Appointment in 1 week. Wound #4 Right,Medial Lower Leg o Return Appointment in 1 week. Edema Control Wound #2 Right,Lateral Malleolus o Elevate legs to the level of the heart and pump ankles as often as possible Wound #3 Left,Medial Lower Leg o Elevate legs to the level of the heart and pump ankles as often as possible Wound #4 Right,Medial Lower Leg o Elevate legs to the level of the heart and pump ankles as often as possible Additional Orders / Instructions Wound #2 Right,Lateral Malleolus o Increase protein intake. [name], [name] ([id number]) Wound #3 Left,Medial Lower Leg o Increase protein intake. Wound #4 Right,Medial Lower Leg o Increase protein intake. Home Health Wound #2 Right,Lateral Malleolus o Initiate Home [facility] Nursing - [facility] o Continue Home Health Visits - Twice weekly. Patient comes to wound care center on Tuesdays. o Home Health Nurse may visit PRN to address patientos wound care needs. o FACE TO FACE ENCOUNTER: MEDICARE and MEDICAID PATIENTS: I certify that this patient is under my care and that I had a face-to-face encounter that meets the physician face-to-face encounter requirements with this patient on this date. The encounter with the patient was in whole or in part for the following MEDICAL CONDITION:  (primary reason for Home Healthcare) MEDICAL NECESSITY: I certify, that based on my findings, NURSING services are a medically necessary home health service. HOME BOUND STATUS: I certify that my clinical findings support that this patient is homebound (i.e., Due to illness or injury, pt requires aid of supportive devices such as crutches, cane, wheelchairs, walkers, the use of special transportation or the assistance of another person to leave their place of residence. There is a normal inability to leave the home and doing so requires considerable and taxing effort. Other absences are for medical reasons / religious services and are infrequent or of short duration when for other reasons). o If current dressing causes regression in wound condition, may D/C ordered dressing product/s and apply Normal Saline Moist Dressing daily until next Wound Healing Center / Other MD appointment. Notify Wound Healing Center of regression in wound condition at [phone]. o Please direct any NON-WOUND related issues/requests for orders to patient's Primary Care Physician Wound #3 Left,Medial Lower Leg o Initiate Home Health for [facility] - [facility] o Continue Home Health Visits - Twice weekly. Patient comes to wound care center on Tuesdays. o Home Health Nurse may visit PRN to address patientos wound care needs. o FACE TO FACE ENCOUNTER: MEDICARE and MEDICAID PATIENTS: I certify that this patient is under my care and that I had a face-to-face encounter that meets the physician face-to-face encounter requirements with this patient on this date. The encounter with the patient was in whole or in part for the following MEDICAL CONDITION: (primary reason for Home Healthcare) MEDICAL NECESSITY: I certify, that based on my findings, NURSING services are a medically necessary home health service. HOME BOUND STATUS: I certify that my clinical findings support that this patient is  homebound (i.e., Due to illness or injury, pt requires aid of supportive devices such as  crutches, cane, wheelchairs, walkers, the use of special transportation or the assistance of another person to leave their place of residence. There is a normal inability to leave the home and doing so requires considerable and taxing effort. Other absences are for medical reasons / religious services and are infrequent or of short duration when for other reasons). o If current dressing causes regression in wound condition, may D/C ordered dressing product/s and apply Normal Saline Moist Dressing daily until next Wound Healing Center / Other MD appointment. Notify Wound Healing Center of regression in wound condition at [phone]. [name], [name] ([id number]) o Please direct any NON-WOUND related issues/requests for orders to patient's Primary Care Physician Wound #4 Right,Medial Lower Leg o Initiate [facility] o Continue Home Health Visits - Twice weekly. Patient comes to wound care center on Tuesdays. o Home Health Nurse may visit PRN to address patientos wound care needs. o FACE TO FACE ENCOUNTER: MEDICARE and MEDICAID PATIENTS: I certify that this patient is under my care and that I had a face-to-face encounter that meets the physician face-to-face encounter requirements with this patient on this date. The encounter with the patient was in whole or in part for the following MEDICAL CONDITION: (primary reason for Home Healthcare) MEDICAL NECESSITY: I certify, that based on my findings, NURSING services are a medically necessary home health service. HOME BOUND STATUS: I certify that my clinical findings support that this patient is homebound (i.e., Due to illness or injury, pt requires aid of supportive devices such as crutches, cane, wheelchairs, walkers, the use of special transportation or the assistance of another person to leave their place  of residence. There is a normal inability to leave the home and doing so requires considerable and taxing effort. Other absences are for medical reasons / religious services and are infrequent or of short duration when for other reasons). o If current dressing causes regression in wound condition, may D/C ordered dressing product/s and apply Normal Saline Moist Dressing daily until next Wound Healing Center / Other MD appointment. Notify Wound Healing Center of regression in wound condition at [phone]. o Please direct any NON-WOUND related issues/requests for orders to patient's Primary Care Physician Patient Medications Allergies: sulfur Notifications Medication Indication Start End Santyl wounds on legs 04/13/2017 DOSE topical 250 unit/gram ointment - ointment topical Electronic Signature(s) Signed: 04/13/2017 4:25:14 PM By: [name] BSN, RN Signed: 04/14/2017 7:53:49 AM By: Jordan Najjar MD Entered By: [name] on 04/13/2017 15:33:43 [name], [name] ([id number]) -------------------------------------------------------------------------------- Prescription 04/13/2017 [name] Provider: Maxwell Caul MD Date of Birth: 1931/11/12 NPI#: [id number] Sex: F DEA#: [id number] Phone #: [phone] License #: [id number] Patient Address: [facility] [address] [place], [place] [address] [facility] [address], [address] [place], [place] [address] [phone] Allergies sulfur Medication Medication: Route: Strength: Form: Santyl topical 250 unit/gram ointment Class: TOPICAL/MUCOUS MEMBR./SUBCUT. ENZYMES Dose: Frequency / Time: Indication: ointment topical wounds on legs Number of Refills: Number of Units: 0 Generic Substitution: Start Date: End Date: Administered at Substitution Permitted 04/13/2017 Facility: No Note to Pharmacy: [facility]): Date(s): Electronic  Signature(s) KYLIE, SIMMONDS ([id number]) Signed: 04/13/2017 4:25:14 PM By: [name] BSN, RN Signed: 04/14/2017 7:53:49 AM By: Jordan Najjar MD Entered By: [name] on 04/13/2017 15:33:44 Dupriest, [name] ([id number]) --------------------------------------------------------------------------------  Problem List Details Patient Name: SHELSIE, TIJERINO. Date of Service: 04/13/2017 2:00 PM Medical Record Patient Account Number: [id number]  Number: Treating RN: Jordan Mealy, RN, BSN, [place] March 12, 1931 [phone] y.o. 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/09/2017 Yes muscle L97.221 Non-pressure chronic ulcer of left calf limited to 02/09/2017 Yes breakdown of skin I70.233 Atherosclerosis of native arteries of right leg with 02/25/2017 Yes ulceration of ankle I70.243 Atherosclerosis of native arteries of left leg with ulceration 02/25/2017 Yes of ankle Inactive Problems Resolved Problems Electronic Signature(s) Signed: 04/14/2017 7:53:49 AM By: Jordan Najjar MD Entered By: [name] on 04/13/2017 15:35:12 Ciulla, [name] ([id number]) -------------------------------------------------------------------------------- Progress Note Details [name] Date of Service: 04/13/2017 2:00 PM Medical Record Patient Account Number: [id number] Number: Treating RN: Jordan Mealy, RN, BSN, [place] Sink 03-Mar-1931 [id number] y.o. Other Clinician: Huel Hopkins Date of Birth/Sex: Female) Treating Leanord Hawking Hopkins Primary Care Provider: Guy Hopkins Provider/Extender: G Referring  Provider: Gabriel Hopkins in Treatment: 9 Subjective Chief Complaint Information obtained from Patient the patient is here for follow-up evaluation of her bilateral lower extremity ulcers History of Present Illness (HPI) 06/10/16; this is an 83 year old woman who lives near [place] with family members. She saw her primary doctor roughly over a week ago and was referred to the ER for a cellulitis in the right medial leg. She was given a round of IV antibiotics in the emergency room and discharged on oral Keflex which she is taking. She is been left with a uncomfortable leg area on the right medial lower leg. Her ABIs in this clinic were 0.8 on the right 0.6 on the left. She does not describe claudication. She is not a diabetic. Lab work in the ER showed a normal BUN and creatinine on 6/20 at 18 and 0.92 respectively white count was 10.1 hemoglobin at 13.8. She did not have any imaging studies. I can see no vascular evaluations in Epic. She does not have a prior wound history 06/17/16; the area affected on her right medial ankle is an area I thought was due to tissue damage from cellulitis last week. She is going for venous reflux studies this afternoon, these were not ordered in this clinic at least not by me. The patient does not give a prior history of damage to the skin in this area although I wonder if she actually might not of noticed it. We have been using's Aquacel Ag under a wrap although we will not be able to wrap her today 06/24/16: pt reports today she hasn't heard from the vascular clinic regarding proceeding with arterial studies. our office will assist with this today. she denies s/s of infection. denies problems with her wraps. 07/07/16; since I last saw this woman she was admitted to hospital from 7/16 through 7/18 with sepsis syndrome felt to be secondary to her ulcer. She was treated with Vanco and Zosyn the hospital discharged on Septra. Blood cultures were negative. Urine  culture showed multiple species she is now feeling well using Santyl with border foam to her wound. [facility] 07/14/16; no major improvement here. She has a quarter-sized wound covered with thick adherent slough on the lateral aspect of her right leg with several surrounding satellite lesions in a similar state. She has had 2 recent bouts of cellulitis, one before  she came to this clinic and one required a recent hospitalization. I have asked for arterial studies I don't believe these have ever been done. My notes suggest from early in July that she went for reflux studies although I don't know that I have ever seen these either, she did have a DVT rule out but I did not order this, this did not show a DVT but did show a Baker's cyst 07/21/2016 -- the patient had an arterial duplex study done with waveform suggestive of right femoropopliteal disease. There is also small vessel disease bilaterally. Right ABI was in the normal range of 1.0 and a TBI Wagler, Zavannah J. ([id number]) was 0.74. Left ABI is abnormal at 0.69 with a TBI of 0.40. Three-vessel runoff on the right and a two-vessel runoff on the left. She has an appointment to see Dr. Kirke Corin later today. 07/28/2016 -- was seen by Dr. Kirke Corin who reviewed her arterial studies with an ABI of 0.94 on the right and 0.69 on the left with normal right toe brachial index. Duplex showed diffuse atherosclerosis without discrete stenosis and there was a three-vessel runoff on the right below the knee. Based on these studies and the location of the ulcer he thought it was venous commended she continue with wound care consults. 08/04/16; I note the patient's reasonably functional arterial supply which is fortunate. The wound bed appears to be smaller. Debrided of surface slough. She is been using Iodoflex 08/11/16: Only a very small open area remains. However it has some depth 08/18/16; small open area is smaller. Still open however. 08/25/16; the  area has totally closed down and epithelialized. READMISSION 02/09/17; this is an 83 year old woman who is not a diabetic or smoker. She was in our clinic in the summer into mid-September with a wound on her right medial calf that. This eventually closed down. I thought this was mostly venous. She did have evidence of PAD and she was seen by Dr. Kirke Corin. At that point she had an ABI of 0.94 on the right 0.69 on the left with a normal right toe brachial index. It was felt that she had enough blood flow to close her wound and that happened. She was discharged with stockings although I don't get the sense that she really use them she tells me that roughly a week or 2 she developed swelling in her bilateral lower legs and then noted wounds on her right lateral malleolus and left medial calf. The on this she is not really sure how this happened. She is not having a lot of pain although she is not ambulatory that much only in her home. Other than weight loss she has not noticed any other issues no specific claudication no chest pain. 02/16/17; the patient continues with 2 wounds on the right lateral malleolus and left medial calf. Both of these are painful. She has chronic venous insufficiency but also known PAD and is previously seen Dr. Kirke Corin. We have arranged vascular follow-up with Dr. Kirke Corin to look at the arterial situation 02/25/2017 -- patient was brought in today because she missed her appointment last week with Dr. Leanord Hawking and is having a lot of pain and drainage from her wounds. The interim she was seen by Dr. Bascom Levels who did a duplex of the right lower extremity which shows a right ABI of 0.95 and left ABI 0.74. There was also a abdominal aortogram with a bilateral extremity angiography done for significant peripheral vascular disease. The right lower extremity showed diffuse SFA  and popliteal disease with significant stenosis affecting the TP trunk just before the origin of the peritoneal artery.  The left lower extremity showed moderate left SFA stenosis with occluded popliteal artery and tibioperoneal vessels with only visible collateral. The opinion was the patient had no endovascular surgical revascularization options of the left lower extremity and Dr. Bascom Levelseeder would discuss it with Dr. Randie Heinzain for a second opinion. Endovascular intervention on the right TP trunk could be considered. 03/03/17; the patient arrives back today having last seen Dr. Meyer RusselBritto. As noted she has severe PAD which is not amenable to either bypass surgery or endovascular interventions on the left. Endovascular interventions in the right TP trunk could be considered. She has large wounds on the right medial leg and a sizable wound on the right lateral leg. Small wound on the left medial leg. She is really in a lot of discomfort. Using Santyl on the right and I believe Prisma on the left. 03/10/17; the patient arrives today again with large wounds on the right lateral lower extremity, right medial lower extremity. And a small punched out wound on the left medial lower extremity. These are in the setting of severe non-revascularizable PAD. She continues to complain of discomfort although I don't think right now this is unmanageable. We have been using Santyl. We are arranging getting her home health [facility] Jordan BandaFULLER, Audryna J. ([id number]) 03/16/17- patient is here for follow-up evaluation of her bilateral lower extremity ulcers. She has an appointment on 4/11 for an arthrectomy per [facility]. She is continuing to [place]antyl. Her son is doing her dressing changes 03/30/17 still using santyl. sincer her last visit she was admitted from 4/11-4/12 for arthrectomy and balloon angioplasty of the right distal popliteal artery with TP trunk and peritoneal arteries. she is on dual antiplatelet and states her pain is somewhat better on the right 04/13/17 still using santyl to all wounds. Area on the right medial and  left lateral look better. Still difficulties with the right lateral wouind. Combination of arterial insufficiency and Venous stasis No revascularization options on the left. Possible interventions on the right (see above) Objective Constitutional Sitting or standing Blood Pressure is within target range for patient.. Pulse regular and within target range for patient.Marland Kitchen. Respirations regular, non-labored and within target range.. Temperature is normal and within the target range for the patient.. Patient's appearance is neat and clean. Appears in no acute distress. Well nourished and well developed.. Vitals Time Taken: 2:26 PM, Height: 66 in, Weight: 159 lbs, BMI: 25.7, Temperature: 97.4 F, Pulse: 80 bpm, Respiratory Rate: 16 breaths/min, Blood Pressure: 120/71 mmHg. General Notes: wound exam; areas on the medial right leg and medial left leg better. Still requires aggressive debridement on the right lateral leg' #3 curette necrotic surface. Tolerates well Integumentary (Hair, Skin) Wound #2 status is Open. Original cause of wound was Gradually Appeared. The wound is located on the Right,Lateral Malleolus. The wound measures 2.7cm length x 1.6cm width x 0.2cm depth; 3.393cm^2 area and 0.679cm^3 volume. Wound #3 status is Open. Original cause of wound was Gradually Appeared. The wound is located on the Left,Medial Lower Leg. The wound measures 2.2cm length x 1.6cm width x 0.1cm depth; 2.765cm^2 area and 0.276cm^3 volume. Wound #4 status is Open. Original cause of wound was Gradually Appeared. The wound is located on the Right,Medial Lower Leg. The wound measures 6cm length x 5cm width x 0.1cm depth; 23.562cm^2 area and 2.356cm^3 volume. Assessment Jordan BandaFULLER, Koby J. 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 There was a Skin/Subcutaneous Tissue Debridement ([id number]) debridement with total area of 4.32 sq cm performed by Jordan Caul, MD. with the following instrument(s): Curette to remove Non-Viable tissue/material including Fibrin/Slough and Subcutaneous after achieving pain control using Lidocaine 4% Topical Solution. A time out was conducted at 14:59, prior to the start of the procedure. A Minimum amount of bleeding was controlled with Pressure. The procedure was tolerated well with a pain level of 3 throughout and a pain level of 3 following the procedure. Post Debridement Measurements: 2.7cm length x 1.6cm width x 0.2cm depth; 0.679cm^3 volume. Character of Wound/Ulcer Post Debridement requires further debridement. Severity of Tissue Post Debridement is: Fat layer exposed. Post procedure Diagnosis Wound #2: Same as Pre-Procedure Wound #3 Wound #3 is a Venous Leg Ulcer located on the Left,Medial Lower Leg . There was a Skin/Subcutaneous Tissue Debridement ([id number]) debridement with total area of 3.52 sq cm performed by Jordan Caul, MD. with the following instrument(s): Curette to remove Non-Viable tissue/material including Fibrin/Slough and Subcutaneous after achieving pain control using Lidocaine 4% Topical Solution. A time out was conducted at 15:02, prior to the start of the procedure. A Minimum amount of bleeding was controlled with Pressure. The procedure was tolerated well with a pain level of 3 throughout and a pain level of 3 following the procedure. Post Debridement Measurements: 2.2cm length x 1.6cm width x 0.1cm depth; 0.276cm^3  volume. Character of Wound/Ulcer Post Debridement requires further debridement. Severity of Tissue Post Debridement is: Fat layer exposed. Post procedure Diagnosis Wound #3: Same as Pre-Procedure Plan [name], [name] ([id number]) Wound Cleansing: Wound #2 Right,Lateral Malleolus: Cleanse wound with mild soap and water Wound #3 Left,Medial Lower Leg: Cleanse wound with mild soap and water Wound #4 Right,Medial Lower Leg: Cleanse wound with mild soap and water Anesthetic: Wound #2 Right,Lateral Malleolus: Topical Lidocaine 4% cream applied to wound bed prior to debridement - clinic use only Wound #3 Left,Medial Lower Leg: Topical Lidocaine 4% cream applied to wound bed prior to debridement - clinic use only Wound #4 Right,Medial Lower Leg: Topical Lidocaine 4% cream applied to wound bed prior to debridement - clinic use only Primary Wound Dressing: Wound #2 Right,Lateral Malleolus: Santyl Ointment Wound #3 Left,Medial Lower Leg: Santyl Ointment Wound #4 Right,Medial Lower Leg: Santyl Ointment Secondary Dressing: Wound #2 Right,Lateral Malleolus: Conform/Kerlix Non-adherent pad Other - Ace Wrap lightly to Wound #3 Left,Medial Lower Leg: Conform/Kerlix Non-adherent pad Other - Ace Wrap lightly to Wound #4 Right,Medial Lower Leg: Conform/Kerlix Non-adherent pad Other - Ace Wrap lightly to Dressing Change Frequency: Wound #2 Right,Lateral Malleolus: Change dressing every day. - HHRN twice weekly. May teach available family member wound care for additional days. Wound #3 Left,Medial Lower Leg: Change dressing every day. - HHRN twice weekly. May teach available family member wound care for additional days. Wound #4 Right,Medial Lower Leg: Change dressing every day. - HHRN twice weekly. May teach available family member wound care for additional days. Follow-up Appointments: Wound #2 Right,Lateral Malleolus: Return Appointment in 1 week. Wound #  3 Left,Medial Lower  Leg: Return Appointment in 1 week. Wound #4 Right,Medial Lower Leg: [name], [name]. ([id number]) Return Appointment in 1 week. Edema Control: Wound #2 Right,Lateral Malleolus: Elevate legs to the level of the heart and pump ankles as often as possible Wound #3 Left,Medial Lower Leg: Elevate legs to the level of the heart and pump ankles as often as possible Wound #4 Right,Medial Lower Leg: Elevate legs to the level of the heart and pump ankles as often as possible Additional Orders / Instructions: Wound #2 Right,Lateral Malleolus: Increase protein intake. Wound #3 Left,Medial Lower Leg: Increase protein intake. Wound #4 Right,Medial Lower Leg: Increase protein intake. Home Health: Wound #2 Right,Lateral Malleolus: Initiate [facility]illed Nursing - [facility] Continue Home Health Visits - Twice weekly. Patient comes to wound care center on Tuesdays. Home Health Nurse may visit PRN to address patient s wound care needs. FACE TO FACE ENCOUNTER: MEDICARE and MEDICAID PATIENTS: I certify that this patient is under my care and that I had a face-to-face encounter that meets the physician face-to-face encounter requirements with this patient on this date. The encounter with the patient was in whole or in part for the following MEDICAL CONDITION: (primary reason for Home Healthcare) MEDICAL NECESSITY: I certify, that based on my findings, NURSING services are a medically necessary home health service. HOME BOUND STATUS: I certify that my clinical findings support that this patient is homebound (i.e., Due to illness or injury, pt requires aid of supportive devices such as crutches, cane, wheelchairs, walkers, the use of special transportation or the assistance of another person to leave their place of residence. There is a normal inability to leave the home and doing so requires considerable and taxing effort. Other absences are for medical reasons / religious services and  are infrequent or of short duration when for other reasons). If current dressing causes regression in wound condition, may D/C ordered dressing product/s and apply Normal Saline Moist Dressing daily until next Wound Healing Center / Other MD appointment. Notify Wound Healing Center of regression in wound condition at [phone]. Please direct any NON-WOUND related issues/requests for orders to patient's Primary Care Physician Wound #3 Left,Medial Lower Leg: Initiate Home [facility]illed [facility] - [facility] Continue Home Health Visits - Twice weekly. Patient comes to wound care center on Tuesdays. Home Health Nurse may visit PRN to address patient s wound care needs. FACE TO FACE ENCOUNTER: MEDICARE and MEDICAID PATIENTS: I certify that this patient is under my care and that I had a face-to-face encounter that meets the physician face-to-face encounter requirements with this patient on this date. The encounter with the patient was in whole or in part for the following MEDICAL CONDITION: (primary reason for Home Healthcare) MEDICAL NECESSITY: I certify, that based on my findings, NURSING services are a medically necessary home health service. HOME BOUND STATUS: I certify that my clinical findings support that this patient is homebound (i.e., Due to illness or injury, pt requires aid of supportive devices such as crutches, cane, wheelchairs, walkers, the use of special transportation or the assistance of another person to leave their place of residence. There is a normal inability to leave the home and doing so requires considerable and taxing effort. Other absences are for medical reasons / religious services and are infrequent or of short duration when for other reasons). If current dressing causes regression in wound condition, may D/C ordered dressing product/s and apply Normal Saline Moist Dressing daily until  next Wound Healing Center / Other MD appointment. Notify  Wound Healing Center of regression in wound condition at [phone]. [name], [name] ([id number]) Please direct any NON-WOUND related issues/requests for orders to patient's Primary Care Physician Wound #4 Right,Medial Lower Leg: Initiate Home [facility] for Skilled Nursing - [facility] Continue Home Health Visits - Twice weekly. Patient comes to wound care center on Tuesdays. Home Health Nurse may visit PRN to address patient s wound care needs. FACE TO FACE ENCOUNTER: MEDICARE and MEDICAID PATIENTS: I certify that this patient is under my care and that I had a face-to-face encounter that meets the physician face-to-face encounter requirements with this patient on this date. The encounter with the patient was in whole or in part for the following MEDICAL CONDITION: (primary reason for Home Healthcare) MEDICAL NECESSITY: I certify, that based on my findings, NURSING services are a medically necessary home health service. HOME BOUND STATUS: I certify that my clinical findings support that this patient is homebound (i.e., Due to illness or injury, pt requires aid of supportive devices such as crutches, cane, wheelchairs, walkers, the use of special transportation or the assistance of another person to leave their place of residence. There is a normal inability to leave the home and doing so requires considerable and taxing effort. Other absences are for medical reasons / religious services and are infrequent or of short duration when for other reasons). If current dressing causes regression in wound condition, may D/C ordered dressing product/s and apply Normal Saline Moist Dressing daily until next Wound Healing Center / Other MD appointment. Notify Wound Healing Center of regression in wound condition at [phone]. Please direct any NON-WOUND related issues/requests for orders to patient's Primary Care Physician The following medication(s) was prescribed: Santyl topical 250 unit/gram  ointment ointment topical for wounds on legs starting 04/13/2017 #1 area on the right lateral still looks ischemic. Difficult debridement. continue santyl #2 areas on the right medial and left medial look better. 3 there is apparently still a possibility of a vascualr intervention on the right, may need to discuss with Dr. Kirke Corin Electronic Signature(s) Signed: 04/14/2017 7:53:49 AM By: Jordan Najjar MD Entered By: [name] on 04/13/2017 15:44:52 Mchaffie, [name] ([id number]) -------------------------------------------------------------------------------- SuperBill Details [name] Date of Service: 04/13/2017 Medical Record Patient Account Number: [id number] Number: Treating RN: Jordan Mealy, RN, BSN, [place] Sink August 03, 1931 [phone] y.o. 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 L97.221 Non-pressure chronic ulcer of left calf limited to breakdown of skin I70.233 Atherosclerosis of native arteries of right leg with ulceration of ankle I70.243 Atherosclerosis of native arteries of left leg with ulceration of ankle Facility Procedures CPT4 Code Description: [id number] 11042 - DEB SUBQ TISSUE 20 SQ CM/< ICD-10 Description Diagnosis L97.313 Non-pressure chronic ulcer of right ankle with necrosi I70.243 Atherosclerosis of native arteries of left leg with ul Modifier: s of muscle ceration of ank Quantity: 1 le Physician Procedures CPT4 Code Description: [id number] 11042 - WC PHYS SUBQ TISS 20 SQ CM ICD-10 Description Diagnosis L97.313 Non-pressure chronic ulcer of right ankle with necrosi I70.243  Atherosclerosis of native arteries of left leg with ul Modifier: s of muscle ceration of ankl Quantity: 1 e Electronic Signature(s) Signed: 04/14/2017  7:53:49 AM By: Jordan Najjar MD Entered By: [name] on 04/13/2017 15:45:15

## 2017-04-20 ENCOUNTER — Encounter: Payer: Medicare Other | Admitting: Internal Medicine

## 2017-04-20 ENCOUNTER — Encounter: Payer: Self-pay | Admitting: Cardiovascular Disease

## 2017-04-20 ENCOUNTER — Ambulatory Visit (INDEPENDENT_AMBULATORY_CARE_PROVIDER_SITE_OTHER): Payer: Medicare Other | Admitting: Cardiovascular Disease

## 2017-04-20 VITALS — BP 98/78 | HR 68 | Ht 65.0 in | Wt 143.0 lb

## 2017-04-20 DIAGNOSIS — I10 Essential (primary) hypertension: Secondary | ICD-10-CM | POA: Diagnosis not present

## 2017-04-20 DIAGNOSIS — I87333 Chronic venous hypertension (idiopathic) with ulcer and inflammation of bilateral lower extremity: Secondary | ICD-10-CM | POA: Diagnosis not present

## 2017-04-20 DIAGNOSIS — I739 Peripheral vascular disease, unspecified: Secondary | ICD-10-CM | POA: Diagnosis not present

## 2017-04-20 MED ORDER — AMLODIPINE BESYLATE 5 MG PO TABS: 5.0000 mg | ORAL_TABLET | Freq: Every day | ORAL | 3 refills | Status: DC

## 2017-04-20 MED ORDER — TRAMADOL HCL 50 MG PO TABS: 50.0000 mg | ORAL_TABLET | Freq: Two times a day (BID) | ORAL | 0 refills | Status: DC | PRN

## 2017-04-20 NOTE — Progress Notes (Addendum)
[name], Jordan J. ([id number]) Visit Report for 04/20/2017 Arrival Information Details [name], Jordan J. Date of Service: 04/20/2017 10:15 AM Medical Record Patient Account Number: [id number] Number: Treating RN: [name] [birth year] [phone](83 y.o. Other Clinician: Date of Birth/Sex: Female) Treating ROBSON, Hopkins Primary Care [name]: Jordan BeginFARAHI, Hopkins [name] Referring Raziya Aveni: Gabriel RungFARAHI, Hopkins Weeks in Treatment: 10 Visit Information History Since Last Visit All ordered tests and consults were completed: No Patient Arrived: Wheel Chair Added or deleted any medications: No Arrival Time: 09:53 Any new allergies or adverse reactions: No Accompanied By: hubby Had a fall or experienced change in No Transfer Assistance: Manual activities of daily living that may affect Patient Identification Verified: Yes risk of falls: Secondary Verification Process Yes Signs or symptoms of abuse/neglect since last No Completed: visito Patient Requires Transmission- No Hospitalized since last visit: No Based Precautions: Has Dressing in Place as Prescribed: Yes Patient Has Alerts: Yes Pain Present Now: Yes Patient Alerts: Patient on Blood Thinner Electronic Signature(s) Signed: 04/20/2017 9:53:37 AM By: Jordan EricAfful, Hopkins BSN, RN Entered By: Jordan EricAfful, Hopkins on 04/20/2017 09:53:37 Kitzmiller, [name] ([id number]) -------------------------------------------------------------------------------- Clinic Level of Care Assessment Details [name], Jordan J. Date of Service: 04/20/2017 10:15 AM Medical Record Patient Account Number: [id number] Number: Treating RN: [name] [birth year] [phone](83 y.o. Other Clinician: Date of Birth/Sex: Female) Treating ROBSON, Hopkins Primary Care Keona Bilyeu: Jordan BeginFARAHI, Hopkins [name]/Extender: G Referring [employer]: Gabriel RungFARAHI, Hopkins Weeks in Treatment: 10 Clinic Level of Care Assessment Items TOOL 4 Quantity Score []  -  Use when only an EandM is performed on FOLLOW-UP visit 0 ASSESSMENTS - Nursing Assessment / Reassessment X - Reassessment of Co-morbidities (includes updates in patient status) 1 10 X - Reassessment of Adherence to Treatment Plan 1 5 ASSESSMENTS - Wound and Skin Assessment / Reassessment []  - Simple Wound Assessment / Reassessment - one wound 0 X - Complex Wound Assessment / Reassessment - multiple wounds 3 5 []  - Dermatologic / Skin Assessment (not related to wound area) 0 ASSESSMENTS - Focused Assessment []  - Circumferential Edema Measurements - multi extremities 0 []  - Nutritional Assessment / Counseling / Intervention 0 X - Lower Extremity Assessment (monofilament, tuning fork, pulses) 1 5 []  - Peripheral Arterial Disease Assessment (using hand held doppler) 0 ASSESSMENTS - Ostomy and/or Continence Assessment and Care []  - Incontinence Assessment and Management 0 []  - Ostomy Care Assessment and Management (repouching, etc.) 0 PROCESS - Coordination of Care X - Simple Patient / Family Education for ongoing care 1 15 []  - Complex (extensive) Patient / Family Education for ongoing care 0 X - Staff obtains ChiropractorConsents, Records, Test Results / Process Orders 1 10 X - Staff telephones HHA, Nursing Homes / Clarify orders / etc 1 10 [name], [name] ([id number]) []  - Routine Transfer to another Facility (non-emergent condition) 0 []  - Routine Hospital Admission (non-emergent condition) 0 []  - New Admissions / Manufacturing engineernsurance Authorizations / Ordering NPWT, Apligraf, etc. 0 []  - Emergency Hospital Admission (emergent condition) 0 []  - Simple Discharge Coordination 0 []  - Complex (extensive) Discharge Coordination 0 PROCESS - Special Needs []  - Pediatric / Minor Patient Management 0 []  - Isolation Patient Management 0 []  - Hearing / Language / Visual special needs 0 []  - Assessment of Community assistance (transportation, D/C planning, etc.) 0 []  - Additional assistance / Altered mentation 0 []  -  Support Surface(s) Assessment (bed, cushion, seat, etc.) 0 INTERVENTIONS - Wound Cleansing / Measurement []  - Simple Wound Cleansing - one wound  0 X - Complex Wound Cleansing - multiple wounds 3 5 X - Wound Imaging (photographs - any number of wounds) 1 5 []  - Wound Tracing (instead of photographs) 0 []  - Simple Wound Measurement - one wound 0 X - Complex Wound Measurement - multiple wounds 3 5 INTERVENTIONS - Wound Dressings []  - Small Wound Dressing one or multiple wounds 0 X - Medium Wound Dressing one or multiple wounds 3 15 []  - Large Wound Dressing one or multiple wounds 0 []  - Application of Medications - topical 0 []  - Application of Medications - injection 0 [name], [name] ([id number]) INTERVENTIONS - Miscellaneous []  - External ear exam 0 []  - Specimen Collection (cultures, biopsies, blood, body fluids, etc.) 0 []  - Specimen(s) / Culture(s) sent or taken to Lab for analysis 0 []  - Patient Transfer (multiple staff / Michiel Sites Lift / Similar devices) 0 []  - Simple Staple / Suture removal (25 or less) 0 []  - Complex Staple / Suture removal (26 or more) 0 []  - Hypo / Hyperglycemic Management (close monitor of Blood Glucose) 0 []  - Ankle / Brachial Index (ABI) - do not check if billed separately 0 X - Vital Signs 1 5 Has the patient been seen at the hospital within the last three years: Yes Total Score: 155 Level Of Care: New/Established - Level 4 Electronic Signature(s) Signed: 04/20/2017 5:27:57 PM By: [name] BSN, RN Entered By: [name] on 04/20/2017 10:22:16 [name] ([id number]) -------------------------------------------------------------------------------- Encounter Discharge Information Details [name] Date of Service: 04/20/2017 10:15 AM Medical Record Patient Account Number: [id number] Number: Treating RN: Jordan Mealy, RN, BSN, Hopkins [birth year] [phone] y.o. Other Clinician: Date of Birth/Sex: Female) Treating ROBSON,  Hopkins Primary Care Jamiyla Ishee: [name] Marciano Mundt/Extender: G Referring Helaina Stefano: [name] in Treatment: 10 Encounter Discharge Information Items Discharge Pain Level: 0 Discharge Condition: Stable Ambulatory Status: Wheelchair Discharge Destination: Home Transportation: Private Auto Accompanied By: son Schedule Follow-up Appointment: No Medication Reconciliation completed No and provided to Patient/Care Philomina Leon: Provided on Clinical Summary of Care: 04/20/2017 Form Type Recipient Paper Patient CF Electronic Signature(s) Signed: 04/20/2017 10:36:35 AM By: Gwenlyn Perking Entered By: Gwenlyn Perking on 04/20/2017 10:36:34 Kirn, [name] ([id number]) -------------------------------------------------------------------------------- Lower Extremity Assessment Details [name] Date of Service: 04/20/2017 10:15 AM Medical Record Patient Account Number: [id number] Number: Treating RN: Jordan Mealy, RN, BSN, Hopkins [birth year] [phone] y.o. Other Clinician: Date of Birth/Sex: Female) Treating ROBSON, Hopkins Primary Care Bedelia Pong: [name] Serayah Yazdani/Extender: G Referring Sarim Rothman: [name] Weeks in Treatment: 10 Edema Assessment Assessed: [Left: No] [Right: No] Edema: [Left: No] [Right: Yes] Vascular Assessment Pulses: Dorsalis Pedis Palpable: [Left:No] [Right:Yes] Doppler Audible: [Left:Yes] Posterior Tibial Extremity colors, hair growth, and conditions: Extremity Color: [Left:Hyperpigmented] [Right:Hyperpigmented] Hair Growth on Extremity: [Left:No] [Right:No] Temperature of Extremity: [Left:Warm] [Right:Warm] Capillary Refill: [Left:< 3 seconds] [Right:< 3 seconds] Toe Nail Assessment Left: Right: Thick: Yes Yes Discolored: Yes Yes Deformed: Yes Yes Improper Length and Hygiene: Yes Yes Electronic Signature(s) Signed: 04/20/2017 5:27:57 PM By: [name] BSN, RN Entered By: [name] on 04/20/2017 10:13:26 Ure, [name]  ([id number]) -------------------------------------------------------------------------------- Multi Wound Chart Details [name] Date of Service: 04/20/2017 10:15 AM Medical Record Patient Account Number: [id number] Number: Treating RN: Jordan Mealy, RN, BSN, Hopkins [birth year] [phone] y.o. Other Clinician: Date of Birth/Sex: Female) Treating ROBSON, Hopkins Primary Care Mory Herrman: [name] Nyara Capell/Extender: G Referring Stone Spirito: [name] Weeks in Treatment: 10 Vital Signs Height(in): 66 Pulse(bpm): 90 Weight(lbs): 159 Blood Pressure  127/64 (mmHg): Body Mass Index(BMI): 26 Temperature(F): 98.1 Respiratory Rate 16 (breaths/min): Photos: [2:No Photos] [3:No Photos] [4:No Photos] Wound Location: [2:Right Malleolus - Lateral] [3:Left Lower Leg - Medial] [4:Right Lower Leg - Medial] Wounding Event: [2:Gradually Appeared] [3:Gradually Appeared] [4:Gradually Appeared] Primary Etiology: [2:Venous Leg Ulcer] [3:Venous Leg Ulcer] [4:Venous Leg Ulcer] Comorbid History: [2:Cataracts, Arrhythmia, Hypertension, Osteoarthritis] [3:Cataracts, Arrhythmia, Hypertension, Osteoarthritis] [4:Cataracts, Arrhythmia, Hypertension, Osteoarthritis] Date Acquired: [2:01/19/2017] [3:01/19/2017] [4:02/16/2017] Weeks of Treatment: [2:10] [3:10] [4:9] Wound Status: [2:Open] [3:Open] [4:Open] Measurements L x W x D 3.4x2x0.2 [3:2.8x2.9x0.1] [4:6x4.5x0.1] (cm) Area (cm) : [2:5.341] [3:6.377] [4:21.206] Volume (cm) : [2:1.068] [3:0.638] [4:2.121] % Reduction in Area: [2:-29.00%] [3:-1522.60%] [4:-13407.00%] % Reduction in Volume: -158.00% [3:-1535.90%] [4:-13156.20%] Classification: [2:Full Thickness Without Exposed Support Structures] [3:Full Thickness Without Exposed Support Structures] [4:Full Thickness Without Exposed Support Structures] Exudate Amount: [2:Medium] [3:Medium] [4:Small] Exudate Type: [2:Serosanguineous] [3:Serosanguineous] [4:Serosanguineous] Exudate Color:  [2:red, brown] [3:red, brown] [4:red, brown] Wound Margin: [2:Flat and Intact] [3:Flat and Intact] [4:Flat and Intact] Granulation Amount: [2:Medium (34-66%)] [3:Small (1-33%)] [4:Medium (34-66%)] Granulation Quality: [2:Pink] [3:Pink] [4:Pink, Pale] Necrotic Amount: [2:Medium (34-66%)] [3:Large (67-100%)] [4:Medium (34-66%)] Exposed Structures: [2:Fat Layer (Subcutaneous Tissue) Exposed: Yes] [3:Fat Layer (Subcutaneous Tissue) Exposed: Yes] [4:Fat Layer (Subcutaneous Tissue) Exposed: Yes] Fascia: No Fascia: No Fascia: No Tendon: No Tendon: No Tendon: No Muscle: No Muscle: No Muscle: No Joint: No Joint: No Joint: No Bone: No Bone: No Bone: No Epithelialization: N/A Small (1-33%) Medium (34-66%) Periwound Skin Texture: Induration: Yes Excoriation: No No Abnormalities Noted Excoriation: No Induration: No Callus: No Callus: No Crepitus: No Crepitus: No Rash: No Rash: No Scarring: No Scarring: No Periwound Skin Maceration: No Maceration: No No Abnormalities Noted Moisture: Dry/Scaly: No Dry/Scaly: No Periwound Skin Color: Hemosiderin Staining: Yes Hemosiderin Staining: Yes No Abnormalities Noted Atrophie Blanche: No Atrophie Blanche: No Cyanosis: No Cyanosis: No Ecchymosis: No Ecchymosis: No Erythema: No Erythema: No Mottled: No Mottled: No Pallor: No Pallor: No Rubor: No Rubor: No Temperature: No Abnormality No Abnormality No Abnormality Tenderness on Yes Yes Yes Palpation: Wound Preparation: Ulcer Cleansing: Ulcer Cleansing: Ulcer Cleansing: Rinsed/Irrigated with Rinsed/Irrigated with Rinsed/Irrigated with Saline Saline Saline Topical Anesthetic Topical Anesthetic Topical Anesthetic Applied: Other: lidocaine Applied: Other: lidocaine Applied: Other: lidocaine 4% 4% 4% Treatment Notes Wound #2 (Right, Lateral Malleolus) 1. Cleansed with: Clean wound with Normal Saline 3. Peri-wound Care: Moisturizing lotion 4. Dressing Applied: Santyl  Ointment 5. Secondary Dressing Applied [place] ABD and Kerlix/Conform 7. Secured with Tape Notes ace wrap Glatfelter, Candyce J. ([id number]) Wound #3 (Left, Medial Lower Leg) 1. Cleansed with: Clean wound with Normal Saline 3. Peri-wound Care: Moisturizing lotion 4. Dressing Applied: Santyl Ointment 5. Secondary Dressing Applied [place] ABD and Kerlix/Conform 7. Secured with Tape Notes ace wrap Wound #4 (Right, Medial Lower Leg) 1. Cleansed with: Clean wound with Normal Saline 3. Peri-wound Care: Moisturizing lotion 4. Dressing Applied: Santyl Ointment 5. Secondary Dressing Applied [place] ABD and Kerlix/Conform 7. Secured with Tape Notes ace wrap Electronic Signature(s) Signed: 04/21/2017 12:30:45 PM By: Baltazar Najjar MD Entered By: Baltazar Najjar on 04/20/2017 16:51:47 Maniaci, [name] ([id number]) -------------------------------------------------------------------------------- Multi-Disciplinary Care Plan Details [name], KUIPERS. Date of Service: 04/20/2017 10:15 AM Medical Record Patient Account Number: [id number] Number: Treating RN: Jordan Mealy, RN, BSN, Hopkins 18-Oct-1931 [phone] y.o. Other Clinician: Date of Birth/Sex: Female) Treating ROBSON, Hopkins Primary Care Marbin Olshefski: [name] Donyell Carrell/Extender: G Referring Ad Guttman: [name] in Treatment: 10 Active Inactive ` Abuse / Safety / Falls / Self Care Management Nursing Diagnoses: Potential  for falls Goals: Patient will remain injury free Date Initiated: 02/09/2017 Target Resolution Date: 04/17/2017 Goal Status: Active Interventions: Assess fall risk on admission and as needed Assess self care needs on admission and as needed Notes: ` Nutrition Nursing Diagnoses: Imbalanced nutrition Goals: Patient/caregiver agrees to and verbalizes understanding of need to use nutritional supplements and/or vitamins as prescribed Date Initiated: 02/09/2017 Target  Resolution Date: 04/17/2017 Goal Status: Active Interventions: Assess patient nutrition upon admission and as needed per policy Notes: ` Orientation to the Wound Care Program JASIA, HILTUNEN ([id number]) Nursing Diagnoses: Knowledge deficit related to the wound healing center program Goals: Patient/caregiver will verbalize understanding of the Wound Healing Center Program Date Initiated: 02/09/2017 Target Resolution Date: 02/20/2017 Goal Status: Active Interventions: Provide education on orientation to the wound center Notes: ` Pain, Acute or Chronic Nursing Diagnoses: Pain, acute or chronic: actual or potential Potential alteration in comfort, pain Goals: Patient/caregiver will verbalize adequate pain control between visits Date Initiated: 02/09/2017 Target Resolution Date: 04/17/2017 Goal Status: Active Interventions: Assess comfort goal upon admission Complete pain assessment as per visit requirements Notes: ` Wound/Skin Impairment Nursing Diagnoses: Impaired tissue integrity Knowledge deficit related to smoking impact on wound healing Knowledge deficit related to ulceration/compromised skin integrity Goals: Ulcer/skin breakdown will have a volume reduction of 80% by week 12 Date Initiated: 02/09/2017 Target Resolution Date: 04/10/2017 Goal Status: Active Interventions: Assess patient/caregiver ability to perform ulcer/skin care regimen upon admission and as needed SHELVY, HECKERT ([id number]) Assess ulceration(s) every visit Notes: Electronic Signature(s) Signed: 04/20/2017 5:27:57 PM By: [name] BSN, RN Entered By: [name] on 04/20/2017 10:13:54 Rolin, [name] ([id number]) -------------------------------------------------------------------------------- Pain Assessment Details [name] Date of Service: 04/20/2017 10:15 AM Medical Record Patient Account Number: [id number] Number: Treating RN: Jordan Mealy, RN, BSN, Hopkins 1931-03-12 [phone]  y.o. Other Clinician: Date of Birth/Sex: Female) Treating ROBSON, Hopkins Primary Care Jadeyn Hargett: [name] Tiajuana Leppanen/Extender: G Referring Gregoire Bennis: [name] in Treatment: 10 Active Problems Location of Pain Severity and Description of Pain Patient Has Paino Yes Site Locations Pain Location: Pain in Ulcers Rate the pain. Current Pain Level: 3 Character of Pain Describe the Pain: Tender Pain Management and Medication Current Pain Management: Electronic Signature(s) Signed: 04/20/2017 9:53:50 AM By: [name] BSN, RN Entered By: [name] on 04/20/2017 09:53:50 Din, [name] ([id number]) -------------------------------------------------------------------------------- Patient/Caregiver Education Details [name] Date of Service: 04/20/2017 10:15 AM Medical Record Patient Account Number: [id number] Number: Treating RN: Jordan Mealy, RN, BSN, Hopkins [birth year] [phone] y.o. Other Clinician: Date of Birth/Gender: Female) Treating ROBSON, Hopkins Primary Care Physician: [name] Physician/Extender: G Referring Physician: [name] in Treatment: 10 Education Assessment Education Provided To: Patient Education Topics Provided Welcome To The Wound Care Center: Methods: Explain/Verbal Responses: State content correctly Wound/Skin Impairment: Methods: Explain/Verbal Responses: State content correctly Electronic Signature(s) Signed: 04/20/2017 5:27:57 PM By: [name] BSN, RN Entered By: [name] on 04/20/2017 10:23:51 [name] ([id number]) -------------------------------------------------------------------------------- Wound Assessment Details [name] Date of Service: 04/20/2017 10:15 AM Medical Record Patient Account Number: [id number] Number: Treating RN: Jordan Mealy, RN, BSN, Hopkins March 30, 1931 [phone] y.o. Other Clinician: Date of Birth/Sex: Female) Treating ROBSON, Hopkins Primary Care  Creig Landin: [name] Saphyre Cillo/Extender: G Referring Roel Douthat: [name] Weeks in Treatment: 10 Wound Status Wound Number: 2 Primary Venous Leg Ulcer Etiology: Wound Location: Right Malleolus - Lateral Wound Status: Open Wounding Event: Gradually Appeared Comorbid Cataracts, Arrhythmia, Hypertension, Date Acquired: 01/19/2017 History: Osteoarthritis Weeks Of Treatment:  10 Clustered Wound: No Photos Photo Uploaded By: [name] on 04/20/2017 17:23:59 Wound Measurements Length: (cm) 3.4 Width: (cm) 2 Depth: (cm) 0.2 Area: (cm) 5.341 Volume: (cm) 1.068 % Reduction in Area: -29% % Reduction in Volume: -158% Tunneling: No Undermining: No Wound Description Full Thickness Without Exposed Classification: Support Structures Wound Margin: Flat and Intact Exudate Medium Amount: Exudate Type: Serosanguineous Exudate Color: red, brown Foul Odor After Cleansing: No Slough/Fibrino Yes Wound Bed Granulation Amount: Medium (34-66%) Exposed Structure Rivadeneira, Deasia J. ([id number]) Granulation Quality: Pink Fascia Exposed: No Necrotic Amount: Medium (34-66%) Fat Layer (Subcutaneous Tissue) Exposed: Yes Necrotic Quality: Adherent Slough Tendon Exposed: No Muscle Exposed: No Joint Exposed: No Bone Exposed: No Periwound Skin Texture Texture Color No Abnormalities Noted: No No Abnormalities Noted: No Callus: No Atrophie Blanche: No Crepitus: No Cyanosis: No Excoriation: No Ecchymosis: No Induration: Yes Erythema: No Rash: No Hemosiderin Staining: Yes Scarring: No Mottled: No Pallor: No Moisture Rubor: No No Abnormalities Noted: No Dry / Scaly: No Temperature / Pain Maceration: No Temperature: No Abnormality Tenderness on Palpation: Yes Wound Preparation Ulcer Cleansing: Rinsed/Irrigated with Saline Topical Anesthetic Applied: Other: lidocaine 4%, Treatment Notes Wound #2 (Right, Lateral Malleolus) 1. Cleansed with: Clean wound with Normal  Saline 3. Peri-wound Care: Moisturizing lotion 4. Dressing Applied: Santyl Ointment 5. Secondary Dressing Applied [place] ABD and Kerlix/Conform 7. Secured with Tape Notes ace wrap Electronic Signature(s) Signed: 04/20/2017 5:27:57 PM By: [name] BSN, RN Entered By: [name] on 04/20/2017 10:10:19 ALTHEIA, SHAFRAN ([id number]) LANDREY, MAHURIN ([id number]) -------------------------------------------------------------------------------- Wound Assessment Details [name] Date of Service: 04/20/2017 10:15 AM Medical Record Patient Account Number: [id number] Number: Treating RN: Jordan Mealy, RN, BSN, Hopkins [birth year] [phone] y.o. Other Clinician: Date of Birth/Sex: Female) Treating ROBSON, Hopkins Primary Care Axten Pascucci: [name] Clorine Swing/Extender: G Referring Elyan Vanwieren: [name] Weeks in Treatment: 10 Wound Status Wound Number: 3 Primary Venous Leg Ulcer Etiology: Wound Location: Left Lower Leg - Medial Wound Status: Open Wounding Event: Gradually Appeared Comorbid Cataracts, Arrhythmia, Hypertension, Date Acquired: 01/19/2017 History: Osteoarthritis Weeks Of Treatment: 10 Clustered Wound: No Photos Photo Uploaded By: [name] on 04/20/2017 17:24:32 Wound Measurements Length: (cm) 2.8 Width: (cm) 2.9 Depth: (cm) 0.1 Area: (cm) 6.377 Volume: (cm) 0.638 % Reduction in Area: -1522.6% % Reduction in Volume: -1535.9% Epithelialization: Small (1-33%) Tunneling: No Undermining: No Wound Description Full Thickness Without Exposed Classification: Support Structures Wound Margin: Flat and Intact Demicco, Sumaiyah J. ([id number]) Foul Odor After Cleansing: No Slough/Fibrino Yes Exudate Medium Amount: Exudate Type: Serosanguineous Exudate Color: red, brown Wound Bed Granulation Amount: Small (1-33%) Exposed Structure Granulation Quality: Pink Fascia Exposed: No Necrotic Amount: Large (67-100%) Fat Layer (Subcutaneous Tissue)  Exposed: Yes Necrotic Quality: Adherent Slough Tendon Exposed: No Muscle Exposed: No Joint Exposed: No Bone Exposed: No Periwound Skin Texture Texture Color No Abnormalities Noted: No No Abnormalities Noted: No Callus: No Atrophie Blanche: No Crepitus: No Cyanosis: No Excoriation: No Ecchymosis: No Induration: No Erythema: No Rash: No Hemosiderin Staining: Yes Scarring: No Mottled: No Pallor: No Moisture Rubor: No No Abnormalities Noted: No Dry / Scaly: No Temperature / Pain Maceration: No Temperature: No Abnormality Tenderness on Palpation: Yes Wound Preparation Ulcer Cleansing: Rinsed/Irrigated with Saline Topical Anesthetic Applied: Other: lidocaine 4%, Treatment Notes Wound #3 (Left, Medial Lower Leg) 1. Cleansed with: Clean wound with Normal Saline 3. Peri-wound Care: Moisturizing lotion 4. Dressing Applied: Santyl Ointment 5. Secondary Dressing Applied [place] ABD and Kerlix/Conform 7. Secured with Tape ARIBA, LEHNEN. ([id number]) Notes  ace wrap Electronic Signature(s) Signed: 04/20/2017 5:27:57 PM By: [name] BSN, RN Entered By: [name] on 04/20/2017 10:11:27 [name] ([id number]) -------------------------------------------------------------------------------- Wound Assessment Details [name] Date of Service: 04/20/2017 10:15 AM Medical Record Patient Account Number: [id number] Number: Treating RN: Jordan Mealy, RN, BSN, Hopkins 25-Feb-1931 [phone] y.o. Other Clinician: Date of Birth/Sex: Female) Treating ROBSON, Hopkins Primary Care Meerab Maselli: [name] Lorann Tani/Extender: G Referring Dorsel Flinn: [name] Weeks in Treatment: 10 Wound Status Wound Number: 4 Primary Venous Leg Ulcer Etiology: Wound Location: Right Lower Leg - Medial Wound Status: Open Wounding Event: Gradually Appeared Comorbid Cataracts, Arrhythmia, Hypertension, Date Acquired: 02/16/2017 History: Osteoarthritis Weeks Of  Treatment: 9 Clustered Wound: No Photos Photo Uploaded By: [name] on 04/20/2017 17:24:33 Wound Measurements Length: (cm) 6 Width: (cm) 4.5 Depth: (cm) 0.1 Area: (cm) 21.206 Volume: (cm) 2.121 % Reduction in Area: -13407% % Reduction in Volume: -13156.2% Epithelialization: Medium (34-66%) Tunneling: No Undermining: No Wound Description Full Thickness Without Exposed Classification: Support Structures Wound Margin: Flat and Intact Boodram, Hilaria J. ([id number]) Foul Odor After Cleansing: No Slough/Fibrino Yes Exudate Small Amount: Exudate Type: Serosanguineous Exudate Color: red, brown Wound Bed Granulation Amount: Medium (34-66%) Exposed Structure Granulation Quality: Pink, Pale Fascia Exposed: No Necrotic Amount: Medium (34-66%) Fat Layer (Subcutaneous Tissue) Exposed: Yes Necrotic Quality: Adherent Slough Tendon Exposed: No Muscle Exposed: No Joint Exposed: No Bone Exposed: No Periwound Skin Texture Texture Color No Abnormalities Noted: No No Abnormalities Noted: No Moisture Temperature / Pain No Abnormalities Noted: No Temperature: No Abnormality Tenderness on Palpation: Yes Wound Preparation Ulcer Cleansing: Rinsed/Irrigated with Saline Topical Anesthetic Applied: Other: lidocaine 4%, Treatment Notes Wound #4 (Right, Medial Lower Leg) 1. Cleansed with: Clean wound with Normal Saline 3. Peri-wound Care: Moisturizing lotion 4. Dressing Applied: Santyl Ointment 5. Secondary Dressing Applied [place] ABD and Kerlix/Conform 7. Secured with Tape Notes ace wrap Electronic Signature(s) Signed: 04/20/2017 5:27:57 PM By: [name] BSN, RN Entered By: [name] on 04/20/2017 10:12:27 [name] ([id number]) -------------------------------------------------------------------------------- Vitals Details [name] Date of Service: 04/20/2017 10:15 AM Medical Record Patient Account Number:  [id number] Number: Treating RN: Jordan Mealy, RN, BSN, Hopkins 1931/08/28 [phone] y.o. Other Clinician: Date of Birth/Sex: Female) Treating ROBSON, Hopkins [facility] Shilo Philipson: [name] Fredie Majano/Extender: G Referring Ondre Salvetti: [name] Weeks in Treatment: 10 Vital Signs Time Taken: 10:00 Temperature (F): 98.1 Height (in): 66 Pulse (bpm): 90 Weight (lbs): 159 Respiratory Rate (breaths/min): 16 Body Mass Index (BMI): 25.7 Blood Pressure (mmHg): 127/64 Reference Range: 80 - 120 mg / dl Electronic Signature(s) Signed: 04/20/2017 5:27:57 PM By: [name] BSN, RN Entered By: [name] on 04/20/2017 10:01:07

## 2017-04-20 NOTE — Progress Notes (Signed)
Cardiology Office Note   Date:  04/20/2017   ID:  [name], DOB [birth year], MRN [id number]  PCP:  Guy Begin, MD  Cardiologist:   Lorine Bears, MD   Chief Complaint  Patient presents with  . OTHER    F/u PAD pt has questions about continuing Plavix and atorvastatin. Meds reviewed verbally with pt.      History of Present Illness: [name] is a 83 y.o. female who is here today for a follow-up visit regarding peripheral arterial disease. The patient has no history of diabetes or tobacco use. She is known to have hypertension. She is known to have peripheral arterial disease with significantly abnormal ABI. The patient was seen recently for recurrent ulceration on both legs.  I proceeded with angiography in march which showed: 1. No significant aortoiliac disease. 2. Right lower extremity: Mild diffuse SFA and popliteal disease with significant stenosis affecting the TP trunk just before the origin of the peroneal artery which is the only patent vessel below the knee. 3. Left lower extremity: Moderate left SFA stenosis with occluded popliteal artery and tibial peroneal vessels with only visible collaterals and short segments of reconstitution.  No revascularization options of the left lower extremity. She has no targets for bypass and no options for endovascular intervention given that there is no reconstitution of her tibial peroneal vessels.  Due to worsening wound affecting the right lowerI proceeded last month with successful orbital atherectomy and balloon angioplasty of the right distal popliteal artery, TP trunk and peroneal artery.  No significant improvement in her wounds on both sides. The left side continues to be is a small superficial wound. The pain on the right side is somewhat improved.   Past Medical History:  Diagnosis Date  . Arthritis    "maybe a little" (03/24/2017)  . Dizziness   . Hypertension     Past Surgical History:  Procedure  Laterality Date  . ABDOMINAL AORTIC ANEURYSM REPAIR     hx/notes 07/26/2012  . ABDOMINAL AORTOGRAM N/A 02/24/2017   Procedure: Abdominal Aortogram;  Surgeon: Iran Ouch, MD;  Location: [facility];  Service: Cardiovascular;  Laterality: N/A;  . HERNIA REPAIR    . JOINT REPLACEMENT    . LAPAROSCOPIC INCISIONAL / UMBILICAL / VENTRAL HERNIA REPAIR  2010   VHR w/mesh hx/notes 07/26/2012  . LOWER EXTREMITY ANGIOGRAPHY Bilateral 02/24/2017   Procedure: Lower Extremity Angiography;  Surgeon: Iran Ouch, MD;  Location: [facility];  Service: Cardiovascular;  Laterality: Bilateral;  . PERIPHERAL VASCULAR ATHERECTOMY  03/24/2017  . PERIPHERAL VASCULAR ATHERECTOMY Right 03/24/2017   Procedure: Peripheral Vascular Atherectomy;  Surgeon: Iran Ouch, MD;  Location: [facility];  Service: Cardiovascular;  Laterality: Right;  . TOTAL KNEE ARTHROPLASTY Left      Current Outpatient Prescriptions  Medication Sig Dispense Refill  . acetaminophen (TYLENOL) 500 MG tablet Take 1,000 mg by mouth 2 (two) times daily as needed for moderate pain or headache.    Marland Kitchen aspirin EC 81 MG tablet Take 81 mg by mouth daily.    Marland Kitchen atorvastatin (LIPITOR) 20 MG tablet Take 1 tablet (20 mg total) by mouth daily at 6 PM. 30 tablet 6  . Biotin 1000 MCG tablet Take 1,000 mcg by mouth every evening.    . Calcium Carbonate-Vitamin D (CALCIUM 600+D PO) Take 1 tablet by mouth every evening.    . cholecalciferol (VITAMIN D) 1000 units tablet Take 1,000 Units by mouth every  evening.     . clopidogrel (PLAVIX) 75 MG tablet Take 1 tablet (75 mg total) by mouth daily. 30 tablet 3  . labetalol (NORMODYNE) 100 MG tablet Take 100 mg by mouth 2 (two) times daily.   5  . traMADol (ULTRAM) 50 MG tablet Take 1 tablet (50 mg total) by mouth 2 (two) times daily as needed for moderate pain. 60 tablet 0  . triamterene-hydrochlorothiazide (MAXZIDE-25) 37.5-25 MG per tablet Take 1 tablet by mouth daily.  5  . amLODipine  (NORVASC) 5 MG tablet Take 1 tablet (5 mg total) by mouth daily. 30 tablet 3   No current facility-administered medications for this visit.     Allergies:   Sulfa antibiotics    Social History:  The patient  reports that she has never smoked. She has never used smokeless tobacco. She reports that she does not drink alcohol or use drugs.   Family History:  The patient's Family history is negative for coronary artery disease.   ROS:  Please see the history of present illness.   Otherwise, review of systems are positive for none.   All other systems are reviewed and negative.    PHYSICAL EXAM: VS:  BP 98/78 (BP Location: Right Arm, Patient Position: Sitting, Cuff Size: Normal)   Pulse 68   Ht 5\' 5"  (1.651 m)   Wt 143 lb (64.9 kg)   BMI 23.80 kg/m  , BMI Body mass index is 23.8 kg/m. GEN: Well nourished, well developed, in no acute distress  HEENT: normal  Neck: no JVD, carotid bruits, or masses Cardiac: RRR; no murmurs, rubs, or gallops,no edema  Respiratory:  clear to auscultation bilaterally, normal work of breathing GI: soft, nontender, nondistended, + BS MS: no deformity or atrophy  Skin: warm and dry, no rash Neuro:  Strength and sensation are intact Psych: euthymic mood, full affect The wounds were examined today. there is a 3 x 2 cm wound on the right lateral malleolus. This wound is not as deep as before. There is a superficial wound on the left medial lower leg.  There is also a large superficial on the right medial ankle  EKG:  EKG is not ordered today.    Recent Labs: 06/28/2016: ALT 17 06/30/2016: Magnesium 1.8 03/22/2017: Hemoglobin 13.0; Platelets 167 03/25/2017: BUN 25; Creatinine, Ser 1.07; Potassium 3.2; Sodium 132    Lipid Panel No results found for: CHOL, TRIG, HDL, CHOLHDL, VLDL, LDLCALC, LDLDIRECT    Wt Readings from Last 3 Encounters:  04/20/17 143 lb (64.9 kg)  03/25/17 133 lb 6.1 oz (60.5 kg)  03/11/17 138 lb (62.6 kg)         ASSESSMENT  AND PLAN:  1. Peripheral arterial disease: This is overall difficult situation given that her wounds seem to be due to a combination of severe arterial and venous insufficiency. She has no revascularization options below the knee on the left side.  She underwent recent revascularization in the right lower extremity with atherectomy with good results. The wound on the right side seemed to be only slightly improved. I'm going to repeat her ABI and duplex on the right side. Continue dual antiplatelet therapy. I discussed with her the possibility of amputation. However, she is completely opposed to this idea. Continue wound care for now. If her vascular studies are reasonable on the right side, she might ultimately heal on that side. I provided her with 60 tablets of tramadol for pain control.  2. Essential hypertension: Blood pressure is relatively low.  Given significant leg edema, I decreased amlodipine to 5 mg daily.    Disposition:   FU with me in 1 month.  Signed,  Lorine Bears, MD  04/20/2017 5:00 PM    [facility]

## 2017-04-20 NOTE — Patient Instructions (Signed)
Medication Instructions:  Your physician has recommended you make the following change in your medication:  DECREASE amlodipine to 5mg  once daily   Labwork: none  Testing/Procedures: ABI and lower extremity arterial duplex Your physician has requested that you have a lower extremity arterial exercise duplex. During this test, exercise and ultrasound are used to evaluate arterial blood flow in the legs. Allow one hour for this exam. There are no restrictions or special instructions.  Follow-Up: Your physician recommends that you schedule a follow-up appointment in: 1 month with Dr. Kirke CorinArida.    Any Other Special Instructions Will Be Listed Below (If Applicable).     If you need a refill on your cardiac medications before your next appointment, please call your pharmacy.

## 2017-04-21 ENCOUNTER — Ambulatory Visit: Payer: Medicare Other

## 2017-04-21 DIAGNOSIS — I739 Peripheral vascular disease, unspecified: Secondary | ICD-10-CM

## 2017-04-22 LAB — VAS US LOWER EXTREMITY ARTERIAL DUPLEX
RIGHT POPLITEAL PROX EDV: 25 cm/s
RIGHT SUPER FEMORAL PROX EDV: 27 cm/s
RPOPPPSV: 178 cm/s
RSFDPSV: -158 cm/s
RSFMPSV: -206 cm/s
RTSFADISTDIA: -27 cm/s
RTSFAMIDDIA: -35 cm/s
Right super femoral prox sys PSV: 163 cm/s

## 2017-04-22 NOTE — Progress Notes (Signed)
[name] ([id number]) Visit Report for 04/20/2017 Chief Complaint Document Details [name], [name]. Date of Service: 04/20/2017 10:15 AM Medical Record Patient Account Number: [id number] Number: Treating RN: Jordan Mealy, RN, BSN, [name] 23, 1932 [phone] y.o. Other Clinician: Date of Birth/Sex: Female) Treating [name] Primary Care Provider: Guy Hopkins Provider/Extender: G Referring Provider: Gabriel Hopkins in Treatment: 10 Information Obtained from: Patient Chief Complaint the patient is here for follow-up evaluation of her bilateral lower extremity ulcers Electronic Signature(s) Signed: 04/21/2017 12:30:45 PM By: Jordan Najjar MD Entered By: [name] on 04/20/2017 16:51:55 [name], [name] ([id number]) -------------------------------------------------------------------------------- HPI Details [name] Date of Service: 04/20/2017 10:15 AM Medical Record Patient Account Number: [id number] Number: Treating RN: Jordan Mealy, RN, BSN, Jordan Jun 14, 1931 [phone] y.o. Other Clinician: Date of Birth/Sex: Female) Treating [name] Primary Care Provider: Guy Hopkins Provider/Extender: G Referring Provider: Guy Hopkins Weeks in Treatment: 10 History of Present Illness HPI Description: 06/10/16; this is an 83 year old woman who lives near [place] with family members. She saw her primary doctor roughly over a week ago and was referred to the ER for a cellulitis in the right medial leg. She was given a round of IV antibiotics in the emergency room and discharged on oral Keflex which she is taking. She is been left with a uncomfortable leg area on the right medial lower leg. Her ABIs in this clinic were 0.8 on the right 0.6 on the left. She does not describe claudication. She is not a diabetic. Lab work in the ER showed a normal BUN and creatinine on 6/20 at 18 and 0.92 respectively white count was 10.1 hemoglobin at  13.8. She did not have any imaging studies. I can see no vascular evaluations in Epic. She does not have a prior wound history 06/17/16; the area affected on her right medial ankle is an area I thought was due to tissue damage from cellulitis last week. She is going for venous reflux studies this afternoon, these were not ordered in this clinic at least not by me. The patient does not give a prior history of damage to the skin in this area although I wonder if she actually might not of noticed it. We have been using's Aquacel Ag under a wrap although we will not be able to wrap her today 06/24/16: pt reports today she hasn't heard from the vascular clinic regarding proceeding with arterial studies. our office will assist with this today. she denies s/s of infection. denies problems with her wraps. 07/07/16; since I last saw this woman she was admitted to hospital from 7/16 through 7/18 with sepsis syndrome felt to be secondary to her ulcer. She was treated with Vanco and Zosyn the hospital discharged on Septra. Blood cultures were negative. Urine culture showed multiple species she is now feeling well using Santyl with border foam to her wound. [facility] 07/14/16; no major improvement here. She has a quarter-sized wound covered with thick adherent slough on the lateral aspect of her right leg with several surrounding satellite lesions in a similar state. She has had 2 recent bouts of cellulitis, one before she came to this clinic and one required a recent hospitalization. I have asked for arterial studies I don't believe these have ever been done. My notes suggest from early in July that she went for reflux studies although I don't know that I have ever seen these either, she did have a DVT rule out but I did  not order this, this did not show a DVT but did show a Baker's cyst 07/21/2016 -- the patient had an arterial duplex study done with waveform suggestive of right  femoropopliteal disease. There is also small vessel disease bilaterally. Right ABI was in the normal range of 1.0 and a TBI was 0.74. Left ABI is abnormal at 0.69 with a TBI of 0.40. Three-vessel runoff on the right and a two-vessel runoff on the left. She has an appointment to see [name] later today. 07/28/2016 -- was seen by [name] who reviewed her arterial studies with an ABI of 0.94 on the right and 0.69 on the left with normal right toe brachial index. Duplex showed diffuse atherosclerosis without discrete Sons, Jordan J. ([id number]) stenosis and there was a three-vessel runoff on the right below the knee. Based on these studies and the location of the ulcer he thought it was venous commended she continue with wound care consults. 08/04/16; I note the patient's reasonably functional arterial supply which is fortunate. The wound bed appears to be smaller. Debrided of surface slough. She is been using Iodoflex 08/11/16: Only a very small open area remains. However it has some depth 08/18/16; small open area is smaller. Still open however. 08/25/16; the area has totally closed down and epithelialized. READMISSION 02/09/17; this is an 83 year old woman who is not a diabetic or smoker. She was in our clinic in the summer into mid-September with a wound on her right medial calf that. This eventually closed down. I thought this was mostly venous. She did have evidence of PAD and she was seen by [name]. At that point she had an ABI of 0.94 on the right 0.69 on the left with a normal right toe brachial index. It was felt that she had enough blood flow to close her wound and that happened. She was discharged with stockings although I don't get the sense that she really use them she tells me that roughly a week or 2 she developed swelling in her bilateral lower legs and then noted wounds on her right lateral malleolus and left medial calf. The on this she is not really sure how this happened.  She is not having a lot of pain although she is not ambulatory that much only in her home. Other than weight loss she has not noticed any other issues no specific claudication no chest pain. 02/16/17; the patient continues with 2 wounds on the right lateral malleolus and left medial calf. Both of these are painful. She has chronic venous insufficiency but also known PAD and is previously seen [name]. We have arranged vascular follow-up with [name] to look at the arterial situation 02/25/2017 -- patient was brought in today because she missed her appointment last week with Dr. Leanord Hawking and is having a lot of pain and drainage from her wounds. The interim she was seen by Dr. Bascom Levels who did a duplex of the right lower extremity which shows a right ABI of 0.95 and left ABI 0.74. There was also a abdominal aortogram with a bilateral extremity angiography done for significant peripheral vascular disease. The right lower extremity showed diffuse SFA and popliteal disease with significant stenosis affecting the TP trunk just before the origin of the peritoneal artery. The left lower extremity showed moderate left SFA stenosis with occluded popliteal artery and tibioperoneal vessels with only visible collateral. The opinion was the patient had no endovascular surgical revascularization options of the left lower extremity and Dr. Bascom Levels would  discuss it with Dr. Randie Heinz for a second opinion. Endovascular intervention on the right TP trunk could be considered. 03/03/17; the patient arrives back today having last seen Dr. Meyer Russel. As noted she has severe PAD which is not amenable to either bypass surgery or endovascular interventions on the left. Endovascular interventions in the right TP trunk could be considered. She has large wounds on the right medial leg and a sizable wound on the right lateral leg. Small wound on the left medial leg. She is really in a lot of discomfort. Using Santyl on the right and I  believe Prisma on the left. 03/10/17; the patient arrives today again with large wounds on the right lateral lower extremity, right medial lower extremity. And a small punched out wound on the left medial lower extremity. These are in the setting of severe non-revascularizable PAD. She continues to complain of discomfort although I don't think right now this is unmanageable. We have been using Santyl. We are arranging getting [facility] home health [facility]] 03/16/17- patient is here for follow-up evaluation of her bilateral lower extremity ulcers. She has an appointment on 4/11 for an arthrectomy per [name] at [facility]. She is continuing to [place]. Her son is doing her dressing changes 03/30/17 still using santyl. sincer her last visit she was admitted from 4/11-4/12 for arthrectomy and balloon angioplasty of the right distal popliteal artery with TP trunk and peritoneal arteries. she is on dual antiplatelet ZONIA, CAPLIN. ([id number]) and states her pain is somewhat better on the right 04/13/17 still using santyl to all wounds. Area on the right medial and left lateral look better. Still difficulties with the right lateral wouind. Combination of arterial insufficiency and Venous stasis No revascularization options on the left. Possible interventions on the right (see above) 04/20/17; the patient is still using Santyl to all wound areas. We've had some improvement in the right medial and left lateral. Patient has a combination of arterial and venous insufficiency. No revascularization options are available to the patient on the left however possible interventions on the right. Electronic Signature(s) Signed: 04/21/2017 12:30:45 PM By: Jordan Najjar MD Entered By: [name] on 04/20/2017 16:53:21 [name], [name] ([id number]) -------------------------------------------------------------------------------- Physical Exam Details [name], [name]. Date of Service: 04/20/2017  10:15 AM Medical Record Patient Account Number: [id number] Number: Treating RN: Jordan Mealy, RN, BSN, Jordan [birth year] [phone] y.o. Other Clinician: Date of Birth/Sex: Female) Treating Masayuki Sakai Primary Care Provider: Guy Hopkins Provider/Extender: G Referring Provider: Guy Hopkins Weeks in Treatment: 10 Constitutional Sitting or standing Blood Pressure is within target range for patient.. Pulse regular and within target range for patient.Marland Kitchen Respirations regular, non-labored and within target range.. Temperature is normal and within the target range for the patient.. Patient's appearance is neat and clean. Appears in no acute distress. Well nourished and well developed.. Notes Wound exam; area on the right medial leg and medial left leg looked better. Left lateral leg also looks as though it has more healthy granulation. No debridement was done today. Electronic Signature(s) Signed: 04/21/2017 12:30:45 PM By: Jordan Najjar MD Entered By: [name] on 04/20/2017 16:54:11 [name], [name] ([id number]) -------------------------------------------------------------------------------- Physician Orders Details [name], [name]. Date of Service: 04/20/2017 10:15 AM Medical Record Patient Account Number: [id number] Number: Treating RN: Jordan Mealy, RN, BSN, Jordan Apr 17, 1931 [phone] y.o. Other Clinician: Date of Birth/Sex: Female) Treating Sapir Lavey Primary Care Provider: Guy Hopkins Provider/Extender: G Referring Provider: Gabriel Hopkins in Treatment: 10 Verbal / Phone  Orders: No Diagnosis Coding Wound Cleansing Wound #2 Right,Lateral Malleolus o Cleanse wound with mild soap and water Wound #3 Left,Medial Lower Leg o Cleanse wound with mild soap and water Wound #4 Right,Medial Lower Leg o Cleanse wound with mild soap and water Anesthetic Wound #2 Right,Lateral Malleolus o Topical Lidocaine 4% cream applied to wound bed prior to  debridement - clinic use only Wound #3 Left,Medial Lower Leg o Topical Lidocaine 4% cream applied to wound bed prior to debridement - clinic use only Wound #4 Right,Medial Lower Leg o Topical Lidocaine 4% cream applied to wound bed prior to debridement - clinic use only Primary Wound Dressing Wound #2 Right,Lateral Malleolus o Santyl Ointment Wound #3 Left,Medial Lower Leg o Santyl Ointment Wound #4 Right,Medial Lower Leg o Santyl Ointment Secondary Dressing Wound #2 Right,Lateral Malleolus o Conform/Kerlix o Non-adherent pad o Other - Ace Wrap lightly to [name], [name]. ([id number]) Wound #3 Left,Medial Lower Leg o Conform/Kerlix o Non-adherent pad o Other - Ace Wrap lightly to Wound #4 Right,Medial Lower Leg o Conform/Kerlix o Non-adherent pad o Other - Ace Wrap lightly to Dressing Change Frequency Wound #2 Right,Lateral Malleolus o Change dressing every day. - HHRN twice weekly. May teach available family member wound care for additional days. Wound #3 Left,Medial Lower Leg o Change dressing every day. - HHRN twice weekly. May teach available family member wound care for additional days. Wound #4 Right,Medial Lower Leg o Change dressing every day. - HHRN twice weekly. May teach available family member wound care for additional days. Follow-up Appointments Wound #2 Right,Lateral Malleolus o Return Appointment in 1 week. Wound #3 Left,Medial Lower Leg o Return Appointment in 1 week. Wound #4 Right,Medial Lower Leg o Return Appointment in 1 week. Edema Control Wound #2 Right,Lateral Malleolus o Elevate legs to the level of the heart and pump ankles as often as possible Wound #3 Left,Medial Lower Leg o Elevate legs to the level of the heart and pump ankles as often as possible Wound #4 Right,Medial Lower Leg o Elevate legs to the level of the heart and pump ankles as often as possible Additional Orders /  Instructions Wound #2 Right,Lateral Malleolus o Increase protein intake. [name], [name] ([id number]) Wound #3 Left,Medial Lower Leg o Increase protein intake. Wound #4 Right,Medial Lower Leg o Increase protein intake. Home Health Wound #2 Right,Lateral Malleolus o Initiate Home Health for [facility]illed Nursing - [facility] o Continue Home Health Visits - Twice weekly. Patient comes to wound care center on Tuesdays. o Home Health Nurse may visit PRN to address patientos wound care needs. o FACE TO FACE ENCOUNTER: MEDICARE and MEDICAID PATIENTS: I certify that this patient is under my care and that I had a face-to-face encounter that meets the physician face-to-face encounter requirements with this patient on this date. The encounter with the patient was in whole or in part for the following MEDICAL CONDITION: (primary reason for Home Healthcare) MEDICAL NECESSITY: I certify, that based on my findings, NURSING services are a medically necessary home health service. HOME BOUND STATUS: I certify that my clinical findings support that this patient is homebound (i.e., Due to illness or injury, pt requires aid of supportive devices such as crutches, cane, wheelchairs, walkers, the use of special transportation or the assistance of another person to leave their place of residence. There is a normal inability to leave the home and doing so requires considerable and taxing effort. Other absences are for medical reasons / religious services and are infrequent or  of short duration when for other reasons). o If current dressing causes regression in wound condition, may D/C ordered dressing product/s and apply Normal Saline Moist Dressing daily until next Wound Healing Center / Other MD appointment. Notify Wound Healing Center of regression in wound condition at [phone]. o Please direct any NON-WOUND related issues/requests for orders to patient's Primary  Care Physician Wound #3 Left,Medial Lower Leg o Initiate Home Health for [facility] - [facility] o Continue Home Health Visits - Twice weekly. Patient comes to wound care center on Tuesdays. o Home Health Nurse may visit PRN to address patientos wound care needs. o FACE TO FACE ENCOUNTER: MEDICARE and MEDICAID PATIENTS: I certify that this patient is under my care and that I had a face-to-face encounter that meets the physician face-to-face encounter requirements with this patient on this date. The encounter with the patient was in whole or in part for the following MEDICAL CONDITION: (primary reason for Home Healthcare) MEDICAL NECESSITY: I certify, that based on my findings, NURSING services are a medically necessary home health service. HOME BOUND STATUS: I certify that my clinical findings support that this patient is homebound (i.e., Due to illness or injury, pt requires aid of supportive devices such as crutches, cane, wheelchairs, walkers, the use of special transportation or the assistance of another person to leave their place of residence. There is a normal inability to leave the home and doing so requires considerable and taxing effort. Other absences are for medical reasons / religious services and are infrequent or of short duration when for other reasons). o If current dressing causes regression in wound condition, may D/C ordered dressing product/s and apply Normal Saline Moist Dressing daily until next Wound Healing Center / Other MD appointment. Notify Wound Healing Center of regression in wound condition at [phone]. [name], [name] ([id number]) o Please direct any NON-WOUND related issues/requests for orders to patient's Primary Care Physician Wound #4 Right,Medial Lower Leg o Initiate Home [facility] - [facility] o Continue Home Health Visits - Twice weekly. Patient comes to wound care center on Tuesdays. o  Home Health Nurse may visit PRN to address patientos wound care needs. o FACE TO FACE ENCOUNTER: MEDICARE and MEDICAID PATIENTS: I certify that this patient is under my care and that I had a face-to-face encounter that meets the physician face-to-face encounter requirements with this patient on this date. The encounter with the patient was in whole or in part for the following MEDICAL CONDITION: (primary reason for Home Healthcare) MEDICAL NECESSITY: I certify, that based on my findings, NURSING services are a medically necessary home health service. HOME BOUND STATUS: I certify that my clinical findings support that this patient is homebound (i.e., Due to illness or injury, pt requires aid of supportive devices such as crutches, cane, wheelchairs, walkers, the use of special transportation or the assistance of another person to leave their place of residence. There is a normal inability to leave the home and doing so requires considerable and taxing effort. Other absences are for medical reasons / religious services and are infrequent or of short duration when for other reasons). o If current dressing causes regression in wound condition, may D/C ordered dressing product/s and apply Normal Saline Moist Dressing daily until next Wound Healing Center / Other MD appointment. Notify Wound Healing Center of regression in wound condition at [phone]. o Please direct any NON-WOUND related issues/requests for orders to patient's Primary Care Physician Electronic Signature(s) Signed: 04/20/2017  5:27:57 PM By: Elpidio Eric BSN, RN Signed: 04/21/2017 12:30:45 PM By: Jordan Najjar MD Entered By: Elpidio Eric on 04/20/2017 10:21:04 [name] ([id number]) -------------------------------------------------------------------------------- Problem List Details Patient Name: SIRA, ADSIT. Date of Service: 04/20/2017 10:15 AM Medical Record Patient Account Number:  [id number] Number: Treating RN: Jordan Mealy, RN, BSN, Jordan 03-Apr-1931 [phone] y.o. 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/09/2017 Yes muscle L97.221 Non-pressure chronic ulcer of left calf limited to 02/09/2017 Yes breakdown of skin I70.233 Atherosclerosis of native arteries of right leg with 02/25/2017 Yes ulceration of ankle I70.243 Atherosclerosis of native arteries of left leg with ulceration 02/25/2017 Yes of ankle Inactive Problems Resolved Problems Electronic Signature(s) Signed: 04/21/2017 12:30:45 PM By: Jordan Najjar MD Entered By: [name] on 04/20/2017 16:51:41 [name], [name] ([id number]) -------------------------------------------------------------------------------- Progress Note Details [name] Date of Service: 04/20/2017 10:15 AM Medical Record Patient Account Number: [id number] Number: Treating RN: Jordan Mealy, RN, BSN, [name] 11, 1932 [phone] y.o. Other Clinician: Date of Birth/Sex: Female) Treating Khalee Mazo Primary Care Provider: Guy Hopkins Provider/Extender: G Referring Provider: Guy Hopkins Weeks in Treatment: 10 Subjective Chief Complaint Information obtained from Patient the patient is here for follow-up evaluation of her bilateral lower extremity ulcers History of Present Illness (HPI) 06/10/16; this is an 83 year old woman who lives near [place] with family members. She saw her primary doctor roughly over a week ago and was referred to the ER for a cellulitis in the right medial leg. She was given a round  of IV antibiotics in the emergency room and discharged on oral Keflex which she is taking. She is been left with a uncomfortable leg area on the right medial lower leg. Her ABIs in this clinic were 0.8 on the right 0.6 on the left. She does not describe claudication. She is not a diabetic. Lab work in the ER showed a normal BUN and creatinine on 6/20 at 18 and 0.92 respectively white count was 10.1 hemoglobin at 13.8. She did not have any imaging studies. I can see no vascular evaluations in Epic. She does not have a prior wound history 06/17/16; the area affected on her right medial ankle is an area I thought was due to tissue damage from cellulitis last week. She is going for venous reflux studies this afternoon, these were not ordered in this clinic at least not by me. The patient does not give a prior history of damage to the skin in this area although I wonder if she actually might not of noticed it. We have been using's Aquacel Ag under a wrap although we will not be able to wrap her today 06/24/16: pt reports today she hasn't heard from the vascular clinic regarding proceeding with arterial studies. our office will assist with this today. she denies s/s of infection. denies problems with her wraps. 07/07/16; since I last saw this woman she was admitted to hospital from 7/16 through 7/18 with sepsis syndrome felt to be secondary to her ulcer. She was treated with Vanco and Zosyn the hospital discharged on Septra. Blood cultures were negative. Urine culture showed multiple species she is now feeling well using Santyl with border foam to her wound. [facility] has [facility] 07/14/16; no major improvement here. She has a quarter-sized wound covered with thick adherent slough on the lateral aspect of her right leg with several surrounding satellite lesions in a similar state. She has had 2 recent bouts of cellulitis, one before she came to this clinic and one required a recent hospitalization. I  have asked for arterial studies I don't believe these have ever been done. My notes suggest from early in July that she went for reflux studies although I don't know that I have ever seen these either, she did have a DVT rule out but I did not order this, this did not show a DVT but did show a Baker's cyst 07/21/2016 -- the patient had an arterial duplex study done with waveform suggestive of right femoropopliteal disease. There is also small vessel disease bilaterally. Right ABI was in the normal range of 1.0 and a TBI [name], Shakesha J. ([id number]) was 0.74. Left ABI is abnormal at 0.69 with a TBI of 0.40. Three-vessel runoff on the right and a two-vessel runoff on the left. She has an appointment to see [name] later today. 07/28/2016 -- was seen by [name] who reviewed her arterial studies with an ABI of 0.94 on the right and 0.69 on the left with normal right toe brachial index. Duplex showed diffuse atherosclerosis without discrete stenosis and there was a three-vessel runoff on the right below the knee. Based on these studies and the location of the ulcer he thought it was venous commended she continue with wound care consults. 08/04/16; I note the patient's reasonably functional arterial supply which is fortunate. The wound bed appears to be smaller. Debrided of surface slough. She is been using Iodoflex 08/11/16: Only a very small open area remains. However it has some depth 08/18/16; small open area is smaller. Still open however. 08/25/16; the area has totally closed down and epithelialized. READMISSION 02/09/17; this is an 83 year old woman who is not a diabetic or smoker. She was in our clinic in the summer into mid-September with a wound on her right medial calf that. This eventually closed down. I thought this was mostly venous. She did have evidence of PAD and she was seen by [name]. At that point she had an ABI of 0.94 on the right 0.69 on the left with a normal right toe  brachial index. It was felt that she had enough blood flow to close her wound and that happened. She was discharged with stockings although I don't get the sense that she really use them she tells me that roughly a week or 2 she developed swelling in her bilateral lower legs and then noted wounds on her right lateral malleolus and left medial calf. The on this she is not really sure how this happened. She is not having a lot of pain although she is not ambulatory that much only in her home. Other than weight loss she has not noticed any other issues no specific claudication no chest pain. 02/16/17; the patient continues with 2 wounds on the right lateral malleolus and left medial calf. Both of these are painful. She has chronic venous insufficiency but also known PAD and is previously seen [name]. We have arranged vascular follow-up with [name] to look at the arterial situation 02/25/2017 -- patient was brought in today because she missed her appointment last week with Dr. Leanord Hawking and is having a lot of pain and drainage from her wounds. The interim she was seen by Dr.  Reeder who did a duplex of the right lower extremity which shows a right ABI of 0.95 and left ABI 0.74. There was also a abdominal aortogram with a bilateral extremity angiography done for significant peripheral vascular disease. The right lower extremity showed diffuse SFA and popliteal disease with significant stenosis affecting the TP trunk just before the origin of the peritoneal artery. The left lower extremity showed moderate left SFA stenosis with occluded popliteal artery and tibioperoneal vessels with only visible collateral. The opinion was the patient had no endovascular surgical revascularization options of the left lower extremity and Dr. Bascom Levels would discuss it with Dr. Randie Heinz for a second opinion. Endovascular intervention on the right TP trunk could be considered. 03/03/17; the patient arrives back today having last  seen Dr. Meyer Russel. As noted she has severe PAD which is not amenable to either bypass surgery or endovascular interventions on the left. Endovascular interventions in the right TP trunk could be considered. She has large wounds on the right medial leg and a sizable wound on the right lateral leg. Small wound on the left medial leg. She is really in a lot of discomfort. Using Santyl on the right and I believe Prisma on the left. 03/10/17; the patient arrives today again with large wounds on the right lateral lower extremity, right medial lower extremity. And a small punched out wound on the left medial lower extremity. These are in the setting of severe non-revascularizable PAD. She continues to complain of discomfort although I don't think right now this is unmanageable. We have been using Santyl. We are arranging getting her home health [facility] NELIAH, CUYLER ([id number]) 03/16/17- patient is here for follow-up evaluation of her bilateral lower extremity ulcers. She has an appointment on 4/11 for an arthrectomy per [name] at [facility]. She is continuing to [place]. Her son is doing her dressing changes 03/30/17 still using santyl. sincer her last visit she was admitted from 4/11-4/12 for arthrectomy and balloon angioplasty of the right distal popliteal artery with TP trunk and peritoneal arteries. she is on dual antiplatelet and states her pain is somewhat better on the right 04/13/17 still using santyl to all wounds. Area on the right medial and left lateral look better. Still difficulties with the right lateral wouind. Combination of arterial insufficiency and Venous stasis No revascularization options on the left. Possible interventions on the right (see above) 04/20/17; the patient is still using Santyl to all wound areas. We've had some improvement in the right medial and left lateral. Patient has a combination of arterial and venous insufficiency. No revascularization options are  available to the patient on the left however possible interventions on the right. Objective Constitutional Sitting or standing Blood Pressure is within target range for patient.. Pulse regular and within target range for patient.Marland Kitchen Respirations regular, non-labored and within target range.. Temperature is normal and within the target range for the patient.. Patient's appearance is neat and clean. Appears in no acute distress. Well nourished and well developed.. Vitals Time Taken: 10:00 AM, Height: 66 in, Weight: 159 lbs, BMI: 25.7, Temperature: 98.1 F, Pulse: 90 bpm, Respiratory Rate: 16 breaths/min, Blood Pressure: 127/64 mmHg. General Notes: Wound exam; area on the right medial leg and medial left leg looked better. Left lateral leg also looks as though it has more healthy granulation. No debridement was done today. Integumentary (Hair, Skin) Wound #2 status is Open. Original cause of wound was Gradually Appeared. The wound is located on the Right,Lateral Malleolus. The wound  measures 3.4cm length x 2cm width x 0.2cm depth; 5.341cm^2 area and 1.068cm^3 volume. There is Fat Layer (Subcutaneous Tissue) Exposed exposed. There is no tunneling or undermining noted. There is a medium amount of serosanguineous drainage noted. The wound margin is flat and intact. There is medium (34-66%) pink granulation within the wound bed. There is a medium (34- 66%) amount of necrotic tissue within the wound bed including Adherent Slough. The periwound skin appearance exhibited: Induration, Hemosiderin Staining. The periwound skin appearance did not exhibit: Callus, Crepitus, Excoriation, Rash, Scarring, Dry/Scaly, Maceration, Atrophie Blanche, Cyanosis, Ecchymosis, Mottled, Pallor, Rubor, Erythema. Periwound temperature was noted as No Abnormality. The periwound has tenderness on palpation. Wound #3 status is Open. Original cause of wound was Gradually Appeared. The wound is located on the Left,Medial Lower  Leg. The wound measures 2.8cm length x 2.9cm width x 0.1cm depth; 6.377cm^2 area and 0.638cm^3 volume. There is Fat Layer (Subcutaneous Tissue) Exposed exposed. There is no tunneling Gille, Seema J. ([id number]) or undermining noted. There is a medium amount of serosanguineous drainage noted. The wound margin is flat and intact. There is small (1-33%) pink granulation within the wound bed. There is a large (67-100%) amount of necrotic tissue within the wound bed including Adherent Slough. The periwound skin appearance exhibited: Hemosiderin Staining. The periwound skin appearance did not exhibit: Callus, Crepitus, Excoriation, Induration, Rash, Scarring, Dry/Scaly, Maceration, Atrophie Blanche, Cyanosis, Ecchymosis, Mottled, Pallor, Rubor, Erythema. Periwound temperature was noted as No Abnormality. The periwound has tenderness on palpation. Wound #4 status is Open. Original cause of wound was Gradually Appeared. The wound is located on the Right,Medial Lower Leg. The wound measures 6cm length x 4.5cm width x 0.1cm depth; 21.206cm^2 area and 2.121cm^3 volume. There is Fat Layer (Subcutaneous Tissue) Exposed exposed. There is no tunneling or undermining noted. There is a small amount of serosanguineous drainage noted. The wound margin is flat and intact. There is medium (34-66%) pink, pale granulation within the wound bed. There is a medium (34-66%) amount of necrotic tissue within the wound bed including Adherent Slough. Periwound temperature was noted as No Abnormality. The periwound has tenderness on palpation. 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 L97.221 - Non-pressure chronic ulcer of left calf limited to breakdown of skin I70.233 - Atherosclerosis of native arteries of right leg with ulceration of  ankle I70.243 - Atherosclerosis of native arteries of left leg with ulceration of ankle Plan Wound Cleansing: Wound #2 Right,Lateral Malleolus: Cleanse wound with mild soap and water Wound #3 Left,Medial Lower Leg: Cleanse wound with mild soap and water Wound #4 Right,Medial Lower Leg: Cleanse wound with mild soap and water Anesthetic: Wound #2 Right,Lateral Malleolus: Topical Lidocaine 4% cream applied to wound bed prior to debridement - clinic use only Wound #3 Left,Medial Lower Leg: Topical Lidocaine 4% cream applied to wound bed prior to debridement - clinic use only Wound #4 Right,Medial Lower Leg: Jordan BandaFULLER, Jordan J. ([id number]) Topical Lidocaine 4% cream applied to wound bed prior to debridement - clinic use only Primary Wound Dressing: Wound #2 Right,Lateral Malleolus: Santyl Ointment Wound #3 Left,Medial Lower Leg: Santyl Ointment Wound #4 Right,Medial Lower Leg: Santyl Ointment Secondary Dressing: Wound #2 Right,Lateral Malleolus: Conform/Kerlix Non-adherent pad Other - Ace Wrap lightly to Wound #3 Left,Medial Lower Leg: Conform/Kerlix Non-adherent pad Other - Ace Wrap lightly to Wound #4 Right,Medial Lower Leg: Conform/Kerlix Non-adherent pad Other - Ace  Wrap lightly to Dressing Change Frequency: Wound #2 Right,Lateral Malleolus: Change dressing every day. - HHRN twice weekly. May teach available family member wound care for additional days. Wound #3 Left,Medial Lower Leg: Change dressing every day. - HHRN twice weekly. May teach available family member wound care for additional days. Wound #4 Right,Medial Lower Leg: Change dressing every day. - HHRN twice weekly. May teach available family member wound care for additional days. Follow-up Appointments: Wound #2 Right,Lateral Malleolus: Return Appointment in 1 week. Wound #3 Left,Medial Lower Leg: Return Appointment in 1 week. Wound #4 Right,Medial Lower Leg: Return Appointment in 1 week. Edema  Control: Wound #2 Right,Lateral Malleolus: Elevate legs to the level of the heart and pump ankles as often as possible Wound #3 Left,Medial Lower Leg: Elevate legs to the level of the heart and pump ankles as often as possible Wound #4 Right,Medial Lower Leg: Elevate legs to the level of the heart and pump ankles as often as possible Additional Orders / Instructions: Wound #2 Right,Lateral Malleolus: Increase protein intake. Wound #3 Left,Medial Lower Leg: Increase protein intake. Wound #4 Right,Medial Lower Leg: Ruest, Adison J. ([id number]) Increase protein intake. Home Health: Wound #2 Right,Lateral Malleolus: Initiate Home Health for Skilled Nursing - [facility] Continue Home Health Visits - Twice weekly. Patient comes to wound care center on Tuesdays. Home Health Nurse may visit PRN to address patient s wound care needs. FACE TO FACE ENCOUNTER: MEDICARE and MEDICAID PATIENTS: I certify that this patient is under my care and that I had a face-to-face encounter that meets the physician face-to-face encounter requirements with this patient on this date. The encounter with the patient was in whole or in part for the following MEDICAL CONDITION: (primary reason for Home Healthcare) MEDICAL NECESSITY: I certify, that based on my findings, NURSING services are a medically necessary home health service. HOME BOUND STATUS: I certify that my clinical findings support that this patient is homebound (i.e., Due to illness or injury, pt requires aid of supportive devices such as crutches, cane, wheelchairs, walkers, the use of special transportation or the assistance of another person to leave their place of residence. There is a normal inability to leave the home and doing so requires considerable and taxing effort. Other absences are for medical reasons / religious services and are infrequent or of short duration when for other reasons). If current dressing causes regression in wound  condition, may D/C ordered dressing product/s and apply Normal Saline Moist Dressing daily until next Wound Healing Center / Other MD appointment. Notify Wound Healing Center of regression in wound condition at [phone]. Please direct any NON-WOUND related issues/requests for orders to patient's Primary Care Physician Wound #3 Left,Medial Lower Leg: Initiate Home Health for Skilled [facility] - [facility] Continue Home Health Visits - Twice weekly. Patient comes to wound care center on Tuesdays. Home Health Nurse may visit PRN to address patient s wound care needs. FACE TO FACE ENCOUNTER: MEDICARE and MEDICAID PATIENTS: I certify that this patient is under my care and that I had a face-to-face encounter that meets the physician face-to-face encounter requirements with this patient on this date. The encounter with the patient was in whole or in part for the following MEDICAL CONDITION: (primary reason for Home Healthcare) MEDICAL NECESSITY: I certify, that based on my findings, NURSING services are a medically necessary home health service. HOME BOUND STATUS: I certify that my clinical findings support that this patient is homebound (i.e., Due to illness or injury, pt  requires aid of supportive devices such as crutches, cane, wheelchairs, walkers, the use of special transportation or the assistance of another person to leave their place of residence. There is a normal inability to leave the home and doing so requires considerable and taxing effort. Other absences are for medical reasons / religious services and are infrequent or of short duration when for other reasons). If current dressing causes regression in wound condition, may D/C ordered dressing product/s and apply Normal Saline Moist Dressing daily until next Wound Healing Center / Other MD appointment. Notify Wound Healing Center of regression in wound condition at [phone]. Please direct any NON-WOUND related  issues/requests for orders to patient's Primary Care Physician Wound #4 Right,Medial Lower Leg: Initiate Home [facility] for Skilled [facility] - [facility] Continue Home Health Visits - Twice weekly. Patient comes to wound care center on Tuesdays. Home Health Nurse may visit PRN to address patient s wound care needs. FACE TO FACE ENCOUNTER: MEDICARE and MEDICAID PATIENTS: I certify that this patient is under my care and that I had a face-to-face encounter that meets the physician face-to-face encounter requirements with this patient on this date. The encounter with the patient was in whole or in part for the following MEDICAL CONDITION: (primary reason for Home Healthcare) MEDICAL NECESSITY: I certify, that based on my findings, NURSING services are a medically necessary home health service. HOME BOUND STATUS: I certify that my clinical findings support that this patient is homebound (i.e., Due to illness or injury, pt requires aid of supportive devices such as crutches, cane, wheelchairs, walkers, the use of special transportation or the assistance of another person to leave their place of residence. There is a normal inability to leave the home and doing so requires considerable and taxing effort. Other absences are KENSINGTON, RIOS ([id number]) for medical reasons / religious services and are infrequent or of short duration when for other reasons). If current dressing causes regression in wound condition, may D/C ordered dressing product/s and apply Normal Saline Moist Dressing daily until next Wound Healing Center / Other MD appointment. Notify Wound Healing Center of regression in wound condition at [phone]. Please direct any NON-WOUND related issues/requests for orders to patient's Primary Care Physician #1 on continuous Santyl to all wound areas. #2 patient has a follow-up with [name] care and I'll need to see when she has another follow-up he mentioned a possible reattempted  revascularization on the right #3 we are making some improvements on these wounds although it is very slow Electronic Signature(s) Signed: 04/21/2017 12:30:45 PM By: Jordan Najjar MD Entered By: [name] on 04/20/2017 16:58:32 Laffey, [name] ([id number]) -------------------------------------------------------------------------------- SuperBill Details [name] Date of Service: 04/20/2017 Medical Record Patient Account Number: [id number] Number: Treating RN: Jordan Mealy, RN, BSN, Rembert Sink 1931/07/02 [phone] y.o. 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 L97.221 Non-pressure chronic ulcer of left calf limited to breakdown of skin I70.233 Atherosclerosis of native arteries of right leg with ulceration of ankle I70.243 Atherosclerosis of native arteries of left leg with ulceration of ankle Facility Procedures CPT4 Code: [id number] Description: 01027 - WOUND CARE VISIT-LEV 4 EST PT Modifier: Quantity: 1 Physician Procedures CPT4 Code Description: [id number] - WC PHYS  LEVEL 2 - EST PT ICD-10 Description Diagnosis I70.233 Atherosclerosis of native arteries of right leg with I70.243 Atherosclerosis of native arteries of left leg with u Modifier: ulceration of ank lceration of ankl Quantity: 1 le e Electronic Signature(s) Signed: 04/21/2017 12:30:45 PM By: Jordan Najjar MD Entered By: [name] on 04/20/2017 16:58:50

## 2017-04-26 ENCOUNTER — Telehealth: Payer: Self-pay | Admitting: Cardiovascular Disease

## 2017-04-26 NOTE — Telephone Encounter (Signed)
Returned call to patient's daughter, ok per DPR. Let her know the results are in Dr Jari SportsmanArida's basket to review and we will contact them with results once resulted by Dr Kirke CorinArida. She verbalized understanding and was appreciative.

## 2017-04-26 NOTE — Telephone Encounter (Signed)
Pt daughter would like ultrasound results. Please call.

## 2017-04-27 ENCOUNTER — Encounter: Payer: Medicare Other | Admitting: Internal Medicine

## 2017-04-27 DIAGNOSIS — I87333 Chronic venous hypertension (idiopathic) with ulcer and inflammation of bilateral lower extremity: Secondary | ICD-10-CM | POA: Diagnosis not present

## 2017-04-28 NOTE — Progress Notes (Addendum)
AZORIA, ABBETT ([id number]) Visit Report for 04/27/2017 Chief Complaint Document Details [name], [name]. Date of Service: 04/27/2017 3:00 PM Medical Record Patient Account Number: [id number] Number: Treating RN: Huel Coventry May 20, 1931 (84 y.o. Other Clinician: Date of Birth/Sex: Female) Treating ROBSON, MICHAEL Primary Care Provider: Guy Begin Provider/Extender: G Referring Provider: [name] in Treatment: 11 Information Obtained from: Patient Chief Complaint the patient is here for follow-up evaluation of her bilateral lower extremity ulcers Electronic Signature(s) Signed: 04/27/2017 4:47:06 PM By: Baltazar Najjar MD Entered By: Baltazar Najjar on 04/27/2017 95:62:13 NEWELL, FRATER ([id number]) -------------------------------------------------------------------------------- HPI Details [name] Date of Service: 04/27/2017 3:00 PM Medical Record Patient Account Number: [id number] Number: Treating RN: Huel Coventry [birth year] (83 y.o. Other Clinician: Date of Birth/Sex: Female) Treating ROBSON, MICHAEL Primary Care Provider: Guy Begin Provider/Extender: G Referring Provider: Guy Begin Weeks in Treatment: 11 History of Present Illness HPI Description: 06/10/16; this is an 83 year old woman who lives near [place] with family members. She saw her primary doctor roughly over a week ago and was referred to the ER for a cellulitis in the right medial leg. She was given a round of IV antibiotics in the emergency room and discharged on oral Keflex which she is taking. She is been left with a uncomfortable leg area on the right medial lower leg. Her ABIs in this clinic were 0.8 on the right 0.6 on the left. She does not describe claudication. She is not a diabetic. Lab work in the ER showed a normal BUN and creatinine on 6/20 at 18 and 0.92 respectively white count was 10.1 hemoglobin at 13.8. She did not have  any imaging studies. I can see no vascular evaluations in Epic. She does not have a prior wound history 06/17/16; the area affected on her right medial ankle is an area I thought was due to tissue damage from cellulitis last week. She is going for venous reflux studies this afternoon, these were not ordered in this clinic at least not by me. The patient does not give a prior history of damage to the skin in this area although I wonder if she actually might not of noticed it. We have been using's Aquacel Ag under a wrap although we will not be able to wrap her today 06/24/16: pt reports today she hasn't heard from the vascular clinic regarding proceeding with arterial studies. our office will assist with this today. she denies s/s of infection. denies problems with her wraps. 07/07/16; since I last saw this woman she was admitted to hospital from 7/16 through 7/18 with sepsis syndrome felt to be secondary to her ulcer. She was treated with Vanco and Zosyn the hospital discharged on Septra. Blood cultures were negative. Urine culture showed multiple species she is now feeling well using Santyl with border foam to her wound. [facility] 07/14/16; no major improvement here. She has a quarter-sized wound covered with thick adherent slough on the lateral aspect of her right leg with several surrounding satellite lesions in a similar state. She has had 2 recent bouts of cellulitis, one before she came to this clinic and one required a recent hospitalization. I have asked for arterial studies I don't believe these have ever been done. My notes suggest from early in July that she went for reflux studies although I don't know that I have ever seen these either, she did have a DVT rule out but I did not order this, this  did not show a DVT but did show a Baker's cyst 07/21/2016 -- the patient had an arterial duplex study done with waveform suggestive of right femoropopliteal disease. There is also  small vessel disease bilaterally. Right ABI was in the normal range of 1.0 and a TBI was 0.74. Left ABI is abnormal at 0.69 with a TBI of 0.40. Three-vessel runoff on the right and a two-vessel runoff on the left. She has an appointment to see Dr. Kirke Corin later today. 07/28/2016 -- was seen by Dr. Kirke Corin who reviewed her arterial studies with an ABI of 0.94 on the right and 0.69 on the left with normal right toe brachial index. Duplex showed diffuse atherosclerosis without discrete Spray, Jamaria J. ([id number]) stenosis and there was a three-vessel runoff on the right below the knee. Based on these studies and the location of the ulcer he thought it was venous commended she continue with wound care consults. 08/04/16; I note the patient's reasonably functional arterial supply which is fortunate. The wound bed appears to be smaller. Debrided of surface slough. She is been using Iodoflex 08/11/16: Only a very small open area remains. However it has some depth 08/18/16; small open area is smaller. Still open however. 08/25/16; the area has totally closed down and epithelialized. READMISSION 02/09/17; this is an 83 year old woman who is not a diabetic or smoker. She was in our clinic in the summer into mid-September with a wound on her right medial calf that. This eventually closed down. I thought this was mostly venous. She did have evidence of PAD and she was seen by Dr. Kirke Corin. At that point she had an ABI of 0.94 on the right 0.69 on the left with a normal right toe brachial index. It was felt that she had enough blood flow to close her wound and that happened. She was discharged with stockings although I don't get the sense that she really use them she tells me that roughly a week or 2 she developed swelling in her bilateral lower legs and then noted wounds on her right lateral malleolus and left medial calf. The on this she is not really sure how this happened. She is not having a lot of pain although  she is not ambulatory that much only in her home. Other than weight loss she has not noticed any other issues no specific claudication no chest pain. 02/16/17; the patient continues with 2 wounds on the right lateral malleolus and left medial calf. Both of these are painful. She has chronic venous insufficiency but also known PAD and is previously seen Dr. Kirke Corin. We have arranged vascular follow-up with Dr. Kirke Corin to look at the arterial situation 02/25/2017 -- patient was brought in today because she missed her appointment last week with Dr. Leanord Hawking and is having a lot of pain and drainage from her wounds. The interim she was seen by Dr. Bascom Levels who did a duplex of the right lower extremity which shows a right ABI of 0.95 and left ABI 0.74. There was also a abdominal aortogram with a bilateral extremity angiography done for significant peripheral vascular disease. The right lower extremity showed diffuse SFA and popliteal disease with significant stenosis affecting the TP trunk just before the origin of the peritoneal artery. The left lower extremity showed moderate left SFA stenosis with occluded popliteal artery and tibioperoneal vessels with only visible collateral. The opinion was the patient had no endovascular surgical revascularization options of the left lower extremity and Dr. Bascom Levels would discuss it with Dr.  Randie Heinz for a second opinion. Endovascular intervention on the right TP trunk could be considered. 03/03/17; the patient arrives back today having last seen Dr. Meyer Russel. As noted she has severe PAD which is not amenable to either bypass surgery or endovascular interventions on the left. Endovascular interventions in the right TP trunk could be considered. She has large wounds on the right medial leg and a sizable wound on the right lateral leg. Small wound on the left medial leg. She is really in a lot of discomfort. Using Santyl on the right and I believe Prisma on the left. 03/10/17; the  patient arrives today again with large wounds on the right lateral lower extremity, right medial lower extremity. And a small punched out wound on the left medial lower extremity. These are in the setting of severe non-revascularizable PAD. She continues to complain of discomfort although I don't think right now this is unmanageable. We have been using Santyl. We are arranging getting [facility] home health [facility]] 03/16/17- patient is here for follow-up evaluation of her bilateral lower extremity ulcers. She has an appointment on 4/11 for an arthrectomy per Dr. Kirke Corin at [facility]. She is continuing to [place]. Her son is doing her dressing changes 03/30/17 still using santyl. sincer her last visit she was admitted from 4/11-4/12 for arthrectomy and balloon angioplasty of the right distal popliteal artery with TP trunk and peritoneal arteries. she is on dual antiplatelet AASIYA, CREASEY. ([id number]) and states her pain is somewhat better on the right 04/13/17 still using santyl to all wounds. Area on the right medial and left lateral look better. Still difficulties with the right lateral wouind. Combination of arterial insufficiency and Venous stasis No revascularization options on the left. Possible interventions on the right (see above) 04/20/17; the patient is still using Santyl to all wound areas. We've had some improvement in the right medial and left lateral. Patient has a combination of arterial and venous insufficiency. No revascularization options are available to the patient on the left however possible interventions on the right. 04/27/17; I continued using Santyl to all wound areas. We've had improvement in the right medial wound and the left lateral wound. The right lateral lower leg wound has remained static. She arrives today with a lot more edema in her bilateral legs this is pitting. She does not complain of chest pain or shortness of breath tells me she has no prior cardiac  issues Electronic Signature(s) Signed: 04/27/2017 4:47:06 PM By: Baltazar Najjar MD Entered By: Baltazar Najjar on 04/27/2017 16:29:25 [name] ([id number]) -------------------------------------------------------------------------------- Physical Exam Details Patient Name: HENSLEY, AZIZ. Date of Service: 04/27/2017 3:00 PM Medical Record Patient Account Number: [id number] Number: Treating RN: Huel Coventry April 07, 1931 (83 y.o. Other Clinician: Date of Birth/Sex: Female) Treating ROBSON, MICHAEL Primary Care Provider: Guy Begin Provider/Extender: G Referring Provider: Guy Begin Weeks in Treatment: 11 Constitutional Sitting or standing Blood Pressure is within target range for patient.. Pulse regular and within target range for patient.Marland Kitchen Respirations regular, non-labored and within target range.. Temperature is normal and within the target range for the patient.. Patient's appearance is neat and clean. Appears in no acute distress. Well nourished and well developed.Marland Kitchen Respiratory Respiratory effort is easy and symmetric bilaterally. Rate is normal at rest and on room air.. Bilateral breath sounds are clear and equal in all lobes with no wheezes, rales or rhonchi.. Cardiovascular Soft midsystolic murmur gallop rhythm JVP elevated. She has palpable popliteal arteries. Pedal pulses absent bilaterally.. Gastrointestinal (GI) Abdomen  is soft and non-distended without masses or tenderness. Bowel sounds active in all quadrants.. No liver or spleen enlargement or tenderness.. Notes Wound exam area on her right medial leg in the medial left leg continue to look stable. Left lateral leg also healthy. No change in the right lateral leg. She has bilateral lower extremity edema and by bedside exam signs of fluid volume overload Electronic Signature(s) Signed: 04/27/2017 4:47:06 PM By: Baltazar Najjarobson, Michael MD Entered By: Baltazar Najjarobson, Michael on 04/27/2017 16:31:10 Wintle, [name]  ([id number]) -------------------------------------------------------------------------------- Physician Orders Details Patient Name: Jordan BandaFULLER, Liberta J. Date of Service: 04/27/2017 3:00 PM Medical Record Patient Account Number: [id number] Number: Treating RN: Huel CoventryWoody, Kim 1931-05-18 ([age] y.o. Other Clinician: Date of Birth/Sex: Female) Treating ROBSON, MICHAEL Primary Care Provider: [name], NARGES Provider/Extender: G Referring Provider: Gabriel RungFARAHI, NARGES Weeks in Treatment: [id number] Verbal / Phone Orders: No Diagnosis Coding Wound Cleansing Wound #2 Right,Lateral Malleolus o Cleanse wound with mild soap and water Wound #3 Left,Medial Lower Leg o Cleanse wound with mild soap and water Wound #4 Right,Medial Lower Leg o Cleanse wound with mild soap and water Anesthetic Wound #2 Right,Lateral Malleolus o Topical Lidocaine 4% cream applied to wound bed prior to debridement - clinic use only Wound #3 Left,Medial Lower Leg o Topical Lidocaine 4% cream applied to wound bed prior to debridement - clinic use only Wound #4 Right,Medial Lower Leg o Topical Lidocaine 4% cream applied to wound bed prior to debridement - clinic use only Primary Wound Dressing Wound #2 Right,Lateral Malleolus o Santyl Ointment Wound #3 Left,Medial Lower Leg o Santyl Ointment Wound #4 Right,Medial Lower Leg o Santyl Ointment Secondary Dressing Wound #2 Right,Lateral Malleolus o ABD pad o Conform/Kerlix - wrapped lightly Mirza, Khloie J. ([id number]) Wound #3 Left,Medial Lower Leg o ABD pad o Conform/Kerlix - Secured with Coban wrapped lightly Wound #4 Right,Medial Lower Leg o ABD pad o Conform/Kerlix - Secured with Coban wrapped lightly Dressing Change Frequency Wound #2 Right,Lateral Malleolus o Change dressing every day. - HHRN twice weekly. May teach available family member wound care for additional days. Wound #3 Left,Medial Lower Leg o Change dressing every day. -  HHRN twice weekly. May teach available family member wound care for additional days. Wound #4 Right,Medial Lower Leg o Change dressing every day. - HHRN twice weekly. May teach available family member wound care for additional days. Follow-up Appointments Wound #2 Right,Lateral Malleolus o Return Appointment in 1 week. Wound #3 Left,Medial Lower Leg o Return Appointment in 1 week. Wound #4 Right,Medial Lower Leg o Return Appointment in 1 week. Edema Control Wound #2 Right,Lateral Malleolus o Elevate legs to the level of the heart and pump ankles as often as possible Wound #3 Left,Medial Lower Leg o Elevate legs to the level of the heart and pump ankles as often as possible Wound #4 Right,Medial Lower Leg o Elevate legs to the level of the heart and pump ankles as often as possible Additional Orders / Instructions Wound #2 Right,Lateral Malleolus o Increase protein intake. Wound #3 Left,Medial Lower Leg o Increase protein intake. Jordan BandaFULLER, Zakyia J. ([id number]) Wound #4 Right,Medial Lower Leg o Increase protein intake. Home Health Wound #2 Right,Lateral Malleolus o Continue Home Health Visits - Twice weekly. Patient comes to wound care center on Tuesdays. o Home Health Nurse may visit PRN to address patientos wound care needs. o FACE TO FACE ENCOUNTER: MEDICARE and MEDICAID PATIENTS: I certify that this patient is under my care and that I had a face-to-face encounter that meets the physician face-to-face  encounter requirements with this patient on this date. The encounter with the patient was in whole or in part for the following MEDICAL CONDITION: (primary reason for Home Healthcare) MEDICAL NECESSITY: I certify, that based on my findings, NURSING services are a medically necessary home health service. HOME BOUND STATUS: I certify that my clinical findings support that this patient is homebound (i.e., Due to illness or injury, pt requires aid of supportive  devices such as crutches, cane, wheelchairs, walkers, the use of special transportation or the assistance of another person to leave their place of residence. There is a normal inability to leave the home and doing so requires considerable and taxing effort. Other absences are for medical reasons / religious services and are infrequent or of short duration when for other reasons). o If current dressing causes regression in wound condition, may D/C ordered dressing product/s and apply Normal Saline Moist Dressing daily until next Wound Healing Center / Other MD appointment. Notify Wound Healing Center of regression in wound condition at [phone]. o Please direct any NON-WOUND related issues/requests for orders to patient's Primary Care Physician Wound #3 Left,Medial Lower Leg o Continue Home Health Visits - Twice weekly. Patient comes to wound care center on Tuesdays. o Home Health Nurse may visit PRN to address patientos wound care needs. o FACE TO FACE ENCOUNTER: MEDICARE and MEDICAID PATIENTS: I certify that this patient is under my care and that I had a face-to-face encounter that meets the physician face-to-face encounter requirements with this patient on this date. The encounter with the patient was in whole or in part for the following MEDICAL CONDITION: (primary reason for Home Healthcare) MEDICAL NECESSITY: I certify, that based on my findings, NURSING services are a medically necessary home health service. HOME BOUND STATUS: I certify that my clinical findings support that this patient is homebound (i.e., Due to illness or injury, pt requires aid of supportive devices such as crutches, cane, wheelchairs, walkers, the use of special transportation or the assistance of another person to leave their place of residence. There is a normal inability to leave the home and doing so requires considerable and taxing effort. Other absences are for medical reasons / religious  services and are infrequent or of short duration when for other reasons). o If current dressing causes regression in wound condition, may D/C ordered dressing product/s and apply Normal Saline Moist Dressing daily until next Wound Healing Center / Other MD appointment. Notify Wound Healing Center of regression in wound condition at [phone]. o Please direct any NON-WOUND related issues/requests for orders to patient's Primary Care Physician Wound #4 Right,Medial Lower Leg o Continue Home Health Visits - Twice weekly. Patient comes to wound care center on Tuesdays. o Home Health Nurse may visit PRN to address patientos wound care needs. NOVALI, VOLLMAN ([id number]) o FACE TO FACE ENCOUNTER: MEDICARE and MEDICAID PATIENTS: I certify that this patient is under my care and that I had a face-to-face encounter that meets the physician face-to-face encounter requirements with this patient on this date. The encounter with the patient was in whole or in part for the following MEDICAL CONDITION: (primary reason for Home Healthcare) MEDICAL NECESSITY: I certify, that based on my findings, NURSING services are a medically necessary home health service. HOME BOUND STATUS: I certify that my clinical findings support that this patient is homebound (i.e., Due to illness or injury, pt requires aid of supportive devices such as crutches, cane, wheelchairs, walkers, the use of special transportation or the assistance  of another person to leave their place of residence. There is a normal inability to leave the home and doing so requires considerable and taxing effort. Other absences are for medical reasons / religious services and are infrequent or of short duration when for other reasons). o If current dressing causes regression in wound condition, may D/C ordered dressing product/s and apply Normal Saline Moist Dressing daily until next Wound Healing Center / Other MD appointment. Notify Wound  Healing Center of regression in wound condition at [phone]. o Please direct any NON-WOUND related issues/requests for orders to patient's Primary Care Physician Electronic Signature(s) Signed: 04/28/2017 4:43:34 PM By: Baltazar Najjar MD Signed: 04/29/2017 9:24:36 AM By: Elliot Gurney RN, BSN, Kim RN, BSN Previous Signature: 04/27/2017 4:47:06 PM Version By: Baltazar Najjar MD Previous Signature: 04/27/2017 5:19:45 PM Version By: Elliot Gurney RN, BSN, Kim RN, BSN Entered By: Elliot Gurney, RN, BSN, Kim on 04/28/2017 13:56:30 Chuba, [name] ([id number]) -------------------------------------------------------------------------------- Problem List Details Patient Name: SHULAMIS, WENBERG. Date of Service: 04/27/2017 3:00 PM Medical Record Patient Account Number: [id number] Number: Treating RN: Huel Coventry February 26, 1931 (84 y.o. 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 L97.221 Non-pressure chronic ulcer of left calf limited to 02/09/2017 Yes breakdown of skin I70.233 Atherosclerosis of native arteries of right leg with 02/25/2017 Yes ulceration of ankle I70.243 Atherosclerosis of native arteries of left leg with ulceration 02/25/2017 Yes of ankle Inactive Problems Resolved Problems Electronic Signature(s) Signed: 04/27/2017 4:47:06 PM By: Baltazar Najjar MD Entered By: Baltazar Najjar on 04/27/2017 16:28:08 [name] ([id number]) -------------------------------------------------------------------------------- Progress Note Details [name] Date of  Service: 04/27/2017 3:00 PM Medical Record Patient Account Number: [id number] Number: Treating RN: Huel Coventry 02/27/1931 (83 y.o. Other Clinician: Date of Birth/Sex: Female) Treating ROBSON, MICHAEL Primary Care Provider: Guy Begin Provider/Extender: G Referring Provider: [name] in Treatment: 11 Subjective Chief Complaint Information obtained from Patient the patient is here for follow-up evaluation of her bilateral lower extremity ulcers History of Present Illness (HPI) 06/10/16; this is an 83 year old woman who lives near [place] with family members. She saw her primary doctor roughly over a week ago and was referred to the ER for a cellulitis in the right medial leg. She was given a round of IV antibiotics in the emergency room and discharged on oral Keflex which she is taking. She is been left with a uncomfortable leg area on the right medial lower leg. Her ABIs in this clinic were 0.8 on the right 0.6 on the left. She does not describe claudication. She is not a diabetic. Lab work in the ER showed a normal BUN and creatinine on 6/20 at 18 and 0.92 respectively white count was 10.1 hemoglobin at 13.8. She did not have any imaging studies. I can see no vascular evaluations in Epic. She does not have a prior wound history 06/17/16; the area affected on her right medial ankle is an area I thought was due to tissue damage from cellulitis last week. She is going for venous reflux studies this afternoon, these were not ordered in this clinic at least not by me. The patient does not give a prior history of damage to the skin in  this area although I wonder if she actually might not of noticed it. We have been using's Aquacel Ag under a wrap although we will not be able to wrap her today 06/24/16: pt reports today she hasn't heard from the vascular clinic regarding proceeding with arterial studies. our office will assist with this today. she denies s/s of infection.  denies problems with her wraps. 07/07/16; since I last saw this woman she was admitted to hospital from 7/16 through 7/18 with sepsis syndrome felt to be secondary to her ulcer. She was treated with Vanco and Zosyn the hospital discharged on Septra. Blood cultures were negative. Urine culture showed multiple species she is now feeling well using Santyl with border foam to her wound. [facility] has [facility] 07/14/16; no major improvement here. She has a quarter-sized wound covered with thick adherent slough on the lateral aspect of her right leg with several surrounding satellite lesions in a similar state. She has had 2 recent bouts of cellulitis, one before she came to this clinic and one required a recent hospitalization. I have asked for arterial studies I don't believe these have ever been done. My notes suggest from early in July that she went for reflux studies although I don't know that I have ever seen these either, she did have a DVT rule out but I did not order this, this did not show a DVT but did show a Baker's cyst 07/21/2016 -- the patient had an arterial duplex study done with waveform suggestive of right femoropopliteal disease. There is also small vessel disease bilaterally. Right ABI was in the normal range of 1.0 and a TBI Hedgecock, Lashandra J. ([id number]) was 0.74. Left ABI is abnormal at 0.69 with a TBI of 0.40. Three-vessel runoff on the right and a two-vessel runoff on the left. She has an appointment to see Dr. Kirke Corin later today. 07/28/2016 -- was seen by Dr. Kirke Corin who reviewed her arterial studies with an ABI of 0.94 on the right and 0.69 on the left with normal right toe brachial index. Duplex showed diffuse atherosclerosis without discrete stenosis and there was a three-vessel runoff on the right below the knee. Based on these studies and the location of the ulcer he thought it was venous commended she continue with wound care consults. 08/04/16; I note the patient's  reasonably functional arterial supply which is fortunate. The wound bed appears to be smaller. Debrided of surface slough. She is been using Iodoflex 08/11/16: Only a very small open area remains. However it has some depth 08/18/16; small open area is smaller. Still open however. 08/25/16; the area has totally closed down and epithelialized. READMISSION 02/09/17; this is an 83 year old woman who is not a diabetic or smoker. She was in our clinic in the summer into mid-September with a wound on her right medial calf that. This eventually closed down. I thought this was mostly venous. She did have evidence of PAD and she was seen by Dr. Kirke Corin. At that point she had an ABI of 0.94 on the right 0.69 on the left with a normal right toe brachial index. It was felt that she had enough blood flow to close her wound and that happened. She was discharged with stockings although I don't get the sense that she really use them she tells me that roughly a week or 2 she developed swelling in her bilateral lower legs and then noted wounds on her right lateral malleolus and left medial calf. The on this she is  not really sure how this happened. She is not having a lot of pain although she is not ambulatory that much only in her home. Other than weight loss she has not noticed any other issues no specific claudication no chest pain. 02/16/17; the patient continues with 2 wounds on the right lateral malleolus and left medial calf. Both of these are painful. She has chronic venous insufficiency but also known PAD and is previously seen Dr. Kirke Corin. We have arranged vascular follow-up with Dr. Kirke Corin to look at the arterial situation 02/25/2017 -- patient was brought in today because she missed her appointment last week with Dr. Leanord Hawking and is having a lot of pain and drainage from her wounds. The interim she was seen by Dr. Bascom Levels who did a duplex of the right lower extremity which shows a right ABI of 0.95 and left ABI 0.74.  There was also a abdominal aortogram with a bilateral extremity angiography done for significant peripheral vascular disease. The right lower extremity showed diffuse SFA and popliteal disease with significant stenosis affecting the TP trunk just before the origin of the peritoneal artery. The left lower extremity showed moderate left SFA stenosis with occluded popliteal artery and tibioperoneal vessels with only visible collateral. The opinion was the patient had no endovascular surgical revascularization options of the left lower extremity and Dr. Bascom Levels would discuss it with Dr. Randie Heinz for a second opinion. Endovascular intervention on the right TP trunk could be considered. 03/03/17; the patient arrives back today having last seen Dr. Meyer Russel. As noted she has severe PAD which is not amenable to either bypass surgery or endovascular interventions on the left. Endovascular interventions in the right TP trunk could be considered. She has large wounds on the right medial leg and a sizable wound on the right lateral leg. Small wound on the left medial leg. She is really in a lot of discomfort. Using Santyl on the right and I believe Prisma on the left. 03/10/17; the patient arrives today again with large wounds on the right lateral lower extremity, right medial lower extremity. And a small punched out wound on the left medial lower extremity. These are in the setting of severe non-revascularizable PAD. She continues to complain of discomfort although I don't think right now this is unmanageable. We have been using Santyl. We are arranging getting her home health [facility] MAILLE, HALLIWELL ([id number]) 03/16/17- patient is here for follow-up evaluation of her bilateral lower extremity ulcers. She has an appointment on 4/11 for an arthrectomy per Dr. Kirke Corin at [facility]. She is continuing to [place]. Her son is doing her dressing changes 03/30/17 still using santyl. sincer her last visit she was  admitted from 4/11-4/12 for arthrectomy and balloon angioplasty of the right distal popliteal artery with TP trunk and peritoneal arteries. she is on dual antiplatelet and states her pain is somewhat better on the right 04/13/17 still using santyl to all wounds. Area on the right medial and left lateral look better. Still difficulties with the right lateral wouind. Combination of arterial insufficiency and Venous stasis No revascularization options on the left. Possible interventions on the right (see above) 04/20/17; the patient is still using Santyl to all wound areas. We've had some improvement in the right medial and left lateral. Patient has a combination of arterial and venous insufficiency. No revascularization options are available to the patient on the left however possible interventions on the right. 04/27/17; I continued using Santyl to all wound areas. We've had improvement  in the right medial wound and the left lateral wound. The right lateral lower leg wound has remained static. She arrives today with a lot more edema in her bilateral legs this is pitting. She does not complain of chest pain or shortness of breath tells me she has no prior cardiac issues Objective Constitutional Sitting or standing Blood Pressure is within target range for patient.. Pulse regular and within target range for patient.Marland Kitchen Respirations regular, non-labored and within target range.. Temperature is normal and within the target range for the patient.. Patient's appearance is neat and clean. Appears in no acute distress. Well nourished and well developed.. Vitals Time Taken: 3:03 PM, Height: 66 in, Weight: 159 lbs, BMI: 25.7, Temperature: 98.8 F, Pulse: 86 bpm, Respiratory Rate: 16 breaths/min, Blood Pressure: 111/81 mmHg. Respiratory Respiratory effort is easy and symmetric bilaterally. Rate is normal at rest and on room air.. Bilateral breath sounds are clear and equal in all lobes with no wheezes, rales or  rhonchi.. Cardiovascular Soft midsystolic murmur gallop rhythm JVP elevated. She has palpable popliteal arteries. Pedal pulses absent bilaterally.. Gastrointestinal (GI) Abdomen is soft and non-distended without masses or tenderness. Bowel sounds active in all quadrants.. No liver or spleen enlargement or tenderness.. General Notes: Wound exam area on her right medial leg in the medial left leg continue to look stable. Left lateral leg also healthy. No change in the right lateral leg. She has bilateral lower extremity edema and by KATIE, FARAONE. ([id number]) bedside exam signs of fluid volume overload Integumentary (Hair, Skin) Wound #2 status is Open. Original cause of wound was Gradually Appeared. The wound is located on the Right,Lateral Malleolus. The wound measures 2.5cm length x 2.1cm width x 0.2cm depth; 4.123cm^2 area and 0.825cm^3 volume. Wound #3 status is Open. Original cause of wound was Gradually Appeared. The wound is located on the Left,Medial Lower Leg. The wound measures 3cm length x 2.2cm width x 0.1cm depth; 5.184cm^2 area and 0.518cm^3 volume. Wound #4 status is Open. Original cause of wound was Gradually Appeared. The wound is located on the Right,Medial Lower Leg. The wound measures 5cm length x 6cm width x 0.1cm depth; 23.562cm^2 area and 2.356cm^3 volume. 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 I70.243 - Atherosclerosis of native arteries of left leg with ulceration of ankle Plan Wound Cleansing: Wound #2 Right,Lateral Malleolus: Cleanse wound with mild soap and water Wound #3 Left,Medial Lower Leg: Cleanse wound with mild  soap and water Wound #4 Right,Medial Lower Leg: Cleanse wound with mild soap and water Anesthetic: Wound #2 Right,Lateral Malleolus: Topical Lidocaine 4% cream applied to wound bed prior to debridement - clinic use only Wound #3 Left,Medial Lower Leg: Topical Lidocaine 4% cream applied to wound bed prior to debridement - clinic use only Wound #4 Right,Medial Lower Leg: Topical Lidocaine 4% cream applied to wound bed prior to debridement - clinic use only Drum, Akasia J. ([id number]) Primary Wound Dressing: Wound #2 Right,Lateral Malleolus: Santyl Ointment Wound #3 Left,Medial Lower Leg: Santyl Ointment Wound #4 Right,Medial Lower Leg: Santyl Ointment Secondary Dressing: Wound #2 Right,Lateral Malleolus: ABD pad Conform/Kerlix - wrapped lightly Wound #3 Left,Medial Lower Leg: ABD pad Conform/Kerlix - Secured with Coban wrapped lightly Wound #4 Right,Medial Lower Leg: ABD  pad Conform/Kerlix - Secured with Coban wrapped lightly Dressing Change Frequency: Wound #2 Right,Lateral Malleolus: Change dressing every day. - HHRN twice weekly. May teach available family member wound care for additional days. Wound #3 Left,Medial Lower Leg: Change dressing every day. - HHRN twice weekly. May teach available family member wound care for additional days. Wound #4 Right,Medial Lower Leg: Change dressing every day. - HHRN twice weekly. May teach available family member wound care for additional days. Follow-up Appointments: Wound #2 Right,Lateral Malleolus: Return Appointment in 1 week. Wound #3 Left,Medial Lower Leg: Return Appointment in 1 week. Wound #4 Right,Medial Lower Leg: Return Appointment in 1 week. Edema Control: Wound #2 Right,Lateral Malleolus: Elevate legs to the level of the heart and pump ankles as often as possible Wound #3 Left,Medial Lower Leg: Elevate legs to the level of the heart and pump ankles as often as possible Wound #4 Right,Medial Lower Leg: Elevate legs  to the level of the heart and pump ankles as often as possible Additional Orders / Instructions: Wound #2 Right,Lateral Malleolus: Increase protein intake. Wound #3 Left,Medial Lower Leg: Increase protein intake. Wound #4 Right,Medial Lower Leg: Increase protein intake. Home Health: Wound #2 Right,Lateral Malleolus: Continue Home Health Visits - Twice weekly. Patient comes to wound care center on Tuesdays. MAUDIE, SHINGLEDECKER ([id number]) Home Health Nurse may visit PRN to address patient s wound care needs. FACE TO FACE ENCOUNTER: MEDICARE and MEDICAID PATIENTS: I certify that this patient is under my care and that I had a face-to-face encounter that meets the physician face-to-face encounter requirements with this patient on this date. The encounter with the patient was in whole or in part for the following MEDICAL CONDITION: (primary reason for Home Healthcare) MEDICAL NECESSITY: I certify, that based on my findings, NURSING services are a medically necessary home health service. HOME BOUND STATUS: I certify that my clinical findings support that this patient is homebound (i.e., Due to illness or injury, pt requires aid of supportive devices such as crutches, cane, wheelchairs, walkers, the use of special transportation or the assistance of another person to leave their place of residence. There is a normal inability to leave the home and doing so requires considerable and taxing effort. Other absences are for medical reasons / religious services and are infrequent or of short duration when for other reasons). If current dressing causes regression in wound condition, may D/C ordered dressing product/s and apply Normal Saline Moist Dressing daily until next Wound Healing Center / Other MD appointment. Notify Wound Healing Center of regression in wound condition at [phone]. Please direct any NON-WOUND related issues/requests for orders to patient's Primary Care Physician Wound #3  Left,Medial Lower Leg: Continue Home Health Visits - Twice weekly. Patient comes to wound care center on Tuesdays. Home Health Nurse may visit PRN to address patient s wound care needs. FACE TO FACE ENCOUNTER: MEDICARE and MEDICAID PATIENTS: I certify that this patient is under my care and that I had a face-to-face encounter that meets the physician face-to-face encounter requirements with this patient on this date. The encounter with the patient was in whole or in part for the following MEDICAL CONDITION: (primary reason for Home Healthcare) MEDICAL NECESSITY: I certify, that based on my findings, NURSING services are a medically necessary home health service. HOME BOUND STATUS: I certify that my clinical findings support that this patient is homebound (i.e., Due to illness or injury, pt requires aid of supportive devices such as crutches, cane, wheelchairs, walkers, the use of special  transportation or the assistance of another person to leave their place of residence. There is a normal inability to leave the home and doing so requires considerable and taxing effort. Other absences are for medical reasons / religious services and are infrequent or of short duration when for other reasons). If current dressing causes regression in wound condition, may D/C ordered dressing product/s and apply Normal Saline Moist Dressing daily until next Wound Healing Center / Other MD appointment. Notify Wound Healing Center of regression in wound condition at [phone]. Please direct any NON-WOUND related issues/requests for orders to patient's Primary Care Physician Wound #4 Right,Medial Lower Leg: Continue Home Health Visits - Twice weekly. Patient comes to wound care center on Tuesdays. Home Health Nurse may visit PRN to address patient s wound care needs. FACE TO FACE ENCOUNTER: MEDICARE and MEDICAID PATIENTS: I certify that this patient is under my care and that I had a face-to-face encounter that  meets the physician face-to-face encounter requirements with this patient on this date. The encounter with the patient was in whole or in part for the following MEDICAL CONDITION: (primary reason for Home Healthcare) MEDICAL NECESSITY: I certify, that based on my findings, NURSING services are a medically necessary home health service. HOME BOUND STATUS: I certify that my clinical findings support that this patient is homebound (i.e., Due to illness or injury, pt requires aid of supportive devices such as crutches, cane, wheelchairs, walkers, the use of special transportation or the assistance of another person to leave their place of residence. There is a normal inability to leave the home and doing so requires considerable and taxing effort. Other absences are for medical reasons / religious services and are infrequent or of short duration when for other reasons). If current dressing causes regression in wound condition, may D/C ordered dressing product/s and apply Normal Saline Moist Dressing daily until next Wound Healing Center / Other MD appointment. Notify Wound Healing Center of regression in wound condition at [phone]. Please direct any NON-WOUND related issues/requests for orders to patient's Primary Care Physician NEASIA, FLEEMAN ([id number]) #1 I continue with Santyl but put her legs under compression with Kerlix and light Coban #2 she appears to be somewhat systemically fluid volume overloaded, I've asked her to go back to see her primary physician for follow-up of this. If necessary I'll call the doctor Electronic Signature(s) Signed: 04/29/2017 9:53:33 AM By: Elliot Gurney RN, BSN, Kim RN, BSN Signed: 05/11/2017 7:46:50 AM By: Baltazar Najjar MD Previous Signature: 04/27/2017 4:47:06 PM Version By: Baltazar Najjar MD Entered By: Elliot Gurney RN, BSN, Kim on 04/29/2017 09:53:33 Rippetoe, [name]  ([id number]) -------------------------------------------------------------------------------- SuperBill Details Patient Name: LORILEI, HORAN. Date of Service: 04/27/2017 Medical Record Patient Account Number: [id number] Number: Treating RN: Huel Coventry [birth year] (83 y.o. 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 L97.221 Non-pressure chronic ulcer of left calf limited to breakdown of skin I70.233 Atherosclerosis of native arteries of right leg with ulceration of ankle I70.243 Atherosclerosis of native arteries of left leg with ulceration of ankle Facility Procedures CPT4 Code: [id number] Description: 66440 - WOUND CARE VISIT-LEV 2 EST PT Modifier: Quantity: 1 Physician Procedures CPT4: Description Modifier Quantity Code [id number] - WC PHYS LEVEL 3 - EST PT  1 ICD-10 Description Diagnosis I87.333 Chronic venous hypertension (idiopathic) with ulcer and inflammation of bilateral lower extremity L97.221 Non-pressure chronic ulcer  of left calf limited to breakdown of skin I70.233 Atherosclerosis of native arteries of right leg with ulceration of ankle Electronic Signature(s) Signed: 04/27/2017 4:47:06 PM By: Baltazar Najjar MD Entered By: Baltazar Najjar on 04/27/2017 16:34:17

## 2017-04-28 NOTE — Progress Notes (Signed)
Jordan, Hopkins ([id number]) Visit Report for 04/27/2017 Arrival Information Details [name], [name]. Date of Service: 04/27/2017 3:00 PM Medical Record Patient Account Number: [id number] Number: Treating RN: Jordan Coventry 28-Jul-1931 (83 y.o. Other Clinician: Date of Birth/Sex: Female) Treating Hopkins, Jordan Primary Care [name]: [name] [name]/Extender: G Referring [name]: [name] in Treatment: 11 Visit Information History Since Last Visit Added or deleted any medications: No Patient Arrived: Wheel Chair Any new allergies or adverse reactions: No Arrival Time: 14:52 Had a fall or experienced change in No Accompanied By: son activities of daily living that may affect Transfer Assistance: Manual risk of falls: Patient Identification Verified: Yes Signs or symptoms of abuse/neglect since last No Secondary Verification Process Yes visito Completed: Hospitalized since last visit: No Patient Requires Transmission- No Has Dressing in Place as Prescribed: Yes Based Precautions: Pain Present Now: No Patient Has Alerts: Yes Patient Alerts: Patient on Blood Thinner Electronic Signature(s) Signed: 04/27/2017 5:19:45 PM By: Jordan Gurney, RN, BSN, Kim RN, BSN Entered By: Jordan Gurney, RN, BSN, Hopkins on 04/27/2017 15:03:33 Balaban, [name] ([id number]) -------------------------------------------------------------------------------- Clinic Level of Care Assessment Details [name], [name]. Date of Service: 04/27/2017 3:00 PM Medical Record Patient Account Number: [id number] Number: Treating RN: Jordan Coventry 08/23/1931 (83 y.o. Other Clinician: Date of Birth/Sex: Female) Treating Hopkins, Jordan Primary Care Sherissa Tenenbaum: [name] [name]/Extender: G Referring Hetty Linhart: [name] in Treatment: 11 Clinic Level of Care Assessment Items TOOL 4 Quantity Score []  - Use when only an EandM is performed on FOLLOW-UP visit  0 ASSESSMENTS - Nursing Assessment / Reassessment []  - Reassessment of Co-morbidities (includes updates in patient status) 0 X - Reassessment of Adherence to Treatment Plan 1 5 ASSESSMENTS - Wound and Skin Assessment / Reassessment X - Simple Wound Assessment / Reassessment - one wound 1 5 []  - Complex Wound Assessment / Reassessment - multiple wounds 0 []  - Dermatologic / Skin Assessment (not related to wound area) 0 ASSESSMENTS - Focused Assessment []  - Circumferential Edema Measurements - multi extremities 0 []  - Nutritional Assessment / Counseling / Intervention 0 []  - Lower Extremity Assessment (monofilament, tuning fork, pulses) 0 []  - Peripheral Arterial Disease Assessment (using hand held doppler) 0 ASSESSMENTS - Ostomy and/or Continence Assessment and Care []  - Incontinence Assessment and Management 0 []  - Ostomy Care Assessment and Management (repouching, etc.) 0 PROCESS - Coordination of Care []  - Simple Patient / Family Education for ongoing care 0 []  - Complex (extensive) Patient / Family Education for ongoing care 0 []  - Staff obtains Chiropractor, Records, Test Results / Process Orders 0 []  - Staff telephones HHA, Nursing Homes / Clarify orders / etc 0 Kolander, Sol J. ([id number]) []  - Routine Transfer to another Facility (non-emergent condition) 0 []  - Routine Hospital Admission (non-emergent condition) 0 []  - New Admissions / Manufacturing engineer / Ordering NPWT, Apligraf, etc. 0 []  - Emergency Hospital Admission (emergent condition) 0 X - Simple Discharge Coordination 1 10 []  - Complex (extensive) Discharge Coordination 0 PROCESS - Special Needs []  - Pediatric / Minor Patient Management 0 []  - Isolation Patient Management 0 []  - Hearing / Language / Visual special needs 0 []  - Assessment of Community assistance (transportation, D/C planning, etc.) 0 []  - Additional assistance / Altered mentation 0 []  - Support Surface(s) Assessment (bed, cushion, seat, etc.)  0 INTERVENTIONS - Wound Cleansing / Measurement X - Simple Wound Cleansing - one wound 1 5 []  - Complex Wound Cleansing - multiple wounds 0 X -  Wound Imaging (photographs - any number of wounds) 1 5 []  - Wound Tracing (instead of photographs) 0 X - Simple Wound Measurement - one wound 1 5 []  - Complex Wound Measurement - multiple wounds 0 INTERVENTIONS - Wound Dressings []  - Small Wound Dressing one or multiple wounds 0 X - Medium Wound Dressing one or multiple wounds 2 15 []  - Large Wound Dressing one or multiple wounds 0 []  - Application of Medications - topical 0 []  - Application of Medications - injection 0 Somers, Johnita J. ([id number]) INTERVENTIONS - Miscellaneous []  - External ear exam 0 []  - Specimen Collection (cultures, biopsies, blood, body fluids, etc.) 0 []  - Specimen(s) / Culture(s) sent or taken to Lab for analysis 0 []  - Patient Transfer (multiple staff / Michiel SitesHoyer Lift / Similar devices) 0 []  - Simple Staple / Suture removal (25 or less) 0 []  - Complex Staple / Suture removal (26 or more) 0 []  - Hypo / Hyperglycemic Management (close monitor of Blood Glucose) 0 []  - Ankle / Brachial Index (ABI) - do not check if billed separately 0 X - Vital Signs 1 5 Has the patient been seen at the hospital within the last three years: Yes Total Score: 70 Level Of Care: New/Established - Level 2 Electronic Signature(s) Signed: 04/27/2017 5:19:45 PM By: Jordan GurneyWoody, RN, BSN, Kim RN, BSN Entered By: Jordan GurneyWoody, RN, BSN, Hopkins on 04/27/2017 15:46:16 Trager, [name] ([id number]) -------------------------------------------------------------------------------- Encounter Discharge Information Details [name], Jordan J. Date of Service: 04/27/2017 3:00 PM Medical Record Patient Account Number: [id number] Number: Treating RN: [name] Mar 24, 1931 ([age] y.o. Other Clinician: Date of Birth/Sex: Female) Treating Hopkins, Jordan Primary Care Josaiah Muhammed: Jordan BeginFARAHI, NARGES Airelle Everding/Extender:  G Referring Ella Guillotte: Jordan RungFARAHI, NARGES Weeks in Treatment: 11 Encounter Discharge Information Items Discharge Pain Level: 2 Discharge Condition: Stable Ambulatory Status: Wheelchair Discharge Destination: Home Transportation: Private Auto Accompanied By: son Schedule Follow-up Appointment: Yes Medication Reconciliation completed and provided to Patient/Care Yes Fidela Cieslak: Provided on Clinical Summary of Care: 04/27/2017 Form Type Recipient Paper Patient CF Electronic Signature(s) Signed: 04/27/2017 5:19:45 PM By: Jordan GurneyWoody, RN, BSN, Kim RN, BSN Previous Signature: 04/27/2017 3:46:19 PM Version By: Gwenlyn PerkingMoore, Shelia Entered By: Jordan GurneyWoody, RN, BSN, Hopkins on 04/27/2017 15:47:52 Charlie, [name] ([id number]) -------------------------------------------------------------------------------- Lower Extremity Assessment Details [name], Jordan J. Date of Service: 04/27/2017 3:00 PM Medical Record Patient Account Number: [id number] Number: Treating RN: [name] Mar 24, 1931 ([age] y.o. Other Clinician: Date of Birth/Sex: Female) Treating Hopkins, Jordan Primary Care Tarvares Lant: Jordan BeginFARAHI, NARGES Jinnifer Montejano/Extender: G Referring Gyneth Hubka: Jordan RungFARAHI, NARGES Weeks in Treatment: 11 Vascular Assessment Pulses: Dorsalis Pedis Palpable: [Left:No] [Right:No] Doppler Audible: [Left:Yes] [Right:Yes] Posterior Tibial Palpable: [Left:No] [Right:No] Doppler Audible: [Left:Yes] [Right:Yes] Extremity colors, hair growth, and conditions: Extremity Color: [Left:Hyperpigmented] [Right:Hyperpigmented] Hair Growth on Extremity: [Left:No] [Right:No] Temperature of Extremity: [Left:Warm] [Right:Warm] Capillary Refill: [Left:> 3 seconds] [Right:> 3 seconds] Dependent Rubor: [Left:No] [Right:No] Blanched when Elevated: [Left:No] [Right:No] Lipodermatosclerosis: [Left:No] [Right:No] Toe Nail Assessment Left: Right: Thick: Yes Yes Discolored: Yes Yes Deformed: Yes Yes Improper Length and Hygiene: Yes  Yes Notes pitting edema bilateral lower extremities. Patient advised to make an appointment with PCP ASAP. Electronic Signature(s) Signed: 04/27/2017 4:30:08 PM By: Jordan GurneyWoody, RN, BSN, Kim RN, BSN Entered By: Jordan GurneyWoody, RN, BSN, Hopkins on 04/27/2017 16:30:07 [name], Shamecca J. ([id number]) -------------------------------------------------------------------------------- Multi Wound Chart Details [name], Earlean J. Date of Service: 04/27/2017 3:00 PM Medical Record Patient Account Number: [id number] Number: Treating RN: [name] Mar 24, 1931 ([age] y.o. Other Clinician: Date of Birth/Sex: Female) Treating  Baltazar Najjar Primary Care Christmas Faraci: [name] Anthony Roland/Extender: G Referring Marge Vandermeulen: [name] in Treatment: 11 Vital Signs Height(in): 66 Pulse(bpm): 86 Weight(lbs): 159 Blood Pressure 111/81 (mmHg): Body Mass Index(BMI): 26 Temperature(F): 98.8 Respiratory Rate 16 (breaths/min): Photos: Wound Location: Right, Lateral Malleolus Left, Medial Lower Leg Right, Medial Lower Leg Wounding Event: Gradually Appeared Gradually Appeared Gradually Appeared Primary Etiology: Venous Leg Ulcer Venous Leg Ulcer Venous Leg Ulcer Date Acquired: 01/19/2017 01/19/2017 02/16/2017 Weeks of Treatment: 11 11 10  Wound Status: Open Open Open Measurements L x W x D 2.5x2.1x0.2 3x2.2x0.1 5x6x0.1 (cm) Area (cm) : 4.123 5.184 23.562 Volume (cm) : 0.825 0.518 2.356 % Reduction in Area: 0.40% -1219.10% -14907.60% % Reduction in Volume: -99.30% -1228.20% -14625.00% Classification: Full Thickness Without Full Thickness Without Full Thickness Without Exposed Support Exposed Support Exposed Support Structures Structures Structures Periwound Skin Texture: No Abnormalities Noted No Abnormalities Noted No Abnormalities Noted Periwound Skin No Abnormalities Noted No Abnormalities Noted No Abnormalities Noted Moisture: Periwound Skin Color: No Abnormalities Noted No Abnormalities  Noted No Abnormalities Noted Tenderness on No No No Palpation: Rotz, Avigail J. ([id number]) Treatment Notes Wound #2 (Right, Lateral Malleolus) 1. Cleansed with: Clean wound with Normal Saline 4. Dressing Applied: Santyl Ointment 5. Secondary Dressing Applied ABD Pad Notes kerlix and coban wrapped lightly Wound #3 (Left, Medial Lower Leg) 1. Cleansed with: Clean wound with Normal Saline 4. Dressing Applied: Santyl Ointment 5. Secondary Dressing Applied ABD Pad Notes kerlix and coban wrapped lightly Wound #4 (Right, Medial Lower Leg) 1. Cleansed with: Clean wound with Normal Saline 4. Dressing Applied: Santyl Ointment 5. Secondary Dressing Applied ABD Pad Notes kerlix and coban wrapped lightly Electronic Signature(s) Signed: 04/27/2017 4:47:06 PM By: Baltazar Najjar MD Entered By: Baltazar Najjar on 04/27/2017 16:28:18 CLIFFIE, GINGRAS ([id number]) -------------------------------------------------------------------------------- Multi-Disciplinary Care Plan Details [name], PAYSON. Date of Service: 04/27/2017 3:00 PM Medical Record Patient Account Number: [id number] Number: Treating RN: Jordan Coventry 1931-05-09 (83 y.o. Other Clinician: Date of Birth/Sex: Female) Treating Hopkins, Jordan Primary Care Benino Korinek: [name] Rendon Howell/Extender: G Referring Bhakti Labella: [name] in Treatment: 11 Active Inactive ` Abuse / Safety / Falls / Self Care Management Nursing Diagnoses: Potential for falls Goals: Patient will remain injury free Date Initiated: 02/09/2017 Target Resolution Date: 04/17/2017 Goal Status: Active Interventions: Assess fall risk on admission and as needed Assess self care needs on admission and as needed Notes: ` Nutrition Nursing Diagnoses: Imbalanced nutrition Goals: Patient/caregiver agrees to and verbalizes understanding of need to use nutritional supplements and/or vitamins as prescribed Date Initiated:  02/09/2017 Target Resolution Date: 04/17/2017 Goal Status: Active Interventions: Assess patient nutrition upon admission and as needed per policy Notes: ` Orientation to the Wound Care Program KRYSTOL, ROCCO ([id number]) Nursing Diagnoses: Knowledge deficit related to the wound healing center program Goals: Patient/caregiver will verbalize understanding of the Wound Healing Center Program Date Initiated: 02/09/2017 Target Resolution Date: 02/20/2017 Goal Status: Active Interventions: Provide education on orientation to the wound center Notes: ` Pain, Acute or Chronic Nursing Diagnoses: Pain, acute or chronic: actual or potential Potential alteration in comfort, pain Goals: Patient/caregiver will verbalize adequate pain control between visits Date Initiated: 02/09/2017 Target Resolution Date: 04/17/2017 Goal Status: Active Interventions: Assess comfort goal upon admission Complete pain assessment as per visit requirements Notes: ` Wound/Skin Impairment Nursing Diagnoses: Impaired tissue integrity Knowledge deficit related to smoking impact on wound healing Knowledge deficit related to ulceration/compromised skin integrity Goals: Ulcer/skin breakdown will have a volume reduction of 80% by week 12  Date Initiated: 02/09/2017 Target Resolution Date: 04/10/2017 Goal Status: Active Interventions: Assess patient/caregiver ability to perform ulcer/skin care regimen upon admission and as needed [name], [name] ([id number]) Assess ulceration(s) every visit Notes: Electronic Signature(s) Signed: 04/27/2017 5:19:45 PM By: Jordan Gurney, RN, BSN, Kim RN, BSN Entered By: Jordan Gurney, RN, BSN, Hopkins on 04/27/2017 15:36:30 Roberti, [name] ([id number]) -------------------------------------------------------------------------------- Pain Assessment Details [name], [name]. Date of Service: 04/27/2017 3:00 PM Medical Record Patient Account Number: [id number] Number: Treating RN:  Jordan Coventry 10-Aug-1931 (83 y.o. Other Clinician: Date of Birth/Sex: Female) Treating Hopkins, Jordan Primary Care Staria Birkhead: [name] Ovie Cornelio/Extender: G Referring Pharoah Goggins: [name] in Treatment: 11 Active Problems Location of Pain Severity and Description of Pain Patient Has Paino No Site Locations With Dressing Change: No Pain Management and Medication Current Pain Management: Goals for Pain Management Topical or injectable lidocaine is offered to patient for acute pain when surgical debridement is performed. If needed, Patient is instructed to use over the counter pain medication for the following 24-48 hours after debridement. Wound care MDs do not prescribed pain medications. Patient has chronic pain or uncontrolled pain. Patient has been instructed to make an appointment with their Primary Care Physician for pain management. Electronic Signature(s) Signed: 04/27/2017 5:19:45 PM By: Jordan Gurney, RN, BSN, Kim RN, BSN Entered By: Jordan Gurney, RN, BSN, Hopkins on 04/27/2017 15:03:43 Estupinan, [name] ([id number]) -------------------------------------------------------------------------------- Patient/Caregiver Education Details [name], [name]. Date of Service: 04/27/2017 3:00 PM Medical Record Patient Account Number: [id number] Number: Treating RN: Jordan Coventry May 22, 1931 (83 y.o. Other Clinician: Date of Birth/Gender: Female) Treating Hopkins, Jordan Primary Care Physician: [name] Physician/Extender: G Referring Physician: Gabriel Hopkins in Treatment: 11 Education Assessment Education Provided To: Patient Education Topics Provided Venous: Controlling Swelling with Multilayered Compression Wraps, Other: Make appt with PCP for Handouts: fluid concerns Methods: Demonstration Responses: State content correctly Wound/Skin Impairment: Handouts: Caring for Your Ulcer Methods: Demonstration Responses: State content correctly Electronic  Signature(s) Signed: 04/27/2017 5:19:45 PM By: Jordan Gurney, RN, BSN, Kim RN, BSN Entered By: Jordan Gurney, RN, BSN, Hopkins on 04/27/2017 16:32:23 Stradley, [name] ([id number]) -------------------------------------------------------------------------------- Wound Assessment Details [name] Date of Service: 04/27/2017 3:00 PM Medical Record Patient Account Number: [id number] Number: Treating RN: Jordan Coventry [birth year] (83 y.o. Other Clinician: Date of Birth/Sex: Female) Treating Hopkins, Jordan Primary Care Willia Genrich: [name] Quinita Kostelecky/Extender: G Referring Maneh Sieben: [name] Weeks in Treatment: 11 Wound Status Wound Number: 2 Primary Etiology: Venous Leg Ulcer Wound Location: Right, Lateral Malleolus Wound Status: Open Wounding Event: Gradually Appeared Date Acquired: 01/19/2017 Weeks Of Treatment: 11 Clustered Wound: No Photos Photo Uploaded By: Jordan Gurney, RN, BSN, Hopkins on 04/27/2017 16:27:01 Wound Measurements Length: (cm) 2.5 Width: (cm) 2.1 Depth: (cm) 0.2 Area: (cm) 4.123 Volume: (cm) 0.825 % Reduction in Area: 0.4% % Reduction in Volume: -99.3% Wound Description Full Thickness Without Exposed Classification: Support Structures Periwound Skin Texture Texture Color No Abnormalities Noted: No No Abnormalities Noted: No Moisture No Abnormalities Noted: No Treatment Notes Wound #2 (Right, Lateral Malleolus) Virgo, Zriyah J. ([id number]) 1. Cleansed with: Clean wound with Normal Saline 4. Dressing Applied: Santyl Ointment 5. Secondary Dressing Applied ABD Pad Notes kerlix and coban wrapped lightly Electronic Signature(s) Signed: 04/27/2017 5:19:45 PM By: Jordan Gurney, RN, BSN, Kim RN, BSN Entered By: Jordan Gurney, RN, BSN, Hopkins on 04/27/2017 15:11:29 CAPRICIA, SERDA ([id number]) -------------------------------------------------------------------------------- Wound Assessment Details [name], [name]. Date of Service: 04/27/2017 3:00  PM Medical Record Patient Account Number: [id number] Number: Treating  RN: Jordan Coventry 1931/11/18 (83 y.o. Other Clinician: Date of Birth/Sex: Female) Treating Hopkins, Jordan Primary Care Chesley Valls: [name] Matin Mattioli/Extender: G Referring Anita Laguna: [name] Weeks in Treatment: 11 Wound Status Wound Number: 3 Primary Etiology: Venous Leg Ulcer Wound Location: Left, Medial Lower Leg Wound Status: Open Wounding Event: Gradually Appeared Date Acquired: 01/19/2017 Weeks Of Treatment: 11 Clustered Wound: No Photos Photo Uploaded By: Jordan Gurney, RN, BSN, Hopkins on 04/27/2017 16:27:01 Wound Measurements Length: (cm) 3 Width: (cm) 2.2 Depth: (cm) 0.1 Area: (cm) 5.184 Volume: (cm) 0.518 % Reduction in Area: -1219.1% % Reduction in Volume: -1228.2% Wound Description Full Thickness Without Exposed Classification: Support Structures Periwound Skin Texture Texture Color No Abnormalities Noted: No No Abnormalities Noted: No Moisture No Abnormalities Noted: No Treatment Notes Wound #3 (Left, Medial Lower Leg) Chenevert, Onnika J. ([id number]) 1. Cleansed with: Clean wound with Normal Saline 4. Dressing Applied: Santyl Ointment 5. Secondary Dressing Applied ABD Pad Notes kerlix and coban wrapped lightly Electronic Signature(s) Signed: 04/27/2017 5:19:45 PM By: Jordan Gurney, RN, BSN, Kim RN, BSN Entered By: Jordan Gurney, RN, BSN, Hopkins on 04/27/2017 15:11:30 EMORY, LEAVER ([id number]) -------------------------------------------------------------------------------- Wound Assessment Details [name], [name]. Date of Service: 04/27/2017 3:00 PM Medical Record Patient Account Number: [id number] Number: Treating RN: Jordan Coventry 02-Jun-1931 (83 y.o. Other Clinician: Date of Birth/Sex: Female) Treating Hopkins, Jordan Primary Care Kenyetta Wimbish: [name] Jahlen Bollman/Extender: G Referring Laykin Rainone: [name] Weeks in Treatment: 11 Wound Status Wound Number:  4 Primary Etiology: Venous Leg Ulcer Wound Location: Right, Medial Lower Leg Wound Status: Open Wounding Event: Gradually Appeared Date Acquired: 02/16/2017 Weeks Of Treatment: 10 Clustered Wound: No Photos Photo Uploaded By: Jordan Gurney, RN, BSN, Hopkins on 04/27/2017 16:27:35 Wound Measurements Length: (cm) 5 Width: (cm) 6 Depth: (cm) 0.1 Area: (cm) 23.562 Volume: (cm) 2.356 % Reduction in Area: -14907.6% % Reduction in Volume: -14625% Wound Description Full Thickness Without Exposed Classification: Support Structures Periwound Skin Texture Texture Color No Abnormalities Noted: No No Abnormalities Noted: No Moisture No Abnormalities Noted: No Treatment Notes Wound #4 (Right, Medial Lower Leg) [name], Jordan J. ([id number]) 1. Cleansed with: Clean wound with Normal Saline 4. Dressing Applied: Santyl Ointment 5. Secondary Dressing Applied ABD Pad Notes kerlix and coban wrapped lightly Electronic Signature(s) Signed: 04/27/2017 5:19:45 PM By: Jordan Gurney, RN, BSN, Kim RN, BSN Entered By: Jordan Gurney, RN, BSN, Hopkins on 04/27/2017 15:11:30 [name], [name] ([id number]) -------------------------------------------------------------------------------- Vitals Details [name] Date of Service: 04/27/2017 3:00 PM Medical Record Patient Account Number: [id number] Number: Treating RN: Jordan Coventry [birth year] (83 y.o. Other Clinician: Date of Birth/Sex: Female) Treating Hopkins, Jordan Primary Care Lamarr Feenstra: [name] Maurene Hollin/Extender: G Referring Jeffery Bachmeier: [name] in Treatment: 11 Vital Signs Time Taken: 15:03 Temperature (F): 98.8 Height (in): 66 Pulse (bpm): 86 Weight (lbs): 159 Respiratory Rate (breaths/min): 16 Body Mass Index (BMI): 25.7 Blood Pressure (mmHg): 111/81 Reference Range: 80 - 120 mg / dl Electronic Signature(s) Signed: 04/27/2017 5:19:45 PM By: Jordan Gurney, RN, BSN, Kim RN, BSN Entered By: Jordan Gurney, RN, BSN, Hopkins on 04/27/2017  15:04:07

## 2017-05-04 ENCOUNTER — Ambulatory Visit: Payer: Medicare Other | Admitting: Internal Medicine

## 2017-05-05 ENCOUNTER — Ambulatory Visit: Payer: Medicare Other | Admitting: Internal Medicine

## 2017-05-11 ENCOUNTER — Ambulatory Visit: Payer: Medicare Other | Admitting: Cardiovascular Disease

## 2017-05-12 ENCOUNTER — Encounter: Payer: Self-pay | Admitting: Cardiovascular Disease

## 2017-05-12 ENCOUNTER — Encounter: Payer: Medicare Other | Admitting: Internal Medicine

## 2017-05-12 DIAGNOSIS — I87333 Chronic venous hypertension (idiopathic) with ulcer and inflammation of bilateral lower extremity: Secondary | ICD-10-CM | POA: Diagnosis not present

## 2017-05-13 NOTE — Progress Notes (Signed)
COLEY, LITTLES ([id number]) Visit Report for 05/12/2017 Arrival Information Details [name], [name]. Date of Service: 05/12/2017 1:30 PM Medical Record Patient Account Number: [id number] Number: Treating RN: [name] 1931-08-25 (83 y.o. Other Clinician: Date of Birth/Sex: Female) Treating ROBSON, MICHAEL Primary Care Lura Falor: Guy Begin Aydden Cumpian/Extender: G Referring Uma Jerde: [name] in Treatment: 13 Visit Information History Since Last Visit All ordered tests and consults were completed: No Patient Arrived: Wheel Chair Added or deleted any medications: No Arrival Time: 13:39 Any new allergies or adverse reactions: No Accompanied By: son Had a fall or experienced change in No Transfer Assistance: EasyPivot Patient activities of daily living that may affect Lift risk of falls: Patient Identification Verified: Yes Signs or symptoms of abuse/neglect since last No Secondary Verification Process Yes visito Completed: Hospitalized since last visit: No Patient Requires Transmission- No Has Dressing in Place as Prescribed: Yes Based Precautions: Has Compression in Place as Prescribed: Yes Patient Has Alerts: Yes Pain Present Now: Yes Patient Alerts: Patient on Blood Thinner Electronic Signature(s) Signed: 05/12/2017 5:13:17 PM By: [name] Entered By: [name] on 05/12/2017 13:42:28 Hartline, [name] ([id number]) -------------------------------------------------------------------------------- Encounter Discharge Information Details [name] Date of Service: 05/12/2017 1:30 PM Medical Record Patient Account Number: [id number] Number: Treating RN: [name] September 22, 1931 (83 y.o. Other Clinician: Date of Birth/Sex: Female) Treating ROBSON, MICHAEL Primary Care Anamarie Hunn: Guy Begin Caty Tessler/Extender: G Referring Hilari Wethington: [name] in Treatment: 13 Encounter  Discharge Information Items Discharge Pain Level: 0 Discharge Condition: Stable Ambulatory Status: Wheelchair Discharge Destination: Home Transportation: Private Auto Accompanied By: son Schedule Follow-up Appointment: Yes Medication Reconciliation completed and provided to Patient/Care No Jovi Alvizo: Provided on Clinical Summary of Care: 05/12/2017 Form Type Recipient Paper Patient CF Electronic Signature(s) Signed: 05/12/2017 5:13:17 PM By: [name] Previous Signature: 05/12/2017 2:32:30 PM Version By: Gwenlyn Perking Entered By: [name] on 05/12/2017 15:09:36 Zacarias, [name] ([id number]) -------------------------------------------------------------------------------- Lower Extremity Assessment Details [name] Date of Service: 05/12/2017 1:30 PM Medical Record Patient Account Number: [id number] Number: Treating RN: [name] 01/17/1931 (83 y.o. Other Clinician: Date of Birth/Sex: Female) Treating ROBSON, MICHAEL Primary Care Marja Adderley: Guy Begin Audine Mangione/Extender: G Referring Ayce Pietrzyk: [name] in Treatment: 13 Vascular Assessment Pulses: Dorsalis Pedis Palpable: [Left:No] [Right:No] Doppler Audible: [Left:Yes] Posterior Tibial Extremity colors, hair growth, and conditions: Extremity Color: [Left:Hyperpigmented] [Right:Hyperpigmented] Temperature of Extremity: [Left:Warm] [Right:Warm] Capillary Refill: [Left:> 3 seconds] [Right:> 3 seconds] Toe Nail Assessment Left: Right: Thick: Yes Yes Discolored: Yes Yes Deformed: No No Improper Length and Hygiene: Yes Yes Electronic Signature(s) Signed: 05/12/2017 5:13:17 PM By: [name] Entered By: [name] on 05/12/2017 14:01:47 Vinzant, Andromeda Shela Commons ([id number]) -------------------------------------------------------------------------------- Multi Wound Chart Details [name] Date of Service: 05/12/2017 1:30 PM Medical  Record Patient Account Number: [id number] Number: Treating RN: [name] 1930/12/26 (83 y.o. Other Clinician: Date of Birth/Sex: Female) Treating ROBSON, MICHAEL Primary Care Amen Dargis: Guy Begin Anson Peddie/Extender: G Referring Satcha Storlie: Guy Begin Weeks in Treatment: 13 Vital Signs Height(in): 66 Pulse(bpm): 87 Weight(lbs): 159 Blood Pressure 131/71 (mmHg): Body Mass Index(BMI): 26 Temperature(F): 98.3 Respiratory Rate 16 (breaths/min): Photos: [2:No Photos] [3:No Photos] [4:No Photos] Wound Location: [2:Right Malleolus - Lateral] [3:Left Lower Leg - Medial] [4:Right Lower Leg - Medial] Wounding Event: [2:Gradually Appeared] [3:Gradually Appeared] [4:Gradually Appeared] Primary Etiology: [2:Venous Leg Ulcer] [3:Venous Leg Ulcer] [4:Venous Leg Ulcer] Comorbid History: [2:Cataracts, Arrhythmia, Hypertension, Osteoarthritis] [3:Cataracts, Arrhythmia, Hypertension, Osteoarthritis] [4:Cataracts, Arrhythmia, Hypertension, Osteoarthritis] Date Acquired: [2:01/19/2017] [  3:01/19/2017] [4:02/16/2017] Weeks of Treatment: [2:13] [3:13] [4:12] Wound Status: [2:Open] [3:Open] [4:Open] Measurements L x W x D 3x1.8x0.1 [3:3.5x2.5x0.1] [4:7x5x0.1] (cm) Area (cm) : [2:4.241] [3:6.872] [4:27.489] Volume (cm) : [2:0.424] [3:0.687] [4:2.749] % Reduction in Area: [2:-2.50%] [3:-1648.60%] [4:-17408.90%] % Reduction in Volume: -2.40% [3:-1661.50%] [4:-17081.20%] Classification: [2:Full Thickness Without Exposed Support Structures] [3:Full Thickness Without Exposed Support Structures] [4:Full Thickness Without Exposed Support Structures] Exudate Amount: [2:Large] [3:Large] [4:Large] Exudate Type: [2:Serous] [3:Serous] [4:Serous] Exudate Color: [2:amber] [3:amber] [4:amber] Wound Margin: [2:Distinct, outline attached] [3:Distinct, outline attached] [4:Distinct, outline attached] Granulation Amount: [2:None Present (0%)] [3:None Present (0%)] [4:None Present (0%)] Necrotic  Amount: [2:Large (67-100%)] [3:Large (67-100%)] [4:Large (67-100%)] Epithelialization: [2:None] [3:None] [4:None] Debridement: [2:Debridement (11042- 11047)] [3:Debridement (11042- 11047)] [4:Debridement (11042- 11047)] Pre-procedure 14:04 14:04 14:04 Verification/Time Out Taken: Pain Control: Lidocaine 4% Topical Lidocaine 4% Topical Lidocaine 4% Topical Solution Solution Solution Tissue Debrided: Fibrin/Slough, Exudates, Fibrin/Slough, Exudates, Fibrin/Slough, Exudates, Subcutaneous Subcutaneous Subcutaneous Level: Skin/Subcutaneous Skin/Subcutaneous Skin/Subcutaneous Tissue Tissue Tissue Debridement Area (sq 5.4 8.75 2 cm): Instrument: Curette Curette Curette Bleeding: Minimum Minimum Minimum Hemostasis Achieved: Pressure Pressure Pressure Procedural Pain: 0 0 0 Post Procedural Pain: 0 0 0 Debridement Treatment Procedure was tolerated Procedure was tolerated Procedure was tolerated Response: well well well Post Debridement 3x1.8x0.1 3.5x2.5x0.1 7x5x0.1 Measurements L x W x D (cm) Post Debridement 0.424 0.687 2.749 Volume: (cm) Periwound Skin Texture: No Abnormalities Noted No Abnormalities Noted No Abnormalities Noted Periwound Skin Maceration: Yes Maceration: Yes Maceration: Yes Moisture: Periwound Skin Color: No Abnormalities Noted No Abnormalities Noted No Abnormalities Noted Temperature: No Abnormality No Abnormality No Abnormality Tenderness on Yes Yes Yes Palpation: Wound Preparation: Ulcer Cleansing: Ulcer Cleansing: Ulcer Cleansing: Rinsed/Irrigated with Rinsed/Irrigated with Rinsed/Irrigated with Saline, Other: soap and Saline, Other: soap and Saline, Other: soap and water water water Topical Anesthetic Topical Anesthetic Topical Anesthetic Applied: Other: lidocaine Applied: Other: lidocaine Applied: Other: lidocaine 4% 4% 4% Procedures Performed: Debridement Debridement Debridement Wound Number: 5 N/A N/A Photos: No Photos N/A N/A Wound Location: Left  Lower Leg - Anterior N/A N/A Wounding Event: Gradually Appeared N/A N/A Primary Etiology: Venous Leg Ulcer N/A N/A Comorbid History: Cataracts, Arrhythmia, N/A N/A Hypertension, Osteoarthritis Date Acquired: 05/12/2017 N/A N/A LADELLE, TEODORO ([id number]) Weeks of Treatment: 0 N/A N/A Wound Status: Open N/A N/A Measurements L x W x D 1.5x1x0.1 N/A N/A (cm) Area (cm) : 1.178 N/A N/A Volume (cm) : 0.118 N/A N/A % Reduction in Area: N/A N/A N/A % Reduction in Volume: N/A N/A N/A Classification: Partial Thickness N/A N/A Exudate Amount: Large N/A N/A Exudate Type: Serous N/A N/A Exudate Color: amber N/A N/A Wound Margin: Distinct, outline attached N/A N/A Granulation Amount: None Present (0%) N/A N/A Necrotic Amount: Large (67-100%) N/A N/A Epithelialization: None N/A N/A Debridement: N/A N/A N/A Pain Control: N/A N/A N/A Tissue Debrided: N/A N/A N/A Level: N/A N/A N/A Debridement Area (sq N/A N/A N/A cm): Instrument: N/A N/A N/A Bleeding: N/A N/A N/A Hemostasis Achieved: N/A N/A N/A Procedural Pain: N/A N/A N/A Post Procedural Pain: N/A N/A N/A Debridement Treatment N/A N/A N/A Response: Post Debridement N/A N/A N/A Measurements L x W x D (cm) Post Debridement N/A N/A N/A Volume: (cm) Periwound Skin Texture: No Abnormalities Noted N/A N/A Periwound Skin Maceration: Yes N/A N/A Moisture: Periwound Skin Color: No Abnormalities Noted N/A N/A Temperature: No Abnormality N/A N/A Tenderness on Yes N/A N/A Palpation: Wound Preparation: Ulcer Cleansing: N/A N/A Rinsed/Irrigated with Saline, Other: soap and water Topical Anesthetic Applied: Other: lidocaine 4% Ma, Alfhild  Shela Commons ([id number]) Procedures Performed: N/A N/A N/A Treatment Notes Electronic Signature(s) Signed: 05/12/2017 4:44:14 PM By: Baltazar Najjar MD Entered By: Baltazar Najjar on 05/12/2017 14:24:53 Vigilante, [name]  ([id number]) -------------------------------------------------------------------------------- Multi-Disciplinary Care Plan Details Patient Name: MARLYNE, TOTARO. Date of Service: 05/12/2017 1:30 PM Medical Record Patient Account Number: [id number] Number: Treating RN: [name] Jun 21, 1931 (83 y.o. Other Clinician: Date of Birth/Sex: Female) Treating ROBSON, MICHAEL Primary Care Kolleen Ochsner: Guy Begin Clarise Chacko/Extender: G Referring Samari Bittinger: [name] in Treatment: 13 Active Inactive ` Abuse / Safety / Falls / Self Care Management Nursing Diagnoses: Potential for falls Goals: Patient will remain injury free Date Initiated: 02/09/2017 Target Resolution Date: 04/17/2017 Goal Status: Active Interventions: Assess fall risk on admission and as needed Assess self care needs on admission and as needed Notes: ` Nutrition Nursing Diagnoses: Imbalanced nutrition Goals: Patient/caregiver agrees to and verbalizes understanding of need to use nutritional supplements and/or vitamins as prescribed Date Initiated: 02/09/2017 Target Resolution Date: 04/17/2017 Goal Status: Active Interventions: Assess patient nutrition upon admission and as needed per policy Notes: ` Orientation to the Wound Care Program CITLALIC, NORLANDER ([id number]) Nursing Diagnoses: Knowledge deficit related to the wound healing center program Goals: Patient/caregiver will verbalize understanding of the Wound Healing Center Program Date Initiated: 02/09/2017 Target Resolution Date: 02/20/2017 Goal Status: Active Interventions: Provide education on orientation to the wound center Notes: ` Pain, Acute or Chronic Nursing Diagnoses: Pain, acute or chronic: actual or potential Potential alteration in comfort, pain Goals: Patient/caregiver will verbalize adequate pain control between visits Date Initiated: 02/09/2017 Target Resolution Date: 04/17/2017 Goal Status:  Active Interventions: Assess comfort goal upon admission Complete pain assessment as per visit requirements Notes: ` Wound/Skin Impairment Nursing Diagnoses: Impaired tissue integrity Knowledge deficit related to smoking impact on wound healing Knowledge deficit related to ulceration/compromised skin integrity Goals: Ulcer/skin breakdown will have a volume reduction of 80% by week 12 Date Initiated: 02/09/2017 Target Resolution Date: 04/10/2017 Goal Status: Active Interventions: Assess patient/caregiver ability to perform ulcer/skin care regimen upon admission and as needed NAZARETH, NORENBERG ([id number]) Assess ulceration(s) every visit Notes: Electronic Signature(s) Signed: 05/12/2017 5:13:17 PM By: [name] Entered By: [name] on 05/12/2017 14:02:20 Leasure, [name] ([id number]) -------------------------------------------------------------------------------- Pain Assessment Details [name] Date of Service: 05/12/2017 1:30 PM Medical Record Patient Account Number: [id number] Number: Treating RN: [name] Jan 25, 1931 (83 y.o. Other Clinician: Date of Birth/Sex: Female) Treating ROBSON, MICHAEL Primary Care Ganesh Deeg: Guy Begin Alexas Basulto/Extender: G Referring Kimimila Tauzin: [name] in Treatment: 13 Active Problems Location of Pain Severity and Description of Pain Patient Has Paino Yes Site Locations Pain Location: Pain in Ulcers With Dressing Change: Yes Rate the pain. Current Pain Level: 4 Character of Pain Describe the Pain: Tender, Throbbing Pain Management and Medication Current Pain Management: Electronic Signature(s) Signed: 05/12/2017 5:13:17 PM By: [name] Entered By: [name] on 05/12/2017 13:42:47 Earll, [name] ([id number]) -------------------------------------------------------------------------------- Patient/Caregiver Education Details [name] Date  of Service: 05/12/2017 1:30 PM Medical Record Patient Account Number: [id number] Number: Treating RN: [name] 02/22/1931 (83 y.o. Other Clinician: Date of Birth/Gender: Female) Treating ROBSON, MICHAEL Primary Care Physician: Guy Begin Physician/Extender: G Referring Physician: [name] in Treatment: 13 Education Assessment Education Provided To: Patient Education Topics Provided Wound/Skin Impairment: Handouts: Other: change dressing as ordered Methods: Demonstration, Explain/Verbal Responses: State content correctly Electronic Signature(s) Signed: 05/12/2017 5:13:17 PM By: [name] Entered By: [name] on 05/12/2017 15:09:50 Humann, Neidra J. ([id number]) --------------------------------------------------------------------------------  Wound Assessment Details Patient Name: BRIZEIDA, MCMURRY. Date of Service: 05/12/2017 1:30 PM Medical Record Patient Account Number: [id number] Number: Treating RN: [name] 05/31/1931 (83 y.o. Other Clinician: Date of Birth/Sex: Female) Treating ROBSON, MICHAEL Primary Care Ryenn Howeth: Guy Begin Lauraann Missey/Extender: G Referring Gyneth Hubka: Guy Begin Weeks in Treatment: 13 Wound Status Wound Number: 2 Primary Venous Leg Ulcer Etiology: Wound Location: Right Malleolus - Lateral Wound Status: Open Wounding Event: Gradually Appeared Comorbid Cataracts, Arrhythmia, Hypertension, Date Acquired: 01/19/2017 History: Osteoarthritis Weeks Of Treatment: 13 Clustered Wound: No Photos Photo Uploaded By: [name] on 05/12/2017 17:08:15 Wound Measurements Length: (cm) 3 Width: (cm) 1.8 Depth: (cm) 0.1 Area: (cm) 4.241 Volume: (cm) 0.424 % Reduction in Area: -2.5% % Reduction in Volume: -2.4% Epithelialization: None Tunneling: No Undermining: No Wound Description Full Thickness Without Exposed Classification: Support Structures Wound Margin: Distinct, outline  attached Exudate Large Amount: Exudate Type: Serous Exudate Color: amber Foul Odor After Cleansing: No Slough/Fibrino Yes Wound Bed Granulation Amount: None Present (0%) Schaad, Jamoni J. ([id number]) Necrotic Amount: Large (67-100%) Necrotic Quality: Adherent Slough Periwound Skin Texture Texture Color No Abnormalities Noted: No No Abnormalities Noted: No Moisture Temperature / Pain No Abnormalities Noted: No Temperature: No Abnormality Maceration: Yes Tenderness on Palpation: Yes Wound Preparation Ulcer Cleansing: Rinsed/Irrigated with Saline, Other: soap and water, Topical Anesthetic Applied: Other: lidocaine 4%, Treatment Notes Wound #2 (Right, Lateral Malleolus) 1. Cleansed with: Clean wound with Normal Saline Cleanse wound with antibacterial soap and water 2. Anesthetic Topical Lidocaine 4% cream to wound bed prior to debridement 4. Dressing Applied: Hydrafera Blue 5. Secondary Dressing Applied ABD Pad 7. Secured with Tape Notes kerlix, coban, unna to Ecologist) Signed: 05/12/2017 5:13:17 PM By: [name] Entered By: [name] on 05/12/2017 13:55:07 Gal, [name] ([id number]) -------------------------------------------------------------------------------- Wound Assessment Details [name] Date of Service: 05/12/2017 1:30 PM Medical Record Patient Account Number: [id number] Number: Treating RN: [name] 1931-03-14 (83 y.o. Other Clinician: Date of Birth/Sex: Female) Treating ROBSON, MICHAEL Primary Care Aleksey Newbern: Guy Begin Chayla Shands/Extender: G Referring Leinani Lisbon: Guy Begin Weeks in Treatment: 13 Wound Status Wound Number: 3 Primary Venous Leg Ulcer Etiology: Wound Location: Left Lower Leg - Medial Wound Status: Open Wounding Event: Gradually Appeared Comorbid Cataracts, Arrhythmia, Hypertension, Date Acquired: 01/19/2017 History: Osteoarthritis Weeks Of Treatment:  13 Clustered Wound: No Photos Photo Uploaded By: [name] on 05/12/2017 17:08:16 Wound Measurements Length: (cm) 3.5 Width: (cm) 2.5 Depth: (cm) 0.1 Area: (cm) 6.872 Volume: (cm) 0.687 % Reduction in Area: -1648.6% % Reduction in Volume: -1661.5% Epithelialization: None Tunneling: No Undermining: No Wound Description Full Thickness Without Exposed Classification: Support Structures Wound Margin: Distinct, outline attached Exudate Large Amount: Exudate Type: Serous Exudate Color: amber Foul Odor After Cleansing: No Slough/Fibrino Yes Wound Bed Granulation Amount: None Present (0%) Brunson, Kirin J. ([id number]) Necrotic Amount: Large (67-100%) Necrotic Quality: Adherent Slough Periwound Skin Texture Texture Color No Abnormalities Noted: No No Abnormalities Noted: No Moisture Temperature / Pain No Abnormalities Noted: No Temperature: No Abnormality Maceration: Yes Tenderness on Palpation: Yes Wound Preparation Ulcer Cleansing: Rinsed/Irrigated with Saline, Other: soap and water, Topical Anesthetic Applied: Other: lidocaine 4%, Treatment Notes Wound #3 (Left, Medial Lower Leg) 1. Cleansed with: Clean wound with Normal Saline Cleanse wound with antibacterial soap and water 2. Anesthetic Topical Lidocaine 4% cream to wound bed prior to debridement 4. Dressing Applied: Hydrafera Blue 5. Secondary Dressing Applied ABD Pad 7. Secured with Tape Notes kerlix, coban, unna to Ecologist) Signed: 05/12/2017 5:13:17  PM By: [name] Entered By: [name] on 05/12/2017 13:53:46 Renz, [name] ([id number]) -------------------------------------------------------------------------------- Wound Assessment Details Patient Name: TYKERIA, WAWRZYNIAK. Date of Service: 05/12/2017 1:30 PM Medical Record Patient Account Number: [id number] Number: Treating RN: [name] February 01, 1931 (83 y.o. Other Clinician: Date of  Birth/Sex: Female) Treating ROBSON, MICHAEL Primary Care Steffen Hase: Guy Begin Kiernan Farkas/Extender: G Referring Alonie Gazzola: Guy Begin Weeks in Treatment: 13 Wound Status Wound Number: 4 Primary Venous Leg Ulcer Etiology: Wound Location: Right Lower Leg - Medial Wound Status: Open Wounding Event: Gradually Appeared Comorbid Cataracts, Arrhythmia, Hypertension, Date Acquired: 02/16/2017 History: Osteoarthritis Weeks Of Treatment: 12 Clustered Wound: No Photos Photo Uploaded By: [name] on 05/12/2017 17:08:55 Wound Measurements Length: (cm) 7 Width: (cm) 5 Depth: (cm) 0.1 Area: (cm) 27.489 Volume: (cm) 2.749 % Reduction in Area: -17408.9% % Reduction in Volume: -17081.2% Epithelialization: None Tunneling: No Undermining: No Wound Description Full Thickness Without Exposed Classification: Support Structures Wound Margin: Distinct, outline attached Exudate Large Amount: Exudate Type: Serous Exudate Color: amber Foul Odor After Cleansing: No Slough/Fibrino Yes Wound Bed Granulation Amount: None Present (0%) Mones, Aamari J. ([id number]) Necrotic Amount: Large (67-100%) Necrotic Quality: Adherent Slough Periwound Skin Texture Texture Color No Abnormalities Noted: No No Abnormalities Noted: No Moisture Temperature / Pain No Abnormalities Noted: No Temperature: No Abnormality Maceration: Yes Tenderness on Palpation: Yes Wound Preparation Ulcer Cleansing: Rinsed/Irrigated with Saline, Other: soap and water, Topical Anesthetic Applied: Other: lidocaine 4%, Treatment Notes Wound #4 (Right, Medial Lower Leg) 1. Cleansed with: Clean wound with Normal Saline Cleanse wound with antibacterial soap and water 2. Anesthetic Topical Lidocaine 4% cream to wound bed prior to debridement 4. Dressing Applied: Hydrafera Blue 5. Secondary Dressing Applied ABD Pad 7. Secured with Tape Notes kerlix, coban, unna to Ecologist) Signed:  05/12/2017 5:13:17 PM By: [name] Entered By: [name] on 05/12/2017 13:55:59 Egolf, [name] ([id number]) -------------------------------------------------------------------------------- Wound Assessment Details [name] Date of Service: 05/12/2017 1:30 PM Medical Record Patient Account Number: [id number] Number: Treating RN: [name] [birth year] (83 y.o. Other Clinician: Date of Birth/Sex: Female) Treating ROBSON, MICHAEL Primary Care Qiara Minetti: Guy Begin Tadd Holtmeyer/Extender: G Referring Jaelyn Cloninger: Guy Begin Weeks in Treatment: 13 Wound Status Wound Number: 5 Primary Venous Leg Ulcer Etiology: Wound Location: Left Lower Leg - Anterior Wound Status: Open Wounding Event: Gradually Appeared Comorbid Cataracts, Arrhythmia, Hypertension, Date Acquired: 05/12/2017 History: Osteoarthritis Weeks Of Treatment: 0 Clustered Wound: No Wound Measurements Length: (cm) 1.5 Width: (cm) 1 Depth: (cm) 0.1 Area: (cm) 1.178 Volume: (cm) 0.118 % Reduction in Area: % Reduction in Volume: Epithelialization: None Tunneling: No Undermining: No Wound Description Classification: Partial Thickness Wound Margin: Distinct, outline attached Exudate Amount: Large Exudate Type: Serous Exudate Color: amber Foul Odor After Cleansing: No Slough/Fibrino Yes Wound Bed Granulation Amount: None Present (0%) Necrotic Amount: Large (67-100%) Necrotic Quality: Adherent Slough Periwound Skin Texture Texture Color No Abnormalities Noted: No No Abnormalities Noted: No Moisture Temperature / Pain No Abnormalities Noted: No Temperature: No Abnormality Maceration: Yes Tenderness on Palpation: Yes Wound Preparation Ulcer Cleansing: Rinsed/Irrigated with Saline, Other: soap and water, Topical Anesthetic Applied: Other: lidocaine 4%, Hase, Lovenia J. ([id number]) Treatment Notes Wound #5 (Left, Anterior Lower Leg) 1. Cleansed with: Clean  wound with Normal Saline Cleanse wound with antibacterial soap and water 2. Anesthetic Topical Lidocaine 4% cream to wound bed prior to debridement 4. Dressing Applied: Hydrafera Blue 5. Secondary Dressing Applied ABD Pad 7. Secured with Tape Notes kerlix, coban, unna to Sport and exercise psychologist  Signature(s) Signed: 05/12/2017 5:13:17 PM By: Alejandro MullingPinkerton, Debra Entered By: Alejandro MullingPinkerton, Debra on 05/12/2017 13:59:05 Legere, [name] ([id number]) -------------------------------------------------------------------------------- Vitals Details Patient Name: Jordan BandaFULLER, Maeby J. Date of Service: 05/12/2017 1:30 PM Medical Record Patient Account Number: [id number] Number: Treating RN: Phillis Haggisinkerton, Debi 10/31/1931 (83 y.o. Other Clinician: Date of Birth/Sex: Female) Treating ROBSON, MICHAEL Primary Care Freya Zobrist: [name], NARGES Arlayne Liggins/Extender: G Referring Tamala Manzer: Gabriel RungFARAHI, NARGES Weeks in Treatment: 13 Vital Signs Time Taken: 13:42 Temperature (F): 98.3 Height (in): 66 Pulse (bpm): 87 Weight (lbs): 159 Respiratory Rate (breaths/min): 16 Body Mass Index (BMI): 25.7 Blood Pressure (mmHg): 131/71 Reference Range: 80 - 120 mg / dl Electronic Signature(s) Signed: 05/12/2017 5:13:17 PM By: Alejandro MullingPinkerton, Debra Entered By: Alejandro MullingPinkerton, Debra on 05/12/2017 13:44:45

## 2017-05-13 NOTE — Progress Notes (Signed)
JOYEL, CHENETTE ([id number]) Visit Report for 05/12/2017 Chief Complaint Document Details [name], [name]. Date of Service: 05/12/2017 1:30 PM Medical Record Patient Account Number: [id number] Number: Treating RN: [name] July 25, 1931 (83 y.o. Other Clinician: Date of Birth/Sex: Female) Treating ROBSON, MICHAEL Primary Care Provider: Guy Begin Provider/Extender: G Referring Provider: [name] in Treatment: 13 Information Obtained from: Patient Chief Complaint the patient is here for follow-up evaluation of her bilateral lower extremity ulcers Electronic Signature(s) Signed: 05/12/2017 4:44:14 PM By: Baltazar Najjar MD Entered By: Baltazar Najjar on 05/12/2017 14:25:39 Kissler, [name] ([id number]) -------------------------------------------------------------------------------- Debridement Details [name] Date of Service: 05/12/2017 1:30 PM Medical Record Patient Account Number: [id number] Number: Treating RN: [name] Dec 19, 1930 (83 y.o. Other Clinician: Date of Birth/Sex: Female) Treating ROBSON, MICHAEL Primary Care Provider: Guy Begin Provider/Extender: G Referring Provider: [name] in Treatment: 13 Debridement Performed for Wound #2 Right,Lateral Malleolus Assessment: Performed By: Physician Maxwell Caul, MD Debridement: Debridement Severity of Tissue Pre Fat layer exposed Debridement: Pre-procedure Verification/Time Out Yes - 14:04 Taken: Start Time: 14:05 Pain Control: Lidocaine 4% Topical Solution Level: Skin/Subcutaneous Tissue Total Area Debrided (L x 3 (cm) x 1.8 (cm) = 5.4 (cm) W): Tissue and other Viable, Non-Viable, Exudate, Fibrin/Slough, Subcutaneous material debrided: Instrument: Curette Bleeding: Minimum Hemostasis Achieved: Pressure End Time: 14:07 Procedural Pain: 0 Post Procedural Pain: 0 Response to Treatment: Procedure was  tolerated well Post Debridement Measurements of Total Wound Length: (cm) 3 Width: (cm) 1.8 Depth: (cm) 0.1 Volume: (cm) 0.424 Character of Wound/Ulcer Post Requires Further Debridement Debridement: Severity of Tissue Post Debridement: Fat layer exposed Post Procedure Diagnosis Same as Pre-procedure Electronic Signature(s) ANNETH, BRUNELL ([id number]) Signed: 05/12/2017 4:44:14 PM By: Baltazar Najjar MD Signed: 05/12/2017 5:13:17 PM By: [name] Entered By: Baltazar Najjar on 05/12/2017 14:25:05 Lindner, [name] ([id number]) -------------------------------------------------------------------------------- Debridement Details [name] Date of Service: 05/12/2017 1:30 PM Medical Record Patient Account Number: [id number] Number: Treating RN: [name] 17-Sep-1931 (83 y.o. Other Clinician: Date of Birth/Sex: Female) Treating ROBSON, MICHAEL Primary Care Provider: Guy Begin Provider/Extender: G Referring Provider: [name] in Treatment: 13 Debridement Performed for Wound #3 Left,Medial Lower Leg Assessment: Performed By: Physician Maxwell Caul, MD Debridement: Debridement Severity of Tissue Pre Fat layer exposed Debridement: Pre-procedure Verification/Time Out Yes - 14:04 Taken: Start Time: 14:07 Pain Control: Lidocaine 4% Topical Solution Level: Skin/Subcutaneous Tissue Total Area Debrided (L x 3.5 (cm) x 2.5 (cm) = 8.75 (cm) W): Tissue and other Viable, Non-Viable, Exudate, Fibrin/Slough, Subcutaneous material debrided: Instrument: Curette Bleeding: Minimum Hemostasis Achieved: Pressure End Time: 14:08 Procedural Pain: 0 Post Procedural Pain: 0 Response to Treatment: Procedure was tolerated well Post Debridement Measurements of Total Wound Length: (cm) 3.5 Width: (cm) 2.5 Depth: (cm) 0.1 Volume: (cm) 0.687 Character of Wound/Ulcer Post Requires Further Debridement Debridement: Severity of  Tissue Post Debridement: Fat layer exposed Post Procedure Diagnosis Same as Pre-procedure Electronic Signature(s) BRENDA, SAMANO ([id number]) Signed: 05/12/2017 4:44:14 PM By: Baltazar Najjar MD Signed: 05/12/2017 5:13:17 PM By: [name] Entered By: Baltazar Najjar on 05/12/2017 14:25:17 Yardley, [name] ([id number]) -------------------------------------------------------------------------------- Debridement Details [name] Date of Service: 05/12/2017 1:30 PM Medical Record Patient Account Number: [id number] Number: Treating RN: [name] [birth year] (83 y.o. Other Clinician: Date of Birth/Sex: Female) Treating ROBSON, MICHAEL Primary Care Provider: Guy Begin Provider/Extender: G Referring Provider: Guy Begin Weeks in Treatment: 13 Debridement Performed for Wound #4 Right,Medial Lower Leg  Assessment: Performed By: Physician Maxwell Caul, MD Debridement: Debridement Severity of Tissue Pre Fat layer exposed Debridement: Pre-procedure Verification/Time Out Yes - 14:04 Taken: Start Time: 14:08 Pain Control: Lidocaine 4% Topical Solution Level: Skin/Subcutaneous Tissue Total Area Debrided (L x 1 (cm) x 2 (cm) = 2 (cm) W): Tissue and other Viable, Non-Viable, Exudate, Fibrin/Slough, Subcutaneous material debrided: Instrument: Curette Bleeding: Minimum Hemostasis Achieved: Pressure End Time: 14:10 Procedural Pain: 0 Post Procedural Pain: 0 Response to Treatment: Procedure was tolerated well Post Debridement Measurements of Total Wound Length: (cm) 7 Width: (cm) 5 Depth: (cm) 0.1 Volume: (cm) 2.749 Character of Wound/Ulcer Post Requires Further Debridement Debridement: Severity of Tissue Post Debridement: Fat layer exposed Post Procedure Diagnosis Same as Pre-procedure Electronic Signature(s) ESTI, DEMELLO ([id number]) Signed: 05/12/2017 4:44:14 PM By: Baltazar Najjar MD Signed: 05/12/2017 5:13:17 PM  By: [name] Entered By: Baltazar Najjar on 05/12/2017 14:25:29 Whitesel, [name] ([id number]) -------------------------------------------------------------------------------- HPI Details [name] Date of Service: 05/12/2017 1:30 PM Medical Record Patient Account Number: [id number] Number: Treating RN: [name] [birth year] (83 y.o. Other Clinician: Date of Birth/Sex: Female) Treating ROBSON, MICHAEL Primary Care Provider: Guy Begin Provider/Extender: G Referring Provider: Guy Begin Weeks in Treatment: 13 History of Present Illness HPI Description: 06/10/16; this is an 83 year old woman who lives near [place] with family members. She saw her primary doctor roughly over a week ago and was referred to the ER for a cellulitis in the right medial leg. She was given a round of IV antibiotics in the emergency room and discharged on oral Keflex which she is taking. She is been left with a uncomfortable leg area on the right medial lower leg. Her ABIs in this clinic were 0.8 on the right 0.6 on the left. She does not describe claudication. She is not a diabetic. Lab work in the ER showed a normal BUN and creatinine on 6/20 at 18 and 0.92 respectively white count was 10.1 hemoglobin at 13.8. She did not have any imaging studies. I can see no vascular evaluations in Epic. She does not have a prior wound history 06/17/16; the area affected on her right medial ankle is an area I thought was due to tissue damage from cellulitis last week. She is going for venous reflux studies this afternoon, these were not ordered in this clinic at least not by me. The patient does not give a prior history of damage to the skin in this area although I wonder if she actually might not of noticed it. We have been using's Aquacel Ag under a wrap although we will not be able to wrap her today 06/24/16: pt reports today she hasn't heard from the vascular clinic regarding  proceeding with arterial studies. our office will assist with this today. she denies s/s of infection. denies problems with her wraps. 07/07/16; since I last saw this woman she was admitted to hospital from 7/16 through 7/18 with sepsis syndrome felt to be secondary to her ulcer. She was treated with Vanco and Zosyn the hospital discharged on Septra. Blood cultures were negative. Urine culture showed multiple species she is now feeling well using Santyl with border foam to her wound. She has [facility] 07/14/16; no major improvement here. She has a quarter-sized wound covered with thick adherent slough on the lateral aspect of her right leg with several surrounding satellite lesions in a similar state. She has had 2 recent bouts of cellulitis, one before she came to this clinic and one  required a recent hospitalization. I have asked for arterial studies I don't believe these have ever been done. My notes suggest from early in July that she went for reflux studies although I don't know that I have ever seen these either, she did have a DVT rule out but I did not order this, this did not show a DVT but did show a Baker's cyst 07/21/2016 -- the patient had an arterial duplex study done with waveform suggestive of right femoropopliteal disease. There is also small vessel disease bilaterally. Right ABI was in the normal range of 1.0 and a TBI was 0.74. Left ABI is abnormal at 0.69 with a TBI of 0.40. Three-vessel runoff on the right and a two-vessel runoff on the left. She has an appointment to see Dr. Kirke Corin later today. 07/28/2016 -- was seen by Dr. Kirke Corin who reviewed her arterial studies with an ABI of 0.94 on the right and 0.69 on the left with normal right toe brachial index. Duplex showed diffuse atherosclerosis without discrete Shew, Mykiah J. ([id number]) stenosis and there was a three-vessel runoff on the right below the knee. Based on these studies and the location of the ulcer he  thought it was venous commended she continue with wound care consults. 08/04/16; I note the patient's reasonably functional arterial supply which is fortunate. The wound bed appears to be smaller. Debrided of surface slough. She is been using Iodoflex 08/11/16: Only a very small open area remains. However it has some depth 08/18/16; small open area is smaller. Still open however. 08/25/16; the area has totally closed down and epithelialized. READMISSION 02/09/17; this is an 83 year old woman who is not a diabetic or smoker. She was in our clinic in the summer into mid-September with a wound on her right medial calf that. This eventually closed down. I thought this was mostly venous. She did have evidence of PAD and she was seen by Dr. Kirke Corin. At that point she had an ABI of 0.94 on the right 0.69 on the left with a normal right toe brachial index. It was felt that she had enough blood flow to close her wound and that happened. She was discharged with stockings although I don't get the sense that she really use them she tells me that roughly a week or 2 she developed swelling in her bilateral lower legs and then noted wounds on her right lateral malleolus and left medial calf. The on this she is not really sure how this happened. She is not having a lot of pain although she is not ambulatory that much only in her home. Other than weight loss she has not noticed any other issues no specific claudication no chest pain. 02/16/17; the patient continues with 2 wounds on the right lateral malleolus and left medial calf. Both of these are painful. She has chronic venous insufficiency but also known PAD and is previously seen Dr. Kirke Corin. We have arranged vascular follow-up with Dr. Kirke Corin to look at the arterial situation 02/25/2017 -- patient was brought in today because she missed her appointment last week with Dr. Leanord Hawking and is having a lot of pain and drainage from her wounds. The interim she was seen by Dr.  Bascom Levels who did a duplex of the right lower extremity which shows a right ABI of 0.95 and left ABI 0.74. There was also a abdominal aortogram with a bilateral extremity angiography done for significant peripheral vascular disease. The right lower extremity showed diffuse SFA and popliteal disease with significant stenosis  affecting the TP trunk just before the origin of the peritoneal artery. The left lower extremity showed moderate left SFA stenosis with occluded popliteal artery and tibioperoneal vessels with only visible collateral. The opinion was the patient had no endovascular surgical revascularization options of the left lower extremity and Dr. Bascom Levels would discuss it with Dr. Randie Heinz for a second opinion. Endovascular intervention on the right TP trunk could be considered. 03/03/17; the patient arrives back today having last seen Dr. Meyer Russel. As noted she has severe PAD which is not amenable to either bypass surgery or endovascular interventions on the left. Endovascular interventions in the right TP trunk could be considered. She has large wounds on the right medial leg and a sizable wound on the right lateral leg. Small wound on the left medial leg. She is really in a lot of discomfort. Using Santyl on the right and I believe Prisma on the left. 03/10/17; the patient arrives today again with large wounds on the right lateral lower extremity, right medial lower extremity. And a small punched out wound on the left medial lower extremity. These are in the setting of severe non-revascularizable PAD. She continues to complain of discomfort although I don't think right now this is unmanageable. We have been using Santyl. We are arranging getting her home health [[facility]] 03/16/17- patient is here for follow-up evaluation of her bilateral lower extremity ulcers. She has an appointment on 4/11 for an arthrectomy per Dr. Kirke Corin at [facility]. She is continuing to [place]. Her son is doing her  dressing changes 03/30/17 still using santyl. sincer her last visit she was admitted from 4/11-4/12 for arthrectomy and balloon angioplasty of the right distal popliteal artery with TP trunk and peritoneal arteries. she is on dual antiplatelet [name], [name]. ([id number]) and states her pain is somewhat better on the right 04/13/17 still using santyl to all wounds. Area on the right medial and left lateral look better. Still difficulties with the right lateral wouind. Combination of arterial insufficiency and Venous stasis No revascularization options on the left. Possible interventions on the right (see above) 04/20/17; the patient is still using Santyl to all wound areas. We've had some improvement in the right medial and left lateral. Patient has a combination of arterial and venous insufficiency. No revascularization options are available to the patient on the left however possible interventions on the right. 04/27/17; I continued using Santyl to all wound areas. We've had improvement in the right medial wound and the left lateral wound. The right lateral lower leg wound has remained static. She arrives today with a lot more edema in her bilateral legs this is pitting. She does not complain of chest pain or shortness of breath tells me she has no prior cardiac issues. 05/12/17; I been using Santyl to all of this lady's wounds. She has an appointment with Dr. Kirke Corin next week. The other issue this week is that apparently her insurance and [facility] are no longer under contract though there was some suggestion that there is another home health company, we can't really determine who that was today. We change the primary dressing to all the wounds to [facility] Electronic Signature(s) Signed: 05/12/2017 4:44:14 PM By: Baltazar Najjar MD Entered By: Baltazar Najjar on 05/12/2017 14:27:32 Scalf, [name]  ([id number]) -------------------------------------------------------------------------------- Physical Exam Details Patient Name: JAMELLE, NOY. Date of Service: 05/12/2017 1:30 PM Medical Record Patient Account Number: [id number] Number: Treating RN: [name] 1931/02/13 (83 y.o. Other Clinician: Date of Birth/Sex:  Female) Treating Leanord Hawking, MICHAEL Primary Care Provider: Guy Begin Provider/Extender: G Referring Provider: Guy Begin Weeks in Treatment: 13 Constitutional Sitting or standing Blood Pressure is within target range for patient.. Pulse regular and within target range for patient.Marland Kitchen Respirations regular, non-labored and within target range.. Temperature is normal and within the target range for the patient.Marland Kitchen appears in no distress. Cardiovascular Pedal pulses absent bilaterally.. Notes When exam; the area on her right medial leg and the left medial leg looks stable. Covered in necrotic eschar, however the size of the wounds appears to be stable. oThe area on the right lateral ankle area also has surface slough. oNew area on the left anterior leg superficial oAll wounds debrided with a #3 curet. There is bleeding. Hemostasis with direct pressure Electronic Signature(s) Signed: 05/12/2017 4:44:14 PM By: Baltazar Najjar MD Entered By: Baltazar Najjar on 05/12/2017 14:28:54 [name] ([id number]) -------------------------------------------------------------------------------- Physician Orders Details [name] Date of Service: 05/12/2017 1:30 PM Medical Record Patient Account Number: [id number] Number: Treating RN: [name] 1931/11/07 (83 y.o. Other Clinician: Date of Birth/Sex: Female) Treating ROBSON, MICHAEL Primary Care Provider: Guy Begin Provider/Extender: G Referring Provider: [name] in Treatment: 33 Verbal / Phone Orders: Yes Clinician: Ashok Cordia, Debi Read Back and Verified:  Yes Diagnosis Coding Wound Cleansing Wound #2 Right,Lateral Malleolus o Clean wound with Normal Saline. o Cleanse wound with mild soap and water Wound #5 Left,Anterior Lower Leg o Clean wound with Normal Saline. o Cleanse wound with mild soap and water Wound #3 Left,Medial Lower Leg o Clean wound with Normal Saline. o Cleanse wound with mild soap and water Wound #4 Right,Medial Lower Leg o Clean wound with Normal Saline. o Cleanse wound with mild soap and water Anesthetic Wound #2 Right,Lateral Malleolus o Topical Lidocaine 4% cream applied to wound bed prior to debridement - clinic use only Wound #5 Left,Anterior Lower Leg o Topical Lidocaine 4% cream applied to wound bed prior to debridement - clinic use only Wound #3 Left,Medial Lower Leg o Topical Lidocaine 4% cream applied to wound bed prior to debridement - clinic use only Wound #4 Right,Medial Lower Leg o Topical Lidocaine 4% cream applied to wound bed prior to debridement - clinic use only Primary Wound Dressing Wound #2 Right,Lateral Malleolus o Hydrafera Blue Wound #5 Left,Anterior Lower Leg [name], Jordan Maddelynn J. ([id number]) o Hydrafera Blue Wound #3 Left,Medial Lower Leg o Hydrafera Blue Wound #4 Right,Medial Lower Leg o Hydrafera Blue Secondary Dressing Wound #2 Right,Lateral Malleolus o ABD pad Wound #3 Left,Medial Lower Leg o ABD pad Wound #5 Left,Anterior Lower Leg o ABD pad Wound #4 Right,Medial Lower Leg o ABD pad Dressing Change Frequency Wound #2 Right,Lateral Malleolus o Change dressing every week Wound #5 Left,Anterior Lower Leg o Change dressing every week Wound #3 Left,Medial Lower Leg o Change dressing every week Wound #4 Right,Medial Lower Leg o Change dressing every week Follow-up Appointments Wound #2 Right,Lateral Malleolus o Return Appointment in 1 week. Wound #5 Left,Anterior Lower Leg o Return Appointment in 1 week. Wound #3  Left,Medial Lower Leg o Return Appointment in 1 week. Wound #4 Right,Medial Lower Leg o Return Appointment in 1 week. Edema Control [name], [name]. ([id number]) Wound #2 Right,Lateral Malleolus o Kerlix and Coban - Bilateral - wrap lightly unna to anchor o Elevate legs to the level of the heart and pump ankles as often as possible Wound #5 Left,Anterior Lower Leg o Kerlix and Coban - Bilateral - wrap lightly unna to anchor o Elevate legs  to the level of the heart and pump ankles as often as possible Wound #3 Left,Medial Lower Leg o Kerlix and Coban - Bilateral - wrap lightly unna to anchor o Elevate legs to the level of the heart and pump ankles as often as possible Wound #4 Right,Medial Lower Leg o Kerlix and Coban - Bilateral - wrap lightly unna to anchor o Elevate legs to the level of the heart and pump ankles as often as possible Additional Orders / Instructions Wound #2 Right,Lateral Malleolus o Increase protein intake. Wound #5 Left,Anterior Lower Leg o Increase protein intake. Wound #3 Left,Medial Lower Leg o Increase protein intake. Wound #4 Right,Medial Lower Leg o Increase protein intake. Home Health Wound #2 Right,Lateral Malleolus o Continue Home Health Visits o Home Health Nurse may visit PRN to address patientos wound care needs. o FACE TO FACE ENCOUNTER: MEDICARE and MEDICAID PATIENTS: I certify that this patient is under my care and that I had a face-to-face encounter that meets the physician face-to-face encounter requirements with this patient on this date. The encounter with the patient was in whole or in part for the following MEDICAL CONDITION: (primary reason for Home Healthcare) MEDICAL NECESSITY: I certify, that based on my findings, NURSING services are a medically necessary home health service. HOME BOUND STATUS: I certify that my clinical findings support that this patient is homebound (i.e., Due to illness or  injury, pt requires aid of supportive devices such as crutches, cane, wheelchairs, walkers, the use of special transportation or the assistance of another person to leave their place of residence. There is a normal inability to leave the home and doing so requires considerable and taxing effort. Other absences are for medical reasons / religious services and are infrequent or of short duration when for other reasons). [name], [name] ([id number]) o If current dressing causes regression in wound condition, may D/C ordered dressing product/s and apply Normal Saline Moist Dressing daily until next Wound Healing Center / Other MD appointment. Notify Wound Healing Center of regression in wound condition at [phone]. o Please direct any NON-WOUND related issues/requests for orders to patient's Primary Care Physician Wound #5 Left,Anterior Lower Leg o Continue Home Health Visits o Home Health Nurse may visit PRN to address patientos wound care needs. o FACE TO FACE ENCOUNTER: MEDICARE and MEDICAID PATIENTS: I certify that this patient is under my care and that I had a face-to-face encounter that meets the physician face-to-face encounter requirements with this patient on this date. The encounter with the patient was in whole or in part for the following MEDICAL CONDITION: (primary reason for Home Healthcare) MEDICAL NECESSITY: I certify, that based on my findings, NURSING services are a medically necessary home health service. HOME BOUND STATUS: I certify that my clinical findings support that this patient is homebound (i.e., Due to illness or injury, pt requires aid of supportive devices such as crutches, cane, wheelchairs, walkers, the use of special transportation or the assistance of another person to leave their place of residence. There is a normal inability to leave the home and doing so requires considerable and taxing effort. Other absences are for medical reasons / religious  services and are infrequent or of short duration when for other reasons). o If current dressing causes regression in wound condition, may D/C ordered dressing product/s and apply Normal Saline Moist Dressing daily until next Wound Healing Center / Other MD appointment. Notify Wound Healing Center of regression in wound condition at [phone]. o Please direct any NON-WOUND  related issues/requests for orders to patient's Primary Care Physician Wound #3 Left,Medial Lower Leg o Continue Home Health Visits o Home Health Nurse may visit PRN to address patientos wound care needs. o FACE TO FACE ENCOUNTER: MEDICARE and MEDICAID PATIENTS: I certify that this patient is under my care and that I had a face-to-face encounter that meets the physician face-to-face encounter requirements with this patient on this date. The encounter with the patient was in whole or in part for the following MEDICAL CONDITION: (primary reason for Home Healthcare) MEDICAL NECESSITY: I certify, that based on my findings, NURSING services are a medically necessary home health service. HOME BOUND STATUS: I certify that my clinical findings support that this patient is homebound (i.e., Due to illness or injury, pt requires aid of supportive devices such as crutches, cane, wheelchairs, walkers, the use of special transportation or the assistance of another person to leave their place of residence. There is a normal inability to leave the home and doing so requires considerable and taxing effort. Other absences are for medical reasons / religious services and are infrequent or of short duration when for other reasons). o If current dressing causes regression in wound condition, may D/C ordered dressing product/s and apply Normal Saline Moist Dressing daily until next Wound Healing Center / Other MD appointment. Notify Wound Healing Center of regression in wound condition at [phone]. o Please direct any  NON-WOUND related issues/requests for orders to patient's Primary Care Physician Wound #4 Right,Medial Lower Leg o Continue Home Health Visits KADYNCE, BONDS ([id number]) o Home Health Nurse may visit PRN to address patientos wound care needs. o FACE TO FACE ENCOUNTER: MEDICARE and MEDICAID PATIENTS: I certify that this patient is under my care and that I had a face-to-face encounter that meets the physician face-to-face encounter requirements with this patient on this date. The encounter with the patient was in whole or in part for the following MEDICAL CONDITION: (primary reason for Home Healthcare) MEDICAL NECESSITY: I certify, that based on my findings, NURSING services are a medically necessary home health service. HOME BOUND STATUS: I certify that my clinical findings support that this patient is homebound (i.e., Due to illness or injury, pt requires aid of supportive devices such as crutches, cane, wheelchairs, walkers, the use of special transportation or the assistance of another person to leave their place of residence. There is a normal inability to leave the home and doing so requires considerable and taxing effort. Other absences are for medical reasons / religious services and are infrequent or of short duration when for other reasons). o If current dressing causes regression in wound condition, may D/C ordered dressing product/s and apply Normal Saline Moist Dressing daily until next Wound Healing Center / Other MD appointment. Notify Wound Healing Center of regression in wound condition at [phone]. o Please direct any NON-WOUND related issues/requests for orders to patient's Primary Care Physician Electronic Signature(s) Signed: 05/12/2017 4:44:14 PM By: Baltazar Najjar MD Signed: 05/12/2017 5:13:17 PM By: [name] Entered By: [name] on 05/12/2017 14:13:17 [place], [name]  ([id number]) -------------------------------------------------------------------------------- Problem List Details Patient Name: SIMAYA, LUMADUE. Date of Service: 05/12/2017 1:30 PM Medical Record Patient Account Number: [id number] Number: Treating RN: [name] [birth year] (83 y.o. 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 L97.221 Non-pressure chronic ulcer of left calf limited to 02/09/2017 Yes breakdown of skin I70.233 Atherosclerosis of native arteries of right leg with 02/25/2017 Yes ulceration of ankle I70.243 Atherosclerosis of native arteries of left leg with ulceration 02/25/2017 Yes of ankle Inactive Problems Resolved Problems Electronic Signature(s) Signed: 05/12/2017 4:44:14 PM By: Baltazar Najjar MD Entered By: Baltazar Najjar on 05/12/2017 14:24:44 [place], [name] ([id number]) -------------------------------------------------------------------------------- Progress Note Details [name] Date of Service: 05/12/2017 1:30 PM Medical Record Patient Account Number: [id number] Number: Treating RN: [name] [birth year] (83 y.o. Other Clinician: Date of Birth/Sex: Female) Treating ROBSON, MICHAEL Primary Care Provider: Guy Begin Provider/Extender: G Referring Provider: [name] in Treatment: 13 Subjective Chief Complaint Information obtained from Patient the patient is here for follow-up evaluation of her bilateral lower extremity ulcers History of Present Illness (HPI) 06/10/16;  this is an 83 year old woman who lives near [place] with family members. She saw her primary doctor roughly over a week ago and was referred to the ER for a cellulitis in the right medial leg. She was given a round of IV antibiotics in the emergency room and discharged on oral Keflex which she is taking. She is been left with a uncomfortable leg area on the right medial lower leg. Her ABIs in this clinic were 0.8 on the right 0.6 on the left. She does not describe claudication. She is not a diabetic. Lab work in the ER showed a normal BUN and creatinine on 6/20 at 18 and 0.92 respectively white count was 10.1 hemoglobin at 13.8. She did not have any imaging studies. I can see no vascular evaluations in Epic. She does not have a prior wound history 06/17/16; the area affected on her right medial ankle is an area I thought was due to tissue damage from cellulitis last week. She is going for venous reflux studies this afternoon, these were not ordered in this clinic at least not by me. The patient does not give a prior history of damage to the skin in this area although I wonder if she actually might not of noticed it. We have been using's Aquacel Ag under a wrap although we will not be able to wrap her today 06/24/16: pt reports today she hasn't heard from the vascular clinic regarding proceeding with arterial studies. our office will assist with this today. she denies s/s of infection. denies problems with her wraps. 07/07/16; since I last saw this woman she was admitted to hospital from 7/16 through 7/18 with sepsis syndrome felt to be secondary to her ulcer. She was treated with Vanco and Zosyn the hospital discharged on Septra. Blood cultures were negative. Urine culture showed multiple species she is now feeling well using Santyl with border foam to her wound. [facility] 07/14/16; no major improvement here. She has a quarter-sized wound covered with thick adherent slough on the  lateral aspect of her right leg with several surrounding satellite lesions in a similar state. She has had 2 recent bouts of cellulitis, one before she came to this clinic and one required a recent hospitalization. I have asked for arterial studies I don't believe these have ever been done. My notes suggest from early in July that she went for reflux studies although I don't know that I have ever seen these either, she did have a DVT rule out but I did  not order this, this did not show a DVT but did show a Baker's cyst 07/21/2016 -- the patient had an arterial duplex study done with waveform suggestive of right femoropopliteal disease. There is also small vessel disease bilaterally. Right ABI was in the normal range of 1.0 and a TBI Switzer, Jordan J. ([id number]) was 0.74. Left ABI is abnormal at 0.69 with a TBI of 0.40. Three-vessel runoff on the right and a two-vessel runoff on the left. She has an appointment to see Dr. Kirke Corin later today. 07/28/2016 -- was seen by Dr. Kirke Corin who reviewed her arterial studies with an ABI of 0.94 on the right and 0.69 on the left with normal right toe brachial index. Duplex showed diffuse atherosclerosis without discrete stenosis and there was a three-vessel runoff on the right below the knee. Based on these studies and the location of the ulcer he thought it was venous commended she continue with wound care consults. 08/04/16; I note the patient's reasonably functional arterial supply which is fortunate. The wound bed appears to be smaller. Debrided of surface slough. She is been using Iodoflex 08/11/16: Only a very small open area remains. However it has some depth 08/18/16; small open area is smaller. Still open however. 08/25/16; the area has totally closed down and epithelialized. READMISSION 02/09/17; this is an 83 year old woman who is not a diabetic or smoker. She was in our clinic in the summer into mid-September with a wound on her right medial calf that. This  eventually closed down. I thought this was mostly venous. She did have evidence of PAD and she was seen by Dr. Kirke Corin. At that point she had an ABI of 0.94 on the right 0.69 on the left with a normal right toe brachial index. It was felt that she had enough blood flow to close her wound and that happened. She was discharged with stockings although I don't get the sense that she really use them she tells me that roughly a week or 2 she developed swelling in her bilateral lower legs and then noted wounds on her right lateral malleolus and left medial calf. The on this she is not really sure how this happened. She is not having a lot of pain although she is not ambulatory that much only in her home. Other than weight loss she has not noticed any other issues no specific claudication no chest pain. 02/16/17; the patient continues with 2 wounds on the right lateral malleolus and left medial calf. Both of these are painful. She has chronic venous insufficiency but also known PAD and is previously seen Dr. Kirke Corin. We have arranged vascular follow-up with Dr. Kirke Corin to look at the arterial situation 02/25/2017 -- patient was brought in today because she missed her appointment last week with Dr. Leanord Hawking and is having a lot of pain and drainage from her wounds. The interim she was seen by Dr. Bascom Levels who did a duplex of the right lower extremity which shows a right ABI of 0.95 and left ABI 0.74. There was also a abdominal aortogram with a bilateral extremity angiography done for significant peripheral vascular disease. The right lower extremity showed diffuse SFA and popliteal disease with significant stenosis affecting the TP trunk just before the origin of the peritoneal artery. The left lower extremity showed moderate left SFA stenosis with occluded popliteal artery and tibioperoneal vessels with only visible collateral. The opinion was the patient had no endovascular surgical revascularization options of the  left lower extremity and Dr. Bascom Levels would  discuss it with Dr. Randie Heinzain for a second opinion. Endovascular intervention on the right TP trunk could be considered. 03/03/17; the patient arrives back today having last seen Dr. Meyer RusselBritto. As noted she has severe PAD which is not amenable to either bypass surgery or endovascular interventions on the left. Endovascular interventions in the right TP trunk could be considered. She has large wounds on the right medial leg and a sizable wound on the right lateral leg. Small wound on the left medial leg. She is really in a lot of discomfort. Using Santyl on the right and I believe Prisma on the left. 03/10/17; the patient arrives today again with large wounds on the right lateral lower extremity, right medial lower extremity. And a small punched out wound on the left medial lower extremity. These are in the setting of severe non-revascularizable PAD. She continues to complain of discomfort although I don't think right now this is unmanageable. We have been using Santyl. We are arranging getting her home health [facility] [name], Jordan J. ([id number]) 03/16/17- patient is here for follow-up evaluation of her bilateral lower extremity ulcers. She has an appointment on 4/11 for an arth[facility] per [facility]. She is continuing to [place]. Her son is doing her dressing changes 03/30/17 still using santyl. sincer her last visit she was admitted from 4/11-4/12 for arthrectomy and balloon angioplasty of the right distal popliteal artery with TP trunk and peritoneal arteries. she is on dual antiplatelet and states her pain is somewhat better on the right 04/13/17 still using santyl to all wounds. Area on the right medial and left lateral look better. Still difficulties with the right lateral wouind. Combination of arterial insufficiency and Venous stasis No revascularization options on the left. Possible interventions on the right (see above) 04/20/17; the  patient is still using Santyl to all wound areas. We've had some improvement in the right medial and left lateral. Patient has a combination of arterial and venous insufficiency. No revascularization options are available to the patient on the left however possible interventions on the right. 04/27/17; I continued using Santyl to all wound areas. We've had improvement in the right medial wound and the left lateral wound. The right lateral lower leg wound has remained static. She arrives today with a lot more edema in her bilateral legs this is pitting. She does not complain of chest pain or shortness of breath tells me she has no prior cardiac issues. 05/12/17; I been using Santyl to all of this lady's wounds. She has an appointment with Dr. Kirke CorinArida next week. The other issue this week is that apparently her insurance and [facility] are no longer under contract though there was some suggestion that there is another home health company, we can't really determine who that was today. We change the primary dressing to all the wounds to Hydrofera Blue Objective Constitutional Sitting or standing Blood Pressure is within target range for patient.. Pulse regular and within target range for patient.Marland Kitchen. Respirations regular, non-labored and within target range.. Temperature is normal and within the target range for the patient.Marland Kitchen. appears in no distress. Vitals Time Taken: 1:42 PM, Height: 66 in, Weight: 159 lbs, BMI: 25.7, Temperature: 98.3 F, Pulse: 87 bpm, Respiratory Rate: 16 breaths/min, Blood Pressure: 131/71 mmHg. Cardiovascular Pedal pulses absent bilaterally.. General Notes: When exam; the area on her right medial leg and the left medial leg looks stable. Covered in necrotic eschar, however the size of the wounds appears to be stable. The area  on the right lateral ankle area also has surface slough. New area on the left anterior leg superficial All wounds debrided with a #3 curet. There is  bleeding. Hemostasis with direct pressure Integumentary (Hair, Skin) Wound #2 status is Open. Original cause of wound was Gradually Appeared. The wound is located on the Right,Lateral Malleolus. The wound measures 3cm length x 1.8cm width x 0.1cm depth; 4.241cm^2 area Tremblay, Jordan J. ([id number]) and 0.424cm^3 volume. There is no tunneling or undermining noted. There is a large amount of serous drainage noted. The wound margin is distinct with the outline attached to the wound base. There is no granulation within the wound bed. There is a large (67-100%) amount of necrotic tissue within the wound bed including Adherent Slough. The periwound skin appearance exhibited: Maceration. Periwound temperature was noted as No Abnormality. The periwound has tenderness on palpation. Wound #3 status is Open. Original cause of wound was Gradually Appeared. The wound is located on the Left,Medial Lower Leg. The wound measures 3.5cm length x 2.5cm width x 0.1cm depth; 6.872cm^2 area and 0.687cm^3 volume. There is no tunneling or undermining noted. There is a large amount of serous drainage noted. The wound margin is distinct with the outline attached to the wound base. There is no granulation within the wound bed. There is a large (67-100%) amount of necrotic tissue within the wound bed including Adherent Slough. The periwound skin appearance exhibited: Maceration. Periwound temperature was noted as No Abnormality. The periwound has tenderness on palpation. Wound #4 status is Open. Original cause of wound was Gradually Appeared. The wound is located on the Right,Medial Lower Leg. The wound measures 7cm length x 5cm width x 0.1cm depth; 27.489cm^2 area and 2.749cm^3 volume. There is no tunneling or undermining noted. There is a large amount of serous drainage noted. The wound margin is distinct with the outline attached to the wound base. There is no granulation within the wound bed. There is a large (67-100%)  amount of necrotic tissue within the wound bed including Adherent Slough. The periwound skin appearance exhibited: Maceration. Periwound temperature was noted as No Abnormality. The periwound has tenderness on palpation. Wound #5 status is Open. Original cause of wound was Gradually Appeared. The wound is located on the Left,Anterior Lower Leg. The wound measures 1.5cm length x 1cm width x 0.1cm depth; 1.178cm^2 area and 0.118cm^3 volume. There is no tunneling or undermining noted. There is a large amount of serous drainage noted. The wound margin is distinct with the outline attached to the wound base. There is no granulation within the wound bed. There is a large (67-100%) amount of necrotic tissue within the wound bed including Adherent Slough. The periwound skin appearance exhibited: Maceration. Periwound temperature was noted as No Abnormality. The periwound has tenderness on palpation. 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 ([id number]) Wound #2 Pre-procedure diagnosis of Wound #2 is a Venous Leg Ulcer located on the Right,Lateral Malleolus .Severity of Tissue Pre Debridement is: Fat layer exposed. There was a Skin/Subcutaneous Tissue Debridement ([id number]) debridement with total area of 5.4 sq cm performed by Maxwell Caul, MD. with the following  instrument(s): Curette to remove Viable and Non-Viable tissue/material including Exudate, Fibrin/Slough, and Subcutaneous after achieving pain control using Lidocaine 4% Topical Solution. A  time out was conducted at 14:04, prior to the start of the procedure. A Minimum amount of bleeding was controlled with Pressure. The procedure was tolerated well with a pain level of 0 throughout and a pain level of 0 following the procedure. Post Debridement Measurements: 3cm length x 1.8cm width x 0.1cm depth; 0.424cm^3 volume. Character of Wound/Ulcer Post Debridement requires further debridement. Severity of Tissue Post Debridement is: Fat layer exposed. Post procedure Diagnosis Wound #2: Same as Pre-Procedure Wound #3 Pre-procedure diagnosis of Wound #3 is a Venous Leg Ulcer located on the Left,Medial Lower Leg .Severity of Tissue Pre Debridement is: Fat layer exposed. There was a Skin/Subcutaneous Tissue Debridement ([id number]) debridement with total area of 8.75 sq cm performed by Maxwell Caul, MD. with the following instrument(s): Curette to remove Viable and Non-Viable tissue/material including Exudate, Fibrin/Slough, and Subcutaneous after achieving pain control using Lidocaine 4% Topical Solution. A time out was conducted at 14:04, prior to the start of the procedure. A Minimum amount of bleeding was controlled with Pressure. The procedure was tolerated well with a pain level of 0 throughout and a pain level of 0 following the procedure. Post Debridement Measurements: 3.5cm length x 2.5cm width x 0.1cm depth; 0.687cm^3 volume. Character of Wound/Ulcer Post Debridement requires further debridement. Severity of Tissue Post Debridement is: Fat layer exposed. Post procedure Diagnosis Wound #3: Same as Pre-Procedure Wound #4 Pre-procedure diagnosis of Wound #4 is a Venous Leg Ulcer located on the Right,Medial Lower Leg .Severity of Tissue Pre Debridement is: Fat layer exposed. There was a Skin/Subcutaneous Tissue Debridement ([id number]) debridement with total area of 2 sq cm performed by Maxwell Caul, MD. with the following instrument(s): Curette to remove Viable and  Non-Viable tissue/material including Exudate, Fibrin/Slough, and Subcutaneous after achieving pain control using Lidocaine 4% Topical Solution. A time out was conducted at 14:04, prior to the start of the procedure. A Minimum amount of bleeding was controlled with Pressure. The procedure was tolerated well with a pain level of 0 throughout and a pain level of 0 following the procedure. Post Debridement Measurements: 7cm length x 5cm width x 0.1cm depth; 2.749cm^3 volume. Character of Wound/Ulcer Post Debridement requires further debridement. Severity of Tissue Post Debridement is: Fat layer exposed. Post procedure Diagnosis Wound #4: Same as Pre-Procedure [name], Jordan J. ([id number]) Plan Wound Cleansing: Wound #2 Right,Lateral Malleolus: Clean wound with Normal Saline. Cleanse wound with mild soap and water Wound #5 Left,Anterior Lower Leg: Clean wound with Normal Saline. Cleanse wound with mild soap and water Wound #3 Left,Medial Lower Leg: Clean wound with Normal Saline. Cleanse wound with mild soap and water Wound #4 Right,Medial Lower Leg: Clean wound with Normal Saline. Cleanse wound with mild soap and water Anesthetic: Wound #2 Right,Lateral Malleolus: Topical Lidocaine 4% cream applied to wound bed prior to debridement - clinic use only Wound #5 Left,Anterior Lower Leg: Topical Lidocaine 4% cream applied to wound bed prior to debridement - clinic use only Wound #3 Left,Medial Lower Leg: Topical Lidocaine 4% cream applied to wound bed prior to debridement - clinic use only Wound #4 Right,Medial Lower Leg: Topical Lidocaine 4% cream applied to wound bed prior to debridement - clinic use only Primary Wound Dressing: Wound #2 Right,Lateral Malleolus: Hydrafera Blue Wound #5 Left,Anterior Lower Leg: Hydrafera Blue Wound #3 Left,Medial Lower Leg: Hydrafera Blue Wound #4 Right,Medial Lower Leg: Hydrafera Blue  Secondary Dressing: Wound #2 Right,Lateral Malleolus: ABD  pad Wound #3 Left,Medial Lower Leg: ABD pad Wound #5 Left,Anterior Lower Leg: ABD pad Wound #4 Right,Medial Lower Leg: ABD pad Dressing Change Frequency: Wound #2 Right,Lateral Malleolus: Change dressing every week Wound #5 Left,Anterior Lower Leg: Change dressing every week Wound #3 Left,Medial Lower Leg: Change dressing every week [name], AZAR. ([id number]) Wound #4 Right,Medial Lower Leg: Change dressing every week Follow-up Appointments: Wound #2 Right,Lateral Malleolus: Return Appointment in 1 week. Wound #5 Left,Anterior Lower Leg: Return Appointment in 1 week. Wound #3 Left,Medial Lower Leg: Return Appointment in 1 week. Wound #4 Right,Medial Lower Leg: Return Appointment in 1 week. Edema Control: Wound #2 Right,Lateral Malleolus: Kerlix and Coban - Bilateral - wrap lightly unna to anchor Elevate legs to the level of the heart and pump ankles as often as possible Wound #5 Left,Anterior Lower Leg: Kerlix and Coban - Bilateral - wrap lightly unna to anchor Elevate legs to the level of the heart and pump ankles as often as possible Wound #3 Left,Medial Lower Leg: Kerlix and Coban - Bilateral - wrap lightly unna to anchor Elevate legs to the level of the heart and pump ankles as often as possible Wound #4 Right,Medial Lower Leg: Kerlix and Coban - Bilateral - wrap lightly unna to anchor Elevate legs to the level of the heart and pump ankles as often as possible Additional Orders / Instructions: Wound #2 Right,Lateral Malleolus: Increase protein intake. Wound #5 Left,Anterior Lower Leg: Increase protein intake. Wound #3 Left,Medial Lower Leg: Increase protein intake. Wound #4 Right,Medial Lower Leg: Increase protein intake. Home Health: Wound #2 Right,Lateral Malleolus: Continue Home Health Visits Home Health Nurse may visit PRN to address patient s wound care needs. FACE TO FACE ENCOUNTER: MEDICARE and MEDICAID PATIENTS: I certify that this patient is  under my care and that I had a face-to-face encounter that meets the physician face-to-face encounter requirements with this patient on this date. The encounter with the patient was in whole or in part for the following MEDICAL CONDITION: (primary reason for Home Healthcare) MEDICAL NECESSITY: I certify, that based on my findings, NURSING services are a medically necessary home health service. HOME BOUND STATUS: I certify that my clinical findings support that this patient is homebound (i.e., Due to illness or injury, pt requires aid of supportive devices such as crutches, cane, wheelchairs, walkers, the use of special transportation or the assistance of another person to leave their place of residence. There is a normal inability to leave the home and doing so requires considerable and taxing effort. Other absences are for medical reasons / religious services and are infrequent or of short duration when for other reasons). If current dressing causes regression in wound condition, may D/C ordered dressing product/s and apply Normal Saline Moist Dressing daily until next Wound Healing Center / Other MD appointment. Notify Wound Healing Center of regression in wound condition at [phone]. Please direct any NON-WOUND related issues/requests for orders to patient's Primary Care Physician [name], [name] ([id number]) Wound #5 Left,Anterior Lower Leg: Continue Home Health Visits Home Health Nurse may visit PRN to address patient s wound care needs. FACE TO FACE ENCOUNTER: MEDICARE and MEDICAID PATIENTS: I certify that this patient is under my care and that I had a face-to-face encounter that meets the physician face-to-face encounter requirements with this patient on this date. The encounter with the patient was in whole or in part for the following MEDICAL CONDITION: (primary reason for Home Healthcare) MEDICAL NECESSITY: I  certify, that based on my findings, NURSING services are a medically  necessary home health service. HOME BOUND STATUS: I certify that my clinical findings support that this patient is homebound (i.e., Due to illness or injury, pt requires aid of supportive devices such as crutches, cane, wheelchairs, walkers, the use of special transportation or the assistance of another person to leave their place of residence. There is a normal inability to leave the home and doing so requires considerable and taxing effort. Other absences are for medical reasons / religious services and are infrequent or of short duration when for other reasons). If current dressing causes regression in wound condition, may D/C ordered dressing product/s and apply Normal Saline Moist Dressing daily until next Wound Healing Center / Other MD appointment. Notify Wound Healing Center of regression in wound condition at [phone]. Please direct any NON-WOUND related issues/requests for orders to patient's Primary Care Physician Wound #3 Left,Medial Lower Leg: Continue Home Health Visits Home Health Nurse may visit PRN to address patient s wound care needs. FACE TO FACE ENCOUNTER: MEDICARE and MEDICAID PATIENTS: I certify that this patient is under my care and that I had a face-to-face encounter that meets the physician face-to-face encounter requirements with this patient on this date. The encounter with the patient was in whole or in part for the following MEDICAL CONDITION: (primary reason for Home Healthcare) MEDICAL NECESSITY: I certify, that based on my findings, NURSING services are a medically necessary home health service. HOME BOUND STATUS: I certify that my clinical findings support that this patient is homebound (i.e., Due to illness or injury, pt requires aid of supportive devices such as crutches, cane, wheelchairs, walkers, the use of special transportation or the assistance of another person to leave their place of residence. There is a normal inability to leave the home and  doing so requires considerable and taxing effort. Other absences are for medical reasons / religious services and are infrequent or of short duration when for other reasons). If current dressing causes regression in wound condition, may D/C ordered dressing product/s and apply Normal Saline Moist Dressing daily until next Wound Healing Center / Other MD appointment. Notify Wound Healing Center of regression in wound condition at [phone]. Please direct any NON-WOUND related issues/requests for orders to patient's Primary Care Physician Wound #4 Right,Medial Lower Leg: Continue Home Health Visits Home Health Nurse may visit PRN to address patient s wound care needs. FACE TO FACE ENCOUNTER: MEDICARE and MEDICAID PATIENTS: I certify that this patient is under my care and that I had a face-to-face encounter that meets the physician face-to-face encounter requirements with this patient on this date. The encounter with the patient was in whole or in part for the following MEDICAL CONDITION: (primary reason for Home Healthcare) MEDICAL NECESSITY: I certify, that based on my findings, NURSING services are a medically necessary home health service. HOME BOUND STATUS: I certify that my clinical findings support that this patient is homebound (i.e., Due to illness or injury, pt requires aid of supportive devices such as crutches, cane, wheelchairs, walkers, the use of special transportation or the assistance of another person to leave their place of residence. There is a normal inability to leave the home and doing so requires considerable and taxing effort. Other absences are for medical reasons / religious services and are infrequent or of short duration when for other reasons). If current dressing causes regression in wound condition, may D/C ordered dressing product/s and apply Normal Saline Moist Dressing daily  until next Wound Healing Center / Other MD appointment. Notify Wound Healing Center of  regression in wound condition at [phone]. Please direct any NON-WOUND related issues/requests for orders to patient's Primary Care Physician JAKYAH, BRADBY ([id number]) #1 all of the wounds are dressed with hydrotherapy. #2 we put Kerlix and light Coban on these with the intentions of keeping these on all week however if we are able to sort out the new homecare agency then we will forward them these orders. #3 the patient has a follow-up with Dr. Kirke Corin next week. He did offer a possibility of additional interventions on the right but not the left Electronic Signature(s) Signed: 05/12/2017 4:44:14 PM By: Baltazar Najjar MD Entered By: Baltazar Najjar on 05/12/2017 14:30:15 Haun, [name] ([id number]) -------------------------------------------------------------------------------- SuperBill Details [name] Date of Service: 05/12/2017 Medical Record Patient Account Number: [id number] Number: Treating RN: [name] 1931/07/30 (83 y.o. 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 arteries of right leg with ulceration of ankle I70.243 Atherosclerosis of native arteries of left leg with ulceration of ankle Facility Procedures CPT4 Code Description: [id number] 11042 - DEB SUBQ TISSUE 20 SQ CM/< ICD-10 Description Diagnosis 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

## 2017-05-19 ENCOUNTER — Encounter: Payer: Medicare Other | Attending: Internal Medicine | Admitting: Internal Medicine

## 2017-05-19 DIAGNOSIS — L97221 Non-pressure chronic ulcer of left calf limited to breakdown of skin: Secondary | ICD-10-CM | POA: Diagnosis not present

## 2017-05-19 DIAGNOSIS — L97313 Non-pressure chronic ulcer of right ankle with necrosis of muscle: Secondary | ICD-10-CM | POA: Diagnosis not present

## 2017-05-19 DIAGNOSIS — I70243 Atherosclerosis of native arteries of left leg with ulceration of ankle: Secondary | ICD-10-CM | POA: Insufficient documentation

## 2017-05-19 DIAGNOSIS — I70233 Atherosclerosis of native arteries of right leg with ulceration of ankle: Secondary | ICD-10-CM | POA: Diagnosis not present

## 2017-05-19 DIAGNOSIS — I10 Essential (primary) hypertension: Secondary | ICD-10-CM | POA: Insufficient documentation

## 2017-05-19 DIAGNOSIS — I87333 Chronic venous hypertension (idiopathic) with ulcer and inflammation of bilateral lower extremity: Secondary | ICD-10-CM | POA: Insufficient documentation

## 2017-05-20 NOTE — Telephone Encounter (Signed)
Error

## 2017-05-20 NOTE — Progress Notes (Signed)
Jordan, Hopkins ([id number]) Visit Report for 05/19/2017 Arrival Information Details [name], [name]. Date of Service: 05/19/2017 2:30 PM Medical Record Patient Account Number: [id number] Number: Treating RN: Phillis Hopkins [birth year] (83 y.o. Other Clinician: Date of Birth/Sex: Female) Treating [name] Primary Care [name]: [name]/Extender: G Referring Luisa Louk: [name] in Treatment: 14 Visit Information History Since Last Visit All ordered tests and consults were completed: No Patient Arrived: Wheel Chair Added or deleted any medications: No Arrival Time: 14:33 Any new allergies or adverse reactions: No Accompanied By: son Had a fall or experienced change in No Transfer Assistance: EasyPivot Patient activities of daily living that may affect Lift risk of falls: Patient Identification Verified: Yes Signs or symptoms of abuse/neglect since last No Secondary Verification Process Yes visito Completed: Has Dressing in Place as Prescribed: Yes Patient Requires Transmission- No Has Compression in Place as Prescribed: Yes Based Precautions: Pain Present Now: No Patient Has Alerts: Yes Patient Alerts: Patient on Blood Thinner Electronic Signature(s) Signed: 05/19/2017 5:14:09 PM By: [name] Entered By: [name] on 05/19/2017 15:56:23 [name] ([id number]) -------------------------------------------------------------------------------- Encounter Discharge Information Details [name] Date of Service: 05/19/2017 2:30 PM Medical Record Patient Account Number: [id number] Number: Treating RN: Phillis Hopkins 28-May-1931 (83 y.o. Other Clinician: Date of Birth/Sex: Female) Treating [name] Primary Care Dyanne Yorks: Jordan Begin [name]/Extender: G Referring Cassady Stanczak: [name] in Treatment: 14 Encounter Discharge Information Items Discharge Pain  Level: 0 Discharge Condition: Stable Ambulatory Status: Wheelchair Discharge Destination: Home Private Transportation: Auto Accompanied By: son Schedule Follow-up Appointment: Yes Medication Reconciliation completed and Yes provided to Patient/Care [name]: Clinical Summary of Care: Provided Form Type Recipient Paper Patient cf Electronic Signature(s) Signed: 05/19/2017 3:28:04 PM By: [name] Entered By: Jordan Martes on 05/19/2017 15:28:04 [name] ([id number]) -------------------------------------------------------------------------------- Lower Extremity Assessment Details [name] Date of Service: 05/19/2017 2:30 PM Medical Record Patient Account Number: [id number] Number: Treating RN: Phillis Hopkins 11/24/1931 (83 y.o. Other Clinician: Date of Birth/Sex: Female) Treating [name] Primary Care Kiven Vangilder: Jordan Begin [name]/Extender: G Referring Senie Lanese: [name] in Treatment: 14 Vascular Assessment Pulses: Dorsalis Pedis Palpable: [Left:Yes] [Right:Yes] Posterior Tibial Extremity colors, hair growth, and conditions: Extremity Color: [Left:Hyperpigmented] [Right:Hyperpigmented] Temperature of Extremity: [Left:Warm] [Right:Warm] Capillary Refill: [Left:> 3 seconds] [Right:> 3 seconds] Toe Nail Assessment Left: Right: Thick: Yes Yes Discolored: Yes Yes Deformed: Yes Yes Improper Length and Hygiene: No No Electronic Signature(s) Signed: 05/19/2017 5:14:09 PM By: [name] Entered By: [name] on 05/19/2017 15:08:26 [name] ([id number]) -------------------------------------------------------------------------------- Multi Wound Chart Details [name] Date of Service: 05/19/2017 2:30 PM Medical Record Patient Account Number: [id number] Number: Treating RN: Phillis Hopkins 12-29-1930 (83 y.o. Other  Clinician: Date of Birth/Sex: Female) Treating [name] Primary Care Alfred Harrel: Jordan Begin [name]/Extender: G Referring [name] : Jordan Begin Weeks in Treatment: 14 Vital Signs Height(in): 66 Pulse(bpm): 76 Weight(lbs): 159 Blood Pressure 131/62 (mmHg): Body Mass Index(BMI): 26 Temperature(F): 98.2 Respiratory Rate 16 (breaths/min): Photos: [2:No Photos] [3:No Photos] [4:No Photos] Wound Location: [2:Right Malleolus - Lateral] [3:Left Lower Leg - Medial] [4:Right Lower Leg - Medial] Wounding Event: [2:Gradually Appeared] [3:Gradually Appeared] [4:Gradually Appeared] Primary Etiology: [2:Venous Leg Ulcer] [3:Venous Leg Ulcer] [4:Venous Leg Ulcer] Comorbid History: [2:Cataracts, Arrhythmia, Hypertension, Osteoarthritis] [3:Cataracts, Arrhythmia, Hypertension, Osteoarthritis] [4:Cataracts, Arrhythmia, Hypertension, Osteoarthritis] Date Acquired: [2:01/19/2017] [3:01/19/2017] [4:02/16/2017] Weeks of Treatment: [2:14] [3:14] [4:13] Wound Status: [2:Open] [3:Open] [4:Open] Measurements  L x W x D 2.9x1.5x0.1 [3:3x2.5x0.1] [4:3.7x4.2x0.1] (cm) Area (cm) : [2:3.416] [3:5.89] [4:12.205] Volume (cm) : [2:0.342] [3:0.589] [4:1.221] % Reduction in Area: [2:17.50%] [3:-1398.70%] [4:-7673.90%] % Reduction in Volume: 17.40% [3:-1410.30%] [4:-7531.20%] Classification: [2:Full Thickness Without Exposed Support Structures] [3:Full Thickness Without Exposed Support Structures] [4:Full Thickness Without Exposed Support Structures] Exudate Amount: [2:Large] [3:Large] [4:Large] Exudate Type: [2:Serous] [3:Serous] [4:Serosanguineous] Exudate Color: [2:amber] [3:amber] [4:red, brown] Wound Margin: [2:Distinct, outline attached] [3:Distinct, outline attached] [4:Distinct, outline attached] Granulation Amount: [2:Medium (34-66%)] [3:Medium (34-66%)] [4:None Present (0%)] Granulation Quality: [2:Red] [3:Red] [4:N/A] Necrotic Amount: [2:Medium (34-66%)] [3:Medium (34-66%)] [4:Large  (67-100%)] Epithelialization: [2:None] [3:None] [4:None] Debridement: [name], [name]. ([id number]) Debridement (40981- Debridement (19147- Debridement ([id number] [id number]) 11047) 11047) Pre-procedure 15:09 15:09 15:09 Verification/Time Out Taken: Pain Control: Lidocaine 4% Topical Lidocaine 4% Topical Lidocaine 4% Topical Solution Solution Solution Tissue Debrided: Fibrin/Slough, Exudates, Fibrin/Slough, Exudates, Fibrin/Slough, Exudates, Subcutaneous Subcutaneous Subcutaneous Level: Skin/Subcutaneous Skin/Subcutaneous Skin/Subcutaneous Tissue Tissue Tissue Debridement Area (sq 4.35 7.5 4 cm): Instrument: Curette Curette Curette Bleeding: Minimum Minimum Minimum Hemostasis Achieved: Pressure Pressure Pressure Procedural Pain: 0 0 0 Post Procedural Pain: 0 0 0 Debridement Treatment Procedure was tolerated Procedure was tolerated Procedure was tolerated Response: well well well Post Debridement 2.9x1.5x0.1 3x2.5x0.1 3.7x4.2x0.1 Measurements L x W x D (cm) Post Debridement 0.342 0.589 1.221 Volume: (cm) Periwound Skin Texture: No Abnormalities Noted No Abnormalities Noted No Abnormalities Noted Periwound Skin Maceration: Yes Maceration: Yes Maceration: Yes Moisture: Periwound Skin Color: No Abnormalities Noted No Abnormalities Noted No Abnormalities Noted Temperature: No Abnormality No Abnormality No Abnormality Tenderness on Yes Yes Yes Palpation: Wound Preparation: Ulcer Cleansing: Ulcer Cleansing: Ulcer Cleansing: Rinsed/Irrigated with Rinsed/Irrigated with Rinsed/Irrigated with Saline, Other: soap and Saline, Other: soap and Saline, Other: soap and water water water Topical Anesthetic Topical Anesthetic Topical Anesthetic Applied: Other: lidocaine Applied: Other: lidocaine Applied: Other: lidocaine 4% 4% 4% Procedures Performed: Debridement Debridement Debridement Wound Number: 5 N/A N/A Photos: No Photos N/A N/A Wound Location: Left Lower Leg - Anterior N/A  N/A Wounding Event: Gradually Appeared N/A N/A Primary Etiology: Venous Leg Ulcer N/A N/A Comorbid History: Cataracts, Arrhythmia, N/A N/A Hypertension, Osteoarthritis [name], PAL. ([id number]) Date Acquired: 05/12/2017 N/A N/A Weeks of Treatment: 1 N/A N/A Wound Status: Open N/A N/A Measurements L x W x D 0.2x0.2x0.1 N/A N/A (cm) Area (cm) : 0.031 N/A N/A Volume (cm) : 0.003 N/A N/A % Reduction in Area: 97.40% N/A N/A % Reduction in Volume: 97.50% N/A N/A Classification: Partial Thickness N/A N/A Exudate Amount: Large N/A N/A Exudate Type: Serous N/A N/A Exudate Color: amber N/A N/A Wound Margin: Distinct, outline attached N/A N/A Granulation Amount: Large (67-100%) N/A N/A Granulation Quality: Red N/A N/A Necrotic Amount: None Present (0%) N/A N/A Epithelialization: None N/A N/A Debridement: N/A N/A N/A Pain Control: N/A N/A N/A Tissue Debrided: N/A N/A N/A Level: N/A N/A N/A Debridement Area (sq N/A N/A N/A cm): Instrument: N/A N/A N/A Bleeding: N/A N/A N/A Hemostasis Achieved: N/A N/A N/A Procedural Pain: N/A N/A N/A Post Procedural Pain: N/A N/A N/A Debridement Treatment N/A N/A N/A Response: Post Debridement N/A N/A N/A Measurements L x W x D (cm) Post Debridement N/A N/A N/A Volume: (cm) Periwound Skin Texture: No Abnormalities Noted N/A N/A Periwound Skin Maceration: Yes N/A N/A Moisture: Periwound Skin Color: No Abnormalities Noted N/A N/A Temperature: No Abnormality N/A N/A Tenderness on Yes N/A N/A Palpation: Wound Preparation: Ulcer Cleansing: N/A N/A Rinsed/Irrigated with Saline, Other: soap and water Topical Anesthetic Luiza, Carranco Taci J. ([id number]) Applied: Other: lidocaine  4% Procedures Performed: N/A N/A N/A Treatment Notes Wound #2 (Right, Lateral Malleolus) 1. Cleansed with: Clean wound with Normal Saline Cleanse wound with antibacterial soap and water 2. Anesthetic Topical Lidocaine 4% cream to wound bed prior to  debridement 4. Dressing Applied: Hydrafera Blue 5. Secondary Dressing Applied ABD Pad 7. Secured with Tape Notes kerlix, coban, unna to anchor, drawtex Wound #3 (Left, Medial Lower Leg) 1. Cleansed with: Clean wound with Normal Saline Cleanse wound with antibacterial soap and water 2. Anesthetic Topical Lidocaine 4% cream to wound bed prior to debridement 4. Dressing Applied: Hydrafera Blue 5. Secondary Dressing Applied ABD Pad 7. Secured with Tape Notes kerlix, coban, unna to anchor Wound #4 (Right, Medial Lower Leg) 1. Cleansed with: Clean wound with Normal Saline Cleanse wound with antibacterial soap and water 2. Anesthetic Topical Lidocaine 4% cream to wound bed prior to debridement 4. Dressing Applied: Hydrafera Blue 5. Secondary Dressing Applied ABD Pad 7. Secured with [name], Jordan J. ([id number]) Tape Notes kerlix, coban, unna to anchor Wound #5 (Left, Anterior Lower Leg) 1. Cleansed with: Clean wound with Normal Saline Cleanse wound with antibacterial soap and water 2. Anesthetic Topical Lidocaine 4% cream to wound bed prior to debridement 4. Dressing Applied: Hydrafera Blue 5. Secondary Dressing Applied ABD Pad 7. Secured with Tape Notes kerlix, coban, unna to Ecologistanchor Electronic Signature(s) Signed: 05/19/2017 4:31:20 PM By: Baltazar Najjarobson, Michael MD Entered By: Baltazar Najjarobson, [name] on 05/19/2017 15:50:42 Roseman, [name] ([id number]) -------------------------------------------------------------------------------- Multi-Disciplinary Care Plan Details [name], Jordan J. Date of Service: 05/19/2017 2:30 PM Medical Record Patient Account Number: [id number] Number: Treating RN: Phillis Haggisinkerton, Debi 08/30/1931 (83 y.o. Other Clinician: Date of Birth/Sex: Female) Treating [name] Primary Care Taleya Whitcher: Jordan BeginFARAHI, NARGES Litzi Binning/Extender: G Referring Cliff Damiani: Gabriel RungFARAHI, NARGES Weeks in Treatment: 14 Active Inactive ` Abuse / Safety / Falls /  Self Care Management Nursing Diagnoses: Potential for falls Goals: Patient will remain injury free Date Initiated: 02/09/2017 Target Resolution Date: 04/17/2017 Goal Status: Active Interventions: Assess fall risk on admission and as needed Assess self care needs on admission and as needed Notes: ` Nutrition Nursing Diagnoses: Imbalanced nutrition Goals: Patient/caregiver agrees to and verbalizes understanding of need to use nutritional supplements and/or vitamins as prescribed Date Initiated: 02/09/2017 Target Resolution Date: 04/17/2017 Goal Status: Active Interventions: Assess patient nutrition upon admission and as needed per policy Notes: ` Orientation to the Wound Care Program [name], Jordan J. ([id number]) Nursing Diagnoses: Knowledge deficit related to the wound healing center program Goals: Patient/caregiver will verbalize understanding of the Wound Healing Center Program Date Initiated: 02/09/2017 Target Resolution Date: 02/20/2017 Goal Status: Active Interventions: Provide education on orientation to the wound center Notes: ` Pain, Acute or Chronic Nursing Diagnoses: Pain, acute or chronic: actual or potential Potential alteration in comfort, pain Goals: Patient/caregiver will verbalize adequate pain control between visits Date Initiated: 02/09/2017 Target Resolution Date: 04/17/2017 Goal Status: Active Interventions: Assess comfort goal upon admission Complete pain assessment as per visit requirements Notes: ` Wound/Skin Impairment Nursing Diagnoses: Impaired tissue integrity Knowledge deficit related to smoking impact on wound healing Knowledge deficit related to ulceration/compromised skin integrity Goals: Ulcer/skin breakdown will have a volume reduction of 80% by week 12 Date Initiated: 02/09/2017 Target Resolution Date: 04/10/2017 Goal Status: Active Interventions: Assess patient/caregiver ability to perform ulcer/skin care regimen upon admission and  as needed [name], Jordan J. ([id number]) Assess ulceration(s) every visit Notes: Electronic Signature(s) Signed: 05/19/2017 5:14:09 PM By: Jordan MullingPinkerton, Debra Entered By: Jordan MullingPinkerton, Debra on 05/19/2017 15:04:46 Haddon, Yeny J. ([id number]) --------------------------------------------------------------------------------  Pain Assessment Details Patient Name: Jordan, FLANNIGAN. Date of Service: 05/19/2017 2:30 PM Medical Record Patient Account Number: [id number] Number: Treating RN: Phillis Hopkins 1931-02-21 (83 y.o. Other Clinician: Date of Birth/Sex: Female) Treating [name] Primary Care Apollos Tenbrink: Jordan Begin Destiny Trickey/Extender: G Referring Lonie Newsham: [name] in Treatment: 14 Active Problems Location of Pain Severity and Description of Pain Patient Has Paino No Site Locations With Dressing Change: No Pain Management and Medication Current Pain Management: Electronic Signature(s) Signed: 05/19/2017 5:14:09 PM By: [name] Entered By: [name] on 05/19/2017 14:30:45 Fana, Gardiner Hopkins ([id number]) -------------------------------------------------------------------------------- Patient/Caregiver Education Details [name] Date of Service: 05/19/2017 2:30 PM Medical Record Patient Account Number: [id number] Number: Treating RN: Phillis Hopkins 1931-06-14 (83 y.o. Other Clinician: Date of Birth/Gender: Female) Treating [name] Primary Care Physician: Jordan Begin Physician/Extender: G Referring Physician: [name] in Treatment: 14 Education Assessment Education Provided To: Patient Education Topics Provided Wound/Skin Impairment: Handouts: Other: change dressing as ordered Methods: Demonstration, Explain/Verbal Responses: State content correctly Electronic Signature(s) Signed: 05/19/2017 5:14:09 PM By: [name] Entered By: [name] on 05/19/2017 15:05:31 Barfoot, Gardiner Hopkins ([id number]) -------------------------------------------------------------------------------- Wound Assessment Details [name] Date of Service: 05/19/2017 2:30 PM Medical Record Patient Account Number: [id number] Number: Treating RN: Phillis Hopkins [birth year] (83 y.o. Other Clinician: Date of Birth/Sex: Female) Treating [name] Primary Care Laurabelle Gorczyca: Jordan Begin Godwin Tedesco/Extender: G Referring Janith Nielson: Jordan Begin Weeks in Treatment: 14 Wound Status Wound Number: 2 Primary Venous Leg Ulcer Etiology: Wound Location: Right Malleolus - Lateral Wound Status: Open Wounding Event: Gradually Appeared Comorbid Cataracts, Arrhythmia, Hypertension, Date Acquired: 01/19/2017 History: Osteoarthritis Weeks Of Treatment: 14 Clustered Wound: No Photos Photo Uploaded By: [name] on 05/19/2017 16:48:38 Wound Measurements Length: (cm) 2.9 Width: (cm) 1.5 Depth: (cm) 0.1 Area: (cm) 3.416 Volume: (cm) 0.342 % Reduction in Area: 17.5% % Reduction in Volume: 17.4% Epithelialization: None Tunneling: No Undermining: No Wound Description Full Thickness Without Exposed Classification: Support Structures Wound Margin: Distinct, outline attached Exudate Large Amount: Exudate Type: Serous Exudate Color: amber Foul Odor After Cleansing: No Slough/Fibrino Yes Wound Bed Granulation Amount: Medium (34-66%) Abdo, Susann J. ([id number]) Granulation Quality: Red Necrotic Amount: Medium (34-66%) Necrotic Quality: Adherent Slough Periwound Skin Texture Texture Color No Abnormalities Noted: No No Abnormalities Noted: No Moisture Temperature / Pain No Abnormalities Noted: No Temperature: No Abnormality Maceration: Yes Tenderness on Palpation: Yes Wound Preparation Ulcer Cleansing: Rinsed/Irrigated with Saline, Other: soap and water, Topical Anesthetic Applied: Other: lidocaine 4%, Treatment Notes Wound #2 (Right, Lateral  Malleolus) 1. Cleansed with: Clean wound with Normal Saline Cleanse wound with antibacterial soap and water 2. Anesthetic Topical Lidocaine 4% cream to wound bed prior to debridement 4. Dressing Applied: Hydrafera Blue 5. Secondary Dressing Applied ABD Pad 7. Secured with Tape Notes kerlix, coban, unna to anchor, drawtex Electronic Signature(s) Signed: 05/19/2017 5:14:09 PM By: [name] Entered By: [name] on 05/19/2017 14:53:20 Holroyd, Gardiner Hopkins ([id number]) -------------------------------------------------------------------------------- Wound Assessment Details [name] Date of Service: 05/19/2017 2:30 PM Medical Record Patient Account Number: [id number] Number: Treating RN: Phillis Hopkins 11/22/1931 (83 y.o. Other Clinician: Date of Birth/Sex: Female) Treating [name] Primary Care Dyshon Philbin: Jordan Begin Obie Silos/Extender: G Referring Skai Lickteig: Jordan Begin Weeks in Treatment: 14 Wound Status Wound Number: 3 Primary Venous Leg Ulcer Etiology: Wound Location: Left Lower Leg - Medial Wound Status: Open Wounding Event: Gradually Appeared Comorbid Cataracts, Arrhythmia, Hypertension, Date Acquired: 01/19/2017 History: Osteoarthritis Weeks Of Treatment: 14  Clustered Wound: No Photos Photo Uploaded By: [name] on 05/19/2017 16:48:39 Wound Measurements Length: (cm) 3 Width: (cm) 2.5 Depth: (cm) 0.1 Area: (cm) 5.89 Volume: (cm) 0.589 % Reduction in Area: -1398.7% % Reduction in Volume: -1410.3% Epithelialization: None Tunneling: No Undermining: No Wound Description Full Thickness Without Exposed Classification: Support Structures Wound Margin: Distinct, outline attached Exudate Large Amount: Exudate Type: Serous Exudate Color: amber Foul Odor After Cleansing: No Slough/Fibrino Yes Wound Bed Granulation Amount: Medium (34-66%) Loveland, Onedia J. ([id number]) Granulation Quality:  Red Necrotic Amount: Medium (34-66%) Necrotic Quality: Adherent Slough Periwound Skin Texture Texture Color No Abnormalities Noted: No No Abnormalities Noted: No Moisture Temperature / Pain No Abnormalities Noted: No Temperature: No Abnormality Maceration: Yes Tenderness on Palpation: Yes Wound Preparation Ulcer Cleansing: Rinsed/Irrigated with Saline, Other: soap and water, Topical Anesthetic Applied: Other: lidocaine 4%, Treatment Notes Wound #3 (Left, Medial Lower Leg) 1. Cleansed with: Clean wound with Normal Saline Cleanse wound with antibacterial soap and water 2. Anesthetic Topical Lidocaine 4% cream to wound bed prior to debridement 4. Dressing Applied: Hydrafera Blue 5. Secondary Dressing Applied ABD Pad 7. Secured with Tape Notes kerlix, coban, unna to Ecologist) Signed: 05/19/2017 5:14:09 PM By: [name] Entered By: [name] on 05/19/2017 14:52:40 Wagster, Gardiner Hopkins ([id number]) -------------------------------------------------------------------------------- Wound Assessment Details [name] Date of Service: 05/19/2017 2:30 PM Medical Record Patient Account Number: [id number] Number: Treating RN: Phillis Hopkins [birth year] (83 y.o. Other Clinician: Date of Birth/Sex: Female) Treating [name] Primary Care Kale Dols: Jordan Begin Salene Mohamud/Extender: G Referring Maguire Sime: Jordan Begin Weeks in Treatment: 14 Wound Status Wound Number: 4 Primary Venous Leg Ulcer Etiology: Wound Location: Right Lower Leg - Medial Wound Status: Open Wounding Event: Gradually Appeared Comorbid Cataracts, Arrhythmia, Hypertension, Date Acquired: 02/16/2017 History: Osteoarthritis Weeks Of Treatment: 13 Clustered Wound: No Photos Photo Uploaded By: [name] on 05/19/2017 16:49:11 Wound Measurements Length: (cm) 3.7 Width: (cm) 4.2 Depth: (cm) 0.1 Area: (cm) 12.205 Volume: (cm) 1.221 %  Reduction in Area: -7673.9% % Reduction in Volume: -7531.2% Epithelialization: None Tunneling: No Undermining: No Wound Description Full Thickness Without Exposed Classification: Support Structures Wound Margin: Distinct, outline attached Exudate Large Amount: Exudate Type: Serosanguineous Exudate Color: red, brown Foul Odor After Cleansing: No Slough/Fibrino Yes Wound Bed Granulation Amount: None Present (0%) Aydt, Fatema J. ([id number]) Necrotic Amount: Large (67-100%) Necrotic Quality: Adherent Slough Periwound Skin Texture Texture Color No Abnormalities Noted: No No Abnormalities Noted: No Moisture Temperature / Pain No Abnormalities Noted: No Temperature: No Abnormality Maceration: Yes Tenderness on Palpation: Yes Wound Preparation Ulcer Cleansing: Rinsed/Irrigated with Saline, Other: soap and water, Topical Anesthetic Applied: Other: lidocaine 4%, Treatment Notes Wound #4 (Right, Medial Lower Leg) 1. Cleansed with: Clean wound with Normal Saline Cleanse wound with antibacterial soap and water 2. Anesthetic Topical Lidocaine 4% cream to wound bed prior to debridement 4. Dressing Applied: Hydrafera Blue 5. Secondary Dressing Applied ABD Pad 7. Secured with Tape Notes kerlix, coban, unna to Ecologist) Signed: 05/19/2017 5:14:09 PM By: [name] Entered By: [name] on 05/19/2017 14:51:32 Lukasik, Gardiner Hopkins ([id number]) -------------------------------------------------------------------------------- Wound Assessment Details [name] Date of Service: 05/19/2017 2:30 PM Medical Record Patient Account Number: [id number] Number: Treating RN: Phillis Hopkins 1931/09/05 (83 y.o. Other Clinician: Date of Birth/Sex: Female) Treating [name] Primary Care Makailee Nudelman: Jordan Begin Deby Adger/Extender: G Referring Fujie Dickison: Jordan Begin Weeks in Treatment: 14 Wound Status Wound Number: 5  Primary Venous Leg Ulcer Etiology: Wound Location: Left Lower Leg -  Anterior Wound Status: Open Wounding Event: Gradually Appeared Comorbid Cataracts, Arrhythmia, Hypertension, Date Acquired: 05/12/2017 History: Osteoarthritis Weeks Of Treatment: 1 Clustered Wound: No Photos Photo Uploaded By: [name] on 05/19/2017 16:49:12 Wound Measurements Length: (cm) 0.2 Width: (cm) 0.2 Depth: (cm) 0.1 Area: (cm) 0.031 Volume: (cm) 0.003 % Reduction in Area: 97.4% % Reduction in Volume: 97.5% Epithelialization: None Tunneling: No Undermining: No Wound Description Classification: Partial Thickness Foul Odor Aft Wound Margin: Distinct, outline attached Slough/Fibrin Exudate Amount: Large Exudate Type: Serous Exudate Color: amber er Cleansing: No o Yes Wound Bed Granulation Amount: Large (67-100%) Granulation Quality: Red Necrotic Amount: None Present (0%) Veith, Jordan J. ([id number]) Periwound Skin Texture Texture Color No Abnormalities Noted: No No Abnormalities Noted: No Moisture Temperature / Pain No Abnormalities Noted: No Temperature: No Abnormality Maceration: Yes Tenderness on Palpation: Yes Wound Preparation Ulcer Cleansing: Rinsed/Irrigated with Saline, Other: soap and water, Topical Anesthetic Applied: Other: lidocaine 4%, Treatment Notes Wound #5 (Left, Anterior Lower Leg) 1. Cleansed with: Clean wound with Normal Saline Cleanse wound with antibacterial soap and water 2. Anesthetic Topical Lidocaine 4% cream to wound bed prior to debridement 4. Dressing Applied: Hydrafera Blue 5. Secondary Dressing Applied ABD Pad 7. Secured with Tape Notes kerlix, coban, unna to Ecologist) Signed: 05/19/2017 5:14:09 PM By: [name] Entered By: [name] on 05/19/2017 14:52:01 Spera, Gardiner Hopkins ([id number]) -------------------------------------------------------------------------------- Vitals Details [name] Date of Service: 05/19/2017 2:30 PM Medical Record Patient Account Number: [id number] Number: Treating RN: Phillis Hopkins 09/15/1931 (83 y.o. Other Clinician: Date of Birth/Sex: Female) Treating [name] Primary Care Rikia Sukhu: Jordan Begin Vipul Cafarelli/Extender: G Referring Cynithia Hakimi: Jordan Begin Weeks in Treatment: 14 Vital Signs Time Taken: 14:35 Temperature (F): 98.2 Height (in): 66 Pulse (bpm): 76 Weight (lbs): 159 Respiratory Rate (breaths/min): 16 Body Mass Index (BMI): 25.7 Blood Pressure (mmHg): 131/62 Reference Range: 80 - 120 mg / dl Electronic Signature(s) Signed: 05/19/2017 5:14:09 PM By: [name] Entered By: [name] on 05/19/2017 14:34:58

## 2017-05-20 NOTE — Progress Notes (Signed)
ADLER, ALTON ([id number]) Visit Report for 05/19/2017 Debridement Details [name], [name]. Date of Service: 05/19/2017 2:30 PM Medical Record Patient Account Number: [id number] Number: Treating RN: [name] March 28, 1931 (83 y.o. Other Clinician: Date of Birth/Sex: Female) Treating Zen Cedillos Primary Care Provider: Guy Begin Provider/Extender: G Referring Provider: [name] in Treatment: 14 Debridement Performed for Wound #2 Right,Lateral Malleolus Assessment: Performed By: Physician Maxwell Caul, MD Debridement: Debridement Severity of Tissue Pre Fat layer exposed Debridement: Pre-procedure Verification/Time Out Yes - 15:09 Taken: Start Time: 15:10 Pain Control: Lidocaine 4% Topical Solution Level: Skin/Subcutaneous Tissue Total Area Debrided (L x 2.9 (cm) x 1.5 (cm) = 4.35 (cm) W): Tissue and other Viable, Non-Viable, Exudate, Fibrin/Slough, Subcutaneous material debrided: Instrument: Curette Bleeding: Minimum Hemostasis Achieved: Pressure End Time: 15:12 Procedural Pain: 0 Post Procedural Pain: 0 Response to Treatment: Procedure was tolerated well Post Debridement Measurements of Total Wound Length: (cm) 2.9 Width: (cm) 1.5 Depth: (cm) 0.1 Volume: (cm) 0.342 Character of Wound/Ulcer Post Requires Further Debridement Debridement: Severity of Tissue Post Debridement: Fat layer exposed Post Procedure Diagnosis Same as Pre-procedure EDINA, WINNINGHAM ([id number]) Electronic Signature(s) Signed: 05/19/2017 4:31:20 PM By: Baltazar Najjar MD Signed: 05/19/2017 5:14:09 PM By: [name] Entered By: Baltazar Najjar on 05/19/2017 15:50:53 Radcliffe, [name] ([id number]) -------------------------------------------------------------------------------- Debridement Details [name] Date of Service: 05/19/2017 2:30 PM Medical Record Patient Account Number:  [id number] Number: Treating RN: [name] 1931/05/22 (83 y.o. Other Clinician: Date of Birth/Sex: Female) Treating Kylee Nardozzi Primary Care Provider: Guy Begin Provider/Extender: G Referring Provider: [name] in Treatment: 14 Debridement Performed for Wound #3 Left,Medial Lower Leg Assessment: Performed By: Physician Maxwell Caul, MD Debridement: Debridement Severity of Tissue Pre Fat layer exposed Debridement: Pre-procedure Verification/Time Out Yes - 15:09 Taken: Start Time: 15:13 Pain Control: Lidocaine 4% Topical Solution Level: Skin/Subcutaneous Tissue Total Area Debrided (L x 3 (cm) x 2.5 (cm) = 7.5 (cm) W): Tissue and other Viable, Non-Viable, Exudate, Fibrin/Slough, Subcutaneous material debrided: Instrument: Curette Bleeding: Minimum Hemostasis Achieved: Pressure End Time: 15:15 Procedural Pain: 0 Post Procedural Pain: 0 Response to Treatment: Procedure was tolerated well Post Debridement Measurements of Total Wound Length: (cm) 3 Width: (cm) 2.5 Depth: (cm) 0.1 Volume: (cm) 0.589 Character of Wound/Ulcer Post Requires Further Debridement Debridement: Severity of Tissue Post Debridement: Fat layer exposed Post Procedure Diagnosis Same as Pre-procedure Electronic Signature(s) NIKIYA, STARN ([id number]) Signed: 05/19/2017 4:31:20 PM By: Baltazar Najjar MD Signed: 05/19/2017 5:14:09 PM By: [name] Entered By: Baltazar Najjar on 05/19/2017 15:51:02 Embleton, [name] ([id number]) -------------------------------------------------------------------------------- Debridement Details [name] Date of Service: 05/19/2017 2:30 PM Medical Record Patient Account Number: [id number] Number: Treating RN: [name] 29-Sep-1931 (83 y.o. Other Clinician: Date of Birth/Sex: Female) Treating Alabama Doig Primary Care Provider: Guy Begin Provider/Extender: G Referring  Provider: [name] in Treatment: 14 Debridement Performed for Wound #4 Right,Medial Lower Leg Assessment: Performed By: Physician Maxwell Caul, MD Debridement: Debridement Severity of Tissue Pre Fat layer exposed Debridement: Pre-procedure Verification/Time Out Yes - 15:09 Taken: Start Time: 15:12 Pain Control: Lidocaine 4% Topical Solution Level: Skin/Subcutaneous Tissue Total Area Debrided (L x 2 (cm) x 2 (cm) = 4 (cm) W): Tissue and other Viable, Non-Viable, Exudate, Fibrin/Slough, Subcutaneous material debrided: Instrument: Curette Bleeding: Minimum Hemostasis Achieved: Pressure End Time: 15:13 Procedural Pain: 0 Post Procedural Pain: 0 Response to Treatment: Procedure was tolerated well Post Debridement Measurements of Total Wound Length: (cm) 3.7 Width: (  cm) 4.2 Depth: (cm) 0.1 Volume: (cm) 1.221 Character of Wound/Ulcer Post Requires Further Debridement Debridement: Severity of Tissue Post Debridement: Fat layer exposed Post Procedure Diagnosis Same as Pre-procedure Electronic Signature(s) KIMBERLYNN, LUMBRA ([id number]) Signed: 05/19/2017 4:31:20 PM By: Baltazar Najjar MD Signed: 05/19/2017 5:14:09 PM By: [name] Entered By: Baltazar Najjar on 05/19/2017 15:51:21 Coberly, [name] ([id number]) -------------------------------------------------------------------------------- HPI Details [name] Date of Service: 05/19/2017 2:30 PM Medical Record Patient Account Number: [id number] Number: Treating RN: [name] February 03, 1931 (83 y.o. Other Clinician: Date of Birth/Sex: Female) Treating Kla Bily Primary Care Provider: Guy Begin Provider/Extender: G Referring Provider: Guy Begin Weeks in Treatment: 14 History of Present Illness HPI Description: 06/10/16; this is an 82 year old woman who lives near [place] with family members. She saw her primary doctor roughly over a week ago and was  referred to the ER for a cellulitis in the right medial leg. She was given a round of IV antibiotics in the emergency room and discharged on oral Keflex which she is taking. She is been left with a uncomfortable leg area on the right medial lower leg. Her ABIs in this clinic were 0.8 on the right 0.6 on the left. She does not describe claudication. She is not a diabetic. Lab work in the ER showed a normal BUN and creatinine on 6/20 at 18 and 0.92 respectively white count was 10.1 hemoglobin at 13.8. She did not have any imaging studies. I can see no vascular evaluations in Epic. She does not have a prior wound history 06/17/16; the area affected on her right medial ankle is an area I thought was due to tissue damage from cellulitis last week. She is going for venous reflux studies this afternoon, these were not ordered in this clinic at least not by me. The patient does not give a prior history of damage to the skin in this area although I wonder if she actually might not of noticed it. We have been using's Aquacel Ag under a wrap although we will not be able to wrap her today 06/24/16: pt reports today she hasn't heard from the vascular clinic regarding proceeding with arterial studies. our office will assist with this today. she denies s/s of infection. denies problems with her wraps. 07/07/16; since I last saw this woman she was admitted to hospital from 7/16 through 7/18 with sepsis syndrome felt to be secondary to her ulcer. She was treated with Vanco and Zosyn the hospital discharged on Septra. Blood cultures were negative. Urine culture showed multiple species she is now feeling well using Santyl with border foam to her wound. [facility] has [facility] 07/14/16; no major improvement here. She has a quarter-sized wound covered with thick adherent slough on the lateral aspect of her right leg with several surrounding satellite lesions in a similar state. She has had 2 recent bouts of  cellulitis, one before she came to this clinic and one required a recent hospitalization. I have asked for arterial studies I don't believe these have ever been done. My notes suggest from early in July that she went for reflux studies although I don't know that I have ever seen these either, she did have a DVT rule out but I did not order this, this did not show a DVT but did show a Baker's cyst 07/21/2016 -- the patient had an arterial duplex study done with waveform suggestive of right femoropopliteal disease. There is also small vessel disease bilaterally. Right ABI was in  the normal range of 1.0 and a TBI was 0.74. Left ABI is abnormal at 0.69 with a TBI of 0.40. Three-vessel runoff on the right and a two-vessel runoff on the left. She has an appointment to see Dr. Kirke Corin later today. 07/28/2016 -- was seen by Dr. Kirke Corin who reviewed her arterial studies with an ABI of 0.94 on the right and 0.69 on the left with normal right toe brachial index. Duplex showed diffuse atherosclerosis without discrete Pla, Chrislyn J. ([id number]) stenosis and there was a three-vessel runoff on the right below the knee. Based on these studies and the location of the ulcer he thought it was venous commended she continue with wound care consults. 08/04/16; I note the patient's reasonably functional arterial supply which is fortunate. The wound bed appears to be smaller. Debrided of surface slough. She is been using Iodoflex 08/11/16: Only a very small open area remains. However it has some depth 08/18/16; small open area is smaller. Still open however. 08/25/16; the area has totally closed down and epithelialized. READMISSION 02/09/17; this is an 83 year old woman who is not a diabetic or smoker. She was in our clinic in the summer into mid-September with a wound on her right medial calf that. This eventually closed down. I thought this was mostly venous. She did have evidence of PAD and she was seen by Dr. Kirke Corin. At that  point she had an ABI of 0.94 on the right 0.69 on the left with a normal right toe brachial index. It was felt that she had enough blood flow to close her wound and that happened. She was discharged with stockings although I don't get the sense that she really use them she tells me that roughly a week or 2 she developed swelling in her bilateral lower legs and then noted wounds on her right lateral malleolus and left medial calf. The on this she is not really sure how this happened. She is not having a lot of pain although she is not ambulatory that much only in her home. Other than weight loss she has not noticed any other issues no specific claudication no chest pain. 02/16/17; the patient continues with 2 wounds on the right lateral malleolus and left medial calf. Both of these are painful. She has chronic venous insufficiency but also known PAD and is previously seen Dr. Kirke Corin. We have arranged vascular follow-up with Dr. Kirke Corin to look at the arterial situation 02/25/2017 -- patient was brought in today because she missed her appointment last week with Dr. Leanord Hawking and is having a lot of pain and drainage from her wounds. The interim she was seen by Dr. Bascom Levels who did a duplex of the right lower extremity which shows a right ABI of 0.95 and left ABI 0.74. There was also a abdominal aortogram with a bilateral extremity angiography done for significant peripheral vascular disease. The right lower extremity showed diffuse SFA and popliteal disease with significant stenosis affecting the TP trunk just before the origin of the peritoneal artery. The left lower extremity showed moderate left SFA stenosis with occluded popliteal artery and tibioperoneal vessels with only visible collateral. The opinion was the patient had no endovascular surgical revascularization options of the left lower extremity and Dr. Bascom Levels would discuss it with Dr. Randie Heinz for a second opinion. Endovascular intervention on the  right TP trunk could be considered. 03/03/17; the patient arrives back today having last seen Dr. Meyer Russel. As noted she has severe PAD which is not amenable to either bypass  surgery or endovascular interventions on the left. Endovascular interventions in the right TP trunk could be considered. She has large wounds on the right medial leg and a sizable wound on the right lateral leg. Small wound on the left medial leg. She is really in a lot of discomfort. Using Santyl on the right and I believe Prisma on the left. 03/10/17; the patient arrives today again with large wounds on the right lateral lower extremity, right medial lower extremity. And a small punched out wound on the left medial lower extremity. These are in the setting of severe non-revascularizable PAD. She continues to complain of discomfort although I don't think right now this is unmanageable. We have been using Santyl. We are arranging getting her home health [[facility]] 03/16/17- patient is here for follow-up evaluation of her bilateral lower extremity ulcers. She has an appointment on 4/11 for an arth[facility] per [facility] She is continuing to [place]antyl. Her son is doing her dressing changes 03/30/17 still using santyl. sincer her last visit she was admitted from 4/11-4/12 for arthrectomy and balloon angioplasty of the right distal popliteal artery with TP trunk and peritoneal arteries. she is on dual antiplatelet Jordan BandaFULLER, Shanigua J. ([id number]) and states her pain is somewhat better on the right 04/13/17 still using santyl to all wounds. Area on the right medial and left lateral look better. Still difficulties with the right lateral wouind. Combination of arterial insufficiency and Venous stasis No revascularization options on the left. Possible interventions on the right (see above) 04/20/17; the patient is still using Santyl to all wound areas. We've had some improvement in the right medial and left lateral. Patient  has a combination of arterial and venous insufficiency. No revascularization options are available to the patient on the left however possible interventions on the right. 04/27/17; I continued using Santyl to all wound areas. We've had improvement in the right medial wound and the left lateral wound. The right lateral lower leg wound has remained static. She arrives today with a lot more edema in her bilateral legs this is pitting. She does not complain of chest pain or shortness of breath tells me she has no prior cardiac issues. 05/12/17; I been using Santyl to all of this lady's wounds. She has an appointment with Dr. Kirke CorinArida next week. The other issue this week is that apparently her insurance and [facility] are no longer under contract though there was some suggestion that there is another home health company, we can't really determine who that was today. We change the primary dressing to all the wounds [facility] 05/19/17; patient's appointment with Dr. Kirke CorinArida is actually next week. We have been using Hydrofera Blue to her wounds Electronic Signature(s) Signed: 05/19/2017 4:31:20 PM By: Baltazar Najjarobson, Jordon Kristiansen MD Entered By: Baltazar Najjarobson, Flynt Breeze on 05/19/2017 15:52:18 Linford, [name] ([id number]) -------------------------------------------------------------------------------- Physical Exam Details Patient Name: Jordan BandaFULLER, Dharma J. Date of Service: 05/19/2017 2:30 PM Medical Record Patient Account Number: [id number] Number: Treating RN: Phillis Haggisinkerton, Debi Mar 17, 1931 (83 y.o. Other Clinician: Date of Birth/Sex: Female) Treating Tkeyah Burkman Primary Care Provider: [name], NARGES Provider/Extender: G Referring Provider: [name], NARGES Weeks in Treatment: 14 Constitutional Sitting or standing Blood Pressure is within target range for patient.. Pulse regular and within target range for patient.Marland Kitchen. Respirations regular, non-labored and within target range.. Temperature is normal and  within the target range for the patient.Marland Kitchen. appears in no distress. Notes Wound exam; the area on her right medial leg and left medial leg continued  to improve. The area on the right lateral leg still has a lot of drainage. All wounds were debrided with a #3 curet to remove necrotic surface. She tolerates this well. In spite of her known PAD she continues to make quite nice progress Electronic Signature(s) Signed: 05/19/2017 4:31:20 PM By: Baltazar Najjar MD Entered By: Baltazar Najjar on 05/19/2017 15:53:43 Kalbfleisch, [name] ([id number]) -------------------------------------------------------------------------------- Physician Orders Details [name] Date of Service: 05/19/2017 2:30 PM Medical Record Patient Account Number: [id number] Number: Treating RN: [name] 03/15/1931 (83 y.o. Other Clinician: Date of Birth/Sex: Female) Treating Eilah Common Primary Care Provider: Guy Begin Provider/Extender: G Referring Provider: [name] in Treatment: 14 Verbal / Phone Orders: Yes Clinician: Ashok Cordia, Debi Read Back and Verified: Yes Diagnosis Coding Wound Cleansing Wound #2 Right,Lateral Malleolus o Clean wound with Normal Saline. o Cleanse wound with mild soap and water Wound #3 Left,Medial Lower Leg o Clean wound with Normal Saline. o Cleanse wound with mild soap and water Wound #4 Right,Medial Lower Leg o Clean wound with Normal Saline. o Cleanse wound with mild soap and water Wound #5 Left,Anterior Lower Leg o Clean wound with Normal Saline. o Cleanse wound with mild soap and water Anesthetic Wound #2 Right,Lateral Malleolus o Topical Lidocaine 4% cream applied to wound bed prior to debridement - clinic use only Wound #3 Left,Medial Lower Leg o Topical Lidocaine 4% cream applied to wound bed prior to debridement - clinic use only Wound #4 Right,Medial Lower Leg o Topical Lidocaine 4% cream applied to  wound bed prior to debridement - clinic use only Wound #5 Left,Anterior Lower Leg o Topical Lidocaine 4% cream applied to wound bed prior to debridement - clinic use only Primary Wound Dressing Wound #2 Right,Lateral Malleolus o Hydrafera Blue Wound #3 Left,Medial Lower Leg Kobler, Madelynne J. ([id number]) o Hydrafera Blue Wound #4 Right,Medial Lower Leg o Hydrafera Blue Wound #5 Left,Anterior Lower Leg o Hydrafera Blue Secondary Dressing Wound #2 Right,Lateral Malleolus o ABD pad o Drawtex Wound #3 Left,Medial Lower Leg o ABD pad Wound #4 Right,Medial Lower Leg o ABD pad Wound #5 Left,Anterior Lower Leg o ABD pad Dressing Change Frequency Wound #2 Right,Lateral Malleolus o Change dressing every week Wound #3 Left,Medial Lower Leg o Change dressing every week Wound #4 Right,Medial Lower Leg o Change dressing every week Wound #5 Left,Anterior Lower Leg o Change dressing every week Follow-up Appointments Wound #2 Right,Lateral Malleolus o Return Appointment in 1 week. Wound #3 Left,Medial Lower Leg o Return Appointment in 1 week. Wound #4 Right,Medial Lower Leg o Return Appointment in 1 week. Wound #5 Left,Anterior Lower Leg o Return Appointment in 1 week. CHANDLAR, STAEBELL ([id number]) Edema Control Wound #2 Right,Lateral Malleolus o Kerlix and Coban - Bilateral - wrap lightly unna to anchor o Elevate legs to the level of the heart and pump ankles as often as possible Wound #3 Left,Medial Lower Leg o Kerlix and Coban - Bilateral - wrap lightly unna to anchor o Elevate legs to the level of the heart and pump ankles as often as possible Wound #4 Right,Medial Lower Leg o Kerlix and Coban - Bilateral - wrap lightly unna to anchor o Elevate legs to the level of the heart and pump ankles as often as possible Wound #5 Left,Anterior Lower Leg o Kerlix and Coban - Bilateral - wrap lightly unna to anchor o Elevate legs to  the level of the heart and pump ankles as often as possible Additional Orders / Instructions Wound #2  Right,Lateral Malleolus o Increase protein intake. Wound #3 Left,Medial Lower Leg o Increase protein intake. Wound #4 Right,Medial Lower Leg o Increase protein intake. Wound #5 Left,Anterior Lower Leg o Increase protein intake. Electronic Signature(s) Signed: 05/19/2017 4:31:20 PM By: Baltazar Najjar MD Signed: 05/19/2017 5:14:09 PM By: [name] Entered By: [name] on 05/19/2017 15:32:48 Thorley, [name] ([id number]) -------------------------------------------------------------------------------- Problem List Details Patient Name: SULAY, BRYMER. Date of Service: 05/19/2017 2:30 PM Medical Record Patient Account Number: [id number] Number: Treating RN: [name] Jan 03, 1931 (83 y.o. 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 L97.221 Non-pressure chronic ulcer of left calf limited to 02/09/2017 Yes breakdown of skin I70.233 Atherosclerosis of native arteries of right leg with 02/25/2017 Yes ulceration of ankle I70.243 Atherosclerosis of native arteries of left leg with ulceration 02/25/2017 Yes of ankle Inactive Problems Resolved Problems Electronic Signature(s) Signed: 05/19/2017 4:31:20 PM By: Baltazar Najjar MD Entered By: Baltazar Najjar on 05/19/2017 15:50:32 Trostle, [name] ([id number]) -------------------------------------------------------------------------------- Progress Note Details [name] Date of Service: 05/19/2017 2:30 PM Medical Record Patient Account Number: [id number] Number: Treating RN: [name] [birth year] (83 y.o. Other Clinician: Date of Birth/Sex: Female) Treating Evolette Pendell Primary Care Provider: Guy Begin Provider/Extender: G Referring Provider: Guy Begin Weeks in Treatment: 14 Subjective History of Present Illness (HPI) 06/10/16; this is an 83 year old woman who lives near [place] with family members. She saw her primary doctor roughly over a week ago and was referred to the ER for a cellulitis in the right medial leg. She was given a round of IV antibiotics in the emergency room and discharged on oral Keflex which she is taking. She is been left with a uncomfortable leg area on the right medial lower leg. Her ABIs in this clinic were 0.8 on the right 0.6 on the left. She does not describe claudication. She is not a diabetic. Lab work in the ER showed a normal BUN and creatinine on 6/20 at 18 and 0.92 respectively white count was 10.1 hemoglobin at 13.8. She did not have any imaging studies. I can see no vascular evaluations in Epic. She does not have a prior wound history 06/17/16; the area affected on her right medial ankle is an area I thought was due to tissue damage from cellulitis last week. She is going for venous reflux studies this afternoon, these were not ordered in this clinic at least not by me. The patient does not give a prior history of damage to the skin in this area although I wonder if she actually might not of noticed it. We have been using's Aquacel Ag under a wrap although we will not be able to wrap her today 06/24/16: pt reports today she hasn't heard from the vascular clinic regarding proceeding with arterial studies. our office will assist with this today. she denies s/s of infection. denies problems with her wraps. 07/07/16; since I last saw this woman she was admitted to hospital from 7/16 through 7/18  with sepsis syndrome felt to be secondary to her ulcer. She was treated with Vanco and Zosyn the hospital discharged on Septra. Blood cultures were negative. Urine culture showed multiple species she is now feeling  well using Santyl with border foam to her wound. She has [facility] 07/14/16; no major improvement here. She has a quarter-sized wound covered with thick adherent slough on the lateral aspect of her right leg with several surrounding satellite lesions in a similar state. She has had 2 recent bouts of cellulitis, one before she came to this clinic and one required a recent hospitalization. I have asked for arterial studies I don't believe these have ever been done. My notes suggest from early in July that she went for reflux studies although I don't know that I have ever seen these either, she did have a DVT rule out but I did not order this, this did not show a DVT but did show a Baker's cyst 07/21/2016 -- the patient had an arterial duplex study done with waveform suggestive of right femoropopliteal disease. There is also small vessel disease bilaterally. Right ABI was in the normal range of 1.0 and a TBI was 0.74. Left ABI is abnormal at 0.69 with a TBI of 0.40. Three-vessel runoff on the right and a two-vessel runoff on the left. She has an appointment to see Dr. Kirke Corin later today. 07/28/2016 -- was seen by Dr. Kirke Corin who reviewed her arterial studies with an ABI of 0.94 on the right and Kinoshita, Anara J. ([id number]) 0.69 on the left with normal right toe brachial index. Duplex showed diffuse atherosclerosis without discrete stenosis and there was a three-vessel runoff on the right below the knee. Based on these studies and the location of the ulcer he thought it was venous commended she continue with wound care consults. 08/04/16; I note the patient's reasonably functional arterial supply which is fortunate. The wound bed appears to be smaller. Debrided of surface slough. She  is been using Iodoflex 08/11/16: Only a very small open area remains. However it has some depth 08/18/16; small open area is smaller. Still open however. 08/25/16; the area has totally closed down and epithelialized. READMISSION 02/09/17; this is an 83 year old woman who is not a diabetic or smoker. She was in our clinic in the summer into mid-September with a wound on her right medial calf that. This eventually closed down. I thought this was mostly venous. She did have evidence of PAD and she was seen by Dr. Kirke Corin. At that point she had an ABI of 0.94 on the right 0.69 on the left with a normal right toe brachial index. It was felt that she had enough blood flow to close her wound and that happened. She was discharged with stockings although I don't get the sense that she really use them she tells me that roughly a week or 2 she developed swelling in her bilateral lower legs and then noted wounds on her right lateral malleolus and left medial calf. The on this she is not really sure how this happened. She is not having a lot of pain although she is not ambulatory that much only in her home. Other than weight loss she has not noticed any other issues no specific claudication no chest pain. 02/16/17; the patient continues with 2 wounds on the right lateral malleolus and left medial calf. Both of these are painful. She has chronic venous insufficiency but also known PAD and is previously seen Dr. Kirke Corin. We have arranged vascular follow-up with Dr. Kirke Corin to look at the arterial situation 02/25/2017 -- patient was brought in today because she missed her appointment last week with Dr. Leanord Hawking and is having a lot of pain and  drainage from her wounds. The interim she was seen by Dr. Bascom Levels who did a duplex of the right lower extremity which shows a right ABI of 0.95 and left ABI 0.74. There was also a abdominal aortogram with a bilateral extremity angiography done for significant peripheral vascular  disease. The right lower extremity showed diffuse SFA and popliteal disease with significant stenosis affecting the TP trunk just before the origin of the peritoneal artery. The left lower extremity showed moderate left SFA stenosis with occluded popliteal artery and tibioperoneal vessels with only visible collateral. The opinion was the patient had no endovascular surgical revascularization options of the left lower extremity and Dr. Bascom Levels would discuss it with Dr. Randie Heinz for a second opinion. Endovascular intervention on the right TP trunk could be considered. 03/03/17; the patient arrives back today having last seen Dr. Meyer Russel. As noted she has severe PAD which is not amenable to either bypass surgery or endovascular interventions on the left. Endovascular interventions in the right TP trunk could be considered. She has large wounds on the right medial leg and a sizable wound on the right lateral leg. Small wound on the left medial leg. She is really in a lot of discomfort. Using Santyl on the right and I believe Prisma on the left. 03/10/17; the patient arrives today again with large wounds on the right lateral lower extremity, right medial lower extremity. And a small punched out wound on the left medial lower extremity. These are in the setting of severe non-revascularizable PAD. She continues to complain of discomfort although I don't think right now this is unmanageable. We have been using Santyl. We are arranging getting her home health [[facility]] 03/16/17- patient is here for follow-up evaluation of her bilateral lower extremity ulcers. She has an appointment on 4/11 for an arthrectomy per Dr. Kirke Corin at [facility]. She is continuing to [place]. Her son is doing her dressing changes 03/30/17 still using santyl. sincer her last visit she was admitted from 4/11-4/12 for arthrectomy and balloon MADLYN, CROSBY. ([id number]) angioplasty of the right distal popliteal artery with TP trunk  and peritoneal arteries. she is on dual antiplatelet and states her pain is somewhat better on the right 04/13/17 still using santyl to all wounds. Area on the right medial and left lateral look better. Still difficulties with the right lateral wouind. Combination of arterial insufficiency and Venous stasis No revascularization options on the left. Possible interventions on the right (see above) 04/20/17; the patient is still using Santyl to all wound areas. We've had some improvement in the right medial and left lateral. Patient has a combination of arterial and venous insufficiency. No revascularization options are available to the patient on the left however possible interventions on the right. 04/27/17; I continued using Santyl to all wound areas. We've had improvement in the right medial wound and the left lateral wound. The right lateral lower leg wound has remained static. She arrives today with a lot more edema in her bilateral legs this is pitting. She does not complain of chest pain or shortness of breath tells me she has no prior cardiac issues. 05/12/17; I been using Santyl to all of this lady's wounds. She has an appointment with Dr. Kirke Corin next week. The other issue this week is that apparently her insurance and [facility] are no longer under contract though there was some suggestion that there is another home health company, we can't really determine who that was today. We change the primary dressing to  all the wounds to [facility] 05/19/17; patient's appointment with Dr. Kirke Corin is actually next week. We have been using Hydrofera Blue to her wounds Objective Constitutional Sitting or standing Blood Pressure is within target range for patient.. Pulse regular and within target range for patient.Marland Kitchen Respirations regular, non-labored and within target range.. Temperature is normal and within the target range for the patient.Marland Kitchen appears in no distress. Vitals Time Taken: 2:35 PM,  Height: 66 in, Weight: 159 lbs, BMI: 25.7, Temperature: 98.2 F, Pulse: 76 bpm, Respiratory Rate: 16 breaths/min, Blood Pressure: 131/62 mmHg. General Notes: Wound exam; the area on her right medial leg and left medial leg continued to improve. The area on the right lateral leg still has a lot of drainage. All wounds were debrided with a #3 curet to remove necrotic surface. She tolerates this well. In spite of her known PAD she continues to make quite nice progress Integumentary (Hair, Skin) Wound #2 status is Open. Original cause of wound was Gradually Appeared. The wound is located on the Right,Lateral Malleolus. The wound measures 2.9cm length x 1.5cm width x 0.1cm depth; 3.416cm^2 area and 0.342cm^3 volume. There is no tunneling or undermining noted. There is a large amount of serous drainage noted. The wound margin is distinct with the outline attached to the wound base. There is medium (34-66%) red granulation within the wound bed. There is a medium (34-66%) amount of necrotic tissue within the wound bed including Adherent Slough. The periwound skin appearance exhibited: Maceration. Periwound temperature was noted as No Abnormality. The periwound has tenderness on palpation. JURLINE, FOLGER ([id number]) Wound #3 status is Open. Original cause of wound was Gradually Appeared. The wound is located on the Left,Medial Lower Leg. The wound measures 3cm length x 2.5cm width x 0.1cm depth; 5.89cm^2 area and 0.589cm^3 volume. There is no tunneling or undermining noted. There is a large amount of serous drainage noted. The wound margin is distinct with the outline attached to the wound base. There is medium (34-66%) red granulation within the wound bed. There is a medium (34-66%) amount of necrotic tissue within the wound bed including Adherent Slough. The periwound skin appearance exhibited: Maceration. Periwound temperature was noted as No Abnormality. The periwound has tenderness on  palpation. Wound #4 status is Open. Original cause of wound was Gradually Appeared. The wound is located on the Right,Medial Lower Leg. The wound measures 3.7cm length x 4.2cm width x 0.1cm depth; 12.205cm^2 area and 1.221cm^3 volume. There is no tunneling or undermining noted. There is a large amount of serosanguineous drainage noted. The wound margin is distinct with the outline attached to the wound base. There is no granulation within the wound bed. There is a large (67-100%) amount of necrotic tissue within the wound bed including Adherent Slough. The periwound skin appearance exhibited: Maceration. Periwound temperature was noted as No Abnormality. The periwound has tenderness on palpation. Wound #5 status is Open. Original cause of wound was Gradually Appeared. The wound is located on the Left,Anterior Lower Leg. The wound measures 0.2cm length x 0.2cm width x 0.1cm depth; 0.031cm^2 area and 0.003cm^3 volume. There is no tunneling or undermining noted. There is a large amount of serous drainage noted. The wound margin is distinct with the outline attached to the wound base. There is large (67-100%) red granulation within the wound bed. There is no necrotic tissue within the wound bed. The periwound skin appearance exhibited: Maceration. Periwound temperature was noted as No Abnormality. The periwound has tenderness on palpation. 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 There was a Skin/Subcutaneous Tissue Debridement ([id number]) debridement with total area of 4.35 sq cm performed by Maxwell Caul, MD. with the following instrument(s): Curette to remove Viable and Non-Viable tissue/material Steppe, Syrita J. ([id number]) including Exudate, Fibrin/Slough, and Subcutaneous after achieving pain control using Lidocaine 4% Topical Solution. A time out was conducted at 15:09, prior to the start of the procedure. A Minimum amount of bleeding was controlled with Pressure. The procedure was tolerated well with a pain level of 0 throughout and a pain level of 0 following the procedure. Post Debridement Measurements: 2.9cm length x 1.5cm width x 0.1cm depth; 0.342cm^3 volume. Character of Wound/Ulcer Post Debridement requires further debridement. Severity of Tissue Post Debridement is: Fat layer exposed. Post procedure Diagnosis Wound #2: Same as Pre-Procedure Wound #3 Pre-procedure diagnosis of Wound #3 is a Venous Leg Ulcer located on the Left,Medial Lower Leg .Severity of Tissue Pre Debridement is: Fat layer exposed. There was a Skin/Subcutaneous Tissue Debridement ([id number]) debridement with total area of 7.5 sq cm performed by Maxwell Caul, MD. with the following instrument(s): Curette to remove Viable and Non-Viable tissue/material including Exudate, Fibrin/Slough, and Subcutaneous after achieving pain control using Lidocaine 4% Topical Solution. A time out was conducted at 15:09, prior to the start of the procedure. A Minimum amount of bleeding was controlled with Pressure. The procedure was tolerated well with a pain level of 0 throughout and a pain level of 0 following the procedure. Post Debridement Measurements: 3cm length x 2.5cm width x 0.1cm depth; 0.589cm^3 volume. Character of Wound/Ulcer Post Debridement requires further debridement. Severity of Tissue  Post Debridement is: Fat layer exposed. Post procedure Diagnosis Wound #3: Same as Pre-Procedure Wound #4 Pre-procedure diagnosis of Wound #4 is a Venous Leg Ulcer located on the Right,Medial Lower Leg .Severity of Tissue Pre Debridement is: Fat layer exposed. There was a Skin/Subcutaneous Tissue Debridement ([id number]) debridement with total area of 4 sq cm performed by Maxwell Caul, MD. with the following instrument(s): Curette to remove Viable and Non-Viable tissue/material including Exudate, Fibrin/Slough, and Subcutaneous after achieving pain control using Lidocaine 4% Topical Solution. A time out was conducted at 15:09, prior to the start of the procedure. A Minimum amount of bleeding was controlled with Pressure. The procedure was tolerated well with a pain level of 0 throughout and a pain level of 0 following the procedure. Post Debridement Measurements: 3.7cm length x 4.2cm width x 0.1cm depth; 1.221cm^3 volume. Character of Wound/Ulcer Post Debridement requires further debridement. Severity of Tissue Post Debridement is: Fat layer exposed. Post procedure Diagnosis Wound #4: Same as Pre-Procedure Plan Wound Cleansing: Wound #2 Right,Lateral Malleolus: Clean wound with Normal Saline. Cleanse wound with mild soap and water Splitt, Clariza J. ([id number]) Wound #3 Left,Medial Lower Leg: Clean wound with Normal Saline. Cleanse wound with mild soap and water Wound #4 Right,Medial Lower Leg: Clean wound with Normal Saline. Cleanse wound with mild soap and water Wound #5 Left,Anterior Lower Leg:  Clean wound with Normal Saline. Cleanse wound with mild soap and water Anesthetic: Wound #2 Right,Lateral Malleolus: Topical Lidocaine 4% cream applied to wound bed prior to debridement - clinic use only Wound #3 Left,Medial Lower Leg: Topical Lidocaine 4% cream applied to wound bed prior to debridement - clinic use only Wound #4 Right,Medial Lower Leg: Topical Lidocaine 4% cream  applied to wound bed prior to debridement - clinic use only Wound #5 Left,Anterior Lower Leg: Topical Lidocaine 4% cream applied to wound bed prior to debridement - clinic use only Primary Wound Dressing: Wound #2 Right,Lateral Malleolus: Hydrafera Blue Wound #3 Left,Medial Lower Leg: Hydrafera Blue Wound #4 Right,Medial Lower Leg: Hydrafera Blue Wound #5 Left,Anterior Lower Leg: Hydrafera Blue Secondary Dressing: Wound #2 Right,Lateral Malleolus: ABD pad Drawtex Wound #3 Left,Medial Lower Leg: ABD pad Wound #4 Right,Medial Lower Leg: ABD pad Wound #5 Left,Anterior Lower Leg: ABD pad Dressing Change Frequency: Wound #2 Right,Lateral Malleolus: Change dressing every week Wound #3 Left,Medial Lower Leg: Change dressing every week Wound #4 Right,Medial Lower Leg: Change dressing every week Wound #5 Left,Anterior Lower Leg: Change dressing every week Follow-up Appointments: Wound #2 Right,Lateral Malleolus: Return Appointment in 1 week. Wound #3 Left,Medial Lower Leg: Return Appointment in 1 week. BREDA, BOND ([id number]) Wound #4 Right,Medial Lower Leg: Return Appointment in 1 week. Wound #5 Left,Anterior Lower Leg: Return Appointment in 1 week. Edema Control: Wound #2 Right,Lateral Malleolus: Kerlix and Coban - Bilateral - wrap lightly unna to anchor Elevate legs to the level of the heart and pump ankles as often as possible Wound #3 Left,Medial Lower Leg: Kerlix and Coban - Bilateral - wrap lightly unna to anchor Elevate legs to the level of the heart and pump ankles as often as possible Wound #4 Right,Medial Lower Leg: Kerlix and Coban - Bilateral - wrap lightly unna to anchor Elevate legs to the level of the heart and pump ankles as often as possible Wound #5 Left,Anterior Lower Leg: Kerlix and Coban - Bilateral - wrap lightly unna to anchor Elevate legs to the level of the heart and pump ankles as often as possible Additional Orders / Instructions: Wound  #2 Right,Lateral Malleolus: Increase protein intake. Wound #3 Left,Medial Lower Leg: Increase protein intake. Wound #4 Right,Medial Lower Leg: Increase protein intake. Wound #5 Left,Anterior Lower Leg: Increase protein intake. #1 I'm going to continue with the [facility]. #2 look forward to Dr. Jari Sportsman review next week 3 in spite of known PAD the patient appears to be making progress on the right lateral, right medial and left medial legs/caps Electronic Signature(s) Signed: 05/19/2017 4:31:20 PM By: Baltazar Najjar MD Entered By: Baltazar Najjar on 05/19/2017 15:55:15 Satz, [name] ([id number]) -------------------------------------------------------------------------------- SuperBill Details [name] Date of Service: 05/19/2017 Medical Record Patient Account Number: [id number] Number: Treating RN: [name] January 03, 1931 (83 y.o. 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 L97.221 Non-pressure chronic ulcer of left calf limited to breakdown of skin I70.233 Atherosclerosis of native arteries of right leg with ulceration of ankle I70.243 Atherosclerosis of native arteries of left leg with ulceration of ankle Facility Procedures CPT4 Code Description: [id number] 11042 - DEB SUBQ TISSUE 20 SQ CM/< ICD-10 Description Diagnosis L97.313 Non-pressure chronic ulcer of right ankle  with necrosi L97.221 Non-pressure chronic ulcer of left calf limited to bre Modifier: s of muscle akdown of skin Quantity: 1 Physician Procedures CPT4 Code Description: [id number] 11042 - WC PHYS SUBQ TISS 20 SQ  CM ICD-10 Description Diagnosis L97.313 Non-pressure chronic ulcer of right ankle with necrosi L97.221 Non-pressure chronic ulcer of left calf limited to bre Modifier: s of muscle akdown of skin Quantity: 1 Electronic Signature(s) Signed: 05/19/2017 4:31:20 PM By: Baltazar Najjar MD Entered By: Baltazar Najjar on 05/19/2017 15:55:54

## 2017-05-25 ENCOUNTER — Ambulatory Visit: Payer: Medicare Other | Admitting: Cardiovascular Disease

## 2017-05-26 ENCOUNTER — Encounter: Payer: Medicare Other | Admitting: Internal Medicine

## 2017-05-26 ENCOUNTER — Ambulatory Visit: Payer: Medicare Other | Admitting: Internal Medicine

## 2017-05-26 DIAGNOSIS — I87333 Chronic venous hypertension (idiopathic) with ulcer and inflammation of bilateral lower extremity: Secondary | ICD-10-CM | POA: Diagnosis not present

## 2017-05-27 ENCOUNTER — Other Ambulatory Visit
Admission: RE | Admit: 2017-05-27 | Discharge: 2017-05-27 | Disposition: A | Discharge status: Home / Self Care | Payer: Medicare Other | Source: Ambulatory Visit | Attending: Internal Medicine | Admitting: Internal Medicine

## 2017-05-27 DIAGNOSIS — B999 Unspecified infectious disease: Secondary | ICD-10-CM | POA: Insufficient documentation

## 2017-05-28 NOTE — Progress Notes (Signed)
RILIE, GLANZ ([id number]) Visit Report for 05/26/2017 Arrival Information Details [name], [name]. Date of Service: 05/26/2017 3:30 PM Medical Record Patient Account Number: [id number] Number: Treating RN: Huel Coventry May 04, 1931 (83 y.o. Other Clinician: Date of Birth/Sex: Female) Treating ROBSON, MICHAEL Primary Care Jaime Grizzell: Guy Begin Kemoni Ortega/Extender: G Referring Antjuan Rothe: [name] in Treatment: 15 Visit Information History Since Last Visit Added or deleted any medications: No Patient Arrived: Wheel Chair Any new allergies or adverse reactions: No Arrival Time: 14:56 Had a fall or experienced change in No Accompanied By: son activities of daily living that may affect Transfer Assistance: Manual risk of falls: Patient Identification Verified: Yes Signs or symptoms of abuse/neglect since last No Secondary Verification Process Yes visito Completed: Hospitalized since last visit: No Patient Requires Transmission- No Has Dressing in Place as Prescribed: Yes Based Precautions: Pain Present Now: No Patient Has Alerts: Yes Patient Alerts: Patient on Blood Thinner Electronic Signature(s) Signed: 05/26/2017 9:16:41 PM By: Elliot Gurney, BSN, RN, CWS, Kim RN, BSN Entered By: Elliot Gurney, BSN, RN, CWS, Kim on 05/26/2017 14:56:25 Haro, [name] ([id number]) -------------------------------------------------------------------------------- Clinic Level of Care Assessment Details [name] Date of Service: 05/26/2017 3:30 PM Medical Record Patient Account Number: [id number] Number: Treating RN: Huel Coventry 02-25-1931 (83 y.o. Other Clinician: Date of Birth/Sex: Female) Treating ROBSON, MICHAEL Primary Care Nechuma Boven: Guy Begin Kinzey Sheriff/Extender: G Referring Suede Greenawalt: [name] in Treatment: 15 Clinic Level of Care Assessment Items TOOL 4 Quantity Score []  - Use when only an EandM is performed on  FOLLOW-UP visit 0 ASSESSMENTS - Nursing Assessment / Reassessment []  - Reassessment of Co-morbidities (includes updates in patient status) 0 X - Reassessment of Adherence to Treatment Plan 1 5 ASSESSMENTS - Wound and Skin Assessment / Reassessment []  - Simple Wound Assessment / Reassessment - one wound 0 X - Complex Wound Assessment / Reassessment - multiple wounds 4 5 []  - Dermatologic / Skin Assessment (not related to wound area) 0 ASSESSMENTS - Focused Assessment []  - Circumferential Edema Measurements - multi extremities 0 []  - Nutritional Assessment / Counseling / Intervention 0 []  - Lower Extremity Assessment (monofilament, tuning fork, pulses) 0 []  - Peripheral Arterial Disease Assessment (using hand held doppler) 0 ASSESSMENTS - Ostomy and/or Continence Assessment and Care []  - Incontinence Assessment and Management 0 []  - Ostomy Care Assessment and Management (repouching, etc.) 0 PROCESS - Coordination of Care []  - Simple Patient / Family Education for ongoing care 0 X - Complex (extensive) Patient / Family Education for ongoing care 1 20 []  - Staff obtains Chiropractor, Records, Test Results / Process Orders 0 []  - Staff telephones HHA, Nursing Homes / Clarify orders / etc 0 Ficek, Azul J. ([id number]) []  - Routine Transfer to another Facility (non-emergent condition) 0 []  - Routine Hospital Admission (non-emergent condition) 0 []  - New Admissions / Manufacturing engineer / Ordering NPWT, Apligraf, etc. 0 []  - Emergency Hospital Admission (emergent condition) 0 X - Simple Discharge Coordination 1 10 []  - Complex (extensive) Discharge Coordination 0 PROCESS - Special Needs []  - Pediatric / Minor Patient Management 0 []  - Isolation Patient Management 0 []  - Hearing / Language / Visual special needs 0 []  - Assessment of Community assistance (transportation, D/C planning, etc.) 0 []  - Additional assistance / Altered mentation 0 []  - Support Surface(s) Assessment (bed, cushion,  seat, etc.) 0 INTERVENTIONS - Wound Cleansing / Measurement []  - Simple Wound Cleansing - one wound 0 X - Complex Wound Cleansing - multiple wounds  4 5 X - Wound Imaging (photographs - any number of wounds) 1 5 []  - Wound Tracing (instead of photographs) 0 []  - Simple Wound Measurement - one wound 0 X - Complex Wound Measurement - multiple wounds 4 5 INTERVENTIONS - Wound Dressings []  - Small Wound Dressing one or multiple wounds 0 []  - Medium Wound Dressing one or multiple wounds 0 X - Large Wound Dressing one or multiple wounds 2 20 []  - Application of Medications - topical 0 []  - Application of Medications - injection 0 Krakowski, Verner J. ([id number]) INTERVENTIONS - Miscellaneous []  - External ear exam 0 []  - Specimen Collection (cultures, biopsies, blood, body fluids, etc.) 0 []  - Specimen(s) / Culture(s) sent or taken to Lab for analysis 0 []  - Patient Transfer (multiple staff / Nurse, adult / Similar devices) 0 []  - Simple Staple / Suture removal (25 or less) 0 []  - Complex Staple / Suture removal (26 or more) 0 []  - Hypo / Hyperglycemic Management (close monitor of Blood Glucose) 0 []  - Ankle / Brachial Index (ABI) - do not check if billed separately 0 X - Vital Signs 1 5 Has the patient been seen at the hospital within the last three years: Yes Total Score: 145 Level Of Care: New/Established - Level 4 Electronic Signature(s) Signed: 05/26/2017 9:16:41 PM By: Elliot Gurney, BSN, RN, CWS, Kim RN, BSN Entered By: Elliot Gurney, BSN, RN, CWS, Kim on 05/26/2017 16:00:23 [name] ([id number]) -------------------------------------------------------------------------------- Encounter Discharge Information Details [name] Date of Service: 05/26/2017 3:30 PM Medical Record Patient Account Number: [id number] Number: Treating RN: Huel Coventry 08-25-1931 (83 y.o. Other Clinician: Date of Birth/Sex: Female) Treating ROBSON, MICHAEL Primary Care Shakiyah Cirilo: Guy Begin Keishawn Darsey/Extender: G Referring Emerita Berkemeier: [name] in Treatment: 15 Encounter Discharge Information Items Discharge Pain Level: 0 Discharge Condition: Stable Ambulatory Status: Wheelchair Discharge Destination: Home Transportation: Private Auto Accompanied By: son Schedule Follow-up Appointment: Yes Medication Reconciliation completed and provided to Patient/Care Yes Val Schiavo: Provided on Clinical Summary of Care: 05/26/2017 Form Type Recipient Paper Patient CF Electronic Signature(s) Signed: 05/26/2017 9:16:41 PM By: Elliot Gurney, BSN, RN, CWS, Kim RN, BSN Previous Signature: 05/26/2017 3:59:35 PM Version By: Gwenlyn Perking Entered By: Elliot Gurney BSN, RN, CWS, Kim on 05/26/2017 16:01:39 Carrick, [name] ([id number]) -------------------------------------------------------------------------------- Lower Extremity Assessment Details Patient Name: TALIAH, PORCHE. Date of Service: 05/26/2017 3:30 PM Medical Record Patient Account Number: [id number] Number: Treating RN: Huel Coventry 1931/07/03 (83 y.o. Other Clinician: Date of Birth/Sex: Female) Treating ROBSON, MICHAEL Primary Care Harutyun Monteverde: Guy Begin Shelonda Saxe/Extender: G Referring Danell Vazquez: [name] in Treatment: 15 Vascular Assessment Pulses: Dorsalis Pedis Palpable: [Left:Yes] [Right:Yes] Posterior Tibial Extremity colors, hair growth, and conditions: Extremity Color: [Left:Hyperpigmented] [Right:Hyperpigmented] Hair Growth on Extremity: [Left:No] [Right:No] Temperature of Extremity: [Left:Warm] [Right:Warm] Capillary Refill: [Left:> 3 seconds] [Right:> 3 seconds] Dependent Rubor: [Left:No] [Right:No] Blanched when Elevated: [Left:No] Lipodermatosclerosis: [Left:No] [Right:No] Toe Nail Assessment Left: Right: Thick: No No Discolored: Yes Yes Deformed: Yes Yes Improper Length and Hygiene: No No Electronic Signature(s) Signed: 05/26/2017 9:16:41 PM By: Elliot Gurney, BSN, RN, CWS, Kim RN,  BSN Entered By: Elliot Gurney, BSN, RN, CWS, Kim on 05/26/2017 15:16:03 Hammock, [name] ([id number]) -------------------------------------------------------------------------------- Multi Wound Chart Details [name] Date of Service: 05/26/2017 3:30 PM Medical Record Patient Account Number: [id number] Number: Treating RN: Huel Coventry 03-24-1931 (83 y.o. Other Clinician: Date of Birth/Sex: Female) Treating ROBSON, MICHAEL Primary Care Midge Momon: Guy Begin Amira Podolak/Extender: G Referring Sahira Cataldi: Guy Begin Weeks in Treatment: 15  Vital Signs Height(in): 66 Pulse(bpm): 70 Weight(lbs): 159 Blood Pressure 134/61 (mmHg): Body Mass Index(BMI): 26 Temperature(F): 98.5 Respiratory Rate 16 (breaths/min): Photos: Wound Location: Right Malleolus - Lateral Left Lower Leg - Medial Right Lower Leg - Medial Wounding Event: Gradually Appeared Gradually Appeared Gradually Appeared Primary Etiology: Venous Leg Ulcer Venous Leg Ulcer Venous Leg Ulcer Comorbid History: Cataracts, Arrhythmia, Cataracts, Arrhythmia, Cataracts, Arrhythmia, Hypertension, Hypertension, Hypertension, Osteoarthritis Osteoarthritis Osteoarthritis Date Acquired: 01/19/2017 01/19/2017 02/16/2017 Weeks of Treatment: 15 15 14  Wound Status: Open Open Open Measurements L x W x D 2.5x2.8x0.1 2x1.8x0.1 1x0.8x0.2 (cm) Area (cm) : 5.498 2.827 0.628 Volume (cm) : 0.55 0.283 0.126 % Reduction in Area: -32.80% -619.30% -300.00% % Reduction in Volume: -32.90% -625.60% -687.50% Classification: Full Thickness Without Full Thickness Without Full Thickness Without Exposed Support Exposed Support Exposed Support Structures Structures Structures Exudate Amount: Large Large Large Exudate Type: Serous Serous Serosanguineous Exudate Color: amber amber red, brown Wound Margin: Distinct, outline attached Distinct, outline attached Distinct, outline attached Dente, Zanyia J. ([id number]) Granulation Amount:  Medium (34-66%) Medium (34-66%) Medium (34-66%) Granulation Quality: Red Red Red Necrotic Amount: Medium (34-66%) Small (1-33%) Medium (34-66%) Exposed Structures: Fat Layer (Subcutaneous Fat Layer (Subcutaneous Fat Layer (Subcutaneous Tissue) Exposed: Yes Tissue) Exposed: Yes Tissue) Exposed: Yes Fascia: No Tendon: No Muscle: No Joint: No Bone: No Epithelialization: None None None Periwound Skin Texture: Scarring: Yes Scarring: Yes Scarring: Yes Periwound Skin Maceration: Yes Maceration: Yes Maceration: Yes Moisture: Periwound Skin Color: No Abnormalities Noted No Abnormalities Noted No Abnormalities Noted Temperature: No Abnormality No Abnormality No Abnormality Tenderness on Yes Yes Yes Palpation: Wound Preparation: Ulcer Cleansing: Ulcer Cleansing: Ulcer Cleansing: Rinsed/Irrigated with Rinsed/Irrigated with Rinsed/Irrigated with Saline, Other: soap and Saline, Other: soap and Saline, Other: soap and water water water Topical Anesthetic Topical Anesthetic Topical Anesthetic Applied: Other: lidocaine Applied: Other: lidocaine Applied: Other: lidocaine 4% 4% 4% Wound Number: 5 6 N/A Photos: N/A Wound Location: Left, Anterior Lower Leg Left Lower Leg - Lateral N/A Wounding Event: Gradually Appeared Bump N/A Primary Etiology: Venous Leg Ulcer Abscess N/A Comorbid History: N/A Cataracts, Arrhythmia, N/A Hypertension, Osteoarthritis Date Acquired: 05/12/2017 05/26/2017 N/A Weeks of Treatment: 2 0 N/A Wound Status: Healed - Epithelialized Open N/A Measurements L x W x D 0x0x0 0.1x0.2x1 N/A (cm) Area (cm) : 0 0.016 N/A Volume (cm) : 0 0.016 N/A % Reduction in Area: 100.00% 0.00% N/A % Reduction in Volume: 100.00% 0.00% N/A Classification: Partial Thickness N/A Mathieson, Aleida J. ([id number]) Full Thickness Without Exposed Support Structures Exudate Amount: N/A Large N/A Exudate Type: N/A Purulent N/A Exudate Color: N/A yellow, brown, green N/A Wound Margin: N/A  Indistinct, nonvisible N/A Granulation Amount: N/A N/A N/A Granulation Quality: N/A N/A N/A Necrotic Amount: N/A N/A N/A Exposed Structures: N/A N/A N/A Epithelialization: N/A Large (67-100%) N/A Periwound Skin Texture: No Abnormalities Noted Induration: Yes N/A Periwound Skin No Abnormalities Noted No Abnormalities Noted N/A Moisture: Periwound Skin Color: No Abnormalities Noted No Abnormalities Noted N/A Temperature: N/A N/A N/A Tenderness on No No N/A Palpation: Wound Preparation: N/A Ulcer Cleansing: Other: N/A soap and water Topical Anesthetic Applied: Other: lidocaine 4% Treatment Notes Wound #2 (Right, Lateral Malleolus) 1. Cleansed with: Cleanse wound with antibacterial soap and water 2. Anesthetic Topical Lidocaine 4% cream to wound bed prior to debridement 3. Peri-wound Care: Barrier cream 4. Dressing Applied: Aquacel Ag 5. Secondary Dressing Applied ABD Pad Foam Notes Kerix and Coban to secure Wound #3 (Left, Medial Lower Leg) 1. Cleansed with: Cleanse wound with antibacterial soap and water  2. Anesthetic Topical Lidocaine 4% cream to wound bed prior to debridement Blakeley, Klarisa J. ([id number]) 3. Peri-wound Care: Barrier cream 4. Dressing Applied: Aquacel Ag 5. Secondary Dressing Applied ABD Pad Foam Notes Kerix and Coban to secure Wound #4 (Right, Medial Lower Leg) 1. Cleansed with: Cleanse wound with antibacterial soap and water 2. Anesthetic Topical Lidocaine 4% cream to wound bed prior to debridement 3. Peri-wound Care: Barrier cream 4. Dressing Applied: Aquacel Ag 5. Secondary Dressing Applied ABD Pad Foam Notes Kerix and Coban to secure Wound #6 (Left, Lateral Lower Leg) 1. Cleansed with: Cleanse wound with antibacterial soap and water 2. Anesthetic Topical Lidocaine 4% cream to wound bed prior to debridement 3. Peri-wound Care: Barrier cream 4. Dressing Applied: Aquacel Ag 5. Secondary Dressing Applied ABD  Pad Foam Notes Kerix and Coban to secure Electronic Signature(s) Signed: 05/26/2017 5:26:18 PM By: Baltazar Najjar MD Entered By: Baltazar Najjar on 05/26/2017 16:56:36 Bakula, SENAYA DICENSO ([id number]) MEREDITH, KILBRIDE ([id number]) -------------------------------------------------------------------------------- Multi-Disciplinary Care Plan Details Patient Name: SABREENA, VOGAN. Date of Service: 05/26/2017 3:30 PM Medical Record Patient Account Number: [id number] Number: Treating RN: Huel Coventry March 02, 1931 (83 y.o. Other Clinician: Date of Birth/Sex: Female) Treating ROBSON, MICHAEL Primary Care Devann Cribb: Guy Begin Cyree Chuong/Extender: G Referring Alyssah Algeo: [name] in Treatment: 15 Active Inactive ` Abuse / Safety / Falls / Self Care Management Nursing Diagnoses: Potential for falls Goals: Patient will remain injury free Date Initiated: 02/09/2017 Target Resolution Date: 04/17/2017 Goal Status: Active Interventions: Assess fall risk on admission and as needed Assess self care needs on admission and as needed Notes: ` Nutrition Nursing Diagnoses: Imbalanced nutrition Goals: Patient/caregiver agrees to and verbalizes understanding of need to use nutritional supplements and/or vitamins as prescribed Date Initiated: 02/09/2017 Target Resolution Date: 04/17/2017 Goal Status: Active Interventions: Assess patient nutrition upon admission and as needed per policy Notes: ` Orientation to the Wound Care Program VERANIA, SALBERG ([id number]) Nursing Diagnoses: Knowledge deficit related to the wound healing center program Goals: Patient/caregiver will verbalize understanding of the Wound Healing Center Program Date Initiated: 02/09/2017 Target Resolution Date: 02/20/2017 Goal Status: Active Interventions: Provide education on orientation to the wound center Notes: ` Pain, Acute or Chronic Nursing Diagnoses: Pain, acute or chronic: actual or  potential Potential alteration in comfort, pain Goals: Patient/caregiver will verbalize adequate pain control between visits Date Initiated: 02/09/2017 Target Resolution Date: 04/17/2017 Goal Status: Active Interventions: Assess comfort goal upon admission Complete pain assessment as per visit requirements Notes: ` Wound/Skin Impairment Nursing Diagnoses: Impaired tissue integrity Knowledge deficit related to smoking impact on wound healing Knowledge deficit related to ulceration/compromised skin integrity Goals: Ulcer/skin breakdown will have a volume reduction of 80% by week 12 Date Initiated: 02/09/2017 Target Resolution Date: 04/10/2017 Goal Status: Active Interventions: Assess patient/caregiver ability to perform ulcer/skin care regimen upon admission and as needed CEILIDH, TORREGROSSA ([id number]) Assess ulceration(s) every visit Notes: Electronic Signature(s) Signed: 05/26/2017 9:16:41 PM By: Elliot Gurney, BSN, RN, CWS, Kim RN, BSN Entered By: Elliot Gurney, BSN, RN, CWS, Kim on 05/26/2017 15:16:10 Neyhart, [name] ([id number]) -------------------------------------------------------------------------------- Pain Assessment Details [name] Date of Service: 05/26/2017 3:30 PM Medical Record Patient Account Number: [id number] Number: Treating RN: Huel Coventry 02-21-1931 (83 y.o. Other Clinician: Date of Birth/Sex: Female) Treating ROBSON, MICHAEL Primary Care Maressa Apollo: Guy Begin Banks Chaikin/Extender: G Referring Christiane Sistare: [name] in Treatment: 15 Active Problems Location of Pain Severity and Description of Pain Patient Has Paino No Site Locations With Dressing Change: No Pain Management and  Medication Current Pain Management: Electronic Signature(s) Signed: 05/26/2017 9:16:41 PM By: Elliot Gurney, BSN, RN, CWS, Kim RN, BSN Entered By: Elliot Gurney, BSN, RN, CWS, Kim on 05/26/2017 14:56:32 Dicola, [name]  ([id number]) -------------------------------------------------------------------------------- Patient/Caregiver Education Details Patient Name: NITYA, CAUTHON Date of Service: 05/26/2017 3:30 PM Medical Record Patient Account Number: [id number] Number: Treating RN: Huel Coventry 02-11-1931 (83 y.o. Other Clinician: Date of Birth/Gender: Female) Treating ROBSON, MICHAEL Primary Care Physician: Guy Begin Physician/Extender: G Referring Physician: [name] in Treatment: 15 Education Assessment Education Provided To: Patient Education Topics Provided Wound/Skin Impairment: Handouts: Caring for Your Ulcer, Other: wound care as prescribed Methods: Demonstration, Explain/Verbal Responses: State content correctly Electronic Signature(s) Signed: 05/26/2017 9:16:41 PM By: Elliot Gurney, BSN, RN, CWS, Kim RN, BSN Entered By: Elliot Gurney, BSN, RN, CWS, Kim on 05/26/2017 16:01:54 SOBIA, KARGER ([id number]) -------------------------------------------------------------------------------- Wound Assessment Details Patient Name: YITTA, GONGAWARE. Date of Service: 05/26/2017 3:30 PM Medical Record Patient Account Number: [id number] Number: Treating RN: Huel Coventry 02/13/1931 (83 y.o. Other Clinician: Date of Birth/Sex: Female) Treating ROBSON, MICHAEL Primary Care Pheng Prokop: Guy Begin Jaylean Buenaventura/Extender: G Referring Mry Lamia: Guy Begin Weeks in Treatment: 15 Wound Status Wound Number: 2 Primary Venous Leg Ulcer Etiology: Wound Location: Right Malleolus - Lateral Wound Status: Open Wounding Event: Gradually Appeared Comorbid Cataracts, Arrhythmia, Hypertension, Date Acquired: 01/19/2017 History: Osteoarthritis Weeks Of Treatment: 15 Clustered Wound: No Photos Photo Uploaded By: Elliot Gurney, BSN, RN, CWS, Kim on 05/26/2017 15:19:07 Wound Measurements Length: (cm) 2.5 Width: (cm) 2.8 Depth: (cm) 0.1 Area: (cm) 5.498 Volume: (cm) 0.55 % Reduction in Area:  -32.8% % Reduction in Volume: -32.9% Epithelialization: None Tunneling: No Undermining: No Wound Description Full Thickness Without Exposed Foul Odor After C Classification: Support Structures Slough/Fibrino Wound Margin: Distinct, outline attached Exudate Large Amount: Exudate Type: Serous Exudate Color: amber leansing: No Yes Wound Bed Granulation Amount: Medium (34-66%) Exposed Structure Granulation Quality: Red Fat Layer (Subcutaneous Tissue) Exposed: Yes Necrotic Amount: Medium (34-66%) Santino, Ruvi J. ([id number]) Necrotic Quality: Adherent Slough Periwound Skin Texture Texture Color No Abnormalities Noted: No No Abnormalities Noted: No Scarring: Yes Temperature / Pain Moisture Temperature: No Abnormality No Abnormalities Noted: No Tenderness on Palpation: Yes Maceration: Yes Wound Preparation Ulcer Cleansing: Rinsed/Irrigated with Saline, Other: soap and water, Topical Anesthetic Applied: Other: lidocaine 4%, Treatment Notes Wound #2 (Right, Lateral Malleolus) 1. Cleansed with: Cleanse wound with antibacterial soap and water 2. Anesthetic Topical Lidocaine 4% cream to wound bed prior to debridement 3. Peri-wound Care: Barrier cream 4. Dressing Applied: Aquacel Ag 5. Secondary Dressing Applied ABD Pad Foam Notes Kerix and Coban to Pharmacist, community) Signed: 05/26/2017 9:16:41 PM By: Elliot Gurney, BSN, RN, CWS, Kim RN, BSN Entered By: Elliot Gurney, BSN, RN, CWS, Kim on 05/26/2017 15:12:29 TVISHA, SCHWOERER ([id number]) -------------------------------------------------------------------------------- Wound Assessment Details Patient Name: LYSETTE, LINDENBAUM. Date of Service: 05/26/2017 3:30 PM Medical Record Patient Account Number: [id number] Number: Treating RN: Huel Coventry [birth year] (83 y.o. Other Clinician: Date of Birth/Sex: Female) Treating ROBSON, MICHAEL Primary Care Keandrea Tapley: Guy Begin Marlane Hirschmann/Extender: G Referring Kelbi Renstrom:  Guy Begin Weeks in Treatment: 15 Wound Status Wound Number: 3 Primary Venous Leg Ulcer Etiology: Wound Location: Left Lower Leg - Medial Wound Status: Open Wounding Event: Gradually Appeared Comorbid Cataracts, Arrhythmia, Hypertension, Date Acquired: 01/19/2017 History: Osteoarthritis Weeks Of Treatment: 15 Clustered Wound: No Photos Photo Uploaded By: Elliot Gurney, BSN, RN, CWS, Kim on 05/26/2017 15:19:53 Wound Measurements Length: (cm) 2 Width: (cm) 1.8 Depth: (cm) 0.1 Area: (cm) 2.827 Volume: (cm) 0.283 % Reduction in Area: -619.3% %  Reduction in Volume: -625.6% Epithelialization: None Tunneling: No Undermining: No Wound Description Full Thickness Without Exposed Foul Odor After C Classification: Support Structures Slough/Fibrino Wound Margin: Distinct, outline attached Exudate Large Amount: Exudate Type: Serous Exudate Color: amber leansing: No Yes Wound Bed Granulation Amount: Medium (34-66%) Exposed Structure Granulation Quality: Red Fat Layer (Subcutaneous Tissue) Exposed: Yes Necrotic Amount: Small (1-33%) Millward, Kela J. ([id number]) Necrotic Quality: Adherent Slough Periwound Skin Texture Texture Color No Abnormalities Noted: No No Abnormalities Noted: No Scarring: Yes Temperature / Pain Moisture Temperature: No Abnormality No Abnormalities Noted: No Tenderness on Palpation: Yes Maceration: Yes Wound Preparation Ulcer Cleansing: Rinsed/Irrigated with Saline, Other: soap and water, Topical Anesthetic Applied: Other: lidocaine 4%, Treatment Notes Wound #3 (Left, Medial Lower Leg) 1. Cleansed with: Cleanse wound with antibacterial soap and water 2. Anesthetic Topical Lidocaine 4% cream to wound bed prior to debridement 3. Peri-wound Care: Barrier cream 4. Dressing Applied: Aquacel Ag 5. Secondary Dressing Applied ABD Pad Foam Notes Kerix and Coban to Pharmacist, community) Signed: 05/26/2017 9:16:41 PM By: Elliot Gurney, BSN, RN, CWS,  Kim RN, BSN Entered By: Elliot Gurney, BSN, RN, CWS, Kim on 05/26/2017 15:13:04 ICEL, CASTLES ([id number]) -------------------------------------------------------------------------------- Wound Assessment Details Patient Name: LEEYAH, HEATHER. Date of Service: 05/26/2017 3:30 PM Medical Record Patient Account Number: [id number] Number: Treating RN: Huel Coventry 1931/11/01 (83 y.o. Other Clinician: Date of Birth/Sex: Female) Treating ROBSON, MICHAEL Primary Care Marco Adelson: Guy Begin Mckinzee Spirito/Extender: G Referring Vernell Townley: Guy Begin Weeks in Treatment: 15 Wound Status Wound Number: 4 Primary Venous Leg Ulcer Etiology: Wound Location: Right Lower Leg - Medial Wound Status: Open Wounding Event: Gradually Appeared Comorbid Cataracts, Arrhythmia, Hypertension, Date Acquired: 02/16/2017 History: Osteoarthritis Weeks Of Treatment: 14 Clustered Wound: No Photos Photo Uploaded By: Elliot Gurney, BSN, RN, CWS, Kim on 05/26/2017 15:19:07 Wound Measurements Length: (cm) 1 Width: (cm) 0.8 Depth: (cm) 0.2 Area: (cm) 0.628 Volume: (cm) 0.126 % Reduction in Area: -300% % Reduction in Volume: -687.5% Epithelialization: None Tunneling: No Undermining: No Wound Description Full Thickness Without Exposed Foul Odor After C Classification: Support Structures Slough/Fibrino Wound Margin: Distinct, outline attached Exudate Large Amount: Exudate Type: Serosanguineous Exudate Color: red, brown leansing: No Yes Wound Bed Granulation Amount: Medium (34-66%) Exposed Structure Granulation Quality: Red Fascia Exposed: No Necrotic Amount: Medium (34-66%) Fat Layer (Subcutaneous Tissue) Exposed: Yes Gebert, Genavieve J. ([id number]) Necrotic Quality: Adherent Slough Tendon Exposed: No Muscle Exposed: No Joint Exposed: No Bone Exposed: No Periwound Skin Texture Texture Color No Abnormalities Noted: No No Abnormalities Noted: No Scarring: Yes Temperature / Pain Moisture Temperature:  No Abnormality No Abnormalities Noted: No Tenderness on Palpation: Yes Maceration: Yes Wound Preparation Ulcer Cleansing: Rinsed/Irrigated with Saline, Other: soap and water, Topical Anesthetic Applied: Other: lidocaine 4%, Treatment Notes Wound #4 (Right, Medial Lower Leg) 1. Cleansed with: Cleanse wound with antibacterial soap and water 2. Anesthetic Topical Lidocaine 4% cream to wound bed prior to debridement 3. Peri-wound Care: Barrier cream 4. Dressing Applied: Aquacel Ag 5. Secondary Dressing Applied ABD Pad Foam Notes Kerix and Coban to Pharmacist, community) Signed: 05/26/2017 9:16:41 PM By: Elliot Gurney, BSN, RN, CWS, Kim RN, BSN Entered By: Elliot Gurney, BSN, RN, CWS, Kim on 05/26/2017 15:13:32 Domitila, Stetler [name] ([id number]) -------------------------------------------------------------------------------- Wound Assessment Details Patient Name: NEIRA, BENTSEN. Date of Service: 05/26/2017 3:30 PM Medical Record Patient Account Number: [id number] Number: Treating RN: Huel Coventry June 22, 1931 (83 y.o. Other Clinician: Date of Birth/Sex: Female) Treating ROBSON, MICHAEL Primary Care Laquinn Shippy: Guy Begin Beautiful Pensyl/Extender: G Referring Erilyn Pearman: Guy Begin Weeks in Treatment:  15 Wound Status Wound Number: 5 Primary Etiology: Venous Leg Ulcer Wound Location: Left, Anterior Lower Leg Wound Status: Healed - Epithelialized Wounding Event: Gradually Appeared Date Acquired: 05/12/2017 Weeks Of Treatment: 2 Clustered Wound: No Photos Photo Uploaded By: Elliot Gurney, BSN, RN, CWS, Kim on 05/26/2017 15:19:53 Wound Measurements Length: (cm) 0 % Reduction i Width: (cm) 0 % Reduction i Depth: (cm) 0 Area: (cm) 0 Volume: (cm) 0 n Area: 100% n Volume: 100% Wound Description Classification: Partial Thickness Periwound Skin Texture Texture Color No Abnormalities Noted: No No Abnormalities Noted: No Moisture No Abnormalities Noted: No Electronic  Signature(s) Signed: 05/26/2017 9:16:41 PM By: Elliot Gurney, BSN, RN, CWS, Kim RN, BSN Entered By: Elliot Gurney, BSN, RN, CWS, Kim on 05/26/2017 15:11:55 Sharps, [name] ([id number]) Sanderlin, Azucena Shela Commons ([id number]) -------------------------------------------------------------------------------- Wound Assessment Details [name] Date of Service: 05/26/2017 3:30 PM Medical Record Patient Account Number: [id number] Number: Treating RN: Huel Coventry 1931-09-30 (83 y.o. Other Clinician: Date of Birth/Sex: Female) Treating ROBSON, MICHAEL Primary Care Deonte Otting: Guy Begin Genavie Boettger/Extender: G Referring Khayla Koppenhaver: Guy Begin Weeks in Treatment: 15 Wound Status Wound Number: 6 Primary Abscess Etiology: Wound Location: Left Lower Leg - Lateral Wound Status: Open Wounding Event: Bump Comorbid Cataracts, Arrhythmia, Hypertension, Date Acquired: 05/26/2017 History: Osteoarthritis Weeks Of Treatment: 0 Clustered Wound: No Photos Photo Uploaded By: Elliot Gurney, BSN, RN, CWS, Kim on 05/26/2017 15:20:07 Wound Measurements Length: (cm) 0.1 Width: (cm) 0.2 Depth: (cm) 1 Area: (cm) 0.016 Volume: (cm) 0.016 % Reduction in Area: 0% % Reduction in Volume: 0% Epithelialization: Large (67-100%) Tunneling: No Undermining: No Wound Description Full Thickness Without Exposed Classification: Support Structures Wound Margin: Indistinct, nonvisible Exudate Large Amount: Exudate Type: Purulent Exudate Color: yellow, brown, green Foul Odor After Cleansing: No Slough/Fibrino No Periwound Skin Texture Texture Color No Abnormalities Noted: No No Abnormalities Noted: No Induration: Yes Delao, Bree J. ([id number]) Moisture No Abnormalities Noted: No Wound Preparation Ulcer Cleansing: Other: soap and water, Topical Anesthetic Applied: Other: lidocaine 4%, Treatment Notes Wound #6 (Left, Lateral Lower Leg) 1. Cleansed with: Cleanse wound with antibacterial soap and  water 2. Anesthetic Topical Lidocaine 4% cream to wound bed prior to debridement 3. Peri-wound Care: Barrier cream 4. Dressing Applied: Aquacel Ag 5. Secondary Dressing Applied ABD Pad Foam Notes Kerix and Coban to Pharmacist, community) Signed: 05/26/2017 9:16:41 PM By: Elliot Gurney, BSN, RN, CWS, Kim RN, BSN Entered By: Elliot Gurney, BSN, RN, CWS, Kim on 05/26/2017 15:14:31 KWASNY, [name] ([id number]) -------------------------------------------------------------------------------- Vitals Details [name] Date of Service: 05/26/2017 3:30 PM Medical Record Patient Account Number: [id number] Number: Treating RN: Huel Coventry 1931/09/30 (83 y.o. Other Clinician: Date of Birth/Sex: Female) Treating ROBSON, MICHAEL Primary Care Jadaya [place]: Guy Begin Dacota Ruben/Extender: G Referring Lillyn Wieczorek: Guy Begin Weeks in Treatment: 15 Vital Signs Time Taken: 14:55 Temperature (F): 98.5 Height (in): 66 Pulse (bpm): 70 Weight (lbs): 159 Respiratory Rate (breaths/min): 16 Body Mass Index (BMI): 25.7 Blood Pressure (mmHg): 134/61 Reference Range: 80 - 120 mg / dl Electronic Signature(s) Signed: 05/26/2017 9:16:41 PM By: Elliot Gurney, BSN, RN, CWS, Kim RN, BSN Entered By: Elliot Gurney, BSN, RN, CWS, Kim on 05/26/2017 14:56:52

## 2017-05-28 NOTE — Progress Notes (Signed)
Hopkins, Jordan ([id number]) Visit Report for 05/26/2017 Chief Complaint Document Details [name], [name]. Date of Service: 05/26/2017 3:30 PM Medical Record Patient Account Number: [id number] Number: Treating RN: Huel Coventry 1931/05/06 (83 y.o. Other Clinician: Date of Birth/Sex: Female) Treating Kmari Halter Primary Care Provider: Guy Begin Provider/Extender: G Referring Provider: [name] in Treatment: 15 Information Obtained from: Patient Chief Complaint the patient is here for follow-up evaluation of her bilateral lower extremity ulcers Electronic Signature(s) Signed: 05/26/2017 5:26:18 PM By: Baltazar Najjar MD Entered By: Baltazar Najjar on 05/26/2017 16:56:42 [name], [name] ([id number]) -------------------------------------------------------------------------------- HPI Details [name] Date of Service: 05/26/2017 3:30 PM Medical Record Patient Account Number: [id number] Number: Treating RN: Huel Coventry 1931/04/23 (83 y.o. Other Clinician: Date of Birth/Sex: Female) Treating Aracelli Woloszyn Primary Care Provider: Guy Begin Provider/Extender: G Referring Provider: Guy Begin Weeks in Treatment: 15 History of Present Illness HPI Description: 06/10/16; this is an 83 year old woman who lives near [place] with family members. She saw her primary doctor roughly over a week ago and was referred to the ER for a cellulitis in the right medial leg. She was given a round of IV antibiotics in the emergency room and discharged on oral Keflex which she is taking. She is been left with a uncomfortable leg area on the right medial lower leg. Her ABIs in this clinic were 0.8 on the right 0.6 on the left. She does not describe claudication. She is not a diabetic. Lab work in the ER showed a normal BUN and creatinine on 6/20 at 18 and 0.92 respectively white count was 10.1 hemoglobin at 13.8. She did not have  any imaging studies. I can see no vascular evaluations in Epic. She does not have a prior wound history 06/17/16; the area affected on her right medial ankle is an area I thought was due to tissue damage from cellulitis last week. She is going for venous reflux studies this afternoon, these were not ordered in this clinic at least not by me. The patient does not give a prior history of damage to the skin in this area although I wonder if she actually might not of noticed it. We have been using's Aquacel Ag under a wrap although we will not be able to wrap her today 06/24/16: pt reports today she hasn't heard from the vascular clinic regarding proceeding with arterial studies. our office will assist with this today. she denies s/s of infection. denies problems with her wraps. 07/07/16; since I last saw this woman she was admitted to hospital from 7/16 through 7/18 with sepsis syndrome felt to be secondary to her ulcer. She was treated with Vanco and Zosyn the hospital discharged on Septra. Blood cultures were negative. Urine culture showed multiple species she is now feeling well using Santyl with border foam to her wound. She has [facility] 07/14/16; no major improvement here. She has a quarter-sized wound covered with thick adherent slough on the lateral aspect of her right leg with several surrounding satellite lesions in a similar state. She has had 2 recent bouts of cellulitis, one before she came to this clinic and one required a recent hospitalization. I have asked for arterial studies I don't believe these have ever been done. My notes suggest from early in July that she went for reflux studies although I don't know that I have ever seen these either, she did have a DVT rule out but I did not order this, this  did not show a DVT but did show a Baker's cyst 07/21/2016 -- the patient had an arterial duplex study done with waveform suggestive of right femoropopliteal disease. There is also  small vessel disease bilaterally. Right ABI was in the normal range of 1.0 and a TBI was 0.74. Left ABI is abnormal at 0.69 with a TBI of 0.40. Three-vessel runoff on the right and a two-vessel runoff on the left. She has an appointment to see Dr. Kirke Corin later today. 07/28/2016 -- was seen by Dr. Kirke Corin who reviewed her arterial studies with an ABI of 0.94 on the right and 0.69 on the left with normal right toe brachial index. Duplex showed diffuse atherosclerosis without discrete Key, Marshayla J. ([id number]) stenosis and there was a three-vessel runoff on the right below the knee. Based on these studies and the location of the ulcer he thought it was venous commended she continue with wound care consults. 08/04/16; I note the patient's reasonably functional arterial supply which is fortunate. The wound bed appears to be smaller. Debrided of surface slough. She is been using Iodoflex 08/11/16: Only a very small open area remains. However it has some depth 08/18/16; small open area is smaller. Still open however. 08/25/16; the area has totally closed down and epithelialized. READMISSION 02/09/17; this is an 83 year old woman who is not a diabetic or smoker. She was in our clinic in the summer into mid-September with a wound on her right medial calf that. This eventually closed down. I thought this was mostly venous. She did have evidence of PAD and she was seen by Dr. Kirke Corin. At that point she had an ABI of 0.94 on the right 0.69 on the left with a normal right toe brachial index. It was felt that she had enough blood flow to close her wound and that happened. She was discharged with stockings although I don't get the sense that she really use them she tells me that roughly a week or 2 she developed swelling in her bilateral lower legs and then noted wounds on her right lateral malleolus and left medial calf. The on this she is not really sure how this happened. She is not having a lot of pain although  she is not ambulatory that much only in her home. Other than weight loss she has not noticed any other issues no specific claudication no chest pain. 02/16/17; the patient continues with 2 wounds on the right lateral malleolus and left medial calf. Both of these are painful. She has chronic venous insufficiency but also known PAD and is previously seen Dr. Kirke Corin. We have arranged vascular follow-up with Dr. Kirke Corin to look at the arterial situation 02/25/2017 -- patient was brought in today because she missed her appointment last week with Dr. Leanord Hawking and is having a lot of pain and drainage from her wounds. The interim she was seen by Dr. Bascom Levels who did a duplex of the right lower extremity which shows a right ABI of 0.95 and left ABI 0.74. There was also a abdominal aortogram with a bilateral extremity angiography done for significant peripheral vascular disease. The right lower extremity showed diffuse SFA and popliteal disease with significant stenosis affecting the TP trunk just before the origin of the peritoneal artery. The left lower extremity showed moderate left SFA stenosis with occluded popliteal artery and tibioperoneal vessels with only visible collateral. The opinion was the patient had no endovascular surgical revascularization options of the left lower extremity and Dr. Bascom Levels would discuss it with Dr.  Randie Heinz for a second opinion. Endovascular intervention on the right TP trunk could be considered. 03/03/17; the patient arrives back today having last seen Dr. Meyer Russel. As noted she has severe PAD which is not amenable to either bypass surgery or endovascular interventions on the left. Endovascular interventions in the right TP trunk could be considered. She has large wounds on the right medial leg and a sizable wound on the right lateral leg. Small wound on the left medial leg. She is really in a lot of discomfort. Using Santyl on the right and I believe Prisma on the left. 03/10/17; the  patient arrives today again with large wounds on the right lateral lower extremity, right medial lower extremity. And a small punched out wound on the left medial lower extremity. These are in the setting of severe non-revascularizable PAD. She continues to complain of discomfort although I don't think right now this is unmanageable. We have been using Santyl. We are arranging getting her home health [[facility]] 03/16/17- patient is here for follow-up evaluation of her bilateral lower extremity ulcers. She has an appointment on 4/11 for an arthrectomy per Dr. Kirke Corin at [facility]. She is continuing to [place]. Her son is doing her dressing changes 03/30/17 still using santyl. sincer her last visit she was admitted from 4/11-4/12 for arthrectomy and balloon angioplasty of the right distal popliteal artery with TP trunk and peritoneal arteries. she is on dual antiplatelet [name], [name]. ([id number]) and states her pain is somewhat better on the right 04/13/17 still using santyl to all wounds. Area on the right medial and left lateral look better. Still difficulties with the right lateral wouind. Combination of arterial insufficiency and Venous stasis No revascularization options on the left. Possible interventions on the right (see above) 04/20/17; the patient is still using Santyl to all wound areas. We've had some improvement in the right medial and left lateral. Patient has a combination of arterial and venous insufficiency. No revascularization options are available to the patient on the left however possible interventions on the right. 04/27/17; I continued using Santyl to all wound areas. We've had improvement in the right medial wound and the left lateral wound. The right lateral lower leg wound has remained static. She arrives today with a lot more edema in her bilateral legs this is pitting. She does not complain of chest pain or shortness of breath tells me she has no prior cardiac  issues. 05/12/17; I been using Santyl to all of this lady's wounds. She has an appointment with Dr. Kirke Corin next week. The other issue this week is that apparently her insurance and [facility] are no longer under contract though there was some suggestion that there is another home health company, we can't really determine who that was today. We change the primary dressing to all the wounds to [facility] 05/19/17; patient's appointment with Dr. Kirke Corin is actually next week. We have been using Hydrofera Blue to her wounds 05/26/17 as it turns out Arida's appointment was canceled by his office. It is now in early July. Patient arrives today with a large amount of green drainage coming out of the right lateral leg, she was also discovered by our intake nurse that have an abscess on the left lateral lower leg. We did not previously define a wound in this area Electronic Signature(s) Signed: 05/26/2017 5:26:18 PM By: Baltazar Najjar MD Entered By: Baltazar Najjar on 05/26/2017 16:58:10 [name], [name] ([id number]) -------------------------------------------------------------------------------- Physical Exam Details [name], [name].  Date of Service: 05/26/2017 3:30 PM Medical Record Patient Account Number: [id number] Number: Treating RN: Huel CoventryWoody, Kim [birth year] (83 y.o. Other Clinician: Date of Birth/Sex: Female) Treating Erionna Strum Primary Care Provider: [name], NARGES Provider/Extender: G Referring Provider: [name], NARGES Weeks in Treatment: 15 Constitutional Sitting or standing Blood Pressure is within target range for patient.. Pulse regular and within target range for patient.Marland Kitchen. Respirations regular, non-labored and within target range.. Temperature is normal and within the target range for the patient.Marland Kitchen. appears in no distress. Eyes Conjunctivae clear. No discharge. Respiratory Respiratory effort is easy and symmetric bilaterally. Rate is normal at rest  and on room air.. Cardiovascular Femoral arteries without bruits and pulses strong. Popliteal pulses palpable. Faint pedal pulses. Gastrointestinal (GI) Abdomen is soft and non-distended without masses or tenderness. Bowel sounds active in all quadrants.. Lymphatic None palpable in the popliteal or inguinal area. Integumentary (Hair, Skin) No rash noted no tenderness in the upper thigh. Psychiatric No evidence of depression, anxiety, or agitation. Calm, cooperative, and communicative. Appropriate interactions and affect.. Notes Wound exam; the area on her right lateral leg was draining green drainage. The wound does not look too much different and there is no surrounding cellulitis nevertheless thick green drainage concern me. oArea on the right medial leg looks stable to improved only a small open area remains oArea on the left medial leg also looks healthy oOn the left lateral leg is a raised abscess with significant surrounding cellulitis. A culture of this area was done Electronic Signature(s) Signed: 05/26/2017 5:26:18 PM By: Baltazar Najjarobson, Garey Alleva MD Entered By: Baltazar Najjarobson, Kemora Pinard on 05/26/2017 17:00:53 Clevenger, [name] ([id number]) -------------------------------------------------------------------------------- Physician Orders Details Patient Name: Jordan BandaFULLER, Jordan J. Date of Service: 05/26/2017 3:30 PM Medical Record Patient Account Number: [id number] Number: Treating RN: Huel CoventryWoody, Kim [birth year] (83 y.o. Other Clinician: Date of Birth/Sex: Female) Treating [place] Dodgen Primary Care Provider: [name], NARGES Provider/Extender: G Referring Provider: Gabriel RungFARAHI, NARGES Weeks in Treatment: 15 Verbal / Phone Orders: No Diagnosis Coding Wound Cleansing Wound #2 Right,Lateral Malleolus o Clean wound with Normal Saline. o Cleanse wound with mild soap and water Wound #3 Left,Medial Lower Leg o Clean wound with Normal Saline. o Cleanse wound with mild soap and water Wound #4  Right,Medial Lower Leg o Clean wound with Normal Saline. o Cleanse wound with mild soap and water Wound #6 Left,Lateral Lower Leg o Clean wound with Normal Saline. o Cleanse wound with mild soap and water Anesthetic Wound #2 Right,Lateral Malleolus o Topical Lidocaine 4% cream applied to wound bed prior to debridement - clinic use only Wound #3 Left,Medial Lower Leg o Topical Lidocaine 4% cream applied to wound bed prior to debridement - clinic use only Wound #4 Right,Medial Lower Leg o Topical Lidocaine 4% cream applied to wound bed prior to debridement - clinic use only Wound #6 Left,Lateral Lower Leg o Topical Lidocaine 4% cream applied to wound bed prior to debridement - clinic use only Skin Barriers/Peri-Wound Care Wound #2 Right,Lateral Malleolus o Barrier cream Wound #3 Left,Medial Lower Leg Calvey, Zaliah J. ([id number]) o Barrier cream Wound #4 Right,Medial Lower Leg o Barrier cream Wound #6 Left,Lateral Lower Leg o Barrier cream Primary Wound Dressing Wound #2 Right,Lateral Malleolus o Aquacel Ag Wound #3 Left,Medial Lower Leg o Aquacel Ag Wound #4 Right,Medial Lower Leg o Aquacel Ag Wound #6 Left,Lateral Lower Leg o Aquacel Ag Secondary Dressing Wound #2 Right,Lateral Malleolus o Foam Wound #3 Left,Medial Lower Leg o ABD pad Wound #4 Right,Medial Lower Leg o Foam Wound #6 Left,Lateral Lower  Leg o ABD pad Dressing Change Frequency Wound #2 Right,Lateral Malleolus o Change dressing every week Wound #3 Left,Medial Lower Leg o Change dressing every week Wound #4 Right,Medial Lower Leg o Change dressing every week Wound #6 Left,Lateral Lower Leg o Change dressing every week Follow-up Appointments [name], [name] ([id number]) Wound #2 Right,Lateral Malleolus o Return Appointment in 1 week. Wound #3 Left,Medial Lower Leg o Return Appointment in 1 week. Wound #4 Right,Medial Lower Leg o Return  Appointment in 1 week. Wound #6 Left,Lateral Lower Leg o Return Appointment in 1 week. Edema Control Wound #2 Right,Lateral Malleolus o Kerlix and Coban - Bilateral - wrap lightly o Elevate legs to the level of the heart and pump ankles as often as possible Wound #3 Left,Medial Lower Leg o Kerlix and Coban - Bilateral - wrap lightly o Elevate legs to the level of the heart and pump ankles as often as possible Wound #4 Right,Medial Lower Leg o Kerlix and Coban - Bilateral - wrap lightly o Elevate legs to the level of the heart and pump ankles as often as possible Wound #6 Left,Lateral Lower Leg o Kerlix and Coban - Bilateral - wrap lightly o Elevate legs to the level of the heart and pump ankles as often as possible Additional Orders / Instructions Wound #2 Right,Lateral Malleolus o Increase protein intake. Wound #3 Left,Medial Lower Leg o Increase protein intake. Wound #4 Right,Medial Lower Leg o Increase protein intake. Wound #6 Left,Lateral Lower Leg o Increase protein intake. Laboratory o Bacteria identified in Wound by Culture (MICRO) - Left Lateral lower leg oooo LOINC Code: 6462-6 oooo Convenience Name: Wound culture routine [name], [name] ([id number]) Electronic Signature(s) Signed: 05/26/2017 5:26:18 PM By: Baltazar Najjar MD Signed: 05/26/2017 9:16:41 PM By: Elliot Gurney, BSN, RN, CWS, Kim RN, BSN Entered By: Elliot Gurney, BSN, RN, CWS, Kim on 05/26/2017 15:58:27 Peta, Peachey [name] ([id number]) -------------------------------------------------------------------------------- Problem List Details [name], KOHLI. Date of Service: 05/26/2017 3:30 PM Medical Record Patient Account Number: [id number] Number: Treating RN: Huel Coventry 1931/08/26 (83 y.o. 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/09/2017 Yes muscle L97.221 Non-pressure chronic ulcer of left calf limited to 02/09/2017 Yes breakdown of skin I70.233 Atherosclerosis of native arteries of right leg with 02/25/2017 Yes ulceration of ankle I70.243 Atherosclerosis of native arteries of left leg with ulceration 02/25/2017 Yes of ankle Inactive Problems Resolved Problems Electronic Signature(s) Signed: 05/26/2017 5:26:18 PM By: Baltazar Najjar MD Entered By: Baltazar Najjar on 05/26/2017 16:56:28 Demarest, [name] ([id number]) -------------------------------------------------------------------------------- Progress Note Details [name] Date of Service: 05/26/2017 3:30 PM Medical Record Patient Account Number: [id number] Number: Treating RN: Huel Coventry 1931-09-21 (83 y.o. Other Clinician: Date of Birth/Sex: Female) Treating Rily Nickey Primary Care Provider: Guy Begin Provider/Extender: G Referring Provider: Guy Begin Weeks in Treatment: 15 Subjective Chief Complaint Information obtained from Patient the patient is here for follow-up evaluation of her bilateral lower extremity ulcers History of Present Illness (HPI) 06/10/16; this is an 83 year old woman who lives near [place] with family members. She saw her primary doctor roughly over a week ago and was referred to the ER for a cellulitis in the right medial leg. She was given a round  of IV antibiotics in the emergency room and discharged on oral Keflex which she is taking. She is been left with a uncomfortable leg area on the right medial lower leg. Her ABIs in this clinic were 0.8 on the right 0.6 on the left. She does not describe claudication. She is not a  diabetic. Lab work in the ER showed a normal BUN and creatinine on 6/20 at 18 and 0.92 respectively white count was 10.1 hemoglobin at 13.8. She did not have any imaging studies. I can see no vascular evaluations in Epic. She does not have a prior wound history 06/17/16; the area affected on her right medial ankle is an area I thought was due to tissue damage from cellulitis last week. She is going for venous reflux studies this afternoon, these were not ordered in this clinic at least not by me. The patient does not give a prior history of damage to the skin in this area although I wonder if she actually might not of noticed it. We have been using's Aquacel Ag under a wrap although we will not be able to wrap her today 06/24/16: pt reports today she hasn't heard from the vascular clinic regarding proceeding with arterial studies. our office will assist with this today. she denies s/s of infection. denies problems with her wraps. 07/07/16; since I last saw this woman she was admitted to hospital from 7/16 through 7/18 with sepsis syndrome felt to be secondary to her ulcer. She was treated with Vanco and Zosyn the hospital discharged on Septra. Blood cultures were negative. Urine culture showed multiple species she is now feeling well using Santyl with border foam to her wound. She has [facility] 07/14/16; no major improvement here. She has a quarter-sized wound covered with thick adherent slough on the lateral aspect of her right leg with several surrounding satellite lesions in a similar state. She has had 2 recent bouts of cellulitis, one before she came to this clinic and one required a recent hospitalization. I have asked for arterial studies I don't believe these have ever been done. My notes suggest from early in July that she went for reflux studies although I don't know that I have ever seen these either, she did have a DVT rule out but I did not order this, this did not show a DVT  but did show a Baker's cyst 07/21/2016 -- the patient had an arterial duplex study done with waveform suggestive of right femoropopliteal disease. There is also small vessel disease bilaterally. Right ABI was in the normal range of 1.0 and a TBI Hopkins, Jordan J. ([id number]) was 0.74. Left ABI is abnormal at 0.69 with a TBI of 0.40. Three-vessel runoff on the right and a two-vessel runoff on the left. She has an appointment to see Dr. Kirke Corin later today. 07/28/2016 -- was seen by Dr. Kirke Corin who reviewed her arterial studies with an ABI of 0.94 on the right and 0.69 on the left with normal right toe brachial index. Duplex showed diffuse atherosclerosis without discrete stenosis and there was a three-vessel runoff on the right below the knee. Based on these studies and the location of the ulcer he thought it was venous commended she continue with wound care consults. 08/04/16; I note the patient's reasonably functional arterial supply which is fortunate. The wound bed appears to be smaller. Debrided of surface slough. She is been using Iodoflex 08/11/16: Only a very small open area remains. However it has some depth 08/18/16; small open  area is smaller. Still open however. 08/25/16; the area has totally closed down and epithelialized. READMISSION 02/09/17; this is an 83 year old woman who is not a diabetic or smoker. She was in our clinic in the summer into mid-September with a wound on her right medial calf that. This eventually closed down. I thought this was mostly venous. She did have evidence of PAD and she was seen by Dr. Kirke Corin. At that point she had an ABI of 0.94 on the right 0.69 on the left with a normal right toe brachial index. It was felt that she had enough blood flow to close her wound and that happened. She was discharged with stockings although I don't get the sense that she really use them she tells me that roughly a week or 2 she developed swelling in her bilateral lower legs and then  noted wounds on her right lateral malleolus and left medial calf. The on this she is not really sure how this happened. She is not having a lot of pain although she is not ambulatory that much only in her home. Other than weight loss she has not noticed any other issues no specific claudication no chest pain. 02/16/17; the patient continues with 2 wounds on the right lateral malleolus and left medial calf. Both of these are painful. She has chronic venous insufficiency but also known PAD and is previously seen Dr. Kirke Corin. We have arranged vascular follow-up with Dr. Kirke Corin to look at the arterial situation 02/25/2017 -- patient was brought in today because she missed her appointment last week with Dr. Leanord Hawking and is having a lot of pain and drainage from her wounds. The interim she was seen by Dr. Bascom Levels who did a duplex of the right lower extremity which shows a right ABI of 0.95 and left ABI 0.74. There was also a abdominal aortogram with a bilateral extremity angiography done for significant peripheral vascular disease. The right lower extremity showed diffuse SFA and popliteal disease with significant stenosis affecting the TP trunk just before the origin of the peritoneal artery. The left lower extremity showed moderate left SFA stenosis with occluded popliteal artery and tibioperoneal vessels with only visible collateral. The opinion was the patient had no endovascular surgical revascularization options of the left lower extremity and Dr. Bascom Levels would discuss it with Dr. Randie Heinz for a second opinion. Endovascular intervention on the right TP trunk could be considered. 03/03/17; the patient arrives back today having last seen Dr. Meyer Russel. As noted she has severe PAD which is not amenable to either bypass surgery or endovascular interventions on the left. Endovascular interventions in the right TP trunk could be considered. She has large wounds on the right medial leg and a sizable wound on the right  lateral leg. Small wound on the left medial leg. She is really in a lot of discomfort. Using Santyl on the right and I believe Prisma on the left. 03/10/17; the patient arrives today again with large wounds on the right lateral lower extremity, right medial lower extremity. And a small punched out wound on the left medial lower extremity. These are in the setting of severe non-revascularizable PAD. She continues to complain of discomfort although I don't think right now this is unmanageable. We have been using Santyl. We are arranging getting her home health [facility] [name], [name] ([id number]) 03/16/17- patient is here for follow-up evaluation of her bilateral lower extremity ulcers. She has an appointment on 4/11 for an arthrectomy per Dr. Kirke Corin at [facility].  She is continuing to [place]. Her son is doing her dressing changes 03/30/17 still using santyl. sincer her last visit she was admitted from 4/11-4/12 for arthrectomy and balloon angioplasty of the right distal popliteal artery with TP trunk and peritoneal arteries. she is on dual antiplatelet and states her pain is somewhat better on the right 04/13/17 still using santyl to all wounds. Area on the right medial and left lateral look better. Still difficulties with the right lateral wouind. Combination of arterial insufficiency and Venous stasis No revascularization options on the left. Possible interventions on the right (see above) 04/20/17; the patient is still using Santyl to all wound areas. We've had some improvement in the right medial and left lateral. Patient has a combination of arterial and venous insufficiency. No revascularization options are available to the patient on the left however possible interventions on the right. 04/27/17; I continued using Santyl to all wound areas. We've had improvement in the right medial wound and the left lateral wound. The right lateral lower leg wound has remained static. She arrives today  with a lot more edema in her bilateral legs this is pitting. She does not complain of chest pain or shortness of breath tells me she has no prior cardiac issues. 05/12/17; I been using Santyl to all of this lady's wounds. She has an appointment with Dr. Kirke Corin next week. The other issue this week is that apparently her insurance and [employer] are no longer under contract though there was some suggestion that there is another home health company, we can't really determine who that was today. We change the primary dressing to all the wounds to [facility] 05/19/17; patient's appointment with Dr. Kirke Corin is actually next week. We have been using Hydrofera Blue to her wounds 05/26/17 as it turns out Arida's appointment was canceled by his office. It is now in early July. Patient arrives today with a large amount of green drainage coming out of the right lateral leg, she was also discovered by our intake nurse that have an abscess on the left lateral lower leg. We did not previously define a wound in this area Objective Constitutional Sitting or standing Blood Pressure is within target range for patient.. Pulse regular and within target range for patient.Marland Kitchen Respirations regular, non-labored and within target range.. Temperature is normal and within the target range for the patient.Marland Kitchen appears in no distress. Vitals Time Taken: 2:55 PM, Height: 66 in, Weight: 159 lbs, BMI: 25.7, Temperature: 98.5 F, Pulse: 70 bpm, Respiratory Rate: 16 breaths/min, Blood Pressure: 134/61 mmHg. Eyes Conjunctivae clear. No discharge. Respiratory Respiratory effort is easy and symmetric bilaterally. Rate is normal at rest and on room air.Courtney Bellizzi, [name] ([id number]) Cardiovascular Femoral arteries without bruits and pulses strong. Popliteal pulses palpable. Faint pedal pulses. Gastrointestinal (GI) Abdomen is soft and non-distended without masses or tenderness. Bowel sounds active in all  quadrants.. Lymphatic None palpable in the popliteal or inguinal area. Psychiatric No evidence of depression, anxiety, or agitation. Calm, cooperative, and communicative. Appropriate interactions and affect.. General Notes: Wound exam; the area on her right lateral leg was draining green drainage. The wound does not look too much different and there is no surrounding cellulitis nevertheless thick green drainage concern me. Area on the right medial leg looks stable to improved only a small open area remains Area on the left medial leg also looks healthy On the left lateral leg is a raised abscess with significant surrounding cellulitis. A culture of this area was done  Integumentary (Hair, Skin) No rash noted no tenderness in the upper thigh. Wound #2 status is Open. Original cause of wound was Gradually Appeared. The wound is located on the Right,Lateral Malleolus. The wound measures 2.5cm length x 2.8cm width x 0.1cm depth; 5.498cm^2 area and 0.55cm^3 volume. There is Fat Layer (Subcutaneous Tissue) Exposed exposed. There is no tunneling or undermining noted. There is a large amount of serous drainage noted. The wound margin is distinct with the outline attached to the wound base. There is medium (34-66%) red granulation within the wound bed. There is a medium (34-66%) amount of necrotic tissue within the wound bed including Adherent Slough. The periwound skin appearance exhibited: Scarring, Maceration. Periwound temperature was noted as No Abnormality. The periwound has tenderness on palpation. Wound #3 status is Open. Original cause of wound was Gradually Appeared. The wound is located on the Left,Medial Lower Leg. The wound measures 2cm length x 1.8cm width x 0.1cm depth; 2.827cm^2 area and 0.283cm^3 volume. There is Fat Layer (Subcutaneous Tissue) Exposed exposed. There is no tunneling or undermining noted. There is a large amount of serous drainage noted. The wound margin is distinct  with the outline attached to the wound base. There is medium (34-66%) red granulation within the wound bed. There is a small (1-33%) amount of necrotic tissue within the wound bed including Adherent Slough. The periwound skin appearance exhibited: Scarring, Maceration. Periwound temperature was noted as No Abnormality. The periwound has tenderness on palpation. Wound #4 status is Open. Original cause of wound was Gradually Appeared. The wound is located on the Right,Medial Lower Leg. The wound measures 1cm length x 0.8cm width x 0.2cm depth; 0.628cm^2 area and 0.126cm^3 volume. There is Fat Layer (Subcutaneous Tissue) Exposed exposed. There is no tunneling or undermining noted. There is a large amount of serosanguineous drainage noted. The wound margin is distinct with the outline attached to the wound base. There is medium (34-66%) red granulation within the wound bed. There is a medium (34-66%) amount of necrotic tissue within the wound bed including Adherent Slough. The periwound skin appearance exhibited: Scarring, Maceration. Periwound temperature was noted as No Abnormality. The periwound has tenderness on palpation. Wound #5 status is Healed - Epithelialized. Original cause of wound was Gradually Appeared. The wound CRYSTALINA, STODGHILL. ([id number]) is located on the Left,Anterior Lower Leg. The wound measures 0cm length x 0cm width x 0cm depth; 0cm^2 area and 0cm^3 volume. Wound #6 status is Open. Original cause of wound was Bump. The wound is located on the Left,Lateral Lower Leg. The wound measures 0.1cm length x 0.2cm width x 1cm depth; 0.016cm^2 area and 0.016cm^3 volume. There is no tunneling or undermining noted. There is a large amount of purulent drainage noted. The wound margin is indistinct and nonvisible. The periwound skin appearance exhibited: Induration. 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 Cleanse wound with mild soap and water Wound #3 Left,Medial Lower Leg: Clean wound with Normal Saline. Cleanse wound with mild soap and water Wound #4 Right,Medial Lower Leg: Clean wound with Normal Saline. Cleanse wound with mild  soap and water Wound #6 Left,Lateral Lower Leg: Clean wound with Normal Saline. Cleanse wound with mild soap and water Anesthetic: Wound #2 Right,Lateral Malleolus: Topical Lidocaine 4% cream applied to wound bed prior to debridement - clinic use only Wound #3 Left,Medial Lower Leg: Topical Lidocaine 4% cream applied to wound bed prior to debridement - clinic use only Wound #4 Right,Medial Lower Leg: Topical Lidocaine 4% cream applied to wound bed prior to debridement - clinic use only DANYAL, ADORNO. ([id number]) Wound #6 Left,Lateral Lower Leg: Topical Lidocaine 4% cream applied to wound bed prior to debridement - clinic use only Skin Barriers/Peri-Wound Care: Wound #2 Right,Lateral Malleolus: Barrier cream Wound #3 Left,Medial Lower Leg: Barrier cream Wound #4 Right,Medial Lower Leg: Barrier cream Wound #6 Left,Lateral Lower Leg: Barrier cream Primary Wound Dressing: Wound #2 Right,Lateral Malleolus: Aquacel Ag Wound #3 Left,Medial Lower Leg: Aquacel Ag Wound #4 Right,Medial Lower Leg: Aquacel Ag Wound #6 Left,Lateral Lower Leg: Aquacel Ag Secondary Dressing: Wound #2 Right,Lateral Malleolus: Foam Wound #3 Left,Medial Lower Leg: ABD pad Wound #4 Right,Medial Lower Leg: Foam Wound #6 Left,Lateral Lower  Leg: ABD pad Dressing Change Frequency: Wound #2 Right,Lateral Malleolus: Change dressing every week Wound #3 Left,Medial Lower Leg: Change dressing every week Wound #4 Right,Medial Lower Leg: Change dressing every week Wound #6 Left,Lateral Lower Leg: Change dressing every week Follow-up Appointments: Wound #2 Right,Lateral Malleolus: Return Appointment in 1 week. Wound #3 Left,Medial Lower Leg: Return Appointment in 1 week. Wound #4 Right,Medial Lower Leg: Return Appointment in 1 week. Wound #6 Left,Lateral Lower Leg: Return Appointment in 1 week. Edema Control: Wound #2 Right,Lateral Malleolus: Kerlix and Coban - Bilateral - wrap lightly Elevate legs to the level of the heart and pump ankles as often as possible Ziemann, Amari J. ([id number]) Wound #3 Left,Medial Lower Leg: Kerlix and Coban - Bilateral - wrap lightly Elevate legs to the level of the heart and pump ankles as often as possible Wound #4 Right,Medial Lower Leg: Kerlix and Coban - Bilateral - wrap lightly Elevate legs to the level of the heart and pump ankles as often as possible Wound #6 Left,Lateral Lower Leg: Kerlix and Coban - Bilateral - wrap lightly Elevate legs to the level of the heart and pump ankles as often as possible Additional Orders / Instructions: Wound #2 Right,Lateral Malleolus: Increase protein intake. Wound #3 Left,Medial Lower Leg: Increase protein intake. Wound #4 Right,Medial Lower Leg: Increase protein intake. Wound #6 Left,Lateral Lower Leg: Increase protein intake. The following medication(s) was prescribed: doxycycline monohydrate oral 100 mg capsule bid bid capsule oral for cellulitis left leg starting 05/26/2017 Cipro oral 500 mg tablet bid bid tablet oral for cellulitis starting 05/26/2017 #1 there were 2 concerning issues raised by our intake nurse today. Firstly the green drainage coming out of the right lateral wound suggestive of Pseudomonas and also a new small abscess with  surrounding cellulitis on the left lateral leg. This area was cultured for C and S #2 empiric ciprofloxacin and doxycycline 1 #3 the patient does not look to be systemically unwell #4 unfortunately her vascular evaluation with Dr. Kirke Corin is now not to early July. In spite of the vascular insufficiency she seems to be doing fairly well and we've had some improvements in especially the right medial and left medial leg #5 we continued with the Aquacel Ag to all wound areas/ABDs/Kerlix and Coban Electronic Signature(s) Signed: 05/26/2017 5:26:18 PM By: Baltazar Najjar MD Entered By: Baltazar Najjar on 05/26/2017 17:03:35 Thurow, [name] ([id number]) -------------------------------------------------------------------------------- SuperBill Details  Patient Name: KENNLEY, SCHWANDT. Date of Service: 05/26/2017 Medical Record Patient Account Number: [id number] Number: Treating RN: Huel Coventry 1931/02/22 (83 y.o. 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 L97.221 Non-pressure chronic ulcer of left calf limited to breakdown of skin I70.233 Atherosclerosis of native arteries of right leg with ulceration of ankle I70.243 Atherosclerosis of native arteries of left leg with ulceration of ankle L03.115 Cellulitis of right lower limb Facility Procedures CPT4 Code: [id number] Description: 99214 - WOUND CARE VISIT-LEV 4 EST PT Modifier: Quantity: 1 Physician Procedures CPT4: Description Modifier Quantity Code [id number] 99214 - WC PHYS LEVEL 4 - EST PT 1 ICD-10 Description Diagnosis I87.333 Chronic venous hypertension (idiopathic)  with ulcer and inflammation of bilateral lower extremity L97.221 Non-pressure chronic ulcer  of left calf limited to breakdown of skin 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

## 2017-05-29 LAB — AEROBIC CULTURE W GRAM STAIN (SUPERFICIAL SPECIMEN)

## 2017-06-01 ENCOUNTER — Encounter: Payer: Medicare Other | Admitting: Internal Medicine

## 2017-06-01 DIAGNOSIS — I87333 Chronic venous hypertension (idiopathic) with ulcer and inflammation of bilateral lower extremity: Secondary | ICD-10-CM | POA: Diagnosis not present

## 2017-06-02 ENCOUNTER — Ambulatory Visit: Payer: Medicare Other | Admitting: Internal Medicine

## 2017-06-02 NOTE — Progress Notes (Signed)
NUSAIBA, GUALLPA ([id number]) Visit Report for 06/01/2017 Arrival Information Details [name], [name]. Date of Service: 06/01/2017 3:15 PM Medical Record Patient Account Number: [id number] Number: Treating RN: [name] Mar 12, 1931 (83 y.o. Other Clinician: Date of Birth/Sex: Female) Treating ROBSON, MICHAEL Primary Care Giovany Cosby: Guy Begin Vester Titsworth/Extender: G Referring Jerrye Seebeck: [name] in Treatment: 16 Visit Information History Since Last Visit All ordered tests and consults were completed: No Patient Arrived: Wheel Chair Added or deleted any medications: No Arrival Time: 15:17 Any new allergies or adverse reactions: No Accompanied By: son Had a fall or experienced change in No Transfer Assistance: EasyPivot Patient activities of daily living that may affect Lift risk of falls: Patient Identification Verified: Yes Signs or symptoms of abuse/neglect since last No Secondary Verification Process Yes visito Completed: Hospitalized since last visit: No Patient Requires Transmission- No Has Dressing in Place as Prescribed: Yes Based Precautions: Has Compression in Place as Prescribed: Yes Patient Has Alerts: Yes Pain Present Now: No Patient Alerts: Patient on Blood Thinner Electronic Signature(s) Signed: 06/01/2017 5:11:11 PM By: [name] Entered By: [name] on 06/01/2017 15:23:38 Polimeni, [name] ([id number]) -------------------------------------------------------------------------------- Encounter Discharge Information Details [name] Date of Service: 06/01/2017 3:15 PM Medical Record Patient Account Number: [id number] Number: Treating RN: [name] 28-Feb-1931 (83 y.o. Other Clinician: Date of Birth/Sex: Female) Treating ROBSON, MICHAEL Primary Care Seferino Oscar: Guy Begin Saveah Bahar/Extender: G Referring Wm Sahagun: [name] in Treatment: 16 Encounter  Discharge Information Items Discharge Pain Level: 0 Discharge Condition: Stable Ambulatory Status: Wheelchair Discharge Destination: Home Transportation: Private Auto Accompanied By: son Schedule Follow-up Appointment: Yes Medication Reconciliation completed and provided to Patient/Care No Shekela Goodridge: Provided on Clinical Summary of Care: 06/01/2017 Form Type Recipient Paper Patient CF Electronic Signature(s) Signed: 06/01/2017 4:19:07 PM By: Gwenlyn Perking Entered By: Gwenlyn Perking on 06/01/2017 16:19:07 Buice, [name] ([id number]) -------------------------------------------------------------------------------- Lower Extremity Assessment Details [name] Date of Service: 06/01/2017 3:15 PM Medical Record Patient Account Number: [id number] Number: Treating RN: [name] 1931-08-13 (83 y.o. Other Clinician: Date of Birth/Sex: Female) Treating ROBSON, MICHAEL Primary Care Davona Kinoshita: Guy Begin Mechele Kittleson/Extender: G Referring Jeff Mccallum: [name] in Treatment: 16 Vascular Assessment Pulses: Dorsalis Pedis Palpable: [Left:Yes] [Right:Yes] Posterior Tibial Extremity colors, hair growth, and conditions: Extremity Color: [Left:Hyperpigmented] [Right:Hyperpigmented] Temperature of Extremity: [Left:Warm] [Right:Warm] Capillary Refill: [Left:> 3 seconds] [Right:> 3 seconds] Toe Nail Assessment Left: Right: Thick: Yes Yes Discolored: Yes Yes Deformed: No No Improper Length and Hygiene: Yes Yes Electronic Signature(s) Signed: 06/01/2017 5:11:11 PM By: [name] Entered By: [name] on 06/01/2017 15:41:48 Hetz, Charmine Shela Commons ([id number]) -------------------------------------------------------------------------------- Multi Wound Chart Details [name] Date of Service: 06/01/2017 3:15 PM Medical Record Patient Account Number: [id number] Number: Treating RN: [name] 1931-01-23 (83  y.o. Other Clinician: Date of Birth/Sex: Female) Treating ROBSON, MICHAEL Primary Care Shailee Foots: Guy Begin Remington Highbaugh/Extender: G Referring Simar Pothier: Guy Begin Weeks in Treatment: 16 Vital Signs Height(in): 66 Pulse(bpm): 69 Weight(lbs): 159 Blood Pressure 148/61 (mmHg): Body Mass Index(BMI): 26 Temperature(F): 98.2 Respiratory Rate 16 (breaths/min): Photos: [2:No Photos] [3:No Photos] [4:No Photos] Wound Location: [2:Right Malleolus - Lateral] [3:Left Lower Leg - Medial] [4:Right Lower Leg - Medial] Wounding Event: [2:Gradually Appeared] [3:Gradually Appeared] [4:Gradually Appeared] Primary Etiology: [2:Venous Leg Ulcer] [3:Venous Leg Ulcer] [4:Venous Leg Ulcer] Comorbid History: [2:Cataracts, Arrhythmia, Hypertension, Osteoarthritis] [3:Cataracts, Arrhythmia, Hypertension, Osteoarthritis] [4:Cataracts, Arrhythmia, Hypertension, Osteoarthritis] Date Acquired: [2:01/19/2017] [3:01/19/2017] [4:02/16/2017] Weeks of Treatment: [2:16] [3:16] [4:15] Wound Status: [2:Open] [3:Open] [  4:Open] Measurements L x W x D 2.5x2x0.1 [3:0.5x0.4x0.1] [4:2x1.5x0.1] (cm) Area (cm) : [2:3.927] [3:0.157] [4:2.356] Volume (cm) : [2:0.393] [3:0.016] [4:0.236] % Reduction in Area: [2:5.10%] [3:60.10%] [4:-1400.60%] % Reduction in Volume: 5.10% [3:59.00%] [4:-1375.00%] Classification: [2:Full Thickness Without Exposed Support Structures] [3:Full Thickness Without Exposed Support Structures] [4:Full Thickness Without Exposed Support Structures] Exudate Amount: [2:Large] [3:Large] [4:Large] Exudate Type: [2:Serous] [3:Serosanguineous] [4:Serosanguineous] Exudate Color: [2:amber] [3:red, brown] [4:red, brown] Wound Margin: [2:Distinct, outline attached] [3:Distinct, outline attached] [4:Distinct, outline attached] Granulation Amount: [2:Medium (34-66%)] [3:Large (67-100%)] [4:Small (1-33%)] Granulation Quality: [2:Red] [3:Red] [4:Red] Necrotic Amount: [2:Medium (34-66%)] [3:None Present (0%)]  [4:Large (67-100%)] Necrotic Tissue: [2:Adherent Slough] [3:N/A] [4:Eschar, Adherent Slough] Exposed Structures: [name], Jordan J. ([id number]) Fat Layer (Subcutaneous Fat Layer (Subcutaneous Fat Layer (Subcutaneous Tissue) Exposed: Yes Tissue) Exposed: Yes Tissue) Exposed: Yes Fascia: No Tendon: No Muscle: No Joint: No Bone: No Epithelialization: None None None Debridement: Debridement (40981- Debridement (19147- N/A 11047) 11047) Pre-procedure 15:59 15:59 N/A Verification/Time Out Taken: Pain Control: Lidocaine 4% Topical Lidocaine 4% Topical N/A Solution Solution Tissue Debrided: Fibrin/Slough, Exudates, Fibrin/Slough, Exudates, N/A Subcutaneous Subcutaneous Level: Skin/Subcutaneous Skin/Subcutaneous N/A Tissue Tissue Debridement Area (sq 5 0.2 N/A cm): Instrument: Curette Curette N/A Bleeding: Minimum Minimum N/A Hemostasis Achieved: Pressure Pressure N/A Procedural Pain: 0 0 N/A Post Procedural Pain: 0 0 N/A Debridement Treatment Procedure was tolerated Procedure was tolerated N/A Response: well well Post Debridement 2.5x2x0.1 0.5x0.4x0.1 N/A Measurements L x W x D (cm) Post Debridement 0.393 0.016 N/A Volume: (cm) Periwound Skin Texture: Scarring: Yes Scarring: Yes Scarring: Yes Periwound Skin Maceration: Yes Maceration: Yes Maceration: Yes Moisture: Periwound Skin Color: No Abnormalities Noted No Abnormalities Noted No Abnormalities Noted Erythema Location: N/A N/A N/A Temperature: No Abnormality No Abnormality No Abnormality Tenderness on Yes Yes Yes Palpation: Wound Preparation: Ulcer Cleansing: Ulcer Cleansing: Ulcer Cleansing: Rinsed/Irrigated with Rinsed/Irrigated with Rinsed/Irrigated with Saline, Other: soap and Saline, Other: soap and Saline, Other: soap and water water water Topical Anesthetic Topical Anesthetic Topical Anesthetic Applied: Other: lidocaine Applied: Other: lidocaine Applied: Other: lidocaine 4% 4% 4% Procedures Performed:  Debridement Debridement N/A DARYN, PISANI ([id number]) Wound Number: 6 N/A N/A Photos: No Photos N/A N/A Wound Location: Left Lower Leg - Lateral N/A N/A Wounding Event: Bump N/A N/A Primary Etiology: Abscess N/A N/A Comorbid History: Cataracts, Arrhythmia, N/A N/A Hypertension, Osteoarthritis Date Acquired: 05/26/2017 N/A N/A Weeks of Treatment: 0 N/A N/A Wound Status: Open N/A N/A Measurements L x W x D 0.5x0.5x0.2 N/A N/A (cm) Area (cm) : 0.196 N/A N/A Volume (cm) : 0.039 N/A N/A % Reduction in Area: -1125.00% N/A N/A % Reduction in Volume: -143.70% N/A N/A Classification: Full Thickness Without N/A N/A Exposed Support Structures Exudate Amount: Large N/A N/A Exudate Type: Serous N/A N/A Exudate Color: amber N/A N/A Wound Margin: Indistinct, nonvisible N/A N/A Granulation Amount: None Present (0%) N/A N/A Granulation Quality: N/A N/A N/A Necrotic Amount: Large (67-100%) N/A N/A Necrotic Tissue: Adherent Slough N/A N/A Exposed Structures: N/A N/A N/A Epithelialization: Large (67-100%) N/A N/A Debridement: N/A N/A N/A Pain Control: N/A N/A N/A Tissue Debrided: N/A N/A N/A Level: N/A N/A N/A Debridement Area (sq N/A N/A N/A cm): Instrument: N/A N/A N/A Bleeding: N/A N/A N/A Hemostasis Achieved: N/A N/A N/A Procedural Pain: N/A N/A N/A Post Procedural Pain: N/A N/A N/A Debridement Treatment N/A N/A N/A Response: Post Debridement N/A N/A N/A Measurements L x W x D (cm) Post Debridement N/A N/A N/A Volume: (cm) [name], Jordan J. ([id number]) Periwound Skin Texture: Induration: Yes N/A N/A Periwound  Skin No Abnormalities Noted N/A N/A Moisture: Periwound Skin Color: Erythema: Yes N/A N/A Ecchymosis: No Erythema Location: Circumferential N/A N/A Temperature: N/A N/A N/A Tenderness on No N/A N/A Palpation: Wound Preparation: Ulcer Cleansing: Other: N/A N/A soap and water Topical Anesthetic Applied: Other: lidocaine 4% Procedures Performed: N/A N/A  N/A Treatment Notes Wound #2 (Right, Lateral Malleolus) 1. Cleansed with: Clean wound with Normal Saline Cleanse wound with antibacterial soap and water 2. Anesthetic Topical Lidocaine 4% cream to wound bed prior to debridement 3. Peri-wound Care: Barrier cream 4. Dressing Applied: Aquacel Ag 5. Secondary Dressing Applied ABD Pad Dry Gauze Foam 7. Secured with Tape Notes kerlix, coban Wound #3 (Left, Medial Lower Leg) 1. Cleansed with: Clean wound with Normal Saline Cleanse wound with antibacterial soap and water 2. Anesthetic Topical Lidocaine 4% cream to wound bed prior to debridement 3. Peri-wound Care: Barrier cream 4. Dressing Applied: Aquacel Ag Jordan BandaFULLER, Jordan J. ([id number]) 5. Secondary Dressing Applied ABD Pad Dry Gauze Foam 7. Secured with Tape Notes kerlix, coban Wound #4 (Right, Medial Lower Leg) 1. Cleansed with: Clean wound with Normal Saline Cleanse wound with antibacterial soap and water 2. Anesthetic Topical Lidocaine 4% cream to wound bed prior to debridement 3. Peri-wound Care: Barrier cream 4. Dressing Applied: Aquacel Ag 5. Secondary Dressing Applied ABD Pad Dry Gauze Foam 7. Secured with Tape Notes kerlix, coban Wound #6 (Left, Lateral Lower Leg) 1. Cleansed with: Clean wound with Normal Saline Cleanse wound with antibacterial soap and water 2. Anesthetic Topical Lidocaine 4% cream to wound bed prior to debridement 3. Peri-wound Care: Barrier cream 4. Dressing Applied: Aquacel Ag 5. Secondary Dressing Applied ABD Pad Dry Gauze Foam 7. Secured with Tape Notes Ermalene Searingkerlix, coban [name], Jordan J. ([id number]) Electronic Signature(s) Signed: 06/01/2017 4:47:51 PM By: Baltazar Najjarobson, Michael MD Entered By: Baltazar Najjarobson, Michael on 06/01/2017 16:32:46 [name], [name] ([id number]) -------------------------------------------------------------------------------- Multi-Disciplinary Care Plan Details Patient Name: Jordan BandaFULLER, Markea J. Date of  Service: 06/01/2017 3:15 PM Medical Record Patient Account Number: [id number] Number: Treating RN: Phillis Haggisinkerton, Debi 24-Feb-1931 (83 y.o. Other Clinician: Date of Birth/Sex: Female) Treating ROBSON, MICHAEL Primary Care Gregorio Worley: [name], NARGES Kamisha Ell/Extender: G Referring Wafa Martes: Gabriel RungFARAHI, NARGES Weeks in Treatment: 16 Active Inactive ` Abuse / Safety / Falls / Self Care Management Nursing Diagnoses: Potential for falls Goals: Patient will remain injury free Date Initiated: 02/09/2017 Target Resolution Date: 04/17/2017 Goal Status: Active Interventions: Assess fall risk on admission and as needed Assess self care needs on admission and as needed Notes: ` Nutrition Nursing Diagnoses: Imbalanced nutrition Goals: Patient/caregiver agrees to and verbalizes understanding of need to use nutritional supplements and/or vitamins as prescribed Date Initiated: 02/09/2017 Target Resolution Date: 04/17/2017 Goal Status: Active Interventions: Assess patient nutrition upon admission and as needed per policy Notes: ` Orientation to the Wound Care Program Jordan BandaFULLER, Loletha J. ([id number]) Nursing Diagnoses: Knowledge deficit related to the wound healing center program Goals: Patient/caregiver will verbalize understanding of the Wound Healing Center Program Date Initiated: 02/09/2017 Target Resolution Date: 02/20/2017 Goal Status: Active Interventions: Provide education on orientation to the wound center Notes: ` Pain, Acute or Chronic Nursing Diagnoses: Pain, acute or chronic: actual or potential Potential alteration in comfort, pain Goals: Patient/caregiver will verbalize adequate pain control between visits Date Initiated: 02/09/2017 Target Resolution Date: 04/17/2017 Goal Status: Active Interventions: Assess comfort goal upon admission Complete pain assessment as per visit requirements Notes: ` Wound/Skin Impairment Nursing Diagnoses: Impaired tissue integrity Knowledge  deficit related to smoking impact on wound healing Knowledge deficit related to ulceration/compromised  skin integrity Goals: Ulcer/skin breakdown will have a volume reduction of 80% by week 12 Date Initiated: 02/09/2017 Target Resolution Date: 04/10/2017 Goal Status: Active Interventions: Assess patient/caregiver ability to perform ulcer/skin care regimen upon admission and as needed NYREE, APPLEGATE ([id number]) Assess ulceration(s) every visit Notes: Electronic Signature(s) Signed: 06/01/2017 5:11:11 PM By: [name] Entered By: [name] on 06/01/2017 15:44:48 [name], [name] ([id number]) -------------------------------------------------------------------------------- Pain Assessment Details [name] Date of Service: 06/01/2017 3:15 PM Medical Record Patient Account Number: [id number] Number: Treating RN: [name] 04-29-1931 (83 y.o. Other Clinician: Date of Birth/Sex: Female) Treating ROBSON, MICHAEL Primary Care Aylan Bayona: Guy Begin Reymundo Winship/Extender: G Referring Darion Milewski: [name] in Treatment: 16 Active Problems Location of Pain Severity and Description of Pain Patient Has Paino No Site Locations With Dressing Change: No Pain Management and Medication Current Pain Management: Electronic Signature(s) Signed: 06/01/2017 5:11:11 PM By: [name] Entered By: [name] on 06/01/2017 15:24:04 [name], [name] ([id number]) -------------------------------------------------------------------------------- Patient/Caregiver Education Details [name] Date of Service: 06/01/2017 3:15 PM Medical Record Patient Account Number: [id number] Number: Treating RN: [name] 11-Mar-1931 (83 y.o. Other Clinician: Date of Birth/Gender: Female) Treating ROBSON, MICHAEL Primary Care Physician: Guy Begin Physician/Extender: G Referring Physician: [name]  in Treatment: 16 Education Assessment Education Provided To: Patient Education Topics Provided Wound/Skin Impairment: Handouts: Other: change dressing as ordered Methods: Demonstration, Explain/Verbal Responses: State content correctly Electronic Signature(s) Signed: 06/01/2017 5:11:11 PM By: [name] Entered By: [name] on 06/01/2017 15:45:47 [name], [name] ([id number]) -------------------------------------------------------------------------------- Wound Assessment Details [name] Date of Service: 06/01/2017 3:15 PM Medical Record Patient Account Number: [id number] Number: Treating RN: [name] 1931-07-05 (83 y.o. Other Clinician: Date of Birth/Sex: Female) Treating ROBSON, MICHAEL Primary Care Katera Rybka: Guy Begin Brissia Delisa/Extender: G Referring Ariam Mol: Guy Begin Weeks in Treatment: 16 Wound Status Wound Number: 2 Primary Venous Leg Ulcer Etiology: Wound Location: Right Malleolus - Lateral Wound Status: Open Wounding Event: Gradually Appeared Comorbid Cataracts, Arrhythmia, Hypertension, Date Acquired: 01/19/2017 History: Osteoarthritis Weeks Of Treatment: 16 Clustered Wound: No Photos Photo Uploaded By: [name] on 06/01/2017 16:59:43 Wound Measurements Length: (cm) 2.5 Width: (cm) 2 Depth: (cm) 0.1 Area: (cm) 3.927 Volume: (cm) 0.393 % Reduction in Area: 5.1% % Reduction in Volume: 5.1% Epithelialization: None Tunneling: No Undermining: No Wound Description Full Thickness Without Exposed Classification: Support Structures Wound Margin: Distinct, outline attached Exudate Large Amount: Exudate Type: Serous Exudate Color: amber Foul Odor After Cleansing: No Slough/Fibrino Yes Wound Bed Granulation Amount: Medium (34-66%) Exposed Structure Persad, Mable J. ([id number]) Granulation Quality: Red Fat Layer (Subcutaneous Tissue) Exposed: Yes Necrotic Amount: Medium  (34-66%) Necrotic Quality: Adherent Slough Periwound Skin Texture Texture Color No Abnormalities Noted: No No Abnormalities Noted: No Scarring: Yes Temperature / Pain Moisture Temperature: No Abnormality No Abnormalities Noted: No Tenderness on Palpation: Yes Maceration: Yes Wound Preparation Ulcer Cleansing: Rinsed/Irrigated with Saline, Other: soap and water, Topical Anesthetic Applied: Other: lidocaine 4%, Treatment Notes Wound #2 (Right, Lateral Malleolus) 1. Cleansed with: Clean wound with Normal Saline Cleanse wound with antibacterial soap and water 2. Anesthetic Topical Lidocaine 4% cream to wound bed prior to debridement 3. Peri-wound Care: Barrier cream 4. Dressing Applied: Aquacel Ag 5. Secondary Dressing Applied Dry Gauze Foam 7. Secured with Tape Notes kerlix, coban Electronic Signature(s) Signed: 06/01/2017 5:11:11 PM By: [name] Entered By: [name] on 06/01/2017 15:37:07 [name], [name] ([id number]) -------------------------------------------------------------------------------- Wound Assessment Details [name] Date  of Service: 06/01/2017 3:15 PM Medical Record Patient Account Number: [id number] Number: Treating RN: [name] [birth year] (83 y.o. Other Clinician: Date of Birth/Sex: Female) Treating ROBSON, MICHAEL Primary Care Tavaughn Silguero: Guy Begin Denaisha Swango/Extender: G Referring Steven Veazie: Guy Begin Weeks in Treatment: 16 Wound Status Wound Number: 3 Primary Venous Leg Ulcer Etiology: Wound Location: Left Lower Leg - Medial Wound Status: Open Wounding Event: Gradually Appeared Comorbid Cataracts, Arrhythmia, Hypertension, Date Acquired: 01/19/2017 History: Osteoarthritis Weeks Of Treatment: 16 Clustered Wound: No Photos Photo Uploaded By: [name] on 06/01/2017 16:59:44 Wound Measurements Length: (cm) 0.5 Width: (cm) 0.4 Depth: (cm) 0.1 Area: (cm) 0.157 Volume:  (cm) 0.016 % Reduction in Area: 60.1% % Reduction in Volume: 59% Epithelialization: None Tunneling: No Undermining: No Wound Description Full Thickness Without Exposed Classification: Support Structures Wound Margin: Distinct, outline attached Exudate Large Amount: Exudate Type: Serosanguineous Exudate Color: red, brown Foul Odor After Cleansing: No Slough/Fibrino Yes Wound Bed Granulation Amount: Large (67-100%) Exposed Structure [name], [name] J. ([id number]) Granulation Quality: Red Fat Layer (Subcutaneous Tissue) Exposed: Yes Necrotic Amount: None Present (0%) Periwound Skin Texture Texture Color No Abnormalities Noted: No No Abnormalities Noted: No Scarring: Yes Temperature / Pain Moisture Temperature: No Abnormality No Abnormalities Noted: No Tenderness on Palpation: Yes Maceration: Yes Wound Preparation Ulcer Cleansing: Rinsed/Irrigated with Saline, Other: soap and water, Topical Anesthetic Applied: Other: lidocaine 4%, Treatment Notes Wound #3 (Left, Medial Lower Leg) 1. Cleansed with: Clean wound with Normal Saline Cleanse wound with antibacterial soap and water 2. Anesthetic Topical Lidocaine 4% cream to wound bed prior to debridement 3. Peri-wound Care: Barrier cream 4. Dressing Applied: Aquacel Ag 5. Secondary Dressing Applied Dry Gauze Foam 7. Secured with Tape Notes kerlix, coban Electronic Signature(s) Signed: 06/01/2017 5:11:11 PM By: [name] Entered By: [name] on 06/01/2017 15:37:44 Weisenburger, [name] ([id number]) -------------------------------------------------------------------------------- Wound Assessment Details [name] Date of Service: 06/01/2017 3:15 PM Medical Record Patient Account Number: [id number] Number: Treating RN: [name] Nov 02, 1931 (83 y.o. Other Clinician: Date of Birth/Sex: Female) Treating ROBSON, MICHAEL Primary Care Zlaty Alexa: Guy Begin Frank Novelo/Extender: G Referring Kinnie Kaupp: Guy Begin Weeks in Treatment: 16 Wound Status Wound Number: 4 Primary Venous Leg Ulcer Etiology: Wound Location: Right Lower Leg - Medial Wound Status: Open Wounding Event: Gradually Appeared Comorbid Cataracts, Arrhythmia, Hypertension, Date Acquired: 02/16/2017 History: Osteoarthritis Weeks Of Treatment: 15 Clustered Wound: No Photos Photo Uploaded By: [name] on 06/01/2017 17:00:18 Wound Measurements Length: (cm) 2 Width: (cm) 1.5 Depth: (cm) 0.1 Area: (cm) 2.356 Volume: (cm) 0.236 % Reduction in Area: -1400.6% % Reduction in Volume: -1375% Epithelialization: None Tunneling: No Undermining: No Wound Description Full Thickness Without Exposed Classification: Support Structures Wound Margin: Distinct, outline attached Exudate Large Amount: Exudate Type: Serosanguineous Exudate Color: red, brown Foul Odor After Cleansing: No Slough/Fibrino Yes Wound Bed Granulation Amount: Small (1-33%) Exposed Structure Geiler, Yanin J. ([id number]) Granulation Quality: Red Fascia Exposed: No Necrotic Amount: Large (67-100%) Fat Layer (Subcutaneous Tissue) Exposed: Yes Necrotic Quality: Eschar, Adherent Slough Tendon Exposed: No Muscle Exposed: No Joint Exposed: No Bone Exposed: No Periwound Skin Texture Texture Color No Abnormalities Noted: No No Abnormalities Noted: No Scarring: Yes Temperature / Pain Moisture Temperature: No Abnormality No Abnormalities Noted: No Tenderness on Palpation: Yes Maceration: Yes Wound Preparation Ulcer Cleansing: Rinsed/Irrigated with Saline, Other: soap and water, Topical Anesthetic Applied: Other: lidocaine 4%, Treatment Notes Wound #4 (Right, Medial Lower Leg) 1. Cleansed with: Clean wound with Normal Saline Cleanse wound with antibacterial soap and water 2. Anesthetic Topical  Lidocaine 4% cream to wound bed prior to debridement 3. Peri-wound Care: Barrier  cream 4. Dressing Applied: Aquacel Ag 5. Secondary Dressing Applied Dry Gauze Foam 7. Secured with Tape Notes kerlix, coban Electronic Signature(s) Signed: 06/01/2017 5:11:11 PM By: [name] Entered By: [name] on 06/01/2017 15:38:26 Royal, [name] ([id number]) -------------------------------------------------------------------------------- Wound Assessment Details [name] Date of Service: 06/01/2017 3:15 PM Medical Record Patient Account Number: [id number] Number: Treating RN: [name] 05-Jun-1931 (83 y.o. Other Clinician: Date of Birth/Sex: Female) Treating ROBSON, MICHAEL Primary Care Pernell Lenoir: Guy Begin Lynel Forester/Extender: G Referring Roshan Salamon: Guy Begin Weeks in Treatment: 16 Wound Status Wound Number: 6 Primary Abscess Etiology: Wound Location: Left Lower Leg - Lateral Wound Status: Open Wounding Event: Bump Comorbid Cataracts, Arrhythmia, Hypertension, Date Acquired: 05/26/2017 History: Osteoarthritis Weeks Of Treatment: 0 Clustered Wound: No Photos Photo Uploaded By: [name] on 06/01/2017 17:00:18 Wound Measurements Length: (cm) 0.5 Width: (cm) 0.5 Depth: (cm) 0.2 Area: (cm) 0.196 Volume: (cm) 0.039 % Reduction in Area: -1125% % Reduction in Volume: -143.7% Epithelialization: Large (67-100%) Tunneling: No Undermining: No Wound Description Full Thickness Without Exposed Foul Odor After Classification: Support Structures Slough/Fibrino Wound Margin: Indistinct, nonvisible Exudate Large Amount: Exudate Type: Serous Exudate Color: amber Cleansing: No No Wound Bed Granulation Amount: None Present (0%) Kincade, Veleka J. ([id number]) Necrotic Amount: Large (67-100%) Necrotic Quality: Adherent Slough Periwound Skin Texture Texture Color No Abnormalities Noted: No No Abnormalities Noted: No Induration: Yes Ecchymosis: No Erythema: Yes Moisture Erythema Location:  Circumferential No Abnormalities Noted: No Wound Preparation Ulcer Cleansing: Other: soap and water, Topical Anesthetic Applied: Other: lidocaine 4%, Treatment Notes Wound #6 (Left, Lateral Lower Leg) 1. Cleansed with: Clean wound with Normal Saline Cleanse wound with antibacterial soap and water 2. Anesthetic Topical Lidocaine 4% cream to wound bed prior to debridement 3. Peri-wound Care: Barrier cream 4. Dressing Applied: Aquacel Ag 5. Secondary Dressing Applied Dry Gauze Foam 7. Secured with Tape Notes kerlix, coban Electronic Signature(s) Signed: 06/01/2017 5:11:11 PM By: [name] Entered By: [name] on 06/01/2017 15:39:35 Pote, [name] ([id number]) -------------------------------------------------------------------------------- Vitals Details [name] Date of Service: 06/01/2017 3:15 PM Medical Record Patient Account Number: [id number] Number: Treating RN: [name] 12-Oct-1931 (83 y.o. Other Clinician: Date of Birth/Sex: Female) Treating ROBSON, MICHAEL Primary Care Nikeia Henkes: Guy Begin Malaney Mcbean/Extender: G Referring Lexx Monte: [name] in Treatment: 16 Vital Signs Time Taken: 15:24 Temperature (F): 98.2 Height (in): 66 Pulse (bpm): 69 Weight (lbs): 159 Respiratory Rate (breaths/min): 16 Body Mass Index (BMI): 25.7 Blood Pressure (mmHg): 148/61 Reference Range: 80 - 120 mg / dl Electronic Signature(s) Signed: 06/01/2017 5:11:11 PM By: [name] Entered By: [name] on 06/01/2017 15:24:33

## 2017-06-03 NOTE — Progress Notes (Signed)
JANELLA, ROGALA ([id number]) Visit Report for 06/01/2017 Chief Complaint Document Details [name], [name]. Date of Service: 06/01/2017 3:15 PM Medical Record Patient Account Number: [id number] Number: Treating RN: [name] 1931/07/10 (83 y.o. Other Clinician: Date of Birth/Sex: Female) Treating Ostin Mathey Primary Care Provider: Guy Begin Provider/Extender: G Referring Provider: [name] in Treatment: 16 Information Obtained from: Patient Chief Complaint the patient is here for follow-up evaluation of her bilateral lower extremity ulcers Electronic Signature(s) Signed: 06/01/2017 4:47:51 PM By: Baltazar Najjar MD Entered By: Baltazar Najjar on 06/01/2017 16:33:29 Arata, [name] ([id number]) -------------------------------------------------------------------------------- Debridement Details [name] Date of Service: 06/01/2017 3:15 PM Medical Record Patient Account Number: [id number] Number: Treating RN: [name] 23-Dec-1930 (83 y.o. Other Clinician: Date of Birth/Sex: Female) Treating Kache Mcclurg Primary Care Provider: Guy Begin Provider/Extender: G Referring Provider: [name] in Treatment: 16 Debridement Performed for Wound #2 Right,Lateral Malleolus Assessment: Performed By: Physician Maxwell Caul, MD Debridement: Debridement Severity of Tissue Pre Fat layer exposed Debridement: Pre-procedure Verification/Time Out Yes - 15:59 Taken: Start Time: 16:00 Pain Control: Lidocaine 4% Topical Solution Level: Skin/Subcutaneous Tissue Total Area Debrided (L x 2.5 (cm) x 2 (cm) = 5 (cm) W): Tissue and other Viable, Non-Viable, Exudate, Fibrin/Slough, Subcutaneous material debrided: Instrument: Curette Bleeding: Minimum Hemostasis Achieved: Pressure End Time: 16:02 Procedural Pain: 0 Post Procedural Pain: 0 Response to Treatment: Procedure was tolerated  well Post Debridement Measurements of Total Wound Length: (cm) 2.5 Width: (cm) 2 Depth: (cm) 0.1 Volume: (cm) 0.393 Character of Wound/Ulcer Post Requires Further Debridement Debridement: Severity of Tissue Post Debridement: Fat layer exposed Post Procedure Diagnosis Same as Pre-procedure Electronic Signature(s) TARA, RUD ([id number]) Signed: 06/01/2017 4:47:51 PM By: Baltazar Najjar MD Signed: 06/01/2017 5:11:11 PM By: [name] Entered By: Baltazar Najjar on 06/01/2017 16:33:08 Lafoy, [name] ([id number]) -------------------------------------------------------------------------------- Debridement Details [name] Date of Service: 06/01/2017 3:15 PM Medical Record Patient Account Number: [id number] Number: Treating RN: [name] [birth year] (83 y.o. Other Clinician: Date of Birth/Sex: Female) Treating Chriss Redel Primary Care Provider: Guy Begin Provider/Extender: G Referring Provider: [name] in Treatment: 16 Debridement Performed for Wound #3 Left,Medial Lower Leg Assessment: Performed By: Physician Maxwell Caul, MD Debridement: Debridement Severity of Tissue Pre Fat layer exposed Debridement: Pre-procedure Verification/Time Out Yes - 15:59 Taken: Start Time: 16:02 Pain Control: Lidocaine 4% Topical Solution Level: Skin/Subcutaneous Tissue Total Area Debrided (L x 0.5 (cm) x 0.4 (cm) = 0.2 (cm) W): Tissue and other Viable, Non-Viable, Exudate, Fibrin/Slough, Subcutaneous material debrided: Instrument: Curette Bleeding: Minimum Hemostasis Achieved: Pressure End Time: 16:04 Procedural Pain: 0 Post Procedural Pain: 0 Response to Treatment: Procedure was tolerated well Post Debridement Measurements of Total Wound Length: (cm) 0.5 Width: (cm) 0.4 Depth: (cm) 0.1 Volume: (cm) 0.016 Character of Wound/Ulcer Post Requires Further Debridement Debridement: Severity of Tissue  Post Debridement: Fat layer exposed Post Procedure Diagnosis Same as Pre-procedure Electronic Signature(s) AIREL, MAGADAN ([id number]) Signed: 06/01/2017 4:47:51 PM By: Baltazar Najjar MD Signed: 06/01/2017 5:11:11 PM By: [name] Entered By: Baltazar Najjar on 06/01/2017 16:33:20 Avitia, [name] ([id number]) -------------------------------------------------------------------------------- HPI Details [name] Date of Service: 06/01/2017 3:15 PM Medical Record Patient Account Number: [id number] Number: Treating RN: [name] 1931/11/29 (83 y.o. Other Clinician: Date of Birth/Sex: Female) Treating Terica Yogi Primary Care Provider: Guy Begin Provider/Extender: G Referring Provider: Guy Begin Weeks in Treatment: 16 History of Present Illness HPI Description: 06/10/16; this  is an 83 year old woman who lives near [place] with family members. She saw her primary doctor roughly over a week ago and was referred to the ER for a cellulitis in the right medial leg. She was given a round of IV antibiotics in the emergency room and discharged on oral Keflex which she is taking. She is been left with a uncomfortable leg area on the right medial lower leg. Her ABIs in this clinic were 0.8 on the right 0.6 on the left. She does not describe claudication. She is not a diabetic. Lab work in the ER showed a normal BUN and creatinine on 6/20 at 18 and 0.92 respectively white count was 10.1 hemoglobin at 13.8. She did not have any imaging studies. I can see no vascular evaluations in Epic. She does not have a prior wound history 06/17/16; the area affected on her right medial ankle is an area I thought was due to tissue damage from cellulitis last week. She is going for venous reflux studies this afternoon, these were not ordered in this clinic at least not by me. The patient does not give a prior history of damage to the skin in this area although  I wonder if she actually might not of noticed it. We have been using's Aquacel Ag under a wrap although we will not be able to wrap her today 06/24/16: pt reports today she hasn't heard from the vascular clinic regarding proceeding with arterial studies. our office will assist with this today. she denies s/s of infection. denies problems with her wraps. 07/07/16; since I last saw this woman she was admitted to hospital from 7/16 through 7/18 with sepsis syndrome felt to be secondary to her ulcer. She was treated with Vanco and Zosyn the hospital discharged on Septra. Blood cultures were negative. Urine culture showed multiple species she is now feeling well using Santyl with border foam to her wound. She has [facility] 07/14/16; no major improvement here. She has a quarter-sized wound covered with thick adherent slough on the lateral aspect of her right leg with several surrounding satellite lesions in a similar state. She has had 2 recent bouts of cellulitis, one before she came to this clinic and one required a recent hospitalization. I have asked for arterial studies I don't believe these have ever been done. My notes suggest from early in July that she went for reflux studies although I don't know that I have ever seen these either, she did have a DVT rule out but I did not order this, this did not show a DVT but did show a Baker's cyst 07/21/2016 -- the patient had an arterial duplex study done with waveform suggestive of right femoropopliteal disease. There is also small vessel disease bilaterally. Right ABI was in the normal range of 1.0 and a TBI was 0.74. Left ABI is abnormal at 0.69 with a TBI of 0.40. Three-vessel runoff on the right and a two-vessel runoff on the left. She has an appointment to see Dr. Kirke Corin later today. 07/28/2016 -- was seen by Dr. Kirke Corin who reviewed her arterial studies with an ABI of 0.94 on the right and 0.69 on the left with normal right toe brachial index.  Duplex showed diffuse atherosclerosis without discrete Smarr, Geselle J. ([id number]) stenosis and there was a three-vessel runoff on the right below the knee. Based on these studies and the location of the ulcer he thought it was venous commended she continue with wound care consults. 08/04/16; I note the  patient's reasonably functional arterial supply which is fortunate. The wound bed appears to be smaller. Debrided of surface slough. She is been using Iodoflex 08/11/16: Only a very small open area remains. However it has some depth 08/18/16; small open area is smaller. Still open however. 08/25/16; the area has totally closed down and epithelialized. READMISSION 02/09/17; this is an 83 year old woman who is not a diabetic or smoker. She was in our clinic in the summer into mid-September with a wound on her right medial calf that. This eventually closed down. I thought this was mostly venous. She did have evidence of PAD and she was seen by Dr. Kirke Corin. At that point she had an ABI of 0.94 on the right 0.69 on the left with a normal right toe brachial index. It was felt that she had enough blood flow to close her wound and that happened. She was discharged with stockings although I don't get the sense that she really use them she tells me that roughly a week or 2 she developed swelling in her bilateral lower legs and then noted wounds on her right lateral malleolus and left medial calf. The on this she is not really sure how this happened. She is not having a lot of pain although she is not ambulatory that much only in her home. Other than weight loss she has not noticed any other issues no specific claudication no chest pain. 02/16/17; the patient continues with 2 wounds on the right lateral malleolus and left medial calf. Both of these are painful. She has chronic venous insufficiency but also known PAD and is previously seen Dr. Kirke Corin. We have arranged vascular follow-up with Dr. Kirke Corin to look at the  arterial situation 02/25/2017 -- patient was brought in today because she missed her appointment last week with Dr. Leanord Hawking and is having a lot of pain and drainage from her wounds. The interim she was seen by Dr. Bascom Levels who did a duplex of the right lower extremity which shows a right ABI of 0.95 and left ABI 0.74. There was also a abdominal aortogram with a bilateral extremity angiography done for significant peripheral vascular disease. The right lower extremity showed diffuse SFA and popliteal disease with significant stenosis affecting the TP trunk just before the origin of the peritoneal artery. The left lower extremity showed moderate left SFA stenosis with occluded popliteal artery and tibioperoneal vessels with only visible collateral. The opinion was the patient had no endovascular surgical revascularization options of the left lower extremity and Dr. Bascom Levels would discuss it with Dr. Randie Heinz for a second opinion. Endovascular intervention on the right TP trunk could be considered. 03/03/17; the patient arrives back today having last seen Dr. Meyer Russel. As noted she has severe PAD which is not amenable to either bypass surgery or endovascular interventions on the left. Endovascular interventions in the right TP trunk could be considered. She has large wounds on the right medial leg and a sizable wound on the right lateral leg. Small wound on the left medial leg. She is really in a lot of discomfort. Using Santyl on the right and I believe Prisma on the left. 03/10/17; the patient arrives today again with large wounds on the right lateral lower extremity, right medial lower extremity. And a small punched out wound on the left medial lower extremity. These are in the setting of severe non-revascularizable PAD. She continues to complain of discomfort although I don't think right now this is unmanageable. We have been using Santyl. We are  arranging getting her home health [[facility]] 03/16/17-  patient is here for follow-up evaluation of her bilateral lower extremity ulcers. She has an appointment on 4/11 for an arthrectomy per Dr. Kirke Corin at [facility]. She is continuing to [place]. Her son is doing her dressing changes 03/30/17 still using santyl. sincer her last visit she was admitted from 4/11-4/12 for arthrectomy and balloon angioplasty of the right distal popliteal artery with TP trunk and peritoneal arteries. she is on dual antiplatelet KINJAL, NEITZKE. ([id number]) and states her pain is somewhat better on the right 04/13/17 still using santyl to all wounds. Area on the right medial and left lateral look better. Still difficulties with the right lateral wouind. Combination of arterial insufficiency and Venous stasis No revascularization options on the left. Possible interventions on the right (see above) 04/20/17; the patient is still using Santyl to all wound areas. We've had some improvement in the right medial and left lateral. Patient has a combination of arterial and venous insufficiency. No revascularization options are available to the patient on the left however possible interventions on the right. 04/27/17; I continued using Santyl to all wound areas. We've had improvement in the right medial wound and the left lateral wound. The right lateral lower leg wound has remained static. She arrives today with a lot more edema in her bilateral legs this is pitting. She does not complain of chest pain or shortness of breath tells me she has no prior cardiac issues. 05/12/17; I been using Santyl to all of this lady's wounds. She has an appointment with Dr. Kirke Corin next week. The other issue this week is that apparently her insurance and [facility] are no longer under contract though there was some suggestion that there is another home health company, we can't really determine who that was today. We change the primary dressing to all the wounds to [facility] 05/19/17; patient's  appointment with Dr. Kirke Corin is actually next week. We have been using Hydrofera Blue to her wounds 05/26/17 as it turns out Arida's appointment was canceled by his office. It is now in early July. Patient arrives today with a large amount of green drainage coming out of the right lateral leg, she was also discovered by our intake nurse that have an abscess on the left lateral lower leg. We did not previously define a wound in this area. 06/01/17; culture of the abscess on the left lateral leg we did last week showed a rib fairly resistant MRSA. I brought the patient in early today because of this. I had put her on Doxy and Cipro last week for this and also green drainage coming out of the right lateral wound. Fortunately all the wounds today looks somewhat better. We have been using Aquacel Ag Electronic Signature(s) Signed: 06/01/2017 4:47:51 PM By: Baltazar Najjar MD Entered By: Baltazar Najjar on 06/01/2017 16:35:14 Longenecker, [name] ([id number]) -------------------------------------------------------------------------------- Physical Exam Details Patient Name: JOSELIN, CRANDELL. Date of Service: 06/01/2017 3:15 PM Medical Record Patient Account Number: [id number] Number: Treating RN: [name] Aug 29, 1931 (83 y.o. Other Clinician: Date of Birth/Sex: Female) Treating Wylma Tatem Primary Care Provider: Guy Begin Provider/Extender: G Referring Provider: Guy Begin Weeks in Treatment: 16 Constitutional Sitting or standing Blood Pressure is within target range for patient.. Pulse regular and within target range for patient.Marland Kitchen Respirations regular, non-labored and within target range.. Temperature is normal and within the target range for the patient.Marland Kitchen appears in no distress. Eyes Conjunctivae clear. No discharge. Respiratory Respiratory effort is  easy and symmetric bilaterally. Rate is normal at rest and on room air.. Cardiovascular Pedal pulses absent  bilaterally.. Lymphatic None palpable in the popliteal and inguinal area. Notes Wound exam; the area on her right lateral lower leg actually looks better. #3 curet necrotic surface material removed healthy granulation after this oRight medial lower leg is actually just about closed oLeft medial lower leg slightly larger necrotic surface also debridement with a #3 curet othe small abscess on the left lateral leg is a small open area with surrounding cellulitis here is better. In spite of the MRSA culture I do not feel strongly about additional antibiotics even though this was resistant to doxycycline the whole thing looks better Electronic Signature(s) Signed: 06/01/2017 4:47:51 PM By: Baltazar Najjar MD Entered By: Baltazar Najjar on 06/01/2017 16:38:54 Colan, [name] ([id number]) -------------------------------------------------------------------------------- Physician Orders Details [name] Date of Service: 06/01/2017 3:15 PM Medical Record Patient Account Number: [id number] Number: Treating RN: [name] 02-25-1931 (83 y.o. Other Clinician: Date of Birth/Sex: Female) Treating Cordarrius Coad Primary Care Provider: Guy Begin Provider/Extender: G Referring Provider: [name] in Treatment: 16 Verbal / Phone Orders: Yes Clinician: Ashok Cordia, Debi Read Back and Verified: Yes Diagnosis Coding Wound Cleansing Wound #2 Right,Lateral Malleolus o Clean wound with Normal Saline. o Cleanse wound with mild soap and water Wound #3 Left,Medial Lower Leg o Clean wound with Normal Saline. o Cleanse wound with mild soap and water Wound #4 Right,Medial Lower Leg o Clean wound with Normal Saline. o Cleanse wound with mild soap and water Wound #6 Left,Lateral Lower Leg o Clean wound with Normal Saline. o Cleanse wound with mild soap and water Anesthetic Wound #2 Right,Lateral Malleolus o Topical Lidocaine 4% cream  applied to wound bed prior to debridement - clinic use only Wound #3 Left,Medial Lower Leg o Topical Lidocaine 4% cream applied to wound bed prior to debridement - clinic use only Wound #4 Right,Medial Lower Leg o Topical Lidocaine 4% cream applied to wound bed prior to debridement - clinic use only Wound #6 Left,Lateral Lower Leg o Topical Lidocaine 4% cream applied to wound bed prior to debridement - clinic use only Skin Barriers/Peri-Wound Care Wound #2 Right,Lateral Malleolus o Barrier cream Wound #3 Left,Medial Lower Leg Staup, Jlyn J. ([id number]) o Barrier cream Wound #4 Right,Medial Lower Leg o Barrier cream Wound #6 Left,Lateral Lower Leg o Barrier cream Primary Wound Dressing Wound #2 Right,Lateral Malleolus o Aquacel Ag Wound #3 Left,Medial Lower Leg o Aquacel Ag Wound #4 Right,Medial Lower Leg o Aquacel Ag Wound #6 Left,Lateral Lower Leg o Aquacel Ag Secondary Dressing Wound #2 Right,Lateral Malleolus o Dry Gauze o Foam Wound #3 Left,Medial Lower Leg o Dry Gauze o Foam Wound #4 Right,Medial Lower Leg o Dry Gauze o Foam Wound #6 Left,Lateral Lower Leg o Dry Gauze o Foam Dressing Change Frequency Wound #2 Right,Lateral Malleolus o Change dressing every week Wound #3 Left,Medial Lower Leg o Change dressing every week Wound #4 Right,Medial Lower Leg o Change dressing every week Ezelle, Surprenant Bennie J. ([id number]) Wound #6 Left,Lateral Lower Leg o Change dressing every week Follow-up Appointments Wound #2 Right,Lateral Malleolus o Return Appointment in 1 week. Wound #3 Left,Medial Lower Leg o Return Appointment in 1 week. Wound #4 Right,Medial Lower Leg o Return Appointment in 1 week. Wound #6 Left,Lateral Lower Leg o Return Appointment in 1 week. Edema Control Wound #2 Right,Lateral Malleolus o Kerlix and Coban - Bilateral - wrap lightly o Elevate legs to the level of the heart  and pump ankles as  often as possible Wound #3 Left,Medial Lower Leg o Kerlix and Coban - Bilateral - wrap lightly o Elevate legs to the level of the heart and pump ankles as often as possible Wound #4 Right,Medial Lower Leg o Kerlix and Coban - Bilateral - wrap lightly o Elevate legs to the level of the heart and pump ankles as often as possible Wound #6 Left,Lateral Lower Leg o Kerlix and Coban - Bilateral - wrap lightly o Elevate legs to the level of the heart and pump ankles as often as possible Additional Orders / Instructions Wound #2 Right,Lateral Malleolus o Increase protein intake. Wound #3 Left,Medial Lower Leg o Increase protein intake. Wound #4 Right,Medial Lower Leg o Increase protein intake. Wound #6 Left,Lateral Lower Leg o Increase protein intake. TOMMYE, LEHENBAUER ([id number]) Electronic Signature(s) Signed: 06/01/2017 5:11:11 PM By: [name] Signed: 06/02/2017 8:29:14 AM By: Baltazar Najjar MD Previous Signature: 06/01/2017 4:47:51 PM Version By: Baltazar Najjar MD Entered By: [name] on 06/01/2017 16:50:15 Amenta, [name] ([id number]) -------------------------------------------------------------------------------- Problem List Details Patient Name: SUMMER, MCCOLGAN. Date of Service: 06/01/2017 3:15 PM Medical Record Patient Account Number: [id number] Number: Treating RN: [name] 06-Jun-1931 (83 y.o. 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 L97.221 Non-pressure chronic ulcer of left calf limited to 02/09/2017  Yes breakdown of skin I70.233 Atherosclerosis of native arteries of right leg with 02/25/2017 Yes ulceration of ankle I70.243 Atherosclerosis of native arteries of left leg with ulceration 02/25/2017 Yes of ankle Inactive Problems Resolved Problems Electronic Signature(s) Signed: 06/01/2017 4:47:51 PM By: Baltazar Najjar MD Entered By: Baltazar Najjar on 06/01/2017 16:32:34 Gravley, [name] ([id number]) -------------------------------------------------------------------------------- Progress Note Details [name] Date of Service: 06/01/2017 3:15 PM Medical Record Patient Account Number: [id number] Number: Treating RN: [name] 1931-11-19 (83 y.o. Other Clinician: Date of Birth/Sex: Female) Treating Joua Bake Primary Care Provider: Guy Begin Provider/Extender: G Referring Provider: [name] in Treatment: 16 Subjective Chief Complaint Information obtained from Patient the patient is here for follow-up evaluation of her bilateral lower extremity ulcers History of Present Illness (HPI) 06/10/16; this is an 83 year old woman who lives near [place] with family members. She saw her primary doctor roughly over a week ago and was referred to the ER for a cellulitis in the right medial leg. She was given a round of IV antibiotics in the emergency room and discharged on oral Keflex which she is taking. She is been left with a uncomfortable leg area on the right medial lower leg. Her ABIs in this clinic were 0.8 on the right 0.6 on the left. She does not describe claudication. She is not a diabetic. Lab work in the ER showed a normal BUN and creatinine on 6/20 at 18 and 0.92 respectively white count was 10.1 hemoglobin at 13.8. She did not have any imaging studies. I can see no vascular evaluations in Epic. She does not have a prior wound history 06/17/16; the area affected on her right medial ankle is an area I thought was due to tissue  damage from cellulitis last week. She is going for venous reflux studies this afternoon, these were not ordered in  this clinic at least not by me. The patient does not give a prior history of damage to the skin in this area although I wonder if she actually might not of noticed it. We have been using's Aquacel Ag under a wrap although we will not be able to wrap her today 06/24/16: pt reports today she hasn't heard from the vascular clinic regarding proceeding with arterial studies. our office will assist with this today. she denies s/s of infection. denies problems with her wraps. 07/07/16; since I last saw this woman she was admitted to hospital from 7/16 through 7/18 with sepsis syndrome felt to be secondary to her ulcer. She was treated with Vanco and Zosyn the hospital discharged on Septra. Blood cultures were negative. Urine culture showed multiple species she is now feeling well using Santyl with border foam to her wound. [facility] has [facility] 07/14/16; no major improvement here. She has a quarter-sized wound covered with thick adherent slough on the lateral aspect of her right leg with several surrounding satellite lesions in a similar state. She has had 2 recent bouts of cellulitis, one before she came to this clinic and one required a recent hospitalization. I have asked for arterial studies I don't believe these have ever been done. My notes suggest from early in July that she went for reflux studies although I don't know that I have ever seen these either, she did have a DVT rule out but I did not order this, this did not show a DVT but did show a Baker's cyst 07/21/2016 -- the patient had an arterial duplex study done with waveform suggestive of right femoropopliteal disease. There is also small vessel disease bilaterally. Right ABI was in the normal range of 1.0 and a TBI Dykman, Freeda J. ([id number]) was 0.74. Left ABI is abnormal at 0.69 with a TBI of 0.40. Three-vessel runoff on  the right and a two-vessel runoff on the left. She has an appointment to see Dr. Kirke CorinArida later today. 07/28/2016 -- was seen by Dr. Kirke CorinArida who reviewed her arterial studies with an ABI of 0.94 on the right and 0.69 on the left with normal right toe brachial index. Duplex showed diffuse atherosclerosis without discrete stenosis and there was a three-vessel runoff on the right below the knee. Based on these studies and the location of the ulcer he thought it was venous commended she continue with wound care consults. 08/04/16; I note the patient's reasonably functional arterial supply which is fortunate. The wound bed appears to be smaller. Debrided of surface slough. She is been using Iodoflex 08/11/16: Only a very small open area remains. However it has some depth 08/18/16; small open area is smaller. Still open however. 08/25/16; the area has totally closed down and epithelialized. READMISSION 02/09/17; this is an 83 year old woman who is not a diabetic or smoker. She was in our clinic in the summer into mid-September with a wound on her right medial calf that. This eventually closed down. I thought this was mostly venous. She did have evidence of PAD and she was seen by Dr. Kirke CorinArida. At that point she had an ABI of 0.94 on the right 0.69 on the left with a normal right toe brachial index. It was felt that she had enough blood flow to close her wound and that happened. She was discharged with stockings although I don't get the sense that she really use them she tells me that roughly a week or 2 she developed swelling in her bilateral  lower legs and then noted wounds on her right lateral malleolus and left medial calf. The on this she is not really sure how this happened. She is not having a lot of pain although she is not ambulatory that much only in her home. Other than weight loss she has not noticed any other issues no specific claudication no chest pain. 02/16/17; the patient continues with 2 wounds on  the right lateral malleolus and left medial calf. Both of these are painful. She has chronic venous insufficiency but also known PAD and is previously seen Dr. Kirke Corin. We have arranged vascular follow-up with Dr. Kirke Corin to look at the arterial situation 02/25/2017 -- patient was brought in today because she missed her appointment last week with Dr. Leanord Hawking and is having a lot of pain and drainage from her wounds. The interim she was seen by Dr. Bascom Levels who did a duplex of the right lower extremity which shows a right ABI of 0.95 and left ABI 0.74. There was also a abdominal aortogram with a bilateral extremity angiography done for significant peripheral vascular disease. The right lower extremity showed diffuse SFA and popliteal disease with significant stenosis affecting the TP trunk just before the origin of the peritoneal artery. The left lower extremity showed moderate left SFA stenosis with occluded popliteal artery and tibioperoneal vessels with only visible collateral. The opinion was the patient had no endovascular surgical revascularization options of the left lower extremity and Dr. Bascom Levels would discuss it with Dr. Randie Heinz for a second opinion. Endovascular intervention on the right TP trunk could be considered. 03/03/17; the patient arrives back today having last seen Dr. Meyer Russel. As noted she has severe PAD which is not amenable to either bypass surgery or endovascular interventions on the left. Endovascular interventions in the right TP trunk could be considered. She has large wounds on the right medial leg and a sizable wound on the right lateral leg. Small wound on the left medial leg. She is really in a lot of discomfort. Using Santyl on the right and I believe Prisma on the left. 03/10/17; the patient arrives today again with large wounds on the right lateral lower extremity, right medial lower extremity. And a small punched out wound on the left medial lower extremity. These are in the  setting of severe non-revascularizable PAD. She continues to complain of discomfort although I don't think right now this is unmanageable. We have been using Santyl. We are arranging getting her home health [facility] CHANDRIA, ROOKSTOOL ([id number]) 03/16/17- patient is here for follow-up evaluation of her bilateral lower extremity ulcers. She has an appointment on 4/11 for an arthrectomy per Dr. Kirke Corin at [facility]. She is continuing to [place]. Her son is doing her dressing changes 03/30/17 still using santyl. sincer her last visit she was admitted from 4/11-4/12 for arthrectomy and balloon angioplasty of the right distal popliteal artery with TP trunk and peritoneal arteries. she is on dual antiplatelet and states her pain is somewhat better on the right 04/13/17 still using santyl to all wounds. Area on the right medial and left lateral look better. Still difficulties with the right lateral wouind. Combination of arterial insufficiency and Venous stasis No revascularization options on the left. Possible interventions on the right (see above) 04/20/17; the patient is still using Santyl to all wound areas. We've had some improvement in the right medial and left lateral. Patient has a combination of arterial and venous insufficiency. No revascularization options are available to the patient on  the left however possible interventions on the right. 04/27/17; I continued using Santyl to all wound areas. We've had improvement in the right medial wound and the left lateral wound. The right lateral lower leg wound has remained static. She arrives today with a lot more edema in her bilateral legs this is pitting. She does not complain of chest pain or shortness of breath tells me she has no prior cardiac issues. 05/12/17; I been using Santyl to all of this lady's wounds. She has an appointment with Dr. Kirke Corin next week. The other issue this week is that apparently her insurance and [facility] are  no longer under contract though there was some suggestion that there is another home health company, we can't really determine who that was today. We change the primary dressing to all the wounds to [facility] 05/19/17; patient's appointment with Dr. Kirke Corin is actually next week. We have been using Hydrofera Blue to her wounds 05/26/17 as it turns out Arida's appointment was canceled by his office. It is now in early July. Patient arrives today with a large amount of green drainage coming out of the right lateral leg, she was also discovered by our intake nurse that have an abscess on the left lateral lower leg. We did not previously define a wound in this area. 06/01/17; culture of the abscess on the left lateral leg we did last week showed a rib fairly resistant MRSA. I brought the patient in early today because of this. I had put her on Doxy and Cipro last week for this and also green drainage coming out of the right lateral wound. Fortunately all the wounds today looks somewhat better. We have been using Aquacel Ag Objective Constitutional Sitting or standing Blood Pressure is within target range for patient.. Pulse regular and within target range for patient.Marland Kitchen Respirations regular, non-labored and within target range.. Temperature is normal and within the target range for the patient.Marland Kitchen appears in no distress. Vitals Time Taken: 3:24 PM, Height: 66 in, Weight: 159 lbs, BMI: 25.7, Temperature: 98.2 F, Pulse: 69 bpm, Respiratory Rate: 16 breaths/min, Blood Pressure: 148/61 mmHg. Eyes Conjunctivae clear. No discharge. MYKENZIE, EBANKS ([id number]) Respiratory Respiratory effort is easy and symmetric bilaterally. Rate is normal at rest and on room air.. Cardiovascular Pedal pulses absent bilaterally.. Lymphatic None palpable in the popliteal and inguinal area. General Notes: Wound exam; the area on her right lateral lower leg actually looks better. #3 curet necrotic surface material  removed healthy granulation after this Right medial lower leg is actually just about closed Left medial lower leg slightly larger necrotic surface also debridement with a #3 curet the small abscess on the left lateral leg is a small open area with surrounding cellulitis here is better. In spite of the MRSA culture I do not feel strongly about additional antibiotics even though this was resistant to doxycycline the whole thing looks better Integumentary (Hair, Skin) Wound #2 status is Open. Original cause of wound was Gradually Appeared. The wound is located on the Right,Lateral Malleolus. The wound measures 2.5cm length x 2cm width x 0.1cm depth; 3.927cm^2 area and 0.393cm^3 volume. There is Fat Layer (Subcutaneous Tissue) Exposed exposed. There is no tunneling or undermining noted. There is a large amount of serous drainage noted. The wound margin is distinct with the outline attached to the wound base. There is medium (34-66%) red granulation within the wound bed. There is a medium (34-66%) amount of necrotic tissue within the wound bed including Adherent Slough. The  periwound skin appearance exhibited: Scarring, Maceration. Periwound temperature was noted as No Abnormality. The periwound has tenderness on palpation. Wound #3 status is Open. Original cause of wound was Gradually Appeared. The wound is located on the Left,Medial Lower Leg. The wound measures 0.5cm length x 0.4cm width x 0.1cm depth; 0.157cm^2 area and 0.016cm^3 volume. There is Fat Layer (Subcutaneous Tissue) Exposed exposed. There is no tunneling or undermining noted. There is a large amount of serosanguineous drainage noted. The wound margin is distinct with the outline attached to the wound base. There is large (67-100%) red granulation within the wound bed. There is no necrotic tissue within the wound bed. The periwound skin appearance exhibited: Scarring, Maceration. Periwound temperature was noted as No Abnormality. The  periwound has tenderness on palpation. Wound #4 status is Open. Original cause of wound was Gradually Appeared. The wound is located on the Right,Medial Lower Leg. The wound measures 2cm length x 1.5cm width x 0.1cm depth; 2.356cm^2 area and 0.236cm^3 volume. There is Fat Layer (Subcutaneous Tissue) Exposed exposed. There is no tunneling or undermining noted. There is a large amount of serosanguineous drainage noted. The wound margin is distinct with the outline attached to the wound base. There is small (1-33%) red granulation within the wound bed. There is a large (67-100%) amount of necrotic tissue within the wound bed including Eschar and Adherent Slough. The periwound skin appearance exhibited: Scarring, Maceration. Periwound temperature was noted as No Abnormality. The periwound has tenderness on palpation. Wound #6 status is Open. Original cause of wound was Bump. The wound is located on the Left,Lateral Lower Leg. The wound measures 0.5cm length x 0.5cm width x 0.2cm depth; 0.196cm^2 area and 0.039cm^3 volume. There is no tunneling or undermining noted. There is a large amount of serous drainage noted. The wound margin is indistinct and nonvisible. There is no granulation within the wound bed. There is a large (67-100%) amount of necrotic tissue within the wound bed including Adherent Slough. The MARCOS, PELOSO ([id number]) periwound skin appearance exhibited: Induration, Erythema. The periwound skin appearance did not exhibit: Ecchymosis. The surrounding wound skin color is noted with erythema which is circumferential. 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 There was a Skin/Subcutaneous Tissue Debridement ([id number]) debridement with total area of 5 sq cm performed by Maxwell Caul, MD. with the following instrument(s): Curette to remove Viable and Non-Viable tissue/material including Exudate, Fibrin/Slough, and Subcutaneous after achieving pain control using Lidocaine 4% Topical Solution. A time out was conducted at 15:59, prior to the start of the procedure. A Minimum amount of bleeding was controlled with Pressure. The procedure was tolerated well with a pain level of 0 throughout and a pain level of 0 following the procedure. Post Debridement Measurements: 2.5cm length x 2cm width x 0.1cm depth; 0.393cm^3 volume. Character of Wound/Ulcer Post Debridement requires further debridement. Severity of Tissue Post Debridement is: Fat layer exposed. Post procedure Diagnosis Wound #2: Same as Pre-Procedure Wound #3 Pre-procedure diagnosis of Wound #  3 is a Venous Leg Ulcer located on the Left,Medial Lower Leg .Severity of Tissue Pre Debridement is: Fat layer exposed. There was a Skin/Subcutaneous Tissue Debridement ([id number]) debridement with total area of 0.2 sq cm performed by Maxwell Caul, MD. with the following instrument(s): Curette to remove Viable and Non-Viable tissue/material including Exudate, Fibrin/Slough, and Subcutaneous after achieving pain control using Lidocaine 4% Topical Solution. A time out was conducted at 15:59, prior to the start of the procedure. A Minimum amount of bleeding was controlled with Pressure. The procedure was tolerated well with a pain level of 0 throughout and a pain level of 0 following the procedure. Post Debridement Measurements: 0.5cm  length x 0.4cm width x 0.1cm depth; 0.016cm^3 volume. Character of Wound/Ulcer Post Debridement requires further debridement. Severity of Tissue Post AYBREE, LANYON. ([id number]) Debridement is: Fat layer exposed. Post procedure Diagnosis Wound #3: Same as Pre-Procedure Plan Wound Cleansing: Wound #2 Right,Lateral Malleolus: Clean wound with Normal Saline. Cleanse wound with mild soap and water Wound #3 Left,Medial Lower Leg: Clean wound with Normal Saline. Cleanse wound with mild soap and water Wound #4 Right,Medial Lower Leg: Clean wound with Normal Saline. Cleanse wound with mild soap and water Wound #6 Left,Lateral Lower Leg: Clean wound with Normal Saline. Cleanse wound with mild soap and water Anesthetic: Wound #2 Right,Lateral Malleolus: Topical Lidocaine 4% cream applied to wound bed prior to debridement - clinic use only Wound #3 Left,Medial Lower Leg: Topical Lidocaine 4% cream applied to wound bed prior to debridement - clinic use only Wound #4 Right,Medial Lower Leg: Topical Lidocaine 4% cream applied to wound bed prior to debridement - clinic use only Wound #6 Left,Lateral Lower Leg: Topical Lidocaine 4% cream applied to wound bed prior to debridement - clinic use only Skin Barriers/Peri-Wound Care: Wound #2 Right,Lateral Malleolus: Barrier cream Wound #3 Left,Medial Lower Leg: Barrier cream Wound #4 Right,Medial Lower Leg: Barrier cream Wound #6 Left,Lateral Lower Leg: Barrier cream Primary Wound Dressing: Wound #2 Right,Lateral Malleolus: Aquacel Ag Wound #3 Left,Medial Lower Leg: Aquacel Ag Wound #4 Right,Medial Lower Leg: Aquacel Ag Wound #6 Left,Lateral Lower Leg: Aquacel Ag Jinx, Gilden Merikay J. ([id number]) Secondary Dressing: Wound #2 Right,Lateral Malleolus: Dry Gauze Foam Wound #3 Left,Medial Lower Leg: Dry Gauze Foam Wound #4 Right,Medial Lower Leg: Dry Gauze Foam Wound #6 Left,Lateral Lower Leg: Dry Gauze Foam Dressing Change  Frequency: Wound #2 Right,Lateral Malleolus: Change dressing every week Wound #3 Left,Medial Lower Leg: Change dressing every week Wound #4 Right,Medial Lower Leg: Change dressing every week Wound #6 Left,Lateral Lower Leg: Change dressing every week Follow-up Appointments: Wound #2 Right,Lateral Malleolus: Return Appointment in 1 week. Wound #3 Left,Medial Lower Leg: Return Appointment in 1 week. Wound #4 Right,Medial Lower Leg: Return Appointment in 1 week. Wound #6 Left,Lateral Lower Leg: Return Appointment in 1 week. Edema Control: Wound #2 Right,Lateral Malleolus: Kerlix and Coban - Bilateral - wrap lightly Elevate legs to the level of the heart and pump ankles as often as possible Wound #3 Left,Medial Lower Leg: Kerlix and Coban - Bilateral - wrap lightly Elevate legs to the level of the heart and pump ankles as often as possible Wound #4 Right,Medial Lower Leg: Kerlix and Coban - Bilateral - wrap lightly Elevate legs to the level of the heart and pump ankles as often as possible Wound #6 Left,Lateral Lower Leg: Kerlix and Coban - Bilateral - wrap lightly Elevate legs to the level of the heart and pump ankles as often as possible Additional Orders / Instructions: Wound #2  Right,Lateral Malleolus: Increase protein intake. Wound #3 Left,Medial Lower Leg: Increase protein intake. Wound #4 Right,Medial Lower Leg: Increase protein intake. ELIKA, GODAR ([id number]) Wound #6 Left,Lateral Lower Leg: Increase protein intake. #1 no additional antibiotics are necessary #2 continue Aquacel Ag Electronic Signature(s) Signed: 06/02/2017 8:21:09 AM By: Elliot Gurney, BSN, RN, CWS, Kim RN, BSN Signed: 06/02/2017 8:29:14 AM By: Baltazar Najjar MD Previous Signature: 06/01/2017 4:47:51 PM Version By: Baltazar Najjar MD Entered By: Elliot Gurney, BSN, RN, CWS, Kim on 06/02/2017 08:21:09 BEVERELY, SUEN  ([id number]) -------------------------------------------------------------------------------- SuperBill Details Patient Name: TRICHELLE, LEHAN. Date of Service: 06/01/2017 Medical Record Patient Account Number: [id number] Number: Treating RN: [name] 01/24/1931 (83 y.o. 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 L97.221 Non-pressure chronic ulcer of left calf limited to breakdown of skin I70.233 Atherosclerosis of native arteries of right leg with ulceration of ankle I70.243 Atherosclerosis of native arteries of left leg with ulceration of ankle Facility Procedures CPT4 Code Description: [id number] 11042 - DEB SUBQ TISSUE 20 SQ CM/< ICD-10 Description Diagnosis L97.313 Non-pressure chronic ulcer of right ankle with necrosi L97.221 Non-pressure chronic ulcer of left calf limited to bre Modifier: s of muscle akdown of skin Quantity: 1 Physician Procedures CPT4 Code Description: [id number] 11042 - WC PHYS SUBQ TISS 20 SQ CM ICD-10 Description Diagnosis L97.313 Non-pressure chronic ulcer of right ankle with necrosi L97.221 Non-pressure chronic ulcer of left calf limited to bre Modifier: s of muscle akdown of skin Quantity: 1 Electronic Signature(s) Signed: 06/01/2017 4:47:51 PM By: Baltazar Najjar MD Entered By: Baltazar Najjar on 06/01/2017 16:40:12

## 2017-06-09 ENCOUNTER — Encounter: Payer: Medicare Other | Admitting: Internal Medicine

## 2017-06-09 DIAGNOSIS — I87333 Chronic venous hypertension (idiopathic) with ulcer and inflammation of bilateral lower extremity: Secondary | ICD-10-CM | POA: Diagnosis not present

## 2017-06-10 NOTE — Progress Notes (Signed)
HEND, MCCARRELL ([id number]) Visit Report for 06/09/2017 HPI Details [name], [name]. Date of Service: 06/09/2017 3:30 PM Medical Record Patient Account Number: [id number] Number: Treating RN: [name] 15-May-1931 (83 y.o. Other Clinician: Date of Birth/Sex: Female) Treating Arnetha Silverthorne Primary Care Provider: Guy Begin Provider/Extender: G Referring Provider: Guy Begin Weeks in Treatment: 17 History of Present Illness HPI Description: 06/10/16; this is an 83 year old woman who lives near [place] with family members. She saw her primary doctor roughly over a week ago and was referred to the ER for a cellulitis in the right medial leg. She was given a round of IV antibiotics in the emergency room and discharged on oral Keflex which she is taking. She is been left with a uncomfortable leg area on the right medial lower leg. Her ABIs in this clinic were 0.8 on the right 0.6 on the left. She does not describe claudication. She is not a diabetic. Lab work in the ER showed a normal BUN and creatinine on 6/20 at 18 and 0.92 respectively white count was 10.1 hemoglobin at 13.8. She did not have any imaging studies. I can see no vascular evaluations in Epic. She does not have a prior wound history 06/17/16; the area affected on her right medial ankle is an area I thought was due to tissue damage from cellulitis last week. She is going for venous reflux studies this afternoon, these were not ordered in this clinic at least not by me. The patient does not give a prior history of damage to the skin in this area although I wonder if she actually might not of noticed it. We have been using's Aquacel Ag under a wrap although we will not be able to wrap her today 06/24/16: pt reports today she hasn't heard from the vascular clinic regarding proceeding with arterial studies. our office will assist with this today. she denies s/s of infection. denies problems with her  wraps. 07/07/16; since I last saw this woman she was admitted to hospital from 7/16 through 7/18 with sepsis syndrome felt to be secondary to her ulcer. She was treated with Vanco and Zosyn the hospital discharged on Septra. Blood cultures were negative. Urine culture showed multiple species she is now feeling well using Santyl with border foam to her wound. She has [facility] 07/14/16; no major improvement here. She has a quarter-sized wound covered with thick adherent slough on the lateral aspect of her right leg with several surrounding satellite lesions in a similar state. She has had 2 recent bouts of cellulitis, one before she came to this clinic and one required a recent hospitalization. I have asked for arterial studies I don't believe these have ever been done. My notes suggest from early in July that she went for reflux studies although I don't know that I have ever seen these either, she did have a DVT rule out but I did not order this, this did not show a DVT but did show a Baker's cyst 07/21/2016 -- the patient had an arterial duplex study done with waveform suggestive of right femoropopliteal disease. There is also small vessel disease bilaterally. Right ABI was in the normal range of 1.0 and a TBI was 0.74. Left ABI is abnormal at 0.69 with a TBI of 0.40. Three-vessel runoff on the right and a two-vessel runoff on the left. She has an appointment to see Dr. Kirke Corin later today. [name], [name] ([id number]) 07/28/2016 -- was seen by Dr. Kirke Corin who reviewed  her arterial studies with an ABI of 0.94 on the right and 0.69 on the left with normal right toe brachial index. Duplex showed diffuse atherosclerosis without discrete stenosis and there was a three-vessel runoff on the right below the knee. Based on these studies and the location of the ulcer he thought it was venous commended she continue with wound care consults. 08/04/16; I note the patient's reasonably functional arterial  supply which is fortunate. The wound bed appears to be smaller. Debrided of surface slough. She is been using Iodoflex 08/11/16: Only a very small open area remains. However it has some depth 08/18/16; small open area is smaller. Still open however. 08/25/16; the area has totally closed down and epithelialized. READMISSION 02/09/17; this is an 83 year old woman who is not a diabetic or smoker. She was in our clinic in the summer into mid-September with a wound on her right medial calf that. This eventually closed down. I thought this was mostly venous. She did have evidence of PAD and she was seen by Dr. Kirke Corin. At that point she had an ABI of 0.94 on the right 0.69 on the left with a normal right toe brachial index. It was felt that she had enough blood flow to close her wound and that happened. She was discharged with stockings although I don't get the sense that she really use them she tells me that roughly a week or 2 she developed swelling in her bilateral lower legs and then noted wounds on her right lateral malleolus and left medial calf. The on this she is not really sure how this happened. She is not having a lot of pain although she is not ambulatory that much only in her home. Other than weight loss she has not noticed any other issues no specific claudication no chest pain. 02/16/17; the patient continues with 2 wounds on the right lateral malleolus and left medial calf. Both of these are painful. She has chronic venous insufficiency but also known PAD and is previously seen Dr. Kirke Corin. We have arranged vascular follow-up with Dr. Kirke Corin to look at the arterial situation 02/25/2017 -- patient was brought in today because she missed her appointment last week with Dr. Leanord Hawking and is having a lot of pain and drainage from her wounds. The interim she was seen by Dr. Bascom Levels who did a duplex of the right lower extremity which shows a right ABI of 0.95 and left ABI 0.74. There was also a abdominal  aortogram with a bilateral extremity angiography done for significant peripheral vascular disease. The right lower extremity showed diffuse SFA and popliteal disease with significant stenosis affecting the TP trunk just before the origin of the peritoneal artery. The left lower extremity showed moderate left SFA stenosis with occluded popliteal artery and tibioperoneal vessels with only visible collateral. The opinion was the patient had no endovascular surgical revascularization options of the left lower extremity and Dr. Bascom Levels would discuss it with Dr. Randie Heinz for a second opinion. Endovascular intervention on the right TP trunk could be considered. 03/03/17; the patient arrives back today having last seen Dr. Meyer Russel. As noted she has severe PAD which is not amenable to either bypass surgery or endovascular interventions on the left. Endovascular interventions in the right TP trunk could be considered. She has large wounds on the right medial leg and a sizable wound on the right lateral leg. Small wound on the left medial leg. She is really in a lot of discomfort. Using Santyl on the right and  I believe Prisma on the left. 03/10/17; the patient arrives today again with large wounds on the right lateral lower extremity, right medial lower extremity. And a small punched out wound on the left medial lower extremity. These are in the setting of severe non-revascularizable PAD. She continues to complain of discomfort although I don't think right now this is unmanageable. We have been using Santyl. We are arranging getting her home health [[facility]] 03/16/17- patient is here for follow-up evaluation of her bilateral lower extremity ulcers. She has an appointment on 4/11 for an arthrectomy per Dr. Kirke Corin at [facility] She is continuing to [facility]. Her son DEVINN, VOSHELL ([id number]) is doing her dressing changes 03/30/17 still using santyl. sincer her last visit she was admitted from 4/11-4/12 for  arthrectomy and balloon angioplasty of the right distal popliteal artery with TP trunk and peritoneal arteries. she is on dual antiplatelet and states her pain is somewhat better on the right 04/13/17 still using santyl to all wounds. Area on the right medial and left lateral look better. Still difficulties with the right lateral wouind. Combination of arterial insufficiency and Venous stasis No revascularization options on the left. Possible interventions on the right (see above) 04/20/17; the patient is still using Santyl to all wound areas. We've had some improvement in the right medial and left lateral. Patient has a combination of arterial and venous insufficiency. No revascularization options are available to the patient on the left however possible interventions on the right. 04/27/17; I continued using Santyl to all wound areas. We've had improvement in the right medial wound and the left lateral wound. The right lateral lower leg wound has remained static. She arrives today with a lot more edema in her bilateral legs this is pitting. She does not complain of chest pain or shortness of breath tells me she has no prior cardiac issues. 05/12/17; I been using Santyl to all of this lady's wounds. She has an appointment with Dr. Kirke Corin next week. The other issue this week is that apparently her insurance and [facility] are no longer under contract though there was some suggestion that there is another home health company, we can't really determine who that was today. We change the primary dressing to all the wounds to [facility] 05/19/17; patient's appointment with Dr. Kirke Corin is actually next week. We have been using Hydrofera Blue to her wounds 05/26/17 as it turns out Arida's appointment was canceled by his office. It is now in early July. Patient arrives today with a large amount of green drainage coming out of the right lateral leg, she was also discovered by our intake nurse that have an  abscess on the left lateral lower leg. We did not previously define a wound in this area. 06/01/17; culture of the abscess on the left lateral leg we did last week showed a rib fairly resistant MRSA. I brought the patient in early today because of this. I had put her on Doxy and Cipro last week for this and also green drainage coming out of the right lateral wound. Fortunately all the wounds today looks somewhat better. We have been using Aquacel Ag 06/09/17; she is completed antibiotics. We are using Aquacel Ag to all wound areas. She has had closure on the right medial leg and improvement especially on the right lateral leg. Electronic Signature(s) Signed: 06/09/2017 5:06:34 PM By: Baltazar Najjar MD Entered By: Baltazar Najjar on 06/09/2017 16:25:17 [name], [name] ([id number]) -------------------------------------------------------------------------------- Physical Exam Details Patient Name:  Eckersley, Trenise J. Date of Service: 06/09/2017 3:30 PM Medical Record Patient Account Number: [id number] Number: Treating RN: [name] [birth year] (83 y.o. Other Clinician: Date of Birth/Sex: Female) Treating Lynnmarie Lovett Primary Care Provider: Guy Begin Provider/Extender: G Referring Provider: Guy Begin Weeks in Treatment: 17 Constitutional Sitting or standing Blood Pressure is within target range for patient.. Pulse regular and within target range for patient.Marland Kitchen Respirations regular, non-labored and within target range.. Temperature is normal and within the target range for the patient.Marland Kitchen appears in no distress. Eyes Conjunctivae clear. No discharge. Respiratory Respiratory effort is easy and symmetric bilaterally. Rate is normal at rest and on room air.. Cardiovascular Dorsalis pedis pulses faintly palpable bilaterally. Chronic venous insufficiency however edema is well controlled. Lymphatic None palpable in the popliteal or inguinal area. Psychiatric No evidence  of depression, anxiety, or agitation. Calm, cooperative, and communicative. Appropriate interactions and affect.. Notes Wound exam; oThe area on the right medial lower extremity is healed oArea on the right lateral leg looks a lot better oLeft lateral leg abscess still a small open area but looks stable to improved oArea on the left medial leg has a healthy surface. oNo debridement in any area was felt to be necessary Electronic Signature(s) Signed: 06/09/2017 5:06:34 PM By: Baltazar Najjar MD Entered By: Baltazar Najjar on 06/09/2017 16:27:15 Reason, [name] ([id number]) -------------------------------------------------------------------------------- Physician Orders Details [name] Date of Service: 06/09/2017 3:30 PM Medical Record Patient Account Number: [id number] Number: Treating RN: [name] 1931-06-01 (83 y.o. Other Clinician: Date of Birth/Sex: Female) Treating Barbee Mamula Primary Care Provider: Guy Begin Provider/Extender: G Referring Provider: [name] in Treatment: 64 Verbal / Phone Orders: Yes Clinician: Ashok Cordia, Debi Read Back and Verified: Yes Diagnosis Coding Wound Cleansing Wound #2 Right,Lateral Malleolus o Clean wound with Normal Saline. o Cleanse wound with mild soap and water Wound #3 Left,Medial Lower Leg o Clean wound with Normal Saline. o Cleanse wound with mild soap and water Wound #6 Left,Lateral Lower Leg o Clean wound with Normal Saline. o Cleanse wound with mild soap and water Anesthetic Wound #2 Right,Lateral Malleolus o Topical Lidocaine 4% cream applied to wound bed prior to debridement - clinic use only Wound #3 Left,Medial Lower Leg o Topical Lidocaine 4% cream applied to wound bed prior to debridement - clinic use only Wound #6 Left,Lateral Lower Leg o Topical Lidocaine 4% cream applied to wound bed prior to debridement - clinic use only Skin  Barriers/Peri-Wound Care Wound #2 Right,Lateral Malleolus o Barrier cream Wound #3 Left,Medial Lower Leg o Barrier cream Wound #6 Left,Lateral Lower Leg o Barrier cream Primary Wound Dressing Wound #2 Right,Lateral Malleolus Soller, Gabryela J. ([id number]) o Aquacel Ag Wound #3 Left,Medial Lower Leg o Aquacel Ag Wound #6 Left,Lateral Lower Leg o Aquacel Ag Secondary Dressing Wound #2 Right,Lateral Malleolus o Dry Gauze o Foam Wound #3 Left,Medial Lower Leg o Dry Gauze o Foam Wound #6 Left,Lateral Lower Leg o Dry Gauze o Foam Dressing Change Frequency Wound #2 Right,Lateral Malleolus o Change dressing every week Wound #3 Left,Medial Lower Leg o Change dressing every week Wound #6 Left,Lateral Lower Leg o Change dressing every week Follow-up Appointments Wound #2 Right,Lateral Malleolus o Return Appointment in 1 week. o Nurse Visit as needed Wound #3 Left,Medial Lower Leg o Return Appointment in 1 week. o Nurse Visit as needed Wound #6 Left,Lateral Lower Leg o Return Appointment in 1 week. o Nurse Visit as needed Edema Control Wound #2 Right,Lateral Malleolus o Kerlix and Coban -  Bilateral - wrap lightly [name], Jordan J. ([id number]) o Elevate legs to the level of the heart and pump ankles as often as possible Wound #3 Left,Medial Lower Leg o Kerlix and Coban - Bilateral - wrap lightly o Elevate legs to the level of the heart and pump ankles as often as possible Wound #6 Left,Lateral Lower Leg o Kerlix and Coban - Bilateral - wrap lightly o Elevate legs to the level of the heart and pump ankles as often as possible Additional Orders / Instructions Wound #2 Right,Lateral Malleolus o Increase protein intake. Wound #3 Left,Medial Lower Leg o Increase protein intake. Wound #6 Left,Lateral Lower Leg o Increase protein intake. Electronic Signature(s) Signed: 06/09/2017 4:41:02 PM By: [name] Signed:  06/09/2017 5:06:34 PM By: Baltazar Najjar MD Entered By: [name] on 06/09/2017 15:59:33 Clinkscale, [name] ([id number]) -------------------------------------------------------------------------------- Problem List Details Patient Name: TIMA, CURET. Date of Service: 06/09/2017 3:30 PM Medical Record Patient Account Number: [id number] Number: Treating RN: [name] [birth year] (83 y.o. 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 L97.221 Non-pressure chronic ulcer of left calf limited to 02/09/2017 Yes breakdown of skin I70.233 Atherosclerosis of native arteries of right leg with 02/25/2017 Yes ulceration of ankle I70.243 Atherosclerosis of native arteries of left leg with ulceration 02/25/2017 Yes of ankle Inactive Problems Resolved Problems Electronic Signature(s) Signed: 06/09/2017 5:06:34 PM By: Baltazar Najjar MD Entered By: Baltazar Najjar on 06/09/2017 16:24:14 Sheeran, [name] ([id number]) -------------------------------------------------------------------------------- Progress Note Details [name] Date of Service: 06/09/2017 3:30 PM Medical Record Patient Account Number: [id number] Number: Treating RN: [name] [birth year] (83 y.o. Other Clinician: Date of Birth/Sex: Female) Treating Quintez Maselli Primary Care Provider: Guy Begin Provider/Extender: G Referring Provider: Guy Begin Weeks in Treatment: 17 Subjective History of Present Illness (HPI) 06/10/16; this is an 83 year old  woman who lives near [place] with family members. She saw her primary doctor roughly over a week ago and was referred to the ER for a cellulitis in the right medial leg. She was given a round of IV antibiotics in the emergency room and discharged on oral Keflex which she is taking. She is been left with a uncomfortable leg area on the right medial lower leg. Her ABIs in this clinic were 0.8 on the right 0.6 on the left. She does not describe claudication. She is not a diabetic. Lab work in the ER showed a normal BUN and creatinine on 6/20 at 18 and 0.92 respectively white count was 10.1 hemoglobin at 13.8. She did not have any imaging studies. I can see no vascular evaluations in Epic. She does not have a prior wound history 06/17/16; the area affected on her right medial ankle is an area I thought was due to tissue damage from cellulitis last week. She is going for venous reflux studies this afternoon, these were not ordered in this clinic at least not by me. The patient does not give a prior history of damage to the skin in this area although I wonder if she actually might not of noticed it. We have been using's Aquacel Ag under a wrap although we will not be able to wrap her today 06/24/16: pt reports  today she hasn't heard from the vascular clinic regarding proceeding with arterial studies. our office will assist with this today. she denies s/s of infection. denies problems with her wraps. 07/07/16; since I last saw this woman she was admitted to hospital from 7/16 through 7/18 with sepsis syndrome felt to be secondary to her ulcer. She was treated with Vanco and Zosyn the hospital discharged on Septra. Blood cultures were negative. Urine culture showed multiple species she is now feeling well using Santyl with border foam to her wound. She has [facility] 07/14/16; no major improvement here. She has a quarter-sized wound covered with thick adherent slough on the lateral aspect of her right  leg with several surrounding satellite lesions in a similar state. She has had 2 recent bouts of cellulitis, one before she came to this clinic and one required a recent hospitalization. I have asked for arterial studies I don't believe these have ever been done. My notes suggest from early in July that she went for reflux studies although I don't know that I have ever seen these either, she did have a DVT rule out but I did not order this, this did not show a DVT but did show a Baker's cyst 07/21/2016 -- the patient had an arterial duplex study done with waveform suggestive of right femoropopliteal disease. There is also small vessel disease bilaterally. Right ABI was in the normal range of 1.0 and a TBI was 0.74. Left ABI is abnormal at 0.69 with a TBI of 0.40. Three-vessel runoff on the right and a two-vessel runoff on the left. She has an appointment to see Dr. Kirke CorinArida later today. 07/28/2016 -- was seen by Dr. Kirke CorinArida who reviewed her arterial studies with an ABI of 0.94 on the right and Hyer, Shameka J. ([id number]) 0.69 on the left with normal right toe brachial index. Duplex showed diffuse atherosclerosis without discrete stenosis and there was a three-vessel runoff on the right below the knee. Based on these studies and the location of the ulcer he thought it was venous commended she continue with wound care consults. 08/04/16; I note the patient's reasonably functional arterial supply which is fortunate. The wound bed appears to be smaller. Debrided of surface slough. She is been using Iodoflex 08/11/16: Only a very small open area remains. However it has some depth 08/18/16; small open area is smaller. Still open however. 08/25/16; the area has totally closed down and epithelialized. READMISSION 02/09/17; this is an 83 year old woman who is not a diabetic or smoker. She was in our clinic in the summer into mid-September with a wound on her right medial calf that. This eventually closed down. I  thought this was mostly venous. She did have evidence of PAD and she was seen by Dr. Kirke CorinArida. At that point she had an ABI of 0.94 on the right 0.69 on the left with a normal right toe brachial index. It was felt that she had enough blood flow to close her wound and that happened. She was discharged with stockings although I don't get the sense that she really use them she tells me that roughly a week or 2 she developed swelling in her bilateral lower legs and then noted wounds on her right lateral malleolus and left medial calf. The on this she is not really sure how this happened. She is not having a lot of pain although she is not ambulatory that much only in her home. Other than weight loss she has not noticed any other  issues no specific claudication no chest pain. 02/16/17; the patient continues with 2 wounds on the right lateral malleolus and left medial calf. Both of these are painful. She has chronic venous insufficiency but also known PAD and is previously seen Dr. Kirke Corin. We have arranged vascular follow-up with Dr. Kirke Corin to look at the arterial situation 02/25/2017 -- patient was brought in today because she missed her appointment last week with Dr. Leanord Hawking and is having a lot of pain and drainage from her wounds. The interim she was seen by Dr. Bascom Levels who did a duplex of the right lower extremity which shows a right ABI of 0.95 and left ABI 0.74. There was also a abdominal aortogram with a bilateral extremity angiography done for significant peripheral vascular disease. The right lower extremity showed diffuse SFA and popliteal disease with significant stenosis affecting the TP trunk just before the origin of the peritoneal artery. The left lower extremity showed moderate left SFA stenosis with occluded popliteal artery and tibioperoneal vessels with only visible collateral. The opinion was the patient had no endovascular surgical revascularization options of the left lower extremity and  Dr. Bascom Levels would discuss it with Dr. Randie Heinz for a second opinion. Endovascular intervention on the right TP trunk could be considered. 03/03/17; the patient arrives back today having last seen Dr. Meyer Russel. As noted she has severe PAD which is not amenable to either bypass surgery or endovascular interventions on the left. Endovascular interventions in the right TP trunk could be considered. She has large wounds on the right medial leg and a sizable wound on the right lateral leg. Small wound on the left medial leg. She is really in a lot of discomfort. Using Santyl on the right and I believe Prisma on the left. 03/10/17; the patient arrives today again with large wounds on the right lateral lower extremity, right medial lower extremity. And a small punched out wound on the left medial lower extremity. These are in the setting of severe non-revascularizable PAD. She continues to complain of discomfort although I don't think right now this is unmanageable. We have been using Santyl. We are arranging getting her home health [[facility]] 03/16/17- patient is here for follow-up evaluation of her bilateral lower extremity ulcers. She has an appointment on 4/11 for an arthrectomy per Dr. Kirke Corin at [facility]. She is continuing to [facility]. Her son is doing her dressing changes 03/30/17 still using santyl. sincer her last visit she was admitted from 4/11-4/12 for arthrectomy and balloon [name], [name]. ([id number]) angioplasty of the right distal popliteal artery with TP trunk and peritoneal arteries. she is on dual antiplatelet and states her pain is somewhat better on the right 04/13/17 still using santyl to all wounds. Area on the right medial and left lateral look better. Still difficulties with the right lateral wouind. Combination of arterial insufficiency and Venous stasis No revascularization options on the left. Possible interventions on the right (see above) 04/20/17; the patient is still using  Santyl to all wound areas. We've had some improvement in the right medial and left lateral. Patient has a combination of arterial and venous insufficiency. No revascularization options are available to the patient on the left however possible interventions on the right. 04/27/17; I continued using Santyl to all wound areas. We've had improvement in the right medial wound and the left lateral wound. The right lateral lower leg wound has remained static. She arrives today with a lot more edema in her bilateral legs this is pitting. She  does not complain of chest pain or shortness of breath tells me she has no prior cardiac issues. 05/12/17; I been using Santyl to all of this lady's wounds. She has an appointment with Dr. Kirke Corin next week. The other issue this week is that apparently her insurance and [facility] are no longer under contract though there was some suggestion that there is another home health company, we can't really determine who that was today. We change the primary dressing to all the wounds to [facility] 05/19/17; patient's appointment with Dr. Kirke Corin is actually next week. We have been using Hydrofera Blue to her wounds 05/26/17 as it turns out Arida's appointment was canceled by his office. It is now in early July. Patient arrives today with a large amount of green drainage coming out of the right lateral leg, she was also discovered by our intake nurse that have an abscess on the left lateral lower leg. We did not previously define a wound in this area. 06/01/17; culture of the abscess on the left lateral leg we did last week showed a rib fairly resistant MRSA. I brought the patient in early today because of this. I had put her on Doxy and Cipro last week for this and also green drainage coming out of the right lateral wound. Fortunately all the wounds today looks somewhat better. We have been using Aquacel Ag 06/09/17; she is completed antibiotics. We are using Aquacel Ag to  all wound areas. She has had closure on the right medial leg and improvement especially on the right lateral leg. Objective Constitutional Sitting or standing Blood Pressure is within target range for patient.. Pulse regular and within target range for patient.Marland Kitchen Respirations regular, non-labored and within target range.. Temperature is normal and within the target range for the patient.Marland Kitchen appears in no distress. Vitals Time Taken: 3:34 PM, Height: 66 in, Weight: 159 lbs, BMI: 25.7, Temperature: 98.9 F, Pulse: 71 bpm, Respiratory Rate: 16 breaths/min, Blood Pressure: 146/75 mmHg. Eyes Conjunctivae clear. No discharge. KRYSTEENA, STALKER ([id number]) Respiratory Respiratory effort is easy and symmetric bilaterally. Rate is normal at rest and on room air.. Cardiovascular Dorsalis pedis pulses faintly palpable bilaterally. Chronic venous insufficiency however edema is well controlled. Lymphatic None palpable in the popliteal or inguinal area. Psychiatric No evidence of depression, anxiety, or agitation. Calm, cooperative, and communicative. Appropriate interactions and affect.. General Notes: Wound exam; The area on the right medial lower extremity is healed Area on the right lateral leg looks a lot better Left lateral leg abscess still a small open area but looks stable to improved Area on the left medial leg has a healthy surface. No debridement in any area was felt to be necessary Integumentary (Hair, Skin) Wound #2 status is Open. Original cause of wound was Gradually Appeared. The wound is located on the Right,Lateral Malleolus. The wound measures 2cm length x 1cm width x 0.2cm depth; 1.571cm^2 area and 0.314cm^3 volume. There is Fat Layer (Subcutaneous Tissue) Exposed exposed. There is no tunneling or undermining noted. There is a large amount of serosanguineous drainage noted. The wound margin is distinct with the outline attached to the wound base. There is medium (34-66%) red  granulation within the wound bed. There is a medium (34-66%) amount of necrotic tissue within the wound bed including Adherent Slough. The periwound skin appearance exhibited: Scarring, Maceration. Periwound temperature was noted as No Abnormality. The periwound has tenderness on palpation. Wound #3 status is Open. Original cause of wound was Gradually Appeared. The wound  is located on the Left,Medial Lower Leg. The wound measures 1cm length x 1cm width x 0.2cm depth; 0.785cm^2 area and 0.157cm^3 volume. There is Fat Layer (Subcutaneous Tissue) Exposed exposed. There is no tunneling or undermining noted. There is a large amount of serosanguineous drainage noted. The wound margin is distinct with the outline attached to the wound base. There is medium (34-66%) red granulation within the wound bed. There is a medium (34-66%) amount of necrotic tissue within the wound bed including Adherent Slough. The periwound skin appearance exhibited: Scarring, Maceration. Periwound temperature was noted as No Abnormality. The periwound has tenderness on palpation. Wound #4 status is Open. Original cause of wound was Gradually Appeared. The wound is located on the Right,Medial Lower Leg. The wound measures 0cm length x 0cm width x 0cm depth; 0cm^2 area and 0cm^3 volume. There is Fat Layer (Subcutaneous Tissue) Exposed exposed. There is no tunneling or undermining noted. There is a none present amount of drainage noted. The wound margin is distinct with the outline attached to the wound base. There is no granulation within the wound bed. There is no necrotic tissue within the wound bed. The periwound skin appearance exhibited: Scarring. The periwound skin appearance did not exhibit: Maceration. Periwound temperature was noted as No Abnormality. The periwound has tenderness on palpation. Wound #6 status is Open. Original cause of wound was Bump. The wound is located on the Left,Lateral Lower Leg. The wound  measures 0.2cm length x 0.4cm width x 0.2cm depth; 0.063cm^2 area and 0.013cm^3 volume. There is no tunneling or undermining noted. There is a large amount of [name], Jordan J. ([id number]) serosanguineous drainage noted. The wound margin is indistinct and nonvisible. There is no granulation within the wound bed. There is a large (67-100%) amount of necrotic tissue within the wound bed including Adherent Slough. The periwound skin appearance exhibited: Induration, Erythema. The periwound skin appearance did not exhibit: Ecchymosis. The surrounding wound skin color is noted with erythema which is circumferential. 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 Cleanse wound with mild soap and water Wound #3 Left,Medial Lower Leg: Clean wound with Normal Saline. Cleanse wound with mild soap and water Wound #6 Left,Lateral Lower Leg: Clean wound with Normal Saline. Cleanse wound with mild soap and water Anesthetic: Wound #2 Right,Lateral Malleolus: Topical Lidocaine 4% cream applied to wound bed prior to debridement - clinic use only Wound #3 Left,Medial Lower Leg: Topical Lidocaine 4% cream applied to wound bed prior to debridement - clinic use only Wound #6 Left,Lateral Lower Leg: Topical Lidocaine 4% cream applied to wound bed prior to debridement - clinic use only Skin Barriers/Peri-Wound Care: Wound #2 Right,Lateral Malleolus: Barrier cream Wound #3 Left,Medial Lower Leg: Barrier  cream Wound #6 Left,Lateral Lower Leg: [name], FANTROY. ([id number]) Barrier cream Primary Wound Dressing: Wound #2 Right,Lateral Malleolus: Aquacel Ag Wound #3 Left,Medial Lower Leg: Aquacel Ag Wound #6 Left,Lateral Lower Leg: Aquacel Ag Secondary Dressing: Wound #2 Right,Lateral Malleolus: Dry Gauze Foam Wound #3 Left,Medial Lower Leg: Dry Gauze Foam Wound #6 Left,Lateral Lower Leg: Dry Gauze Foam Dressing Change Frequency:  Wound #2 Right,Lateral Malleolus: Change dressing every week Wound #3 Left,Medial Lower Leg: Change dressing every week Wound #6 Left,Lateral Lower Leg: Change dressing every week Follow-up Appointments: Wound #2 Right,Lateral Malleolus: Return Appointment in 1 week. Nurse Visit as needed Wound #3 Left,Medial Lower Leg: Return Appointment in 1 week. Nurse Visit as needed Wound #6 Left,Lateral Lower Leg: Return Appointment in 1 week. Nurse Visit as needed Edema Control: Wound #2 Right,Lateral Malleolus: Kerlix and Coban - Bilateral - wrap lightly Elevate legs to the level of the heart and pump ankles as often as possible Wound #3 Left,Medial Lower Leg: Kerlix and Coban - Bilateral - wrap lightly Elevate legs to the level of the heart and pump ankles as often as possible Wound #6 Left,Lateral Lower Leg: Kerlix and Coban - Bilateral - wrap lightly Elevate legs to the level of the heart and pump ankles as often as possible Additional Orders / Instructions: Wound #2 Right,Lateral Malleolus: Increase protein intake. Wound #3 Left,Medial Lower Leg: Increase protein intake. Wound #6 Left,Lateral Lower Leg: [name], Jordan J. ([id number]) Increase protein intake. #1 Aquacel Ag to all wound areas, foam, Kerlix and Coban #2 patient has really made nice improvement Electronic Signature(s) Signed: 06/09/2017 5:06:34 PM By: Baltazar Najjar MD Entered By: Baltazar Najjar on 06/09/2017 16:28:58 [name], [name]  ([id number]) -------------------------------------------------------------------------------- SuperBill Details [name] Date of Service: 06/09/2017 Medical Record Patient Account Number: [id number] Number: Treating RN: [name] Jul 01, 1931 (83 y.o. 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 L97.221 Non-pressure chronic ulcer of left calf limited to breakdown of skin I70.233 Atherosclerosis of native arteries of right leg with ulceration of ankle I70.243 Atherosclerosis of native arteries of left leg with ulceration of ankle Facility Procedures CPT4 Code: [id number] Description: 99214 - WOUND CARE VISIT-LEV 4 EST PT Modifier: Quantity: 1 Physician Procedures CPT4: Description Modifier Quantity Code [id number] 99213 - WC PHYS LEVEL 3 - EST PT 1 ICD-10 Description Diagnosis I87.333 Chronic venous hypertension (idiopathic) with ulcer and inflammation of bilateral lower extremity L97.313 Non-pressure chronic ulcer  of right ankle with necrosis of muscle L97.221 Non-pressure chronic ulcer of left calf limited to breakdown of skin Electronic Signature(s) Signed: 06/09/2017 4:41:02 PM By: [name] Signed: 06/09/2017 5:06:34 PM By: Baltazar Najjar MD Entered By: [name] on 06/09/2017 16:33:22

## 2017-06-10 NOTE — Progress Notes (Signed)
[name], [name] ([id number]) Visit Report for 06/09/2017 Arrival Information Details Patient Name: Jordan, Hopkins. Date of Service: 06/09/2017 3:30 PM Medical Record Patient Account Number: [id number] Number: Treating RN: [name] 1931-05-11 (83 y.o. Other Clinician: Date of Birth/Sex: Female) Treating ROBSON, MICHAEL Primary Care Yuleni Burich: Guy Begin Signa Cheek/Extender: G Referring Mackson Botz: [name] in Treatment: 17 Visit Information History Since Last Visit All ordered tests and consults were completed: No Patient Arrived: Wheel Chair Added or deleted any medications: No Arrival Time: 15:30 Any new allergies or adverse reactions: No Accompanied By: son Had a fall or experienced change in No Transfer Assistance: EasyPivot Patient activities of daily living that may affect Lift risk of falls: Patient Identification Verified: Yes Signs or symptoms of abuse/neglect since last No Secondary Verification Process Yes visito Completed: Hospitalized since last visit: No Patient Requires Transmission- No Has Dressing in Place as Prescribed: Yes Based Precautions: Has Compression in Place as Prescribed: Yes Patient Has Alerts: Yes Pain Present Now: No Patient Alerts: Patient on Blood Thinner Electronic Signature(s) Signed: 06/09/2017 4:41:02 PM By: [name] Entered By: [name] on 06/09/2017 15:34:05 Emmick, [name] -------------------------------------------------------------------------------- Clinic Level of Care Assessment Details [name] Date of Service: 06/09/2017 3:30 PM Medical Record Patient Account Number: [id number] Number: Treating RN: [name] 03-Sep-1931 (83 y.o. Other Clinician: Date of Birth/Sex: Female) Treating ROBSON, MICHAEL Primary Care Jaziel Bennett: Guy Begin Shadawn Hanaway/Extender: G Referring Gurshaan Matsuoka: [name] in Treatment: 17 Clinic Level  of Care Assessment Items TOOL 4 Quantity Score X - Use when only an EandM is performed on FOLLOW-UP visit 1 0 ASSESSMENTS - Nursing Assessment / Reassessment X - Reassessment of Co-morbidities (includes updates in patient status) 1 10 X - Reassessment of Adherence to Treatment Plan 1 5 ASSESSMENTS - Wound and Skin Assessment / Reassessment []  - Simple Wound Assessment / Reassessment - one wound 0 X - Complex Wound Assessment / Reassessment - multiple wounds 3 5 []  - Dermatologic / Skin Assessment (not related to wound area) 0 ASSESSMENTS - Focused Assessment []  - Circumferential Edema Measurements - multi extremities 0 []  - Nutritional Assessment / Counseling / Intervention 0 []  - Lower Extremity Assessment (monofilament, tuning fork, pulses) 0 []  - Peripheral Arterial Disease Assessment (using hand held doppler) 0 ASSESSMENTS - Ostomy and/or Continence Assessment and Care []  - Incontinence Assessment and Management 0 []  - Ostomy Care Assessment and Management (repouching, etc.) 0 PROCESS - Coordination of Care []  - Simple Patient / Family Education for ongoing care 0 X - Complex (extensive) Patient / Family Education for ongoing care 1 20 []  - Staff obtains Chiropractor, Records, Test Results / Process Orders 0 []  - Staff telephones HHA, Nursing Homes / Clarify orders / etc 0 Ha, [name] []  - Routine Transfer to another Facility (non-emergent condition) 0 []  - Routine Hospital Admission (non-emergent condition) 0 []  - New Admissions / Manufacturing engineer / Ordering NPWT, Apligraf, etc. 0 []  - Emergency Hospital Admission (emergent condition) 0 X - Simple Discharge Coordination 1 10 []  - Complex (extensive) Discharge Coordination 0 PROCESS - Special Needs []  - Pediatric / Minor Patient Management 0 []  - Isolation Patient Management 0 []  - Hearing / Language / Visual special needs 0 []  - Assessment of Community assistance (transportation, D/C planning, etc.) 0 []  -  Additional assistance / Altered mentation 0 []  - Support Surface(s) Assessment (bed, cushion, seat, etc.) 0 INTERVENTIONS - Wound Cleansing / Measurement []  - Simple Wound Cleansing -  one wound 0 X - Complex Wound Cleansing - multiple wounds 3 5 X - Wound Imaging (photographs - any number of wounds) 1 5 []  - Wound Tracing (instead of photographs) 0 []  - Simple Wound Measurement - one wound 0 X - Complex Wound Measurement - multiple wounds 3 5 INTERVENTIONS - Wound Dressings []  - Small Wound Dressing one or multiple wounds 0 []  - Medium Wound Dressing one or multiple wounds 0 X - Large Wound Dressing one or multiple wounds 2 20 X - Application of Medications - topical 1 5 []  - Application of Medications - injection 0 [name], [name]. ([id number]) INTERVENTIONS - Miscellaneous []  - External ear exam 0 []  - Specimen Collection (cultures, biopsies, blood, body fluids, etc.) 0 []  - Specimen(s) / Culture(s) sent or taken to Lab for analysis 0 []  - Patient Transfer (multiple staff / Michiel Sites Lift / Similar devices) 0 []  - Simple Staple / Suture removal (25 or less) 0 []  - Complex Staple / Suture removal (26 or more) 0 []  - Hypo / Hyperglycemic Management (close monitor of Blood Glucose) 0 []  - Ankle / Brachial Index (ABI) - do not check if billed separately 0 X - Vital Signs 1 5 Has the patient been seen at the hospital within the last three years: Yes Total Score: 145 Level Of Care: New/Established - Level 4 Electronic Signature(s) Signed: 06/09/2017 4:41:02 PM By: [name] Entered By: [name] on 06/09/2017 16:33:14 [name], [name] -------------------------------------------------------------------------------- Encounter Discharge Information Details [name] Date of Service: 06/09/2017 3:30 PM Medical Record Patient Account Number: [id number] Number: Treating RN: [name] 1931-08-24 (83 y.o. Other Clinician: Date of  Birth/Sex: Female) Treating ROBSON, MICHAEL Primary Care Tazia Illescas: Guy Begin Andrick Rust/Extender: G Referring Kenrick Pore: [name] in Treatment: 17 Encounter Discharge Information Items Discharge Pain Level: 0 Discharge Condition: Stable Ambulatory Status: Wheelchair Discharge Destination: Home Transportation: Private Auto Accompanied By: son Schedule Follow-up Appointment: Yes Medication Reconciliation completed and provided to Patient/Care No Paulette Rockford: Provided on Clinical Summary of Care: 06/09/2017 Form Type Recipient Paper Patient CF Electronic Signature(s) Signed: 06/09/2017 4:15:24 PM By: Gwenlyn Perking Entered By: Gwenlyn Perking on 06/09/2017 16:15:24 Charo, Jordan Rhyme ([id number]) -------------------------------------------------------------------------------- Multi Wound Chart Details [name] Date of Service: 06/09/2017 3:30 PM Medical Record Patient Account Number: [id number] Number: Treating RN: [name] 02-Apr-1931 (83 y.o. Other Clinician: Date of Birth/Sex: Female) Treating ROBSON, MICHAEL Primary Care Ura Hausen: Guy Begin Tamryn Popko/Extender: G Referring Olena Willy: Guy Begin Weeks in Treatment: 17 Vital Signs Height(in): 66 Pulse(bpm): 71 Weight(lbs): 159 Blood Pressure 146/75 (mmHg): Body Mass Index(BMI): 26 Temperature(F): 98.9 Respiratory Rate 16 (breaths/min): Photos: [2:No Photos] [3:No Photos] [4:No Photos] Wound Location: [2:Right Malleolus - Lateral] [3:Left Lower Leg - Medial] [4:Right Lower Leg - Medial] Wounding Event: [2:Gradually Appeared] [3:Gradually Appeared] [4:Gradually Appeared] Primary Etiology: [2:Venous Leg Ulcer] [3:Venous Leg Ulcer] [4:Venous Leg Ulcer] Comorbid History: [2:Cataracts, Arrhythmia, Hypertension, Osteoarthritis] [3:Cataracts, Arrhythmia, Hypertension, Osteoarthritis] [4:Cataracts, Arrhythmia, Hypertension, Osteoarthritis] Date Acquired: [2:01/19/2017]  [3:01/19/2017] [4:02/16/2017] Weeks of Treatment: [2:17] [3:17] [4:16] Wound Status: [2:Open] [3:Open] [4:Open] Measurements L x W x D 2x1x0.2 [3:1x1x0.2] [4:0x0x0] (cm) Area (cm) : [2:1.571] [3:0.785] [4:0] Volume (cm) : [2:0.314] [3:0.157] [4:0] % Reduction in Area: [2:62.00%] [3:-99.70%] [4:100.00%] % Reduction in Volume: 24.20% [3:-302.60%] [4:100.00%] Classification: [2:Full Thickness Without Exposed Support Structures] [3:Full Thickness Without Exposed Support Structures] [4:Full Thickness Without Exposed Support Structures] Exudate Amount: [2:Large] [3:Large] [4:None Present] Exudate Type: [2:Serosanguineous] [3:Serosanguineous] [4:N/A] Exudate Color: [2:red, brown] [3:red, brown] [  4:N/A] Wound Margin: [2:Distinct, outline attached] [3:Distinct, outline attached] [4:Distinct, outline attached] Granulation Amount: [2:Medium (34-66%)] [3:Medium (34-66%)] [4:None Present (0%)] Granulation Quality: [2:Red] [3:Red] [4:N/A] Necrotic Amount: [2:Medium (34-66%)] [3:Medium (34-66%)] [4:None Present (0%)] Exposed Structures: [2:Fat Layer (Subcutaneous Tissue) Exposed: Yes] [3:Fat Layer (Subcutaneous Tissue) Exposed: Yes] [4:Fat Layer (Subcutaneous Tissue) Exposed: Yes] Fascia: No Tendon: No Muscle: No Joint: No Bone: No Epithelialization: None None Large (67-100%) Periwound Skin Texture: Scarring: Yes Scarring: Yes Scarring: Yes Periwound Skin Maceration: Yes Maceration: Yes Maceration: No Moisture: Periwound Skin Color: No Abnormalities Noted No Abnormalities Noted No Abnormalities Noted Erythema Location: N/A N/A N/A Temperature: No Abnormality No Abnormality No Abnormality Tenderness on Yes Yes Yes Palpation: Wound Preparation: Ulcer Cleansing: Ulcer Cleansing: Ulcer Cleansing: Rinsed/Irrigated with Rinsed/Irrigated with Rinsed/Irrigated with Saline, Other: soap and Saline, Other: soap and Saline, Other: soap and water water water Topical Anesthetic Topical Anesthetic  Topical Anesthetic Applied: Other: lidocaine Applied: Other: lidocaine Applied: None 4% 4% Wound Number: 6 N/A N/A Photos: No Photos N/A N/A Wound Location: Left Lower Leg - Lateral N/A N/A Wounding Event: Bump N/A N/A Primary Etiology: Abscess N/A N/A Comorbid History: Cataracts, Arrhythmia, N/A N/A Hypertension, Osteoarthritis Date Acquired: 05/26/2017 N/A N/A Weeks of Treatment: 2 N/A N/A Wound Status: Open N/A N/A Measurements L x W x D 0.2x0.4x0.2 N/A N/A (cm) Area (cm) : 0.063 N/A N/A Volume (cm) : 0.013 N/A N/A % Reduction in Area: -293.70% N/A N/A % Reduction in Volume: 18.80% N/A N/A Classification: Full Thickness Without N/A N/A Exposed Support Structures Exudate Amount: Large N/A N/A Exudate Type: Serosanguineous N/A N/A Exudate Color: red, brown N/A N/A Wound Margin: Indistinct, nonvisible N/A N/A Granulation Amount: None Present (0%) N/A N/A Granulation Quality: N/A N/A N/A [name], [name]. ([id number]) Necrotic Amount: Large (67-100%) N/A N/A Exposed Structures: N/A N/A N/A Epithelialization: Medium (34-66%) N/A N/A Periwound Skin Texture: Induration: Yes N/A N/A Periwound Skin No Abnormalities Noted N/A N/A Moisture: Periwound Skin Color: Erythema: Yes N/A N/A Ecchymosis: No Erythema Location: Circumferential N/A N/A Temperature: N/A N/A N/A Tenderness on No N/A N/A Palpation: Wound Preparation: Ulcer Cleansing: Other: N/A N/A soap and water Topical Anesthetic Applied: Other: lidocaine 4% Treatment Notes Electronic Signature(s) Signed: 06/09/2017 4:41:02 PM By: Alejandro MullingPinkerton, Debra Entered By: Alejandro MullingPinkerton, Debra on 06/09/2017 15:51:54 [name], Jordan RhymeECIL Hopkins. ([id number]) -------------------------------------------------------------------------------- Multi-Disciplinary Care Plan Details [name], [name]. Date of Service: 06/09/2017 3:30 PM Medical Record Patient Account Number: [id number] Number: Treating RN: Phillis Haggisinkerton,  Debi 23-Mar-1931 (83 y.o. Other Clinician: Date of Birth/Sex: Female) Treating ROBSON, MICHAEL Primary Care Vitaly Wanat: [name], NARGES Jahnavi Muratore/Extender: G Referring Jonae Renshaw: Gabriel RungFARAHI, NARGES Weeks in Treatment: 17 Active Inactive ` Abuse / Safety / Falls / Self Care Management Nursing Diagnoses: Potential for falls Goals: Patient will remain injury free Date Initiated: 02/09/2017 Target Resolution Date: 04/17/2017 Goal Status: Active Interventions: Assess fall risk on admission and as needed Assess self care needs on admission and as needed Notes: ` Nutrition Nursing Diagnoses: Imbalanced nutrition Goals: Patient/caregiver agrees to and verbalizes understanding of need to use nutritional supplements and/or vitamins as prescribed Date Initiated: 02/09/2017 Target Resolution Date: 04/17/2017 Goal Status: Active Interventions: Assess patient nutrition upon admission and as needed per policy Notes: ` Orientation to the Wound Care Program [name], Jinelle Hopkins. ([id number]) Nursing Diagnoses: Knowledge deficit related to the wound healing center program Goals: Patient/caregiver will verbalize understanding of the Wound Healing Center Program Date Initiated: 02/09/2017 Target Resolution Date: 02/20/2017 Goal Status: Active Interventions: Provide education on orientation to the wound center Notes: `  Pain, Acute or Chronic Nursing Diagnoses: Pain, acute or chronic: actual or potential Potential alteration in comfort, pain Goals: Patient/caregiver will verbalize adequate pain control between visits Date Initiated: 02/09/2017 Target Resolution Date: 04/17/2017 Goal Status: Active Interventions: Assess comfort goal upon admission Complete pain assessment as per visit requirements Notes: ` Wound/Skin Impairment Nursing Diagnoses: Impaired tissue integrity Knowledge deficit related to smoking impact on wound healing Knowledge deficit related to ulceration/compromised skin  integrity Goals: Ulcer/skin breakdown will have a volume reduction of 80% by week 12 Date Initiated: 02/09/2017 Target Resolution Date: 04/10/2017 Goal Status: Active Interventions: Assess patient/caregiver ability to perform ulcer/skin care regimen upon admission and as needed CHAKA, BOYSON ([id number]) Assess ulceration(s) every visit Notes: Electronic Signature(s) Signed: 06/09/2017 4:41:02 PM By: [name] Entered By: [name] on 06/09/2017 15:51:42 [name], [name] ([id number]) -------------------------------------------------------------------------------- Pain Assessment Details [name] Date of Service: 06/09/2017 3:30 PM Medical Record Patient Account Number: [id number] Number: Treating RN: [name] 15-Mar-1931 (83 y.o. Other Clinician: Date of Birth/Sex: Female) Treating ROBSON, MICHAEL Primary Care Kaiden Dardis: Guy Begin Maxum Cassarino/Extender: G Referring Abanoub Hanken: [name] in Treatment: 17 Active Problems Location of Pain Severity and Description of Pain Patient Has Paino No Site Locations With Dressing Change: No Pain Management and Medication Current Pain Management: Electronic Signature(s) Signed: 06/09/2017 4:41:02 PM By: [name] Entered By: [name] on 06/09/2017 15:34:10 Rauf, Jordan Rhyme ([id number]) -------------------------------------------------------------------------------- Patient/Caregiver Education Details [name] Date of Service: 06/09/2017 3:30 PM Medical Record Patient Account Number: [id number] Number: Treating RN: [name] Oct 23, 1931 (83 y.o. Other Clinician: Date of Birth/Gender: Female) Treating ROBSON, MICHAEL Primary Care Physician: Guy Begin Physician/Extender: G Referring Physician: [name] in Treatment: 17 Education Assessment Education Provided To: Patient Education Topics Provided Wound/Skin  Impairment: Handouts: Other: change dressing as ordered Methods: Demonstration, Explain/Verbal Responses: State content correctly Electronic Signature(s) Signed: 06/09/2017 4:41:02 PM By: [name] Entered By: [name] on 06/09/2017 15:52:31 Plasse, Jordan Rhyme ([id number]) -------------------------------------------------------------------------------- Wound Assessment Details [name] Date of Service: 06/09/2017 3:30 PM Medical Record Patient Account Number: [id number] Number: Treating RN: [name] [birth year] (83 y.o. Other Clinician: Date of Birth/Sex: Female) Treating ROBSON, MICHAEL Primary Care Makailee Nudelman: Guy Begin Aylen Stradford/Extender: G Referring Avleen Bordwell: Guy Begin Weeks in Treatment: 17 Wound Status Wound Number: 2 Primary Venous Leg Ulcer Etiology: Wound Location: Right Malleolus - Lateral Wound Status: Open Wounding Event: Gradually Appeared Comorbid Cataracts, Arrhythmia, Hypertension, Date Acquired: 01/19/2017 History: Osteoarthritis Weeks Of Treatment: 17 Clustered Wound: No Photos Photo Uploaded By: [name] on 06/09/2017 16:28:52 Wound Measurements Length: (cm) 2 Width: (cm) 1 Depth: (cm) 0.2 Area: (cm) 1.571 Volume: (cm) 0.314 % Reduction in Area: 62% % Reduction in Volume: 24.2% Epithelialization: None Tunneling: No Undermining: No Wound Description Full Thickness Without Exposed Classification: Support Structures Wound Margin: Distinct, outline attached Exudate Large Amount: Exudate Type: Serosanguineous Exudate Color: red, brown Foul Odor After Cleansing: No Slough/Fibrino Yes Wound Bed Granulation Amount: Medium (34-66%) Exposed Structure [name], [name]. ([id number]) Granulation Quality: Red Fat Layer (Subcutaneous Tissue) Exposed: Yes Necrotic Amount: Medium (34-66%) Necrotic Quality: Adherent Slough Periwound Skin Texture Texture Color No Abnormalities Noted:  No No Abnormalities Noted: No Scarring: Yes Temperature / Pain Moisture Temperature: No Abnormality No Abnormalities Noted: No Tenderness on Palpation: Yes Maceration: Yes Wound Preparation Ulcer Cleansing: Rinsed/Irrigated with Saline, Other: soap and water, Topical Anesthetic Applied: Other: lidocaine 4%, Electronic Signature(s) Signed: 06/09/2017 4:41:02 PM By: [name] Entered By: [name]  on 06/09/2017 15:50:08 COREE, RIESTER ([id number]) -------------------------------------------------------------------------------- Wound Assessment Details Patient Name: TYONNA, TALERICO. Date of Service: 06/09/2017 3:30 PM Medical Record Patient Account Number: [id number] Number: Treating RN: [name] 16-May-1931 (83 y.o. Other Clinician: Date of Birth/Sex: Female) Treating ROBSON, MICHAEL Primary Care Branko Steeves: Guy Begin Zaccai Chavarin/Extender: G Referring Faraz Ponciano: Guy Begin Weeks in Treatment: 17 Wound Status Wound Number: 3 Primary Venous Leg Ulcer Etiology: Wound Location: Left Lower Leg - Medial Wound Status: Open Wounding Event: Gradually Appeared Comorbid Cataracts, Arrhythmia, Hypertension, Date Acquired: 01/19/2017 History: Osteoarthritis Weeks Of Treatment: 17 Clustered Wound: No Photos Photo Uploaded By: [name] on 06/09/2017 16:29:55 Wound Measurements Length: (cm) 1 Width: (cm) 1 Depth: (cm) 0.2 Area: (cm) 0.785 Volume: (cm) 0.157 % Reduction in Area: -99.7% % Reduction in Volume: -302.6% Epithelialization: None Tunneling: No Undermining: No Wound Description Full Thickness Without Exposed Classification: Support Structures Wound Margin: Distinct, outline attached Exudate Large Amount: Exudate Type: Serosanguineous Exudate Color: red, brown Foul Odor After Cleansing: No Slough/Fibrino Yes Wound Bed Granulation Amount: Medium (34-66%) Exposed Structure [name], [name]. ([id number]) Granulation  Quality: Red Fat Layer (Subcutaneous Tissue) Exposed: Yes Necrotic Amount: Medium (34-66%) Necrotic Quality: Adherent Slough Periwound Skin Texture Texture Color No Abnormalities Noted: No No Abnormalities Noted: No Scarring: Yes Temperature / Pain Moisture Temperature: No Abnormality No Abnormalities Noted: No Tenderness on Palpation: Yes Maceration: Yes Wound Preparation Ulcer Cleansing: Rinsed/Irrigated with Saline, Other: soap and water, Topical Anesthetic Applied: Other: lidocaine 4%, Electronic Signature(s) Signed: 06/09/2017 4:41:02 PM By: [name] Entered By: [name] on 06/09/2017 15:50:26 Tuma, Jordan Rhyme ([id number]) -------------------------------------------------------------------------------- Wound Assessment Details [name] Date of Service: 06/09/2017 3:30 PM Medical Record Patient Account Number: [id number] Number: Treating RN: [name] [birth year] (83 y.o. Other Clinician: Date of Birth/Sex: Female) Treating ROBSON, MICHAEL Primary Care Meyer Dockery: Guy Begin Sydney Azure/Extender: G Referring Roanne Haye: Guy Begin Weeks in Treatment: 17 Wound Status Wound Number: 4 Primary Venous Leg Ulcer Etiology: Wound Location: Right Lower Leg - Medial Wound Status: Open Wounding Event: Gradually Appeared Comorbid Cataracts, Arrhythmia, Hypertension, Date Acquired: 02/16/2017 History: Osteoarthritis Weeks Of Treatment: 16 Clustered Wound: No Photos Photo Uploaded By: [name] on 06/09/2017 16:29:56 Wound Measurements Length: (cm) 0 % Reduction in Width: (cm) 0 % Reduction in Depth: (cm) 0 Epithelializat Area: (cm) 0 Tunneling: Volume: (cm) 0 Undermining: Area: 100% Volume: 100% ion: Large (67-100%) No No Wound Description Full Thickness Without Exposed Classification: Support Structures Wound Margin: Distinct, outline attached Exudate None Present Amount: Foul Odor After  Cleansing: No Slough/Fibrino No Wound Bed Granulation Amount: None Present (0%) Exposed Structure Necrotic Amount: None Present (0%) Fascia Exposed: No Fat Layer (Subcutaneous Tissue) Exposed: Yes Roundtree, Ciin Hopkins. ([id number]) Tendon Exposed: No Muscle Exposed: No Joint Exposed: No Bone Exposed: No Periwound Skin Texture Texture Color No Abnormalities Noted: No No Abnormalities Noted: No Scarring: Yes Temperature / Pain Moisture Temperature: No Abnormality No Abnormalities Noted: No Tenderness on Palpation: Yes Maceration: No Wound Preparation Ulcer Cleansing: Rinsed/Irrigated with Saline, Other: soap and water, Topical Anesthetic Applied: None Electronic Signature(s) Signed: 06/09/2017 4:41:02 PM By: [name] Entered By: [name] on 06/09/2017 15:50:56 Kontos, Jordan Rhyme ([id number]) -------------------------------------------------------------------------------- Wound Assessment Details [name] Date of Service: 06/09/2017 3:30 PM Medical Record Patient Account Number: [id number] Number: Treating RN: [name] [birth year] (83 y.o. Other Clinician: Date of Birth/Sex: Female) Treating ROBSON, MICHAEL Primary Care Millee Denise: Guy Begin Johany Hansman/Extender: G Referring Malynn Lucy: Guy Begin Weeks in Treatment: 17 Wound Status Wound  Number: 6 Primary Abscess Etiology: Wound Location: Left Lower Leg - Lateral Wound Status: Open Wounding Event: Bump Comorbid Cataracts, Arrhythmia, Hypertension, Date Acquired: 05/26/2017 History: Osteoarthritis Weeks Of Treatment: 2 Clustered Wound: No Photos Photo Uploaded By: [name] on 06/09/2017 16:30:37 Wound Measurements Length: (cm) 0.2 Width: (cm) 0.4 Depth: (cm) 0.2 Area: (cm) 0.063 Volume: (cm) 0.013 % Reduction in Area: -293.7% % Reduction in Volume: 18.8% Epithelialization: Medium (34-66%) Tunneling: No Undermining: No Wound Description Full  Thickness Without Exposed Classification: Support Structures Wound Margin: Indistinct, nonvisible Exudate Large Amount: Exudate Type: Serosanguineous Exudate Color: red, brown Foul Odor After Cleansing: No Slough/Fibrino No Wound Bed Granulation Amount: None Present (0%) Condron, Shemica Hopkins. ([id number]) Necrotic Amount: Large (67-100%) Necrotic Quality: Adherent Slough Periwound Skin Texture Texture Color No Abnormalities Noted: No No Abnormalities Noted: No Induration: Yes Ecchymosis: No Erythema: Yes Moisture Erythema Location: Circumferential No Abnormalities Noted: No Wound Preparation Ulcer Cleansing: Other: soap and water, Topical Anesthetic Applied: Other: lidocaine 4%, Electronic Signature(s) Signed: 06/09/2017 4:41:02 PM By: [name] Entered By: [name] on 06/09/2017 15:51:28 Stormes, [name] -------------------------------------------------------------------------------- Vitals Details [name] Date of Service: 06/09/2017 3:30 PM Medical Record Patient Account Number: [id number] Number: Treating RN: [name] Mar 10, 1931 (83 y.o. Other Clinician: Date of Birth/Sex: Female) Treating ROBSON, MICHAEL Primary Care Loys Shugars: Guy Begin Mihir Flanigan/Extender: G Referring Derrico Zhong: Guy Begin Weeks in Treatment: 17 Vital Signs Time Taken: 15:34 Temperature (F): 98.9 Height (in): 66 Pulse (bpm): 71 Weight (lbs): 159 Respiratory Rate (breaths/min): 16 Body Mass Index (BMI): 25.7 Blood Pressure (mmHg): 146/75 Reference Range: 80 - 120 mg / dl Electronic Signature(s) Signed: 06/09/2017 4:41:02 PM By: [name] Entered By: [name] on 06/09/2017 15:34:28

## 2017-06-11 ENCOUNTER — Telehealth: Payer: Self-pay | Admitting: Cardiovascular Disease

## 2017-06-11 NOTE — Telephone Encounter (Signed)
Pt calling did we change the dose on her Amlodipine   She is not sure if she is to take 10 mg or 5 mg  Please advise

## 2017-06-11 NOTE — Telephone Encounter (Signed)
S/w pt's daughter, [place]ene, ([place] who inquires of amlodipine dosage. Amlodipine was decreased to 5mg  at May 8 OV d/t low BP. Reviewed w/Julene who verbalized understanding

## 2017-06-17 ENCOUNTER — Encounter: Payer: Medicare Other | Attending: Internal Medicine

## 2017-06-17 DIAGNOSIS — I10 Essential (primary) hypertension: Secondary | ICD-10-CM | POA: Diagnosis not present

## 2017-06-17 DIAGNOSIS — L97221 Non-pressure chronic ulcer of left calf limited to breakdown of skin: Secondary | ICD-10-CM | POA: Insufficient documentation

## 2017-06-17 DIAGNOSIS — L97313 Non-pressure chronic ulcer of right ankle with necrosis of muscle: Secondary | ICD-10-CM | POA: Diagnosis not present

## 2017-06-17 DIAGNOSIS — I70243 Atherosclerosis of native arteries of left leg with ulceration of ankle: Secondary | ICD-10-CM | POA: Diagnosis not present

## 2017-06-17 DIAGNOSIS — I70233 Atherosclerosis of native arteries of right leg with ulceration of ankle: Secondary | ICD-10-CM | POA: Insufficient documentation

## 2017-06-17 DIAGNOSIS — I87333 Chronic venous hypertension (idiopathic) with ulcer and inflammation of bilateral lower extremity: Secondary | ICD-10-CM | POA: Insufficient documentation

## 2017-06-17 NOTE — Progress Notes (Signed)
[name], [name] ([id number]) Visit Report for 06/17/2017 Arrival Information Details [name], [name]. Date of Service: 06/17/2017 11:00 AM Medical Record Number: [id number] Patient Account Number: [id number] Date of Birth/Sex: 01-19-1931 (83 y.o. Female) Treating RN: Huel Coventry Primary Care Shawnese Magner: Guy Begin Other Clinician: Referring Emanuelle Hammerstrom: Guy Begin Treating Laraine Samet/Extender: Rudene Re in Treatment: 18 Visit Information History Since Last Visit Added or deleted any medications: No Patient Arrived: Wheel Chair Any new allergies or adverse reactions: No Arrival Time: 10:49 Had a fall or experienced change in No Accompanied By: daughter activities of daily living that may affect Transfer Assistance: None risk of falls: Patient Identification Verified: Yes Signs or symptoms of abuse/neglect since last No Secondary Verification Process Yes visito Completed: Hospitalized since last visit: No Patient Requires Transmission- No Has Dressing in Place as Prescribed: Yes Based Precautions: Has Compression in Place as Prescribed: Yes Patient Has Alerts: Yes Pain Present Now: No Patient Alerts: Patient on Blood Thinner Electronic Signature(s) Signed: 06/17/2017 3:33:51 PM By: Elliot Gurney, BSN, RN, CWS, Kim RN, BSN Entered By: Elliot Gurney, BSN, RN, CWS, Kim on 06/17/2017 10:53:36 Durnin, [name] ([id number]) -------------------------------------------------------------------------------- Clinic Level of Care Assessment Details [name], [name]. Date of Service: 06/17/2017 11:00 AM Medical Record Number: [id number] Patient Account Number: [id number] Date of Birth/Sex: January 20, 1931 (83 y.o. Female) Treating RN: Huel Coventry Primary Care Tyree Vandruff: Guy Begin Other Clinician: Referring Camara Renstrom: Guy Begin Treating Gerren Hoffmeier/Extender: Rudene Re in Treatment: 18 Clinic Level of Care Assessment Items TOOL 4 Quantity Score []  - Use when  only an EandM is performed on FOLLOW-UP visit 0 ASSESSMENTS - Nursing Assessment / Reassessment []  - Reassessment of Co-morbidities (includes updates in patient status) 0 []  - Reassessment of Adherence to Treatment Plan 0 ASSESSMENTS - Wound and Skin Assessment / Reassessment []  - Simple Wound Assessment / Reassessment - one wound 0 X - Complex Wound Assessment / Reassessment - multiple wounds 3 5 []  - Dermatologic / Skin Assessment (not related to wound area) 0 ASSESSMENTS - Focused Assessment []  - Circumferential Edema Measurements - multi extremities 0 []  - Nutritional Assessment / Counseling / Intervention 0 []  - Lower Extremity Assessment (monofilament, tuning fork, pulses) 0 []  - Peripheral Arterial Disease Assessment (using hand held doppler) 0 ASSESSMENTS - Ostomy and/or Continence Assessment and Care []  - Incontinence Assessment and Management 0 []  - Ostomy Care Assessment and Management (repouching, etc.) 0 PROCESS - Coordination of Care []  - Simple Patient / Family Education for ongoing care 0 []  - Complex (extensive) Patient / Family Education for ongoing care 0 []  - Staff obtains Chiropractor, Records, Test Results / Process Orders 0 []  - Staff telephones HHA, Nursing Homes / Clarify orders / etc 0 []  - Routine Transfer to another Facility (non-emergent condition) 0 [name], Jordan J. ([id number]) []  - Routine Hospital Admission (non-emergent condition) 0 []  - New Admissions / Manufacturing engineer / Ordering NPWT, Apligraf, etc. 0 []  - Emergency Hospital Admission (emergent condition) 0 []  - Simple Discharge Coordination 0 []  - Complex (extensive) Discharge Coordination 0 PROCESS - Special Needs []  - Pediatric / Minor Patient Management 0 []  - Isolation Patient Management 0 []  - Hearing / Language / Visual special needs 0 []  - Assessment of Community assistance (transportation, D/C planning, etc.) 0 []  - Additional assistance / Altered mentation 0 []  - Support Surface(s)  Assessment (bed, cushion, seat, etc.) 0 INTERVENTIONS - Wound Cleansing / Measurement []  - Simple Wound Cleansing - one wound 0 X - Complex Wound Cleansing -  multiple wounds 3 5 X - Wound Imaging (photographs - any number of wounds) 1 5 []  - Wound Tracing (instead of photographs) 0 []  - Simple Wound Measurement - one wound 0 X - Complex Wound Measurement - multiple wounds 3 5 INTERVENTIONS - Wound Dressings []  - Small Wound Dressing one or multiple wounds 0 X - Medium Wound Dressing one or multiple wounds 2 15 []  - Large Wound Dressing one or multiple wounds 0 []  - Application of Medications - topical 0 []  - Application of Medications - injection 0 INTERVENTIONS - Miscellaneous []  - External ear exam 0 [name], Jordan J. ([id number]) []  - Specimen Collection (cultures, biopsies, blood, body fluids, etc.) 0 []  - Specimen(s) / Culture(s) sent or taken to Lab for analysis 0 []  - Patient Transfer (multiple staff / Michiel Sites Lift / Similar devices) 0 []  - Simple Staple / Suture removal (25 or less) 0 []  - Complex Staple / Suture removal (26 or more) 0 []  - Hypo / Hyperglycemic Management (close monitor of Blood Glucose) 0 []  - Ankle / Brachial Index (ABI) - do not check if billed separately 0 []  - Vital Signs 0 Has the patient been seen at the hospital within the last three years: Yes Total Score: 80 Level Of Care: New/Established - Level 3 Electronic Signature(s) Signed: 06/17/2017 3:33:51 PM By: Elliot Gurney, BSN, RN, CWS, Kim RN, BSN Entered By: Elliot Gurney, BSN, RN, CWS, Kim on 06/17/2017 15:27:26 [name], [name] ([id number]) -------------------------------------------------------------------------------- Encounter Discharge Information Details [name] Date of Service: 06/17/2017 11:00 AM Medical Record Number: [id number] Patient Account Number: [id number] Date of Birth/Sex: January 20, 1931 (83 y.o. Female) Treating RN: Huel Coventry Primary Care Loyola Santino: Guy Begin Other  Clinician: Referring Srinika Delone: Guy Begin Treating Yuvonne Lanahan/Extender: Rudene Re in Treatment: 76 Encounter Discharge Information Items Discharge Pain Level: 0 Discharge Condition: Stable Ambulatory Status: Wheelchair Discharge Destination: Home Private Transportation: Auto Accompanied By: daughter Schedule Follow-up Appointment: Yes Medication Reconciliation completed and Yes provided to Patient/Care [name]: Clinical Summary of Care: Electronic Signature(s) Signed: 06/17/2017 3:33:51 PM By: Elliot Gurney, BSN, RN, CWS, Kim RN, BSN Entered By: Elliot Gurney, BSN, RN, CWS, Kim on 06/17/2017 11:19:04 [name] ([id number]) -------------------------------------------------------------------------------- Patient/Caregiver Education Details [name] Date of Service: 06/17/2017 11:00 AM Medical Record Number: [id number] Patient Account Number: [id number] Date of Birth/Gender: 22-Oct-1931 (83 y.o. Female) Treating RN: Huel Coventry Primary Care Physician: Guy Begin Other Clinician: Referring Physician: Guy Begin Treating Physician/Extender: Rudene Re in Treatment: 24 Education Assessment Education Provided To: Caregiver Education Topics Provided Venous: Handouts: Controlling Swelling with Multilayered Compression Wraps Methods: Demonstration, Explain/Verbal Responses: State content correctly Electronic Signature(s) Signed: 06/17/2017 3:33:51 PM By: Elliot Gurney, BSN, RN, CWS, Kim RN, BSN Entered By: Elliot Gurney, BSN, RN, CWS, Kim on 06/17/2017 11:18:44 [name] ([id number]) -------------------------------------------------------------------------------- Wound Assessment Details [name], [name]. Date of Service: 06/17/2017 11:00 AM Medical Record Number: [id number] Patient Account Number: [id number] Date of Birth/Sex: 1931-02-01 (83 y.o. Female) Treating RN: Huel Coventry Primary Care Reylene Stauder: Guy Begin Other  Clinician: Referring Drusilla Wampole: Guy Begin Treating Zayaan Kozak/Extender: Rudene Re in Treatment: 18 Wound Status Wound Number: 2 Primary Etiology: Venous Leg Ulcer Wound Location: Right, Lateral Malleolus Wound Status: Open Wounding Event: Gradually Appeared Date Acquired: 01/19/2017 Weeks Of Treatment: 18 Clustered Wound: No Photos Photo Uploaded By: Elliot Gurney, BSN, RN, CWS, Kim on 06/17/2017 12:48:48 Wound Measurements Length: (cm) 0.7 Width: (cm) 1 Depth: (cm) 0.1 Area: (cm) 0.55 Volume: (cm) 0.055 % Reduction in Area: 86.7% % Reduction  in Volume: 86.7% Wound Description Full Thickness Without Exposed Classification: Support Structures Periwound Skin Texture Texture Color No Abnormalities Noted: No No Abnormalities Noted: No Moisture No Abnormalities Noted: No Treatment Notes Wound #2 (Right, Lateral Malleolus) 1. Cleansed with: Clean wound with Normal Saline Cairns, Aroura J. ([id number]) Cleanse wound with antibacterial soap and water 3. Peri-wound Care: Barrier cream 4. Dressing Applied: Aquacel Ag 5. Secondary Dressing Applied ABD Pad 7. Secured with Tape Notes kerlix, Theatre managercoban Electronic Signature(s) Signed: 06/17/2017 3:33:51 PM By: Elliot GurneyWoody, BSN, RN, CWS, Kim RN, BSN Entered By: Elliot GurneyWoody, BSN, RN, CWS, Kim on 06/17/2017 11:03:55 [name], Jordan J. ([id number]) -------------------------------------------------------------------------------- Wound Assessment Details [name], Jordan J. Date of Service: 06/17/2017 11:00 AM Medical Record Number: [id number] Patient Account Number: [id number] Date of Birth/Sex: 08-04-1931 (83 y.o. Female) Treating RN: Huel CoventryWoody, Kim Primary Care Jaculin Rasmus: [name], NARGES Other Clinician: Referring Khary Schaben: [name], NARGES Treating Friedrich [name]/Extender: Rudene ReBritto, Errol Weeks in Treatment: 18 Wound Status Wound Number: 3 Primary Etiology: Venous Leg Ulcer Wound Location: Left, Medial Lower Leg Wound Status: Open Wounding  Event: Gradually Appeared Date Acquired: 01/19/2017 Weeks Of Treatment: 18 Clustered Wound: No Photos Photo Uploaded By: Elliot GurneyWoody, BSN, RN, CWS, Kim on 06/17/2017 12:48:48 Wound Measurements Length: (cm) 0.6 Width: (cm) 1 Depth: (cm) 0.2 Area: (cm) 0.471 Volume: (cm) 0.094 % Reduction in Area: -19.8% % Reduction in Volume: -141% Wound Description Full Thickness Without Exposed Classification: Support Structures Periwound Skin Texture Texture Color No Abnormalities Noted: No No Abnormalities Noted: No Moisture No Abnormalities Noted: No Treatment Notes Wound #3 (Left, Medial Lower Leg) 1. Cleansed with: Clean wound with Normal Saline [name], [name] J. ([id number]) Cleanse wound with antibacterial soap and water 3. Peri-wound Care: Barrier cream 4. Dressing Applied: Aquacel Ag 5. Secondary Dressing Applied ABD Pad 7. Secured with Tape Notes kerlix, Theatre managercoban Electronic Signature(s) Signed: 06/17/2017 3:33:51 PM By: Elliot GurneyWoody, BSN, RN, CWS, Kim RN, BSN Entered By: Elliot GurneyWoody, BSN, RN, CWS, Kim on 06/17/2017 11:03:55 [name], Lindalee J. ([id number]) -------------------------------------------------------------------------------- Wound Assessment Details [name], Ethyl J. Date of Service: 06/17/2017 11:00 AM Medical Record Number: [id number] Patient Account Number: [id number] Date of Birth/Sex: 08-04-1931 (83 y.o. Female) Treating RN: Huel CoventryWoody, Kim Primary Care Ailis Rigaud: [name], NARGES Other Clinician: Referring Roland Lipke: [name], NARGES Treating Deneice Wack/Extender: Rudene ReBritto, Errol Weeks in Treatment: 18 Wound Status Wound Number: 6 Primary Etiology: Abscess Wound Location: Left, Lateral Lower Leg Wound Status: Open Wounding Event: Bump Date Acquired: 05/26/2017 Weeks Of Treatment: 3 Clustered Wound: No Photos Photo Uploaded By: Elliot GurneyWoody, BSN, RN, CWS, Kim on 06/17/2017 12:48:48 Wound Measurements Length: (cm) 0.2 Width: (cm) 0.4 Depth: (cm) 0.2 Area: (cm) 0.063 Volume:  (cm) 0.013 % Reduction in Area: -293.7% % Reduction in Volume: 18.8% Wound Description Full Thickness Without Exposed Classification: Support Structures Periwound Skin Texture Texture Color No Abnormalities Noted: No No Abnormalities Noted: No Moisture No Abnormalities Noted: No Treatment Notes Wound #6 (Left, Lateral Lower Leg) 1. Cleansed with: Clean wound with Normal Saline Boquet, Kaysee J. ([id number]) Cleanse wound with antibacterial soap and water 3. Peri-wound Care: Barrier cream 4. Dressing Applied: Aquacel Ag 5. Secondary Dressing Applied ABD Pad 7. Secured with Tape Notes kerlix, Theatre managercoban Electronic Signature(s) Signed: 06/17/2017 3:33:51 PM By: Elliot GurneyWoody, BSN, RN, CWS, Kim RN, BSN Entered By: Elliot GurneyWoody, BSN, RN, CWS, Kim on 06/17/2017 11:03:56

## 2017-06-18 ENCOUNTER — Ambulatory Visit: Payer: Medicare Other | Admitting: Cardiovascular Disease

## 2017-06-25 ENCOUNTER — Encounter: Payer: Medicare Other | Admitting: Physician Assistant

## 2017-06-25 DIAGNOSIS — I87333 Chronic venous hypertension (idiopathic) with ulcer and inflammation of bilateral lower extremity: Secondary | ICD-10-CM | POA: Diagnosis not present

## 2017-06-27 NOTE — Progress Notes (Signed)
[name], Jordan J. ([id number]) Visit Report for 06/25/2017 Chief Complaint Document Details [name], Jordan J. Date of Service: 06/25/2017 1:30 PM Medical Record Number: [id number] Patient Account Number: [id number] Date of Birth/Sex: [birth year] (83 y.o. Female) Treating RN: Ashok CordiaPinkerton, Debi Primary Care Provider: [name], NARGES Other Clinician: Referring Provider: [name], NARGES Treating Provider/Extender: Linwood DibblesSTONE III, HOYT Weeks in Treatment: 2119 Information Obtained from: Patient Chief Complaint the patient is here for follow-up evaluation of her bilateral lower extremity ulcers Electronic Signature(s) Signed: 06/25/2017 6:13:19 PM By: Lenda KelpStone III, Hoyt PA-C Entered By: Lenda KelpStone III, Hoyt on 06/25/2017 13:27:29 Simkin, [name] ([id number]) -------------------------------------------------------------------------------- HPI Details [name], Jordan J. Date of Service: 06/25/2017 1:30 PM Medical Record Number: [id number] Patient Account Number: [id number] Date of Birth/Sex: [birth year] (83 y.o. Female) Treating RN: Ashok CordiaPinkerton, Debi Primary Care Provider: [name], NARGES Other Clinician: Referring Provider: [name], NARGES Treating Provider/Extender: Linwood DibblesSTONE III, HOYT Weeks in Treatment: 19 History of Present Illness HPI Description: 06/10/16; this is an 83 year old woman who [place]. She saw her primary doctor roughly over a week ago and was referred to the ER for a cellulitis in the right medial leg. She was given a round of IV antibiotics in the emergency room and discharged on oral Keflex which she is taking. She is been left with a uncomfortable leg area on the right medial lower leg. Her ABIs in this clinic were 0.8 on the right 0.6 on the left. She does not describe claudication. She is not a diabetic. Lab work in the ER showed a normal BUN and creatinine on 6/20 at 18 and 0.92 respectively white count was 10.1 hemoglobin at 13.8. She did not  have any imaging studies. I can see no vascular evaluations in Epic. She does not have a prior wound history 06/17/16; the area affected on her right medial ankle is an area I thought was due to tissue damage from cellulitis last week. She is going for venous reflux studies this afternoon, these were not ordered in this clinic at least not by me. The patient does not give a prior history of damage to the skin in this area although I wonder if she actually might not of noticed it. We have been using's Aquacel Ag under a wrap although we will not be able to wrap her today 06/24/16: pt reports today she hasn't heard from the vascular clinic regarding proceeding with arterial studies. our office will assist with this today. she denies s/s of infection. denies problems with her wraps. 07/07/16; since I last saw this woman she was admitted to hospital from 7/16 through 7/18 with sepsis syndrome felt to be secondary to her ulcer. She was treated with Vanco and Zosyn the hospital discharged on Septra. Blood cultures were negative. Urine culture showed multiple species she is now feeling well using Santyl with border foam to her wound. She has [facility] 07/14/16; no major improvement here. She has a quarter-sized wound covered with thick adherent slough on the lateral aspect of her right leg with several surrounding satellite lesions in a similar state. She has had 2 recent bouts of cellulitis, one before she came to this clinic and one required a recent hospitalization. I have asked for arterial studies I don't believe these have ever been done. My notes suggest from early in July that she went for reflux studies although I don't know that I have ever seen these either, she did have a DVT rule out but I did not order  this, this did not show a DVT but did show a Baker's cyst 07/21/2016 -- the patient had an arterial duplex study done with waveform suggestive of right femoropopliteal disease. There is  also small vessel disease bilaterally. Right ABI was in the normal range of 1.0 and a TBI was 0.74. Left ABI is abnormal at 0.69 with a TBI of 0.40. Three-vessel runoff on the right and a two-vessel runoff on the left. She has an appointment to see Dr. Kirke Corin later today. 07/28/2016 -- was seen by Dr. Kirke Corin who reviewed her arterial studies with an ABI of 0.94 on the right and 0.69 on the left with normal right toe brachial index. Duplex showed diffuse atherosclerosis without discrete stenosis and there was a three-vessel runoff on the right below the knee. Based on these studies and the location of the ulcer he thought it was venous commended she continue with wound care consults. [name], [name] ([id number]) 08/04/16; I note the patient's reasonably functional arterial supply which is fortunate. The wound bed appears to be smaller. Debrided of surface slough. She is been using Iodoflex 08/11/16: Only a very small open area remains. However it has some depth 08/18/16; small open area is smaller. Still open however. 08/25/16; the area has totally closed down and epithelialized. READMISSION 02/09/17; this is an 83 year old woman who is not a diabetic or smoker. She was in our clinic in the summer into mid-September with a wound on her right medial calf that. This eventually closed down. I thought this was mostly venous. She did have evidence of PAD and she was seen by Dr. Kirke Corin. At that point she had an ABI of 0.94 on the right 0.69 on the left with a normal right toe brachial index. It was felt that she had enough blood flow to close her wound and that happened. She was discharged with stockings although I don't get the sense that she really use them she tells me that roughly a week or 2 she developed swelling in her bilateral lower legs and then noted wounds on her right lateral malleolus and left medial calf. The on this she is not really sure how this happened. She is not having a lot of pain  although she is not ambulatory that much only in her home. Other than weight loss she has not noticed any other issues no specific claudication no chest pain. 02/16/17; the patient continues with 2 wounds on the right lateral malleolus and left medial calf. Both of these are painful. She has chronic venous insufficiency but also known PAD and is previously seen Dr. Kirke Corin. We have arranged vascular follow-up with Dr. Kirke Corin to look at the arterial situation 02/25/2017 -- patient was brought in today because she missed her appointment last week with Dr. Leanord Hawking and is having a lot of pain and drainage from her wounds. The interim she was seen by Dr. Bascom Levels who did a duplex of the right lower extremity which shows a right ABI of 0.95 and left ABI 0.74. There was also a abdominal aortogram with a bilateral extremity angiography done for significant peripheral vascular disease. The right lower extremity showed diffuse SFA and popliteal disease with significant stenosis affecting the TP trunk just before the origin of the peritoneal artery. The left lower extremity showed moderate left SFA stenosis with occluded popliteal artery and tibioperoneal vessels with only visible collateral. The opinion was the patient had no endovascular surgical revascularization options of the left lower extremity and Dr. Bascom Levels would discuss it  with Dr. Randie Heinz for a second opinion. Endovascular intervention on the right TP trunk could be considered. 03/03/17; the patient arrives back today having last seen Dr. Meyer Russel. As noted she has severe PAD which is not amenable to either bypass surgery or endovascular interventions on the left. Endovascular interventions in the right TP trunk could be considered. She has large wounds on the right medial leg and a sizable wound on the right lateral leg. Small wound on the left medial leg. She is really in a lot of discomfort. Using Santyl on the right and I believe Prisma on the  left. 03/10/17; the patient arrives today again with large wounds on the right lateral lower extremity, right medial lower extremity. And a small punched out wound on the left medial lower extremity. These are in the setting of severe non-revascularizable PAD. She continues to complain of discomfort although I don't think right now this is unmanageable. We have been using Santyl. We are arranging getting her home health [[facility]] 03/16/17- patient is here for follow-up evaluation of her bilateral lower extremity ulcers. She has an appointment on 4/11 for an arthrectomy per Dr. Kirke Corin at [facility]. She is continuing to [place]. Her son is doing her dressing changes 03/30/17 still using santyl. sincer her last visit she was admitted from 4/11-4/12 for arthrectomy and balloon angioplasty of the right distal popliteal artery with TP trunk and peritoneal arteries. she is on dual antiplatelet and states her pain is somewhat better on the right 04/13/17 still using santyl to all wounds. Area on the right medial and left lateral look better. Still difficulties [name], [name]. ([id number]) with the right lateral wouind. Combination of arterial insufficiency and Venous stasis No revascularization options on the left. Possible interventions on the right (see above) 04/20/17; the patient is still using Santyl to all wound areas. We've had some improvement in the right medial and left lateral. Patient has a combination of arterial and venous insufficiency. No revascularization options are available to the patient on the left however possible interventions on the right. 04/27/17; I continued using Santyl to all wound areas. We've had improvement in the right medial wound and the left lateral wound. The right lateral lower leg wound has remained static. She arrives today with a lot more edema in her bilateral legs this is pitting. She does not complain of chest pain or shortness of breath tells me she has no  prior cardiac issues. 05/12/17; I been using Santyl to all of this lady's wounds. She has an appointment with Dr. Kirke Corin next week. The other issue this week is that apparently her insurance and [facility] are no longer under contract though there was some suggestion that there is another home health company, we can't really determine who that was today. We change the primary dressing to all the wounds to [facility] 05/19/17; patient's appointment with Dr. Kirke Corin is actually next week. We have been using Hydrofera Blue to her wounds 05/26/17 as it turns out Arida's appointment was canceled by his office. It is now in early July. Patient arrives today with a large amount of green drainage coming out of the right lateral leg, she was also discovered by our intake nurse that have an abscess on the left lateral lower leg. We did not previously define a wound in this area. 06/01/17; culture of the abscess on the left lateral leg we did last week showed a rib fairly resistant MRSA. I brought the patient in early today because  of this. I had put her on Doxy and Cipro last week for this and also green drainage coming out of the right lateral wound. Fortunately all the wounds today looks somewhat better. We have been using Aquacel Ag 06/09/17; she is completed antibiotics. We are using Aquacel Ag to all wound areas. She has had closure on the right medial leg and improvement especially on the right lateral leg. 06/25/17 on evaluation today patient appears to be doing well in regard to her bilateral lower extremity wounds. The left lateral ankle wound appears to be healed which is excellent news. Fortunately she has been tolerating the Kerlex in Coban wraps without complication. Electronic Signature(s) Signed: 06/25/2017 6:13:19 PM By: Lenda Kelp PA-C Entered By: Lenda Kelp on 06/25/2017 13:35:39 Pineiro, [name]  ([id number]) -------------------------------------------------------------------------------- Physical Exam Details [name], [name]. Date of Service: 06/25/2017 1:30 PM Medical Record Number: [id number] Patient Account Number: [id number] Date of Birth/Sex: Sep 28, 1931 (83 y.o. Female) Treating RN: Ashok Cordia, Debi Primary Care Provider: Guy Begin Other Clinician: Referring Provider: Guy Begin Treating Provider/Extender: STONE III, HOYT Weeks in Treatment: 19 Constitutional Thin and well-hydrated in no acute distress. Respiratory normal breathing without difficulty. clear to auscultation bilaterally. Cardiovascular regular rate and rhythm with normal S1, S2. Psychiatric this patient is able to make decisions and demonstrates good insight into disease process. Alert and Oriented x 3. pleasant and cooperative. Notes Patient's right lower extremity wounds both appear to have a good granular bed without any evidence of infection or other worsening concerns. Electronic Signature(s) Signed: 06/25/2017 6:13:19 PM By: Lenda Kelp PA-C Entered By: Lenda Kelp on 06/25/2017 13:36:12 Atwood, [name] ([id number]) -------------------------------------------------------------------------------- Physician Orders Details Patient Name: Jordan Banda Date of Service: 06/25/2017 1:30 PM Medical Record Number: [id number] Patient Account Number: [id number] Date of Birth/Sex: 05/09/1931 (83 y.o. 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 o Cleanse wound with mild soap and water Wound #3 Left,Medial Lower Leg o Clean wound with Normal Saline. o Cleanse wound with mild soap and water Anesthetic Wound #2 Right,Lateral Malleolus o Topical Lidocaine 4% cream applied to wound bed prior to debridement - clinic use only Wound #3 Left,Medial Lower Leg o Topical Lidocaine 4% cream applied to wound bed prior to debridement - clinic use only Skin Barriers/Peri-Wound Care Wound #2 Right,Lateral Malleolus o Barrier cream Wound #3 Left,Medial Lower Leg o Barrier cream Primary Wound Dressing Wound #2 Right,Lateral Malleolus o Aquacel Ag Shallen, Luedke Velicia J. ([id number]) Wound #3 Left,Medial Lower Leg o Aquacel Ag Secondary Dressing Wound #2 Right,Lateral Malleolus o Dry Gauze o Foam Wound #3 Left,Medial Lower Leg o Dry Gauze o Foam Dressing Change Frequency Wound #2 Right,Lateral Malleolus o Change dressing every week Wound #3 Left,Medial Lower Leg o Change dressing every week Follow-up Appointments Wound #2 Right,Lateral Malleolus o Return Appointment in 1 week. o Nurse Visit as  needed Wound #3 Left,Medial Lower Leg o Return Appointment in 1 week. o Nurse Visit as needed Edema Control Wound #2 Right,Lateral Malleolus o Kerlix and Coban - Bilateral - wrap lightly o Elevate legs to the level of the heart and pump ankles as often as possible Wound #3 Left,Medial Lower Leg o Kerlix and Coban - Bilateral - wrap lightly o Elevate legs to the level of the heart and pump ankles as often as possible Additional Orders /  Instructions Wound #2 Right,Lateral Malleolus o Increase protein intake. Wound #3 Left,Medial Lower Leg o Increase protein intake. [name], [name] ([id number]) Electronic Signature(s) Signed: 06/25/2017 4:14:11 PM By: [name] Mulling Signed: 06/25/2017 6:13:19 PM By: Lenda Kelp PA-C Entered By: [name] Mulling on 06/25/2017 13:36:53 Ikard, [name] ([id number]) -------------------------------------------------------------------------------- Problem List Details Patient Name: MEENAKSHI, SAZAMA. Date of Service: 06/25/2017 1:30 PM Medical Record Number: [id number] Patient Account Number: [id number] Date of Birth/Sex: [birth year] (83 y.o. Female) Treating RN: Ashok Cordia, Debi Primary Care Provider: Guy Begin Other Clinician: Referring Provider: Guy Begin Treating Provider/Extender: Linwood Dibbles, HOYT Weeks in Treatment: 73 Active Problems ICD-10 Encounter Code Description Active Date Diagnosis I87.333 Chronic venous hypertension (idiopathic) with ulcer and 02/09/2017 Yes inflammation of bilateral lower extremity L97.313 Non-pressure chronic ulcer of right ankle with necrosis of 02/09/2017 Yes muscle L97.221 Non-pressure chronic ulcer of left calf limited to 02/09/2017 Yes breakdown of skin I70.233 Atherosclerosis of native arteries of right leg with 02/25/2017 Yes ulceration of ankle I70.243 Atherosclerosis of native arteries of left leg with ulceration 02/25/2017 Yes of ankle Inactive Problems Resolved Problems Electronic Signature(s) Signed: 06/25/2017 6:13:19 PM By: Lenda Kelp PA-C Entered By: Lenda Kelp on 06/25/2017 13:27:07 Trevor, [name] ([id number]) -------------------------------------------------------------------------------- Progress Note Details Patient Name: Jordan Banda. Date of Service: 06/25/2017 1:30 PM Medical Record Number: [id number] Patient Account Number: [id number] Date of Birth/Sex: 01-07-1931 (83 y.o. Female) Treating RN:  Ashok Cordia, Debi Primary Care Provider: Guy Begin Other Clinician: Referring Provider: Guy Begin Treating Provider/Extender: Linwood Dibbles, HOYT Weeks in Treatment: 19 Subjective Chief Complaint Information obtained from Patient the patient is here for follow-up evaluation of her bilateral lower extremity ulcers History of Present Illness (HPI) 06/10/16; this is an 83 year old woman who lives near [place] with family members. She saw her primary doctor roughly over a week ago and was referred to the ER for a cellulitis in the right medial leg. She was given a round of IV antibiotics in the emergency room and discharged on oral Keflex which she is taking. She is been left with a uncomfortable leg area on the right medial lower leg. Her ABIs in this clinic were 0.8 on the right 0.6 on the left. She does not describe claudication. She is not a diabetic. Lab work in the ER showed a normal BUN and creatinine on 6/20 at 18 and 0.92 respectively white count was 10.1 hemoglobin at 13.8. She did not have any imaging studies. I can see no vascular evaluations in Epic. She does not have a prior wound history 06/17/16; the area affected on her right medial ankle is an area I thought was due to tissue damage from cellulitis last week. She is going for venous reflux studies this afternoon, these were not ordered in this clinic at least not by me. The patient does not give a prior history of damage to the skin in this area although I wonder if she actually might not of noticed it. We have been using's Aquacel Ag under a wrap although we  will not be able to wrap her today 06/24/16: pt reports today she hasn't heard from the vascular clinic regarding proceeding with arterial studies. our office will assist with this today. she denies s/s of infection. denies problems with her wraps. 07/07/16; since I last saw this woman she was admitted to hospital from 7/16 through 7/18 with sepsis syndrome felt to be  secondary to her ulcer. She was treated with Vanco and Zosyn the hospital discharged on Septra. Blood cultures were negative. Urine culture showed multiple species she is now feeling well using Santyl with border foam to her wound. She has [facility] 07/14/16; no major improvement here. She has a quarter-sized wound covered with thick adherent slough on the lateral aspect of her right leg with several surrounding satellite lesions in a similar state. She has had 2 recent bouts of cellulitis, one before she came to this clinic and one required a recent hospitalization. I have asked for arterial studies I don't believe these have ever been done. My notes suggest from early in July that she went for reflux studies although I don't know that I have ever seen these either, she did have a DVT rule out but I did not order this, this did not show a DVT but did show a Baker's cyst 07/21/2016 -- the patient had an arterial duplex study done with waveform suggestive of right femoropopliteal disease. There is also small vessel disease bilaterally. Right ABI was in the normal range of 1.0 and a TBI was 0.74. Left ABI is abnormal at 0.69 with a TBI of 0.40. Three-vessel runoff on the right and a two-vessel runoff on the left. [name], [name] ([id number]) She has an appointment to see Dr. Kirke Corin later today. 07/28/2016 -- was seen by Dr. Kirke Corin who reviewed her arterial studies with an ABI of 0.94 on the right and 0.69 on the left with normal right toe brachial index. Duplex showed diffuse atherosclerosis without discrete stenosis and there was a three-vessel runoff on the right below the knee. Based on these studies and the location of the ulcer he thought it was venous commended she continue with wound care consults. 08/04/16; I note the patient's reasonably functional arterial supply which is fortunate. The wound bed appears to be smaller. Debrided of surface slough. She is been using Iodoflex 08/11/16:  Only a very small open area remains. However it has some depth 08/18/16; small open area is smaller. Still open however. 08/25/16; the area has totally closed down and epithelialized. READMISSION 02/09/17; this is an 83 year old woman who is not a diabetic or smoker. She was in our clinic in the summer into mid-September with a wound on her right medial calf that. This eventually closed down. I thought this was mostly venous. She did have evidence of PAD and she was seen by Dr. Kirke Corin. At that point she had an ABI of 0.94 on the right 0.69 on the left with a normal right toe brachial index. It was felt that she had enough blood flow to close her wound and that happened. She was discharged with stockings although I don't get the sense that she really use them she tells me that roughly a week or 2 she developed swelling in her bilateral lower legs and then noted wounds on her right lateral malleolus and left medial calf. The on this she is not really sure how this happened. She is not having a lot of pain although she is not ambulatory that much only in her  home. Other than weight loss she has not noticed any other issues no specific claudication no chest pain. 02/16/17; the patient continues with 2 wounds on the right lateral malleolus and left medial calf. Both of these are painful. She has chronic venous insufficiency but also known PAD and is previously seen Dr. Kirke Corin. We have arranged vascular follow-up with Dr. Kirke Corin to look at the arterial situation 02/25/2017 -- patient was brought in today because she missed her appointment last week with Dr. Leanord Hawking and is having a lot of pain and drainage from her wounds. The interim she was seen by Dr. Bascom Levels who did a duplex of the right lower extremity which shows a right ABI of 0.95 and left ABI 0.74. There was also a abdominal aortogram with a bilateral extremity angiography done for significant peripheral vascular disease. The right lower extremity showed  diffuse SFA and popliteal disease with significant stenosis affecting the TP trunk just before the origin of the peritoneal artery. The left lower extremity showed moderate left SFA stenosis with occluded popliteal artery and tibioperoneal vessels with only visible collateral. The opinion was the patient had no endovascular surgical revascularization options of the left lower extremity and Dr. Bascom Levels would discuss it with Dr. Randie [facility] for a second opinion. Endovascular intervention on the right TP trunk could be considered. 03/03/17; the patient arrives back today having last seen Dr. Meyer Russel. As noted she has severe PAD which is not amenable to either bypass surgery or endovascular interventions on the left. Endovascular interventions in the right TP trunk could be considered. She has large wounds on the right medial leg and a sizable wound on the right lateral leg. Small wound on the left medial leg. She is really in a lot of discomfort. Using Santyl on the right and I believe Prisma on the left. 03/10/17; the patient arrives today again with large wounds on the right lateral lower extremity, right medial lower extremity. And a small punched out wound on the left medial lower extremity. These are in the setting of severe non-revascularizable PAD. She continues to complain of discomfort although I don't think right now this is unmanageable. We have been using Santyl. We are arranging getting her home health [[facility]] 03/16/17- patient is here for follow-up evaluation of her bilateral lower extremity ulcers. She has an KASSADIE, PANCAKE ([id number]) appointment on 4/11 for an arthrectomy per Dr. Kirke Corin at [facility]. She is continuing to [place]. Her son is doing her dressing changes 03/30/17 still using santyl. sincer her last visit she was admitted from 4/11-4/12 for arthrectomy and balloon angioplasty of the right distal popliteal artery with TP trunk and peritoneal arteries. she is on dual  antiplatelet and states her pain is somewhat better on the right 04/13/17 still using santyl to all wounds. Area on the right medial and left lateral look better. Still difficulties with the right lateral wouind. Combination of arterial insufficiency and Venous stasis No revascularization options on the left. Possible interventions on the right (see above) 04/20/17; the patient is still using Santyl to all wound areas. We've had some improvement in the right medial and left lateral. Patient has a combination of arterial and venous insufficiency. No revascularization options are available to the patient on the left however possible interventions on the right. 04/27/17; I continued using Santyl to all wound areas. We've had improvement in the right medial wound and the left lateral wound. The right lateral lower leg wound has remained static. She arrives today with a  lot more edema in her bilateral legs this is pitting. She does not complain of chest pain or shortness of breath tells me she has no prior cardiac issues. 05/12/17; I been using Santyl to all of this lady's wounds. She has an appointment with Dr. Kirke Corin next week. The other issue this week is that apparently her insurance and [facility] are no longer under contract though there was some suggestion that there is another home health company, we can't really determine who that was today. We change the primary dressing to all the wounds to [facility] 05/19/17; patient's appointment with Dr. Kirke Corin is actually next week. We have been using Hydrofera Blue to her wounds 05/26/17 as it turns out Arida's appointment was canceled by his office. It is now in early July. Patient arrives today with a large amount of green drainage coming out of the right lateral leg, she was also discovered by our intake nurse that have an abscess on the left lateral lower leg. We did not previously define a wound in this area. 06/01/17; culture of the abscess on  the left lateral leg we did last week showed a rib fairly resistant MRSA. I brought the patient in early today because of this. I had put her on Doxy and Cipro last week for this and also green drainage coming out of the right lateral wound. Fortunately all the wounds today looks somewhat better. We have been using Aquacel Ag 06/09/17; she is completed antibiotics. We are using Aquacel Ag to all wound areas. She has had closure on the right medial leg and improvement especially on the right lateral leg. 06/25/17 on evaluation today patient appears to be doing well in regard to her bilateral lower extremity wounds. The left lateral ankle wound appears to be healed which is excellent news. Fortunately she has been tolerating the Kerlex in Coban wraps without complication. Objective Constitutional Thin and well-hydrated in no acute distress. Vitals Time Taken: 1:07 PM, Height: 66 in, Weight: 159 lbs, BMI: 25.7, Temperature: 97.7 F, Pulse: 66 bpm, Respiratory Rate: 16 breaths/min, Blood Pressure: 141/73 mmHg. [name], [name]. ([id number]) Respiratory normal breathing without difficulty. clear to auscultation bilaterally. Cardiovascular regular rate and rhythm with normal S1, S2. Psychiatric this patient is able to make decisions and demonstrates good insight into disease process. Alert and Oriented x 3. pleasant and cooperative. General Notes: Patient's right lower extremity wounds both appear to have a good granular bed without any evidence of infection or other worsening concerns. Integumentary (Hair, Skin) Wound #2 status is Open. Original cause of wound was Gradually Appeared. The wound is located on the Right,Lateral Malleolus. The wound measures 0.3cm length x 0.2cm width x 0.1cm depth; 0.047cm^2 area and 0.005cm^3 volume. There is no tunneling or undermining noted. There is a large amount of serosanguineous drainage noted. There is medium (34-66%) red granulation within the wound bed.  There is a medium (34-66%) amount of necrotic tissue within the wound bed including Adherent Slough. Periwound temperature was noted as No Abnormality. The periwound has tenderness on palpation. Wound #3 status is Open. Original cause of wound was Gradually Appeared. The wound is located on the Left,Medial Lower Leg. The wound measures 0.7cm length x 0.5cm width x 0.1cm depth; 0.275cm^2 area and 0.027cm^3 volume. There is no tunneling or undermining noted. There is a large amount of serous drainage noted. The wound margin is distinct with the outline attached to the wound base. There is medium (34-66%) pink granulation within the wound bed. There  is a medium (34-66%) amount of necrotic tissue within the wound bed including Adherent Slough. Periwound temperature was noted as No Abnormality. The periwound has tenderness on palpation. Wound #6 status is Open. Original cause of wound was Bump. The wound is located on the Left,Lateral Lower Leg. The wound measures 0cm length x 0cm width x 0cm depth; 0cm^2 area and 0cm^3 volume. There is no tunneling or undermining noted. There is a none present amount of drainage noted. There is no granulation within the wound bed. There is no necrotic tissue within the wound bed. Periwound temperature was noted as No Abnormality. The periwound has tenderness on palpation. 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 ([id number]) Plan Wound Cleansing: Wound #2 Right,Lateral Malleolus: Clean wound with Normal Saline. Cleanse wound with  mild soap and water Wound #3 Left,Medial Lower Leg: Clean wound with Normal Saline. Cleanse wound with mild soap and water Anesthetic: Wound #2 Right,Lateral Malleolus: Topical Lidocaine 4% cream applied to wound bed prior to debridement - clinic use only Wound #3 Left,Medial Lower Leg: Topical Lidocaine 4% cream applied to wound bed prior to debridement - clinic use only Skin Barriers/Peri-Wound Care: Wound #2 Right,Lateral Malleolus: Barrier cream Wound #3 Left,Medial Lower Leg: Barrier cream Primary Wound Dressing: Wound #2 Right,Lateral Malleolus: Aquacel Ag Wound #3 Left,Medial Lower Leg: Aquacel Ag Secondary Dressing: Wound #2 Right,Lateral Malleolus: Dry Gauze Foam Wound #3 Left,Medial Lower Leg: Dry Gauze Foam Dressing Change Frequency: Wound #2 Right,Lateral Malleolus: Change dressing every week Wound #3 Left,Medial Lower Leg: Change dressing every week Follow-up Appointments: Wound #2 Right,Lateral Malleolus: Return Appointment in 1 week. Nurse Visit as needed Wound #3 Left,Medial Lower Leg: Return Appointment in 1 week. Nurse Visit as needed Edema Control: LOETTA, CONNELLEY ([id number]) Wound #2 Right,Lateral Malleolus: Kerlix and Coban - Bilateral - wrap lightly Elevate legs to the level of the heart and pump ankles as often as possible Wound #3 Left,Medial Lower Leg: Kerlix and Coban - Bilateral - wrap lightly Elevate legs to the level of the heart and pump ankles as often as possible Additional Orders / Instructions: Wound #2 Right,Lateral Malleolus: Increase protein intake. Wound #3 Left,Medial Lower Leg: Increase protein intake. We will continue with the Current wound care measures per above for the next week. If anything worsens in the interim patient will contact our office for additional recommendations. Otherwise we will see her for reevaluation in one week. Electronic Signature(s) Signed: 06/25/2017 6:13:19 PM By: Lenda Kelp PA-C Entered  By: Lenda Kelp on 06/25/2017 17:19:50 [name], [name] ([id number]) -------------------------------------------------------------------------------- SuperBill Details Patient Name: Jordan Banda Date of Service: 06/25/2017 Medical Record Number: [id number] Patient Account Number: [id number] Date of Birth/Sex: 1931/06/05 (83 y.o. 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 L97.221 Non-pressure chronic ulcer of left calf limited to breakdown of skin I70.233 Atherosclerosis of native arteries of  right leg with ulceration of ankle I70.243 Atherosclerosis of native arteries of left leg with ulceration of ankle Facility Procedures CPT4 Code: [id number] Description: 99214 - WOUND CARE VISIT-LEV 4 EST PT Modifier: Quantity: 1 Physician Procedures CPT4: Description Modifier Quantity Code [id number] 99213 - WC PHYS LEVEL 3 - EST PT 1 ICD-10 Description Diagnosis I87.333 Chronic venous hypertension (idiopathic) with ulcer and inflammation of bilateral lower extremity L97.313 Non-pressure chronic ulcer  of right ankle with necrosis of muscle L97.221 Non-pressure chronic ulcer of left calf limited to breakdown of skin I70.233 Atherosclerosis of native arteries of right leg with ulceration of ankle Electronic Signature(s) Signed: 06/25/2017 4:14:11 PM By: [name] Mulling Signed: 06/25/2017 6:13:19 PM By: Lenda Kelp PA-C Entered By: [name] Mulling on 06/25/2017 14:07:18

## 2017-06-27 NOTE — Progress Notes (Signed)
Jordan, Hopkins ([id number]) Visit Report for 06/25/2017 Arrival Information Details [name], [name]. Date of Service: 06/25/2017 1:30 PM Medical Record Number: [id number] Patient Account Number: [id number] Date of Birth/Sex: 12/23/1930 (83 y.o. Female) Treating RN: Ashok Cordia, Debi Primary Care Hamlin Devine: Guy Begin Other Clinician: Referring Danyah Guastella: Guy Begin Treating Goldy Calandra/Extender: Linwood Dibbles, HOYT Weeks in Treatment: 19 Visit Information History Since Last Visit All ordered tests and consults were completed: No Patient Arrived: Wheel Chair Added or deleted any medications: No Arrival Time: 13:06 Any new allergies or adverse reactions: No Accompanied By: son Had a fall or experienced change in No Transfer Assistance: EasyPivot Patient activities of daily living that may affect Lift risk of falls: Patient Identification Verified: Yes Signs or symptoms of abuse/neglect since last No Secondary Verification Process Yes visito Completed: Hospitalized since last visit: No Patient Requires Transmission- No Has Dressing in Place as Prescribed: Yes Based Precautions: Has Compression in Place as Prescribed: Yes Patient Has Alerts: Yes Pain Present Now: No Patient Alerts: Patient on Blood Thinner Electronic Signature(s) Signed: 06/25/2017 4:14:11 PM By: [name] Entered By: [name] on 06/25/2017 13:06:40 Weipert, [name] ([id number]) -------------------------------------------------------------------------------- Clinic Level of Care Assessment Details Patient Name: Jordan, Hopkins. Date of Service: 06/25/2017 1:30 PM Medical Record Number: [id number] Patient Account Number: [id number] Date of Birth/Sex: 02-05-1931 (83 y.o. Female) Treating RN: Ashok Cordia, Debi Primary Care Metta Koranda: Guy Begin Other Clinician: Referring Perian Tedder: Guy Begin Treating Nickson Middlesworth/Extender: Linwood Dibbles, HOYT Weeks in Treatment: 19 Clinic Level of Care  Assessment Items TOOL 4 Quantity Score X - Use when only an EandM is performed on FOLLOW-UP visit 1 0 ASSESSMENTS - Nursing Assessment / Reassessment X - Reassessment of Co-morbidities (includes updates in patient status) 1 10 X - Reassessment of Adherence to Treatment Plan 1 5 ASSESSMENTS - Wound and Skin Assessment / Reassessment []  - Simple Wound Assessment / Reassessment - one wound 0 X - Complex Wound Assessment / Reassessment - multiple wounds 2 5 []  - Dermatologic / Skin Assessment (not related to wound area) 0 ASSESSMENTS - Focused Assessment []  - Circumferential Edema Measurements - multi extremities 0 []  - Nutritional Assessment / Counseling / Intervention 0 []  - Lower Extremity Assessment (monofilament, tuning fork, pulses) 0 []  - Peripheral Arterial Disease Assessment (using hand held doppler) 0 ASSESSMENTS - Ostomy and/or Continence Assessment and Care []  - Incontinence Assessment and Management 0 []  - Ostomy Care Assessment and Management (repouching, etc.) 0 PROCESS - Coordination of Care []  - Simple Patient / Family Education for ongoing care 0 X - Complex (extensive) Patient / Family Education for ongoing care 1 20 X - Staff obtains Chiropractor, Records, Test Results / Process Orders 1 10 []  - Staff telephones HHA, Nursing Homes / Clarify orders / etc 0 []  - Routine Transfer to another Facility (non-emergent condition) 0 Parrillo, Marche J. ([id number]) []  - Routine Hospital Admission (non-emergent condition) 0 []  - New Admissions / Manufacturing engineer / Ordering NPWT, Apligraf, etc. 0 []  - Emergency Hospital Admission (emergent condition) 0 X - Simple Discharge Coordination 1 10 []  - Complex (extensive) Discharge Coordination 0 PROCESS - Special Needs []  - Pediatric / Minor Patient Management 0 []  - Isolation Patient Management 0 []  - Hearing / Language / Visual special needs 0 []  - Assessment of Community assistance (transportation, D/C planning, etc.) 0 []  -  Additional assistance / Altered mentation 0 []  - Support Surface(s) Assessment (bed, cushion, seat, etc.) 0 INTERVENTIONS - Wound Cleansing / Measurement []  - Simple Wound Cleansing -  one wound 0 X - Complex Wound Cleansing - multiple wounds 2 5 X - Wound Imaging (photographs - any number of wounds) 1 5 []  - Wound Tracing (instead of photographs) 0 []  - Simple Wound Measurement - one wound 0 X - Complex Wound Measurement - multiple wounds 2 5 INTERVENTIONS - Wound Dressings []  - Small Wound Dressing one or multiple wounds 0 []  - Medium Wound Dressing one or multiple wounds 0 X - Large Wound Dressing one or multiple wounds 2 20 X - Application of Medications - topical 1 5 []  - Application of Medications - injection 0 INTERVENTIONS - Miscellaneous []  - External ear exam 0 Matherly, Renelle J. ([id number]) []  - Specimen Collection (cultures, biopsies, blood, body fluids, etc.) 0 []  - Specimen(s) / Culture(s) sent or taken to Lab for analysis 0 []  - Patient Transfer (multiple staff / Michiel Sites Lift / Similar devices) 0 []  - Simple Staple / Suture removal (25 or less) 0 []  - Complex Staple / Suture removal (26 or more) 0 []  - Hypo / Hyperglycemic Management (close monitor of Blood Glucose) 0 []  - Ankle / Brachial Index (ABI) - do not check if billed separately 0 X - Vital Signs 1 5 Has the patient been seen at the hospital within the last three years: Yes Total Score: 140 Level Of Care: New/Established - Level 4 Electronic Signature(s) Signed: 06/25/2017 4:14:11 PM By: [name] Entered By: [name] on 06/25/2017 14:07:10 Manship, [name] ([id number]) -------------------------------------------------------------------------------- Encounter Discharge Information Details [name] Date of Service: 06/25/2017 1:30 PM Medical Record Number: [id number] Patient Account Number: [id number] Date of Birth/Sex: 25-Dec-1930 (83 y.o. Female) Treating RN: Ashok Cordia,  Debi Primary Care Troy Hartzog: Guy Begin Other Clinician: Referring Yenny Kosa: Guy Begin Treating Claudean Leavelle/Extender: Linwood Dibbles, HOYT Weeks in Treatment: 69 Encounter Discharge Information Items Discharge Pain Level: 0 Discharge Condition: Stable Ambulatory Status: Wheelchair Discharge Destination: Home Transportation: Private Auto Accompanied By: son Schedule Follow-up Appointment: Yes Medication Reconciliation completed and provided to Patient/Care No Fritzie Prioleau: Provided on Clinical Summary of Care: 06/25/2017 Form Type Recipient Paper Patient CF Electronic Signature(s) Signed: 06/25/2017 1:45:40 PM By: Gwenlyn Perking Entered By: Gwenlyn Perking on 06/25/2017 13:45:40 Pepitone, [name] ([id number]) -------------------------------------------------------------------------------- Lower Extremity Assessment Details [name] Date of Service: 06/25/2017 1:30 PM Medical Record Number: [id number] Patient Account Number: [id number] Date of Birth/Sex: Jun 11, 1931 (83 y.o. Female) Treating RN: Ashok Cordia, Debi Primary Care Aking Klabunde: Guy Begin Other Clinician: Referring Evanie Buckle: Guy Begin Treating Twylah Bennetts/Extender: Linwood Dibbles, HOYT Weeks in Treatment: 7 Vascular Assessment Pulses: Dorsalis Pedis Palpable: [Left:Yes] [Right:Yes] Posterior Tibial Extremity colors, hair growth, and conditions: Extremity Color: [Left:Hyperpigmented] [Right:Hyperpigmented] Temperature of Extremity: [Left:Warm] [Right:Warm] Capillary Refill: [Left:> 3 seconds] [Right:> 3 seconds] Toe Nail Assessment Left: Right: Thick: Yes Yes Discolored: Yes Yes Deformed: No No Improper Length and Hygiene: Yes Yes Electronic Signature(s) Signed: 06/25/2017 4:14:11 PM By: [name] Entered By: [name] on 06/25/2017 13:29:07 Schoenfeld, [name] ([id number]) -------------------------------------------------------------------------------- Multi Wound Chart Details Patient  Name: [name]. Date of Service: 06/25/2017 1:30 PM Medical Record Number: [id number] Patient Account Number: [id number] Date of Birth/Sex: [birth year] (83 y.o. Female) Treating RN: Ashok Cordia, Debi Primary Care Konner Saiz: Guy Begin Other Clinician: Referring Weylyn Ricciuti: Guy Begin Treating Rhyan Radler/Extender: STONE III, HOYT Weeks in Treatment: 19 Vital Signs Height(in): 66 Pulse(bpm): 66 Weight(lbs): 159 Blood Pressure 141/73 (mmHg): Body Mass Index(BMI): 26 Temperature(F): 97.7 Respiratory Rate 16 (breaths/min): Photos: [2:No Photos] [3:No Photos] [N/A:N/A] Wound Location: [2:Right Malleolus - Lateral] [3:Left Lower Leg -  Medial] [N/A:N/A] Wounding Event: [2:Gradually Appeared] [3:Gradually Appeared] [N/A:N/A] Primary Etiology: [2:Venous Leg Ulcer] [3:Venous Leg Ulcer] [N/A:N/A] Comorbid History: [2:Cataracts, Arrhythmia, Hypertension, Osteoarthritis] [3:Cataracts, Arrhythmia, Hypertension, Osteoarthritis] [N/A:N/A] Date Acquired: [2:01/19/2017] [3:01/19/2017] [N/A:N/A] Weeks of Treatment: [2:19] [3:19] [N/A:N/A] Wound Status: [2:Open] [3:Open] [N/A:N/A] Measurements L x W x D 0.3x0.2x0.1 [3:0.7x0.5x0.1] [N/A:N/A] (cm) Area (cm) : [2:0.047] [3:0.275] [N/A:N/A] Volume (cm) : [2:0.005] [3:0.027] [N/A:N/A] % Reduction in Area: [2:98.90%] [3:30.00%] [N/A:N/A] % Reduction in Volume: 98.80% [3:30.80%] [N/A:N/A] Classification: [2:Full Thickness Without Exposed Support Structures] [3:Full Thickness Without Exposed Support Structures] [N/A:N/A] Exudate Amount: [2:Large] [3:Large] [N/A:N/A] Exudate Type: [2:Serosanguineous] [3:Serous] [N/A:N/A] Exudate Color: [2:red, brown] [3:amber] [N/A:N/A] Wound Margin: [2:N/A] [3:Distinct, outline attached] [N/A:N/A] Granulation Amount: [2:Medium (34-66%)] [3:Medium (34-66%)] [N/A:N/A] Granulation Quality: [2:Red] [3:Pink] [N/A:N/A] Necrotic Amount: [2:Medium (34-66%)] [3:Medium (34-66%)] [N/A:N/A] Epithelialization: [2:Large  (67-100%)] [3:Large (67-100%)] [N/A:N/A] Periwound Skin Texture: No Abnormalities Noted [3:No Abnormalities Noted] [N/A:N/A] Periwound Skin [2:No Abnormalities Noted] [3:No Abnormalities Noted] [N/A:N/A] Moisture: Walczyk, Paulina J. ([id number]) Periwound Skin Color: No Abnormalities Noted No Abnormalities Noted N/A Temperature: No Abnormality No Abnormality N/A Tenderness on Yes Yes N/A Palpation: Wound Preparation: Ulcer Cleansing: Ulcer Cleansing: N/A Rinsed/Irrigated with Rinsed/Irrigated with Saline Saline Topical Anesthetic Topical Anesthetic Applied: Other: lidocaine Applied: Other: lidocaine 4% 4% Treatment Notes Electronic Signature(s) Signed: 06/25/2017 4:14:11 PM By: [name] Entered By: [name] on 06/25/2017 13:29:25 ANGELITA, HARNACK ([id number]) -------------------------------------------------------------------------------- Multi-Disciplinary Care Plan Details Patient Name: MADELYNN, MALSON. Date of Service: 06/25/2017 1:30 PM Medical Record Number: [id number] Patient Account Number: [id number] Date of Birth/Sex: 16-Feb-1931 (83 y.o. Female) Treating RN: Ashok Cordia, Debi Primary Care Fianna Snowball: Guy Begin Other Clinician: Referring Christon Gallaway: Guy Begin Treating Roosevelt Bisher/Extender: Linwood Dibbles, HOYT Weeks in Treatment: 19 Active Inactive ` Abuse / Safety / Falls / Self Care Management Nursing Diagnoses: Potential for falls Goals: Patient will remain injury free Date Initiated: 02/09/2017 Target Resolution Date: 04/17/2017 Goal Status: Active Interventions: Assess fall risk on admission and as needed Assess self care needs on admission and as needed Notes: ` Nutrition Nursing Diagnoses: Imbalanced nutrition Goals: Patient/caregiver agrees to and verbalizes understanding of need to use nutritional supplements and/or vitamins as prescribed Date Initiated: 02/09/2017 Target Resolution Date: 04/17/2017 Goal Status: Active Interventions: Assess  patient nutrition upon admission and as needed per policy Notes: ` Orientation to the Wound Care Program Nursing Diagnoses: ARSHIA, SPELLMAN ([id number]) Knowledge deficit related to the wound healing center program Goals: Patient/caregiver will verbalize understanding of the Wound Healing Center Program Date Initiated: 02/09/2017 Target Resolution Date: 02/20/2017 Goal Status: Active Interventions: Provide education on orientation to the wound center Notes: ` Pain, Acute or Chronic Nursing Diagnoses: Pain, acute or chronic: actual or potential Potential alteration in comfort, pain Goals: Patient/caregiver will verbalize adequate pain control between visits Date Initiated: 02/09/2017 Target Resolution Date: 04/17/2017 Goal Status: Active Interventions: Assess comfort goal upon admission Complete pain assessment as per visit requirements Notes: ` Wound/Skin Impairment Nursing Diagnoses: Impaired tissue integrity Knowledge deficit related to smoking impact on wound healing Knowledge deficit related to ulceration/compromised skin integrity Goals: Ulcer/skin breakdown will have a volume reduction of 80% by week 12 Date Initiated: 02/09/2017 Target Resolution Date: 04/10/2017 Goal Status: Active Interventions: Assess patient/caregiver ability to perform ulcer/skin care regimen upon admission and as needed Assess ulceration(s) every visit CALIYA, NARINE ([id number]) Notes: Electronic Signature(s) Signed: 06/25/2017 4:14:11 PM By: [name] Entered By: [name] on 06/25/2017 13:29:14 Wilms, [name] ([id number]) -------------------------------------------------------------------------------- Pain Assessment Details [name] Date of Service: 06/25/2017 1:30 PM  Medical Record Number: [id number] Patient Account Number: [id number] Date of Birth/Sex: [birth year] (83 y.o. Female) Treating RN: Ashok Cordia, Debi Primary Care Marrissa Dai: Guy Begin Other Clinician: Referring Vietta Bonifield: Guy Begin Treating Arihana Ambrocio/Extender: Linwood Dibbles, HOYT Weeks in Treatment: 19 Active Problems Location of Pain Severity and Description of Pain Patient Has Paino No Site Locations With Dressing Change: No Pain Management and Medication Current Pain Management: Electronic Signature(s) Signed: 06/25/2017 4:14:11 PM By: [name] Entered By: [name] on 06/25/2017 13:07:55 Anspaugh, [name] ([id number]) -------------------------------------------------------------------------------- Patient/Caregiver Education Details [name] Date of Service: 06/25/2017 1:30 PM Medical Record Number: [id number] Patient Account Number: [id number] Date of Birth/Gender: 01/23/1931 (83 y.o. Female) Treating RN: Ashok Cordia, Debi Primary Care Physician: Guy Begin Other Clinician: Referring Physician: Guy Begin Treating Physician/Extender: Skeet Simmer in Treatment: 50 Education Assessment Education Provided To: Patient Education Topics Provided Wound/Skin Impairment: Handouts: Other: change dressing as ordered Methods: Demonstration, Explain/Verbal Responses: State content correctly Electronic Signature(s) Signed: 06/25/2017 4:14:11 PM By: [name] Entered By: [name] on 06/25/2017 13:30:16 Cerone, [name] ([id number]) -------------------------------------------------------------------------------- Wound Assessment Details [name] Date of Service: 06/25/2017 1:30 PM Medical Record Number: [id number] Patient Account Number: [id number] Date of Birth/Sex: 1931-04-24 (83 y.o. Female) Treating RN: Ashok Cordia, Debi Primary Care Shasta Chinn: Guy Begin Other Clinician: Referring Maraya Gwilliam: Guy Begin Treating Ahrianna Siglin/Extender: Linwood Dibbles, HOYT Weeks in Treatment: 19 Wound Status Wound Number: 2 Primary Venous Leg Ulcer Etiology: Wound Location: Right  Malleolus - Lateral Wound Status: Open Wounding Event: Gradually Appeared Comorbid Cataracts, Arrhythmia, Hypertension, Date Acquired: 01/19/2017 History: Osteoarthritis Weeks Of Treatment: 19 Clustered Wound: No Photos Photo Uploaded By: [name] on 06/25/2017 14:11:35 Wound Measurements Length: (cm) 0.3 Width: (cm) 0.2 Depth: (cm) 0.1 Area: (cm) 0.047 Volume: (cm) 0.005 % Reduction in Area: 98.9% % Reduction in Volume: 98.8% Epithelialization: Large (67-100%) Tunneling: No Undermining: No Wound Description Full Thickness Without Exposed Foul Odor Aft Classification: Support Structures Slough/Fibrin Exudate Large Amount: Exudate Type: Serosanguineous Exudate Color: red, brown er Cleansing: No o Yes Wound Bed Granulation Amount: Medium (34-66%) Granulation Quality: Red Necrotic Amount: Medium (34-66%) Necrotic Quality: Adherent [address], [place] J. ([id number]) Periwound Skin Texture Texture Color No Abnormalities Noted: No No Abnormalities Noted: No Moisture Temperature / Pain No Abnormalities Noted: No Temperature: No Abnormality Tenderness on Palpation: Yes Wound Preparation Ulcer Cleansing: Rinsed/Irrigated with Saline Topical Anesthetic Applied: Other: lidocaine 4%, Treatment Notes Wound #2 (Right, Lateral Malleolus) 1. Cleansed with: Clean wound with Normal Saline Cleanse wound with antibacterial soap and water 2. Anesthetic Topical Lidocaine 4% cream to wound bed prior to debridement 4. Dressing Applied: Aquacel Ag 5. Secondary Dressing Applied Dry Gauze Foam 7. Secured with Tape Notes kerlix, coban Electronic Signature(s) Signed: 06/25/2017 4:14:11 PM By: [name] Entered By: [name] on 06/25/2017 13:21:48 Quesada, [name] ([id number]) -------------------------------------------------------------------------------- Wound Assessment Details [name] Date of Service: 06/25/2017 1:30  PM Medical Record Number: [id number] Patient Account Number: [id number] Date of Birth/Sex: Oct 19, 1931 (83 y.o. Female) Treating RN: Ashok Cordia, Debi Primary Care Thomos Domine: Guy Begin Other Clinician: Referring Madeeha Costantino: Guy Begin Treating Duval Macleod/Extender: STONE III, HOYT Weeks in Treatment: 19 Wound Status Wound Number: 3 Primary Venous Leg Ulcer Etiology: Wound Location: Left Lower Leg - Medial Wound Status: Open Wounding Event: Gradually Appeared Comorbid Cataracts, Arrhythmia, Hypertension, Date Acquired: 01/19/2017 History: Osteoarthritis Weeks Of Treatment: 19 Clustered Wound: No Photos Photo Uploaded By: [name] on 06/25/2017 14:11:59 Wound Measurements Length: (cm) 0.7 Width: (cm) 0.5 Depth: (cm) 0.1  Area: (cm) 0.275 Volume: (cm) 0.027 % Reduction in Area: 30% % Reduction in Volume: 30.8% Epithelialization: Large (67-100%) Tunneling: No Undermining: No Wound Description Full Thickness Without Exposed Classification: Support Structures Wound Margin: Distinct, outline attached Exudate Large Amount: Exudate Type: Serous Exudate Color: amber Foul Odor After Cleansing: No Slough/Fibrino Yes Wound Bed Granulation Amount: Medium (34-66%) Granulation Quality: Pink Necrotic Amount: Medium (34-66%) Bonebrake, Isyss J. ([id number]) Necrotic Quality: Adherent Slough Periwound Skin Texture Texture Color No Abnormalities Noted: No No Abnormalities Noted: No Moisture Temperature / Pain No Abnormalities Noted: No Temperature: No Abnormality Tenderness on Palpation: Yes Wound Preparation Ulcer Cleansing: Rinsed/Irrigated with Saline Topical Anesthetic Applied: Other: lidocaine 4%, Treatment Notes Wound #3 (Left, Medial Lower Leg) 1. Cleansed with: Clean wound with Normal Saline Cleanse wound with antibacterial soap and water 2. Anesthetic Topical Lidocaine 4% cream to wound bed prior to debridement 4. Dressing Applied: Aquacel Ag 5.  Secondary Dressing Applied Dry Gauze Foam 7. Secured with Tape Notes kerlix, coban Electronic Signature(s) Signed: 06/25/2017 4:14:11 PM By: Alejandro MullingPinkerton, Debra Entered By: Alejandro MullingPinkerton, Debra on 06/25/2017 13:22:40 Virag, [name] ([id number]) -------------------------------------------------------------------------------- Wound Assessment Details Patient Name: Jordan BandaFULLER, Mykelle J. Date of Service: 06/25/2017 1:30 PM Medical Record Number: [id number] Patient Account Number: [id number] Date of Birth/Sex: 1931-05-10 (83 y.o. Female) Treating RN: Ashok CordiaPinkerton, Debi Primary Care Kyrielle Urbanski: [name], NARGES Other Clinician: Referring Chinonso Linker: [name], NARGES Treating Semaj Coburn/Extender: STONE III, HOYT Weeks in Treatment: 19 Wound Status Wound Number: 6 Primary Abscess Etiology: Wound Location: Left Lower Leg - Lateral Wound Status: Open Wounding Event: Bump Comorbid Cataracts, Arrhythmia, Hypertension, Date Acquired: 05/26/2017 History: Osteoarthritis Weeks Of Treatment: 4 Clustered Wound: No Photos Photo Uploaded By: Alejandro MullingPinkerton, Debra on 06/25/2017 14:12:45 Wound Measurements Length: (cm) 0 % Reduction i Width: (cm) 0 % Reduction i Depth: (cm) 0 Epithelializa Area: (cm) 0 Tunneling: Volume: (cm) 0 Undermining: n Area: 100% n Volume: 100% tion: Large (67-100%) No No Wound Description Full Thickness Without Exposed Foul Odor Aft Classification: Support Structures Slough/Fibrin Exudate None Present Amount: er Cleansing: No o No Wound Bed Granulation Amount: None Present (0%) Necrotic Amount: None Present (0%) Periwound Skin Texture Texture Color No Abnormalities Noted: No No Abnormalities Noted: No Linnemann, Stori J. ([id number]) Moisture Temperature / Pain No Abnormalities Noted: No Temperature: No Abnormality Tenderness on Palpation: Yes Wound Preparation Ulcer Cleansing: Rinsed/Irrigated with Saline Topical Anesthetic Applied: None Electronic Signature(s) Signed:  06/25/2017 4:14:11 PM By: Alejandro MullingPinkerton, Debra Entered By: Alejandro MullingPinkerton, Debra on 06/25/2017 13:31:27 Sturgeon, [name] ([id number]) -------------------------------------------------------------------------------- Vitals Details Patient Name: Jordan BandaFULLER, Lanetta J. Date of Service: 06/25/2017 1:30 PM Medical Record Number: [id number] Patient Account Number: [id number] Date of Birth/Sex: 1931-05-10 (83 y.o. Female) Treating RN: Ashok CordiaPinkerton, Debi Primary Care Rakiyah Esch: [name], NARGES Other Clinician: Referring Shamond Skelton: [name], NARGES Treating Tinslee Klare/Extender: Linwood DibblesSTONE III, HOYT Weeks in Treatment: 19 Vital Signs Time Taken: 13:07 Temperature (F): 97.7 Height (in): 66 Pulse (bpm): 66 Weight (lbs): 159 Respiratory Rate (breaths/min): 16 Body Mass Index (BMI): 25.7 Blood Pressure (mmHg): 141/73 Reference Range: 80 - 120 mg / dl Electronic Signature(s) Signed: 06/25/2017 4:14:11 PM By: Alejandro MullingPinkerton, Debra Entered By: Alejandro MullingPinkerton, Debra on 06/25/2017 13:08:21

## 2017-07-02 ENCOUNTER — Ambulatory Visit: Payer: Medicare Other | Admitting: Surgery

## 2017-07-06 ENCOUNTER — Encounter: Payer: Medicare Other | Admitting: Internal Medicine

## 2017-07-06 DIAGNOSIS — I87333 Chronic venous hypertension (idiopathic) with ulcer and inflammation of bilateral lower extremity: Secondary | ICD-10-CM | POA: Diagnosis not present

## 2017-07-07 NOTE — Progress Notes (Signed)
[name], [name] ([id number]) Visit Report for 07/06/2017 HPI Details [name], [name]. Date of Service: 07/06/2017 1:30 PM Medical Record Patient Account Number: [id number] Number: Treating RN: Huel Coventry Apr 26, 1931 (83 y.o. Other Clinician: Date of Birth/Sex: Female) Treating Ellie Spickler Primary Care Provider: Guy Begin Provider/Extender: G Referring Provider: Guy Begin Weeks in Treatment: 21 History of Present Illness HPI Description: 06/10/16; this is an 83 year old woman who lives near [place] with family members. She saw her primary doctor roughly over a week ago and was referred to the ER for a cellulitis in the right medial leg. She was given a round of IV antibiotics in the emergency room and discharged on oral Keflex which she is taking. She is been left with a uncomfortable leg area on the right medial lower leg. Her ABIs in this clinic were 0.8 on the right 0.6 on the left. She does not describe claudication. She is not a diabetic. Lab work in the ER showed a normal BUN and creatinine on 6/20 at 18 and 0.92 respectively white count was 10.1 hemoglobin at 13.8. She did not have any imaging studies. I can see no vascular evaluations in Epic. She does not have a prior wound history 06/17/16; the area affected on her right medial ankle is an area I thought was due to tissue damage from cellulitis last week. She is going for venous reflux studies this afternoon, these were not ordered in this clinic at least not by me. The patient does not give a prior history of damage to the skin in this area although I wonder if she actually might not of noticed it. We have been using's Aquacel Ag under a wrap although we will not be able to wrap her today 06/24/16: pt reports today she hasn't heard from the vascular clinic regarding proceeding with arterial studies. our office will assist with this today. she denies s/s of infection. denies problems with her  wraps. 07/07/16; since I last saw this woman she was admitted to hospital from 7/16 through 7/18 with sepsis syndrome felt to be secondary to her ulcer. She was treated with Vanco and Zosyn the hospital discharged on Septra. Blood cultures were negative. Urine culture showed multiple species she is now feeling well using Santyl with border foam to her wound. [facility] 07/14/16; no major improvement here. She has a quarter-sized wound covered with thick adherent slough on the lateral aspect of her right leg with several surrounding satellite lesions in a similar state. She has had 2 recent bouts of cellulitis, one before she came to this clinic and one required a recent hospitalization. I have asked for arterial studies I don't believe these have ever been done. My notes suggest from early in July that she went for reflux studies although I don't know that I have ever seen these either, she did have a DVT rule out but I did not order this, this did not show a DVT but did show a Baker's cyst 07/21/2016 -- the patient had an arterial duplex study done with waveform suggestive of right femoropopliteal disease. There is also small vessel disease bilaterally. Right ABI was in the normal range of 1.0 and a TBI was 0.74. Left ABI is abnormal at 0.69 with a TBI of 0.40. Three-vessel runoff on the right and a two-vessel runoff on the left. She has an appointment to see Dr. Kirke Corin later today. [name], [name] ([id number]) 07/28/2016 -- was seen by Dr. Kirke Corin who reviewed  her arterial studies with an ABI of 0.94 on the right and 0.69 on the left with normal right toe brachial index. Duplex showed diffuse atherosclerosis without discrete stenosis and there was a three-vessel runoff on the right below the knee. Based on these studies and the location of the ulcer he thought it was venous commended she continue with wound care consults. 08/04/16; I note the patient's reasonably functional arterial  supply which is fortunate. The wound bed appears to be smaller. Debrided of surface slough. She is been using Iodoflex 08/11/16: Only a very small open area remains. However it has some depth 08/18/16; small open area is smaller. Still open however. 08/25/16; the area has totally closed down and epithelialized. READMISSION 02/09/17; this is an 83 year old woman who is not a diabetic or smoker. She was in our clinic in the summer into mid-September with a wound on her right medial calf that. This eventually closed down. I thought this was mostly venous. She did have evidence of PAD and she was seen by Dr. Kirke Corin. At that point she had an ABI of 0.94 on the right 0.69 on the left with a normal right toe brachial index. It was felt that she had enough blood flow to close her wound and that happened. She was discharged with stockings although I don't get the sense that she really use them she tells me that roughly a week or 2 she developed swelling in her bilateral lower legs and then noted wounds on her right lateral malleolus and left medial calf. The on this she is not really sure how this happened. She is not having a lot of pain although she is not ambulatory that much only in her home. Other than weight loss she has not noticed any other issues no specific claudication no chest pain. 02/16/17; the patient continues with 2 wounds on the right lateral malleolus and left medial calf. Both of these are painful. She has chronic venous insufficiency but also known PAD and is previously seen Dr. Kirke Corin. We have arranged vascular follow-up with Dr. Kirke Corin to look at the arterial situation 02/25/2017 -- patient was brought in today because she missed her appointment last week with Dr. Leanord Hawking and is having a lot of pain and drainage from her wounds. The interim she was seen by Dr. Bascom Levels who did a duplex of the right lower extremity which shows a right ABI of 0.95 and left ABI 0.74. There was also a abdominal  aortogram with a bilateral extremity angiography done for significant peripheral vascular disease. The right lower extremity showed diffuse SFA and popliteal disease with significant stenosis affecting the TP trunk just before the origin of the peritoneal artery. The left lower extremity showed moderate left SFA stenosis with occluded popliteal artery and tibioperoneal vessels with only visible collateral. The opinion was the patient had no endovascular surgical revascularization options of the left lower extremity and Dr. Bascom Levels would discuss it with Dr. Randie Heinz for a second opinion. Endovascular intervention on the right TP trunk could be considered. 03/03/17; the patient arrives back today having last seen Dr. Meyer Russel. As noted she has severe PAD which is not amenable to either bypass surgery or endovascular interventions on the left. Endovascular interventions in the right TP trunk could be considered. She has large wounds on the right medial leg and a sizable wound on the right lateral leg. Small wound on the left medial leg. She is really in a lot of discomfort. Using Santyl on the right and  I believe Prisma on the left. 03/10/17; the patient arrives today again with large wounds on the right lateral lower extremity, right medial lower extremity. And a small punched out wound on the left medial lower extremity. These are in the setting of severe non-revascularizable PAD. She continues to complain of discomfort although I don't think right now this is unmanageable. We have been using Santyl. We are arranging getting her home health [[facility]] 03/16/17- patient is here for follow-up evaluation of her bilateral lower extremity ulcers. She has an appointment on 4/11 for an arthrectomy per Dr. Kirke Corin at [facility]. She is continuing to [place]. Her son DASHAWNA, DELBRIDGE ([id number]) is doing her dressing changes 03/30/17 still using santyl. sincer her last visit she was admitted from 4/11-4/12 for  arthrectomy and balloon angioplasty of the right distal popliteal artery with TP trunk and peritoneal arteries. she is on dual antiplatelet and states her pain is somewhat better on the right 04/13/17 still using santyl to all wounds. Area on the right medial and left lateral look better. Still difficulties with the right lateral wouind. Combination of arterial insufficiency and Venous stasis No revascularization options on the left. Possible interventions on the right (see above) 04/20/17; the patient is still using Santyl to all wound areas. We've had some improvement in the right medial and left lateral. Patient has a combination of arterial and venous insufficiency. No revascularization options are available to the patient on the left however possible interventions on the right. 04/27/17; I continued using Santyl to all wound areas. We've had improvement in the right medial wound and the left lateral wound. The right lateral lower leg wound has remained static. She arrives today with a lot more edema in her bilateral legs this is pitting. She does not complain of chest pain or shortness of breath tells me she has no prior cardiac issues. 05/12/17; I been using Santyl to all of this lady's wounds. She has an appointment with Dr. Kirke Corin next week. The other issue this week is that apparently her insurance and [facility] are no longer under contract though there was some suggestion that there is another home health company, we can't really determine who that was today. We change the primary dressing to all the wounds to [facility] 05/19/17; patient's appointment with Dr. Kirke Corin is actually next week. We have been using Hydrofera Blue to her wounds 05/26/17 as it turns out Arida's appointment was canceled by his office. It is now in early July. Patient arrives today with a large amount of green drainage coming out of the right lateral leg, she was also discovered by our intake nurse that have an  abscess on the left lateral lower leg. We did not previously define a wound in this area. 06/01/17; culture of the abscess on the left lateral leg we did last week showed a rib fairly resistant MRSA. I brought the patient in early today because of this. I had put her on Doxy and Cipro last week for this and also green drainage coming out of the right lateral wound. Fortunately all the wounds today looks somewhat better. We have been using Aquacel Ag 06/09/17; she is completed antibiotics. We are using Aquacel Ag to all wound areas. She has had closure on the right medial leg and improvement especially on the right lateral leg. 06/25/17 on evaluation today patient appears to be doing well in regard to her bilateral lower extremity wounds. The left lateral ankle wound appears to be healed  which is excellent news. Fortunately she has been tolerating the Kerlex in [place]an wraps without complication. 07/06/17; the patient's wounds have healed bilaterally. The left lateral calf/ankle wound has healed. She has been in Kerlix and Coban wraps. She tells me she does not have significant edema in her legs historically although she does have severely damaged skin bilaterally from chronic venous hypertension and inflammation Electronic Signature(s) Signed: 07/06/2017 5:23:04 PM By: Baltazar Najjar MD Entered By: Baltazar Najjar on 07/06/2017 14:38:41 [name], [name] ([id number]) -------------------------------------------------------------------------------- Physical Exam Details [name] Date of Service: 07/06/2017 1:30 PM Medical Record Patient Account Number: [id number] Number: Treating RN: Huel Coventry 10/05/1931 (83 y.o. Other Clinician: Date of Birth/Sex: Female) Treating Azzan Butler Primary Care Provider: Guy Begin Provider/Extender: G Referring Provider: Guy Begin Weeks in Treatment: 21 Constitutional Sitting or standing Blood Pressure is within target  range for patient.. Pulse regular and within target range for patient.Marland Kitchen Respirations regular, non-labored and within target range.. Temperature is normal and within the target range for the patient.Marland Kitchen appears in no distress. Notes Wound exam; patient's right lower extremity wounds have completely healed over and are well epithelialized. oArea on the left medial lower calf area also looks fully epithelialized after careful inspection Electronic Signature(s) Signed: 07/06/2017 5:23:04 PM By: Baltazar Najjar MD Entered By: Baltazar Najjar on 07/06/2017 14:39:45 [name], [name] ([id number]) -------------------------------------------------------------------------------- Physician Orders Details [name] Date of Service: 07/06/2017 1:30 PM Medical Record Patient Account Number: [id number] Number: Treating RN: Huel Coventry 02/27/1931 (83 y.o. Other Clinician: Date of Birth/Sex: Female) Treating Ayliana Casciano Primary Care Provider: Guy Begin Provider/Extender: G Referring Provider: [name] in Treatment: 43 Verbal / Phone Orders: No Diagnosis Coding Edema Control o Patient to wear own compression stockings Discharge From [facility] Services o Discharge from Wound Care Center - treatment complete Electronic Signature(s) Signed: 07/06/2017 2:27:38 PM By: Elliot Gurney, BSN, RN, CWS, Kim RN, BSN Signed: 07/06/2017 5:23:04 PM By: Baltazar Najjar MD Entered By: Elliot Gurney, BSN, RN, CWS, Kim on 07/06/2017 14:14:30 [name], [name] ([id number]) -------------------------------------------------------------------------------- Problem List Details [name], [name]. Date of Service: 07/06/2017 1:30 PM Medical Record Patient Account Number: [id number] Number: Treating RN: Huel Coventry [birth year] (83 y.o. 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 L97.221 Non-pressure chronic ulcer of left calf limited to 02/09/2017 Yes breakdown of skin I70.233 Atherosclerosis of native arteries of right leg with 02/25/2017 Yes ulceration of ankle I70.243 Atherosclerosis of native arteries of left leg with ulceration 02/25/2017 Yes of ankle Inactive Problems Resolved Problems Electronic Signature(s) Signed: 07/06/2017 5:23:04 PM By: Baltazar Najjar MD Entered By: Baltazar Najjar on 07/06/2017 14:36:47 [name], [name] ([id number]) -------------------------------------------------------------------------------- Progress Note Details [name] Date of Service: 07/06/2017 1:30 PM Medical Record Patient Account Number: [id number] Number: Treating RN: Huel Coventry [birth year] (83 y.o. Other Clinician: Date of Birth/Sex: Female) Treating Elyshia Kumagai Primary Care Provider: Guy Begin Provider/Extender: G Referring Provider: Guy Begin Weeks in Treatment: 21 Subjective History of Present Illness (HPI) 06/10/16; this is an 83 year old woman who lives near [place] with family members. She saw her primary doctor roughly over a week ago and was referred to  the ER for a cellulitis in the right medial leg. She was given a round of IV antibiotics in the emergency room and discharged on oral Keflex which she is taking. She is been left with a uncomfortable leg area on the right medial lower leg. Her ABIs in this clinic were 0.8 on the right 0.6 on the left. She does not describe claudication. She is not a diabetic. Lab work in the ER showed a normal BUN and creatinine on 6/20 at 18 and 0.92  respectively white count was 10.1 hemoglobin at 13.8. She did not have any imaging studies. I can see no vascular evaluations in Epic. She does not have a prior wound history 06/17/16; the area affected on her right medial ankle is an area I thought was due to tissue damage from cellulitis last week. She is going for venous reflux studies this afternoon, these were not ordered in this clinic at least not by me. The patient does not give a prior history of damage to the skin in this area although I wonder if she actually might not of noticed it. We have been using's Aquacel Ag under a wrap although we will not be able to wrap her today 06/24/16: pt reports today she hasn't heard from the vascular clinic regarding proceeding with arterial studies. our office will assist with this today. she denies s/s of infection. denies problems with her wraps. 07/07/16; since I last saw this woman she was admitted to hospital from 7/16 through 7/18 with sepsis syndrome felt to be secondary to her ulcer. She was treated with Vanco and Zosyn the hospital discharged on Septra. Blood cultures were negative. Urine culture showed multiple species she is now feeling well using Santyl with border foam to her wound. [facility] has [facility] 07/14/16; no major improvement here. She has a quarter-sized wound covered with thick adherent slough on the lateral aspect of her right leg with several surrounding satellite lesions in a similar state. She has had 2 recent bouts of cellulitis, one before she came to this clinic and one required a recent hospitalization. I have asked for arterial studies I don't believe these have ever been done. My notes suggest from early in July that she went for reflux studies although I don't know that I have ever seen these either, she did have a DVT rule out but I did not order this, this did not show a DVT but did show a Baker's cyst 07/21/2016 -- the patient had an arterial duplex study done  with waveform suggestive of right femoropopliteal disease. There is also small vessel disease bilaterally. Right ABI was in the normal range of 1.0 and a TBI was 0.74. Left ABI is abnormal at 0.69 with a TBI of 0.40. Three-vessel runoff on the right and a two-vessel runoff on the left. She has an appointment to see Dr. Kirke CorinArida later today. 07/28/2016 -- was seen by Dr. Kirke CorinArida who reviewed her arterial studies with an ABI of 0.94 on the right and [name], Jordan J. ([id number]) 0.69 on the left with normal right toe brachial index. Duplex showed diffuse atherosclerosis without discrete stenosis and there was a three-vessel runoff on the right below the knee. Based on these studies and the location of the ulcer he thought it was venous commended she continue with wound care consults. 08/04/16; I note the patient's reasonably functional arterial supply which is fortunate. The wound bed appears to be smaller. Debrided of surface slough. She is been using Iodoflex 08/11/16:  Only a very small open area remains. However it has some depth 08/18/16; small open area is smaller. Still open however. 08/25/16; the area has totally closed down and epithelialized. READMISSION 02/09/17; this is an 83 year old woman who is not a diabetic or smoker. She was in our clinic in the summer into mid-September with a wound on her right medial calf that. This eventually closed down. I thought this was mostly venous. She did have evidence of PAD and she was seen by Dr. Kirke Corin. At that point she had an ABI of 0.94 on the right 0.69 on the left with a normal right toe brachial index. It was felt that she had enough blood flow to close her wound and that happened. She was discharged with stockings although I don't get the sense that she really use them she tells me that roughly a week or 2 she developed swelling in her bilateral lower legs and then noted wounds on her right lateral malleolus and left medial calf. The on this she is not  really sure how this happened. She is not having a lot of pain although she is not ambulatory that much only in her home. Other than weight loss she has not noticed any other issues no specific claudication no chest pain. 02/16/17; the patient continues with 2 wounds on the right lateral malleolus and left medial calf. Both of these are painful. She has chronic venous insufficiency but also known PAD and is previously seen Dr. Kirke Corin. We have arranged vascular follow-up with Dr. Kirke Corin to look at the arterial situation 02/25/2017 -- patient was brought in today because she missed her appointment last week with Dr. Leanord Hawking and is having a lot of pain and drainage from her wounds. The interim she was seen by Dr. Bascom Levels who did a duplex of the right lower extremity which shows a right ABI of 0.95 and left ABI 0.74. There was also a abdominal aortogram with a bilateral extremity angiography done for significant peripheral vascular disease. The right lower extremity showed diffuse SFA and popliteal disease with significant stenosis affecting the TP trunk just before the origin of the peritoneal artery. The left lower extremity showed moderate left SFA stenosis with occluded popliteal artery and tibioperoneal vessels with only visible collateral. The opinion was the patient had no endovascular surgical revascularization options of the left lower extremity and Dr. Bascom Levels would discuss it with Dr. Randie Heinz for a second opinion. Endovascular intervention on the right TP trunk could be considered. 03/03/17; the patient arrives back today having last seen Dr. Meyer Russel. As noted she has severe PAD which is not amenable to either bypass surgery or endovascular interventions on the left. Endovascular interventions in the right TP trunk could be considered. She has large wounds on the right medial leg and a sizable wound on the right lateral leg. Small wound on the left medial leg. She is really in a lot of discomfort.  Using Santyl on the right and I believe Prisma on the left. 03/10/17; the patient arrives today again with large wounds on the right lateral lower extremity, right medial lower extremity. And a small punched out wound on the left medial lower extremity. These are in the setting of severe non-revascularizable PAD. She continues to complain of discomfort although I don't think right now this is unmanageable. We have been using Santyl. We are arranging getting her home health [[facility]] 03/16/17- patient is here for follow-up evaluation of her bilateral lower extremity ulcers. She has an appointment  on 4/11 for an arthrectomy per Dr. Kirke Corin at [facility] She is continuing to [place]. Her son is doing her dressing changes 03/30/17 still using santyl. sincer her last visit she was admitted from 4/11-4/12 for arthrectomy and balloon [name], [name]. ([id number]) angioplasty of the right distal popliteal artery with TP trunk and peritoneal arteries. she is on dual antiplatelet and states her pain is somewhat better on the right 04/13/17 still using santyl to all wounds. Area on the right medial and left lateral look better. Still difficulties with the right lateral wouind. Combination of arterial insufficiency and Venous stasis No revascularization options on the left. Possible interventions on the right (see above) 04/20/17; the patient is still using Santyl to all wound areas. We've had some improvement in the right medial and left lateral. Patient has a combination of arterial and venous insufficiency. No revascularization options are available to the patient on the left however possible interventions on the right. 04/27/17; I continued using Santyl to all wound areas. We've had improvement in the right medial wound and the left lateral wound. The right lateral lower leg wound has remained static. She arrives today with a lot more edema in her bilateral legs this is pitting. She does not complain of  chest pain or shortness of breath tells me she has no prior cardiac issues. 05/12/17; I been using Santyl to all of this lady's wounds. She has an appointment with Dr. Kirke Corin next week. The other issue this week is that apparently her insurance and [facility] are no longer under contract though there was some suggestion that there is another home health company, we can't really determine who that was today. We change the primary dressing to all the wounds to [facility] 05/19/17; patient's appointment with Dr. Kirke Corin is actually next week. We have been using Hydrofera Blue to her wounds 05/26/17 as it turns out Arida's appointment was canceled by his office. It is now in early July. Patient arrives today with a large amount of green drainage coming out of the right lateral leg, she was also discovered by our intake nurse that have an abscess on the left lateral lower leg. We did not previously define a wound in this area. 06/01/17; culture of the abscess on the left lateral leg we did last week showed a rib fairly resistant MRSA. I brought the patient in early today because of this. I had put her on Doxy and Cipro last week for this and also green drainage coming out of the right lateral wound. Fortunately all the wounds today looks somewhat better. We have been using Aquacel Ag 06/09/17; she is completed antibiotics. We are using Aquacel Ag to all wound areas. She has had closure on the right medial leg and improvement especially on the right lateral leg. 06/25/17 on evaluation today patient appears to be doing well in regard to her bilateral lower extremity wounds. The left lateral ankle wound appears to be healed which is excellent news. Fortunately she has been tolerating the Kerlex in Coban wraps without complication. 07/06/17; the patient's wounds have healed bilaterally. The left lateral calf/ankle wound has healed. She has been in Kerlix and Coban wraps. She tells me she does not have  significant edema in her legs historically although she does have severely damaged skin bilaterally from chronic venous hypertension and inflammation Objective Constitutional Sitting or standing Blood Pressure is within target range for patient.. Pulse regular and within target range for patient.Marland Kitchen Respirations regular, non-labored and within target  range.. Temperature is normal and within the target range for the patient.Marland Kitchen appears in no distress. LAKIAH, DHINGRA ([id number]) Vitals Time Taken: 1:30 PM, Height: 66 in, Weight: 159 lbs, BMI: 25.7, Temperature: 98.2 F, Pulse: 71 bpm, Respiratory Rate: 16 breaths/min, Blood Pressure: 138/46 mmHg. General Notes: Wound exam; patient's right lower extremity wounds have completely healed over and are well epithelialized. Area on the left medial lower calf area also looks fully epithelialized after careful inspection Integumentary (Hair, Skin) Wound #2 status is Healed - Epithelialized. Original cause of wound was Gradually Appeared. The wound is located on the Right,Lateral Malleolus. The wound measures 0cm length x 0cm width x 0cm depth; 0cm^2 area and 0cm^3 volume. There is no tunneling or undermining noted. There is a none present amount of drainage noted. There is no granulation within the wound bed. There is no necrotic tissue within the wound bed. The periwound skin appearance exhibited: Scarring. Periwound temperature was noted as No Abnormality. The periwound has tenderness on palpation. Wound #3 status is Healed - Epithelialized. Original cause of wound was Gradually Appeared. The wound is located on the Left,Medial Lower Leg. The wound measures 0cm length x 0cm width x 0cm depth; 0cm^2 area and 0cm^3 volume. There is no tunneling or undermining noted. There is a large amount of serous drainage noted. The wound margin is distinct with the outline attached to the wound base. There is no granulation within the wound bed. There is no necrotic  tissue within the wound bed. The periwound skin appearance exhibited: Scarring, Hemosiderin Staining. The periwound skin appearance did not exhibit: Callus, Crepitus, Excoriation, Induration, Rash, Dry/Scaly, Maceration, Atrophie Blanche, Cyanosis, Ecchymosis, Mottled, Pallor, Rubor, Erythema. Periwound temperature was noted as No Abnormality. The periwound has tenderness on palpation. 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 I70.243 - Atherosclerosis of native arteries of left leg with ulceration of ankle Plan Edema Control: [name], [name] ([id number]) Patient to wear own compression stockings Discharge From [facility] Services: Discharge from Wound Care Center - treatment complete The patient to be discharged from the wound care center #2 she has chronic venous insufficiency but does not have uncontrolled edema #3 for now I think she can be managed with support stockings however if she has skin breakdown she may need more aggressive compression keeping in mind that she has coexistent PAD Electronic Signature(s) Signed: 07/06/2017 5:23:04 PM By: Baltazar Najjar MD Entered By: Baltazar Najjar on 07/06/2017 14:42:42 [name], [name] ([id number]) -------------------------------------------------------------------------------- SuperBill Details [name] Date of Service: 07/06/2017 Medical Record Patient Account Number: [id number] Number: Treating RN: Huel Coventry 04-24-1931 (83 y.o. 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 L97.221 Non-pressure chronic ulcer of left calf limited to breakdown of skin I70.233 Atherosclerosis of native arteries of right leg with ulceration of ankle I70.243 Atherosclerosis of native arteries of left leg with ulceration of ankle Facility Procedures CPT4 Code: [id number] Description: 29528 - WOUND CARE VISIT-LEV 2  EST PT Modifier: Quantity: 1 Physician Procedures CPT4 Code Description: [id number] - WC PHYS LEVEL 2 - EST PT ICD-10 Description Diagnosis I70.233 Atherosclerosis of native arteries of right leg with L97.221 Non-pressure chronic ulcer of left calf limited to br Modifier: ulceration of ankl eakdown of skin Quantity: 1 e Electronic Signature(s) Signed: 07/06/2017 5:23:04 PM By: Baltazar Najjarobson, Carleah Yablonski MD Entered By: Baltazar Najjarobson, Rydge Texidor on 07/06/2017 14:43:16

## 2017-07-08 NOTE — Progress Notes (Signed)
KYLEAH, PENSABENE ([id number]) Visit Report for 07/06/2017 Arrival Information Details [name], [name]. Date of Service: 07/06/2017 1:30 PM Medical Record Patient Account Number: [id number] Number: Treating RN: Huel Coventry 30-Aug-1931 (83 y.o. Other Clinician: Date of Birth/Sex: Female) Treating ROBSON, MICHAEL Primary Care Rylei Masella: Guy Begin Peggye Poon/Extender: G Referring Efrat Zuidema: [name] in Treatment: 21 Visit Information History Since Last Visit Added or deleted any medications: No Patient Arrived: Wheel Chair Any new allergies or adverse reactions: No Arrival Time: 13:29 Had a fall or experienced change in No Accompanied By: son activities of daily living that may affect Transfer Assistance: Manual risk of falls: Patient Identification Verified: Yes Signs or symptoms of abuse/neglect since last No Patient Requires Transmission- No visito Based Precautions: Hospitalized since last visit: No Patient Has Alerts: Yes Has Dressing in Place as Prescribed: Yes Patient Alerts: Patient on Blood Pain Present Now: No Thinner Electronic Signature(s) Signed: 07/06/2017 2:27:38 PM By: Elliot Gurney, BSN, RN, CWS, Kim RN, BSN Entered By: Elliot Gurney, BSN, RN, CWS, Kim on 07/06/2017 13:30:24 Ayala, Jordan Rhyme ([id number]) -------------------------------------------------------------------------------- Clinic Level of Care Assessment Details [name], [name]. Date of Service: 07/06/2017 1:30 PM Medical Record Patient Account Number: [id number] Number: Treating RN: Huel Coventry Mar 03, 1931 (83 y.o. Other Clinician: Date of Birth/Sex: Female) Treating ROBSON, MICHAEL Primary Care [place] Severs: Guy Begin Zahi Plaskett/Extender: G Referring Shilee Biggs: [name] in Treatment: 21 Clinic Level of Care Assessment Items TOOL 4 Quantity Score []  - Use when only an EandM is performed on FOLLOW-UP visit 0 ASSESSMENTS - Nursing Assessment /  Reassessment []  - Reassessment of Co-morbidities (includes updates in patient status) 0 X - Reassessment of Adherence to Treatment Plan 1 5 ASSESSMENTS - Wound and Skin Assessment / Reassessment X - Simple Wound Assessment / Reassessment - one wound 1 5 []  - Complex Wound Assessment / Reassessment - multiple wounds 0 []  - Dermatologic / Skin Assessment (not related to wound area) 0 ASSESSMENTS - Focused Assessment []  - Circumferential Edema Measurements - multi extremities 0 []  - Nutritional Assessment / Counseling / Intervention 0 []  - Lower Extremity Assessment (monofilament, tuning fork, pulses) 0 []  - Peripheral Arterial Disease Assessment (using hand held doppler) 0 ASSESSMENTS - Ostomy and/or Continence Assessment and Care []  - Incontinence Assessment and Management 0 []  - Ostomy Care Assessment and Management (repouching, etc.) 0 PROCESS - Coordination of Care X - Simple Patient / Family Education for ongoing care 1 15 []  - Complex (extensive) Patient / Family Education for ongoing care 0 []  - Staff obtains Chiropractor, Records, Test Results / Process Orders 0 []  - Staff telephones HHA, Nursing Homes / Clarify orders / etc 0 Fuentes, Caiya J. ([id number]) []  - Routine Transfer to another Facility (non-emergent condition) 0 []  - Routine Hospital Admission (non-emergent condition) 0 []  - New Admissions / Manufacturing engineer / Ordering NPWT, Apligraf, etc. 0 []  - Emergency Hospital Admission (emergent condition) 0 X - Simple Discharge Coordination 1 10 []  - Complex (extensive) Discharge Coordination 0 PROCESS - Special Needs []  - Pediatric / Minor Patient Management 0 []  - Isolation Patient Management 0 []  - Hearing / Language / Visual special needs 0 []  - Assessment of Community assistance (transportation, D/C planning, etc.) 0 []  - Additional assistance / Altered mentation 0 []  - Support Surface(s) Assessment (bed, cushion, seat, etc.) 0 INTERVENTIONS - Wound Cleansing /  Measurement X - Simple Wound Cleansing - one wound 1 5 []  - Complex Wound Cleansing - multiple wounds 0 X - Wound  Imaging (photographs - any number of wounds) 1 5 []  - Wound Tracing (instead of photographs) 0 X - Simple Wound Measurement - one wound 1 5 []  - Complex Wound Measurement - multiple wounds 0 INTERVENTIONS - Wound Dressings []  - Small Wound Dressing one or multiple wounds 0 []  - Medium Wound Dressing one or multiple wounds 0 []  - Large Wound Dressing one or multiple wounds 0 []  - Application of Medications - topical 0 []  - Application of Medications - injection 0 Dolberry, Nettye J. ([id number]) INTERVENTIONS - Miscellaneous []  - External ear exam 0 []  - Specimen Collection (cultures, biopsies, blood, body fluids, etc.) 0 []  - Specimen(s) / Culture(s) sent or taken to Lab for analysis 0 []  - Patient Transfer (multiple staff / Michiel SitesHoyer Lift / Similar devices) 0 []  - Simple Staple / Suture removal (25 or less) 0 []  - Complex Staple / Suture removal (26 or more) 0 []  - Hypo / Hyperglycemic Management (close monitor of Blood Glucose) 0 []  - Ankle / Brachial Index (ABI) - do not check if billed separately 0 X - Vital Signs 1 5 Has the patient been seen at the hospital within the last three years: Yes Total Score: 55 Level Of Care: New/Established - Level 2 Electronic Signature(s) Signed: 07/06/2017 2:27:38 PM By: Elliot GurneyWoody, BSN, RN, CWS, Kim RN, BSN Entered By: Elliot GurneyWoody, BSN, RN, CWS, Kim on 07/06/2017 14:15:14 Cheramie, [name] ([id number]) -------------------------------------------------------------------------------- Encounter Discharge Information Details [name], Jordan J. Date of Service: 07/06/2017 1:30 PM Medical Record Patient Account Number: [id number] Number: Treating RN: Huel CoventryWoody, Kim 1931-02-03 ([age] y.o. Other Clinician: Date of Birth/Sex: Female) Treating ROBSON, MICHAEL Primary Care [place]: [name], NARGES Seriah Brotzman/Extender: G Referring Sadhana Frater:  Gabriel RungFARAHI, NARGES Weeks in Treatment: 21 Encounter Discharge Information Items Discharge Pain Level: 0 Discharge Condition: Stable Ambulatory Status: Wheelchair Discharge Destination: Home Transportation: Private Auto Accompanied By: son, Les PouCarlton Schedule Follow-up Appointment: Yes Medication Reconciliation completed and provided to Patient/Care Yes Afton Lavalle: Provided on Clinical Summary of Care: 07/06/2017 Form Type Recipient Paper Patient CF Electronic Signature(s) Signed: 07/06/2017 2:27:38 PM By: Elliot GurneyWoody, BSN, RN, CWS, Kim RN, BSN Previous Signature: 07/06/2017 2:12:00 PM Version By: Gwenlyn PerkingMoore, Shelia Entered By: Elliot GurneyWoody, BSN, RN, CWS, Kim on 07/06/2017 14:15:45 Liberati, [name] ([id number]) -------------------------------------------------------------------------------- Lower Extremity Assessment Details [name], Jordan J. Date of Service: 07/06/2017 1:30 PM Medical Record Patient Account Number: [id number] Number: Treating RN: Huel CoventryWoody, Kim 1931-02-03 ([age] y.o. Other Clinician: Date of Birth/Sex: Female) Treating ROBSON, MICHAEL Primary Care Philopateer Strine: [name], NARGES Jayvyn Haselton/Extender: G Referring Aubrianna Orchard: Gabriel RungFARAHI, NARGES Weeks in Treatment: 21 Vascular Assessment Pulses: Dorsalis Pedis Palpable: [Left:Yes] [Right:Yes] Posterior Tibial Extremity colors, hair growth, and conditions: Extremity Color: [Left:Hyperpigmented] [Right:Hyperpigmented] Hair Growth on Extremity: [Left:No] [Right:No] Temperature of Extremity: [Left:Warm] [Right:Warm] Capillary Refill: [Left:< 3 seconds] [Right:< 3 seconds] Dependent Rubor: [Left:No] [Right:No] Blanched when Elevated: [Left:No] [Right:No] Lipodermatosclerosis: [Left:No] [Right:No] Toe Nail Assessment Left: Right: Thick: Yes Yes Discolored: Yes Yes Deformed: No No Improper Length and Hygiene: No Yes Electronic Signature(s) Signed: 07/06/2017 2:27:38 PM By: Elliot GurneyWoody, BSN, RN, CWS, Kim RN, BSN Entered By: Elliot GurneyWoody, BSN, RN,  CWS, Kim on 07/06/2017 13:44:15 Weitzman, [name] ([id number]) -------------------------------------------------------------------------------- Multi Wound Chart Details [name], Jordan J. Date of Service: 07/06/2017 1:30 PM Medical Record Patient Account Number: [id number] Number: Treating RN: Huel CoventryWoody, Kim 1931-02-03 ([age] y.o. Other Clinician: Date of Birth/Sex: Female) Treating ROBSON, MICHAEL Primary Care Livana Yerian: [name], NARGES Sihaam Chrobak/Extender: G Referring Taber Sweetser: [name], NARGES Weeks in Treatment: 21 Vital Signs Height(in): 66  Pulse(bpm): 71 Weight(lbs): 159 Blood Pressure 138/46 (mmHg): Body Mass Index(BMI): 26 Temperature(F): 98.2 Respiratory Rate 16 (breaths/min): Photos: [N/A:N/A] Wound Location: Right Malleolus - Lateral Left Lower Leg - Medial N/A Wounding Event: Gradually Appeared Gradually Appeared N/A Primary Etiology: Venous Leg Ulcer Venous Leg Ulcer N/A Comorbid History: Cataracts, Arrhythmia, Cataracts, Arrhythmia, N/A Hypertension, Hypertension, Osteoarthritis Osteoarthritis Date Acquired: 01/19/2017 01/19/2017 N/A Weeks of Treatment: 21 21 N/A Wound Status: Healed - Epithelialized Healed - Epithelialized N/A Measurements L x W x D 0x0x0 0x0x0 N/A (cm) Area (cm) : 0 0 N/A Volume (cm) : 0 0 N/A % Reduction in Area: 100.00% 100.00% N/A % Reduction in Volume: 100.00% 100.00% N/A Classification: Full Thickness Without Full Thickness Without N/A Exposed Support Exposed Support Structures Structures Exudate Amount: None Present Large N/A Exudate Type: N/A Serous N/A Exudate Color: N/A amber N/A Wound Margin: N/A Distinct, outline attached N/A Leoni, Goodness Dashayla J. ([id number]) Granulation Amount: None Present (0%) None Present (0%) N/A Necrotic Amount: None Present (0%) None Present (0%) N/A Exposed Structures: Fascia: No Fascia: No N/A Fat Layer (Subcutaneous Fat Layer (Subcutaneous Tissue) Exposed: No Tissue) Exposed:  No Tendon: No Tendon: No Muscle: No Muscle: No Joint: No Joint: No Bone: No Bone: No Epithelialization: Large (67-100%) Large (67-100%) N/A Periwound Skin Texture: Scarring: Yes Scarring: Yes N/A Excoriation: No Induration: No Callus: No Crepitus: No Rash: No Periwound Skin No Abnormalities Noted Maceration: No N/A Moisture: Dry/Scaly: No Periwound Skin Color: No Abnormalities Noted Hemosiderin Staining: Yes N/A Atrophie Blanche: No Cyanosis: No Ecchymosis: No Erythema: No Mottled: No Pallor: No Rubor: No Temperature: No Abnormality No Abnormality N/A Tenderness on Yes Yes N/A Palpation: Wound Preparation: Ulcer Cleansing: Ulcer Cleansing: N/A Rinsed/Irrigated with Rinsed/Irrigated with Saline Saline Topical Anesthetic Topical Anesthetic Applied: None Applied: Other: lidocaine 4% Treatment Notes Electronic Signature(s) Signed: 07/06/2017 5:23:04 PM By: Baltazar Najjar MD Previous Signature: 07/06/2017 2:27:38 PM Version By: Elliot Gurney, BSN, RN, CWS, Kim RN, BSN Entered By: Baltazar Najjar on 07/06/2017 14:37:08 Wheat, Jordan Rhyme ([id number]) -------------------------------------------------------------------------------- Multi-Disciplinary Care Plan Details Patient Name: FARON, WHITELOCK. Date of Service: 07/06/2017 1:30 PM Medical Record Patient Account Number: [id number] Number: Treating RN: Huel Coventry 1931/05/02 (83 y.o. Other Clinician: Date of Birth/Sex: Female) Treating ROBSON, MICHAEL Primary Care Sahithi Ordoyne: Guy Begin Cashmere Dingley/Extender: G Referring Jetson Pickrel: [name] in Treatment: 21 Active Inactive Electronic Signature(s) Signed: 07/06/2017 2:27:38 PM By: Elliot Gurney, BSN, RN, CWS, Kim RN, BSN Entered By: Elliot Gurney, BSN, RN, CWS, Kim on 07/06/2017 14:14:03 Arquette, Jordan Rhyme ([id number]) -------------------------------------------------------------------------------- Pain Assessment Details [name], [name]. Date of Service:  07/06/2017 1:30 PM Medical Record Patient Account Number: [id number] Number: Treating RN: Huel Coventry January 21, 1931 (83 y.o. Other Clinician: Date of Birth/Sex: Female) Treating ROBSON, MICHAEL Primary Care Fadi Menter: Guy Begin Ronica Vivian/Extender: G Referring Keah Lamba: [name] in Treatment: 21 Active Problems Location of Pain Severity and Description of Pain Patient Has Paino No Site Locations With Dressing Change: No Pain Management and Medication Current Pain Management: Goals for Pain Management Topical or injectable lidocaine is offered to patient for acute pain when surgical debridement is performed. If needed, Patient is instructed to use over the counter pain medication for the following 24-48 hours after debridement. Wound care MDs do not prescribed pain medications. Patient has chronic pain or uncontrolled pain. Patient has been instructed to make an appointment with their Primary Care Physician for pain management. Electronic Signature(s) Signed: 07/06/2017 2:27:38 PM By: Elliot Gurney, BSN, RN, CWS, Kim RN, BSN Entered By: Elliot Gurney, BSN, RN, CWS, Kim  on 07/06/2017 13:30:33 RYNA, BECKSTROM ([id number]) -------------------------------------------------------------------------------- Patient/Caregiver Education Details [name], [name]. Date of Service: 07/06/2017 1:30 PM Medical Record Patient Account Number: [id number] Number: Treating RN: Huel Coventry 13-Feb-1931 (83 y.o. Other Clinician: Date of Birth/Gender: Female) Treating ROBSON, MICHAEL Primary Care Physician: Guy Begin Physician/Extender: G Referring Physician: [name] in Treatment: 21 Education Assessment Education Provided To: Patient Education Topics Provided Venous: Handouts: Controlling Swelling with Compression Stockings , Other: wear daily Methods: Demonstration Responses: State content correctly Electronic Signature(s) Signed: 07/06/2017 2:27:38  PM By: Elliot Gurney, BSN, RN, CWS, Kim RN, BSN Entered By: Elliot Gurney, BSN, RN, CWS, Kim on 07/06/2017 14:16:13 Tilmon, Jordan Rhyme ([id number]) -------------------------------------------------------------------------------- Wound Assessment Details [name], [name]. Date of Service: 07/06/2017 1:30 PM Medical Record Patient Account Number: [id number] Number: Treating RN: Huel Coventry [birth year] (83 y.o. Other Clinician: Date of Birth/Sex: Female) Treating ROBSON, MICHAEL Primary Care Tamir Wallman: Guy Begin Ilee Randleman/Extender: G Referring Milta Croson: Guy Begin Weeks in Treatment: 21 Wound Status Wound Number: 2 Primary Venous Leg Ulcer Etiology: Wound Location: Right Malleolus - Lateral Wound Status: Healed - Epithelialized Wounding Event: Gradually Appeared Comorbid Cataracts, Arrhythmia, Hypertension, Date Acquired: 01/19/2017 History: Osteoarthritis Weeks Of Treatment: 21 Clustered Wound: No Photos Wound Measurements Length: (cm) 0 % Reduction i Width: (cm) 0 % Reduction i Depth: (cm) 0 Epithelializa Area: (cm) 0 Tunneling: Volume: (cm) 0 Undermining: n Area: 100% n Volume: 100% tion: Large (67-100%) No No Wound Description Full Thickness Without Exposed Foul Odor Aft Classification: Support Structures Slough/Fibrin Exudate None Present Amount: er Cleansing: No o No Wound Bed Granulation Amount: None Present (0%) Exposed Structure Necrotic Amount: None Present (0%) Fascia Exposed: No Fat Layer (Subcutaneous Tissue) Exposed: No Tendon Exposed: No Muscle Exposed: No Joint Exposed: No Bone Exposed: No Duren, Brandan J. ([id number]) Periwound Skin Texture Texture Color No Abnormalities Noted: No No Abnormalities Noted: No Scarring: Yes Temperature / Pain Moisture Temperature: No Abnormality No Abnormalities Noted: No Tenderness on Palpation: Yes Wound Preparation Ulcer Cleansing: Rinsed/Irrigated with Saline Topical Anesthetic Applied:  None Electronic Signature(s) Signed: 07/06/2017 2:27:38 PM By: Elliot Gurney, BSN, RN, CWS, Kim RN, BSN Entered By: Elliot Gurney, BSN, RN, CWS, Kim on 07/06/2017 13:45:40 Gallo, Jordan Rhyme ([id number]) -------------------------------------------------------------------------------- Wound Assessment Details Patient Name: Jordan Banda. Date of Service: 07/06/2017 1:30 PM Medical Record Patient Account Number: [id number] Number: Treating RN: Huel Coventry 1931-05-29 (83 y.o. Other Clinician: Date of Birth/Sex: Female) Treating ROBSON, MICHAEL Primary Care Raechel Marcos: Guy Begin Marisela Line/Extender: G Referring Terryn Rosenkranz: Guy Begin Weeks in Treatment: 21 Wound Status Wound Number: 3 Primary Venous Leg Ulcer Etiology: Wound Location: Left Lower Leg - Medial Wound Status: Healed - Epithelialized Wounding Event: Gradually Appeared Comorbid Cataracts, Arrhythmia, Hypertension, Date Acquired: 01/19/2017 History: Osteoarthritis Weeks Of Treatment: 21 Clustered Wound: No Photos Wound Measurements Length: (cm) 0 % Reduction in Width: (cm) 0 % Reduction in Depth: (cm) 0 Epithelializati Area: (cm) 0 Tunneling: Volume: (cm) 0 Undermining: Area: 100% Volume: 100% on: Large (67-100%) No No Wound Description Full Thickness Without Exposed Classification: Support Structures Wound Margin: Distinct, outline attached Exudate Large Amount: Exudate Type: Serous Exudate Color: amber Foul Odor After Cleansing: No Slough/Fibrino Yes Wound Bed Granulation Amount: None Present (0%) Exposed Structure Necrotic Amount: None Present (0%) Fascia Exposed: No Fat Layer (Subcutaneous Tissue) Exposed: No Tendon Exposed: No Ralph, Lateka J. ([id number]) Muscle Exposed: No Joint Exposed: No Bone Exposed: No Periwound Skin Texture Texture Color No Abnormalities Noted: No No Abnormalities Noted: No Callus: No Atrophie Blanche: No Crepitus:  No Cyanosis: No Excoriation: No Ecchymosis:  No Induration: No Erythema: No Rash: No Hemosiderin Staining: Yes Scarring: Yes Mottled: No Pallor: No Moisture Rubor: No No Abnormalities Noted: No Dry / Scaly: No Temperature / Pain Maceration: No Temperature: No Abnormality Tenderness on Palpation: Yes Wound Preparation Ulcer Cleansing: Rinsed/Irrigated with Saline Topical Anesthetic Applied: Other: lidocaine 4%, Electronic Signature(s) Signed: 07/06/2017 2:27:38 PM By: Elliot GurneyWoody, BSN, RN, CWS, Kim RN, BSN Entered By: Elliot GurneyWoody, BSN, RN, CWS, Kim on 07/06/2017 14:13:18 Toni ArthursFULLER, [name] ([id number]) -------------------------------------------------------------------------------- Vitals Details [name], Jordan J. Date of Service: 07/06/2017 1:30 PM Medical Record Patient Account Number: [id number] Number: Treating RN: Huel CoventryWoody, Kim 1931/07/06 ([age] y.o. Other Clinician: Date of Birth/Sex: Female) Treating ROBSON, MICHAEL Primary Care Eyal Greenhaw: [name], NARGES Carla Whilden/Extender: G Referring Shabreka Coulon: [name], NARGES Weeks in Treatment: 21 Vital Signs Time Taken: 13:30 Temperature (F): 98.2 Height (in): 66 Pulse (bpm): 71 Weight (lbs): 159 Respiratory Rate (breaths/min): 16 Body Mass Index (BMI): 25.7 Blood Pressure (mmHg): 138/46 Reference Range: 80 - 120 mg / dl Electronic Signature(s) Signed: 07/06/2017 2:27:38 PM By: Elliot GurneyWoody, BSN, RN, CWS, Kim RN, BSN Entered By: Elliot GurneyWoody, BSN, RN, CWS, Kim on 07/06/2017 13:30:50

## 2017-08-24 ENCOUNTER — Encounter: Payer: Self-pay | Admitting: Cardiovascular Disease

## 2017-08-24 ENCOUNTER — Ambulatory Visit (INDEPENDENT_AMBULATORY_CARE_PROVIDER_SITE_OTHER): Payer: Medicare Other | Admitting: Cardiovascular Disease

## 2017-08-24 VITALS — BP 140/64 | HR 70 | Ht 66.0 in | Wt 149.8 lb

## 2017-08-24 DIAGNOSIS — E785 Hyperlipidemia, unspecified: Secondary | ICD-10-CM | POA: Diagnosis not present

## 2017-08-24 DIAGNOSIS — I739 Peripheral vascular disease, unspecified: Secondary | ICD-10-CM

## 2017-08-24 DIAGNOSIS — I10 Essential (primary) hypertension: Secondary | ICD-10-CM

## 2017-08-24 NOTE — Progress Notes (Signed)
Cardiology Office Note   Date:  08/24/2017   ID:  [name], DOB 09-08-1931, MRN [id number]  PCP:  Guy Begin, MD  Cardiologist:   Lorine Bears, MD   Chief Complaint  Patient presents with  . other    1 month follow up. Patient states she is doing well. Meds reviewed verbally with patient.       History of Present Illness: [name] is a 83 y.o. female who is here today for a follow-up visit regarding peripheral arterial disease. The patient has no history of diabetes or tobacco use. She is known to have hypertension. She is known to have peripheral arterial disease with significantly abnormal ABI. The patient was seen early this year for recurrent ulceration on both legs.  I proceeded with angiography in march which showed: 1. No significant aortoiliac disease. 2. Right lower extremity: Mild diffuse SFA and popliteal disease with significant stenosis affecting the TP trunk just before the origin of the peroneal artery which was the only patent vessel below the knee. 3. Left lower extremity: Moderate left SFA stenosis with occluded popliteal artery and tibial peroneal vessels with only visible collaterals and short segments of reconstitution.  No revascularization options of the left lower extremity. She has no targets for bypass and no options for endovascular intervention given that there is no reconstitution of her tibial peroneal vessels.  Due to worsening wound affecting the right lower extremity, I proceeded with successful orbital atherectomy and balloon angioplasty of the right distal popliteal artery, TP trunk and peroneal artery in April of this year.  The wounds healed completely since then and she has been doing well with no significant pain. No claudication. She was taking her medications regularly.  Past Medical History:  Diagnosis Date  . Arthritis    "maybe a little" (03/24/2017)  . Dizziness   . Hypertension     Past Surgical History:    Procedure Laterality Date  . ABDOMINAL AORTIC ANEURYSM REPAIR     hx/notes 07/26/2012  . ABDOMINAL AORTOGRAM N/A 02/24/2017   Procedure: Abdominal Aortogram;  Surgeon: Iran Ouch, MD;  Location: [facility];  Service: Cardiovascular;  Laterality: N/A;  . HERNIA REPAIR    . JOINT REPLACEMENT    . LAPAROSCOPIC INCISIONAL / UMBILICAL / VENTRAL HERNIA REPAIR  2010   VHR w/mesh hx/notes 07/26/2012  . LOWER EXTREMITY ANGIOGRAPHY Bilateral 02/24/2017   Procedure: Lower Extremity Angiography;  Surgeon: Iran Ouch, MD;  Location: [facility];  Service: Cardiovascular;  Laterality: Bilateral;  . PERIPHERAL VASCULAR ATHERECTOMY  03/24/2017  . PERIPHERAL VASCULAR ATHERECTOMY Right 03/24/2017   Procedure: Peripheral Vascular Atherectomy;  Surgeon: Iran Ouch, MD;  Location: [facility];  Service: Cardiovascular;  Laterality: Right;  . TOTAL KNEE ARTHROPLASTY Left      Current Outpatient Prescriptions  Medication Sig Dispense Refill  . acetaminophen (TYLENOL) 500 MG tablet Take 1,000 mg by mouth 2 (two) times daily as needed for moderate pain or headache.    Marland Kitchen atorvastatin (LIPITOR) 20 MG tablet Take 1 tablet (20 mg total) by mouth daily at 6 PM. 30 tablet 6  . Biotin 1000 MCG tablet Take 1,000 mcg by mouth every evening.    . cholecalciferol (VITAMIN D) 1000 units tablet Take 1,000 Units by mouth every evening.     . clopidogrel (PLAVIX) 75 MG tablet Take 1 tablet (75 mg total) by mouth daily. 30 tablet 3  . furosemide (LASIX) 20 MG  tablet Take 20 mg by mouth daily with breakfast.  9  . labetalol (NORMODYNE) 100 MG tablet Take 100 mg by mouth 2 (two) times daily.   5  . traMADol (ULTRAM) 50 MG tablet Take 1 tablet (50 mg total) by mouth 2 (two) times daily as needed for moderate pain. 60 tablet 0  . triamterene-hydrochlorothiazide (MAXZIDE-25) 37.5-25 MG per tablet Take 1 tablet by mouth daily.  5  . amLODipine (NORVASC) 5 MG tablet Take 1 tablet (5 mg total) by  mouth daily. 30 tablet 3   No current facility-administered medications for this visit.     Allergies:   Sulfa antibiotics    Social History:  The patient  reports that she has never smoked. She has never used smokeless tobacco. She reports that she does not drink alcohol or use drugs.   Family History:  The patient's Family history is negative for coronary artery disease.   ROS:  Please see the history of present illness.   Otherwise, review of systems are positive for none.   All other systems are reviewed and negative.    PHYSICAL EXAM: VS:  BP 140/64 (BP Location: Right Arm, Patient Position: Sitting, Cuff Size: Normal)   Pulse 70   Ht 5\' 6"  (1.676 m)   Wt 149 lb 12 oz (67.9 kg)   BMI 24.17 kg/m  , BMI Body mass index is 24.17 kg/m. GEN: Well nourished, well developed, in no acute distress  HEENT: normal  Neck: no JVD, carotid bruits, or masses Cardiac: RRR; no murmurs, rubs, or gallops,no edema  Respiratory:  clear to auscultation bilaterally, normal work of breathing GI: soft, nontender, nondistended, + BS MS: no deformity or atrophy  Skin: warm and dry, no rash Neuro:  Strength and sensation are intact Psych: euthymic mood, full affect EKG:  EKG is not ordered today.    Recent Labs: 03/22/2017: Hemoglobin 13.0; Platelets 167 03/25/2017: BUN 25; Creatinine, Ser 1.07; Potassium 3.2; Sodium 132    Lipid Panel No results found for: CHOL, TRIG, HDL, CHOLHDL, VLDL, LDLCALC, LDLDIRECT    Wt Readings from Last 3 Encounters:  08/24/17 149 lb 12 oz (67.9 kg)  04/20/17 143 lb (64.9 kg)  03/25/17 133 lb 6.1 oz (60.5 kg)         ASSESSMENT AND PLAN:  1. Peripheral arterial disease: Recurrent lower extremity wounds due to venous insufficiency as well as arterial insufficiency. Status post endovascular intervention on the right leg. The wounds healed completely bilaterally and she has no significant claudication. Continue medical therapy. Continue long-term treatment  with clopidogrel. Repeat lower extremity arterial Doppler/duplex in 6 months.  2. Essential hypertension: Blood pressure is reasonably controlled on current medications. Lower extremity edema improved after decreasing amlodipine to 5 mg once daily. We can consider switching this to an ARB if needed. An echocardiogram can be considered for leg edema.  3. Hyperlipidemia: Continue treatment with atorvastatin with a target LDL of less than 70.   Disposition:   FU with me in 6 months.  Signed,  Lorine BearsMuhammad Olman Yono, MD  08/24/2017 2:57 PM    [facility]

## 2017-08-24 NOTE — Patient Instructions (Signed)
Medication Instructions:  Your physician recommends that you continue on your current medications as directed. Please refer to the Current Medication list given to you today.   Labwork: None  Testing/Procedures: Your physician has requested that you have a lower extremity arterial exercise duplex in 6 months. During this test, exercise and ultrasound are used to evaluate arterial blood flow in the legs. Allow one hour for this exam. There are no restrictions or special instructions.   Follow-Up: Your physician wants you to follow-up in: 6 months with Dr. Kirke CorinArida.  You will receive a reminder letter in the mail two months in advance. If you don't receive a letter, please call our office to schedule the follow-up appointment.   Any Other Special Instructions Will Be Listed Below (If Applicable).     If you need a refill on your cardiac medications before your next appointment, please call your pharmacy.

## 2017-10-06 ENCOUNTER — Other Ambulatory Visit: Payer: Self-pay | Admitting: Family Medicine

## 2017-10-06 DIAGNOSIS — M7989 Other specified soft tissue disorders: Secondary | ICD-10-CM

## 2017-10-13 ENCOUNTER — Other Ambulatory Visit: Payer: Medicare Other

## 2018-02-21 ENCOUNTER — Other Ambulatory Visit: Payer: Self-pay | Admitting: Family Medicine

## 2018-02-21 DIAGNOSIS — Z1231 Encounter for screening mammogram for malignant neoplasm of breast: Secondary | ICD-10-CM

## 2018-02-25 ENCOUNTER — Ambulatory Visit
Admission: RE | Admit: 2018-02-25 | Discharge: 2018-02-25 | Disposition: A | Discharge status: Home / Self Care | Payer: Medicare Other | Source: Ambulatory Visit | Attending: Family Medicine | Admitting: Family Medicine

## 2018-02-25 DIAGNOSIS — Z1231 Encounter for screening mammogram for malignant neoplasm of breast: Secondary | ICD-10-CM

## 2018-03-02 ENCOUNTER — Other Ambulatory Visit: Payer: Self-pay | Admitting: Family Medicine

## 2018-03-02 DIAGNOSIS — N6320 Unspecified lump in the left breast, unspecified quadrant: Secondary | ICD-10-CM

## 2018-03-02 DIAGNOSIS — N6310 Unspecified lump in the right breast, unspecified quadrant: Secondary | ICD-10-CM

## 2018-03-02 DIAGNOSIS — R928 Other abnormal and inconclusive findings on diagnostic imaging of breast: Secondary | ICD-10-CM

## 2018-03-08 ENCOUNTER — Other Ambulatory Visit: Payer: Medicare Other

## 2018-03-08 ENCOUNTER — Ambulatory Visit: Payer: Medicare Other

## 2018-03-09 ENCOUNTER — Other Ambulatory Visit: Payer: Self-pay | Admitting: Cardiovascular Disease

## 2018-03-09 DIAGNOSIS — I739 Peripheral vascular disease, unspecified: Secondary | ICD-10-CM

## 2018-03-16 ENCOUNTER — Ambulatory Visit: Payer: Medicare Other

## 2018-03-16 ENCOUNTER — Ambulatory Visit: Payer: Medicare Other | Attending: Family Medicine

## 2018-03-16 DIAGNOSIS — D696 Thrombocytopenia, unspecified: Secondary | ICD-10-CM | POA: Insufficient documentation

## 2018-03-16 DIAGNOSIS — I4891 Unspecified atrial fibrillation: Secondary | ICD-10-CM | POA: Insufficient documentation

## 2018-03-16 DIAGNOSIS — R531 Weakness: Secondary | ICD-10-CM | POA: Insufficient documentation

## 2018-03-16 DIAGNOSIS — N3000 Acute cystitis without hematuria: Secondary | ICD-10-CM | POA: Insufficient documentation

## 2018-04-06 ENCOUNTER — Ambulatory Visit
Admission: RE | Admit: 2018-04-06 | Discharge: 2018-04-06 | Disposition: A | Discharge status: Home / Self Care | Payer: Medicare Other | Source: Ambulatory Visit | Attending: Family Medicine | Admitting: Family Medicine

## 2018-04-06 DIAGNOSIS — N6001 Solitary cyst of right breast: Secondary | ICD-10-CM | POA: Insufficient documentation

## 2018-04-06 DIAGNOSIS — N6320 Unspecified lump in the left breast, unspecified quadrant: Secondary | ICD-10-CM

## 2018-04-06 DIAGNOSIS — R928 Other abnormal and inconclusive findings on diagnostic imaging of breast: Secondary | ICD-10-CM

## 2018-04-06 DIAGNOSIS — N6310 Unspecified lump in the right breast, unspecified quadrant: Secondary | ICD-10-CM

## 2018-05-03 ENCOUNTER — Encounter: Payer: Self-pay | Admitting: Physician Assistant

## 2018-05-03 NOTE — Progress Notes (Deleted)
Cardiology Office Note Date:  05/03/2018  Patient ID:  [name], [name] 03-Dec-1931, MRN [id number] PCP:  Guy Begin, MD  Cardiologist:  Dr. Kirke Corin, MD  ***refresh   Chief Complaint: Follow up PAD  History of Present Illness: [name] is a 84 y.o. female with history of PAD as detailed below and hypertension who presents for follow-up of her PAD.  Patient is known to have PAD with a significantly normal ABI.  She was seen earlier in 2018 for recurrent ulceration of her bilateral legs.  She underwent lower extremity angiography which showed no significant aortoiliac disease.  The right lower extremity showed a mild diffuse SFA and popliteal disease with significant stenosis affecting the TP trunk just before the origin of the peroneal artery which was the only patent vessel below the knee.  The left lower extremity showed moderate left SFA stenosis with occluded popliteal artery and tibial peroneal vessels with only visible collaterals and short segments of reconstitution.  There were no revascularization options of the left lower extremity.  She was noted to have no targets for bypass and no options for endovascular intervention given that there was no reconstitution of her tibial peroneal vessels.  Due to worsening wound affecting the right lower extremity she underwent successful orbital atherectomy and balloon angioplasty of the right distal popliteal artery, TP trunk, and peroneal artery and 03/2017.  She was most recently seen on 08/24/2017 for routine follow-up.  At that time, it was noted her lower extremity wounds had completely healed and that she was doing well without significant pain or claudication.  It was recommended she continue medical therapy including long-term treatment with Plavix.  Repeat lower extremity arterial Doppler/duplex was recommended in 6 months time from that office visit.  Her lower extremity swelling had improved somewhat following the tapering of  amlodipine.   Past Medical History:  Diagnosis Date  . Arthritis    "maybe a little" (03/24/2017)  . Dizziness   . Hypertension   . PAD (peripheral artery disease) (HCC)     Past Surgical History:  Procedure Laterality Date  . ABDOMINAL AORTIC ANEURYSM REPAIR     hx/notes 07/26/2012  . ABDOMINAL AORTOGRAM N/A 02/24/2017   Procedure: Abdominal Aortogram;  Surgeon: Iran Ouch, MD;  Location: [facility];  Service: Cardiovascular;  Laterality: N/A;  . HERNIA REPAIR    . JOINT REPLACEMENT    . LAPAROSCOPIC INCISIONAL / UMBILICAL / VENTRAL HERNIA REPAIR  2010   VHR w/mesh hx/notes 07/26/2012  . LOWER EXTREMITY ANGIOGRAPHY Bilateral 02/24/2017   Procedure: Lower Extremity Angiography;  Surgeon: Iran Ouch, MD;  Location: [facility];  Service: Cardiovascular;  Laterality: Bilateral;  . PERIPHERAL VASCULAR ATHERECTOMY  03/24/2017  . PERIPHERAL VASCULAR ATHERECTOMY Right 03/24/2017   Procedure: Peripheral Vascular Atherectomy;  Surgeon: Iran Ouch, MD;  Location: [facility];  Service: Cardiovascular;  Laterality: Right;  . TOTAL KNEE ARTHROPLASTY Left     No outpatient medications have been marked as taking for the 05/05/18 encounter (Appointment) with Sondra Barges, PA-C.    Allergies:   Sulfa antibiotics   Social History:  The patient  reports that she has never smoked. She has never used smokeless tobacco. She reports that she does not drink alcohol or use drugs.   Family History:  The patient's family history includes Cancer in her brother; Heart attack in her father; Hypertension in her mother and sister; Stroke in her brother.  ROS:  ROS   PHYSICAL EXAM: *** VS:  There were no vitals taken for this visit. BMI: There is no height or weight on file to calculate BMI.  Physical Exam   EKG:  Was ordered and interpreted by me today. Shows ***  Recent Labs: No results found for requested labs within last 8760 hours.  No results found for  requested labs within last 8760 hours.   CrCl cannot be calculated (Patient's most recent lab result is older than the maximum 21 days allowed.).   Wt Readings from Last 3 Encounters:  08/24/17 149 lb 12 oz (67.9 kg)  04/20/17 143 lb (64.9 kg)  03/25/17 133 lb 6.1 oz (60.5 kg)     Other studies reviewed: Additional studies/records reviewed today include: summarized above  ASSESSMENT AND PLAN:  1. PAD: 2. HTN:  Disposition: F/u with *** in   Current medicines are reviewed at length with the patient today.  The patient did not have any concerns regarding medicines.  Signed, Eula Listen, PA-C 05/03/2018 2:03 PM     [facility] [address] [place], [place] [address] [phone]

## 2018-05-05 ENCOUNTER — Ambulatory Visit: Payer: Medicare Other | Admitting: Physician Assistant

## 2018-05-15 ENCOUNTER — Emergency Department: Payer: Medicare Other

## 2018-05-15 ENCOUNTER — Inpatient Hospital Stay
Admission: EM | Admit: 2018-05-15 | Discharge: 2018-05-18 | DRG: 482 | Disposition: A | Discharge status: Skilled Nursing Facility | Payer: Medicare Other | Attending: Internal Medicine | Admitting: Internal Medicine

## 2018-05-15 ENCOUNTER — Other Ambulatory Visit: Payer: Self-pay

## 2018-05-15 DIAGNOSIS — Z96652 Presence of left artificial knee joint: Secondary | ICD-10-CM | POA: Diagnosis present

## 2018-05-15 DIAGNOSIS — I480 Paroxysmal atrial fibrillation: Secondary | ICD-10-CM | POA: Diagnosis present

## 2018-05-15 DIAGNOSIS — D696 Thrombocytopenia, unspecified: Secondary | ICD-10-CM | POA: Diagnosis present

## 2018-05-15 DIAGNOSIS — I10 Essential (primary) hypertension: Secondary | ICD-10-CM | POA: Diagnosis present

## 2018-05-15 DIAGNOSIS — Z7901 Long term (current) use of anticoagulants: Secondary | ICD-10-CM | POA: Diagnosis not present

## 2018-05-15 DIAGNOSIS — I739 Peripheral vascular disease, unspecified: Secondary | ICD-10-CM | POA: Diagnosis present

## 2018-05-15 DIAGNOSIS — S72142A Displaced intertrochanteric fracture of left femur, initial encounter for closed fracture: Secondary | ICD-10-CM | POA: Diagnosis present

## 2018-05-15 DIAGNOSIS — M810 Age-related osteoporosis without current pathological fracture: Secondary | ICD-10-CM | POA: Diagnosis present

## 2018-05-15 DIAGNOSIS — Y9222 Religious institution as the place of occurrence of the external cause: Secondary | ICD-10-CM | POA: Diagnosis not present

## 2018-05-15 DIAGNOSIS — Z419 Encounter for procedure for purposes other than remedying health state, unspecified: Secondary | ICD-10-CM

## 2018-05-15 DIAGNOSIS — E785 Hyperlipidemia, unspecified: Secondary | ICD-10-CM | POA: Diagnosis present

## 2018-05-15 DIAGNOSIS — W010XXA Fall on same level from slipping, tripping and stumbling without subsequent striking against object, initial encounter: Secondary | ICD-10-CM | POA: Diagnosis present

## 2018-05-15 DIAGNOSIS — S72002A Fracture of unspecified part of neck of left femur, initial encounter for closed fracture: Secondary | ICD-10-CM | POA: Diagnosis present

## 2018-05-15 DIAGNOSIS — M25552 Pain in left hip: Secondary | ICD-10-CM | POA: Diagnosis present

## 2018-05-15 DIAGNOSIS — Z7902 Long term (current) use of antithrombotics/antiplatelets: Secondary | ICD-10-CM

## 2018-05-15 LAB — BASIC METABOLIC PANEL
Anion gap: 10 (ref 5–15)
BUN: 21 mg/dL — AB (ref 6–20)
CHLORIDE: 106 mmol/L (ref 101–111)
CO2: 21 mmol/L — AB (ref 22–32)
Calcium: 9.1 mg/dL (ref 8.9–10.3)
Creatinine, Ser: 0.87 mg/dL (ref 0.44–1.00)
GFR calc Af Amer: >60 mL/min (ref >60)
GFR calc non Af Amer: 59 mL/min — ABNORMAL LOW (ref >60)
GLUCOSE: 98 mg/dL (ref 65–99)
Potassium: 3.6 mmol/L (ref 3.5–5.1)
Sodium: 137 mmol/L (ref 135–145)

## 2018-05-15 LAB — URINALYSIS, COMPLETE (UACMP) WITH MICROSCOPIC
BACTERIA UA: NONE SEEN
BILIRUBIN URINE: NEGATIVE
Glucose, UA: NEGATIVE mg/dL
KETONES UR: NEGATIVE mg/dL
Leukocytes, UA: NEGATIVE
Nitrite: NEGATIVE
Protein, ur: NEGATIVE mg/dL
Specific Gravity, Urine: 1.015 (ref 1.005–1.030)
pH: 6 (ref 5.0–8.0)

## 2018-05-15 LAB — CBC WITH DIFFERENTIAL/PLATELET
Basophils Absolute: 0.1 10*3/uL (ref 0–0.1)
Basophils Relative: 1 %
EOS ABS: 1.1 10*3/uL — AB (ref 0–0.7)
Eosinophils Relative: 13 %
HEMATOCRIT: 38.2 % (ref 35.0–47.0)
HEMOGLOBIN: 13.2 g/dL (ref 12.0–16.0)
LYMPHS ABS: 1.1 10*3/uL (ref 1.0–3.6)
LYMPHS PCT: 13 %
MCH: 32.5 pg (ref 26.0–34.0)
MCHC: 34.6 g/dL (ref 32.0–36.0)
MCV: 93.9 fL (ref 80.0–100.0)
MONOS PCT: 6 %
Monocytes Absolute: 0.5 10*3/uL (ref 0.2–0.9)
NEUTROS ABS: 5.5 10*3/uL (ref 1.4–6.5)
NEUTROS PCT: 67 %
Platelets: 102 10*3/uL — ABNORMAL LOW (ref 150–440)
RBC: 4.07 MIL/uL (ref 3.80–5.20)
RDW: 15.3 % — ABNORMAL HIGH (ref 11.5–14.5)
WBC: 8.2 10*3/uL (ref 3.6–11.0)

## 2018-05-15 LAB — SURGICAL PCR SCREEN
MRSA, PCR: NEGATIVE
Staphylococcus aureus: NEGATIVE

## 2018-05-15 MED ORDER — HYDROCODONE-ACETAMINOPHEN 5-325 MG PO TABS
1.0000 | ORAL_TABLET | ORAL | Status: DC | PRN
  Administered 2018-05-16 – 2018-05-18 (×7): 1 via ORAL
  Filled 2018-05-15 (×7): qty 1

## 2018-05-15 MED ORDER — ACETAMINOPHEN 650 MG RE SUPP: 650.0000 mg | Freq: Four times a day (QID) | RECTAL | Status: DC | PRN

## 2018-05-15 MED ORDER — CEFAZOLIN SODIUM-DEXTROSE 1-4 GM/50ML-% IV SOLN
1.0000 g | INTRAVENOUS | Status: AC
Start: 2018-05-16 — End: 2018-05-16
  Administered 2018-05-16: 1 g via INTRAVENOUS
  Filled 2018-05-15 (×2): qty 50

## 2018-05-15 MED ORDER — FUROSEMIDE 20 MG PO TABS
10.0000 mg | ORAL_TABLET | Freq: Every day | ORAL | Status: DC
  Administered 2018-05-17 – 2018-05-18 (×2): 10 mg via ORAL
  Filled 2018-05-15 (×2): qty 1

## 2018-05-15 MED ORDER — FENTANYL CITRATE (PF) 100 MCG/2ML IJ SOLN
25.0000 ug | Freq: Once | INTRAMUSCULAR | Status: AC
  Administered 2018-05-15: 25 ug via INTRAVENOUS
  Filled 2018-05-15: qty 2

## 2018-05-15 MED ORDER — MORPHINE SULFATE (PF) 2 MG/ML IV SOLN: 2.0000 mg | INTRAVENOUS | Status: DC | PRN

## 2018-05-15 MED ORDER — ATORVASTATIN CALCIUM 20 MG PO TABS
20.0000 mg | ORAL_TABLET | Freq: Every day | ORAL | Status: DC
  Administered 2018-05-15 – 2018-05-17 (×3): 20 mg via ORAL
  Filled 2018-05-15 (×3): qty 1

## 2018-05-15 MED ORDER — VITAMIN D3 25 MCG (1000 UNIT) PO TABS
1000.0000 [IU] | ORAL_TABLET | Freq: Every day | ORAL | Status: DC
  Administered 2018-05-17 – 2018-05-18 (×2): 1000 [IU] via ORAL
  Filled 2018-05-15 (×5): qty 1

## 2018-05-15 MED ORDER — ONDANSETRON HCL 4 MG/2ML IJ SOLN
4.0000 mg | Freq: Four times a day (QID) | INTRAMUSCULAR | Status: DC | PRN
  Administered 2018-05-16: 4 mg via INTRAVENOUS

## 2018-05-15 MED ORDER — AMLODIPINE BESYLATE 5 MG PO TABS
5.0000 mg | ORAL_TABLET | Freq: Every day | ORAL | Status: DC
  Administered 2018-05-17 – 2018-05-18 (×2): 5 mg via ORAL
  Filled 2018-05-15 (×2): qty 1

## 2018-05-15 MED ORDER — ONDANSETRON HCL 4 MG PO TABS: 4.0000 mg | ORAL_TABLET | Freq: Four times a day (QID) | ORAL | Status: DC | PRN

## 2018-05-15 MED ORDER — MUPIROCIN 2 % EX OINT
1.0000 "application " | TOPICAL_OINTMENT | Freq: Two times a day (BID) | CUTANEOUS | Status: DC
  Filled 2018-05-15: qty 22

## 2018-05-15 MED ORDER — LABETALOL HCL 100 MG PO TABS
100.0000 mg | ORAL_TABLET | Freq: Two times a day (BID) | ORAL | Status: DC
  Administered 2018-05-15 – 2018-05-18 (×6): 100 mg via ORAL
  Filled 2018-05-15 (×7): qty 1

## 2018-05-15 MED ORDER — CEFAZOLIN (ANCEF) 1 G IV SOLR: 1.0000 g | INTRAVENOUS | Status: DC

## 2018-05-15 MED ORDER — POLYETHYLENE GLYCOL 3350 17 G PO PACK: 17.0000 g | PACK | Freq: Every day | ORAL | Status: DC | PRN

## 2018-05-15 MED ORDER — ACETAMINOPHEN 325 MG PO TABS
650.0000 mg | ORAL_TABLET | Freq: Four times a day (QID) | ORAL | Status: DC | PRN
  Administered 2018-05-15: 650 mg via ORAL
  Filled 2018-05-15: qty 2

## 2018-05-15 NOTE — ED Notes (Signed)
ED Provider at bedside. 

## 2018-05-15 NOTE — ED Notes (Signed)
Pt in xray

## 2018-05-15 NOTE — ED Provider Notes (Signed)
CT scan shows acute left hip fracture. Discussed with Dr. Rosita KeaMenz and patient. Will plan on admitting to the hospitalist service.    [name], Jordan Inabinet, MD 05/15/18 [phone]

## 2018-05-15 NOTE — H&P (Signed)
[employer] [facility] - [place] at [facility]   [name]    MR#:  [id number]  DATE OF BIRTH:  Jan 06, 1931  DATE OF ADMISSION:  05/15/2018  PRIMARY CARE PHYSICIAN: Guy Begin, MD   REQUESTING/REFERRING PHYSICIAN: Dr. Ileana Roup.    CHIEF COMPLAINT:   Chief Complaint  Patient presents with  . Fall    HISTORY OF PRESENT ILLNESS:  [name]  is a 84 y.o. female with a known history of essential hypertension, peripheral artery disease, osteoarthritis, history of paroxysmal atrial fibrillation who presents to the hospital after a mechanical fall and noted to have a left hip fracture.  Patient says she was at church and was attempting to get up and ambulate when she fell to the ground.  She denies any prodromal symptoms like chest pain, palpitations, shortness of breath, dizziness, numbness or tingling or any other associated symptoms prior to her fall.  She did not have a syncopal episode and she remembers her fall.  She was having difficulty using her left leg and therefore was brought to the ER for further evaluation.  Patient CT scan was suggestive of a left intertrochanteric fracture.  Hospitalist services were contacted for treatment evaluation.  PAST MEDICAL HISTORY:   Past Medical History:  Diagnosis Date  . Arthritis    "maybe a little" (03/24/2017)  . Dizziness   . Hypertension   . PAD (peripheral artery disease) (HCC)     PAST SURGICAL HISTORY:   Past Surgical History:  Procedure Laterality Date  . ABDOMINAL AORTIC ANEURYSM REPAIR     hx/notes 07/26/2012  . ABDOMINAL AORTOGRAM N/A 02/24/2017   Procedure: Abdominal Aortogram;  Surgeon: Iran Ouch, MD;  Location: [facility];  Service: Cardiovascular;  Laterality: N/A;  . HERNIA REPAIR    . JOINT REPLACEMENT    . LAPAROSCOPIC INCISIONAL / UMBILICAL / VENTRAL HERNIA REPAIR  2010   VHR w/mesh hx/notes 07/26/2012  . LOWER EXTREMITY ANGIOGRAPHY Bilateral 02/24/2017   Procedure: Lower  Extremity Angiography;  Surgeon: Iran Ouch, MD;  Location: [facility];  Service: Cardiovascular;  Laterality: Bilateral;  . PERIPHERAL VASCULAR ATHERECTOMY  03/24/2017  . PERIPHERAL VASCULAR ATHERECTOMY Right 03/24/2017   Procedure: Peripheral Vascular Atherectomy;  Surgeon: Iran Ouch, MD;  Location: [facility];  Service: Cardiovascular;  Laterality: Right;  . TOTAL KNEE ARTHROPLASTY Left     SOCIAL HISTORY:   Social History   Tobacco Use  . Smoking status: Never Smoker  . Smokeless tobacco: Never Used  Substance Use Topics  . Alcohol use: No    Alcohol/week: 0.0 oz    FAMILY HISTORY:   Family History  Problem Relation Age of Onset  . Hypertension Mother   . Heart attack Father   . Hypertension Sister   . Cancer Brother   . Stroke Brother     DRUG ALLERGIES:   Allergies  Allergen Reactions  . Sulfa Antibiotics Shortness Of Breath and Other (See Comments)    dizziness    REVIEW OF SYSTEMS:   Review of Systems  Constitutional: Negative for fever and weight loss.  HENT: Negative for congestion, nosebleeds and tinnitus.   Eyes: Negative for blurred vision, double vision and redness.  Respiratory: Negative for cough, hemoptysis and shortness of breath.   Cardiovascular: Negative for chest pain, orthopnea, leg swelling and PND.  Gastrointestinal: Negative for abdominal pain, diarrhea, melena, nausea and vomiting.  Genitourinary: Negative for dysuria, hematuria and urgency.  Musculoskeletal: Positive for  falls and joint pain (Left Hip).  Neurological: Negative for dizziness, tingling, sensory change, focal weakness, seizures, weakness and headaches.  Endo/Heme/Allergies: Negative for polydipsia. Does not bruise/bleed easily.  Psychiatric/Behavioral: Negative for depression and memory loss. The patient is not nervous/anxious.     MEDICATIONS AT HOME:   Prior to Admission medications   Medication Sig Start Date End Date Taking? Authorizing  Provider  acetaminophen (TYLENOL) 500 MG tablet Take 1,000 mg by mouth 2 (two) times daily as needed for moderate pain or headache.   Yes [provider]  amLODipine (NORVASC) 5 MG tablet Take 5 mg by mouth daily. 04/29/18  Yes [provider]  atorvastatin (LIPITOR) 20 MG tablet Take 1 tablet (20 mg total) by mouth daily at 6 PM. 03/25/17  Yes Laverda Page B, NP  cholecalciferol (VITAMIN D) 1000 units tablet Take 1,000 Units by mouth daily.    Yes [provider]  ELIQUIS 5 MG TABS tablet Take 5 mg by mouth 2 (two) times daily. 05/04/18  Yes [provider]  furosemide (LASIX) 20 MG tablet Take 10 mg by mouth daily with breakfast.  06/20/17  Yes [provider]  labetalol (NORMODYNE) 100 MG tablet Take 100 mg by mouth 2 (two) times daily.  06/15/15  Yes [provider]  triamterene-hydrochlorothiazide (MAXZIDE-25) 37.5-25 MG per tablet Take 1 tablet by mouth daily. 07/04/15  Yes [provider]  amLODipine (NORVASC) 5 MG tablet Take 1 tablet (5 mg total) by mouth daily. Patient not taking: Reported on 05/15/2018 04/20/17 07/19/17  Iran Ouch, MD  clopidogrel (PLAVIX) 75 MG tablet Take 1 tablet (75 mg total) by mouth daily. Patient not taking: Reported on 05/15/2018 03/11/17   Iran Ouch, MD  traMADol (ULTRAM) 50 MG tablet Take 1 tablet (50 mg total) by mouth 2 (two) times daily as needed for moderate pain. Patient not taking: Reported on 05/15/2018 04/20/17   Iran Ouch, MD      VITAL SIGNS:  Blood pressure (!) 151/63, pulse 67, temperature 98.5 F (36.9 C), temperature source Oral, resp. rate 12, weight 68 kg (150 lb), SpO2 98 %.  PHYSICAL EXAMINATION:  Physical Exam  GENERAL:  84 y.o.-year-old patient lying in the bed with no acute distress.  EYES: Pupils equal, round, reactive to light and accommodation. No scleral icterus. Extraocular muscles intact.  HEENT: Head atraumatic, normocephalic. Oropharynx and nasopharynx  clear. No oropharyngeal erythema, moist oral mucosa  NECK:  Supple, no jugular venous distention. No thyroid enlargement, no tenderness.  LUNGS: Normal breath sounds bilaterally, no wheezing, rales, rhonchi. No use of accessory muscles of respiration.  CARDIOVASCULAR: S1, S2 RRR. No murmurs, rubs, gallops, clicks.  ABDOMEN: Soft, nontender, nondistended. Bowel sounds present. No organomegaly or mass.  EXTREMITIES: No pedal edema, cyanosis, or clubbing. + 2 pedal & radial pulses b/l.  Left Lower Ext. Rotated and shortened due to the fracture NEUROLOGIC: Cranial nerves II through XII are intact. No focal Motor or sensory deficits appreciated b/l PSYCHIATRIC: The patient is alert and oriented x 3.  SKIN: No obvious rash, lesion, or ulcer.   LABORATORY PANEL:   CBC Recent Labs  Lab 05/15/18 1515  WBC 8.2  HGB 13.2  HCT 38.2  PLT 102*   ------------------------------------------------------------------------------------------------------------------  Chemistries  Recent Labs  Lab 05/15/18 1515  NA 137  K 3.6  CL 106  CO2 21*  GLUCOSE 98  BUN 21*  CREATININE 0.87  CALCIUM 9.1   ------------------------------------------------------------------------------------------------------------------  Cardiac Enzymes No results for input(s):  TROPONINI in the last 168 hours. ------------------------------------------------------------------------------------------------------------------  RADIOLOGY:  Ct Head Wo Contrast  Result Date: 05/15/2018 CLINICAL DATA:  84 year old who fell while at church earlier today. Initial encounter. EXAM: CT HEAD WITHOUT CONTRAST TECHNIQUE: Contiguous axial images were obtained from the base of the skull through the vertex without intravenous contrast. COMPARISON:  08/02/2015. FINDINGS: Brain: Moderate age related cortical, deep and cerebellar atrophy, unchanged. Mild-to-moderate changes of small vessel disease of the white matter diffusely with remote  lacunar strokes in the basal ganglia bilaterally, unchanged. No mass lesion. No midline shift. No acute hemorrhage or hematoma. No extra-axial fluid collections. No evidence of acute infarction. Vascular: Severe BILATERAL carotid siphon and moderate BILATERAL vertebral artery atherosclerosis. Skull: Hyperostosis frontalis interna. No skull fracture or other focal osseous abnormality involving the skull. Sinuses/Orbits: Visualized paranasal sinuses, bilateral mastoid air cells and bilateral middle ear cavities well-aerated. Visualized orbits and globes normal in appearance. Other: None. IMPRESSION: 1. No acute intracranial abnormality. 2. Stable moderate age related generalized atrophy and mild to moderate chronic microvascular ischemic changes of the white matter. Stable remote lacunar strokes in the basal ganglia bilaterally. Electronically Signed   By: Hulan Saashomas  Lawrence M.D.   On: 05/15/2018 14:31   Ct Hip Left Wo Contrast  Result Date: 05/15/2018 CLINICAL DATA:  Left hip pain due to a fall today. Initial encounter. EXAM: CT OF THE LEFT HIP WITHOUT CONTRAST TECHNIQUE: Multidetector CT imaging of the left hip was performed according to the standard protocol. Multiplanar CT image reconstructions were also generated. COMPARISON:  Plain films left hip earlier today. FINDINGS: Bones/Joint/Cartilage The patient has an acute left intertrochanteric fracture. The fracture is mildly impacted. The femoral head is located. No other fracture is identified. Bones are osteopenic. No avascular necrosis of the femoral head. Degenerative disease symphysis pubis noted. No notable degenerative change about the hip. Ligaments Intact. Muscles and Tendons Intact. Soft tissues Extensive atherosclerosis noted. IMPRESSION: Acute mildly impacted left intertrochanteric fracture. Osteopenia. Atherosclerosis. Electronically Signed   By: Drusilla Kannerhomas  Dalessio M.D.   On: 05/15/2018 15:49   Dg Chest Portable 1 View  Result Date:  05/15/2018 CLINICAL DATA:  Recent fall with left hip fracture, initial encounter EXAM: PORTABLE CHEST 1 VIEW COMPARISON:  06/28/2016 FINDINGS: Cardiac shadow is stable. Aortic calcifications are again seen. The lungs are clear. No acute bony abnormality is seen. IMPRESSION: No active disease. Electronically Signed   By: Alcide CleverMark  Lukens M.D.   On: 05/15/2018 16:21   Dg Knee Complete 4 Views Left  Result Date: 05/15/2018 CLINICAL DATA:  Recent fall with left knee pain, initial encounter EXAM: LEFT KNEE - COMPLETE 4+ VIEW COMPARISON:  None. FINDINGS: Left knee replacement is noted. No definitive fracture or dislocation is noted. Some radiopaque densities are noted overlying the anterior aspect of the knee joint. These may be related to the recent injury. Clinical correlation is recommended. No other focal abnormality is noted. IMPRESSION: No definitive fracture is seen. Radiodensities over the anterior aspect of the joint space which may be related to foreign bodies. Clinical correlation with the recent injury is recommended. Electronically Signed   By: Alcide CleverMark  Lukens M.D.   On: 05/15/2018 16:19   Dg Hip Unilat W Or Wo Pelvis 2-3 Views Left  Result Date: 05/15/2018 CLINICAL DATA:  84 year old who fell earlier today landing on the LEFT hip. LEFT hip pain. Initial encounter. EXAM: DG HIP (WITH OR WITHOUT PELVIS) 2-3V LEFT COMPARISON:  Bone window images from CT abdomen and pelvis 07/15/2015. FINDINGS: No evidence of acute fracture  or dislocation. Marked axial joint space narrowing. Osseous demineralization. Included AP pelvis demonstrates moderate axial joint space narrowing in the contralateral RIGHT hip. Sacroiliac joints and symphysis pubis intact with mild degenerative changes. Degenerative changes involving the visualized LOWER lumbar spine. Surgical mesh material overlying the UPPER pelvis. Aorto-iliofemoral atherosclerosis without evidence of aneurysm. IMPRESSION: 1. No acute osseous abnormalities. 2. Severe  osteoarthritis involving the LEFT hip. 3. Osseous demineralization. Electronically Signed   By: Hulan Saas M.D.   On: 05/15/2018 15:09     IMPRESSION AND PLAN:   84 year old female with past medical history of artery disease, paroxysmal atrial fibrillation, hypertension, osteoarthritis who presents to the hospital after a fall and noted to have a left hip fracture.  1.  Preoperative cardiovascular examination-patient is a low risk for noncardiac surgery. - No contraindications to surgery at this time.  Patient's preoperative EKG has been reviewed shows no acute ST or T wave changes. - Hold patient's Eliquis as patient likely to have surgery tomorrow.  2.  History of paroxysmal atrial fibrillation-rate controlled.  Continue labetalol. -Hold Eliquis this patient to have surgery tomorrow.  3.  Status post fall and left hip fracture- orthopedics has been consulted.  Plan for surgery for tomorrow.  4.  Hyperlipidemia-continue atorvastatin.  5.  Essential hypertension-continue Norvasc, labetalol.  Hold triamterene/HCTZ for now.  6.  Osteoporosis-continue vitamin D supplements.    All the records are reviewed and case discussed with ED provider. Management plans discussed with the patient, family and they are in agreement.  CODE STATUS: Full code  TOTAL TIME TAKING CARE OF THIS PATIENT: 40 minutes.    Houston Siren M.D on 05/15/2018 at 4:41 PM  Between 7am to 6pm - Pager - [phone]  After 6pm go to [email or web address] - password [email or web address] [facility]  [facility]  Office  [phone]  CC: Primary care physician; Guy Begin, MD

## 2018-05-15 NOTE — Consult Note (Signed)
Is a 84 year old who slipped at church earlier today suffering a left intertrochanteric hip fracture for which she is being admitted.  She denies prodromal symptoms.  She did was trying to get up and suffered a fall and may have had fractures secondary to torsion prior to her fall.  She does have significant arthritis in the hip and prior knee replacement the left  Radiographs show nondisplaced intertrochanteric CT confirming Exam her leg is slightly externally rotated without shortening she able flex extend the toes.  Impression is nondisplaced left intertrochanteric hip fracture  Recommendation is for short IM rod, would like to do that tomorrow under general anesthesia as she thrombocytopenic and with her history of A. fib want to keep her on her anticoagulation

## 2018-05-15 NOTE — ED Provider Notes (Signed)
[facility] Emergency Department Provider Note  ____________________________________________   I have reviewed the triage vital signs and the nursing notes. Where available I have reviewed prior notes and, if possible and indicated, outside hospital notes.    HISTORY  Chief Complaint Fall    HPI [name] is a 84 y.o. female who takes Plavix, was walking in church to extend welcome to someone, the church and she fell.  She believes she tripped.  She did not pass out.  She does not think she hit her head.  She has pain to left hip has not been able to bear weight since that time, no fever no chills no antecedent symptoms of any variety, felt peripherally normal before the fall.  Remembers falling. Only complaint at this time is of left hip pain hurts to range the hip nothing makes better nothing makes worse no antecedent treatment no other pathology commented upon by patient    Past Medical History:  Diagnosis Date  . Arthritis    "maybe a little" (03/24/2017)  . Dizziness   . Hypertension   . PAD (peripheral artery disease) [facility])     Patient Active Problem List   Diagnosis Date Noted  . PAD (peripheral artery disease) (HCC)   . Critical lower limb ischemia 03/24/2017  . Sepsis (HCC) 06/28/2016    Past Surgical History:  Procedure Laterality Date  . ABDOMINAL AORTIC ANEURYSM REPAIR     hx/notes 07/26/2012  . ABDOMINAL AORTOGRAM N/A 02/24/2017   Procedure: Abdominal Aortogram;  Surgeon: Iran Ouch, MD;  Location: [facility];  Service: Cardiovascular;  Laterality: N/A;  . HERNIA REPAIR    . JOINT REPLACEMENT    . LAPAROSCOPIC INCISIONAL / UMBILICAL / VENTRAL HERNIA REPAIR  2010   VHR w/mesh hx/notes 07/26/2012  . LOWER EXTREMITY ANGIOGRAPHY Bilateral 02/24/2017   Procedure: Lower Extremity Angiography;  Surgeon: Iran Ouch, MD;  Location: [facility];  Service: Cardiovascular;  Laterality: Bilateral;  . PERIPHERAL  VASCULAR ATHERECTOMY  03/24/2017  . PERIPHERAL VASCULAR ATHERECTOMY Right 03/24/2017   Procedure: Peripheral Vascular Atherectomy;  Surgeon: Iran Ouch, MD;  Location: [facility];  Service: Cardiovascular;  Laterality: Right;  . TOTAL KNEE ARTHROPLASTY Left     Prior to Admission medications   Medication Sig Start Date End Date Taking? Authorizing Provider  acetaminophen (TYLENOL) 500 MG tablet Take 1,000 mg by mouth 2 (two) times daily as needed for moderate pain or headache.    [provider]  amLODipine (NORVASC) 5 MG tablet Take 1 tablet (5 mg total) by mouth daily. 04/20/17 07/19/17  Iran Ouch, MD  atorvastatin (LIPITOR) 20 MG tablet Take 1 tablet (20 mg total) by mouth daily at 6 PM. 03/25/17   Arty Baumgartner, NP  Biotin 1000 MCG tablet Take 1,000 mcg by mouth every evening.    [provider]  cholecalciferol (VITAMIN D) 1000 units tablet Take 1,000 Units by mouth every evening.     [provider]  clopidogrel (PLAVIX) 75 MG tablet Take 1 tablet (75 mg total) by mouth daily. 03/11/17   Iran Ouch, MD  furosemide (LASIX) 20 MG tablet Take 20 mg by mouth daily with breakfast. 06/20/17   [provider]  labetalol (NORMODYNE) 100 MG tablet Take 100 mg by mouth 2 (two) times daily.  06/15/15   [provider]  traMADol (ULTRAM) 50 MG tablet Take 1 tablet (50 mg total) by mouth 2 (two) times  daily as needed for moderate pain. 04/20/17   Iran OuchArida, Muhammad A, MD  triamterene-hydrochlorothiazide (MAXZIDE-25) 37.5-25 MG per tablet Take 1 tablet by mouth daily. 07/04/15   [provider]    Allergies Sulfa antibiotics  Family History  Problem Relation Age of Onset  . Hypertension Mother   . Heart attack Father   . Hypertension Sister   . Cancer Brother   . Stroke Brother     Social History Social History   Tobacco Use  . Smoking status: Never Smoker  . Smokeless tobacco: Never Used  Substance Use Topics  .  Alcohol use: No    Alcohol/week: 0.0 oz  . Drug use: No    Review of Systems Constitutional: No fever/chills Eyes: No visual changes. ENT: No sore throat. No stiff neck no neck pain Cardiovascular: Denies chest pain. Respiratory: Denies shortness of breath. Gastrointestinal:   no vomiting.  No diarrhea.  No constipation. Genitourinary: Negative for dysuria. Musculoskeletal: Negative lower extremity swelling Skin: Negative for rash. Neurological: Negative for severe headaches, focal weakness or numbness.   ____________________________________________   PHYSICAL EXAM:  VITAL SIGNS: ED Triage Vitals  Enc Vitals Group     BP 05/15/18 1400 (!) 151/63     Pulse Rate 05/15/18 1400 67     Resp 05/15/18 1400 12     Temp 05/15/18 1400 98.5 F (36.9 C)     Temp Source 05/15/18 1400 Oral     SpO2 05/15/18 1400 98 %     Weight 05/15/18 1401 150 lb (68 kg)     Height --      Head Circumference --      Peak Flow --      Pain Score 05/15/18 1401 8     Pain Loc --      Pain Edu? --      Excl. in GC? --     Constitutional: Alert and oriented. Well appearing toxic Eyes: Conjunctivae are normal Head: Atraumatic HEENT: No congestion/rhinnorhea. Mucous membranes are moist.  Oropharynx non-erythematous Neck:   Nontender with no meningismus, no masses, no stridor Cardiovascular: Normal rate, regular rhythm. Grossly normal heart sounds.  Good peripheral circulation. Respiratory: Normal respiratory effort.  No retractions. Lungs CTAB. Abdominal: Soft and nontender. No distention. No guarding no rebound Back:  There is no focal tenderness or step off.  there is no midline tenderness there are no lesions noted. there is no CVA tenderness Musculoskeletal: Tenderness to left hip, pain with ranging of the hip, good pulses distally no knee pain or ankle pain or tenderness, no upper extremity tenderness. No joint effusions, no DVT signs strong distal pulses no edema Neurologic:  Normal speech and  language. No gross focal neurologic deficits are appreciated.  Skin:  Skin is warm, dry and intact. No rash noted. Psychiatric: Mood and affect are normal. Speech and behavior are normal.  ____________________________________________   LABS (all labs ordered are listed, but only abnormal results are displayed)  Labs Reviewed  CBC WITH DIFFERENTIAL/PLATELET  BASIC METABOLIC PANEL  URINALYSIS, COMPLETE (UACMP) WITH MICROSCOPIC    Pertinent labs  results that were available during my care of the patient were reviewed by me and considered in my medical decision making (see chart for details). ____________________________________________  EKG  I personally interpreted any EKGs ordered by me or triage   ____________________________________________  RADIOLOGY  Pertinent labs & imaging results that were available during my care of the patient were reviewed by me and considered in my medical decision making (  see chart for details). If possible, patient and/or family made aware of any abnormal findings.  No results found. ____________________________________________    PROCEDURES  Procedure(s) performed: None  Procedures  Critical Care performed: None  ____________________________________________   INITIAL IMPRESSION / ASSESSMENT AND PLAN / ED COURSE  Pertinent labs & imaging results that were available during my care of the patient were reviewed by me and considered in my medical decision making (see chart for details).  Patient here with concern for hip fracture, because she is on Plavix and is not 100% sure she did not hit her head will obtain CT head, I will obtain an x-ray of the left hip which I suspect is likely broken, neurovascular intact, non-syncopal fall.  We will start likely preop work-up in the emergency department as well and give her pain medications gingerly given her age    ____________________________________________   FINAL CLINICAL IMPRESSION(S) / ED  DIAGNOSES  Final diagnoses:  None      This chart was dictated using voice recognition software.  Despite best efforts to proofread,  errors can occur which can change meaning.      Jeanmarie Plant, MD 05/15/18 [phone]

## 2018-05-15 NOTE — ED Triage Notes (Signed)
Pt presents via EMS c/o mechanical fall at church. Denies dizziness,chest pain,SOB, or syncope during fall. Pt c/o pain in left upper thigh. Pt A&Ox4.

## 2018-05-16 ENCOUNTER — Inpatient Hospital Stay: Payer: Medicare Other | Admitting: Anesthesiology

## 2018-05-16 ENCOUNTER — Encounter
Admission: EM | Disposition: A | Discharge status: Skilled Nursing Facility | Payer: Self-pay | Source: Home / Self Care | Attending: Internal Medicine

## 2018-05-16 ENCOUNTER — Inpatient Hospital Stay: Payer: Medicare Other

## 2018-05-16 HISTORY — PX: INTRAMEDULLARY (IM) NAIL INTERTROCHANTERIC: SHX5875

## 2018-05-16 LAB — BASIC METABOLIC PANEL
Anion gap: 9 (ref 5–15)
BUN: 23 mg/dL — ABNORMAL HIGH (ref 6–20)
CHLORIDE: 105 mmol/L (ref 101–111)
CO2: 23 mmol/L (ref 22–32)
CREATININE: 0.89 mg/dL (ref 0.44–1.00)
Calcium: 9 mg/dL (ref 8.9–10.3)
GFR calc non Af Amer: 58 mL/min — ABNORMAL LOW (ref >60)
Glucose, Bld: 89 mg/dL (ref 65–99)
POTASSIUM: 3.6 mmol/L (ref 3.5–5.1)
SODIUM: 137 mmol/L (ref 135–145)

## 2018-05-16 LAB — CBC
HCT: 31.8 % — ABNORMAL LOW (ref 35.0–47.0)
Hemoglobin: 11.3 g/dL — ABNORMAL LOW (ref 12.0–16.0)
MCH: 32.8 pg (ref 26.0–34.0)
MCHC: 35.4 g/dL (ref 32.0–36.0)
MCV: 92.6 fL (ref 80.0–100.0)
PLATELETS: 99 10*3/uL — AB (ref 150–440)
RBC: 3.43 MIL/uL — AB (ref 3.80–5.20)
RDW: 15.1 % — ABNORMAL HIGH (ref 11.5–14.5)
WBC: 7.4 10*3/uL (ref 3.6–11.0)

## 2018-05-16 SURGERY — FIXATION, FRACTURE, INTERTROCHANTERIC, WITH INTRAMEDULLARY ROD
Anesthesia: General | Laterality: Left

## 2018-05-16 MED ORDER — PROMETHAZINE HCL 25 MG/ML IJ SOLN: 6.2500 mg | INTRAMUSCULAR | Status: DC | PRN

## 2018-05-16 MED ORDER — APIXABAN 5 MG PO TABS
5.0000 mg | ORAL_TABLET | Freq: Two times a day (BID) | ORAL | Status: DC
  Administered 2018-05-18: 5 mg via ORAL
  Filled 2018-05-16: qty 1

## 2018-05-16 MED ORDER — SUCCINYLCHOLINE CHLORIDE 20 MG/ML IJ SOLN
INTRAMUSCULAR | Status: DC | PRN
  Administered 2018-05-16: 100 mg via INTRAVENOUS

## 2018-05-16 MED ORDER — LIDOCAINE HCL (CARDIAC) PF 100 MG/5ML IV SOSY
PREFILLED_SYRINGE | INTRAVENOUS | Status: DC | PRN
  Administered 2018-05-16: 100 mg via INTRAVENOUS

## 2018-05-16 MED ORDER — MEPERIDINE HCL 50 MG/ML IJ SOLN: 6.2500 mg | INTRAMUSCULAR | Status: DC | PRN

## 2018-05-16 MED ORDER — MAGNESIUM HYDROXIDE 400 MG/5ML PO SUSP: 30.0000 mL | Freq: Every day | ORAL | Status: DC | PRN

## 2018-05-16 MED ORDER — ACETAMINOPHEN 160 MG/5ML PO SOLN
325.0000 mg | ORAL | Status: DC | PRN
  Filled 2018-05-16: qty 20.3

## 2018-05-16 MED ORDER — PHENOL 1.4 % MT LIQD
1.0000 | OROMUCOSAL | Status: DC | PRN
  Filled 2018-05-16: qty 177

## 2018-05-16 MED ORDER — PROPOFOL 10 MG/ML IV BOLUS
INTRAVENOUS | Status: DC | PRN
  Administered 2018-05-16: 80 mg via INTRAVENOUS

## 2018-05-16 MED ORDER — PROPOFOL 10 MG/ML IV BOLUS
INTRAVENOUS | Status: AC
  Filled 2018-05-16: qty 20

## 2018-05-16 MED ORDER — SUGAMMADEX SODIUM 200 MG/2ML IV SOLN
INTRAVENOUS | Status: DC | PRN
Start: 2018-05-16 — End: 2018-05-16
  Administered 2018-05-16: 200 mg via INTRAVENOUS

## 2018-05-16 MED ORDER — FENTANYL CITRATE (PF) 100 MCG/2ML IJ SOLN
INTRAMUSCULAR | Status: DC | PRN
  Administered 2018-05-16: 50 ug via INTRAVENOUS

## 2018-05-16 MED ORDER — FENTANYL CITRATE (PF) 100 MCG/2ML IJ SOLN
INTRAMUSCULAR | Status: AC
  Filled 2018-05-16: qty 2

## 2018-05-16 MED ORDER — METOCLOPRAMIDE HCL 5 MG/ML IJ SOLN: 5.0000 mg | Freq: Three times a day (TID) | INTRAMUSCULAR | Status: DC | PRN

## 2018-05-16 MED ORDER — CEFAZOLIN SODIUM-DEXTROSE 1-4 GM/50ML-% IV SOLN
1.0000 g | Freq: Four times a day (QID) | INTRAVENOUS | Status: AC
  Administered 2018-05-16 – 2018-05-17 (×3): 1 g via INTRAVENOUS
  Filled 2018-05-16 (×3): qty 50

## 2018-05-16 MED ORDER — MENTHOL 3 MG MT LOZG
1.0000 | LOZENGE | OROMUCOSAL | Status: DC | PRN
  Administered 2018-05-16: 3 mg via ORAL
  Filled 2018-05-16 (×2): qty 9

## 2018-05-16 MED ORDER — FENTANYL CITRATE (PF) 100 MCG/2ML IJ SOLN: 25.0000 ug | INTRAMUSCULAR | Status: DC | PRN

## 2018-05-16 MED ORDER — APIXABAN 2.5 MG PO TABS
2.5000 mg | ORAL_TABLET | Freq: Two times a day (BID) | ORAL | Status: AC
  Administered 2018-05-17 (×2): 2.5 mg via ORAL
  Filled 2018-05-16 (×2): qty 1

## 2018-05-16 MED ORDER — LACTATED RINGERS IV SOLN
INTRAVENOUS | Status: DC | PRN
  Administered 2018-05-16: 13:00:00 via INTRAVENOUS

## 2018-05-16 MED ORDER — BISACODYL 10 MG RE SUPP
10.0000 mg | Freq: Every day | RECTAL | Status: DC | PRN
  Administered 2018-05-18: 10 mg via RECTAL
  Filled 2018-05-16: qty 1

## 2018-05-16 MED ORDER — METOCLOPRAMIDE HCL 10 MG PO TABS: 5.0000 mg | ORAL_TABLET | Freq: Three times a day (TID) | ORAL | Status: DC | PRN

## 2018-05-16 MED ORDER — ROCURONIUM BROMIDE 100 MG/10ML IV SOLN
INTRAVENOUS | Status: DC | PRN
  Administered 2018-05-16: 40 mg via INTRAVENOUS
  Administered 2018-05-16: 10 mg via INTRAVENOUS

## 2018-05-16 MED ORDER — NEOMYCIN-POLYMYXIN B GU 40-200000 IR SOLN
Status: DC | PRN
  Administered 2018-05-16: 4 mL

## 2018-05-16 MED ORDER — DOCUSATE SODIUM 100 MG PO CAPS
100.0000 mg | ORAL_CAPSULE | Freq: Two times a day (BID) | ORAL | Status: DC
  Administered 2018-05-16 – 2018-05-18 (×4): 100 mg via ORAL
  Filled 2018-05-16 (×4): qty 1

## 2018-05-16 MED ORDER — MAGNESIUM CITRATE PO SOLN
1.0000 | Freq: Once | ORAL | Status: DC | PRN
  Filled 2018-05-16: qty 296

## 2018-05-16 MED ORDER — DEXAMETHASONE SODIUM PHOSPHATE 10 MG/ML IJ SOLN
INTRAMUSCULAR | Status: DC | PRN
  Administered 2018-05-16: 4 mg via INTRAVENOUS

## 2018-05-16 MED ORDER — ACETAMINOPHEN 325 MG PO TABS: 325.0000 mg | ORAL_TABLET | ORAL | Status: DC | PRN

## 2018-05-16 MED ORDER — SODIUM CHLORIDE 0.9 % IV SOLN
INTRAVENOUS | Status: DC
  Administered 2018-05-16: 17:00:00 via INTRAVENOUS
  Administered 2018-05-17: 75 mL/h via INTRAVENOUS

## 2018-05-16 MED ORDER — ALUM & MAG HYDROXIDE-SIMETH 200-200-20 MG/5ML PO SUSP: 30.0000 mL | ORAL | Status: DC | PRN

## 2018-05-16 SURGICAL SUPPLY — 29 items
BIT DRILL CANN LG 4.3MM (BIT) ×1 IMPLANT
CANISTER SUCT 1200ML W/VALVE (MISCELLANEOUS) ×3 IMPLANT
CHLORAPREP W/TINT 26ML (MISCELLANEOUS) ×3 IMPLANT
DRAPE SHEET LG 3/4 BI-LAMINATE (DRAPES) ×3 IMPLANT
DRAPE U-SHAPE 47X51 STRL (DRAPES) ×3 IMPLANT
DRILL BIT CANN LG 4.3MM (BIT) ×3
DRSG OPSITE POSTOP 3X4 (GAUZE/BANDAGES/DRESSINGS) ×9 IMPLANT
GLOVE BIOGEL PI IND STRL 9 (GLOVE) ×1 IMPLANT
GLOVE BIOGEL PI INDICATOR 9 (GLOVE) ×2
GLOVE SURG SYN 9.0  PF PI (GLOVE) ×2
GLOVE SURG SYN 9.0 PF PI (GLOVE) ×1 IMPLANT
GOWN SRG 2XL LVL 4 RGLN SLV (GOWNS) ×1 IMPLANT
GOWN STRL NON-REIN 2XL LVL4 (GOWNS) ×2
GOWN STRL REUS W/ TWL LRG LVL3 (GOWN DISPOSABLE) ×1 IMPLANT
GOWN STRL REUS W/TWL LRG LVL3 (GOWN DISPOSABLE) ×2
GUIDEPIN VERSANAIL DSP 3.2X444 (ORTHOPEDIC DISPOSABLE SUPPLIES) ×3 IMPLANT
KIT TURNOVER KIT A (KITS) ×3 IMPLANT
MAT BLUE FLOOR 46X72 FLO (MISCELLANEOUS) ×3 IMPLANT
NAIL HIP FRACT 130D 11X180 (Screw) ×3 IMPLANT
NEEDLE FILTER BLUNT 18X 1/2SAF (NEEDLE) ×2
NEEDLE FILTER BLUNT 18X1 1/2 (NEEDLE) ×1 IMPLANT
NS IRRIG 500ML POUR BTL (IV SOLUTION) ×3 IMPLANT
PACK HIP COMPR (MISCELLANEOUS) ×3 IMPLANT
SCREW BONE CORTICAL 5.0X42 (Screw) ×3 IMPLANT
SCREW LAG 10.5MMX105MM HFN (Screw) ×3 IMPLANT
STAPLER SKIN PROX 35W (STAPLE) ×3 IMPLANT
SUT VIC AB 1 CT1 36 (SUTURE) ×3 IMPLANT
SUT VIC AB 2-0 CT1 (SUTURE) ×3 IMPLANT
SYR 10ML LL (SYRINGE) ×3 IMPLANT

## 2018-05-16 NOTE — Anesthesia Post-op Follow-up Note (Signed)
Anesthesia QCDR form completed.        

## 2018-05-16 NOTE — Transfer of Care (Signed)
Immediate Anesthesia Transfer of Care Note  Patient: [name]  Procedure(s) Performed: INTRAMEDULLARY (IM) NAIL INTERTROCHANTRIC (Left )  Patient Location: PACU  Anesthesia Type:General  Level of Consciousness: drowsy  Airway & Oxygen Therapy: Patient Spontanous Breathing and Patient connected to face mask oxygen  Post-op Assessment: Report given to RN and Post -op Vital signs reviewed and stable  Post vital signs: Reviewed and stable  Last Vitals:  Vitals Value Taken Time  BP 146/76 05/16/2018  1:57 PM  Temp    Pulse 58 05/16/2018  2:01 PM  Resp 18 05/16/2018  2:01 PM  SpO2 100 % 05/16/2018  2:01 PM  Vitals shown include unvalidated device data.  Last Pain:  Vitals:   05/16/18 1208  TempSrc: Tympanic  PainSc: 0-No pain      Patients Stated Pain Goal: 1 (05/16/18 0725)  Complications: No apparent anesthesia complications

## 2018-05-16 NOTE — Clinical Social Work Note (Signed)
Clinical Social Work Assessment  Patient Details  Name: [name] MRN: [id number] Date of Birth: [birth year]  Date of referral:  05/16/18               Reason for consult:  Facility Placement                Permission sought to share information with:  Chartered certified accountant granted to share information::  Yes, Verbal Permission Granted  Name::      [name]::   [name]   Relationship::     Contact Information:     Housing/Transportation Living arrangements for the past 2 months:  [name] of Information:  Patient Patient Interpreter Needed:  None Criminal Activity/Legal Involvement Pertinent to Current Situation/Hospitalization:  No - Comment as needed Significant Relationships:  Adult Children Lives with:  Adult Children Do you feel safe going back to the place where you live?  Yes Need for family participation in patient care:  Yes (Comment)  Care giving concerns:  Patient lives in [place] with her son [name] and daughter [name].    Social Worker assessment / plan:  Holiday representative ([name]) reviewed chart and noted that patient has a hip fracture. Surgery and PT are pending. CSW met with patient alone at bedside to discuss D/C plan. Patient was alert and oriented X4 and was laying in the bed. CSW introduced self and explained role of CSW department. Patient reported that she lives in [place] with her son and daughter. Per patient she does not have a HPOA. CSW explained that after surgery PT will evaluate patient and make a recommendation of home health or SNF. Per patient she prefers to go home but is open to SNF if needed. Per patient [name] in [name] is the closest SNF to her. CSW explained that [name] will have to approve SNF. FL2 complete and faxed out. CSW will continue to follow and assist as needed.   Employment status:  Retired Nurse, adult PT Recommendations:  Not  assessed at this time Information / Referral to community resources:  [name]  Patient/Family's Response to care:  Patient is open to SNF if needed.   Patient/Family's Understanding of and Emotional Response to Diagnosis, Current Treatment, and Prognosis:  Patient was very pleasant and thanked CSW for assistance.   Emotional Assessment Appearance:  Appears stated age Attitude/Demeanor/Rapport:    Affect (typically observed):  Accepting, Adaptable, Pleasant Orientation:  Oriented to Self, Oriented to Place, Oriented to  Time, Oriented to Situation Alcohol / Substance use:  Not Applicable Psych involvement (Current and /or in the community):  No (Comment)  Discharge Needs  Concerns to be addressed:  Discharge Planning Concerns Readmission within the last 30 days:  No Current discharge risk:  Dependent with Mobility Barriers to Discharge:  Continued Medical Work up   [employer], Jordan Beets, LCSW 05/16/2018, 11:34 AM

## 2018-05-16 NOTE — Anesthesia Preprocedure Evaluation (Addendum)
Anesthesia Evaluation  Patient identified by MRN, date of birth, ID band Patient awake    Reviewed: Allergy & Precautions, H&P , NPO status , reviewed documented beta blocker date and time   Airway Mallampati: II  TM Distance: >3 FB Neck ROM: full    Dental  (+) Chipped, Poor Dentition, Missing, Edentulous Upper   Pulmonary    Pulmonary exam normal        Cardiovascular hypertension, + Peripheral Vascular Disease  Normal cardiovascular exam+ dysrhythmias Atrial Fibrillation      Neuro/Psych    GI/Hepatic neg GERD  ,  Endo/Other    Renal/GU      Musculoskeletal  (+) Arthritis ,   Abdominal   Peds  Hematology   Anesthesia Other Findings Past Medical History: No date: Arthritis     Comment:  "maybe a little" (03/24/2017) No date: Dizziness No date: Hypertension No date: PAD (peripheral artery disease) (HCC)  Past Surgical History: No date: ABDOMINAL AORTIC ANEURYSM REPAIR     Comment:  hx/notes 07/26/2012 02/24/2017: ABDOMINAL AORTOGRAM; N/A     Comment:  Procedure: Abdominal Aortogram;  Surgeon: Iran OuchMuhammad A               Arida, MD;  Location: [facility];  Service:               Cardiovascular;  Laterality: N/A; No date: HERNIA REPAIR No date: JOINT REPLACEMENT 2010: LAPAROSCOPIC INCISIONAL / UMBILICAL / VENTRAL HERNIA REPAIR     Comment:  VHR w/mesh hx/notes 07/26/2012 02/24/2017: LOWER EXTREMITY ANGIOGRAPHY; Bilateral     Comment:  Procedure: Lower Extremity Angiography;  Surgeon:               Iran OuchMuhammad A Arida, MD;  Location: [facility];                Service: Cardiovascular;  Laterality: Bilateral; 03/24/2017: PERIPHERAL VASCULAR ATHERECTOMY 03/24/2017: PERIPHERAL VASCULAR ATHERECTOMY; Right     Comment:  Procedure: Peripheral Vascular Atherectomy;  Surgeon:               Iran OuchMuhammad A Arida, MD;  Location: [facility];                Service: Cardiovascular;  Laterality: Right; No date:  TOTAL KNEE ARTHROPLASTY; Left  BMI    Body Mass Index:  24.21 kg/m      Reproductive/Obstetrics                            Anesthesia Physical Anesthesia Plan  ASA: III  Anesthesia Plan: General   Post-op Pain Management:    Induction: Intravenous  PONV Risk Score and Plan: 3 and Treatment may vary due to age or medical condition, Ondansetron and Metaclopromide  Airway Management Planned:   Additional Equipment:   Intra-op Plan:   Post-operative Plan: Extubation in OR  Informed Consent: I have reviewed the patients History and Physical, chart, labs and discussed the procedure including the risks, benefits and alternatives for the proposed anesthesia with the patient or authorized representative who has indicated his/her understanding and acceptance.   Dental Advisory Given  Plan Discussed with: CRNA  Anesthesia Plan Comments:        Anesthesia Quick Evaluation

## 2018-05-16 NOTE — Progress Notes (Signed)
Pt is A&OX4 and in no acute distress. Denies pain. Pedal pulses intact. Pt can move and has full sensation to both lower extremities. Surgical site intact. VSS. Will continue to monitor.

## 2018-05-16 NOTE — Anesthesia Procedure Notes (Signed)
Procedure Name: Intubation Date/Time: 05/16/2018 1:56 PM Performed by: Aline Brochure, CRNA Pre-anesthesia Checklist: Patient identified, Emergency Drugs available, Suction available and Patient being monitored Patient Re-evaluated:Patient Re-evaluated prior to induction Oxygen Delivery Method: Circle system utilized Preoxygenation: Pre-oxygenation with 100% oxygen Induction Type: IV induction Ventilation: Mask ventilation without difficulty Laryngoscope Size: Mac and 3 Grade View: Grade I Tube type: Oral Tube size: 7.0 mm Number of attempts: 1 Airway Equipment and Method: Stylet Placement Confirmation: ETT inserted through vocal cords under direct vision,  positive ETCO2 and breath sounds checked- equal and bilateral Secured at: 21 cm Tube secured with: Tape Dental Injury: Teeth and Oropharynx as per pre-operative assessment

## 2018-05-16 NOTE — Clinical Social Work Placement (Signed)
   CLINICAL SOCIAL WORK PLACEMENT  NOTE  Date:  05/16/2018  Patient Details  Name: Alesia BandaCecil J Waldvogel MRN: [id number] Date of Birth: 01-22-1931  Clinical Social Work is seeking post-discharge placement for this patient at the Skilled  Nursing Facility level of care (*CSW will initial, date and re-position this form in  chart as items are completed):  Yes   Patient/family provided with [facility] Clinical Social Work Department's list of facilities offering this level of care within the geographic area requested by the patient (or if unable, by the patient's family).  Yes   Patient/family informed of their freedom to choose among providers that offer the needed level of care, that participate in Medicare, Medicaid or managed care program needed by the patient, have an available bed and are willing to accept the patient.  Yes   Patient/family informed of [facility]'s ownership interest in [facility] and [facility], as well as of the fact that they are under no obligation to receive care at these facilities.  PASRR submitted to EDS on 05/16/18     PASRR number received on 05/16/18     Existing PASRR number confirmed on       FL2 transmitted to all facilities in geographic area requested by pt/family on 05/16/18     FL2 transmitted to all facilities within larger geographic area on       Patient informed that his/her managed care company has contracts with or will negotiate with certain facilities, including the following:            Patient/family informed of bed offers received.  Patient chooses bed at       Physician recommends and patient chooses bed at      Patient to be transferred to   on  .  Patient to be transferred to facility by       Patient family notified on   of transfer.  Name of family member notified:        PHYSICIAN       Additional Comment:    _______________________________________________ Norrin Shreffler, [name], LCSW 05/16/2018, 11:33 AM

## 2018-05-16 NOTE — Progress Notes (Signed)
Received a call from CCMD Luisa Hartatrick that pt's cardiac rhythm this time is 2nd degree AV block (strip saved). Pt checked, on bed with no complains and not in distress. Dr. Anne HahnWillis paged and ordered for EKG. Will continue to monitor.

## 2018-05-16 NOTE — [place] FL2 (Signed)
[place] MEDICAID FL2 LEVEL OF CARE SCREENING TOOL     IDENTIFICATION  [name] Birthdate: [birth year] Sex: female Admission Date (Current Location): 05/15/2018  [place] and [place] Number:  Chiropodist and Address:  [facility], [address], [place], [place] [address]      Provider Number: [id number]  Attending Physician Name and Address:  Auburn Bilberry, MD  Relative Name and Phone Number:       Current Level of Care: Hospital Recommended Level of Care: Skilled Nursing Facility Prior Approval Number:    Date Approved/Denied:   PASRR Number: [id number])  Discharge Plan: SNF    Current Diagnoses: Patient Active Problem List   Diagnosis Date Noted  . Closed left hip fracture (HCC) 05/15/2018  . PAD (peripheral artery disease) (HCC)   . Critical lower limb ischemia 03/24/2017  . Sepsis (HCC) 06/28/2016    Orientation RESPIRATION BLADDER Height & Weight     Self, Time, Situation, Place  Normal Continent Weight: 150 lb (68 kg) Height:     BEHAVIORAL SYMPTOMS/MOOD NEUROLOGICAL BOWEL NUTRITION STATUS      Continent Diet(NPO for surgery to be advanced. )  AMBULATORY STATUS COMMUNICATION OF NEEDS Skin   Extensive Assist Verbally Surgical wounds                       Personal Care Assistance Level of Assistance  Bathing, Feeding, Dressing Bathing Assistance: Limited assistance Feeding assistance: Independent Dressing Assistance: Limited assistance     Functional Limitations Info  Sight, Hearing, Speech Sight Info: Adequate Hearing Info: Adequate Speech Info: Adequate    SPECIAL CARE FACTORS FREQUENCY  PT (By licensed PT), OT (By licensed OT)     PT Frequency: (5) OT Frequency: (5)            Contractures      Additional Factors Info  Code Status, Allergies Code Status Info: (Full Code. ) Allergies Info: (Sulfa Antibiotics)           Current Medications (05/16/2018):  This is the  current hospital active medication list Current Facility-Administered Medications  Medication Dose Route Frequency Provider Last Rate Last Dose  . acetaminophen (TYLENOL) tablet 650 mg  650 mg Oral Q6H PRN Houston Siren, MD   650 mg at 05/15/18 1845   Or  . acetaminophen (TYLENOL) suppository 650 mg  650 mg Rectal Q6H PRN Houston Siren, MD      . amLODipine (NORVASC) tablet 5 mg  5 mg Oral Daily Houston Siren, MD   Stopped at 05/16/18 0848  . atorvastatin (LIPITOR) tablet 20 mg  20 mg Oral q1800 Houston Siren, MD   20 mg at 05/15/18 1845  . ceFAZolin (ANCEF) IVPB 1 g/50 mL premix  1 g Intravenous To OR Kennedy Bucker, MD      . cholecalciferol (VITAMIN D) tablet 1,000 Units  1,000 Units Oral Daily Houston Siren, MD   Stopped at 05/16/18 0848  . furosemide (LASIX) tablet 10 mg  10 mg Oral Q breakfast Houston Siren, MD   Stopped at 05/16/18 0756  . HYDROcodone-acetaminophen (NORCO/VICODIN) 5-325 MG per tablet 1-2 tablet  1-2 tablet Oral Q4H PRN Houston Siren, MD   1 tablet at 05/16/18 0000  . labetalol (NORMODYNE) tablet 100 mg  100 mg Oral BID Houston Siren, MD   100 mg at 05/16/18 0846  . morphine 2 MG/ML injection 2 mg  2 mg Intravenous Q4H  PRN Houston SirenSainani, Vivek J, MD      . ondansetron [facility](ZOFRAN) tablet 4 mg  4 mg Oral Q6H PRN [place] SirenSainani, Vivek J, MD       Or  . ondansetron (ZOFRAN) injection 4 mg  4 mg Intravenous Q6H PRN Sainani, Rolly PancakeVivek J, MD      . polyethylene glycol (MIRALAX / GLYCOLAX) packet 17 g  17 g Oral Daily PRN [place] SirenSainani, Vivek J, MD         Discharge Medications: Please see discharge summary for a list of discharge medications.  Relevant Imaging Results:  Relevant Lab Results:   Additional Information (SSN: [id number])  [name], [name], LCSW

## 2018-05-16 NOTE — Progress Notes (Signed)
[employer] - [place] at [facility]                                                                                                                                                                                  Patient Demographics   [name], is a 84 y.o. female, DOB - [birth year], ZOX:[id number]  Admit date - 05/15/2018   Admitting Physician [place] Siren, MD  Outpatient Primary MD for the patient is Guy Begin, MD   LOS - 1  Subjective: Patient admitted with hip fracture currently states pain under control Awaiting surgery Denies any chest pain or shortness of breath  Review of Systems:   CONSTITUTIONAL: No documented fever. No fatigue, weakness. No weight gain, no weight loss.  EYES: No blurry or double vision.  ENT: No tinnitus. No postnasal drip. No redness of the oropharynx.  RESPIRATORY: No cough, no wheeze, no hemoptysis. No dyspnea.  CARDIOVASCULAR: No chest pain. No orthopnea. No palpitations. No syncope.  GASTROINTESTINAL: No nausea, no vomiting or diarrhea. No abdominal pain. No melena or hematochezia.  GENITOURINARY: No dysuria or hematuria.  ENDOCRINE: No polyuria or nocturia. No heat or cold intolerance.  HEMATOLOGY: No anemia. No bruising. No bleeding.  INTEGUMENTARY: No rashes. No lesions.  MUSCULOSKELETAL: No arthritis. No swelling. No gout.  NEUROLOGIC: No numbness, tingling, or ataxia. No seizure-type activity.  PSYCHIATRIC: No anxiety. No insomnia. No ADD.    Vitals:   Vitals:   05/16/18 1412 05/16/18 1427 05/16/18 1442 05/16/18 1511  BP: (!) 121/59 135/84 (!) 152/67 132/66  Pulse: (!) 58 (!) 58 60 60  Resp: 12 16 10 18   Temp:   98.4 F (36.9 C) (!) 97.5 F (36.4 C)  TempSrc:    Oral  SpO2: 98% 100% 99% 100%  Weight:        Wt Readings from Last 3 Encounters:  05/15/18 68 kg (150 lb)  08/24/17 67.9 kg (149 lb 12 oz)  04/20/17 64.9 kg (143 lb)     Intake/Output Summary (Last 24 hours) at 05/16/2018 1515 Last data  filed at 05/16/2018 1359 Gross per 24 hour  Intake 350 ml  Output 875 ml  Net -525 ml    Physical Exam:   GENERAL: Pleasant-appearing in no apparent distress.  HEAD, EYES, EARS, NOSE AND THROAT: Atraumatic, normocephalic. Extraocular muscles are intact. Pupils equal and reactive to light. Sclerae anicteric. No conjunctival injection. No oro-pharyngeal erythema.  NECK: Supple. There is no jugular venous distention. No bruits, no lymphadenopathy, no thyromegaly.  HEART: Regular rate and rhythm,. No murmurs, no rubs, no clicks.  LUNGS: Clear to auscultation bilaterally. No rales or rhonchi. No wheezes.  ABDOMEN: Soft, flat, nontender,  nondistended. Has good bowel sounds. No hepatosplenomegaly appreciated.  EXTREMITIES: No evidence of any cyanosis, clubbing, or peripheral edema.  +2 pedal and radial pulses bilaterally.  NEUROLOGIC: The patient is alert, awake, and oriented x3 with no focal motor or sensory deficits appreciated bilaterally.  SKIN: Moist and warm with no rashes appreciated.  Psych: Not anxious, depressed LN: No inguinal LN enlargement    Antibiotics   Anti-infectives (From admission, onward)   Start     Dose/Rate Route Frequency Ordered Stop   05/16/18 1800  ceFAZolin (ANCEF) IVPB 1 g/50 mL premix     1 g 100 mL/hr over 30 Minutes Intravenous Every 6 hours 05/16/18 1509 05/17/18 1159   05/16/18 1200  ceFAZolin (ANCEF) powder 1 g  Status:  Discontinued     1 g Other To Surgery 05/15/18 1636 05/15/18 1839   05/16/18 1200  ceFAZolin (ANCEF) IVPB 1 g/50 mL premix     1 g 100 mL/hr over 30 Minutes Intravenous To Surgery 05/15/18 1839 05/16/18 1300      Medications   Scheduled Meds: . amLODipine  5 mg Oral Daily  . [START ON 05/17/2018] apixaban  2.5 mg Oral BID  . atorvastatin  20 mg Oral q1800  . cholecalciferol  1,000 Units Oral Daily  . docusate sodium  100 mg Oral BID  . furosemide  10 mg Oral Q breakfast  . labetalol  100 mg Oral BID   Continuous Infusions: .  sodium chloride    .  ceFAZolin (ANCEF) IV     PRN Meds:.acetaminophen **OR** acetaminophen, alum & mag hydroxide-simeth, bisacodyl, HYDROcodone-acetaminophen, magnesium citrate, magnesium hydroxide, menthol-cetylpyridinium **OR** phenol, metoCLOPramide **OR** metoCLOPramide (REGLAN) injection, morphine injection, ondansetron **OR** ondansetron (ZOFRAN) IV, polyethylene glycol   Data Review:   Micro Results Recent Results (from the past 240 hour(s))  Surgical PCR screen     Status: None   Collection Time: 05/15/18  6:58 PM  Result Value Ref Range Status   MRSA, PCR NEGATIVE NEGATIVE Final   Staphylococcus aureus NEGATIVE NEGATIVE Final    Comment: (NOTE) The Xpert SA Assay (FDA approved for NASAL specimens in patients 73 years of age and older), is one component of a comprehensive surveillance program. It is not intended to diagnose infection nor to guide or monitor treatment. Performed at [facility], [address] [place], [place] [address]     Radiology Reports Ct Head Wo Contrast  Result Date: 05/15/2018 CLINICAL DATA:  84 year old who fell while at church earlier today. Initial encounter. EXAM: CT HEAD WITHOUT CONTRAST TECHNIQUE: Contiguous axial images were obtained from the base of the skull through the vertex without intravenous contrast. COMPARISON:  08/02/2015. FINDINGS: Brain: Moderate age related cortical, deep and cerebellar atrophy, unchanged. Mild-to-moderate changes of small vessel disease of the white matter diffusely with remote lacunar strokes in the basal ganglia bilaterally, unchanged. No mass lesion. No midline shift. No acute hemorrhage or hematoma. No extra-axial fluid collections. No evidence of acute infarction. Vascular: Severe BILATERAL carotid siphon and moderate BILATERAL vertebral artery atherosclerosis. Skull: Hyperostosis frontalis interna. No skull fracture or other focal osseous abnormality involving the skull. Sinuses/Orbits: Visualized  paranasal sinuses, bilateral mastoid air cells and bilateral middle ear cavities well-aerated. Visualized orbits and globes normal in appearance. Other: None. IMPRESSION: 1. No acute intracranial abnormality. 2. Stable moderate age related generalized atrophy and mild to moderate chronic microvascular ischemic changes of the white matter. Stable remote lacunar strokes in the basal ganglia bilaterally. Electronically Signed   By: Hulan Saas  M.D.   On: 05/15/2018 14:31   Ct Hip Left Wo Contrast  Result Date: 05/15/2018 CLINICAL DATA:  Left hip pain due to a fall today. Initial encounter. EXAM: CT OF THE LEFT HIP WITHOUT CONTRAST TECHNIQUE: Multidetector CT imaging of the left hip was performed according to the standard protocol. Multiplanar CT image reconstructions were also generated. COMPARISON:  Plain films left hip earlier today. FINDINGS: Bones/Joint/Cartilage The patient has an acute left intertrochanteric fracture. The fracture is mildly impacted. The femoral head is located. No other fracture is identified. Bones are osteopenic. No avascular necrosis of the femoral head. Degenerative disease symphysis pubis noted. No notable degenerative change about the hip. Ligaments Intact. Muscles and Tendons Intact. Soft tissues Extensive atherosclerosis noted. IMPRESSION: Acute mildly impacted left intertrochanteric fracture. Osteopenia. Atherosclerosis. Electronically Signed   By: Drusilla Kanner M.D.   On: 05/15/2018 15:49   Dg Chest Portable 1 View  Result Date: 05/15/2018 CLINICAL DATA:  Recent fall with left hip fracture, initial encounter EXAM: PORTABLE CHEST 1 VIEW COMPARISON:  06/28/2016 FINDINGS: Cardiac shadow is stable. Aortic calcifications are again seen. The lungs are clear. No acute bony abnormality is seen. IMPRESSION: No active disease. Electronically Signed   By: Alcide Clever M.D.   On: 05/15/2018 16:21   Dg Knee Complete 4 Views Left  Result Date: 05/15/2018 CLINICAL DATA:  Recent fall  with left knee pain, initial encounter EXAM: LEFT KNEE - COMPLETE 4+ VIEW COMPARISON:  None. FINDINGS: Left knee replacement is noted. No definitive fracture or dislocation is noted. Some radiopaque densities are noted overlying the anterior aspect of the knee joint. These may be related to the recent injury. Clinical correlation is recommended. No other focal abnormality is noted. IMPRESSION: No definitive fracture is seen. Radiodensities over the anterior aspect of the joint space which may be related to foreign bodies. Clinical correlation with the recent injury is recommended. Electronically Signed   By: Alcide Clever M.D.   On: 05/15/2018 16:19   Dg Hip Operative Unilat W Or W/o Pelvis Left  Result Date: 05/16/2018 CLINICAL DATA:  No placement for fracture EXAM: OPERATIVE LEFT HIP    2 VIEWS TECHNIQUE: Fluoroscopic spot image(s) were submitted for interpretation post-operatively. COMPARISON:  CT left hip May 15, 2018 FLUOROSCOPY TIME:  0 minutes 36 seconds; 5 acquired images FINDINGS: Frontal and lateral views obtained. There is screw and nail fixation through an intertrochanteric femur fracture. Alignment anatomic at the fracture site. The tip of the screw is in the proximal femoral head. No new fracture. No dislocation. There is mild narrowing of the left hip joint. IMPRESSION: Screw and nail fixation for intertrochanteric fracture. Alignment anatomic. Tip of screw in proximal femoral head. Mild narrowing left hip joint. No new fracture. No dislocation. Electronically Signed   By: Bretta Bang III M.D.   On: 05/16/2018 13:47   Dg Hip Unilat W Or Wo Pelvis 2-3 Views Left  Result Date: 05/15/2018 CLINICAL DATA:  84 year old who fell earlier today landing on the LEFT hip. LEFT hip pain. Initial encounter. EXAM: DG HIP (WITH OR WITHOUT PELVIS) 2-3V LEFT COMPARISON:  Bone window images from CT abdomen and pelvis 07/15/2015. FINDINGS: No evidence of acute fracture or dislocation. Marked axial joint space  narrowing. Osseous demineralization. Included AP pelvis demonstrates moderate axial joint space narrowing in the contralateral RIGHT hip. Sacroiliac joints and symphysis pubis intact with mild degenerative changes. Degenerative changes involving the visualized LOWER lumbar spine. Surgical mesh material overlying the UPPER pelvis. Aorto-iliofemoral atherosclerosis without evidence  of aneurysm. IMPRESSION: 1. No acute osseous abnormalities. 2. Severe osteoarthritis involving the LEFT hip. 3. Osseous demineralization. Electronically Signed   By: Hulan Saas M.D.   On: 05/15/2018 15:09     CBC Recent Labs  Lab 05/15/18 1515 05/16/18 0312  WBC 8.2 7.4  HGB 13.2 11.3*  HCT 38.2 31.8*  PLT 102* 99*  MCV 93.9 92.6  MCH 32.5 32.8  MCHC 34.6 35.4  RDW 15.3* 15.1*  LYMPHSABS 1.1  --   MONOABS 0.5  --   EOSABS 1.1*  --   BASOSABS 0.1  --     Chemistries  Recent Labs  Lab 05/15/18 1515 05/16/18 0312  NA 137 137  K 3.6 3.6  CL 106 105  CO2 21* 23  GLUCOSE 98 89  BUN 21* 23*  CREATININE 0.87 0.89  CALCIUM 9.1 9.0   ------------------------------------------------------------------------------------------------------------------ CrCl cannot be calculated (Unknown ideal weight.). ------------------------------------------------------------------------------------------------------------------ No results for input(s): HGBA1C in the last 72 hours. ------------------------------------------------------------------------------------------------------------------ No results for input(s): CHOL, HDL, LDLCALC, TRIG, CHOLHDL, LDLDIRECT in the last 72 hours. ------------------------------------------------------------------------------------------------------------------ No results for input(s): TSH, T4TOTAL, T3FREE, THYROIDAB in the last 72 hours.  Invalid input(s): FREET3 ------------------------------------------------------------------------------------------------------------------ No  results for input(s): VITAMINB12, FOLATE, FERRITIN, TIBC, IRON, RETICCTPCT in the last 72 hours.  Coagulation profile No results for input(s): INR, PROTIME in the last 168 hours.  No results for input(s): DDIMER in the last 72 hours.  Cardiac Enzymes No results for input(s): CKMB, TROPONINI, MYOGLOBIN in the last 168 hours.  Invalid input(s): CK ------------------------------------------------------------------------------------------------------------------ Invalid input(s): POCBNP    Assessment & Plan   84 year old female with past medical history of artery disease, paroxysmal atrial fibrillation, hypertension, osteoarthritis who presents to the hospital after a fall and noted to have a left hip fracture.  1.  Preoperative cardiovascular examination-patient is a low risk for noncardiac surgery. - No contraindications to surgery at this time.  Continue to hold hold patient's Eliquis   2.  History of paroxysmal atrial fibrillation-rate controlled.  Continue labetalol. -Resume Eliquis postop.  3.  Status post fall and left hip fracture- orthopedics has been consulted.    Plan for surgery later today  4.  Hyperlipidemia-continue atorvastatin.  5.  Essential hypertension- blood pressure stable continue Norvasc, labetalol.  Hold triamterene/HCTZ for now.  6.  Osteoporosis-continue vitamin D supplements.       Code Status Orders  (From admission, onward)        Start     Ordered   05/15/18 1749  Full code  Continuous     05/15/18 1749    Code Status History    Date Active Date Inactive Code Status Order ID Comments User Context   03/24/2017 1454 03/25/2017 1638 Full Code [id number]  Iran Ouch, MD Inpatient   02/24/2017 1234 02/24/2017 1936 Full Code [id number]  Iran Ouch, MD Inpatient   06/28/2016 2128 06/30/2016 2026 DNR [id number]  [place] Siren, MD Inpatient    Advance Directive Documentation     Most Recent Value  Type of Advance Directive   Healthcare Power of Attorney, Living will  Pre-existing out of facility DNR order (yellow form or pink MOST form)  -  "MOST" Form in Place?  -           Consults orthopedic surgery  DVT Prophylaxis awaiting surgery Lab Results  Component Value Date   PLT 99 (L) 05/16/2018     Time Spent in minutes   35 minutes greater than 50% of time spent in  care coordination and counseling patient regarding the condition and plan of care.   Auburn BilberryShreyang Kaisyn Reinhold M.D on 05/16/2018 at 3:15 PM  Between 7am to 6pm - Pager - [phone]  After 6pm go to [email or web address] - Social research officer, governmentpassword [email or web address]  [employer]   Office  [phone]

## 2018-05-16 NOTE — Op Note (Signed)
05/15/2018 - 05/16/2018  1:50 PM  PATIENT:  [name]  84 y.o. female  PRE-OPERATIVE DIAGNOSIS:  left femur fracture intertrochanteric  POST-OPERATIVE DIAGNOSIS:  left femur fracture same  PROCEDURE:  Procedure(s): INTRAMEDULLARY (IM) NAIL INTERTROCHANTRIC (Left)  SURGEON: Leitha SchullerMichael J Laurice Iglesia, MD  ASSISTANTS: None  ANESTHESIA:   general  EBL:  No intake/output data recorded.  BLOOD ADMINISTERED:none  DRAINS: none   LOCAL MEDICATIONS USED:  NONE  SPECIMEN:  No Specimen  DISPOSITION OF SPECIMEN:  N/A  COUNTS:  YES  TOURNIQUET:  * No tourniquets in log *  IMPLANTS: Biomet affixes 130 degrees 11 x 180 short nail with 105 mm leg screw, 42 mm distal interlocking screw  DICTATION: .Dragon Dictation patient brought the operating room and after adequate anesthesia was obtained the patient was transferred.  The left leg was placed in the traction boot right leg in a well-leg holder.  Anatomic alignment was maintained for preop x-ray.  The hip was prepped and draped in the bariatric method and appropriate patient identification and timeout procedures were completed.  A small incision was made approximately per trochanter and a guidewire inserted into the proximal fragment with proximal reaming carried out.  The rod was then inserted to the appropriate depth and the guidewire inserted in near center center position of the femoral head.  Measurement was made off of this followed by reaming placing under 5 mm leg screw compressing the leg screw with traction released and then tightening the set screw proximally with a quarter turn to allow for further compression.  The distal interlocking screw was then placed using the drill sleeve drilling measurement made off the drill and the 42 mm screw inserted it was the appropriate length.  Permanent AP lateral images proximally were obtained as well as a distal AP image showing good compression at the fracture site.  Wounds are irrigated with #1 Vicryl to  close the deep fascia approximately 2-0 Vicryl subcu using skin staples followed by Xeroform and honeycomb dressing  PLAN OF CARE: Continue as inpatient  PATIENT DISPOSITION:  PACU - hemodynamically stable.

## 2018-05-17 ENCOUNTER — Encounter: Payer: Self-pay | Admitting: Orthopedic Surgery

## 2018-05-17 LAB — CBC
HCT: 31.4 % — ABNORMAL LOW (ref 35.0–47.0)
Hemoglobin: 10.9 g/dL — ABNORMAL LOW (ref 12.0–16.0)
MCH: 32.5 pg (ref 26.0–34.0)
MCHC: 34.7 g/dL (ref 32.0–36.0)
MCV: 93.6 fL (ref 80.0–100.0)
PLATELETS: 98 10*3/uL — AB (ref 150–440)
RBC: 3.36 MIL/uL — AB (ref 3.80–5.20)
RDW: 15.1 % — ABNORMAL HIGH (ref 11.5–14.5)
WBC: 8.7 10*3/uL (ref 3.6–11.0)

## 2018-05-17 LAB — BASIC METABOLIC PANEL
Anion gap: 9 (ref 5–15)
BUN: 20 mg/dL (ref 6–20)
CHLORIDE: 106 mmol/L (ref 101–111)
CO2: 20 mmol/L — ABNORMAL LOW (ref 22–32)
Calcium: 8.8 mg/dL — ABNORMAL LOW (ref 8.9–10.3)
Creatinine, Ser: 0.97 mg/dL (ref 0.44–1.00)
GFR calc Af Amer: >60 mL/min (ref >60)
GFR calc non Af Amer: 52 mL/min — ABNORMAL LOW (ref >60)
Glucose, Bld: 131 mg/dL — ABNORMAL HIGH (ref 65–99)
POTASSIUM: 3.9 mmol/L (ref 3.5–5.1)
SODIUM: 135 mmol/L (ref 135–145)

## 2018-05-17 NOTE — Evaluation (Signed)
Physical Therapy Evaluation Patient Details Name: [name] MRN: [id number] DOB: [birth year] Today's Date: 05/17/2018   History of Present Illness  84 y/o F with PMH: arthritis, HTN, dizziness, and PAD. Pt presented s/p fall at church for which she sustained a L hip fx. s/p L IM nailing 05/16/18.  Clinical Impression  Pt showed good effort with PT exam, but overall did not have a lot of success with mobility and general activity.  She was unable to tolerate getting weight forward and achieve standing, but was eager to try.  She had pain with light supine exercises (~10 minutes apart from exam) and generally was very limited with pain and weakness.  Pt was hopeful to go home, but clearly will need short term rehab on discharge.    Follow Up Recommendations SNF    Equipment Recommendations       Recommendations for Other Services       Precautions / Restrictions Precautions Precautions: Fall Restrictions Weight Bearing Restrictions: Yes LLE Weight Bearing: Weight bearing as tolerated      Mobility  Bed Mobility Overal bed mobility: Modified Independent;Needs Assistance Bed Mobility: Supine to Sit     Supine to sit: Max assist     General bed mobility comments: Pt showed good effort but was highly limited with getting to sittin EOB.  Pt indicated that she thought she could do more than she was actually able to do... needed assist to get LEs toward EOB, to raise trunk, to scoot to EOB and generally just to achieve/maintain sitting posture.  Transfers Overall transfer level: Needs assistance Equipment used: Rolling walker (2 wheeled) Transfers: Sit to/from Stand Sit to Stand: Max assist         General transfer comment: Pt struggled with WBing on L and could not shift hips forward to take weight overtop of walker.  She needed heavy assist just to remain upright, unable to support herself on either attempt  Ambulation/Gait             General Gait Details: Pt  essentially unable to take a step w/o heavy direct assist from PT to "push" leg.  She was unable to maintain sitting and needed heavy assist to transfer to recliner  Stairs            Wheelchair Mobility    Modified Rankin (Stroke Patients Only)       Balance Overall balance assessment: Needs assistance Sitting-balance support: Bilateral upper extremity supported Sitting balance-Leahy Scale: Poor Sitting balance - Comments: w/o back support and with c/o signficant pain with pressure through L hip she needed assist just to remain upright (leaning down to R)   Standing balance support: Bilateral upper extremity supported Standing balance-Leahy Scale: Zero Standing balance comment: Pt completely reliant on PT to keep her somewhat upright.  Overall did not show ability to take much weight through L hip                             Pertinent Vitals/Pain Pain Assessment: 0-10 Pain Score: 5  Pain Location: L hip Pain Descriptors / Indicators: Aching Pain Intervention(s): Limited activity within patient's tolerance    Home Living Family/patient expects to be discharged to:: Skilled nursing facility Living Arrangements: Children Available Help at Discharge: Family Type of Home: House Home Access: Stairs to enter   Secretary/administrator of Steps: 3 Home Layout: One level Home Equipment: Environmental consultant - 2 wheels;Cane - single point  Prior Function Level of Independence: Independent with assistive device(s)         Comments: Pt could manage most light in-home activities, would occasionally use ADs, often cruising on furniture     Hand Dominance   Dominant Hand: Right    Extremity/Trunk Assessment   Upper Extremity Assessment Upper Extremity Assessment: Generalized weakness(signficant OA in shoulders)    Lower Extremity Assessment Lower Extremity Assessment: (expected pain/weakness post-op)    Cervical / Trunk Assessment Cervical / Trunk Assessment:  Normal  Communication   Communication: No difficulties  Cognition Arousal/Alertness: Awake/alert Behavior During Therapy: WFL for tasks assessed/performed Overall Cognitive Status: Within Functional Limits for tasks assessed                                 General Comments: Pt slow to answer at times, showed awareness of situation but overall did have some mild confusion      General Comments      Exercises General Exercises - Lower Extremity Ankle Circles/Pumps: AROM;10 reps Quad Sets: Strengthening;5 reps Gluteal Sets: Strengthening;5 reps Heel Slides: AAROM;5 reps Hip ABduction/ADduction: AAROM;10 reps Other Exercises Other Exercises: Education provided about use of LE AE for dressing, sequencing and safe hand/foot placement for sit<>stand trasnfers, Pt verbalized understanding, but requires 50% assitance on return demonstration.    Assessment/Plan    PT Assessment Patient needs continued PT services  PT Problem List Decreased strength;Decreased range of motion;Decreased activity tolerance;Decreased balance;Decreased mobility;Decreased coordination;Decreased cognition;Decreased knowledge of use of DME;Decreased safety awareness;Pain       PT Treatment Interventions DME instruction;Gait training;Stair training;Functional mobility training;Therapeutic activities;Therapeutic exercise;Balance training;Neuromuscular re-education;Cognitive remediation;Patient/family education    PT Goals (Current goals can be found in the Care Plan section)  Acute Rehab PT Goals Patient Stated Goal: get walking PT Goal Formulation: With patient Time For Goal Achievement: 05/31/18 Potential to Achieve Goals: Good    Frequency BID   Barriers to discharge        Co-evaluation               AM-PAC PT "6 Clicks" Daily Activity  Outcome Measure Difficulty turning over in bed (including adjusting bedclothes, sheets and blankets)?: Unable Difficulty moving from lying on  back to sitting on the side of the bed? : Unable Difficulty sitting down on and standing up from a chair with arms (e.g., wheelchair, bedside commode, etc,.)?: Unable Help needed moving to and from a bed to chair (including a wheelchair)?: Total Help needed walking in hospital room?: Total Help needed climbing 3-5 steps with a railing? : Total 6 Click Score: 6    End of Session Equipment Utilized During Treatment: Gait belt Activity Tolerance: Patient limited by pain Patient left: with chair alarm set;with call bell/phone within reach;with family/visitor present Nurse Communication: Mobility status PT Visit Diagnosis: Muscle weakness (generalized) (M62.81);Difficulty in walking, not elsewhere classified (R26.2)    Time: [id number] PT Time Calculation (min) (ACUTE ONLY): 30 min   Charges:   PT Evaluation $PT Eval Low Complexity: 1 Low PT Treatments $Therapeutic Exercise: 8-22 mins   PT G Codes:        Malachi ProGalen R Shannen [place]ans[place], DPT 05/17/2018, 11:43 AM

## 2018-05-17 NOTE — Progress Notes (Signed)
[employer] - [place] [facility]                                                                                                                                                                                  Patient Demographics   Jordan LameCecil Hopkins, is a 84 y.o. female, DOB - 02/17/1931, ZOX:[id number]  Admit date - 05/15/2018   Admitting Physician Houston SirenVivek J Sainani, MD  Outpatient Primary MD for the patient is [name], Narges, MD   LOS - 2  Subjective: Patient had surgery yesterday Sitting at the side of the bed   Review of Systems:   CONSTITUTIONAL: No documented fever. No fatigue, weakness. No weight gain, no weight loss.  EYES: No blurry or double vision.  ENT: No tinnitus. No postnasal drip. No redness of the oropharynx.  RESPIRATORY: No cough, no wheeze, no hemoptysis. No dyspnea.  CARDIOVASCULAR: No chest pain. No orthopnea. No palpitations. No syncope.  GASTROINTESTINAL: No nausea, no vomiting or diarrhea. No abdominal pain. No melena or hematochezia.  GENITOURINARY: No dysuria or hematuria.  ENDOCRINE: No polyuria or nocturia. No heat or cold intolerance.  HEMATOLOGY: No anemia. No bruising. No bleeding.  INTEGUMENTARY: No rashes. No lesions.  MUSCULOSKELETAL: No arthritis. No swelling. No gout.  NEUROLOGIC: No numbness, tingling, or ataxia. No seizure-type activity.  PSYCHIATRIC: No anxiety. No insomnia. No ADD.    Vitals:   Vitals:   05/17/18 0900 05/17/18 1015 05/17/18 1016 05/17/18 1150  BP:  (!) 165/78 (!) 165/78 (!) 144/69  Pulse:   70 70  Resp:      Temp:    98.2 F (36.8 C)  TempSrc:    Oral  SpO2:    97%  Weight:      Height: 5\' 5"  (1.651 m)       Wt Readings from Last 3 Encounters:  05/15/18 68 kg (150 lb)  08/24/17 67.9 kg (149 lb 12 oz)  04/20/17 64.9 kg (143 lb)     Intake/Output Summary (Last 24 hours) at 05/17/2018 1326 Last data filed at 05/17/2018 0514 Gross per 24 hour  Intake 1167.5 ml  Output 825 ml  Net 342.5 ml     Physical Exam:   GENERAL: Pleasant-appearing in no apparent distress.  HEAD, EYES, EARS, NOSE AND THROAT: Atraumatic, normocephalic. Extraocular muscles are intact. Pupils equal and reactive to light. Sclerae anicteric. No conjunctival injection. No oro-pharyngeal erythema.  NECK: Supple. There is no jugular venous distention. No bruits, no lymphadenopathy, no thyromegaly.  HEART: Regular rate and rhythm,. No murmurs, no rubs, no clicks.  LUNGS: Clear to auscultation bilaterally. No rales or rhonchi. No wheezes.  ABDOMEN: Soft, flat, nontender, nondistended. Has good bowel  sounds. No hepatosplenomegaly appreciated.  EXTREMITIES: No evidence of any cyanosis, clubbing, or peripheral edema.  +2 pedal and radial pulses bilaterally.  NEUROLOGIC: The patient is alert, awake, and oriented x3 with no focal motor or sensory deficits appreciated bilaterally.  SKIN: Moist and warm with no rashes appreciated.  Psych: Not anxious, depressed LN: No inguinal LN enlargement    Antibiotics   Anti-infectives (From admission, onward)   Start     Dose/Rate Route Frequency Ordered Stop   05/16/18 1800  ceFAZolin (ANCEF) IVPB 1 g/50 mL premix     1 g 100 mL/hr over 30 Minutes Intravenous Every 6 hours 05/16/18 1509 05/17/18 0630   05/16/18 1200  ceFAZolin (ANCEF) powder 1 g  Status:  Discontinued     1 g Other To Surgery 05/15/18 1636 05/15/18 1839   05/16/18 1200  ceFAZolin (ANCEF) IVPB 1 g/50 mL premix     1 g 100 mL/hr over 30 Minutes Intravenous To Surgery 05/15/18 1839 05/16/18 1300      Medications   Scheduled Meds: . amLODipine  5 mg Oral Daily  . apixaban  2.5 mg Oral BID  . [START ON 05/18/2018] apixaban  5 mg Oral BID  . atorvastatin  20 mg Oral q1800  . cholecalciferol  1,000 Units Oral Daily  . docusate sodium  100 mg Oral BID  . furosemide  10 mg Oral Q breakfast  . labetalol  100 mg Oral BID   Continuous Infusions: . sodium chloride 75 mL/hr (05/17/18 0556)   PRN  Meds:.acetaminophen **OR** acetaminophen, alum & mag hydroxide-simeth, bisacodyl, HYDROcodone-acetaminophen, magnesium citrate, magnesium hydroxide, menthol-cetylpyridinium **OR** phenol, metoCLOPramide **OR** metoCLOPramide (REGLAN) injection, morphine injection, ondansetron **OR** ondansetron (ZOFRAN) IV, polyethylene glycol   Data Review:   Micro Results Recent Results (from the past 240 hour(s))  Surgical PCR screen     Status: None   Collection Time: 05/15/18  6:58 PM  Result Value Ref Range Status   MRSA, PCR NEGATIVE NEGATIVE Final   Staphylococcus aureus NEGATIVE NEGATIVE Final    Comment: (NOTE) The Xpert SA Assay (FDA approved for NASAL specimens in patients 61 years of age and older), is one component of a comprehensive surveillance program. It is not intended to diagnose infection nor to guide or monitor treatment. Performed at [facility], [address] [place], [place] [address]     Radiology Reports Ct Head Wo Contrast  Result Date: 05/15/2018 CLINICAL DATA:  84 year old who fell while at church earlier today. Initial encounter. EXAM: CT HEAD WITHOUT CONTRAST TECHNIQUE: Contiguous axial images were obtained from the base of the skull through the vertex without intravenous contrast. COMPARISON:  08/02/2015. FINDINGS: Brain: Moderate age related cortical, deep and cerebellar atrophy, unchanged. Mild-to-moderate changes of small vessel disease of the white matter diffusely with remote lacunar strokes in the basal ganglia bilaterally, unchanged. No mass lesion. No midline shift. No acute hemorrhage or hematoma. No extra-axial fluid collections. No evidence of acute infarction. Vascular: Severe BILATERAL carotid siphon and moderate BILATERAL vertebral artery atherosclerosis. Skull: Hyperostosis frontalis interna. No skull fracture or other focal osseous abnormality involving the skull. Sinuses/Orbits: Visualized paranasal sinuses, bilateral mastoid air cells and  bilateral middle ear cavities well-aerated. Visualized orbits and globes normal in appearance. Other: None. IMPRESSION: 1. No acute intracranial abnormality. 2. Stable moderate age related generalized atrophy and mild to moderate chronic microvascular ischemic changes of the white matter. Stable remote lacunar strokes in the basal ganglia bilaterally. Electronically Signed   By: Kayren Eaves.D.  On: 05/15/2018 14:31   Ct Hip Left Wo Contrast  Result Date: 05/15/2018 CLINICAL DATA:  Left hip pain due to a fall today. Initial encounter. EXAM: CT OF THE LEFT HIP WITHOUT CONTRAST TECHNIQUE: Multidetector CT imaging of the left hip was performed according to the standard protocol. Multiplanar CT image reconstructions were also generated. COMPARISON:  Plain films left hip earlier today. FINDINGS: Bones/Joint/Cartilage The patient has an acute left intertrochanteric fracture. The fracture is mildly impacted. The femoral head is located. No other fracture is identified. Bones are osteopenic. No avascular necrosis of the femoral head. Degenerative disease symphysis pubis noted. No notable degenerative change about the hip. Ligaments Intact. Muscles and Tendons Intact. Soft tissues Extensive atherosclerosis noted. IMPRESSION: Acute mildly impacted left intertrochanteric fracture. Osteopenia. Atherosclerosis. Electronically Signed   By: Drusilla Kanner M.D.   On: 05/15/2018 15:49   Dg Chest Portable 1 View  Result Date: 05/15/2018 CLINICAL DATA:  Recent fall with left hip fracture, initial encounter EXAM: PORTABLE CHEST 1 VIEW COMPARISON:  06/28/2016 FINDINGS: Cardiac shadow is stable. Aortic calcifications are again seen. The lungs are clear. No acute bony abnormality is seen. IMPRESSION: No active disease. Electronically Signed   By: Alcide Clever M.D.   On: 05/15/2018 16:21   Dg Knee Complete 4 Views Left  Result Date: 05/15/2018 CLINICAL DATA:  Recent fall with left knee pain, initial encounter EXAM: LEFT  KNEE - COMPLETE 4+ VIEW COMPARISON:  None. FINDINGS: Left knee replacement is noted. No definitive fracture or dislocation is noted. Some radiopaque densities are noted overlying the anterior aspect of the knee joint. These may be related to the recent injury. Clinical correlation is recommended. No other focal abnormality is noted. IMPRESSION: No definitive fracture is seen. Radiodensities over the anterior aspect of the joint space which may be related to foreign bodies. Clinical correlation with the recent injury is recommended. Electronically Signed   By: Alcide Clever M.D.   On: 05/15/2018 16:19   Dg Hip Operative Unilat W Or W/o Pelvis Left  Result Date: 05/16/2018 CLINICAL DATA:  No placement for fracture EXAM: OPERATIVE LEFT HIP    2 VIEWS TECHNIQUE: Fluoroscopic spot image(s) were submitted for interpretation post-operatively. COMPARISON:  CT left hip May 15, 2018 FLUOROSCOPY TIME:  0 minutes 36 seconds; 5 acquired images FINDINGS: Frontal and lateral views obtained. There is screw and nail fixation through an intertrochanteric femur fracture. Alignment anatomic at the fracture site. The tip of the screw is in the proximal femoral head. No new fracture. No dislocation. There is mild narrowing of the left hip joint. IMPRESSION: Screw and nail fixation for intertrochanteric fracture. Alignment anatomic. Tip of screw in proximal femoral head. Mild narrowing left hip joint. No new fracture. No dislocation. Electronically Signed   By: Bretta Bang III M.D.   On: 05/16/2018 13:47   Dg Hip Unilat W Or Wo Pelvis 2-3 Views Left  Result Date: 05/15/2018 CLINICAL DATA:  84 year old who fell earlier today landing on the LEFT hip. LEFT hip pain. Initial encounter. EXAM: DG HIP (WITH OR WITHOUT PELVIS) 2-3V LEFT COMPARISON:  Bone window images from CT abdomen and pelvis 07/15/2015. FINDINGS: No evidence of acute fracture or dislocation. Marked axial joint space narrowing. Osseous demineralization. Included AP  pelvis demonstrates moderate axial joint space narrowing in the contralateral RIGHT hip. Sacroiliac joints and symphysis pubis intact with mild degenerative changes. Degenerative changes involving the visualized LOWER lumbar spine. Surgical mesh material overlying the UPPER pelvis. Aorto-iliofemoral atherosclerosis without evidence of aneurysm. IMPRESSION:  1. No acute osseous abnormalities. 2. Severe osteoarthritis involving the LEFT hip. 3. Osseous demineralization. Electronically Signed   By: Hulan Saas M.D.   On: 05/15/2018 15:09     CBC Recent Labs  Lab 05/15/18 1515 05/16/18 0312 05/17/18 0445  WBC 8.2 7.4 8.7  HGB 13.2 11.3* 10.9*  HCT 38.2 31.8* 31.4*  PLT 102* 99* 98*  MCV 93.9 92.6 93.6  MCH 32.5 32.8 32.5  MCHC 34.6 35.4 34.7  RDW 15.3* 15.1* 15.1*  LYMPHSABS 1.1  --   --   MONOABS 0.5  --   --   EOSABS 1.1*  --   --   BASOSABS 0.1  --   --     Chemistries  Recent Labs  Lab 05/15/18 1515 05/16/18 0312 05/17/18 0445  NA 137 137 135  K 3.6 3.6 3.9  CL 106 105 106  CO2 21* 23 20*  GLUCOSE 98 89 131*  BUN 21* 23* 20  CREATININE 0.87 0.89 0.97  CALCIUM 9.1 9.0 8.8*   ------------------------------------------------------------------------------------------------------------------ estimated creatinine clearance is 38.8 mL/min (by C-G formula based on SCr of 0.97 mg/dL). ------------------------------------------------------------------------------------------------------------------ No results for input(s): HGBA1C in the last 72 hours. ------------------------------------------------------------------------------------------------------------------ No results for input(s): CHOL, HDL, LDLCALC, TRIG, CHOLHDL, LDLDIRECT in the last 72 hours. ------------------------------------------------------------------------------------------------------------------ No results for input(s): TSH, T4TOTAL, T3FREE, THYROIDAB in the last 72 hours.  Invalid input(s):  FREET3 ------------------------------------------------------------------------------------------------------------------ No results for input(s): VITAMINB12, FOLATE, FERRITIN, TIBC, IRON, RETICCTPCT in the last 72 hours.  Coagulation profile No results for input(s): INR, PROTIME in the last 168 hours.  No results for input(s): DDIMER in the last 72 hours.  Cardiac Enzymes No results for input(s): CKMB, TROPONINI, MYOGLOBIN in the last 168 hours.  Invalid input(s): CK ------------------------------------------------------------------------------------------------------------------ Invalid input(s): POCBNP    Assessment & Plan   84 year old female with past medical history of artery disease, paroxysmal atrial fibrillation, hypertension, osteoarthritis who presents to the hospital after a fall and noted to have a left hip fracture.  1.  History of paroxysmal atrial fibrillation-rate controlled.  Continue labetalol. -Eliquis restarted   3.  Status post fall and left hip fracture- status post repair PT evaluation and will need rehab  4.  Hyperlipidemia-continue atorvastatin.  5.  Essential hypertension- blood pressure stable continue Norvasc, labetalol.  Hold triamterene/HCTZ for now.  6.  Osteoporosis-continue vitamin D supplements.       Code Status Orders  (From admission, onward)        Start     Ordered   05/15/18 1749  Full code  Continuous     05/15/18 1749    Code Status History    Date Active Date Inactive Code Status Order ID Comments User Context   03/24/2017 1454 03/25/2017 1638 Full Code [id number]  Iran Ouch, MD Inpatient   02/24/2017 1234 02/24/2017 1936 Full Code [id number]  Iran Ouch, MD Inpatient   06/28/2016 2128 06/30/2016 2026 DNR [id number]  Houston Siren, MD Inpatient    Advance Directive Documentation     Most Recent Value  Type of Advance Directive  Healthcare Power of Attorney, Living will  Pre-existing out of facility DNR  order (yellow form or pink MOST form)  -  "MOST" Form in Place?  -           Consults orthopedic surgery  DVT Prophylaxis awaiting surgery Lab Results  Component Value Date   PLT 98 (L) 05/17/2018     Time Spent in minutes   35 minutes greater than  50% of time spent in care coordination and counseling patient regarding the condition and plan of care.   Auburn Bilberry M.D on 05/17/2018 at 1:26 PM  Between 7am to 6pm - Pager - [phone]  After 6pm go to [email or web address] - Social research officer, government  [employer]   Office  [phone]

## 2018-05-17 NOTE — Evaluation (Signed)
Occupational Therapy Evaluation Patient Details Name: [name] MRN: [id number] DOB: February 22, 1931 Today's Date: 05/17/2018    History of Present Illness [name] [place] is an 84 y/o F with PMH: arthritis, HTN, dizziness, and PAD. Pt presented s/p fall at church for which she sustained a L hip fx. Pt is now post-op day 1 after IM and IT nail placement with LLE WBAT.    Clinical Impression   Pt seen for OT evaluation this date, POD#1 from above surgery. Pt lives with son and daughter in a one story home with 3 STE. Pt's children assist with IADLs. Pt was independent in all ADLs with only intermittent assist from dtr to fasten bra prior to surgery, however occasionally using SPC for functional mobility for balance. Pt is eager to return to PLOF with improved safety and independence. Pt currently requires MOD A for LB dressing while in seated as well as MAX A for sit<>stand and all fxl transfers due to pain and limited AROM of L hip. Pt and present family member (son) instructed in self care skills, falls prevention strategies, home/routines modifications, DME/AE for LB bathing and dressing tasks, compression stocking mgt strategies, and transfer techniques. Pt would benefit from additional instruction in self care skills with or without assistive devices to support recall and carryover prior to discharge. Pt is moderate complexity evaluation d/t areas of ADLs of impacted by pain, balance deficits, and decreased LLE ROM s/p surgery. Recommend SNF upon discharge.      Follow Up Recommendations  SNF    Equipment Recommendations  Tub/shower bench;Toilet rise with handles    Recommendations for Other Services       Precautions / Restrictions Restrictions Weight Bearing Restrictions: Yes LLE Weight Bearing: Weight bearing as tolerated      Mobility Bed Mobility                  Transfers Overall transfer level: Needs assistance Equipment used: Rolling walker (2 wheeled) Transfers: Sit  to/from Stand Sit to Stand: Max assist              Balance Overall balance assessment: Needs assistance   Sitting balance-Leahy Scale: Normal     Standing balance support: Bilateral upper extremity supported Standing balance-Leahy Scale: Poor                             ADL either performed or assessed with clinical judgement   ADL Overall ADL's : Needs assistance/impaired Eating/Feeding: Independent   Grooming: Supervision/safety               Lower Body Dressing: Moderate assistance;With adaptive equipment;Cueing for sequencing               Functional mobility during ADLs: Maximal assistance;Cueing for safety;Cueing for sequencing       Vision Baseline Vision/History: Wears glasses Patient Visual Report: No change from baseline       Perception     Praxis      Pertinent Vitals/Pain Pain Assessment: 0-10 Pain Score: 7  Pain Location: L hip Pain Descriptors / Indicators: Aching Pain Intervention(s): Limited activity within patient's tolerance;Monitored during session;Premedicated before session     Hand Dominance Right   Extremity/Trunk Assessment Upper Extremity Assessment Upper Extremity Assessment: Overall WFL for tasks assessed   Lower Extremity Assessment Lower Extremity Assessment: Defer to PT evaluation   Cervical / Trunk Assessment Cervical / Trunk Assessment: Normal   Communication Communication Communication: No difficulties  Cognition Arousal/Alertness: Awake/alert Behavior During Therapy: WFL for tasks assessed/performed Overall Cognitive Status: Within Functional Limits for tasks assessed                                 General Comments: Pt able to list date and location, was confused about day of the week, required increased time to process, required intermittent repeition of sequence of steps for AE.    General Comments       Exercises Other Exercises Other Exercises: Education provided  about use of LE AE for dressing, sequencing and safe hand/foot placement for sit<>stand trasnfers, Pt verbalized understanding, but requires 50% assitance on return demonstration.    Shoulder Instructions      Home Living Family/patient expects to be discharged to:: Private residence Living Arrangements: Children Available Help at Discharge: Family Type of Home: House Home Access: Stairs to enter Secretary/administrator of Steps: 3   Home Layout: One level     Bathroom Shower/Tub: Chief Strategy Officer: Standard     Home Equipment: Environmental consultant - 2 wheels;Cane - single point          Prior Functioning/Environment Level of Independence: Independent with assistive device(s)        Comments: daughter would help intermittently with fastening bra, pt no longer drives, used cane intermittently, states she had fallen in home once or twice over last year, but with no injury, also stated she would furniture walk w/in the household        OT Problem List: Decreased strength;Decreased range of motion;Decreased activity tolerance;Impaired balance (sitting and/or standing);Pain      OT Treatment/Interventions: Self-care/ADL training;Therapeutic exercise;Balance training    OT Goals(Current goals can be found in the care plan section) Acute Rehab OT Goals Patient Stated Goal: to go home OT Goal Formulation: With patient/family Time For Goal Achievement: 05/31/18 Potential to Achieve Goals: Good  OT Frequency: Min 1X/week   Barriers to D/C:            Co-evaluation              AM-PAC PT "6 Clicks" Daily Activity     Outcome Measure Help from another person eating meals?: None Help from another person taking care of personal grooming?: A Little Help from another person toileting, which includes using toliet, bedpan, or urinal?: A Lot Help from another person bathing (including washing, rinsing, drying)?: A Lot Help from another person to put on and taking off  regular upper body clothing?: A Little Help from another person to put on and taking off regular lower body clothing?: A Lot 6 Click Score: 16   End of Session Equipment Utilized During Treatment: Gait belt;Rolling walker Nurse Communication: Mobility status  Activity Tolerance: Patient tolerated treatment well;Patient limited by pain Patient left: in chair;with call bell/phone within reach;with chair alarm set;with nursing/sitter in room  OT Visit Diagnosis: Unsteadiness on feet (R26.81);Muscle weakness (generalized) (M62.81);History of falling (Z91.81)                Time: 1610-9604 OT Time Calculation (min): 30 min Charges:  OT General Charges $OT Visit: 1 Visit OT Evaluation $OT Eval Moderate Complexity: 1 Mod OT Treatments $Self Care/Home Management : 8-22 mins Rejeana Brock, MS, OTR/L ascom [phone] or [phone] 05/17/18, 11:14 AM

## 2018-05-17 NOTE — Progress Notes (Signed)
   Subjective: 1 Day Post-Op Procedure(s) (LRB): INTRAMEDULLARY (IM) NAIL INTERTROCHANTRIC (Left) Patient reports pain as mild.   Patient is well, and has had no acute complaints or problems Denies any CP, SOB, ABD pain. We will start therapy today.  Plan is to go Skilled nursing facility after hospital stay.  Objective: Vital signs in last 24 hours: Temp:  [96.3 F (35.7 C)-98.7 F (37.1 C)] 98.5 F (36.9 C) (06/04 0501) Pulse Rate:  [58-88] 69 (06/04 0501) Resp:  [10-18] 16 (06/04 0501) BP: (113-152)/(51-84) 147/73 (06/04 0501) SpO2:  [97 %-100 %] 100 % (06/04 0501)  Intake/Output from previous day: 06/03 0701 - 06/04 0700 In: 1167.5 [P.O.:240; I.V.:827.5; IV Piggyback:100] Out: 825 [Urine:775; Blood:50] Intake/Output this shift: No intake/output data recorded.  Recent Labs    05/15/18 1515 05/16/18 0312 05/17/18 0445  HGB 13.2 11.3* 10.9*   Recent Labs    05/16/18 0312 05/17/18 0445  WBC 7.4 8.7  RBC 3.43* 3.36*  HCT 31.8* 31.4*  PLT 99* 98*   Recent Labs    05/16/18 0312 05/17/18 0445  NA 137 135  K 3.6 3.9  CL 105 106  CO2 23 20*  BUN 23* 20  CREATININE 0.89 0.97  GLUCOSE 89 131*  CALCIUM 9.0 8.8*   No results for input(s): LABPT, INR in the last 72 hours.  EXAM General - Patient is Alert, Appropriate and Oriented Extremity - Neurovascular intact Sensation intact distally Intact pulses distally Dorsiflexion/Plantar flexion intact No cellulitis present Compartment soft Dressing - dressing C/D/I and scant drainage Motor Function - intact, moving foot and toes well on exam.  Past Medical History:  Diagnosis Date  . Arthritis    "maybe a little" (03/24/2017)  . Dizziness   . Hypertension   . PAD (peripheral artery disease) (HCC)     Assessment/Plan:   1 Day Post-Op Procedure(s) (LRB): INTRAMEDULLARY (IM) NAIL INTERTROCHANTRIC (Left) Active Problems:   Closed left hip fracture (HCC)  Estimated body mass index is 24.21 kg/m as  calculated from the following:   Height as of 08/24/17: 5\' 6"  (1.676 m).   Weight as of this encounter: 150 lb (68 kg). Advance diet Up with therapy  Patient doing well, Pain controlled VSS Labs stable Recheck labs in the am  Follow up with [facility] in 6 weeks for x-rays of the left femur Please remove staples and apply Steri-Strips on 05/30/2018  DVT Prophylaxis - SCD, Eliquis Weight-Bearing as tolerated to left leg   T. Cranston Neighborhris Gaines, PA-C [facility] 05/17/2018, 7:15 AM

## 2018-05-17 NOTE — Progress Notes (Signed)
Clinical Social Worker (CSW) met with patient and her son [name] was at bedside. CSW presented bed offers. They chose [facility]. Per [facility] admissions coordinator at [facility] he will start [facility] SNF authorization today.   [employer], LCSW [phone]

## 2018-05-17 NOTE — Progress Notes (Signed)
Physical Therapy Treatment Patient Details Name: [name] MRN: [id number] DOB: 06/26/1931 Today's Date: 05/17/2018    History of Present Illness 84 y/o F with PMH: arthritis, HTN, dizziness, and PAD. Pt presented s/p fall at church for which she sustained a L hip fx. s/p L IM nailing 05/16/18.    PT Comments    Pt very limited with mobility and tolerance with even light supine exercises.  She showed great effort and willingness to participate but pain, weakness, mental status and general inability to fully appreciate her limitations were an issue.  She needed heavy assist with all change in positions and though she had good intentions of trying many activities on her own she ultimately was max assist for most tasks.     Follow Up Recommendations  SNF     Equipment Recommendations       Recommendations for Other Services       Precautions / Restrictions Precautions Precautions: Fall Restrictions LLE Weight Bearing: Weight bearing as tolerated    Mobility  Bed Mobility Overal bed mobility: Needs Assistance Bed Mobility: Supine to Sit     Supine to sit: Max assist     General bed mobility comments: Pt needed heavy assist to lift LEs and get her adjusted back into bed  Transfers Overall transfer level: Needs assistance Equipment used: Rolling walker (2 wheeled) Transfers: Sit to/from Stand Sit to Stand: Max assist         General transfer comment: Pt seemed to feel that she could do more on her own but after multiple tries and aborted efforts she ultimately needed very heavy assist to achieve and maintain standing.   Ambulation/Gait Ambulation/Gait assistance: Max assist Ambulation Distance (Feet): 3 Feet Assistive device: Rolling walker (2 wheeled)       General Gait Details: Pt did better this afternoon, but still required max assist and constant assist to keep weight forward and encourage appropriate walker use.  Pt very unsafe and struggled to effective use  AD or follow instructions appropriately.    Stairs             Wheelchair Mobility    Modified Rankin (Stroke Patients Only)       Balance Overall balance assessment: Needs assistance   Sitting balance-Leahy Scale: Poor       Standing balance-Leahy Scale: Poor                              Cognition Arousal/Alertness: Awake/alert Behavior During Therapy: WFL for tasks assessed/performed Overall Cognitive Status: Within Functional Limits for tasks assessed                                 General Comments: Pt needing extra time and cuing to execute most activities, regular re-direction needed      Exercises General Exercises - Lower Extremity Ankle Circles/Pumps: AROM;10 reps Quad Sets: Strengthening;10 reps Gluteal Sets: Strengthening;10 reps Short Arc Quad: AROM;Strengthening;10 reps Heel Slides: AAROM;10 reps(c/o a lot of pain with minimal movement) Hip ABduction/ADduction: AAROM;10 reps    General Comments        Pertinent Vitals/Pain Pain Score: 6  Pain Location: L hip    Home Living                      Prior Function  PT Goals (current goals can now be found in the care plan section) Progress towards PT goals: Progressing toward goals    Frequency    BID      PT Plan Current plan remains appropriate    Co-evaluation              AM-PAC PT "6 Clicks" Daily Activity  Outcome Measure  Difficulty turning over in bed (including adjusting bedclothes, sheets and blankets)?: Unable Difficulty moving from lying on back to sitting on the side of the bed? : Unable Difficulty sitting down on and standing up from a chair with arms (e.g., wheelchair, bedside commode, etc,.)?: Unable Help needed moving to and from a bed to chair (including a wheelchair)?: Total Help needed walking in hospital room?: Total Help needed climbing 3-5 steps with a railing? : Total 6 Click Score: 6    End of  Session Equipment Utilized During Treatment: Gait belt Activity Tolerance: Patient limited by pain Patient left: with bed alarm set;with call bell/phone within reach;with family/visitor present Nurse Communication: Mobility status PT Visit Diagnosis: Muscle weakness (generalized) (M62.81);Difficulty in walking, not elsewhere classified (R26.2)     Time: 1610-9604 PT Time Calculation (min) (ACUTE ONLY): 44 min  Charges:  $Gait Training: 8-22 mins $Therapeutic Exercise: 8-22 mins $Therapeutic Activity: 8-22 mins                    G Codes:       Malachi Pro, DPT 05/17/2018, 5:44 PM

## 2018-05-17 NOTE — Anesthesia Postprocedure Evaluation (Signed)
Anesthesia Post Note  Patient: [name]  Procedure(s) Performed: INTRAMEDULLARY (IM) NAIL INTERTROCHANTRIC (Left )  Patient location during evaluation: PACU Anesthesia Type: General Level of consciousness: awake and alert Pain management: pain level controlled Vital Signs Assessment: post-procedure vital signs reviewed and stable Respiratory status: spontaneous breathing, nonlabored ventilation, respiratory function stable and patient connected to nasal cannula oxygen Cardiovascular status: blood pressure returned to baseline and stable Postop Assessment: no apparent nausea or vomiting Anesthetic complications: no     Last Vitals:  Vitals:   05/17/18 1016 05/17/18 1150  BP: (!) 165/78 (!) 144/69  Pulse: 70 70  Resp:    Temp:  36.8 C  SpO2:  97%    Last Pain:  Vitals:   05/17/18 1150  TempSrc: Oral  PainSc:                  [name]

## 2018-05-18 MED ORDER — DOCUSATE SODIUM 100 MG PO CAPS: 100.0000 mg | ORAL_CAPSULE | Freq: Two times a day (BID) | ORAL | 0 refills | Status: DC

## 2018-05-18 MED ORDER — HYDROCODONE-ACETAMINOPHEN 5-325 MG PO TABS: 1.0000 | ORAL_TABLET | ORAL | 0 refills | Status: DC | PRN

## 2018-05-18 NOTE — Discharge Summary (Signed)
[facility] - [place] [facility]  [name], 84 y.o., DOB 02-21-1931, MRN [id number]. Admission date: 05/15/2018 Discharge Date 05/18/2018 Primary MD [name], Narges, MD Admitting Physician Houston SirenVivek J Sainani, MD  Admission Diagnosis  Closed fracture of left hip, initial encounter [facility](HCC) [S72.002A]  Discharge Diagnosis   Active Problems: Status post fall with left hip fracture History of paroxysmal atrial fibrillation Hyperlipidemia Essential hypertension Osteoporosis   Hospital Course  84 year old female with past medical history of artery disease, paroxysmal atrial fibrillation, hypertension, osteoarthritis who presents to the hospital after a fall and noted to have a left hip fracture.  Patient was admitted and seen by orthopedic surgery and underwent surgery.  Patient tolerated the procedure without any complications.  She is stable to be discharged to skilled nursing facility for rehab              Consults  orthopedic surgery  Significant Tests:  See full reports for all details     Ct Head Wo Contrast  Result Date: 05/15/2018 CLINICAL DATA:  84 year old who fell while at church earlier today. Initial encounter. EXAM: CT HEAD WITHOUT CONTRAST TECHNIQUE: Contiguous axial images were obtained from the base of the skull through the vertex without intravenous contrast. COMPARISON:  08/02/2015. FINDINGS: Brain: Moderate age related cortical, deep and cerebellar atrophy, unchanged. Mild-to-moderate changes of small vessel disease of the white matter diffusely with remote lacunar strokes in the basal ganglia bilaterally, unchanged. No mass lesion. No midline shift. No acute hemorrhage or hematoma. No extra-axial fluid collections. No evidence of acute infarction. Vascular: Severe BILATERAL carotid siphon and moderate BILATERAL vertebral artery atherosclerosis. Skull: Hyperostosis frontalis interna. No skull fracture or other focal osseous abnormality involving the  skull. Sinuses/Orbits: Visualized paranasal sinuses, bilateral mastoid air cells and bilateral middle ear cavities well-aerated. Visualized orbits and globes normal in appearance. Other: None. IMPRESSION: 1. No acute intracranial abnormality. 2. Stable moderate age related generalized atrophy and mild to moderate chronic microvascular ischemic changes of the white matter. Stable remote lacunar strokes in the basal ganglia bilaterally. Electronically Signed   By: Hulan Saashomas  Lawrence M.D.   On: 05/15/2018 14:31   Ct Hip Left Wo Contrast  Result Date: 05/15/2018 CLINICAL DATA:  Left hip pain due to a fall today. Initial encounter. EXAM: CT OF THE LEFT HIP WITHOUT CONTRAST TECHNIQUE: Multidetector CT imaging of the left hip was performed according to the standard protocol. Multiplanar CT image reconstructions were also generated. COMPARISON:  Plain films left hip earlier today. FINDINGS: Bones/Joint/Cartilage The patient has an acute left intertrochanteric fracture. The fracture is mildly impacted. The femoral head is located. No other fracture is identified. Bones are osteopenic. No avascular necrosis of the femoral head. Degenerative disease symphysis pubis noted. No notable degenerative change about the hip. Ligaments Intact. Muscles and Tendons Intact. Soft tissues Extensive atherosclerosis noted. IMPRESSION: Acute mildly impacted left intertrochanteric fracture. Osteopenia. Atherosclerosis. Electronically Signed   By: Drusilla Kannerhomas  Dalessio M.D.   On: 05/15/2018 15:49   Dg Chest Portable 1 View  Result Date: 05/15/2018 CLINICAL DATA:  Recent fall with left hip fracture, initial encounter EXAM: PORTABLE CHEST 1 VIEW COMPARISON:  06/28/2016 FINDINGS: Cardiac shadow is stable. Aortic calcifications are again seen. The lungs are clear. No acute bony abnormality is seen. IMPRESSION: No active disease. Electronically Signed   By: Alcide CleverMark  Lukens M.D.   On: 05/15/2018 16:21   Dg Knee Complete 4 Views Left  Result Date:  05/15/2018 CLINICAL DATA:  Recent fall with left knee pain,  initial encounter EXAM: LEFT KNEE - COMPLETE 4+ VIEW COMPARISON:  None. FINDINGS: Left knee replacement is noted. No definitive fracture or dislocation is noted. Some radiopaque densities are noted overlying the anterior aspect of the knee joint. These may be related to the recent injury. Clinical correlation is recommended. No other focal abnormality is noted. IMPRESSION: No definitive fracture is seen. Radiodensities over the anterior aspect of the joint space which may be related to foreign bodies. Clinical correlation with the recent injury is recommended. Electronically Signed   By: Alcide Clever M.D.   On: 05/15/2018 16:19   Dg Hip Operative Unilat W Or W/o Pelvis Left  Result Date: 05/16/2018 CLINICAL DATA:  No placement for fracture EXAM: OPERATIVE LEFT HIP    2 VIEWS TECHNIQUE: Fluoroscopic spot image(s) were submitted for interpretation post-operatively. COMPARISON:  CT left hip May 15, 2018 FLUOROSCOPY TIME:  0 minutes 36 seconds; 5 acquired images FINDINGS: Frontal and lateral views obtained. There is screw and nail fixation through an intertrochanteric femur fracture. Alignment anatomic at the fracture site. The tip of the screw is in the proximal femoral head. No new fracture. No dislocation. There is mild narrowing of the left hip joint. IMPRESSION: Screw and nail fixation for intertrochanteric fracture. Alignment anatomic. Tip of screw in proximal femoral head. Mild narrowing left hip joint. No new fracture. No dislocation. Electronically Signed   By: Bretta Bang III M.D.   On: 05/16/2018 13:47   Dg Hip Unilat W Or Wo Pelvis 2-3 Views Left  Result Date: 05/15/2018 CLINICAL DATA:  84 year old who fell earlier today landing on the LEFT hip. LEFT hip pain. Initial encounter. EXAM: DG HIP (WITH OR WITHOUT PELVIS) 2-3V LEFT COMPARISON:  Bone window images from CT abdomen and pelvis 07/15/2015. FINDINGS: No evidence of acute fracture or  dislocation. Marked axial joint space narrowing. Osseous demineralization. Included AP pelvis demonstrates moderate axial joint space narrowing in the contralateral RIGHT hip. Sacroiliac joints and symphysis pubis intact with mild degenerative changes. Degenerative changes involving the visualized LOWER lumbar spine. Surgical mesh material overlying the UPPER pelvis. Aorto-iliofemoral atherosclerosis without evidence of aneurysm. IMPRESSION: 1. No acute osseous abnormalities. 2. Severe osteoarthritis involving the LEFT hip. 3. Osseous demineralization. Electronically Signed   By: Hulan Saas M.D.   On: 05/15/2018 15:09       Today   Subjective:   [name] patient doing well denies any complaint Objective:   Blood pressure 121/62, pulse 64, temperature 98.5 F (36.9 C), temperature source Oral, resp. rate 16, height 5\' 5"  (1.651 m), weight 68 kg (150 lb), SpO2 100 %.  .  Intake/Output Summary (Last 24 hours) at 05/18/2018 1111 Last data filed at 05/18/2018 1016 Gross per 24 hour  Intake 2547.5 ml  Output 300 ml  Net 2247.5 ml    Exam VITAL SIGNS: Blood pressure 121/62, pulse 64, temperature 98.5 F (36.9 C), temperature source Oral, resp. rate 16, height 5\' 5"  (1.651 m), weight 68 kg (150 lb), SpO2 100 %.  GENERAL:  84 y.o.-year-old patient lying in the bed with no acute distress.  EYES: Pupils equal, round, reactive to light and accommodation. No scleral icterus. Extraocular muscles intact.  HEENT: Head atraumatic, normocephalic. Oropharynx and nasopharynx clear.  NECK:  Supple, no jugular venous distention. No thyroid enlargement, no tenderness.  LUNGS: Normal breath sounds bilaterally, no wheezing, rales,rhonchi or crepitation. No use of accessory muscles of respiration.  CARDIOVASCULAR: S1, S2 normal. No murmurs, rubs, or gallops.  ABDOMEN: Soft, nontender, nondistended. Bowel sounds  present. No organomegaly or mass.  EXTREMITIES: No pedal edema, cyanosis, or clubbing.   NEUROLOGIC: Cranial nerves II through XII are intact. Muscle strength 5/5 in all extremities. Sensation intact. Gait not checked.  PSYCHIATRIC: The patient is alert and oriented x 3.  SKIN: No obvious rash, lesion, or ulcer.   Data Review     CBC w Diff:  Lab Results  Component Value Date   WBC 8.7 05/17/2018   HGB 10.9 (L) 05/17/2018   HGB 12.5 10/17/2014   HCT 31.4 (L) 05/17/2018   HCT 36.3 10/17/2014   PLT 98 (L) 05/17/2018   PLT 113 (L) 10/17/2014   LYMPHOPCT 13 05/15/2018   LYMPHOPCT 23.5 10/17/2014   MONOPCT 6 05/15/2018   MONOPCT 8.4 10/17/2014   EOSPCT 13 05/15/2018   EOSPCT 4.0 10/17/2014   BASOPCT 1 05/15/2018   BASOPCT 0.7 10/17/2014   CMP:  Lab Results  Component Value Date   NA 135 05/17/2018   NA 139 10/17/2014   K 3.9 05/17/2018   K 3.4 (L) 10/17/2014   CL 106 05/17/2018   CL 104 10/17/2014   CO2 20 (L) 05/17/2018   CO2 28 10/17/2014   BUN 20 05/17/2018   BUN 16 10/17/2014   CREATININE 0.97 05/17/2018   CREATININE 0.77 10/17/2014   PROT 7.4 06/28/2016   PROT 6.7 10/16/2014   ALBUMIN 4.3 06/28/2016   ALBUMIN 3.5 10/16/2014   BILITOT 1.4 (H) 06/28/2016   BILITOT 1.5 (H) 10/16/2014   ALKPHOS 80 06/28/2016   ALKPHOS 78 10/16/2014   AST 31 06/28/2016   AST 27 10/16/2014   ALT 17 06/28/2016   ALT 21 10/16/2014  .  Micro Results Recent Results (from the past 240 hour(s))  Surgical PCR screen     Status: None   Collection Time: 05/15/18  6:58 PM  Result Value Ref Range Status   MRSA, PCR NEGATIVE NEGATIVE Final   Staphylococcus aureus NEGATIVE NEGATIVE Final    Comment: (NOTE) The Xpert SA Assay (FDA approved for NASAL specimens in patients 3 years of age and older), is one component of a comprehensive surveillance program. It is not intended to diagnose infection nor to guide or monitor treatment. Performed at [facility], [address] [place], [place] [address]         Code Status Orders  (From admission, onward)         Start     Ordered   05/15/18 1749  Full code  Continuous     05/15/18 1749    Code Status History    Date Active Date Inactive Code Status Order ID Comments User Context   03/24/2017 1454 03/25/2017 1638 Full Code [id number]  Iran Ouch, MD Inpatient   02/24/2017 1234 02/24/2017 1936 Full Code [id number]  Iran Ouch, MD Inpatient   06/28/2016 2128 06/30/2016 2026 DNR [id number]  Houston Siren, MD Inpatient    Advance Directive Documentation     Most Recent Value  Type of Advance Directive  Healthcare Power of Attorney, Living will  Pre-existing out of facility DNR order (yellow form or pink MOST form)  -  "MOST" Form in Place?  -           Contact information for follow-up providers    Evon Slack, PA-C Follow up in 6 week(s).   Specialties:  Orthopedic Surgery, Emergency Medicine Why:  Remove left hip staples and apply Steri-Strips on 05/30/2018 Contact information: [address] [place] [address] [phone]  Contact information for after-discharge care    Destination    [facility] .   Service:  Skilled Nursing Contact information: [address] [place] [address] [phone]                  Discharge Medications   Allergies as of 05/18/2018      Reactions   Sulfa Antibiotics Shortness Of Breath, Other (See Comments)   dizziness      Medication List    STOP taking these medications   acetaminophen 500 MG tablet Commonly known as:  TYLENOL   traMADol 50 MG tablet Commonly known as:  ULTRAM   triamterene-hydrochlorothiazide 37.5-25 MG tablet Commonly known as:  MAXZIDE-25     TAKE these medications   amLODipine 5 MG tablet Commonly known as:  NORVASC Take 5 mg by mouth daily. What changed:  Another medication with the same name was removed. Continue taking this medication, and follow the directions you see here.   atorvastatin 20 MG tablet Commonly known  as:  LIPITOR Take 1 tablet (20 mg total) by mouth daily at 6 PM.   cholecalciferol 1000 units tablet Commonly known as:  VITAMIN D Take 1,000 Units by mouth daily.   clopidogrel 75 MG tablet Commonly known as:  PLAVIX Take 1 tablet (75 mg total) by mouth daily.   docusate sodium 100 MG capsule Commonly known as:  COLACE Take 1 capsule (100 mg total) by mouth 2 (two) times daily.   ELIQUIS 5 MG Tabs tablet Generic drug:  apixaban Take 5 mg by mouth 2 (two) times daily.   furosemide 20 MG tablet Commonly known as:  LASIX Take 10 mg by mouth daily with breakfast.   HYDROcodone-acetaminophen 5-325 MG tablet Commonly known as:  NORCO/VICODIN Take 1-2 tablets by mouth every 4 (four) hours as needed for moderate pain.   labetalol 100 MG tablet Commonly known as:  NORMODYNE Take 100 mg by mouth 2 (two) times daily.          Total Time in preparing paper work, data evaluation and todays exam - 35 minutes  Auburn Bilberry M.D on 05/18/2018 at 11:11 AM [facility]   Office  [phone]

## 2018-05-18 NOTE — Care Management Important Message (Signed)
Important Message  Patient Details  Name: [name] MRN: [id number] Date of Birth: 1931/07/10   Medicare Important Message Given:  Yes    Olegario MessierKathy A Denaja Verhoeven 05/18/2018, 10:32 AM

## 2018-05-18 NOTE — Discharge Instructions (Signed)
Diet: As you were doing prior to hospitalization    Dressing:  You may change your dressing as needed. Change the dressing with sterile gauze dressing.  On 05/30/2018, remove staples and apply Steri-Strips.  After staples have been removed patient can begin showering but do not submerge incision sites underwater.  Activity:  Increase activity slowly as tolerated, but follow the weight bearing instructions below.  No lifting or driving for 6 weeks.  Weight Bearing:   Weight bearing as tolerated to left lower extremity  To prevent constipation: you may use a stool softener such as -  Colace (over the counter) 100 mg by mouth twice a day  Drink plenty of fluids (prune juice may be helpful) and high fiber foods Miralax (over the counter) for constipation as needed.    Itching:  If you experience itching with your medications, try taking only a single pain pill, or even half a pain pill at a time.  You may take up to 10 pain pills per day, and you can also use benadryl over the counter for itching or also to help with sleep.   Precautions:  If you experience chest pain or shortness of breath - call 911 immediately for transfer to the hospital emergency department!!  If you develop a fever greater that 101 F, purulent drainage from wound, increased redness or drainage from wound, or calf pain-Call [facility]                                              Follow- Up Appointment:  [facility]

## 2018-05-18 NOTE — Progress Notes (Signed)
Physical Therapy Treatment Patient Details Name: [name] MRN: [id number] DOB: 05/04/1931 Today's Date: 05/18/2018    History of Present Illness 84 y/o F with PMH: arthritis, HTN, dizziness, and PAD. Pt [place]/p fall at [employer] for which she sustained a L hip fx. s/p L IM nailing 05/16/18.    PT Comments    Ms. Deist made very modest progress with mobility this session.  She continues to require max cues for safety with transfers and frequently does not follow cues provided.  Pt requires mod assist for bed mobility, mod assist to stand, and max assist for stand pivot transfer.  SNF remains most appropriate d/c recommendation at this time.    Follow Up Recommendations  SNF     Equipment Recommendations  Other (comment)(TBD at next venue of care)    Recommendations for Other Services       Precautions / Restrictions Precautions Precautions: Fall Restrictions Weight Bearing Restrictions: Yes LLE Weight Bearing: Weight bearing as tolerated    Mobility  Bed Mobility Overal bed mobility: Needs Assistance Bed Mobility: Supine to Sit     Supine to sit: Mod assist;HOB elevated     General bed mobility comments: Cues for sequencing and assist advancing LEs to EOB, elevating trunk, and pt using bed rail.   Transfers Overall transfer level: Needs assistance Equipment used: Rolling walker (2 wheeled) Transfers: Sit to/from Stand Sit to Stand: From elevated surface;Mod assist         General transfer comment: Bed elevated for ease with standing.  Pt requires 2 attempts as on the first attempt she becomes nervous about falling.  Cues for proper hand placement and safe technique.  Assist to boost to standing and increased time to have pt move hands onto RW from bed rail and bed as pt with fear of falling. To pivot the pt requires assist to weight shift to allow for progression of other LE and manual assist to step with LLE.  Despite max verbal cues the pt continues to take  hands off RW and reach for armrest of chair prematurely.  Pt demonstrates flexed posture when attempting to step with RLE despite cues for upright posture.  Ultimately the pt became unsafe and attempting to sit prematurely and required assist to complete pivot and sit.   Ambulation/Gait             General Gait Details: Not safe to attempt ambulation at this time   Stairs             Wheelchair Mobility    Modified Rankin (Stroke Patients Only)       Balance Overall balance assessment: Needs assistance Sitting-balance support: Bilateral upper extremity supported;Feet supported Sitting balance-Leahy Scale: Poor Sitting balance - Comments: Pt relies on at least 1UE support to maintain balance sitting EOB   Standing balance support: Bilateral upper extremity supported;During functional activity Standing balance-Leahy Scale: Poor Standing balance comment: Pt relies on BUE support and outside physical assist to maintain balance in static standing                            Cognition Arousal/Alertness: Awake/alert Behavior During Therapy: WFL for tasks assessed/performed Overall Cognitive Status: Within Functional Limits for tasks assessed                                 General Comments: Pt needing extra time  and cuing to execute most activities, regular re-direction needed      Exercises General Exercises - Lower Extremity Ankle Circles/Pumps: AROM;10 reps;Both;Supine Quad Sets: Strengthening;10 reps;Both;Supine Hip ABduction/ADduction: AROM;Left;10 reps;Supine    General Comments        Pertinent Vitals/Pain Pain Assessment: Faces Faces Pain Scale: Hurts little more Pain Location: L hip Pain Descriptors / Indicators: Aching Pain Intervention(s): Limited activity within patient's tolerance;Monitored during session;Repositioned;Premedicated before session    Home Living                      Prior Function             PT Goals (current goals can now be found in the care plan section) Acute Rehab PT Goals PT Goal Formulation: With patient Time For Goal Achievement: 05/31/18 Potential to Achieve Goals: Good Progress towards PT goals: Progressing toward goals(very modestly)    Frequency    BID      PT Plan Current plan remains appropriate    Co-evaluation              AM-PAC PT "6 Clicks" Daily Activity  Outcome Measure  Difficulty turning over in bed (including adjusting bedclothes, sheets and blankets)?: Unable Difficulty moving from lying on back to sitting on the side of the bed? : Unable Difficulty sitting down on and standing up from a chair with arms (e.g., wheelchair, bedside commode, etc,.)?: Unable Help needed moving to and from a bed to chair (including a wheelchair)?: Total Help needed walking in hospital room?: Total Help needed climbing 3-5 steps with a railing? : Total 6 Click Score: 6    End of Session Equipment Utilized During Treatment: Gait belt Activity Tolerance: Patient limited by pain;Patient limited by fatigue Patient left: with call bell/phone within reach;in chair;with SCD's reapplied;with chair alarm set Nurse Communication: Mobility status;Other (comment)(pt's impulsivity with mobility) PT Visit Diagnosis: Muscle weakness (generalized) (M62.81);Difficulty in walking, not elsewhere classified (R26.2)     Time: [id number] PT Time Calculation (min) (ACUTE ONLY): 26 min  Charges:  $Therapeutic Exercise: 8-22 mins $Therapeutic Activity: 8-22 mins                    G Codes:       Encarnacion ChuAshley Abashian PT, DPT 05/18/2018, 10:36 AM

## 2018-05-18 NOTE — Progress Notes (Signed)
Patient is medically stable for D/C to [facility] today. [facility] has been received and patient can come today to room E-15. RN will call report and arrange EMS for transport. Clinical Child psychotherapistocial Worker (CSW) [facility] via [employer]. Patient is aware of above. CSW contacted patient's son Les PouCarlton and made him aware of above. Please reconsult if future social work needs arise. CSW signing off.   Baker Hughes IncorporatedBailey Doral Ventrella, LCSW [phone]

## 2018-05-18 NOTE — Progress Notes (Addendum)
[facility] [place] [facility] removed. Will assist pt to get dressed and await transport.

## 2018-05-18 NOTE — Progress Notes (Signed)
EMS called for transport.

## 2018-05-18 NOTE — Clinical Social Work Placement (Signed)
   CLINICAL SOCIAL WORK PLACEMENT  NOTE  Date:  05/18/2018  Patient Details  Name: Jordan BandaCecil J Dunbar MRN: [id number] Date of Birth: [birth year]  Clinical Social Work is seeking post-discharge placement for this patient at the Skilled  Nursing Facility level of care (*CSW will initial, date and re-position this form in  chart as items are completed):  Yes   Patient/family provided with [facility] Social Work Department's list of facilities offering this level of care within the geographic area requested by the patient (or if unable, by the patient's family).  Yes   Patient/family informed of their freedom to choose among providers that offer the needed level of care, that participate in Medicare, Medicaid or managed care program needed by the patient, have an available bed and are willing to accept the patient.  Yes   Patient/family informed of [facility]'s ownership interest in [facility] and [facility], as well as of the fact that they are under no obligation to receive care at these facilities.  PASRR submitted to EDS on 05/16/18     PASRR number received on 05/16/18     Existing PASRR number confirmed on       FL2 transmitted to all facilities in geographic area requested by pt/family on 05/16/18     FL2 transmitted to all facilities within larger geographic area on       Patient informed that his/her managed care company has contracts with or will negotiate with certain facilities, including the following:        Yes   Patient/family informed of bed offers received.  Patient chooses bed at [facility] )     Physician recommends and patient chooses bed at      Patient to be transferred to [facility] ) on 05/18/18.  Patient to be transferred to facility by [facility] )     Patient family notified on 05/18/18 of transfer.  Name of family member notified:  (Patient's son Les PouCarlton is aware of D/C today. )     PHYSICIAN       Additional Comment:     _______________________________________________ Arliss Hepburn, [name], LCSW 05/18/2018, 12:46 PM

## 2018-05-18 NOTE — Progress Notes (Signed)
   Subjective: 2 Days Post-Op Procedure(s) (LRB): INTRAMEDULLARY (IM) NAIL INTERTROCHANTRIC (Left) Patient reports pain as 5 on 0-10 scale.  Pain is stable, patient slept well throughout the night. Patient is well, and has had no acute complaints or problems Denies any CP, SOB, ABD pain. We will continue with physical therapy today.  Plan is to go Skilled nursing facility after hospital stay.  Objective: Vital signs in last 24 hours: Temp:  [97.9 F (36.6 C)-98.2 F (36.8 C)] 98 F (36.7 C) (06/05 0736) Pulse Rate:  [65-71] 68 (06/05 0736) Resp:  [16-17] 17 (06/05 0736) BP: (119-165)/(59-78) 122/60 (06/05 0736) SpO2:  [97 %-100 %] 100 % (06/05 0736)  Intake/Output from previous day: 06/04 0701 - 06/05 0700 In: 2307.5 [P.O.:720; I.V.:1587.5] Out: 300 [Urine:300] Intake/Output this shift: No intake/output data recorded.  Recent Labs    05/15/18 1515 05/16/18 0312 05/17/18 0445  HGB 13.2 11.3* 10.9*   Recent Labs    05/16/18 0312 05/17/18 0445  WBC 7.4 8.7  RBC 3.43* 3.36*  HCT 31.8* 31.4*  PLT 99* 98*   Recent Labs    05/16/18 0312 05/17/18 0445  NA 137 135  K 3.6 3.9  CL 105 106  CO2 23 20*  BUN 23* 20  CREATININE 0.89 0.97  GLUCOSE 89 131*  CALCIUM 9.0 8.8*   No results for input(s): LABPT, INR in the last 72 hours.  EXAM General - Patient is Alert, Appropriate and Oriented Extremity - Neurovascular intact Sensation intact distally Intact pulses distally Dorsiflexion/Plantar flexion intact No cellulitis present Compartment soft Dressing - dressing C/D/I and scant drainage Motor Function - intact, moving foot and toes well on exam.  Past Medical History:  Diagnosis Date  . Arthritis    "maybe a little" (03/24/2017)  . Dizziness   . Hypertension   . PAD (peripheral artery disease) (HCC)     Assessment/Plan:   2 Days Post-Op Procedure(s) (LRB): INTRAMEDULLARY (IM) NAIL INTERTROCHANTRIC (Left) Active Problems:   Closed left hip fracture  (HCC)  Estimated body mass index is 24.96 kg/m as calculated from the following:   Height as of this encounter: 5\' 5"  (1.651 m).   Weight as of this encounter: 68 kg (150 lb). Advance diet Up with therapy  Patient doing well, Pain controlled VSS Labs stable Care management to assist with discharge to skilled nursing facility  [facility] in 6 weeks for x-rays of the left femur Please remove staples and apply Steri-Strips on 05/30/2018  DVT Prophylaxis - SCD, Eliquis Weight-Bearing as tolerated to left leg   T. Cranston Neighborhris Gaines, PA-C [facility] 05/18/2018, 7:37 AM

## 2018-05-23 ENCOUNTER — Ambulatory Visit: Payer: Medicare Other | Admitting: Physician Assistant

## 2018-06-13 ENCOUNTER — Emergency Department: Payer: Medicare Other

## 2018-06-13 ENCOUNTER — Other Ambulatory Visit: Payer: Self-pay

## 2018-06-13 ENCOUNTER — Inpatient Hospital Stay
Admission: EM | Admit: 2018-06-13 | Discharge: 2018-06-18 | DRG: 292 | Disposition: A | Discharge status: Skilled Nursing Facility | Payer: Medicare Other | Attending: Internal Medicine | Admitting: Internal Medicine

## 2018-06-13 DIAGNOSIS — R0602 Shortness of breath: Secondary | ICD-10-CM

## 2018-06-13 DIAGNOSIS — Z8679 Personal history of other diseases of the circulatory system: Secondary | ICD-10-CM

## 2018-06-13 DIAGNOSIS — I739 Peripheral vascular disease, unspecified: Secondary | ICD-10-CM | POA: Diagnosis present

## 2018-06-13 DIAGNOSIS — I509 Heart failure, unspecified: Secondary | ICD-10-CM

## 2018-06-13 DIAGNOSIS — Y92230 Patient room in hospital as the place of occurrence of the external cause: Secondary | ICD-10-CM | POA: Diagnosis not present

## 2018-06-13 DIAGNOSIS — I255 Ischemic cardiomyopathy: Secondary | ICD-10-CM | POA: Diagnosis present

## 2018-06-13 DIAGNOSIS — I340 Nonrheumatic mitral (valve) insufficiency: Secondary | ICD-10-CM | POA: Diagnosis present

## 2018-06-13 DIAGNOSIS — X58XXXD Exposure to other specified factors, subsequent encounter: Secondary | ICD-10-CM | POA: Diagnosis present

## 2018-06-13 DIAGNOSIS — I481 Persistent atrial fibrillation: Secondary | ICD-10-CM | POA: Diagnosis present

## 2018-06-13 DIAGNOSIS — J40 Bronchitis, not specified as acute or chronic: Secondary | ICD-10-CM | POA: Diagnosis present

## 2018-06-13 DIAGNOSIS — T502X5A Adverse effect of carbonic-anhydrase inhibitors, benzothiadiazides and other diuretics, initial encounter: Secondary | ICD-10-CM | POA: Diagnosis not present

## 2018-06-13 DIAGNOSIS — E785 Hyperlipidemia, unspecified: Secondary | ICD-10-CM | POA: Diagnosis present

## 2018-06-13 DIAGNOSIS — I110 Hypertensive heart disease with heart failure: Principal | ICD-10-CM | POA: Diagnosis present

## 2018-06-13 DIAGNOSIS — Z79899 Other long term (current) drug therapy: Secondary | ICD-10-CM

## 2018-06-13 DIAGNOSIS — Z8249 Family history of ischemic heart disease and other diseases of the circulatory system: Secondary | ICD-10-CM

## 2018-06-13 DIAGNOSIS — R269 Unspecified abnormalities of gait and mobility: Secondary | ICD-10-CM | POA: Diagnosis present

## 2018-06-13 DIAGNOSIS — R059 Cough, unspecified: Secondary | ICD-10-CM

## 2018-06-13 DIAGNOSIS — Z823 Family history of stroke: Secondary | ICD-10-CM

## 2018-06-13 DIAGNOSIS — R490 Dysphonia: Secondary | ICD-10-CM | POA: Diagnosis present

## 2018-06-13 DIAGNOSIS — R05 Cough: Secondary | ICD-10-CM

## 2018-06-13 DIAGNOSIS — Z96652 Presence of left artificial knee joint: Secondary | ICD-10-CM | POA: Diagnosis present

## 2018-06-13 DIAGNOSIS — Z7902 Long term (current) use of antithrombotics/antiplatelets: Secondary | ICD-10-CM

## 2018-06-13 DIAGNOSIS — I5042 Chronic combined systolic (congestive) and diastolic (congestive) heart failure: Secondary | ICD-10-CM | POA: Diagnosis present

## 2018-06-13 DIAGNOSIS — D649 Anemia, unspecified: Secondary | ICD-10-CM | POA: Diagnosis present

## 2018-06-13 DIAGNOSIS — Z8673 Personal history of transient ischemic attack (TIA), and cerebral infarction without residual deficits: Secondary | ICD-10-CM

## 2018-06-13 DIAGNOSIS — I5023 Acute on chronic systolic (congestive) heart failure: Secondary | ICD-10-CM | POA: Diagnosis present

## 2018-06-13 DIAGNOSIS — I440 Atrioventricular block, first degree: Secondary | ICD-10-CM | POA: Diagnosis present

## 2018-06-13 DIAGNOSIS — R739 Hyperglycemia, unspecified: Secondary | ICD-10-CM | POA: Diagnosis present

## 2018-06-13 DIAGNOSIS — I10 Essential (primary) hypertension: Secondary | ICD-10-CM | POA: Diagnosis present

## 2018-06-13 DIAGNOSIS — S72009D Fracture of unspecified part of neck of unspecified femur, subsequent encounter for closed fracture with routine healing: Secondary | ICD-10-CM

## 2018-06-13 DIAGNOSIS — Z7901 Long term (current) use of anticoagulants: Secondary | ICD-10-CM

## 2018-06-13 DIAGNOSIS — Z6828 Body mass index (BMI) 28.0-28.9, adult: Secondary | ICD-10-CM

## 2018-06-13 DIAGNOSIS — E876 Hypokalemia: Secondary | ICD-10-CM | POA: Diagnosis not present

## 2018-06-13 DIAGNOSIS — Z882 Allergy status to sulfonamides status: Secondary | ICD-10-CM

## 2018-06-13 DIAGNOSIS — Z79891 Long term (current) use of opiate analgesic: Secondary | ICD-10-CM

## 2018-06-13 DIAGNOSIS — E669 Obesity, unspecified: Secondary | ICD-10-CM | POA: Diagnosis present

## 2018-06-13 DIAGNOSIS — M1990 Unspecified osteoarthritis, unspecified site: Secondary | ICD-10-CM | POA: Diagnosis present

## 2018-06-13 DIAGNOSIS — J9811 Atelectasis: Secondary | ICD-10-CM | POA: Diagnosis present

## 2018-06-13 DIAGNOSIS — D696 Thrombocytopenia, unspecified: Secondary | ICD-10-CM | POA: Diagnosis present

## 2018-06-13 DIAGNOSIS — I5021 Acute systolic (congestive) heart failure: Secondary | ICD-10-CM

## 2018-06-13 HISTORY — DX: Chronic diastolic (congestive) heart failure: I50.32

## 2018-06-13 HISTORY — DX: Other persistent atrial fibrillation: I48.19

## 2018-06-13 HISTORY — DX: Unspecified fall, initial encounter: W19.XXXA

## 2018-06-13 LAB — CBC WITH DIFFERENTIAL/PLATELET
BASOS PCT: 1 %
Basophils Absolute: 0 10*3/uL (ref 0–0.1)
EOS ABS: 0.6 10*3/uL (ref 0–0.7)
Eosinophils Relative: 9 %
HCT: 31.5 % — ABNORMAL LOW (ref 35.0–47.0)
HEMOGLOBIN: 10.7 g/dL — AB (ref 12.0–16.0)
Lymphocytes Relative: 15 %
Lymphs Abs: 1 10*3/uL (ref 1.0–3.6)
MCH: 32.8 pg (ref 26.0–34.0)
MCHC: 33.9 g/dL (ref 32.0–36.0)
MCV: 97 fL (ref 80.0–100.0)
Monocytes Absolute: 0.6 10*3/uL (ref 0.2–0.9)
Monocytes Relative: 10 %
NEUTROS PCT: 65 %
Neutro Abs: 4.2 10*3/uL (ref 1.4–6.5)
PLATELETS: 146 10*3/uL — AB (ref 150–440)
RBC: 3.25 MIL/uL — AB (ref 3.80–5.20)
RDW: 17.3 % — ABNORMAL HIGH (ref 11.5–14.5)
WBC: 6.4 10*3/uL (ref 3.6–11.0)

## 2018-06-13 MED ORDER — SODIUM CHLORIDE 0.9 % IV BOLUS: 250.0000 mL | Freq: Once | INTRAVENOUS | Status: DC

## 2018-06-13 NOTE — ED Triage Notes (Signed)
Pt arrives from home c/o SOB. Physician bedside.

## 2018-06-13 NOTE — ED Provider Notes (Signed)
[facility] Emergency Department Provider Note ____________________________________________   First MD Initiated Contact with Patient 06/13/18 2218     (approximate)  I have reviewed the triage vital signs and the nursing notes.   HISTORY  Chief Complaint Shortness of Breath  HPI [name] is a 84 y.o. female with a history of recent left-sided hip surgery as well as peripheral arterial disease and hypertension on Plavix who is presenting with shortness of breath over the past 2 days.  She denies a cough.  Shortness of breath is worse with exertion.  No history of COPD or CHF.  Also noting swelling to the left lower extremity.  Denies any chest pain.  No fever noted.  Brought in by EMS who noted that the patient had a normal oxygen saturation.  No medication or supplemental oxygen given in route.  Past Medical History:  Diagnosis Date  . Arthritis    "maybe a little" (03/24/2017)  . Dizziness   . Hypertension   . PAD (peripheral artery disease) [facility](HCC)     Patient Active Problem List   Diagnosis Date Noted  . Closed left hip fracture (HCC) 05/15/2018  . PAD (peripheral artery disease) (HCC)   . Critical lower limb ischemia 03/24/2017  . Sepsis (HCC) 06/28/2016    Past Surgical History:  Procedure Laterality Date  . ABDOMINAL AORTIC ANEURYSM REPAIR     hx/notes 07/26/2012  . ABDOMINAL AORTOGRAM N/A 02/24/2017   Procedure: Abdominal Aortogram;  Surgeon: Iran OuchMuhammad A Arida, MD;  Location: [facility] INVASIVE CV LAB;  Service: Cardiovascular;  Laterality: N/A;  . HERNIA REPAIR    . INTRAMEDULLARY (IM) NAIL INTERTROCHANTERIC Left 05/16/2018   Procedure: INTRAMEDULLARY (IM) NAIL INTERTROCHANTRIC;  Surgeon: Kennedy BuckerMenz, Michael, MD;  Location: [facility] ORS;  Service: Orthopedics;  Laterality: Left;  . JOINT REPLACEMENT    . LAPAROSCOPIC INCISIONAL / UMBILICAL / VENTRAL HERNIA REPAIR  2010   VHR w/mesh hx/notes 07/26/2012  . LOWER EXTREMITY ANGIOGRAPHY Bilateral 02/24/2017   Procedure: Lower Extremity Angiography;  Surgeon: Iran OuchMuhammad A Arida, MD;  Location: [facility] INVASIVE CV LAB;  Service: Cardiovascular;  Laterality: Bilateral;  . PERIPHERAL VASCULAR ATHERECTOMY  03/24/2017  . PERIPHERAL VASCULAR ATHERECTOMY Right 03/24/2017   Procedure: Peripheral Vascular Atherectomy;  Surgeon: Iran OuchMuhammad A Arida, MD;  Location: [facility] INVASIVE CV LAB;  Service: Cardiovascular;  Laterality: Right;  . TOTAL KNEE ARTHROPLASTY Left     Prior to Admission medications   Medication Sig Start Date End Date Taking? Authorizing Provider  amLODipine (NORVASC) 5 MG tablet Take 5 mg by mouth daily. 04/29/18   [provider]  atorvastatin (LIPITOR) 20 MG tablet Take 1 tablet (20 mg total) by mouth daily at 6 PM. 03/25/17   Laverda Pageoberts, Lindsay B, NP  cholecalciferol (VITAMIN D) 1000 units tablet Take 1,000 Units by mouth daily.     [provider]  clopidogrel (PLAVIX) 75 MG tablet Take 1 tablet (75 mg total) by mouth daily. Patient not taking: Reported on 05/15/2018 03/11/17   Iran OuchArida, Muhammad A, MD  docusate sodium (COLACE) 100 MG capsule Take 1 capsule (100 mg total) by mouth 2 (two) times daily. 05/18/18   Auburn BilberryPatel, Shreyang, MD  ELIQUIS 5 MG TABS tablet Take 5 mg by mouth 2 (two) times daily. 05/04/18   [provider]  furosemide (LASIX) 20 MG tablet Take 10 mg by mouth daily with breakfast.  06/20/17   [provider]  HYDROcodone-acetaminophen (NORCO/VICODIN) 5-325 MG tablet Take 1-2 tablets by mouth every 4 (four) hours as  needed for moderate pain. 05/18/18   Evon Slack, PA-C  labetalol (NORMODYNE) 100 MG tablet Take 100 mg by mouth 2 (two) times daily.  06/15/15   [provider]    Allergies Sulfa antibiotics  Family History  Problem Relation Age of Onset  . Hypertension Mother   . Heart attack Father   . Hypertension Sister   . Cancer Brother   . Stroke Brother     Social History Social History   Tobacco Use  . Smoking status: Never Smoker  .  Smokeless tobacco: Never Used  Substance Use Topics  . Alcohol use: No    Alcohol/week: 0.0 oz  . Drug use: No    Review of Systems  Constitutional: No fever/chills Eyes: No visual changes. ENT: No sore throat. Cardiovascular: Denies chest pain. Respiratory: As above Gastrointestinal: No abdominal pain.  No nausea, no vomiting.  No diarrhea.  No constipation. Genitourinary: Negative for dysuria. Musculoskeletal: Negative for back pain. Skin: Negative for rash. Neurological: Negative for headaches, focal weakness or numbness.   ____________________________________________   PHYSICAL EXAM:  VITAL SIGNS: ED Triage Vitals  Enc Vitals Group     BP 06/13/18 2224 (!) 150/85     Pulse Rate 06/13/18 2224 86     Resp 06/13/18 2224 (!) 23     Temp 06/13/18 2224 98.4 F (36.9 C)     Temp Source 06/13/18 2224 Oral     SpO2 06/13/18 2224 97 %     Weight 06/13/18 2233 150 lb (68 kg)     Height 06/13/18 2233 5\' 5"  (1.651 m)     Head Circumference --      Peak Flow --      Pain Score 06/13/18 2226 6     Pain Loc --      Pain Edu? --      Excl. in GC? --     Constitutional: Alert and oriented. Well appearing and in no acute distress. Eyes: Conjunctivae are normal.  Head: Atraumatic. Nose: No congestion/rhinnorhea. Mouth/Throat: Mucous membranes are moist.  Neck: No stridor.   Cardiovascular: Normal rate, regular rhythm. Grossly normal heart sounds.  Respiratory: Mildly labored respirations with minimal crackles to the bilateral bases.  Speaks in full sentences.  No wheezing. Gastrointestinal: Soft and nontender. No distention.  Musculoskeletal: Minimal right lower extremity edema which the patient says that her baseline.  Left lower external edema which is moderate which the patient says is increased over the past several days.  Edema is to the left ankle to the midcalf.  No erythema, induration or exudate present. Neurologic:  Normal speech and language. No gross focal  neurologic deficits are appreciated. Skin:  Skin is warm, dry and intact. No rash noted. Psychiatric: Mood and affect are normal. Speech and behavior are normal.  ____________________________________________   LABS (all labs ordered are listed, but only abnormal results are displayed)  Labs Reviewed  CBC WITH DIFFERENTIAL/PLATELET  BASIC METABOLIC PANEL  BRAIN NATRIURETIC PEPTIDE  TROPONIN I   ____________________________________________  EKG  ED ECG REPORT I, Arelia Longest, the attending physician, personally viewed and interpreted this ECG.   Date: 06/13/2018  EKG Time: 2228  Rate: 79  Rhythm: atrial fibrillation versus sinus arrhythmia, rate 79  Axis: Normal  Intervals:none  ST&T Change: No ST segment elevation or depression.  T wave inversion in V2.  T wave inversion in lead II appears new.  Possible lead placement.  ____________________________________________  RADIOLOGY  Patient with likely new onset CHF  on the chest x-ray. ____________________________________________   PROCEDURES  Procedure(s) performed:   Procedures  Critical Care performed:   ____________________________________________   INITIAL IMPRESSION / ASSESSMENT AND PLAN / ED COURSE  Pertinent labs & imaging results that were available during my care of the patient were reviewed by me and considered in my medical decision making (see chart for details).  Differential includes, but is not limited to, viral syndrome, bronchitis including COPD exacerbation, pneumonia, reactive airway disease including asthma, CHF including exacerbation with or without pulmonary/interstitial edema, pneumothorax, ACS, thoracic trauma, and pulmonary embolism. Peripheral edema, DVT As part of my medical decision making, I reviewed the following data within the electronic MEDICAL RECORD NUMBER Notes from prior ED visits  History of atrial fibrillation as noted on the patient's previous discharge summary from early  June of this year.   ----------------------------------------- 11:08 PM on 06/13/2018 -----------------------------------------  Patient at this time with chest x-ray which is resulted but pending the rest of her work-up.  Signed out to Dr. Zenda Alpers.  Likely to require Lasix and admission.  ____________________________________________   FINAL CLINICAL IMPRESSION(S) / ED DIAGNOSES  CHF.    NEW MEDICATIONS STARTED DURING THIS VISIT:  New Prescriptions   No medications on file     Note:  This document was prepared using Dragon voice recognition software and may include unintentional dictation errors.     Myrna Blazer, MD 06/13/18 [phone]

## 2018-06-14 ENCOUNTER — Inpatient Hospital Stay (HOSPITAL_COMMUNITY)
Admit: 2018-06-14 | Discharge: 2018-06-14 | Disposition: A | Discharge status: Home / Self Care | Payer: Medicare Other | Attending: Internal Medicine | Admitting: Internal Medicine

## 2018-06-14 ENCOUNTER — Encounter: Payer: Self-pay | Admitting: Physician Assistant

## 2018-06-14 DIAGNOSIS — I5042 Chronic combined systolic (congestive) and diastolic (congestive) heart failure: Secondary | ICD-10-CM | POA: Diagnosis present

## 2018-06-14 DIAGNOSIS — X58XXXD Exposure to other specified factors, subsequent encounter: Secondary | ICD-10-CM | POA: Diagnosis present

## 2018-06-14 DIAGNOSIS — J40 Bronchitis, not specified as acute or chronic: Secondary | ICD-10-CM | POA: Diagnosis not present

## 2018-06-14 DIAGNOSIS — I5021 Acute systolic (congestive) heart failure: Secondary | ICD-10-CM | POA: Diagnosis not present

## 2018-06-14 DIAGNOSIS — R490 Dysphonia: Secondary | ICD-10-CM | POA: Diagnosis present

## 2018-06-14 DIAGNOSIS — M1990 Unspecified osteoarthritis, unspecified site: Secondary | ICD-10-CM | POA: Diagnosis present

## 2018-06-14 DIAGNOSIS — I10 Essential (primary) hypertension: Secondary | ICD-10-CM | POA: Diagnosis not present

## 2018-06-14 DIAGNOSIS — R269 Unspecified abnormalities of gait and mobility: Secondary | ICD-10-CM | POA: Diagnosis present

## 2018-06-14 DIAGNOSIS — Z79891 Long term (current) use of opiate analgesic: Secondary | ICD-10-CM | POA: Diagnosis not present

## 2018-06-14 DIAGNOSIS — Z96652 Presence of left artificial knee joint: Secondary | ICD-10-CM | POA: Diagnosis present

## 2018-06-14 DIAGNOSIS — I509 Heart failure, unspecified: Secondary | ICD-10-CM | POA: Diagnosis not present

## 2018-06-14 DIAGNOSIS — I481 Persistent atrial fibrillation: Secondary | ICD-10-CM

## 2018-06-14 DIAGNOSIS — Z7902 Long term (current) use of antithrombotics/antiplatelets: Secondary | ICD-10-CM | POA: Diagnosis not present

## 2018-06-14 DIAGNOSIS — R531 Weakness: Secondary | ICD-10-CM | POA: Diagnosis not present

## 2018-06-14 DIAGNOSIS — D649 Anemia, unspecified: Secondary | ICD-10-CM | POA: Diagnosis present

## 2018-06-14 DIAGNOSIS — I340 Nonrheumatic mitral (valve) insufficiency: Secondary | ICD-10-CM

## 2018-06-14 DIAGNOSIS — E876 Hypokalemia: Secondary | ICD-10-CM | POA: Diagnosis not present

## 2018-06-14 DIAGNOSIS — E785 Hyperlipidemia, unspecified: Secondary | ICD-10-CM | POA: Diagnosis present

## 2018-06-14 DIAGNOSIS — Y92230 Patient room in hospital as the place of occurrence of the external cause: Secondary | ICD-10-CM | POA: Diagnosis not present

## 2018-06-14 DIAGNOSIS — I110 Hypertensive heart disease with heart failure: Secondary | ICD-10-CM | POA: Diagnosis present

## 2018-06-14 DIAGNOSIS — S72009D Fracture of unspecified part of neck of unspecified femur, subsequent encounter for closed fracture with routine healing: Secondary | ICD-10-CM | POA: Diagnosis not present

## 2018-06-14 DIAGNOSIS — Z882 Allergy status to sulfonamides status: Secondary | ICD-10-CM | POA: Diagnosis not present

## 2018-06-14 DIAGNOSIS — Z8249 Family history of ischemic heart disease and other diseases of the circulatory system: Secondary | ICD-10-CM | POA: Diagnosis not present

## 2018-06-14 DIAGNOSIS — J9811 Atelectasis: Secondary | ICD-10-CM | POA: Diagnosis present

## 2018-06-14 DIAGNOSIS — I739 Peripheral vascular disease, unspecified: Secondary | ICD-10-CM | POA: Diagnosis not present

## 2018-06-14 DIAGNOSIS — D696 Thrombocytopenia, unspecified: Secondary | ICD-10-CM | POA: Diagnosis present

## 2018-06-14 DIAGNOSIS — Z7901 Long term (current) use of anticoagulants: Secondary | ICD-10-CM | POA: Diagnosis not present

## 2018-06-14 DIAGNOSIS — T502X5A Adverse effect of carbonic-anhydrase inhibitors, benzothiadiazides and other diuretics, initial encounter: Secondary | ICD-10-CM | POA: Diagnosis not present

## 2018-06-14 DIAGNOSIS — I5023 Acute on chronic systolic (congestive) heart failure: Secondary | ICD-10-CM | POA: Diagnosis present

## 2018-06-14 DIAGNOSIS — Z823 Family history of stroke: Secondary | ICD-10-CM | POA: Diagnosis not present

## 2018-06-14 DIAGNOSIS — I255 Ischemic cardiomyopathy: Secondary | ICD-10-CM | POA: Diagnosis not present

## 2018-06-14 DIAGNOSIS — R0602 Shortness of breath: Secondary | ICD-10-CM | POA: Diagnosis not present

## 2018-06-14 DIAGNOSIS — Z79899 Other long term (current) drug therapy: Secondary | ICD-10-CM | POA: Diagnosis not present

## 2018-06-14 LAB — BASIC METABOLIC PANEL
Anion gap: 9 (ref 5–15)
Anion gap: 10 (ref 5–15)
BUN: 11 mg/dL (ref 8–23)
BUN: 9 mg/dL (ref 8–23)
CHLORIDE: 109 mmol/L (ref 98–111)
CO2: 24 mmol/L (ref 22–32)
CO2: 23 mmol/L (ref 22–32)
CREATININE: 0.77 mg/dL (ref 0.44–1.00)
Calcium: 9 mg/dL (ref 8.9–10.3)
Calcium: 8.6 mg/dL — ABNORMAL LOW (ref 8.9–10.3)
Chloride: 109 mmol/L (ref 98–111)
Creatinine, Ser: 0.71 mg/dL (ref 0.44–1.00)
GFR calc Af Amer: >60 mL/min (ref >60)
GFR calc Af Amer: >60 mL/min (ref >60)
GLUCOSE: 111 mg/dL — AB (ref 70–99)
GLUCOSE: 90 mg/dL (ref 70–99)
POTASSIUM: 3.8 mmol/L (ref 3.5–5.1)
Potassium: 3.8 mmol/L (ref 3.5–5.1)
SODIUM: 142 mmol/L (ref 135–145)
Sodium: 142 mmol/L (ref 135–145)

## 2018-06-14 LAB — ECHOCARDIOGRAM COMPLETE
Height: 66 in
WEIGHTICAEL: 2812.8 [oz_av]

## 2018-06-14 LAB — CBC
HCT: 29.8 % — ABNORMAL LOW (ref 35.0–47.0)
Hemoglobin: 10 g/dL — ABNORMAL LOW (ref 12.0–16.0)
MCH: 32.2 pg (ref 26.0–34.0)
MCHC: 33.6 g/dL (ref 32.0–36.0)
MCV: 95.7 fL (ref 80.0–100.0)
PLATELETS: 118 10*3/uL — AB (ref 150–440)
RBC: 3.12 MIL/uL — ABNORMAL LOW (ref 3.80–5.20)
RDW: 16.7 % — AB (ref 11.5–14.5)
WBC: 6.2 10*3/uL (ref 3.6–11.0)

## 2018-06-14 LAB — TROPONIN I
TROPONIN I: 0.06 ng/mL — AB (ref <0.03)
Troponin I: 0.06 ng/mL (ref <0.03)
Troponin I: 0.05 ng/mL (ref <0.03)
Troponin I: 0.05 ng/mL (ref <0.03)

## 2018-06-14 LAB — BRAIN NATRIURETIC PEPTIDE: B NATRIURETIC PEPTIDE 5: 2611 pg/mL — AB (ref 0.0–100.0)

## 2018-06-14 MED ORDER — CLOPIDOGREL BISULFATE 75 MG PO TABS
75.0000 mg | ORAL_TABLET | Freq: Every day | ORAL | Status: DC
  Administered 2018-06-14: 75 mg via ORAL
  Filled 2018-06-14 (×2): qty 1

## 2018-06-14 MED ORDER — APIXABAN 5 MG PO TABS
5.0000 mg | ORAL_TABLET | Freq: Two times a day (BID) | ORAL | Status: DC
  Administered 2018-06-14 – 2018-06-18 (×9): 5 mg via ORAL
  Filled 2018-06-14 (×9): qty 1

## 2018-06-14 MED ORDER — POTASSIUM CHLORIDE CRYS ER 20 MEQ PO TBCR
20.0000 meq | EXTENDED_RELEASE_TABLET | Freq: Two times a day (BID) | ORAL | Status: DC
  Administered 2018-06-14 – 2018-06-16 (×6): 20 meq via ORAL
  Filled 2018-06-14 (×6): qty 1

## 2018-06-14 MED ORDER — ATORVASTATIN CALCIUM 20 MG PO TABS
20.0000 mg | ORAL_TABLET | Freq: Every day | ORAL | Status: DC
  Administered 2018-06-14 – 2018-06-17 (×4): 20 mg via ORAL
  Filled 2018-06-14 (×4): qty 1

## 2018-06-14 MED ORDER — LABETALOL HCL 100 MG PO TABS
100.0000 mg | ORAL_TABLET | Freq: Two times a day (BID) | ORAL | Status: DC
  Filled 2018-06-14 (×2): qty 1

## 2018-06-14 MED ORDER — AMLODIPINE BESYLATE 5 MG PO TABS
5.0000 mg | ORAL_TABLET | Freq: Every day | ORAL | Status: DC
  Administered 2018-06-14: 5 mg via ORAL
  Filled 2018-06-14: qty 1

## 2018-06-14 MED ORDER — FUROSEMIDE 10 MG/ML IJ SOLN
20.0000 mg | Freq: Once | INTRAMUSCULAR | Status: AC
  Administered 2018-06-14: 20 mg via INTRAVENOUS
  Filled 2018-06-14: qty 2

## 2018-06-14 MED ORDER — HYDROCODONE-ACETAMINOPHEN 5-325 MG PO TABS
1.0000 | ORAL_TABLET | ORAL | Status: DC | PRN
  Administered 2018-06-16: 1 via ORAL
  Filled 2018-06-14: qty 1

## 2018-06-14 MED ORDER — ACETAMINOPHEN 325 MG PO TABS
650.0000 mg | ORAL_TABLET | Freq: Four times a day (QID) | ORAL | Status: DC | PRN
  Administered 2018-06-14 – 2018-06-16 (×2): 650 mg via ORAL
  Filled 2018-06-14 (×2): qty 2

## 2018-06-14 MED ORDER — ONDANSETRON HCL 4 MG/2ML IJ SOLN: 4.0000 mg | Freq: Four times a day (QID) | INTRAMUSCULAR | Status: DC | PRN

## 2018-06-14 MED ORDER — FUROSEMIDE 10 MG/ML IJ SOLN
40.0000 mg | Freq: Two times a day (BID) | INTRAMUSCULAR | Status: DC
  Administered 2018-06-14 – 2018-06-17 (×7): 40 mg via INTRAVENOUS
  Filled 2018-06-14 (×6): qty 4

## 2018-06-14 MED ORDER — FUROSEMIDE 10 MG/ML IJ SOLN
20.0000 mg | Freq: Once | INTRAMUSCULAR | Status: AC
  Administered 2018-06-14: 20 mg via INTRAVENOUS
  Filled 2018-06-14: qty 4

## 2018-06-14 MED ORDER — ACETAMINOPHEN 650 MG RE SUPP: 650.0000 mg | Freq: Four times a day (QID) | RECTAL | Status: DC | PRN

## 2018-06-14 MED ORDER — CARVEDILOL 12.5 MG PO TABS
12.5000 mg | ORAL_TABLET | Freq: Two times a day (BID) | ORAL | Status: DC
Start: 2018-06-14 — End: 2018-06-17
  Administered 2018-06-14 – 2018-06-16 (×6): 12.5 mg via ORAL
  Filled 2018-06-14 (×6): qty 1

## 2018-06-14 MED ORDER — ONDANSETRON HCL 4 MG PO TABS: 4.0000 mg | ORAL_TABLET | Freq: Four times a day (QID) | ORAL | Status: DC | PRN

## 2018-06-14 MED ORDER — LISINOPRIL 5 MG PO TABS
5.0000 mg | ORAL_TABLET | Freq: Every day | ORAL | Status: DC
  Administered 2018-06-14 – 2018-06-16 (×3): 5 mg via ORAL
  Filled 2018-06-14 (×3): qty 1

## 2018-06-14 MED ORDER — GUAIFENESIN 100 MG/5ML PO SOLN
5.0000 mL | ORAL | Status: DC | PRN
  Administered 2018-06-15 – 2018-06-18 (×7): 100 mg via ORAL
  Filled 2018-06-14 (×9): qty 5

## 2018-06-14 NOTE — Progress Notes (Signed)
Patient was transferred from the Er following c/o progressive SOB that started 2 days ago. Patient was accompanied by daughter. On admission patient was A&O X4, denied being in pain and was able to provide most of her admission documentation,  Patient was weaned off 1L of oxygen back to room air and was able to maintain O2sat ib the upper 90s. Patient skin was checked with another RN patient has about  2 inches healing incision on her left hip from a recent hip surgery.

## 2018-06-14 NOTE — ED Provider Notes (Signed)
-----------------------------------------   12:33 AM on 06/14/2018 -----------------------------------------   Blood pressure 112/88, pulse 83, temperature 98.6 F (37 C), temperature source Oral, resp. rate 19, height 5\' 5"  (1.651 m), weight 68 kg (150 lb), SpO2 100 %.  Assuming care from Dr. Pershing ProudSchaevitz.  In short, [name] is a 84 y.o. female with a chief complaint of Shortness of Breath .  Refer to the original H&P for additional details.  The current plan of care is to follow-up the lab results and disposition the patient.   The patient CBC is unremarkable aside from a hemoglobin of 10.7 hematocrit of 31.5.  The patient's BNP is negative but the patient's BNP is 2611 with a troponin of 0.06.  The patient's ultrasound does not show any evidence of left lower extremity DVT but the chest x-ray shows some vascular congestion with small bilateral pleural effusions and bibasilar atelectasis.  I feel that the patient may have some new onset heart failure.  I will give him a small dose of Lasix and he will be admitted to the hospitalist service for further evaluation.        Rebecka ApleyWebster, Allison P, MD 06/14/18 [phone]

## 2018-06-14 NOTE — ED Notes (Signed)
3external foley placed.

## 2018-06-14 NOTE — Progress Notes (Signed)
*  PRELIMINARY RESULTS* Echocardiogram 2D Echocardiogram has been performed.  Jordan GulaJoan M Natalie Hopkins 06/14/2018, 10:21 AM

## 2018-06-14 NOTE — Progress Notes (Signed)
[employer] [facility] [place] [facility]   [name]    MR#:  [id number]  DATE OF BIRTH:  [birth year]  SUBJECTIVE:   Patient here due to shortness of breath and noted to be in acute congestive heart failure.  Diuresed with IV Lasix and has improved.  Still has some left lower extremity edema but Dopplers were negative for DVT.  REVIEW OF SYSTEMS:    Review of Systems  Constitutional: Negative for chills and fever.  HENT: Negative for congestion and tinnitus.   Eyes: Negative for blurred vision and double vision.  Respiratory: Positive for shortness of breath. Negative for cough and wheezing.   Cardiovascular: Positive for leg swelling. Negative for chest pain, orthopnea and PND.  Gastrointestinal: Negative for abdominal pain, diarrhea, nausea and vomiting.  Genitourinary: Negative for dysuria and hematuria.  Neurological: Negative for dizziness, sensory change and focal weakness.  All other systems reviewed and are negative.   Nutrition: Heart Healthy/Carb modified Tolerating Diet: Yes Tolerating PT: Await Eval.   DRUG ALLERGIES:   Allergies  Allergen Reactions  . Sulfa Antibiotics Shortness Of Breath and Other (See Comments)    dizziness    VITALS:  Blood pressure 131/81, pulse 72, temperature 97.9 F (36.6 C), temperature source Oral, resp. rate 12, height 5\' 6"  (1.676 m), weight 79.7 kg (175 lb 12.8 oz), SpO2 98 %.  PHYSICAL EXAMINATION:   Physical Exam  GENERAL:  84 y.o.-year-old patient lying in bed in no acute distress.  EYES: Pupils equal, round, reactive to light and accommodation. No scleral icterus. Extraocular muscles intact.  HEENT: Head atraumatic, normocephalic. Oropharynx and nasopharynx clear.  NECK:  Supple, +  jugular venous distention. No thyroid enlargement, no tenderness.  LUNGS: Normal breath sounds bilaterally, no wheezing, basilar rales b/l, No rhonchi. No use of accessory muscles of respiration.  CARDIOVASCULAR: S1,  S2 normal. No murmurs, rubs, or gallops.  ABDOMEN: Soft, nontender, nondistended. Bowel sounds present. No organomegaly or mass.  EXTREMITIES: No cyanosis, clubbing, + 1 edema L>R.  NEUROLOGIC: Cranial nerves II through XII are intact. No focal Motor or sensory deficits b/l.  Globally weak.  PSYCHIATRIC: The patient is alert and oriented x 3.  SKIN: No obvious rash, lesion, or ulcer.    LABORATORY PANEL:   CBC Recent Labs  Lab 06/14/18 0930  WBC 6.2  HGB 10.0*  HCT 29.8*  PLT 118*   ------------------------------------------------------------------------------------------------------------------  Chemistries  Recent Labs  Lab 06/14/18 0930  NA 142  K 3.8  CL 109  CO2 23  GLUCOSE 90  BUN 9  CREATININE 0.71  CALCIUM 8.6*   ------------------------------------------------------------------------------------------------------------------  Cardiac Enzymes Recent Labs  Lab 06/14/18 1502  TROPONINI 0.06*   ------------------------------------------------------------------------------------------------------------------  RADIOLOGY:  Dg Chest 1 View  Result Date: 06/13/2018 CLINICAL DATA:  84 year old female with shortness of breath and left lower extremity edema. EXAM: CHEST  1 VIEW COMPARISON:  Chest radiograph dated 05/15/2018 FINDINGS: There is shallow inspiration. There are bilateral small pleural effusions with associated compressive atelectasis of the lung bases versus pneumonia. Clinical correlation is recommended. Bilateral perihilar and upper lobe streaky densities and vascular prominence most likely represent vascular congestion. There is no pneumothorax. Stable cardiac silhouette. No acute osseous pathology. Scoliosis. IMPRESSION: Vascular congestion with small bilateral pleural effusions and bibasilar atelectasis or pneumonia. These findings are new compared to prior radiograph. Clinical correlation is recommended. Electronically Signed   By: Elgie Collard M.D.    On: 06/13/2018 23:05   US Venous Img Lower Unilateral  Left  Result Date: 06/14/2018 CLINICAL DATA:  84 year old female with LEFT leg swelling for 2 days. Recent LEFT hip surgery. EXAM: LEFT LOWER EXTREMITY VENOUS DOPPLER ULTRASOUND TECHNIQUE: Gray-scale sonography with graded compression, as well as color Doppler and duplex ultrasound were performed to evaluate the lower extremity deep venous systems from the level of the common femoral vein and including the common femoral, femoral, profunda femoral, popliteal and calf veins including the posterior tibial, peroneal and gastrocnemius veins when visible. The superficial great saphenous vein was also interrogated. Spectral Doppler was utilized to evaluate flow at rest and with distal augmentation maneuvers in the common femoral, femoral and popliteal veins. COMPARISON:  None. FINDINGS: Normal flow, compressibility, and augmentation within the distal common femoral, proximal profunda femoral, proximal greater saphenous, entire femoral, popliteal veins, and imaged calf veins. Subcutaneous edema noted. IMPRESSION: No evidence of LEFT LOWER extremity deep venous thrombosis. Electronically Signed   By: Harmon PierJeffrey  Hu M.D.   On: 06/14/2018 00:16     ASSESSMENT AND PLAN:   84 year old female with past medical history of chronic atrial fibrillation, hypertension, chronic diastolic CHF, osteoarthritis, peripheral artery disease who presented to the hospital due to shortness of breath, lower extremity edema.  1.  CHF-acute on chronic diastolic dysfunction.  Patient has had about a 10 to 12 pound weight gain at the skilled nursing facility.  -Improved with some diuresis yesterday.  Continue IV Lasix, follow I's and O's and daily weights. -Appreciate cardiology input, change from labetalol to Coreg -Low-dose ACE inhibitor started. -Await echocardiogram results.  Appreciate cardiology consult.  2.  Essential hypertension-continue carvedilol, lisinopril.  3.   Chronic atrial fibrillation-rate controlled.  Continue carvedilol.  Continue Eliquis.  4.  Hyperlipidemia-continue atorvastatin.  5.  Elevated troponin-likely supply demand ischemia from underlying CHF.  Troponins have remained flat. -Await echo results, further care as per cardiology.     All the records are reviewed and case discussed with Care Management/Social Worker. Management plans discussed with the patient, family and they are in agreement.  CODE STATUS: Full code  DVT Prophylaxis: Lovenox  TOTAL TIME TAKING CARE OF THIS PATIENT: 30 minutes.   POSSIBLE D/C IN 1-2 DAYS, DEPENDING ON CLINICAL CONDITION.   Houston SirenSAINANI,Anterrio Mccleery J M.D on 06/14/2018 at 4:16 PM  Between 7am to 6pm - Pager - [phone]  After 6pm go to [email or web address] - Social research officer, governmentpassword EPAS [facility]  [facility] [place]  Office  [phone]  CC: Primary care physician; [name], Narges, MD

## 2018-06-14 NOTE — Consult Note (Signed)
Cardiology Consultation:   Patient ID: [name]; [id number]; 18-Oct-1931   Admit date: 06/13/2018 Date of Consult: 06/14/2018  Primary Care Provider: [name], Narges, MD Primary Cardiologist: Kirke CorinArida   Patient Profile:   [name] is a 84 y.o. female with a hx of recently diagnosed Afib in 03/2018 on Eliquis, chronic diastolic CHF, stroke, PVD s/p right atherectomy on Plavix, CKD stage III, recent left hip fracture in 05/2018 s/p ORIF, and HTN who is being seen today for the evaluation of SOB at the request of Dr. Anne HahnWillis.  History of Present Illness:   [name] was [facility] with bilateral lower extremity weakness and noted to be in new onset Afib. She was started on Eliquis at that time. 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 Evidence of high-grade stenosis of multiple vessels. She was also noted to be positive for influenza A. Echo in 03/2018 showed normal LV systolic function with moderate LVH, normal RV systolic function, mild MR, trivial TR, no valvular stenoses. She was most recently [facility] in early 05/2018 with a mechanical fall leading to a fractured left hip. Her Eliquis was held and she underwent successful ORIF on 6/3. She was resumed on Eliquis at discharge. Documented discharge weight of 150 pounds. Upon arrival to rehab on 6/5 her weight was noted to be 162 pounds per the patient's daughter. Throughout her stay at rehab she continued to slowly gain weight and on the day of her discharge from rehab (6/28) she had a weight of 172 pounds. She did well on 6/29-6/30. However, on 7/1 she began to develop increased SOB occurring with exertion and noted at rest. She was not weighing herself at home. She noted worsening lower extremity swelling, left greater than right, abdominal fullness,  orthopnea, and early satiety. She noted her weakness had returned. She reported compliance with Eliquis and has not missed a dose. She was compliant with Lasix 20 mg daily as well.   [facility] she was noted to weight 175 pounds, BP elevated into the 150s systolic, oxygen saturation 97% on nasal cannula-->now on room air. CXR showed vascular congestion with bilateral pleural effusions, BNP 2,611, troponin 0.06-->0.05,  SCr 0.77, K+ 3.8, WBC 6.4, HGB 10.7, PLT 146. Left lower extremity ultrasound negative for DVT. She arriving she has received IV Lasix 20 mg x 2 with a documented UOP of 1.1 L, though patient's daughter indicates the patient has filled up 2 Foley containers. She notes her SOB is much improved at rest, though has not yet ambulated to assess her symptoms. She was noted to be in Afib with controlled ventricular response upon admission and is now in sinus rhythm.   Past Medical History:  Diagnosis Date  . Arthritis    "maybe a little" (03/24/2017)  . Chronic diastolic CHF (congestive heart failure) (HCC)    a. TTE 4/19: nl LVSF, nl RVSF, mod LVH, mild MR, trivial TR  . Dizziness   . Fall    a. fractured left hip 05/2018 s/p ORIF  . Hypertension   . PAD (peripheral artery disease) (HCC)   . Persistent atrial fibrillation (HCC)    a. diagnosed 03/2018; b. CHADS2VASc 8 (CHF, HTN, age x 2, stroke x 2, vascular disease, female); c. Eliquis    Past Surgical History:  Procedure Laterality Date  . ABDOMINAL AORTIC ANEURYSM REPAIR     hx/notes 07/26/2012  . ABDOMINAL AORTOGRAM N/A 02/24/2017   Procedure: Abdominal Aortogram;  Surgeon: Iran Ouch, MD;  Location: [facility] INVASIVE CV LAB;  Service: Cardiovascular;  Laterality: N/A;  . HERNIA REPAIR    . INTRAMEDULLARY (IM) NAIL INTERTROCHANTERIC Left 05/16/2018   Procedure: INTRAMEDULLARY (IM) NAIL INTERTROCHANTRIC;  Surgeon: Kennedy Bucker, MD;  Location: [facility] ORS;  Service: Orthopedics;  Laterality: Left;  . JOINT  REPLACEMENT    . LAPAROSCOPIC INCISIONAL / UMBILICAL / VENTRAL HERNIA REPAIR  2010   VHR w/mesh hx/notes 07/26/2012  . LOWER EXTREMITY ANGIOGRAPHY Bilateral 02/24/2017   Procedure: Lower Extremity Angiography;  Surgeon: Iran Ouch, MD;  Location: [facility] INVASIVE CV LAB;  Service: Cardiovascular;  Laterality: Bilateral;  . PERIPHERAL VASCULAR ATHERECTOMY  03/24/2017  . PERIPHERAL VASCULAR ATHERECTOMY Right 03/24/2017   Procedure: Peripheral Vascular Atherectomy;  Surgeon: Iran Ouch, MD;  Location: [facility] INVASIVE CV LAB;  Service: Cardiovascular;  Laterality: Right;  . TOTAL KNEE ARTHROPLASTY Left      Home Meds: Prior to Admission medications   Medication Sig Start Date End Date Taking? Authorizing Provider  amLODipine (NORVASC) 5 MG tablet Take 5 mg by mouth daily. 04/29/18  Yes [provider]  atorvastatin (LIPITOR) 20 MG tablet Take 1 tablet (20 mg total) by mouth daily at 6 PM. 03/25/17  Yes Laverda Page B, NP  cholecalciferol (VITAMIN D) 1000 units tablet Take 1,000 Units by mouth daily.    Yes [provider]  clopidogrel (PLAVIX) 75 MG tablet Take 1 tablet (75 mg total) by mouth daily. 03/11/17  Yes Iran Ouch, MD  docusate sodium (COLACE) 100 MG capsule Take 1 capsule (100 mg total) by mouth 2 (two) times daily. 05/18/18  Yes Auburn Bilberry, MD  ELIQUIS 5 MG TABS tablet Take 5 mg by mouth 2 (two) times daily. 05/04/18  Yes [provider]  furosemide (LASIX) 20 MG tablet Take 10 mg by mouth daily with breakfast.  06/20/17  Yes [provider]  HYDROcodone-acetaminophen (NORCO/VICODIN) 5-325 MG tablet Take 1-2 tablets by mouth every 4 (four) hours as needed for moderate pain. 05/18/18  Yes Evon Slack, PA-C  labetalol (NORMODYNE) 100 MG tablet Take 100 mg by mouth 2 (two) times daily.  06/15/15  Yes [provider]    Inpatient Medications: Scheduled Meds: . amLODipine  5 mg Oral Daily  . apixaban  5 mg Oral BID  . atorvastatin   20 mg Oral q1800  . carvedilol  12.5 mg Oral BID WC  . clopidogrel  75 mg Oral Daily  . furosemide  40 mg Intravenous BID  . potassium chloride  20 mEq Oral BID   Continuous Infusions:  PRN Meds: acetaminophen **OR** acetaminophen, HYDROcodone-acetaminophen, ondansetron **OR** ondansetron (ZOFRAN) IV  Allergies:   Allergies  Allergen Reactions  . Sulfa Antibiotics Shortness Of Breath and Other (See Comments)    dizziness    Social History:   Social History   Socioeconomic History  . Marital status: Widowed    Spouse name: Not on file  . Number of children: Not on file  . Years of education: Not on file  . Highest education level: Not on file  Occupational History  . Not on file  Social Needs  . Financial resource strain: Not on file  . Food insecurity:    Worry: Not on file    Inability: Not on file  . Transportation needs:  Medical: Not on file    Non-medical: Not on file  Tobacco Use  . Smoking status: Never Smoker  . Smokeless tobacco: Never Used  Substance and Sexual Activity  . Alcohol use: No    Alcohol/week: 0.0 oz  . Drug use: No  . Sexual activity: Never  Lifestyle  . Physical activity:    Days per week: Not on file    Minutes per session: Not on file  . Stress: Not on file  Relationships  . Social connections:    Talks on phone: Not on file    Gets together: Not on file    Attends religious service: Not on file    Active member of club or organization: Not on file    Attends meetings of clubs or organizations: Not on file    Relationship status: Not on file  . Intimate partner violence:    Fear of current or ex partner: Not on file    Emotionally abused: Not on file    Physically abused: Not on file    Forced sexual activity: Not on file  Other Topics Concern  . Not on file  Social History Narrative  . Not on file     Family History:   Family History  Problem Relation Age of Onset  . Hypertension Mother   . Heart attack Father   .  Hypertension Sister   . Cancer Brother   . Stroke Brother     ROS:  Review of Systems  Constitutional: Positive for malaise/fatigue. Negative for chills, diaphoresis, fever and weight loss.  HENT: Negative for congestion.   Eyes: Negative for discharge and redness.  Respiratory: Positive for cough. Negative for hemoptysis, sputum production, shortness of breath and wheezing.   Cardiovascular: Positive for leg swelling. Negative for chest pain, palpitations, orthopnea, claudication and PND.  Gastrointestinal: Negative for abdominal pain, blood in stool, heartburn, melena, nausea and vomiting.  Genitourinary: Negative for hematuria.  Musculoskeletal: Negative for falls and myalgias.  Skin: Negative for rash.  Neurological: Positive for weakness. Negative for dizziness, tingling, tremors, sensory change, speech change, focal weakness and loss of consciousness.  Endo/Heme/Allergies: Does not bruise/bleed easily.  Psychiatric/Behavioral: Negative for substance abuse. The patient is not nervous/anxious.   All other systems reviewed and are negative.     Physical Exam/Data:   Vitals:   06/14/18 0145 06/14/18 0200 06/14/18 0215 06/14/18 0249  BP: (!) 150/81 (!) 146/74 (!) 147/74 (!) 150/83  Pulse: 86 86 84 87  Resp: 16 19 16 17   Temp:    98.4 F (36.9 C)  TempSrc:    Oral  SpO2: 100% 100% 100% 100%  Weight:   175 lb 12.8 oz (79.7 kg)   Height:   5\' 6"  (1.676 m)     Intake/Output Summary (Last 24 hours) at 06/14/2018 0812 Last data filed at 06/14/2018 0726 Gross per 24 hour  Intake -  Output 1950 ml  Net -1950 ml   Filed Weights   06/13/18 2233 06/14/18 0215  Weight: 150 lb (68 kg) 175 lb 12.8 oz (79.7 kg)   Body mass index is 28.37 kg/m.   Physical Exam: General: Well developed, well nourished, in no acute distress. Head: Normocephalic, atraumatic, sclera non-icteric, no xanthomas, nares without discharge.  Neck: Left sided carotid bruit noted. JVD elevated to the angle of  the mandible. Lungs: Diminished breath sounds bilaterally with bilateral crackles along the bases. Breathing is unlabored. Heart: RRR with S1 S2. No murmurs, rubs, or gallops appreciated. Abdomen:  Soft, non-tender, distended with normoactive bowel sounds. No hepatomegaly. No rebound/guarding. No obvious abdominal masses. Msk:  Strength and tone appear normal for age. Extremities: No clubbing or cyanosis. 1-2+ bilateral lower extremity pitting edema. Distal pedal pulses are 1+ and equal bilaterally. Neuro: Alert and oriented X 3. No facial asymmetry. No focal deficit. Moves all extremities spontaneously. Psych:  Responds to questions appropriately with a normal affect.   EKG:  The EKG was personally reviewed and demonstrates: Afib, 79 bpm, possible prior anteroseptal infarct, nonspecific st/t changes Telemetry:  Telemetry was personally reviewed and demonstrates: sinus rhythm, 1st degree AV block  Weights: Filed Weights   06/13/18 2233 06/14/18 0215  Weight: 150 lb (68 kg) 175 lb 12.8 oz (79.7 kg)    Relevant CV Studies: Echo 03/2018: INTERPRETATION NORMAL LEFT VENTRICULAR SYSTOLIC FUNCTION WITH MODERATE LVH NORMAL RIGHT VENTRICULAR SYSTOLIC FUNCTION VALVULAR REGURGITATION: MILD MR, TRIVIAL TR NO VALVULAR STENOSIS NO PRIOR STUDY FOR COMPARISON  Laboratory Data:  Chemistry Recent Labs  Lab 06/13/18 2244  NA 142  K 3.8  CL 109  CO2 24  GLUCOSE 111*  BUN 11  CREATININE 0.77  CALCIUM 9.0  GFRNONAA >60  GFRAA >60  ANIONGAP 9    No results for input(s): PROT, ALBUMIN, AST, ALT, ALKPHOS, BILITOT in the last 168 hours. Hematology Recent Labs  Lab 06/13/18 2244  WBC 6.4  RBC 3.25*  HGB 10.7*  HCT 31.5*  MCV 97.0  MCH 32.8  MCHC 33.9  RDW 17.3*  PLT 146*   Cardiac Enzymes Recent Labs  Lab 06/13/18 2244 06/14/18 0340  TROPONINI 0.06* 0.05*   No results for input(s): TROPIPOC in the last 168 hours.  BNP Recent Labs  Lab 06/13/18 2244  BNP 2,611.0*      DDimer No results for input(s): DDIMER in the last 168 hours.  Radiology/Studies:  Dg Chest 1 View  Result Date: 06/13/2018 IMPRESSION: Vascular congestion with small bilateral pleural effusions and bibasilar atelectasis or pneumonia. These findings are new compared to prior radiograph. Clinical correlation is recommended. Electronically Signed   By: Elgie Collard M.D.   On: 06/13/2018 23:05   US Venous Img Lower Unilateral Left  Result Date: 06/14/2018 IMPRESSION: No evidence of LEFT LOWER extremity deep venous thrombosis. Electronically Signed   By: Harmon Pier M.D.   On: 06/14/2018 00:16    Assessment and Plan:   1. Acute on chronic diastolic CHF: -Remains volume up -Reported discharge weight on 6/5 of 150 pounds, however, this appears to be inaccurate as her weight upon arriving to rehab on 6/5 was noted to be 162 pounds per patient's daughter. While at rehab through 6/28, she continued to gain weight and had a discharge weight of 172 pounds on 6/28 -She has received IV Lasix 20 mg x 2 since arrival to the ED with no diuretic ordered upon admission -Start IV Lasix 40 mg bid with KCl repletion  -Check echo -Change labetalol to Coreg -Given her lower extremity swelling, consider alternative antihypertensive to amlodipine, defer to IM  2. Persistent Afib: -Currently in sinus rhythm -Change labetalol to Coreg as above -Eliquis 5 mg bid -CHADS2VASc at least 8 (CHF, HTN, age x 2, stroke x 2, vascular disease, female)  3. Elevated troponin: -Minimally elevated, likely in the setting of supply demand ischemia secondary to volume overload -Peaked at 0.06 -Check echo as above -No plans for inpatient ischemic evaluation at this time unless there are findings on echo concerning for ischemia -No heparin gtt -On Eliquis in place  of ASA, on Plavix given PVD  4. PVD: -Plavix -Outpatient follow up  5. Recent hip fracture s/p ORIF: -Per IM  6. Anemia: -Stable -Per IM  7.  Hyperglycemia: -Recommend A1c   For questions or updates, please contact [facility] Please consult [email or web address] for contact info under Cardiology/STEMI.   Signed, Eula Listen, PA-C [facility] Pager: [phone] 06/14/2018, 8:12 AM

## 2018-06-14 NOTE — H&P (Signed)
[facility] [place] at [facility]   [name]    MR#:  [id number]  DATE OF BIRTH:  August 02, 1931  DATE OF ADMISSION:  06/13/2018  PRIMARY CARE PHYSICIAN: Guy Begin, MD   REQUESTING/REFERRING PHYSICIAN: Zenda Alpers, MD  CHIEF COMPLAINT:   Chief Complaint  Patient presents with  . Shortness of Breath    HISTORY OF PRESENT ILLNESS:  [name]  is a 84 y.o. female who presents with shortness of breath and lower extremity edema.  Patient states this is been progressing for the past couple of days.  She was recently here in the hospital with hip repair.  Work-up in the ED indicates likely heart failure.  Patient does not have a prior diagnosis of this.  Her BNP was significantly elevated at greater than 2000.  She had pulmonary edema and some small effusions on her chest x-ray.  Hospitalist were called for admission  PAST MEDICAL HISTORY:   Past Medical History:  Diagnosis Date  . Arthritis    "maybe a little" (03/24/2017)  . Dizziness   . Hypertension   . PAD (peripheral artery disease) (HCC)      PAST SURGICAL HISTORY:   Past Surgical History:  Procedure Laterality Date  . ABDOMINAL AORTIC ANEURYSM REPAIR     hx/notes 07/26/2012  . ABDOMINAL AORTOGRAM N/A 02/24/2017   Procedure: Abdominal Aortogram;  Surgeon: Iran Ouch, MD;  Location: [facility];  Service: Cardiovascular;  Laterality: N/A;  . HERNIA REPAIR    . INTRAMEDULLARY (IM) NAIL INTERTROCHANTERIC Left 05/16/2018   Procedure: INTRAMEDULLARY (IM) NAIL INTERTROCHANTRIC;  Surgeon: Kennedy Bucker, MD;  Location: [facility] ORS;  Service: Orthopedics;  Laterality: Left;  . JOINT REPLACEMENT    . LAPAROSCOPIC INCISIONAL / UMBILICAL / VENTRAL HERNIA REPAIR  2010   VHR w/mesh hx/notes 07/26/2012  . LOWER EXTREMITY ANGIOGRAPHY Bilateral 02/24/2017   Procedure: Lower Extremity Angiography;  Surgeon: Iran Ouch, MD;  Location: [facility] CV LAB;  Service: Cardiovascular;   Laterality: Bilateral;  . PERIPHERAL VASCULAR ATHERECTOMY  03/24/2017  . PERIPHERAL VASCULAR ATHERECTOMY Right 03/24/2017   Procedure: Peripheral Vascular Atherectomy;  Surgeon: Iran Ouch, MD;  Location: [facility];  Service: Cardiovascular;  Laterality: Right;  . TOTAL KNEE ARTHROPLASTY Left      SOCIAL HISTORY:   Social History   Tobacco Use  . Smoking status: Never Smoker  . Smokeless tobacco: Never Used  Substance Use Topics  . Alcohol use: No    Alcohol/week: 0.0 oz     FAMILY HISTORY:   Family History  Problem Relation Age of Onset  . Hypertension Mother   . Heart attack Father   . Hypertension Sister   . Cancer Brother   . Stroke Brother      DRUG ALLERGIES:   Allergies  Allergen Reactions  . Sulfa Antibiotics Shortness Of Breath and Other (See Comments)    dizziness    MEDICATIONS AT HOME:   Prior to Admission medications   Medication Sig Start Date End Date Taking? Authorizing Provider  amLODipine (NORVASC) 5 MG tablet Take 5 mg by mouth daily. 04/29/18   [provider]  atorvastatin (LIPITOR) 20 MG tablet Take 1 tablet (20 mg total) by mouth daily at 6 PM. 03/25/17   Laverda Page B, NP  cholecalciferol (VITAMIN D) 1000 units tablet Take 1,000 Units by mouth daily.     [provider]  clopidogrel (PLAVIX) 75 MG tablet Take 1 tablet (75 mg total)  by mouth daily. Patient not taking: Reported on 05/15/2018 03/11/17   Iran OuchArida, Muhammad A, MD  docusate sodium (COLACE) 100 MG capsule Take 1 capsule (100 mg total) by mouth 2 (two) times daily. 05/18/18   Auburn BilberryPatel, Shreyang, MD  ELIQUIS 5 MG TABS tablet Take 5 mg by mouth 2 (two) times daily. 05/04/18   [provider]  furosemide (LASIX) 20 MG tablet Take 10 mg by mouth daily with breakfast.  06/20/17   [provider]  HYDROcodone-acetaminophen (NORCO/VICODIN) 5-325 MG tablet Take 1-2 tablets by mouth every 4 (four) hours as needed for moderate pain. 05/18/18   Evon SlackGaines,  Thomas C, PA-C  labetalol (NORMODYNE) 100 MG tablet Take 100 mg by mouth 2 (two) times daily.  06/15/15   [provider]    REVIEW OF SYSTEMS:  Review of Systems  Constitutional: Negative for chills, fever, malaise/fatigue and weight loss.  HENT: Negative for ear pain, hearing loss and tinnitus.   Eyes: Negative for blurred vision, double vision, pain and redness.  Respiratory: Positive for shortness of breath. Negative for cough and hemoptysis.   Cardiovascular: Positive for leg swelling. Negative for chest pain, palpitations and orthopnea.  Gastrointestinal: Negative for abdominal pain, constipation, diarrhea, nausea and vomiting.  Genitourinary: Negative for dysuria, frequency and hematuria.  Musculoskeletal: Negative for back pain, joint pain and neck pain.  Skin:       No acne, rash, or lesions  Neurological: Negative for dizziness, tremors, focal weakness and weakness.  Endo/Heme/Allergies: Negative for polydipsia. Does not bruise/bleed easily.  Psychiatric/Behavioral: Negative for depression. The patient is not nervous/anxious and does not have insomnia.      VITAL SIGNS:   Vitals:   06/13/18 2224 06/13/18 2233 06/13/18 2245 06/13/18 2247  BP: (!) 150/85  112/88   Pulse: 86  83   Resp: (!) 23  19   Temp: 98.4 F (36.9 C)   98.6 F (37 C)  TempSrc: Oral   Oral  SpO2: 97%  100%   Weight:  68 kg (150 lb)    Height:  5\' 5"  (1.651 m)     Wt Readings from Last 3 Encounters:  06/13/18 68 kg (150 lb)  05/15/18 68 kg (150 lb)  08/24/17 67.9 kg (149 lb 12 oz)    PHYSICAL EXAMINATION:  Physical Exam  Vitals reviewed. Constitutional: She is oriented to person, place, and time. She appears well-developed and well-nourished. No distress.  HENT:  Head: Normocephalic and atraumatic.  Mouth/Throat: Oropharynx is clear and moist.  Eyes: Pupils are equal, round, and reactive to light. Conjunctivae and EOM are normal. No scleral icterus.  Neck: Normal range of motion.  Neck supple. No JVD present. No thyromegaly present.  Cardiovascular: Normal rate, regular rhythm and intact distal pulses. Exam reveals no gallop and no friction rub.  No murmur heard. Respiratory: Effort normal. No respiratory distress. She has no wheezes. She has rales.  GI: Soft. Bowel sounds are normal. She exhibits no distension. There is no tenderness.  Musculoskeletal: Normal range of motion. She exhibits edema.  No arthritis, no gout  Lymphadenopathy:    She has no cervical adenopathy.  Neurological: She is alert and oriented to person, place, and time. No cranial nerve deficit.  No dysarthria, no aphasia  Skin: Skin is warm and dry. No rash noted. No erythema.  Psychiatric: She has a normal mood and affect. Her behavior is normal. Judgment and thought content normal.    LABORATORY PANEL:   CBC Recent Labs  Lab 06/13/18  2244  WBC 6.4  HGB 10.7*  HCT 31.5*  PLT 146*   ------------------------------------------------------------------------------------------------------------------  Chemistries  Recent Labs  Lab 06/13/18 2244  NA 142  K 3.8  CL 109  CO2 24  GLUCOSE 111*  BUN 11  CREATININE 0.77  CALCIUM 9.0   ------------------------------------------------------------------------------------------------------------------  Cardiac Enzymes Recent Labs  Lab 06/13/18 2244  TROPONINI 0.06*   ------------------------------------------------------------------------------------------------------------------  RADIOLOGY:  Dg Chest 1 View  Result Date: 06/13/2018 CLINICAL DATA:  84 year old female with shortness of breath and left lower extremity edema. EXAM: CHEST  1 VIEW COMPARISON:  Chest radiograph dated 05/15/2018 FINDINGS: There is shallow inspiration. There are bilateral small pleural effusions with associated compressive atelectasis of the lung bases versus pneumonia. Clinical correlation is recommended. Bilateral perihilar and upper lobe streaky densities  and vascular prominence most likely represent vascular congestion. There is no pneumothorax. Stable cardiac silhouette. No acute osseous pathology. Scoliosis. IMPRESSION: Vascular congestion with small bilateral pleural effusions and bibasilar atelectasis or pneumonia. These findings are new compared to prior radiograph. Clinical correlation is recommended. Electronically Signed   By: Elgie Collard M.D.   On: 06/13/2018 23:05   US Venous Img Lower Unilateral Left  Result Date: 06/14/2018 CLINICAL DATA:  84 year old female with LEFT leg swelling for 2 days. Recent LEFT hip surgery. EXAM: LEFT LOWER EXTREMITY VENOUS DOPPLER ULTRASOUND TECHNIQUE: Gray-scale sonography with graded compression, as well as color Doppler and duplex ultrasound were performed to evaluate the lower extremity deep venous systems from the level of the common femoral vein and including the common femoral, femoral, profunda femoral, popliteal and calf veins including the posterior tibial, peroneal and gastrocnemius veins when visible. The superficial great saphenous vein was also interrogated. Spectral Doppler was utilized to evaluate flow at rest and with distal augmentation maneuvers in the common femoral, femoral and popliteal veins. COMPARISON:  None. FINDINGS: Normal flow, compressibility, and augmentation within the distal common femoral, proximal profunda femoral, proximal greater saphenous, entire femoral, popliteal veins, and imaged calf veins. Subcutaneous edema noted. IMPRESSION: No evidence of LEFT LOWER extremity deep venous thrombosis. Electronically Signed   By: Harmon Pier M.D.   On: 06/14/2018 00:16    EKG:   Orders placed or performed during the hospital encounter of 06/13/18  . EKG 12-Lead  . EKG 12-Lead    IMPRESSION AND PLAN:  Principal Problem:   Acute systolic CHF (congestive heart failure) (HCC) -IV Lasix given, we will trend her cardiac enzymes, get an echocardiogram and a cardiology consult Active  Problems:   PAD (peripheral artery disease) (HCC) -continue home meds, including anticoagulation   HTN (hypertension) -home dose antihypertensives  Chart review performed and case discussed with ED provider. Labs, imaging and/or ECG reviewed by provider and discussed with patient/family. Management plans discussed with the patient and/or family.  DVT PROPHYLAXIS: Systemic anticoagulation  GI PROPHYLAXIS: None  ADMISSION STATUS: Inpatient  CODE STATUS: Full Code Status History    Date Active Date Inactive Code Status Order ID Comments User Context   05/15/2018 1749 05/18/2018 1917 Full Code [id number]  [place] Siren, MD Inpatient   03/24/2017 1454 03/25/2017 1638 Full Code [id number]  Iran Ouch, MD Inpatient   02/24/2017 1234 02/24/2017 1936 Full Code [id number]  Iran Ouch, MD Inpatient   06/28/2016 2128 06/30/2016 2026 DNR [id number]  Houston Siren, MD Inpatient      TOTAL TIME TAKING CARE OF THIS PATIENT: 45 minutes.   Dhyana Bastone FIELDING 06/14/2018, 12:55 AM  [employer]  [phone]  CC: Primary care physician; Guy Begin, MD  Note:  This document was prepared using Dragon voice recognition software and may include unintentional dictation errors.

## 2018-06-15 DIAGNOSIS — I255 Ischemic cardiomyopathy: Secondary | ICD-10-CM

## 2018-06-15 DIAGNOSIS — I5021 Acute systolic (congestive) heart failure: Secondary | ICD-10-CM

## 2018-06-15 DIAGNOSIS — R0602 Shortness of breath: Secondary | ICD-10-CM

## 2018-06-15 DIAGNOSIS — I739 Peripheral vascular disease, unspecified: Secondary | ICD-10-CM

## 2018-06-15 LAB — BASIC METABOLIC PANEL
ANION GAP: 8 (ref 5–15)
BUN: 10 mg/dL (ref 8–23)
CHLORIDE: 108 mmol/L (ref 98–111)
CO2: 25 mmol/L (ref 22–32)
Calcium: 8.4 mg/dL — ABNORMAL LOW (ref 8.9–10.3)
Creatinine, Ser: 0.72 mg/dL (ref 0.44–1.00)
Glucose, Bld: 86 mg/dL (ref 70–99)
Potassium: 3.4 mmol/L — ABNORMAL LOW (ref 3.5–5.1)
SODIUM: 141 mmol/L (ref 135–145)

## 2018-06-15 MED ORDER — NITROGLYCERIN 0.4 MG SL SUBL: 0.4000 mg | SUBLINGUAL_TABLET | SUBLINGUAL | Status: DC | PRN

## 2018-06-15 MED ORDER — POTASSIUM CHLORIDE CRYS ER 20 MEQ PO TBCR
40.0000 meq | EXTENDED_RELEASE_TABLET | Freq: Once | ORAL | Status: AC
  Administered 2018-06-15: 40 meq via ORAL
  Filled 2018-06-15: qty 2

## 2018-06-15 NOTE — Evaluation (Signed)
Physical Therapy Evaluation Patient Details Name: Jordan BandaCecil J Zaugg MRN: [id number] DOB: 1931-03-14 Today's Date: 06/15/2018   History of Present Illness  Pt admitted to hospital on 06/13/18 with progressive SOB. pt subsequently diagnosed with acute systolic CHF. Pt has a past medical history that includes HTN, PAD, and dizziness. Pt had a L hip nailing performed 05/17/18 secondary to a fracture from a fall. Pt was in SNF from d/c after hip surgery until 06/10/18.    Clinical Impression  Pt is a pleasant 84 year old female who was admitted for acute congestive heart failure. Pt performs bed mobility, transfers, and ambulation with min assist. Pt demonstrates deficits with strength, ROM, mobility and endurance. Pts L hip presents continued deficits which limit pt at this point. Pt is extremely limited at this time due to fatigue and decreased endurance pt gets SOB quickly. Pt demonstrates decreased strength especially of L hip. Pts HR remains in the 90s even with activity. Pt could benefit from continued skilled therapy at this time to improve deficits toward PLOF. PT will continue to work with pt at least 2x/week while admitted. D/c recommendations at this time are SNF to improve endurance and safety. Pt could possibly benefit from an ALF long term to help manage her medications and other medical conditions. Pt demonstrates deficits associated to previous hip fx and subsequent surgery however is also drastically limited due to gross deconditioning and fatigue associated with current acute CHF.      Follow Up Recommendations SNF(possibly ALF)    Equipment Recommendations  None recommended by PT    Recommendations for Other Services       Precautions / Restrictions Precautions Precautions: None Restrictions Weight Bearing Restrictions: No      Mobility  Bed Mobility Overal bed mobility: Needs Assistance Bed Mobility: Supine to Sit     Supine to sit: Min assist     General bed mobility  comments: Pt requires min physical assistance for L LE to supine>sit  Transfers Overall transfer level: Needs assistance Equipment used: Rolling walker (2 wheeled) Transfers: Sit to/from Stand Sit to Stand: Min assist         General transfer comment: Pt requires min assist sit<>stand however uses RW safely and appropriately and demonstrates good safety awareness through hand placement  Ambulation/Gait Ambulation/Gait assistance: Min assist;Min guard Gait Distance (Feet): 3 Feet Assistive device: Rolling walker (2 wheeled) Gait Pattern/deviations: Shuffle;Trunk flexed     General Gait Details: pt amb 3' from bed to chair beside bed with CGA to min assist using RW. Demonstrates safe use of RW and good safety awareness during gait. Pt fatigued very easily during gait  Stairs            Wheelchair Mobility    Modified Rankin (Stroke Patients Only)       Balance Overall balance assessment: Needs assistance   Sitting balance-Leahy Scale: Good Sitting balance - Comments: pt displays good sitting balance sitting EOB with feet flat on floor and no UE support     Standing balance-Leahy Scale: Fair Standing balance comment: Pt requires B UE support and CGA in standing to maintain balance                             Pertinent Vitals/Pain Pain Assessment: No/denies pain    Home Living Family/patient expects to be discharged to:: Private residence Living Arrangements: Children(son and daughter) Available Help at Discharge: Family Type of Home: House Home Access:  Stairs to enter Entrance Stairs-Rails: (unsteady railing which pt states is not safe to use) Entrance Stairs-Number of Steps: 3 Home Layout: One level Home Equipment: Walker - 2 wheels;Cane - single point      Prior Function Level of Independence: Needs assistance   Gait / Transfers Assistance Needed: Pt states that she has required RW since hip fracture for gait. Pt states that since d/c home  from rehab she has been sendentary and not been required to perform much mobility. States that family has been assisting with all basic house hold tasks     Comments: Pt has previous history of falls     Hand Dominance        Extremity/Trunk Assessment   Upper Extremity Assessment Upper Extremity Assessment: Overall WFL for tasks assessed    Lower Extremity Assessment Lower Extremity Assessment: LLE deficits/detail;RLE deficits/detail RLE Deficits / Details: Grossly at least 4/5 RLE Sensation: WNL LLE Deficits / Details: pt unable to actively hip flex against gravity, hip abduction with gravity eliminated difficult. Knee flexion and extension 4-/5 LLE Sensation: WNL       Communication   Communication: No difficulties  Cognition Arousal/Alertness: Awake/alert Behavior During Therapy: WFL for tasks assessed/performed Overall Cognitive Status: Within Functional Limits for tasks assessed                                        General Comments      Exercises Other Exercises Other Exercises: pt instructed in B LE SLR and hip abd x10 with min to mod assist as well as LAQ and resisted knee flexion/extension x10 with verbal cuing. Pt fatigues easily during ther-ex,   Assessment/Plan    PT Assessment Patient needs continued PT services  PT Problem List Decreased strength;Decreased range of motion;Decreased activity tolerance;Decreased balance;Decreased mobility;Decreased coordination;Decreased knowledge of use of DME;Decreased cognition;Decreased safety awareness       PT Treatment Interventions DME instruction;Gait training;Stair training;Therapeutic activities;Functional mobility training;Balance training;Therapeutic exercise    PT Goals (Current goals can be found in the Care Plan section)  Acute Rehab PT Goals Patient Stated Goal: pt states that she wants to be active PT Goal Formulation: With patient Time For Goal Achievement: 06/29/18 Potential to  Achieve Goals: Fair    Frequency Min 2X/week   Barriers to discharge        Co-evaluation               AM-PAC PT "6 Clicks" Daily Activity  Outcome Measure Difficulty turning over in bed (including adjusting bedclothes, sheets and blankets)?: A Little Difficulty moving from lying on back to sitting on the side of the bed? : Unable Difficulty sitting down on and standing up from a chair with arms (e.g., wheelchair, bedside commode, etc,.)?: Unable Help needed moving to and from a bed to chair (including a wheelchair)?: A Little Help needed walking in hospital room?: A Lot Help needed climbing 3-5 steps with a railing? : A Lot 6 Click Score: 12    End of Session Equipment Utilized During Treatment: Gait belt Activity Tolerance: Patient tolerated treatment well;Patient limited by fatigue Patient left: in chair;with call bell/phone within reach;with chair alarm set Nurse Communication: Mobility status PT Visit Diagnosis: Unsteadiness on feet (R26.81);Other abnormalities of gait and mobility (R26.89);Repeated falls (R29.6);Muscle weakness (generalized) (M62.81);History of falling (Z91.81);Difficulty in walking, not elsewhere classified (R26.2)    Time: 5409-8119 PT Time Calculation (min) (ACUTE  ONLY): 31 min   Charges:         PT G Codes:        Marjean Imperato, SPT   Shakemia Madera 06/15/2018, 5:16 PM

## 2018-06-15 NOTE — Progress Notes (Addendum)
[facility] - [place] at [facility]   [name]    MR#:  [id number]  DATE OF BIRTH:  [birth year]  SUBJECTIVE:   Shortness of breath improved since admission.  Echo showing ejection fraction of 30 to 35% which is new.  Patient denies any chest pain or any other complaints presently.  REVIEW OF SYSTEMS:    Review of Systems  Constitutional: Negative for chills and fever.  HENT: Negative for congestion and tinnitus.   Eyes: Negative for blurred vision and double vision.  Respiratory: Positive for shortness of breath. Negative for cough and wheezing.   Cardiovascular: Positive for leg swelling. Negative for chest pain, orthopnea and PND.  Gastrointestinal: Negative for abdominal pain, diarrhea, nausea and vomiting.  Genitourinary: Negative for dysuria and hematuria.  Neurological: Negative for dizziness, sensory change and focal weakness.  All other systems reviewed and are negative.   Nutrition: Heart Healthy/Carb modified Tolerating Diet: Yes Tolerating PT: Await eval.   DRUG ALLERGIES:   Allergies  Allergen Reactions  . Sulfa Antibiotics Shortness Of Breath and Other (See Comments)    dizziness    VITALS:  Blood pressure 138/66, pulse 82, temperature 98.2 F (36.8 C), temperature source Oral, resp. rate 18, height 5\' 6"  (1.676 m), weight 76.2 kg (168 lb), SpO2 98 %.  PHYSICAL EXAMINATION:   Physical Exam  GENERAL:  84 y.o.-year-old patient lying in bed in no acute distress.  EYES: Pupils equal, round, reactive to light and accommodation. No scleral icterus. Extraocular muscles intact.  HEENT: Head atraumatic, normocephalic. Oropharynx and nasopharynx clear.  NECK:  Supple, +  jugular venous distention. No thyroid enlargement, no tenderness.  LUNGS: Normal breath sounds bilaterally, no wheezing, basilar rales b/l, No rhonchi. No use of accessory muscles of respiration.  CARDIOVASCULAR: S1, S2 normal. No murmurs, rubs, or gallops.   ABDOMEN: Soft, nontender, nondistended. Bowel sounds present. No organomegaly or mass.  EXTREMITIES: No cyanosis, clubbing, + 1 edema L>R.  NEUROLOGIC: Cranial nerves II through XII are intact. No focal Motor or sensory deficits b/l.  Globally weak.  PSYCHIATRIC: The patient is alert and oriented x 3.  SKIN: No obvious rash, lesion, or ulcer.     LABORATORY PANEL:   CBC Recent Labs  Lab 06/14/18 0930  WBC 6.2  HGB 10.0*  HCT 29.8*  PLT 118*   ------------------------------------------------------------------------------------------------------------------  Chemistries  Recent Labs  Lab 06/15/18 0455  NA 141  K 3.4*  CL 108  CO2 25  GLUCOSE 86  BUN 10  CREATININE 0.72  CALCIUM 8.4*   ------------------------------------------------------------------------------------------------------------------  Cardiac Enzymes Recent Labs  Lab 06/14/18 1502  TROPONINI 0.06*   ------------------------------------------------------------------------------------------------------------------  RADIOLOGY:  Dg Chest 1 View  Result Date: 06/13/2018 CLINICAL DATA:  84 year old female with shortness of breath and left lower extremity edema. EXAM: CHEST  1 VIEW COMPARISON:  Chest radiograph dated 05/15/2018 FINDINGS: There is shallow inspiration. There are bilateral small pleural effusions with associated compressive atelectasis of the lung bases versus pneumonia. Clinical correlation is recommended. Bilateral perihilar and upper lobe streaky densities and vascular prominence most likely represent vascular congestion. There is no pneumothorax. Stable cardiac silhouette. No acute osseous pathology. Scoliosis. IMPRESSION: Vascular congestion with small bilateral pleural effusions and bibasilar atelectasis or pneumonia. These findings are new compared to prior radiograph. Clinical correlation is recommended. Electronically Signed   By: Elgie CollardArash  Radparvar M.D.   On: 06/13/2018 23:05   [place] Venous Img  Lower Unilateral Left  Result Date: 06/14/2018 CLINICAL DATA:  84 year old female  with LEFT leg swelling for 2 days. Recent LEFT hip surgery. EXAM: LEFT LOWER EXTREMITY VENOUS DOPPLER ULTRASOUND TECHNIQUE: Gray-scale sonography with graded compression, as well as color Doppler and duplex ultrasound were performed to evaluate the lower extremity deep venous systems from the level of the common femoral vein and including the common femoral, femoral, profunda femoral, popliteal and calf veins including the posterior tibial, peroneal and gastrocnemius veins when visible. The superficial great saphenous vein was also interrogated. Spectral Doppler was utilized to evaluate flow at rest and with distal augmentation maneuvers in the common femoral, femoral and popliteal veins. COMPARISON:  None. FINDINGS: Normal flow, compressibility, and augmentation within the distal common femoral, proximal profunda femoral, proximal greater saphenous, entire femoral, popliteal veins, and imaged calf veins. Subcutaneous edema noted. IMPRESSION: No evidence of LEFT LOWER extremity deep venous thrombosis. Electronically Signed   By: Harmon Pier M.D.   On: 06/14/2018 00:16     ASSESSMENT AND PLAN:   84 year old female with past medical history of chronic atrial fibrillation, hypertension, chronic diastolic CHF, osteoarthritis, peripheral artery disease who presented to the hospital due to shortness of breath, lower extremity edema.  1.  CHF-acute on chronic systolic dysfunction.  Patient has had about a 10 to 12 pound weight gain at the skilled nursing facility.  - Improving with IV diuresis, About 3 L (-) since admission.  -  Continue IV Lasix for now.  -Appreciate cardiology input, cont. Coreg, Lasix, lisinopril. -Echo showing LV dysfunction with EF of 30 to 35% with anterior wall hypokinesis likely due to ischemia.   2. Ischemic CM -patient's echocardiogram showing LV dysfunction with EF of 30 to 35% which is new. - It  did show anterior wall hypokinesis suspected due to ischemia.  Cardiology discussed with the patient and patient's daughter extensively about possible cardiac catheterization or functional study.  They do not want to pursue that at this time.  They would like to think about it and get back to [place].  3.  Essential hypertension-continue carvedilol, lisinopril.  4.  Chronic atrial fibrillation-rate controlled.  Continue carvedilol.  Continue Eliquis.  5.  Hyperlipidemia-continue atorvastatin.  6.  Elevated troponin-likely supply demand ischemia from underlying CHF.  Troponins have remained flat. - cont. Further care as per Cards. Echo findings as mentioned above.   7. Hypokalemia - due to diuresis and cont. Potassium supplements.    All the records are reviewed and case discussed with Care Management/Social Worker. Management plans discussed with the patient, family and they are in agreement.  CODE STATUS: Full code  DVT Prophylaxis: Lovenox  TOTAL TIME TAKING CARE OF THIS PATIENT: 30 minutes.   POSSIBLE D/C IN 1-2 DAYS, DEPENDING ON CLINICAL CONDITION.   Houston Siren M.D on 06/15/2018 at 2:02 PM  Between 7am to 6pm - Pager - [phone]  After 6pm go to [email or web address] - Social research officer, government  [employer]  Hospitalists  Office  [phone]  CC: Primary care physician; Guy Begin, MD

## 2018-06-15 NOTE — Progress Notes (Signed)
Progress Note  [name] Date of Encounter: 06/15/2018  Primary Cardiologist: No primary care provider on file.   Subjective   Patient states she feels she is resting well but continues to feel increasingly weak. She also reports decreased swelling in her left lower extremity since her admission. She denies further difficulty breathing, chest pain, n/v, and headache. Patient's daughter reports new onset cough since admission. Patient has not yet started to ambulate.  06/15/18 labs significant for K 3.4, Ca 8.4.  06/14/18 Echo results as below show worsening systolic congestive heart failure with EF 35-40%, LVH, severe LV hypokinesis, LAE, and mild RV enlargement. AV and MV slightly thickened, and MV with mild to moderate stenosis / moderate MR. 7/2 echo also showed trivial pericardial effusion, slight elevation in PA pressure, and L pleural effusion.  03/16/18 Echo with normal systolic function with moderate LVH, mild MR, trivial TR, no valvular stenosis  06/15/18 telemetry significant for sinus rhythm, 1st degree AV block, 70s to high 80s bpm, and PACs/PVCs.  Kidney function continues to be stable with diuresis.  Weight returning to baseline of 162lb (6/2) with a decrease from 175lb (7/2) on admission --> 168 lb (7/3).  Breathing improving with 06/15/18 RR 16 - off [place] at 98% ORA.  BP varies 147/76 (7/2) --> 150/83 (7/2) --> 11/57 (7/3) -->138/66 (7/3).    Inpatient Medications    Scheduled Meds: . apixaban  5 mg Oral BID  . atorvastatin  20 mg Oral q1800  . carvedilol  12.5 mg Oral BID WC  . clopidogrel  75 mg Oral Daily  . furosemide  40 mg Intravenous BID  . lisinopril  5 mg Oral Daily  . potassium chloride  20 mEq Oral BID   Continuous Infusions:  PRN Meds: acetaminophen **OR** acetaminophen, guaiFENesin, HYDROcodone-acetaminophen, ondansetron **OR** ondansetron (ZOFRAN) IV   Vital Signs    Vitals:   06/14/18 1943 06/15/18 0240 06/15/18 0310 06/15/18 0743    BP: 126/64  (!) 111/57 138/66  Pulse: 83  71 82  Resp: 16  17 18   Temp: 98.7 F (37.1 C)  98.6 F (37 C) 98.2 F (36.8 C)  TempSrc: Oral  Oral Oral  SpO2: 98%  98% 98%  Weight:  168 lb (76.2 kg)    Height:        Intake/Output Summary (Last 24 hours) at 06/15/2018 0746 Last data filed at 06/15/2018 0000 Gross per 24 hour  Intake 120 ml  Output 1650 ml  Net -1530 ml   Filed Weights   06/13/18 2233 06/14/18 0215 06/15/18 0240  Weight: 150 lb (68 kg) 175 lb 12.8 oz (79.7 kg) 168 lb (76.2 kg)    Telemetry    NSR, 1st degree AV block, HR 70-80s, PACs/ PVCs - Personally Reviewed  ECG    7/1 EKG with Afib, 79 bpm, possible prior anteroseptal infarct, nonspecific st/t changes - Personally Reviewed  Physical Exam   GEN: No acute distress. Resting comfortably in bed.  Neck: Mild to moderate JVD noted on exam.  Cardiac: Soft heart sounds. RRR, no murmurs, rubs, or gallops.  Respiratory: Crackles heard at b/l bases. Breathing unlabored and regular.  GI: Soft, nontender, non-distended  MS: Improving Left lower and upper extremity 1-2+ pitting edema; No deformity. Neuro:  Nonfocal  Psych: Normal affect   Labs    Chemistry Recent Labs  Lab 06/13/18 2244 06/14/18 0930 06/15/18 0455  NA 142 142 141  K 3.8 3.8 3.4*  CL 109 109 108  CO2 24 23 25   GLUCOSE 111* 90 86  BUN 11 9 10   CREATININE 0.77 0.71 0.72  CALCIUM 9.0 8.6* 8.4*  GFRNONAA >60 >60 >60  GFRAA >60 >60 >60  ANIONGAP 9 10 8      Hematology Recent Labs  Lab 06/13/18 2244 06/14/18 0930  WBC 6.4 6.2  RBC 3.25* 3.12*  HGB 10.7* 10.0*  HCT 31.5* 29.8*  MCV 97.0 95.7  MCH 32.8 32.2  MCHC 33.9 33.6  RDW 17.3* 16.7*  PLT 146* 118*    Cardiac Enzymes Recent Labs  Lab 06/13/18 2244 06/14/18 0340 06/14/18 0930 06/14/18 1502  TROPONINI 0.06* 0.05* 0.05* 0.06*   No results for input(s): TROPIPOC in the last 168 hours.   BNP Recent Labs  Lab 06/13/18 2244  BNP 2,611.0*     DDimer No results for  input(s): DDIMER in the last 168 hours.   Radiology    Dg Chest 1 View  Result Date: 06/13/2018 CLINICAL DATA:  84 year old female with shortness of breath and left lower extremity edema. EXAM: CHEST  1 VIEW COMPARISON:  Chest radiograph dated 05/15/2018 FINDINGS: There is shallow inspiration. There are bilateral small pleural effusions with associated compressive atelectasis of the lung bases versus pneumonia. Clinical correlation is recommended. Bilateral perihilar and upper lobe streaky densities and vascular prominence most likely represent vascular congestion. There is no pneumothorax. Stable cardiac silhouette. No acute osseous pathology. Scoliosis. IMPRESSION: Vascular congestion with small bilateral pleural effusions and bibasilar atelectasis or pneumonia. These findings are new compared to prior radiograph. Clinical correlation is recommended. Electronically Signed   By: Elgie Collard M.D.   On: 06/13/2018 23:05   US Venous Img Lower Unilateral Left  Result Date: 06/14/2018 CLINICAL DATA:  84 year old female with LEFT leg swelling for 2 days. Recent LEFT hip surgery. EXAM: LEFT LOWER EXTREMITY VENOUS DOPPLER ULTRASOUND TECHNIQUE: Gray-scale sonography with graded compression, as well as color Doppler and duplex ultrasound were performed to evaluate the lower extremity deep venous systems from the level of the common femoral vein and including the common femoral, femoral, profunda femoral, popliteal and calf veins including the posterior tibial, peroneal and gastrocnemius veins when visible. The superficial great saphenous vein was also interrogated. Spectral Doppler was utilized to evaluate flow at rest and with distal augmentation maneuvers in the common femoral, femoral and popliteal veins. COMPARISON:  None. FINDINGS: Normal flow, compressibility, and augmentation within the distal common femoral, proximal profunda femoral, proximal greater saphenous, entire femoral, popliteal veins, and  imaged calf veins. Subcutaneous edema noted. IMPRESSION: No evidence of LEFT LOWER extremity deep venous thrombosis. Electronically Signed   By: Harmon Pier M.D.   On: 06/14/2018 00:16    Cardiac Studies   Echo 03/2018: INTERPRETATION NORMAL LEFT VENTRICULAR SYSTOLIC FUNCTION WITH MODERATE LVH NORMAL RIGHT VENTRICULAR SYSTOLIC FUNCTION VALVULAR REGURGITATION: MILD MR, TRIVIAL TR NO VALVULAR STENOSIS NO PRIOR STUDY FOR COMPARISON  Echo 06/14/18 Left ventricle: The cavity size was normal. Wall thickness was   increased in a pattern of moderate LVH. Systolic function was   moderately reduced. The estimated ejection fraction was in the   range of 35% to 40%. There is severe hypokinesis of the   mid-apicalanteroseptal, anterior, inferior, and apical   myocardium. - Aortic valve: Trileaflet; mildly thickened leaflets. - Mitral valve: Calcified annulus. Mildly thickened leaflets . The   findings are consistent with mild to moderate stenosis, though   transvalvular gradient may be accentuated by mitral   regurgitation. There was moderate regurgitation. Mean gradient   (  D): 7 mm Hg. - Left atrium: The atrium was moderately dilated. - Right ventricle: The cavity size was mildly dilated. Wall   thickness was normal. Systolic function was mildly reduced. - Pulmonary arteries: Systolic pressure was moderately increased,   in the range of 45 mm Hg to 50 mm Hg. - Pericardium, extracardiac: A trivial pericardial effusion was   identified posterior to the heart. There was a left pleural   effusion.  Patient Profile     84 y.o. female with PMH significant for chronic diastolic CHF, fall, fractured hip 05/2018 s/p ORIF, HTN, PAD, persistent AFib (dx 03/2018, CHADS2VASc 8) on Eliquis, anemia, and hyperglycemia presented to the ED with weakness, difficulty breathing, and severe LE edema and was found to be volume overloaded and in Afib on exam.   Assessment & Plan    1. Acute systolic  CHF/HFrEF: -SOB improving  -Unable to excluded ischemia vs stress-induced CM -Long discussion with patient and daughter (APPs and MD present) regarding these findings and future evaluation -She remains volume up, continue diuresis with IV lasix 40mg  BID with KCl repletion.  -Weight returning to baseline. Last discharge weight on 6/5 reported as 150lbs appears inaccurate as weight recorded at rehab (6/5) as 162 lb per daughter's report. Current 7/3 weight 168lb. Continue daily weights. -Replete K to 4.0. 7/3 labs show K 3.4. -Echo results as above show worsening HF.  -Labetalol changed to coreg 7/2. Continue to monitor HR, electrolytes, and LE swelling. -Presented patient with treatment options of heart catheterization / stress test / medical management. Risks and benefits were explained to the patient in detail. Patient and her daughter prefer to defer further testing and undergo medical management.  -Recommendation for PT / ambulation.  2. Persistent Afib -Currently NSR and HR 70s-80s.  -Labetalol changed to Coreg as above. -Eliquis 5mg  BID, if she decides for invasive procedure Eliquis will need to be held prior to procedure (if inpatient would place on heparin gtt) -CHADS2VASc at least 8 (CHF, HTN, Age x2, Stroke x2, PVD, female)  3. Elevated tropinin -Minimally elevated likely in setting of supply demand ischemia secondary to volume overload and peaking at 0.06. -Echo results as above. -Eliquis in place of ASA.  -Patient prefers medical management as above vs. Further testing to explore cardiac ischemia.   4. PVD -Plavix stopped given Eliquis and risk of bleeding -This is not a NSTEMI so no indication for antiplatelet therapy  -OP follow up.  5. Recent hip fracture s/p ORIF: -Patient has not yet started to ambulate. Recommend PT / daily ambulation. -Per IM  6. Anemia -Stable -Per IM  7. Hyperglycemia -Recommend A1c    For questions or updates, please contact [facility] Please consult [email or web address] for contact info under Cardiology/STEMI.      Signed, Lennon Alstrom, PA-C  06/15/2018, 7:46 AM     Patient seen and discussed with APP on round with changes made as needed as above. MD to staff consult.   Eula Listen, PA-C 06/15/2018 10:41 AM

## 2018-06-16 DIAGNOSIS — R531 Weakness: Secondary | ICD-10-CM

## 2018-06-16 LAB — BASIC METABOLIC PANEL
Anion gap: 6 (ref 5–15)
BUN: 10 mg/dL (ref 8–23)
CALCIUM: 8.7 mg/dL — AB (ref 8.9–10.3)
CHLORIDE: 107 mmol/L (ref 98–111)
CO2: 29 mmol/L (ref 22–32)
CREATININE: 0.86 mg/dL (ref 0.44–1.00)
GFR calc Af Amer: >60 mL/min (ref >60)
GFR calc non Af Amer: >60 mL/min (ref >60)
GLUCOSE: 97 mg/dL (ref 70–99)
Potassium: 3.8 mmol/L (ref 3.5–5.1)
Sodium: 142 mmol/L (ref 135–145)

## 2018-06-16 LAB — MAGNESIUM: Magnesium: 1.7 mg/dL (ref 1.7–2.4)

## 2018-06-16 MED ORDER — MAGNESIUM SULFATE 2 GM/50ML IV SOLN
2.0000 g | INTRAVENOUS | Status: AC
  Administered 2018-06-16: 2 g via INTRAVENOUS
  Filled 2018-06-16: qty 50

## 2018-06-16 MED ORDER — LISINOPRIL 5 MG PO TABS
5.0000 mg | ORAL_TABLET | Freq: Two times a day (BID) | ORAL | Status: DC
  Administered 2018-06-16: 5 mg via ORAL
  Filled 2018-06-16: qty 1

## 2018-06-16 NOTE — Plan of Care (Signed)
  Problem: Health Behavior/Discharge Planning: Goal: Ability to manage health-related needs will improve Outcome: Progressing Note:  According to patient's family member, patient now wants to go to short term rehab instead of prior treatment plan. Informed Child psychotherapistsocial worker. Will continue to monitor PT progress. Jordan FavreSteven M [facility]

## 2018-06-16 NOTE — Clinical Social Work Note (Signed)
Clinical Social Work Assessment  Patient Details  Name: [name] MRN: [id number] Date of Birth: April 11, 1931  Date of referral:  06/16/18               Reason for consult:  Facility Placement                Permission sought to share information with:    Permission granted to share information::     Name::        Agency::     Relationship::     Contact Information:     Housing/Transportation Living arrangements for the past 2 months:  Single Family Home Source of Information:  Patient, Adult Children Patient Interpreter Needed:  None Criminal Activity/Legal Involvement Pertinent to Current Situation/Hospitalization:  No - Comment as needed Significant Relationships:  Adult Children Lives with:  Adult Children Do you feel safe going back to the place where you live?  Yes Need for family participation in patient care:  Yes (Comment)  Care giving concerns:  Patient lives with her daughter [name] and son [name] in [place], [place].    Social Worker assessment / plan:  Holiday representative (CSW) reviewed chart and noted that PT is recommending SNF/ALF. CSW met with patient and her daughter [name] [phone] and son [name] were at bedside. Patient was alert and oriented X3 and was laying in the bed. CSW introduced self and explained role of CSW department. Per daughter patient was in short term rehab at [facility] recently and discharged home last Friday. CSW explained that PT is recommending placement again. Per daughter and patient they do not want placement and want patient to come home. Per daughter patient has 24/7 care at home. Per daughter patient was suppose to start [facility] home health next week and they would like to resume that. RN case Freight forwarder and MD aware of above. CSW will continue to follow and assist as needed.   Employment status:  Retired Nurse, adult PT Recommendations:  [place] / Referral to  community resources:  Other (Comment Required)(Patient and family refused SNF and want home health. )  Patient/Family's Response to care:  Patient and daughter declined SNF and prefer home health.   Patient/Family's Understanding of and Emotional Response to Diagnosis, Current Treatment, and Prognosis:  Patient and daughter were very pleasant and thanked CSW for visit.   Emotional Assessment Appearance:  Appears stated age Attitude/Demeanor/Rapport:    Affect (typically observed):  Accepting, Adaptable, Pleasant Orientation:  Oriented to Self, Oriented to Place, Oriented to  Time, Oriented to Situation Alcohol / Substance use:  Not Applicable Psych involvement (Current and /or in the community):  No (Comment)  Discharge Needs  Concerns to be addressed:  Discharge Planning Concerns Readmission within the last 30 days:  No Current discharge risk:  Dependent with Mobility Barriers to Discharge:  Continued Medical Work up   [employer], [name], LCSW 06/16/2018, 12:39 PM

## 2018-06-16 NOTE — Clinical Social Work Placement (Signed)
   CLINICAL SOCIAL WORK PLACEMENT  NOTE  Date:  06/16/2018  Patient Details  Name: [name] MRN: [id number] Date of Birth: Feb 13, 1931  Clinical Social Work is seeking post-discharge placement for this patient at the Skilled  Nursing Facility level of care (*CSW will initial, date and re-position this form in  chart as items are completed):  Yes   Patient/family provided with [facility] Clinical Social Work Department's list of facilities offering this level of care within the geographic area requested by the patient (or if unable, by the patient's family).  Yes   Patient/family informed of their freedom to choose among providers that offer the needed level of care, that participate in Medicare, Medicaid or managed care program needed by the patient, have an available bed and are willing to accept the patient.  Yes   Patient/family informed of [facility]'s ownership interest in [facility] and [facility], as well as of the fact that they are under no obligation to receive care at these facilities.  PASRR submitted to EDS on       PASRR number received on       Existing PASRR number confirmed on 06/16/18     FL2 transmitted to all facilities in geographic area requested by pt/family on 06/16/18     FL2 transmitted to all facilities within larger geographic area on       Patient informed that his/her managed care company has contracts with or will negotiate with certain facilities, including the following:            Patient/family informed of bed offers received.  Patient chooses bed at       Physician recommends and patient chooses bed at      Patient to be transferred to   on  .  Patient to be transferred to facility by       Patient family notified on   of transfer.  Name of family member notified:        PHYSICIAN       Additional Comment:    _______________________________________________ Rex Magee, [name], LCSW 06/16/2018, 2:26 PM

## 2018-06-16 NOTE — Progress Notes (Signed)
Clinical Child psychotherapistocial Worker (CSW) presented bed offers to patient and daughter. [facility] has not reviewed referral yet. Per daughter If [facility] can't take patient they will accept [facility]. CSW will follow up with [facility] tomorrow.   [employer]iley Jurnei Latini, LCSW [phone]

## 2018-06-16 NOTE — Progress Notes (Signed)
Progress Note  [name] Date of Encounter: 06/16/2018  Primary Cardiologist: No primary care provider on file.   Subjective   Reports that she feels reasonably well as morning Denies working with physical therapy or getting out of bed Walker in the room and note from PT in the computer from yesterday Denies significant edema,  Still with mild low-grade cough otherwise no complaints  06/14/18 Echo EF 35-40%, LVH, severe LV hypokinesis, LAE, and mild RV enlargement. AV and MV slightly thickened, and MV with mild to moderate stenosis / moderate MR.  03/16/18 Echo with normal systolic function with moderate LVH, mild MR, trivial TR, no valvular stenosis   Inpatient Medications    Scheduled Meds: . apixaban  5 mg Oral BID  . atorvastatin  20 mg Oral q1800  . carvedilol  12.5 mg Oral BID WC  . furosemide  40 mg Intravenous BID  . lisinopril  5 mg Oral Daily  . potassium chloride  20 mEq Oral BID   Continuous Infusions:  PRN Meds: acetaminophen **OR** acetaminophen, guaiFENesin, HYDROcodone-acetaminophen, ondansetron **OR** ondansetron (ZOFRAN) IV   Vital Signs    Vitals:   06/15/18 0743 06/15/18 1941 06/16/18 0406 06/16/18 0751  BP: 138/66 135/67 127/61 122/65  Pulse: 82 84 84 71  Resp: 18   18  Temp: 98.2 F (36.8 C) 98.6 F (37 C) 98.2 F (36.8 C) 97.7 F (36.5 C)  TempSrc: Oral Oral Oral Oral  SpO2: 98% 100% 96% 99%  Weight:   164 lb 14.4 oz (74.8 kg)   Height:        Intake/Output Summary (Last 24 hours) at 06/16/2018 1326 Last data filed at 06/16/2018 1218 Gross per 24 hour  Intake 360 ml  Output 1100 ml  Net -740 ml   Filed Weights   06/14/18 0215 06/15/18 0240 06/16/18 0406  Weight: 175 lb 12.8 oz (79.7 kg) 168 lb (76.2 kg) 164 lb 14.4 oz (74.8 kg)    Telemetry    NSR- Personally Reviewed  ECG    7/1 EKG with Afib, 79 bpm, possible prior anteroseptal infarct, nonspecific st/t changes - Personally Reviewed  Physical Exam    Constitutional:  oriented to person, place, and time. No distress.  HENT:  Head: Normocephalic and atraumatic.  Eyes:  no discharge. No scleral icterus.  Neck: Normal range of motion. Neck supple. No JVD present.  Cardiovascular: Normal rate, regular rhythm, normal heart sounds and intact distal pulses. Exam reveals no gallop and no friction rub. Trace edema left lower extremity No murmur heard. Pulmonary/Chest: poor inspiratory effort, otherwise clear  No respiratory distress.  no wheezes.  no rales.  Abdominal: Soft.  no distension.  no tenderness.  Musculoskeletal: Normal range of motion.  no  tenderness or deformity.  Neurological:  normal muscle tone. Coordination normal. No atrophy Skin: Skin is warm and dry. No rash noted. not diaphoretic.  Psychiatric:  normal mood and affect. behavior is normal. Thought content normal.      Labs    Chemistry Recent Labs  Lab 06/14/18 0930 06/15/18 0455 06/16/18 0303  NA 142 141 142  K 3.8 3.4* 3.8  CL 109 108 107  CO2 23 25 29   GLUCOSE 90 86 97  BUN 9 10 10   CREATININE 0.71 0.72 0.86  CALCIUM 8.6* 8.4* 8.7*  GFRNONAA >60 >60 >60  GFRAA >60 >60 >60  ANIONGAP 10 8 6      Hematology Recent Labs  Lab 06/13/18 2244 06/14/18 0930  WBC  6.4 6.2  RBC 3.25* 3.12*  HGB 10.7* 10.0*  HCT 31.5* 29.8*  MCV 97.0 95.7  MCH 32.8 32.2  MCHC 33.9 33.6  RDW 17.3* 16.7*  PLT 146* 118*    Cardiac Enzymes Recent Labs  Lab 06/13/18 2244 06/14/18 0340 06/14/18 0930 06/14/18 1502  TROPONINI 0.06* 0.05* 0.05* 0.06*   No results for input(s): TROPIPOC in the last 168 hours.   BNP Recent Labs  Lab 06/13/18 2244  BNP 2,611.0*     DDimer No results for input(s): DDIMER in the last 168 hours.   Radiology    No results found.  Cardiac Studies   Echo 03/2018: INTERPRETATION NORMAL LEFT VENTRICULAR SYSTOLIC FUNCTION WITH MODERATE LVH NORMAL RIGHT VENTRICULAR SYSTOLIC FUNCTION VALVULAR REGURGITATION: MILD MR, TRIVIAL  TR NO VALVULAR STENOSIS NO PRIOR STUDY FOR COMPARISON  Echo 06/14/18 Left ventricle: The cavity size was normal. Wall thickness was   increased in a pattern of moderate LVH. Systolic function was   moderately reduced. The estimated ejection fraction was in the   range of 35% to 40%. There is severe hypokinesis of the   mid-apicalanteroseptal, anterior, inferior, and apical   myocardium.   Patient Profile     84 y.o. female with PMH significant for chronic diastolic CHF, fall, fractured hip 05/2018 s/p ORIF, HTN, PAD, persistent AFib (dx 03/2018, CHADS2VASc 8) on Eliquis, anemia, and hyperglycemia presented to the ED with weakness, difficulty breathing, and severe LE edema and was found to be volume overloaded and in Afib on exam.   Assessment & Plan   [name]: Acute systolic CHF ejection fraction 35% on echocardiogram, regions of wall motion abnormality noted anterior wall concerning for underlying ischemia -fluid status is difficult to determine Renal function stable, lab work does not suggest prerenal state She has not had significant urine output at least by measurements in the computer in the past 24 hours Would continue IV Lasix twice a day with potassium repletion Continue Coreg, lisinopril, Lasix with potassium ( will increase lisinopril up to 5 twice a day) --yesterday declined ischemic workup including stress test or catheterization  Cardiomyopathy Presumed to be ischemic Predominantly in anterior wall apical region anteroseptal wall.  Also periapical region including inferoapical and lateral apical Patient has declined cardiac catheterization at this time. Long discussion with her concerning risk and benefit -We did offer even perfusion imaging, Myoview. She has declined Would like to see how she feels on medications Plavix on hold as she is on eliquis  Persisted atrial fibrillation Maintaining normal sinus rhythm Eliquis 5 BID Chads vasc 8 Continue carvedilol,  advanced dose as tolerated  Recent hip fracture s/p ORIF: Seen by PT yesterday  Anemia Consider iron panel, guaiac Will need to monitor carefully as she is on eliquis No labs today  Would recommend out of bed to chair/recliner Ambulate again with PT Discharge from cardiac perspective will depend on how she performs with ambulation   Total encounter time more than 25 minutes  Greater than 50% was spent in counseling and coordination of care with the patient   For questions or updates, please contact [facility] Please consult [email or web address] for contact info under Cardiology/STEMI.       Signed, Dossie Arbour, MD, Ph.D [facility]

## 2018-06-16 NOTE — Progress Notes (Signed)
Clinical Child psychotherapistocial Worker (CSW) received a call from the nurse stating that patient's daughter Loa SocksJulene wanted to speak to CSW. CSW contacted daughter and she stated that patient has changed her mind and wants to try to get back into [facility]. Per daughter [facility] CSW made daughter aware that she will have 20% co-pay per day. Daughter reported that they had to pay [facility] $160 per day and are agreeable to do that. FL2 complete and faxed out. CSW left a [facility]icemail for [facility].   Baker Hughes IncorporatedBailey Keshona Kartes, LCSW [phone]

## 2018-06-16 NOTE — [place] FL2 (Signed)
[place] MEDICAID FL2 LEVEL OF CARE SCREENING TOOL     IDENTIFICATION  Patient Name: Jordan BandaCecil J Maddux Birthdate: 12/24/1930 Sex: female Admission Date (Current Location): 06/13/2018  [place]   Facility and Address:  [facility], [address], [place], [place] [address]      Provider Number: [id number]  Attending Physician Name and Address:  Alford HighlandWieting, Richard, MD  Relative Name and Phone Number:       Current Level of Care: Hospital Recommended Level of Care: Skilled Nursing Facility Prior Approval Number:    Date Approved/Denied:   PASRR Number: [id number])  Discharge Plan: SNF    Current Diagnoses: Patient Active Problem List   Diagnosis Date Noted  . Acute systolic CHF (congestive heart failure) (HCC) 06/14/2018  . HTN (hypertension) 06/14/2018  . Closed left hip fracture (HCC) 05/15/2018  . PAD (peripheral artery disease) (HCC)   . Critical lower limb ischemia 03/24/2017  . Sepsis (HCC) 06/28/2016    Orientation RESPIRATION BLADDER Height & Weight     Self, Time, Situation, Place  Normal Continent Weight: 164 lb 14.4 oz (74.8 kg) Height:  5\' 6"  (167.6 cm)  BEHAVIORAL SYMPTOMS/MOOD NEUROLOGICAL BOWEL NUTRITION STATUS      Continent Diet(Diet: Heart Healthy/ Carb Modified. )  AMBULATORY STATUS COMMUNICATION OF NEEDS Skin   Extensive Assist Verbally Surgical wounds(05/16/18 left hip incision. )                       Personal Care Assistance Level of Assistance  Bathing, Feeding, Dressing Bathing Assistance: Limited assistance Feeding assistance: Independent Dressing Assistance: Limited assistance     Functional Limitations Info  Sight, Hearing, Speech Sight Info: Adequate Hearing Info: Adequate Speech Info: Adequate    SPECIAL CARE FACTORS FREQUENCY  PT (By licensed PT), OT (By licensed OT)     PT Frequency: (5) OT Frequency: (5)            Contractures      Additional Factors  Info  Code Status, Allergies Code Status Info: (Full Code. ) Allergies Info: (Sulfa Antibiotics)           Current Medications (06/16/2018):  This is the current hospital active medication list Current Facility-Administered Medications  Medication Dose Route Frequency Provider Last Rate Last Dose  . acetaminophen (TYLENOL) tablet 650 mg  650 mg Oral Q6H PRN Oralia ManisWillis, David, MD   650 mg at 06/16/18 0030   Or  . acetaminophen (TYLENOL) suppository 650 mg  650 mg Rectal Q6H PRN Oralia ManisWillis, David, MD      . apixaban Everlene Balls(ELIQUIS) tablet 5 mg  5 mg Oral BID Oralia ManisWillis, David, MD   5 mg at 06/16/18 0942  . atorvastatin (LIPITOR) tablet 20 mg  20 mg Oral q1800 Oralia ManisWillis, David, MD   20 mg at 06/15/18 1754  . carvedilol (COREG) tablet 12.5 mg  12.5 mg Oral BID WC Eula Listenunn, Ryan M, PA-C   12.5 mg at 06/16/18 0849  . furosemide (LASIX) injection 40 mg  40 mg Intravenous BID Eula ListenDunn, Ryan M, PA-C   40 mg at 06/16/18 0850  . guaiFENesin (ROBITUSSIN) 100 MG/5ML solution 100 mg  5 mL Oral Q4H PRN Houston SirenSainani, Vivek J, MD   100 mg at 06/16/18 0415  . HYDROcodone-acetaminophen (NORCO/VICODIN) 5-325 MG per tablet 1-2 tablet  1-2 tablet Oral Q4H PRN Oralia ManisWillis, David, MD   1 tablet at 06/16/18 0415  . lisinopril (PRINIVIL,ZESTRIL) tablet 5 mg  5 mg Oral Daily Sainani,  Rolly Pancake, MD   5 mg at 06/16/18 0942  . ondansetron (ZOFRAN) tablet 4 mg  4 mg Oral Q6H PRN Oralia Manis, MD       Or  . ondansetron [facility]) injection 4 mg  4 mg Intravenous Q6H PRN Oralia Manis, MD      . potassium chloride SA (K-DUR,KLOR-CON) CR tablet 20 mEq  20 mEq Oral BID Eula Listen M, PA-C   20 mEq at 06/16/18 1610     Discharge Medications: Please see discharge summary for a list of discharge medications.  Relevant Imaging Results:  Relevant Lab Results:   Additional Information (SSN: [id number])  Tobin Witucki, Darleen Crocker, LCSW

## 2018-06-16 NOTE — Progress Notes (Signed)
Patient ID: [name], female   DOB: 11-Jan-1931, 84 y.o.   MRN: [id number]  [employer] PROGRESS NOTE  [name] [id number] DOB: 11-Jan-1931 DOA: 06/13/2018 PCP: [name], Narges, MD  HPI/Subjective: Patient feeling okay. Some cough and shortness of breath.  Objective: Vitals:   06/16/18 0406 06/16/18 0751  BP: 127/61 122/65  Pulse: 84 71  Resp:  18  Temp: 98.2 F (36.8 C) 97.7 F (36.5 C)  SpO2: 96% 99%    Filed Weights   06/14/18 0215 06/15/18 0240 06/16/18 0406  Weight: 79.7 kg (175 lb 12.8 oz) 76.2 kg (168 lb) 74.8 kg (164 lb 14.4 oz)    ROS: Review of Systems  Constitutional: Negative for chills and fever.  Eyes: Negative for blurred vision.  Respiratory: Positive for cough and shortness of breath.   Cardiovascular: Negative for chest pain.  Gastrointestinal: Negative for abdominal pain, constipation, diarrhea, nausea and vomiting.  Genitourinary: Negative for dysuria.  Musculoskeletal: Negative for joint pain.  Neurological: Negative for dizziness and headaches.   Exam: Physical Exam  Constitutional: She is oriented to person, place, and time.  HENT:  Nose: No mucosal edema.  Mouth/Throat: No oropharyngeal exudate or posterior oropharyngeal edema.  Eyes: Pupils are equal, round, and reactive to light. Conjunctivae, EOM and lids are normal.  Neck: No JVD present. Carotid bruit is not present. No edema present. No thyroid mass and no thyromegaly present.  Cardiovascular: S1 normal and S2 normal. Exam reveals no gallop.  No murmur heard. Pulses:      Dorsalis pedis pulses are 2+ on the right side, and 2+ on the left side.  Respiratory: No respiratory distress. She has decreased breath sounds in the right lower field and the left lower field. She has no wheezes. She has no rhonchi. She has no rales.  GI: Soft. Bowel sounds are normal. There is no tenderness.  Musculoskeletal:       Right ankle: She exhibits swelling.       Left ankle: She exhibits  swelling.  Lymphadenopathy:    She has no cervical adenopathy.  Neurological: She is alert and oriented to person, place, and time. No cranial nerve deficit.  Skin: Skin is warm. Nails show no clubbing.  Chronic lower extremity discoloration  Psychiatric: She has a normal mood and affect.      Data Reviewed: Basic Metabolic Panel: Recent Labs  Lab 06/13/18 2244 06/14/18 0930 06/15/18 0455 06/16/18 0303  NA 142 142 141 142  K 3.8 3.8 3.4* 3.8  CL 109 109 108 107  CO2 24 23 25 29   GLUCOSE 111* 90 86 97  BUN 11 9 10 10   CREATININE 0.77 0.71 0.72 0.86  CALCIUM 9.0 8.6* 8.4* 8.7*  MG  --   --   --  1.7   CBC: Recent Labs  Lab 06/13/18 2244 06/14/18 0930  WBC 6.4 6.2  NEUTROABS 4.2  --   HGB 10.7* 10.0*  HCT 31.5* 29.8*  MCV 97.0 95.7  PLT 146* 118*   Cardiac Enzymes: Recent Labs  Lab 06/13/18 2244 06/14/18 0340 06/14/18 0930 06/14/18 1502  TROPONINI 0.06* 0.05* 0.05* 0.06*   BNP (last 3 results) Recent Labs    06/13/18 2244  BNP 2,611.0*     Scheduled Meds: . apixaban  5 mg Oral BID  . atorvastatin  20 mg Oral q1800  . carvedilol  12.5 mg Oral BID WC  . furosemide  40 mg Intravenous BID  . lisinopril  5 mg Oral BID  .  potassium chloride  20 mEq Oral BID    Assessment/Plan:  1. Acute on chronic systolic congestive heart failure.  Patient on Coreg, lisinopril and IV furosemide. 2. Cardiomyopathy.  Family has declined catheterization Myoview at this time.  Will need outpatient follow-up 3. Persistent atrial fibrillation on Eliquis and Coreg. 4. Recent left hip fracture and weakness.  Physical therapy recommended rehab but patient and family want to take her home with home health. 5. Hyperlipidemia unspecified on atorvastatin 6. Elevated troponin demand ischemia from underlying congestive heart failure 7. Thrombocytopenia check hepatitis C  Code Status:     Code Status Orders  (From admission, onward)        Start     Ordered   06/14/18  0244  Full code  Continuous     06/14/18 0243    Code Status History    Date Active Date Inactive Code Status Order ID Comments User Context   05/15/2018 1749 05/18/2018 1917 Full Code [id number]  Houston Siren, MD Inpatient   03/24/2017 1454 03/25/2017 1638 Full Code [id number]  Iran Ouch, MD Inpatient   02/24/2017 1234 02/24/2017 1936 Full Code [id number]  Iran Ouch, MD Inpatient   06/28/2016 2128 06/30/2016 2026 DNR [id number]  Houston Siren, MD Inpatient     Family Communication: Daughter at the bedside Disposition Plan: Home with home health.  Would like physical therapy to evaluate again prior to disposition  Consultants:  Cardiology  Time spent: 28 minutes  Giorgi Debruin [employer]

## 2018-06-17 ENCOUNTER — Inpatient Hospital Stay: Payer: Medicare Other

## 2018-06-17 DIAGNOSIS — I255 Ischemic cardiomyopathy: Secondary | ICD-10-CM

## 2018-06-17 DIAGNOSIS — I10 Essential (primary) hypertension: Secondary | ICD-10-CM

## 2018-06-17 DIAGNOSIS — J40 Bronchitis, not specified as acute or chronic: Secondary | ICD-10-CM

## 2018-06-17 LAB — BASIC METABOLIC PANEL
Anion gap: 11 (ref 5–15)
BUN: 12 mg/dL (ref 8–23)
CALCIUM: 8.5 mg/dL — AB (ref 8.9–10.3)
CHLORIDE: 105 mmol/L (ref 98–111)
CO2: 25 mmol/L (ref 22–32)
CREATININE: 0.9 mg/dL (ref 0.44–1.00)
GFR, EST NON AFRICAN AMERICAN: 57 mL/min — AB (ref >60)
Glucose, Bld: 94 mg/dL (ref 70–99)
Potassium: 3.8 mmol/L (ref 3.5–5.1)
SODIUM: 141 mmol/L (ref 135–145)

## 2018-06-17 LAB — CBC
HCT: 32.9 % — ABNORMAL LOW (ref 35.0–47.0)
Hemoglobin: 11.2 g/dL — ABNORMAL LOW (ref 12.0–16.0)
MCH: 32.4 pg (ref 26.0–34.0)
MCHC: 34 g/dL (ref 32.0–36.0)
MCV: 95.2 fL (ref 80.0–100.0)
PLATELETS: 138 10*3/uL — AB (ref 150–440)
RBC: 3.46 MIL/uL — ABNORMAL LOW (ref 3.80–5.20)
RDW: 16.6 % — ABNORMAL HIGH (ref 11.5–14.5)
WBC: 8.6 10*3/uL (ref 3.6–11.0)

## 2018-06-17 LAB — BRAIN NATRIURETIC PEPTIDE: B NATRIURETIC PEPTIDE 5: 1400 pg/mL — AB (ref 0.0–100.0)

## 2018-06-17 MED ORDER — HYDROCODONE-ACETAMINOPHEN 5-325 MG PO TABS: 1.0000 | ORAL_TABLET | Freq: Four times a day (QID) | ORAL | 0 refills | Status: DC | PRN

## 2018-06-17 MED ORDER — POTASSIUM CHLORIDE CRYS ER 20 MEQ PO TBCR: 20.0000 meq | EXTENDED_RELEASE_TABLET | Freq: Every day | ORAL | 0 refills | Status: DC

## 2018-06-17 MED ORDER — CARVEDILOL 25 MG PO TABS
25.0000 mg | ORAL_TABLET | Freq: Two times a day (BID) | ORAL | 0 refills | Status: DC
Start: 2018-06-17 — End: 2019-12-10

## 2018-06-17 MED ORDER — BUDESONIDE 0.5 MG/2ML IN SUSP: 0.5000 mg | Freq: Two times a day (BID) | RESPIRATORY_TRACT | 0 refills | Status: DC

## 2018-06-17 MED ORDER — FUROSEMIDE 40 MG PO TABS
40.0000 mg | ORAL_TABLET | Freq: Two times a day (BID) | ORAL | Status: DC
  Administered 2018-06-17 – 2018-06-18 (×2): 40 mg via ORAL
  Filled 2018-06-17 (×2): qty 1

## 2018-06-17 MED ORDER — SPIRONOLACTONE 25 MG PO TABS: 12.5000 mg | ORAL_TABLET | Freq: Every day | ORAL | 0 refills | Status: DC

## 2018-06-17 MED ORDER — POTASSIUM CHLORIDE CRYS ER 20 MEQ PO TBCR
20.0000 meq | EXTENDED_RELEASE_TABLET | Freq: Every day | ORAL | Status: DC
  Administered 2018-06-17 – 2018-06-18 (×2): 20 meq via ORAL
  Filled 2018-06-17 (×2): qty 1

## 2018-06-17 MED ORDER — IPRATROPIUM-ALBUTEROL 0.5-2.5 (3) MG/3ML IN SOLN
3.0000 mL | Freq: Four times a day (QID) | RESPIRATORY_TRACT | Status: DC
  Administered 2018-06-17: 3 mL via RESPIRATORY_TRACT
  Filled 2018-06-17: qty 3

## 2018-06-17 MED ORDER — BENZONATATE 100 MG PO CAPS: 200.0000 mg | ORAL_CAPSULE | Freq: Three times a day (TID) | ORAL | Status: DC | PRN

## 2018-06-17 MED ORDER — SPIRONOLACTONE 25 MG PO TABS
12.5000 mg | ORAL_TABLET | Freq: Every day | ORAL | Status: DC
  Administered 2018-06-17 – 2018-06-18 (×2): 12.5 mg via ORAL
  Filled 2018-06-17 (×2): qty 0.5
  Filled 2018-06-17 (×2): qty 1

## 2018-06-17 MED ORDER — GUAIFENESIN 100 MG/5ML PO SOLN: 5.0000 mL | ORAL | 0 refills | Status: DC | PRN

## 2018-06-17 MED ORDER — AZITHROMYCIN 250 MG PO TABS
250.0000 mg | ORAL_TABLET | Freq: Every day | ORAL | Status: DC
  Administered 2018-06-18: 250 mg via ORAL
  Filled 2018-06-17: qty 1

## 2018-06-17 MED ORDER — IPRATROPIUM-ALBUTEROL 0.5-2.5 (3) MG/3ML IN SOLN: 3.0000 mL | Freq: Four times a day (QID) | RESPIRATORY_TRACT | 0 refills | Status: DC

## 2018-06-17 MED ORDER — FUROSEMIDE 40 MG PO TABS: 40.0000 mg | ORAL_TABLET | Freq: Two times a day (BID) | ORAL | 0 refills | Status: DC

## 2018-06-17 MED ORDER — BUDESONIDE 0.5 MG/2ML IN SUSP
0.5000 mg | Freq: Two times a day (BID) | RESPIRATORY_TRACT | Status: DC
  Administered 2018-06-17 – 2018-06-18 (×2): 0.5 mg via RESPIRATORY_TRACT
  Filled 2018-06-17 (×2): qty 2

## 2018-06-17 MED ORDER — LISINOPRIL 10 MG PO TABS
10.0000 mg | ORAL_TABLET | Freq: Every day | ORAL | Status: DC
  Administered 2018-06-17: 10 mg via ORAL
  Filled 2018-06-17: qty 1

## 2018-06-17 MED ORDER — ACETAMINOPHEN 325 MG PO TABS: 650.0000 mg | ORAL_TABLET | Freq: Four times a day (QID) | ORAL | Status: AC | PRN

## 2018-06-17 MED ORDER — LISINOPRIL 10 MG PO TABS: 10.0000 mg | ORAL_TABLET | Freq: Every day | ORAL | 0 refills | Status: DC

## 2018-06-17 MED ORDER — AZITHROMYCIN 250 MG PO TABS: ORAL_TABLET | ORAL | 0 refills | Status: DC

## 2018-06-17 MED ORDER — IPRATROPIUM-ALBUTEROL 0.5-2.5 (3) MG/3ML IN SOLN
3.0000 mL | Freq: Three times a day (TID) | RESPIRATORY_TRACT | Status: DC
Start: 2018-06-17 — End: 2018-06-18
  Administered 2018-06-17 – 2018-06-18 (×2): 3 mL via RESPIRATORY_TRACT
  Filled 2018-06-17 (×3): qty 3

## 2018-06-17 MED ORDER — AZITHROMYCIN 250 MG PO TABS
500.0000 mg | ORAL_TABLET | Freq: Every day | ORAL | Status: AC
  Administered 2018-06-17: 500 mg via ORAL
  Filled 2018-06-17: qty 2

## 2018-06-17 MED ORDER — POTASSIUM CHLORIDE CRYS ER 20 MEQ PO TBCR: 20.0000 meq | EXTENDED_RELEASE_TABLET | Freq: Once | ORAL | Status: DC

## 2018-06-17 MED ORDER — CARVEDILOL 25 MG PO TABS
25.0000 mg | ORAL_TABLET | Freq: Two times a day (BID) | ORAL | Status: DC
  Administered 2018-06-17 – 2018-06-18 (×2): 25 mg via ORAL
  Filled 2018-06-17 (×2): qty 1

## 2018-06-17 NOTE — Discharge Summary (Addendum)
[facility] [place] [facility]   [name]    MR#:  [id number]  DATE OF BIRTH:  04/25/1931  DATE OF ADMISSION:  06/13/2018 ADMITTING PHYSICIAN: Oralia Manis, MD  DATE OF DISCHARGE: 06/18/2018  PRIMARY CARE PHYSICIAN: Guy Begin, MD    ADMISSION DIAGNOSIS:  Shortness of breath [R06.02] Acute congestive heart failure, unspecified heart failure type (HCC) [I50.9]  DISCHARGE DIAGNOSIS:  Principal Problem:   Acute systolic CHF (congestive heart failure) (HCC) Active Problems:   PAD (peripheral artery disease) (HCC)   HTN (hypertension)   Ischemic cardiomyopathy   Bronchitis   SECONDARY DIAGNOSIS:   Past Medical History:  Diagnosis Date  . Arthritis    "maybe a little" (82/10/2017)  . Chronic diastolic CHF (congestive heart failure) (HCC)    a. TTE 4/19: nl LVSF, nl RVSF, mod LVH, mild MR, trivial TR  . Dizziness   . Fall    a. fractured left hip 05/2018 s/p ORIF  . Hypertension   . PAD (peripheral artery disease) (HCC)   . Persistent atrial fibrillation (HCC)    a. diagnosed 03/2018; b. CHADS2VASc 8 (CHF, HTN, age x 2, stroke x 2, vascular disease, female); c. Eliquis    HOSPITAL COURSE:   1.  Acute on chronic systolic congestive heart failure.  The patient's labetalol was switched over to Coreg.  The patient is on lisinopril.  Lisinopril dose was decreased secondary to blood pressure being on the lower side.  With heart failure patients we do like blood pressure being on the lower side as long as she is feeling okay.  The patient was diuresed with IV furosemide.  She will be switched over to Lasix 40 mg twice daily.  Also on low-dose Spironolactone.  Recommend checking a basic metabolic panel in 1 week.  Family declined stress testing or cardiac catheterization at this time.   2.  Bronchitis.  Start nebulizer treatments with budesonide and DuoNeb.  Empiric Zithromax.  Repeat chest x-ray does not show any pneumonia.  After she coughs her  lungs are clear.  When she stops coughing can potentially get rid of the nebulizer treatments may be in a week or so. 3.  Persistent atrial fibrillation.  Patient on Eliquis for anticoagulation.  Coreg for rate control. 4.  Recent left hip fracture and weakness.  Physical therapy recommended rehab. 5.  Hyperlipidemia unspecified on atorvastatin 6.  Elevated troponin.  Demand ischemia from underlying congestive heart failure 7.  Thrombocytopenia. hepatitis C is negative.  DISCHARGE CONDITIONS:   Satisfactory  CONSULTS OBTAINED:  Treatment Team:  Iran Ouch, MD  DRUG ALLERGIES:   Allergies  Allergen Reactions  . Sulfa Antibiotics Shortness Of Breath and Other (See Comments)    dizziness    DISCHARGE MEDICATIONS:   Allergies as of 06/18/2018      Reactions   Sulfa Antibiotics Shortness Of Breath, Other (See Comments)   dizziness      Medication List    STOP taking these medications   amLODipine 5 MG tablet Commonly known as:  NORVASC   clopidogrel 75 MG tablet Commonly known as:  PLAVIX   docusate sodium 100 MG capsule Commonly known as:  COLACE   labetalol 100 MG tablet Commonly known as:  NORMODYNE     TAKE these medications   acetaminophen 325 MG tablet Commonly known as:  TYLENOL Take 2 tablets (650 mg total) by mouth every 6 (six) hours as needed for mild pain or moderate pain (or Fever >/=  101).   atorvastatin 20 MG tablet Commonly known as:  LIPITOR Take 1 tablet (20 mg total) by mouth daily at 6 PM.   azithromycin 250 MG tablet Commonly known as:  ZITHROMAX One tab daily for three days Start taking on:  06/19/2018   budesonide 0.5 MG/2ML nebulizer solution Commonly known as:  PULMICORT Take 2 mLs (0.5 mg total) by nebulization 2 (two) times daily.   carvedilol 25 MG tablet Commonly known as:  COREG Take 1 tablet (25 mg total) by mouth 2 (two) times daily with a meal.   cholecalciferol 1000 units tablet Commonly known as:  VITAMIN D Take  1,000 Units by mouth daily.   ELIQUIS 5 MG Tabs tablet Generic drug:  apixaban Take 5 mg by mouth 2 (two) times daily.   furosemide 40 MG tablet Commonly known as:  LASIX Take 1 tablet (40 mg total) by mouth 2 (two) times daily. What changed:    medication strength  how much to take  when to take this   guaiFENesin 100 MG/5ML Soln Commonly known as:  ROBITUSSIN Take 5 mLs (100 mg total) by mouth every 4 (four) hours as needed for cough or to loosen phlegm.   HYDROcodone-acetaminophen 5-325 MG tablet Commonly known as:  NORCO/VICODIN Take 1 tablet by mouth every 6 (six) hours as needed for severe pain. What changed:    how much to take  when to take this  reasons to take this   ipratropium-albuterol 0.5-2.5 (3) MG/3ML Soln Commonly known as:  DUONEB Take 3 mLs by nebulization every 6 (six) hours.   lisinopril 2.5 MG tablet Commonly known as:  PRINIVIL,ZESTRIL Take 1 tablet (2.5 mg total) by mouth daily.   potassium chloride SA 20 MEQ tablet Commonly known as:  K-DUR,KLOR-CON Take 1 tablet (20 mEq total) by mouth daily.   spironolactone 25 MG tablet Commonly known as:  ALDACTONE Take 0.5 tablets (12.5 mg total) by mouth daily.        DISCHARGE INSTRUCTIONS:    Follow-up Dr. rehab 1 day Follow-up cardiology 1 to 2 weeks Follow-up CHF clinic  If you experience worsening of your admission symptoms, develop shortness of breath, life threatening emergency, suicidal or homicidal thoughts you must seek medical attention immediately by calling 911 or calling your MD immediately  if symptoms less severe.  You Must read complete instructions/literature along with all the possible adverse reactions/side effects for all the Medicines you take and that have been prescribed to you. Take any new Medicines after you have completely understood and accept all the possible adverse reactions/side effects.   Please note  You were cared for by a hospitalist during your  hospital stay. If you have any questions about your discharge medications or the care you received while you were in the hospital after you are discharged, you can call the unit and asked to speak with the hospitalist on call if the hospitalist that took care of you is not available. Once you are discharged, your primary care physician will handle any further medical issues. Please note that NO REFILLS for any discharge medications will be authorized once you are discharged, as it is imperative that you return to your primary care physician (or establish a relationship with a primary care physician if you do not have one) for your aftercare needs so that they can reassess your need for medications and monitor your lab values.    Today   CHIEF COMPLAINT:   Chief Complaint  Patient presents with  .  Shortness of Breath    HISTORY OF PRESENT ILLNESS:  Ladaja Yusupov  is a 84 y.o. female presented with shortness of breath   VITAL SIGNS:  Blood pressure (!) 99/52, pulse (!) 104, temperature 98.3 F (36.8 C), temperature source Oral, resp. rate 18, height 5\' 6"  (1.676 m), weight 72.1 kg (159 lb), SpO2 97 %.    PHYSICAL EXAMINATION:  GENERAL:  84 y.o.-year-old patient lying in the bed with no acute distress.  EYES: Pupils equal, round, reactive to light and accommodation. No scleral icterus. Extraocular muscles intact.  HEENT: Head atraumatic, normocephalic. Oropharynx and nasopharynx clear.  NECK:  Supple, no jugular venous distention. No thyroid enlargement, no tenderness.  LUNGS:  decreased breath sounds bilateral bases, no wheezing.  After coughing lungs sound clear.  No use of accessory muscles of respiration.  CARDIOVASCULAR: S1, S2 normal. No murmurs, rubs, or gallops.  ABDOMEN: Soft, non-tender, non-distended. Bowel sounds present. No organomegaly or mass.  EXTREMITIES: 2+ pedal edema, cyanosis, or clubbing.  NEUROLOGIC: Cranial nerves II through XII are intact. Muscle strength 5/5 in  all extremities. Sensation intact. Gait not checked.  PSYCHIATRIC: The patient is alert and oriented x 3.  SKIN: Chronic lower extremity discoloration  DATA REVIEW:   CBC Recent Labs  Lab 06/17/18 1040  WBC 8.6  HGB 11.2*  HCT 32.9*  PLT 138*    Chemistries  Recent Labs  Lab 06/16/18 0303  06/18/18 0414  NA 142   < > 139  K 3.8   < > 3.5  CL 107   < > 104  CO2 29   < > 27  GLUCOSE 97   < > 91  BUN 10   < > 15  CREATININE 0.86   < > 1.05*  CALCIUM 8.7*   < > 8.5*  MG 1.7  --   --    < > = values in this interval not displayed.    Cardiac Enzymes Recent Labs  Lab 06/14/18 1502  TROPONINI 0.06*    Microbiology Results  Results for orders placed or performed during the hospital encounter of 05/15/18  Surgical PCR screen     Status: None   Collection Time: 05/15/18  6:58 PM  Result Value Ref Range Status   MRSA, PCR NEGATIVE NEGATIVE Final   Staphylococcus aureus NEGATIVE NEGATIVE Final    Comment: (NOTE) The Xpert SA Assay (FDA approved for NASAL specimens in patients 44 years of age and older), is one component of a comprehensive surveillance program. It is not intended to diagnose infection nor to guide or monitor treatment. Performed at [facility], [address], [place], [place] [address]     RADIOLOGY:  Dg Chest 2 View  Result Date: 06/17/2018 CLINICAL DATA:  Productive cough. EXAM: CHEST - 2 VIEW COMPARISON:  Radiograph June 13, 2018. FINDINGS: Stable cardiomegaly. No pneumothorax is noted. Mild central pulmonary vascular congestion may be present. Mild bibasilar atelectasis is noted with small pleural effusions. Bony thorax is unremarkable. IMPRESSION: Stable cardiomegaly with mild central pulmonary vascular congestion. Mild bibasilar subsegmental atelectasis is noted with small pleural effusions. Electronically Signed   By: Lupita Raider, M.D.   On: 06/17/2018 09:40    Management plans discussed with the patient, family and they are in  agreement.  CODE STATUS:     Code Status Orders  (From admission, onward)        Start     Ordered   06/14/18 0244  Full code  Continuous  06/14/18 0243    Code Status History    Date Active Date Inactive Code Status Order ID Comments User Context   05/15/2018 1749 05/18/2018 1917 Full Code [id number]  Houston Siren, MD Inpatient   03/24/2017 1454 03/25/2017 1638 Full Code [id number]  Iran Ouch, MD Inpatient   02/24/2017 1234 02/24/2017 1936 Full Code [id number]  Iran Ouch, MD Inpatient   06/28/2016 2128 06/30/2016 2026 DNR [id number]  Houston Siren, MD Inpatient      TOTAL TIME TAKING CARE OF THIS PATIENT: 35 minutes.    Alford Highland M.D on 06/18/2018 at 9:15 AM  Between 7am to 6pm - Pager - [phone]  After 6pm go to [email or web address] - password [email or web address]  [facility] Office  [phone]  CC: Primary care physician; Guy Begin, MD

## 2018-06-17 NOTE — Care Management Important Message (Signed)
Copy of signed IM left with patient in room.  

## 2018-06-17 NOTE — Progress Notes (Signed)
Patient ID: RESHUNDA STRIDER, female   DOB: March 21, 1931, 84 y.o.   MRN: [id number]  [facility] PROGRESS NOTE  SAORY CARRIERO BJY:[id number] DOB: 28-Dec-1930 DOA: 06/13/2018 PCP: Guy Begin, MD  HPI/Subjective: Patient feeling okay.  Still with some shortness of breath with moving around and some cough.  Objective: Vitals:   06/16/18 1934 06/17/18 0336  BP: 131/63 (!) 151/86  Pulse: 94 100  Resp: 18 19  Temp: 98.8 F (37.1 C) 98.4 F (36.9 C)  SpO2: 98% 97%    Filed Weights   06/15/18 0240 06/16/18 0406 06/17/18 0336  Weight: 76.2 kg (168 lb) 74.8 kg (164 lb 14.4 oz) 74.3 kg (163 lb 12.8 oz)    ROS: Review of Systems  Constitutional: Negative for chills and fever.  Eyes: Negative for blurred vision.  Respiratory: Positive for cough and shortness of breath.   Cardiovascular: Negative for chest pain.  Gastrointestinal: Negative for abdominal pain, constipation, diarrhea, nausea and vomiting.  Genitourinary: Negative for dysuria.  Musculoskeletal: Negative for joint pain.  Neurological: Negative for dizziness and headaches.   Exam: Physical Exam  Constitutional: She is oriented to person, place, and time.  HENT:  Nose: No mucosal edema.  Mouth/Throat: No oropharyngeal exudate or posterior oropharyngeal edema.  Eyes: Pupils are equal, round, and reactive to light. Conjunctivae, EOM and lids are normal.  Neck: No JVD present. Carotid bruit is not present. No edema present. No thyroid mass and no thyromegaly present.  Cardiovascular: S1 normal and S2 normal. Exam reveals no gallop.  No murmur heard. Pulses:      Dorsalis pedis pulses are 2+ on the right side, and 2+ on the left side.  Respiratory: No respiratory distress. She has decreased breath sounds in the right lower field and the left lower field. She has no wheezes. She has rhonchi in the right lower field and the left lower field. She has no rales.  GI: Soft. Bowel sounds are normal. There is no tenderness.   Musculoskeletal:       Right ankle: She exhibits swelling.       Left ankle: She exhibits swelling.  Lymphadenopathy:    She has no cervical adenopathy.  Neurological: She is alert and oriented to person, place, and time. No cranial nerve deficit.  Skin: Skin is warm. Nails show no clubbing.  Chronic lower extremity discoloration  Psychiatric: She has a normal mood and affect.      Data Reviewed: Basic Metabolic Panel: Recent Labs  Lab 06/13/18 2244 06/14/18 0930 06/15/18 0455 06/16/18 0303 06/17/18 0543  NA 142 142 141 142 141  K 3.8 3.8 3.4* 3.8 3.8  CL 109 109 108 107 105  CO2 24 23 25 29 25   GLUCOSE 111* 90 86 97 94  BUN 11 9 10 10 12   CREATININE 0.77 0.71 0.72 0.86 0.90  CALCIUM 9.0 8.6* 8.4* 8.7* 8.5*  MG  --   --   --  1.7  --    CBC: Recent Labs  Lab 06/13/18 2244 06/14/18 0930 06/17/18 1040  WBC 6.4 6.2 8.6  NEUTROABS 4.2  --   --   HGB 10.7* 10.0* 11.2*  HCT 31.5* 29.8* 32.9*  MCV 97.0 95.7 95.2  PLT 146* 118* 138*   Cardiac Enzymes: Recent Labs  Lab 06/13/18 2244 06/14/18 0340 06/14/18 0930 06/14/18 1502  TROPONINI 0.06* 0.05* 0.05* 0.06*   BNP (last 3 results) Recent Labs    06/13/18 2244 06/17/18 1040  BNP 2,611.0* 1,400.0*  Scheduled Meds: . apixaban  5 mg Oral BID  . atorvastatin  20 mg Oral q1800  . [START ON 06/18/2018] azithromycin  250 mg Oral Daily  . budesonide (PULMICORT) nebulizer solution  0.5 mg Nebulization BID  . carvedilol  25 mg Oral BID WC  . furosemide  40 mg Oral BID  . ipratropium-albuterol  3 mL Nebulization Q6H  . lisinopril  10 mg Oral Daily  . potassium chloride  20 mEq Oral Daily  . spironolactone  12.5 mg Oral Daily    Assessment/Plan:  1. Acute on chronic systolic congestive heart failure.  Patient on Coreg, lisinopril and  spironolactone.  As per cardiology can switch to oral Lasix this evening. 2. Cardiomyopathy.  Family has declined catheterization Myoview at this time.  Will need outpatient  follow-up 3. Persistent atrial fibrillation on Eliquis and Coreg. 4. Bronchitis.  Start Zithromax and nebulizer treatments with budesonide and DuoNeb 5. Recent left hip fracture and weakness.  Physical therapy recommended rehab.  Family interested in going to rehab. 6. Hyperlipidemia unspecified on atorvastatin 7. Elevated troponin demand ischemia from underlying congestive heart failure 8. Thrombocytopenia check hepatitis C  Code Status:     Code Status Orders  (From admission, onward)        Start     Ordered   06/14/18 0244  Full code  Continuous     06/14/18 0243    Code Status History    Date Active Date Inactive Code Status Order ID Comments User Context   05/15/2018 1749 05/18/2018 1917 Full Code [id number]  Houston SirenSainani, Vivek J, MD Inpatient   03/24/2017 1454 03/25/2017 1638 Full Code [id number]  Iran OuchArida, Muhammad A, MD Inpatient   02/24/2017 1234 02/24/2017 1936 Full Code [id number]  Iran OuchArida, Muhammad A, MD Inpatient   06/28/2016 2128 06/30/2016 2026 DNR [id number]  Houston SirenSainani, Vivek J, MD Inpatient     Family Communication: Daughter at the bedside Disposition Plan:  potentially out to rehab tomorrow.  Consultants:  Cardiology  Time spent: 35 minutes  Gaelan Glennon [employer]  [facility]

## 2018-06-17 NOTE — Progress Notes (Signed)
Progress Note  [name] Date of Encounter: 06/17/2018  Primary Cardiologist: No primary care provider on file.   Subjective   Patient and daughter both express concern over patient's persistent cough. Patient states that cough transitioned from a dry to productive cough with white phlegm this morning. She also c/o a hoarse voice from coughing. The daughter request cough medicine for her mother.  She denies any headache, nasal congestion, sore throat, chest pain, n/v/d, or abdominal pain. She is sleeping and eating well and otherwise has no complaints.  Patient and daughter report that patient can stand but still has trouble with ambulation. The daughter requests more PT and would like to see if her mother can stand and walk around herself.  Daughter also requests walker adjustment. The daughter also reports that she is to meet with the social worker this morning to discuss if [facility] has a room for her mother.   Per telemetry, patient had a run of Afib but currently in SR.  Weight down to 163 pounds this morning (admission weight 175 pounds). Renal function stable on IV Lasix 40 mg bid.   Inpatient Medications    Scheduled Meds: . apixaban  5 mg Oral BID  . atorvastatin  20 mg Oral q1800  . carvedilol  12.5 mg Oral BID WC  . furosemide  40 mg Intravenous BID  . lisinopril  5 mg Oral BID  . potassium chloride  20 mEq Oral BID   Continuous Infusions:  PRN Meds: acetaminophen **OR** acetaminophen, benzonatate, guaiFENesin, HYDROcodone-acetaminophen, ondansetron **OR** ondansetron (ZOFRAN) IV   Vital Signs    Vitals:   06/16/18 0751 06/16/18 1620 06/16/18 1934 06/17/18 0336  BP: 122/65 118/75 131/63 (!) 151/86  Pulse: 71 90 94 100  Resp: 18 18 18 19   Temp: 97.7 F (36.5 C) 98.7 F (37.1 C) 98.8 F (37.1 C) 98.4 F (36.9 C)  TempSrc: Oral Oral Oral   SpO2: 99% 95% 98% 97%  Weight:    163 lb 12.8 oz (74.3 kg)  Height:        Intake/Output Summary  (Last 24 hours) at 06/17/2018 0743 Last data filed at 06/17/2018 0500 Gross per 24 hour  Intake 240 ml  Output 700 ml  Net -460 ml   Filed Weights   06/15/18 0240 06/16/18 0406 06/17/18 0336  Weight: 168 lb (76.2 kg) 164 lb 14.4 oz (74.8 kg) 163 lb 12.8 oz (74.3 kg)    Telemetry    Brief run of Afib. Currently in SR, 1st degree AV block with PVCs and HR 80s-100s - Personally Reviewed  ECG    06/13/18- Afib, 79 bpm, possible prior anteroseptal infarct, nonspecific st/t wave changes - Personally Reviewed  Physical Exam   GEN: No acute distress.  Patient lying comfortably in bed with daughter at bedside. Neck: moderate JVD  Cardiac: RRR, no murmurs, rubs, or gallops.  Respiratory: Crackles and reduced breath sounds heard at b/l bases, clearing with cough. Wheezing throughout. Breathing slightly labored. GI: Soft, nontender, non-distended  MS: Left leg 2+ pitting edema to level of hip; No deformity. Neuro:  Nonfocal. Patient appears more coherent today. Psych: Normal affect   Labs    Chemistry Recent Labs  Lab 06/15/18 0455 06/16/18 0303 06/17/18 0543  NA 141 142 141  K 3.4* 3.8 3.8  CL 108 107 105  CO2 25 29 25   GLUCOSE 86 97 94  BUN 10 10 12   CREATININE 0.72 0.86 0.90  CALCIUM 8.4* 8.7* 8.5*  GFRNONAA >60 >60 57*  GFRAA >60 >60 >60  ANIONGAP 8 6 11      Hematology Recent Labs  Lab 06/13/18 2244 06/14/18 0930  WBC 6.4 6.2  RBC 3.25* 3.12*  HGB 10.7* 10.0*  HCT 31.5* 29.8*  MCV 97.0 95.7  MCH 32.8 32.2  MCHC 33.9 33.6  RDW 17.3* 16.7*  PLT 146* 118*    Cardiac Enzymes Recent Labs  Lab 06/13/18 2244 06/14/18 0340 06/14/18 0930 06/14/18 1502  TROPONINI 0.06* 0.05* 0.05* 0.06*   No results for input(s): TROPIPOC in the last 168 hours.   BNP Recent Labs  Lab 06/13/18 2244  BNP 2,611.0*     DDimer No results for input(s): DDIMER in the last 168 hours.   Radiology    No results found.  Cardiac Studies   Echo 03/2018: INTERPRETATION NORMAL  LEFT VENTRICULAR SYSTOLIC FUNCTION WITH MODERATE LVH NORMAL RIGHT VENTRICULAR SYSTOLIC FUNCTION VALVULAR REGURGITATION: MILD MR, TRIVIAL TR NO VALVULAR STENOSIS NO PRIOR STUDY FOR COMPARISON  Echo 06/14/18 Left ventricle: The cavity size was normal. Wall thickness was increased in a pattern of moderate LVH. Systolic function was moderately reduced. The estimated ejection fraction was in the range of 35% to 40%. There is severe hypokinesis of the mid-apicalanteroseptal, anterior, inferior, and apical myocardium. - Aortic valve: Trileaflet; mildly thickened leaflets. - Mitral valve: Calcified annulus. Mildly thickened leaflets . The findings are consistent with mild to moderate stenosis, though transvalvular gradient may be accentuated by mitral regurgitation. There was moderate regurgitation. Mean gradient (D): 7 mm Hg. - Left atrium: The atrium was moderately dilated. - Right ventricle: The cavity size was mildly dilated. Wall thickness was normal. Systolic function was mildly reduced. - Pulmonary arteries: Systolic pressure was moderately increased, in the range of 45 mm Hg to 50 mm Hg. - Pericardium, extracardiac: A trivial pericardial effusion was identified posterior to the heart. There was a left pleural effusion.  Patient Profile     84 y.o. female with PMH significant for chronic diastolic CHF, fall, fractured hip 05/2018 s/p ORIF, HTN, PAD, persistent AFib (dx 03/2018, CHADS2VASc 8) on Eliquis, anemia, and hyperglycemia presented to the ED with weakness, difficulty breathing, and severe LE edema and was found to be volume overloaded and in Afib on exam.   Assessment & Plan    1. Acute systolic CHF / HFrEF -SOB slightly worsened from yesterday but still improving since admission. -Productive cough likely unrelated to fluid as weight returning to baseline of ~162 lb on 6/5 per daughter's report. Current 7/5 weight 163 lb 12.8oz. Cough may be  related to infection.  -Continue daily weights. -Unable to exclude ischemia v. Stress-induced CM -Discussion with patient and daughter regarding [facility]'s desire [facility]. As noted in previous notes, patient and daughter presented with treatment options of cath/stress test v. Medical management. Risks and benefits explained to patient and daughter and both prefer to defer further testing and undergo medical management. -Recommend continued PT / ambulation.  -Replete K to 4.0. 06/17/18 at 3.8. -Recommend spironolactone 12.5mg  daily to help with K+. -Echo results as above show worsening HF.   -Labetalol changed to Coreg 7/2. Continue to monitor HR, electrolytes, LE swelling. Increase Coreg dose to 25mg  BID. -BP elevated to 151/86 -At discharge, recommend send home on 40mg  Lasix PO, BID; Coreg 25mg  BID, Eliquis 5mg  BID, Lipitor 20mg  daily; Lisinopril 10mg  daily; Spironolactone 12.5mg  daily. She will need a BMET in ~ 1 week (message sent to out office if cannot be done at her  facility)  2. Persistent Afib -Currently NSR and HR 80s-100s with run of Afib. -Labetalol changed to Coreg as above. -Eliquis 5mg  BID,if she decides for invasive procedure Eliquis will need to be held prior to procedure (if inpatient would place on heparin gtt) -CHADS2VASc at least 8 (CHF, HTN, Age x2, Stroke x2, PVD, female)  3. Elevated tropinin -Minimally elevated likely in setting of supply demand ischemia secondary to volume overload and peaking at 0.06. -Echo results as above. -Eliquis in place of ASA.  -Patient prefers medical management as above vs. Further testing to explore cardiac ischemia.   4. PVD -Plavix stopped given Eliquis and risk of bleeding -This is not a NSTEMI so no indication for antiplatelet therapy  -OP follow up.  5. Recent hip fracture s/p ORIF: -Continue to recommend PT / daily ambulation. -Per IM  6. Anemia -Stable -Per IM  7. Hyperglycemia -Recommend A1c    For  questions or updates, please contact [facility] Please consult [email or web address] for contact info under Cardiology/STEMI.      Signed, Lennon Alstrom, PA-C  06/17/2018, 7:43 AM      Patient discussed with PA and examined on rounds. Changes made as needed. Patient has been examined by MD and will be staffed by MD.   Eula Listen, PA-C 06/17/2018 9:36 AM

## 2018-06-17 NOTE — Progress Notes (Signed)
Physical Therapy Treatment Patient Details Name: [name] MRN: [id number] DOB: 1931/08/26 Today's Date: 06/17/2018    History of Present Illness Pt admitted to hospital on 06/13/18 with progressive SOB. pt subsequently diagnosed with acute systolic CHF. Pt has a past medical history that includes HTN, PAD, and dizziness. Pt had a L hip nailing performed 05/17/18 secondary to a fracture from a fall. Pt was in SNF from d/c after hip surgery until 06/10/18.    PT Comments    Pt states that she is doing well this morning. Pt eager to work with therapy. Pt demonstrates improvements this session compared to last requiring less assistance with bed mobility, transfers, and gait. Pt instructed in B LE there-ex. Pt fatigues very easy during and requires several rest breaks however pt requires less physical assistance to perform there-ex this session. Pt amb 10' in room with chair follow however fatigues very easily during. At this time pt appears to be limited due to fatigue and lack of endurance. Pt could benefit from continued skilled therapy at this time to improve deficits toward PLOF. PT will continue to work with pt at least 2x/week while admitted. D/c recommendations continue to be SNF, pt and family present during session agreeable to SNF recommendation.   Follow Up Recommendations  SNF     Equipment Recommendations  None recommended by PT    Recommendations for Other Services       Precautions / Restrictions Precautions Precautions: None Restrictions Weight Bearing Restrictions: No    Mobility  Bed Mobility Overal bed mobility: Needs Assistance Bed Mobility: Supine to Sit     Supine to sit: Min guard     General bed mobility comments: pt requires CGA to go supine to sitting EOB. Pt able to maintain sitting EOB without UE support  Transfers Overall transfer level: Needs assistance Equipment used: Rolling walker (2 wheeled) Transfers: Sit to/from Stand Sit to Stand: Min  guard         General transfer comment: Pt requires CGA assist sit<>stand however uses RW safely and appropriately and demonstrates good safety awareness through hand placement  Ambulation/Gait Ambulation/Gait assistance: Min guard Gait Distance (Feet): 10 Feet Assistive device: Rolling walker (2 wheeled) Gait Pattern/deviations: Step-through pattern;Decreased step length - right;Decreased step length - left;Trunk flexed     General Gait Details: pt amb 10' with CGA and a chair follow. Pt fatigues very easily and gets SOB quickly. Demonstrates safe use of RW.   Stairs             Wheelchair Mobility    Modified Rankin (Stroke Patients Only)       Balance                                            Cognition Arousal/Alertness: Awake/alert Behavior During Therapy: WFL for tasks assessed/performed Overall Cognitive Status: Within Functional Limits for tasks assessed                                        Exercises Other Exercises Other Exercises: pt instructed in B LE exercises x12 SLR, hip abd, supine heel slides, LAQ, seated heel raises, and standing marching. Pt fatigues very easily with ther-ex and requires several rest breaks.    General Comments  Pertinent Vitals/Pain Pain Assessment: No/denies pain    Home Living                      Prior Function            PT Goals (current goals can now be found in the care plan section) Acute Rehab PT Goals Patient Stated Goal: pt states that she wants to be active PT Goal Formulation: With patient Time For Goal Achievement: 06/29/18 Potential to Achieve Goals: Fair Progress towards PT goals: Progressing toward goals    Frequency    Min 2X/week      PT Plan Current plan remains appropriate    Co-evaluation              AM-PAC PT "6 Clicks" Daily Activity  Outcome Measure  Difficulty turning over in bed (including adjusting bedclothes,  sheets and blankets)?: A Little Difficulty moving from lying on back to sitting on the side of the bed? : A Little Difficulty sitting down on and standing up from a chair with arms (e.g., wheelchair, bedside commode, etc,.)?: Unable Help needed moving to and from a bed to chair (including a wheelchair)?: A Little Help needed walking in hospital room?: A Little Help needed climbing 3-5 steps with a railing? : A Lot 6 Click Score: 15    End of Session Equipment Utilized During Treatment: Gait belt Activity Tolerance: Patient tolerated treatment well;Patient limited by fatigue Patient left: in chair;with call bell/phone within reach;with chair alarm set;with family/visitor present Nurse Communication: Mobility status PT Visit Diagnosis: Unsteadiness on feet (R26.81);Other abnormalities of gait and mobility (R26.89);Repeated falls (R29.6);Muscle weakness (generalized) (M62.81);History of falling (Z91.81);Difficulty in walking, not elsewhere classified (R26.2)     Time: [id number] PT Time Calculation (min) (ACUTE ONLY): 30 min  Charges:  $Gait Training: 8-22 mins $Therapeutic Exercise: 8-22 mins                    G Codes:       Baker Kogler, SPT    Tamie Minteer 06/17/2018, 12:47 PM

## 2018-06-17 NOTE — Clinical Social Work Note (Signed)
CSW spoke with Tresa EndoKelly, admissions Coordinator [facility] and [facility] can [facility] patient. [facility]Kelly [facility] CSW spoke with patient's daughter Sherlon HandingJulene Melby and they would like to accept bed offer. CSW will continue to follow for discharge planning.   Ruthe Mannanandace Jahron Hunsinger MSW ,[address] RdCSWA [phone]

## 2018-06-18 LAB — BASIC METABOLIC PANEL
Anion gap: 8 (ref 5–15)
BUN: 15 mg/dL (ref 8–23)
CO2: 27 mmol/L (ref 22–32)
Calcium: 8.5 mg/dL — ABNORMAL LOW (ref 8.9–10.3)
Chloride: 104 mmol/L (ref 98–111)
Creatinine, Ser: 1.05 mg/dL — ABNORMAL HIGH (ref 0.44–1.00)
GFR calc Af Amer: 55 mL/min — ABNORMAL LOW (ref >60)
GFR, EST NON AFRICAN AMERICAN: 47 mL/min — AB (ref >60)
GLUCOSE: 91 mg/dL (ref 70–99)
POTASSIUM: 3.5 mmol/L (ref 3.5–5.1)
Sodium: 139 mmol/L (ref 135–145)

## 2018-06-18 LAB — HEPATITIS C ANTIBODY

## 2018-06-18 MED ORDER — LISINOPRIL 5 MG PO TABS
2.5000 mg | ORAL_TABLET | Freq: Every day | ORAL | Status: DC
  Administered 2018-06-18: 2.5 mg via ORAL
  Filled 2018-06-18: qty 1

## 2018-06-18 MED ORDER — LISINOPRIL 2.5 MG PO TABS: 2.5000 mg | ORAL_TABLET | Freq: Every day | ORAL | 0 refills | Status: DC

## 2018-06-18 NOTE — Progress Notes (Signed)
IV was removed. Patient was transported via EMS to a rehab facility.  Christean GriefShylon M Vietta Bonifield, RN

## 2018-06-18 NOTE — Progress Notes (Signed)
Patient ID: [name], female   DOB: June 17, 1931, 84 y.o.   MRN: [id number]  [employer] PROGRESS NOTE  [name] [id number] DOB: June 17, 1931 DOA: 06/13/2018 PCP: [name], Narges, MD  HPI/Subjective: Patient slept well without cough.  Has some cough this morning.  Objective: Vitals:   06/18/18 0402 06/18/18 0813  BP: (!) 99/52   Pulse: (!) 104   Resp:    Temp: 98.3 F (36.8 C)   SpO2: 100% 97%    Filed Weights   06/16/18 0406 06/17/18 0336 06/18/18 0402  Weight: 74.8 kg (164 lb 14.4 oz) 74.3 kg (163 lb 12.8 oz) 72.1 kg (159 lb)    ROS: Review of Systems  Constitutional: Negative for chills and fever.  Eyes: Negative for blurred vision.  Respiratory: Positive for cough. Negative for shortness of breath.   Cardiovascular: Negative for chest pain.  Gastrointestinal: Negative for abdominal pain, constipation, diarrhea, nausea and vomiting.  Genitourinary: Negative for dysuria.  Musculoskeletal: Negative for joint pain.  Neurological: Negative for dizziness and headaches.   Exam: Physical Exam  Constitutional: She is oriented to person, place, and time.  HENT:  Nose: No mucosal edema.  Mouth/Throat: No oropharyngeal exudate or posterior oropharyngeal edema.  Eyes: Pupils are equal, round, and reactive to light. Conjunctivae, EOM and lids are normal.  Neck: No JVD present. Carotid bruit is not present. No edema present. No thyroid mass and no thyromegaly present.  Cardiovascular: S1 normal and S2 normal. Exam reveals no gallop.  No murmur heard. Pulses:      Dorsalis pedis pulses are 2+ on the right side, and 2+ on the left side.  Respiratory: No respiratory distress. She has decreased breath sounds in the right lower field and the left lower field. She has no wheezes. She has no rhonchi. She has no rales.  GI: Soft. Bowel sounds are normal. There is no tenderness.  Musculoskeletal:       Right ankle: She exhibits swelling.       Left ankle: She exhibits  swelling.  Lymphadenopathy:    She has no cervical adenopathy.  Neurological: She is alert and oriented to person, place, and time. No cranial nerve deficit.  Skin: Skin is warm. Nails show no clubbing.  Chronic lower extremity discoloration  Psychiatric: She has a normal mood and affect.      Data Reviewed: Basic Metabolic Panel: Recent Labs  Lab 06/14/18 0930 06/15/18 0455 06/16/18 0303 06/17/18 0543 06/18/18 0414  NA 142 141 142 141 139  K 3.8 3.4* 3.8 3.8 3.5  CL 109 108 107 105 104  CO2 23 25 29 25 27   GLUCOSE 90 86 97 94 91  BUN 9 10 10 12 15   CREATININE 0.71 0.72 0.86 0.90 1.05*  CALCIUM 8.6* 8.4* 8.7* 8.5* 8.5*  MG  --   --  1.7  --   --    CBC: Recent Labs  Lab 06/13/18 2244 06/14/18 0930 06/17/18 1040  WBC 6.4 6.2 8.6  NEUTROABS 4.2  --   --   HGB 10.7* 10.0* 11.2*  HCT 31.5* 29.8* 32.9*  MCV 97.0 95.7 95.2  PLT 146* 118* 138*   Cardiac Enzymes: Recent Labs  Lab 06/13/18 2244 06/14/18 0340 06/14/18 0930 06/14/18 1502  TROPONINI 0.06* 0.05* 0.05* 0.06*   BNP (last 3 results) Recent Labs    06/13/18 2244 06/17/18 1040  BNP 2,611.0* 1,400.0*     Scheduled Meds: . apixaban  5 mg Oral BID  . atorvastatin  20 mg  Oral q1800  . azithromycin  250 mg Oral Daily  . budesonide (PULMICORT) nebulizer solution  0.5 mg Nebulization BID  . carvedilol  25 mg Oral BID WC  . furosemide  40 mg Oral BID  . ipratropium-albuterol  3 mL Nebulization TID  . lisinopril  2.5 mg Oral Daily  . potassium chloride  20 mEq Oral Daily  . spironolactone  12.5 mg Oral Daily    Assessment/Plan:  1. Acute on chronic systolic congestive heart failure.  Patient on Coreg, lisinopril, Lasix and  spironolactone.  With blood pressure on the lower side now can decrease lisinopril dose down to 2.5 mg daily 2. Cardiomyopathy.  Family has declined catheterization Myoview at this time.  Will need outpatient follow-up 3. Persistent atrial fibrillation on Eliquis and  Coreg. 4. Bronchitis.  Start Zithromax and nebulizer treatments with budesonide and DuoNeb 5. Recent left hip fracture and weakness.  Physical therapy recommended rehab.  Family interested in going to rehab. 6. Hyperlipidemia unspecified on atorvastatin 7. Elevated troponin demand ischemia from underlying congestive heart failure 8. Thrombocytopenia. hepatitis C negative  Code Status:     Code Status Orders  (From admission, onward)        Start     Ordered   06/14/18 0244  Full code  Continuous     06/14/18 0243    Code Status History    Date Active Date Inactive Code Status Order ID Comments User Context   05/15/2018 1749 05/18/2018 1917 Full Code [id number]  Houston Siren, MD Inpatient   03/24/2017 1454 03/25/2017 1638 Full Code [id number]  Iran Ouch, MD Inpatient   02/24/2017 1234 02/24/2017 1936 Full Code [id number]  Iran Ouch, MD Inpatient   06/28/2016 2128 06/30/2016 2026 DNR [id number]  Houston Siren, MD Inpatient     Family Communication: Daughter at the bedside Disposition Plan:   rehab today  Consultants:  Cardiology  Time spent: 31 minutes  Karren Newland [employer]

## 2018-06-18 NOTE — Clinical Social Work Note (Signed)
The patient will discharge today [facility] via non[facility]emergent EMS. The patient, her daughter, and the facility are aware and in agreement. The CSW has delivered the discharge packet to the chart and the documentation to the facility. The CSW is signing off. Please consult should additional needs arise.  Argentina PonderKaren Martha Naithan Delage, MSW, Theresia MajorsLCSWA [phone]

## 2018-06-21 ENCOUNTER — Telehealth: Payer: Self-pay | Admitting: *Deleted

## 2018-06-21 NOTE — Telephone Encounter (Signed)
-----   Message from Coralee RudSabrina F Gilley sent at 06/21/2018  3:01 PM EDT ----- Regarding: tcm/ph 7/10 2:30 Eula Listenyan Dunn, PA

## 2018-06-21 NOTE — Telephone Encounter (Signed)
Called for TCM. Patient's son answered. Patient is in the nursing home. They are aware of appointment tomorrow.

## 2018-06-22 ENCOUNTER — Other Ambulatory Visit
Admission: RE | Admit: 2018-06-22 | Discharge: 2018-06-22 | Disposition: A | Discharge status: Home / Self Care | Payer: Medicare Other | Source: Ambulatory Visit | Attending: Physician Assistant | Admitting: Physician Assistant

## 2018-06-22 ENCOUNTER — Ambulatory Visit (INDEPENDENT_AMBULATORY_CARE_PROVIDER_SITE_OTHER): Payer: Medicare Other | Admitting: Physician Assistant

## 2018-06-22 ENCOUNTER — Encounter: Payer: Self-pay | Admitting: Physician Assistant

## 2018-06-22 VITALS — BP 116/71 | HR 92 | Ht 66.0 in | Wt 159.2 lb

## 2018-06-22 DIAGNOSIS — I272 Pulmonary hypertension, unspecified: Secondary | ICD-10-CM | POA: Insufficient documentation

## 2018-06-22 DIAGNOSIS — I10 Essential (primary) hypertension: Secondary | ICD-10-CM

## 2018-06-22 DIAGNOSIS — D649 Anemia, unspecified: Secondary | ICD-10-CM

## 2018-06-22 DIAGNOSIS — I5042 Chronic combined systolic (congestive) and diastolic (congestive) heart failure: Secondary | ICD-10-CM | POA: Diagnosis not present

## 2018-06-22 DIAGNOSIS — I4819 Other persistent atrial fibrillation: Secondary | ICD-10-CM

## 2018-06-22 DIAGNOSIS — R748 Abnormal levels of other serum enzymes: Secondary | ICD-10-CM

## 2018-06-22 DIAGNOSIS — R778 Other specified abnormalities of plasma proteins: Secondary | ICD-10-CM

## 2018-06-22 DIAGNOSIS — I739 Peripheral vascular disease, unspecified: Secondary | ICD-10-CM

## 2018-06-22 DIAGNOSIS — I481 Persistent atrial fibrillation: Secondary | ICD-10-CM

## 2018-06-22 DIAGNOSIS — R7989 Other specified abnormal findings of blood chemistry: Secondary | ICD-10-CM

## 2018-06-22 LAB — BASIC METABOLIC PANEL
ANION GAP: 9 (ref 5–15)
BUN: 14 mg/dL (ref 8–23)
CO2: 31 mmol/L (ref 22–32)
Calcium: 9.5 mg/dL (ref 8.9–10.3)
Chloride: 99 mmol/L (ref 98–111)
Creatinine, Ser: 1.05 mg/dL — ABNORMAL HIGH (ref 0.44–1.00)
GFR, EST AFRICAN AMERICAN: 55 mL/min — AB (ref >60)
GFR, EST NON AFRICAN AMERICAN: 47 mL/min — AB (ref >60)
GLUCOSE: 108 mg/dL — AB (ref 70–99)
POTASSIUM: 4.2 mmol/L (ref 3.5–5.1)
Sodium: 139 mmol/L (ref 135–145)

## 2018-06-22 NOTE — Progress Notes (Signed)
Cardiology Office Note Date:  06/22/2018  Patient ID:  [name], [name] 1930/12/21, MRN [id number] PCP:  Guy Begin, MD  Cardiologist:  Dr. Kirke Corin, MD    Chief Complaint: Hospital follow up  History of Present Illness: [name] is a 84 y.o. female with history of recently diagnosed Afib in 03/2018 on Eliquis, chronic combined systolic and diastolic CHF, stroke, PVD s/p right atherectomy on Plavix, CKD stage III, recent left hip fracture in 05/2018 s/p ORIF, and HTN who presents for hospital follow up after recent admission to [facility] from 7/2-7/6 for volume overload.   Patient was admitted to [facility] in 03/2018 with bilateral lower extremity weakness and noted to be in new onset Afib. She was started on Eliquis at that time. 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 Evidence of high-grade stenosis of multiple vessels. She was also noted to be positive for influenza A. Echo in 03/2018 showed normal LV systolic function with moderate LVH, normal RV systolic function, mild MR, trivial TR, no valvular stenoses. She was admitted to [facility] in early 05/2018 with a mechanical fall leading to a fractured left hip. Her Eliquis was held and she underwent successful ORIF on 6/3. She was resumed on Eliquis at discharge. Documented discharge weight of 150 pounds. Upon arrival to rehab on 6/5 her weight was noted to be 162 pounds per the patient's daughter. Throughout her stay at rehab she continued to slowly gain weight and on the day of her discharge from rehab (6/28) she had a weight of 172 pounds. She returned to [facility] on 7/2 with increased SOB and volume overload with an admission weight of 175 pounds. Echo on 7/2/219 showed an EF of 35-40%, severe HK of the mid-apicalanteroseptal, anterior, inferior, and apical myocardium, calcified mitral annulus with  mild to moderate MS (though the transvalvular gradient may have been accentuated by her MR), moderate MR, moderately dilated left atrium, mildly dilated RV with mildly reduced RVSF, PASP 45-50 mmHg, trivial pericardial effusion was noted posterior to the heart. Patient her her family preferred to medically manage her heart failure during the admission and declined any invasive and noninvasive ischemic testing. She was diuresed with a discharge weight of 163 pounds. She remained in sinus rhythm during her admission. She was discharged on Coreg 25 mg bid, Lipitor 20 mg daily, Eliquis 5 mg bid, Lasix 40 mg bid, lisinopril 2.5 mg daily, KCl 20 mEq daily, spironolactone 12.5 mg daily along with her non-cardiac medications. Discharge SCr of 1.05, K+ 3.5, BNP 1,400 (improved from 2,611 at time of admission), HGB 11.2, PLT 138.   She comes in accompanied by her daughter today and is doing well from a cardiac perspective.  Weight noted to be down to 159.4 pounds today.  She has a stable 2 pillow orthopnea and denies any lower extremity swelling, PND, cough, or early satiety.  She is tolerating all medications without issues including Eliquis, 5 mg twice daily, carvedilol 25 mg twice daily, Lasix 40 mg twice daily, lisinopril 2.5 mg daily, and spironolactone 12.5 mg daily in addition to her noncardiac medications.  She continues to monitor her p.o. fluid and salt intake.  She will be discharged from rehab on 7/12 and will be living with her daughter starting on 7/13.  No chest pain, palpitations, dizziness, presyncope, or syncope.  No falls.  No BRBPR  or melena.  She plans on canceling her appointment with the [facility] CHF clinic.   Past Medical History:  Diagnosis Date  . Arthritis    "maybe a little" (03/24/2017)  . Chronic diastolic CHF (congestive heart failure) (HCC)    a. TTE 4/19: nl LVSF, nl RVSF, mod LVH, mild MR, trivial TR  . Dizziness   . Fall    a. fractured left hip 05/2018 s/p ORIF  . Hypertension    . PAD (peripheral artery disease) (HCC)   . Persistent atrial fibrillation (HCC)    a. diagnosed 03/2018; b. CHADS2VASc 8 (CHF, HTN, age x 2, stroke x 2, vascular disease, female); c. Eliquis    Past Surgical History:  Procedure Laterality Date  . ABDOMINAL AORTIC ANEURYSM REPAIR     hx/notes 07/26/2012  . ABDOMINAL AORTOGRAM N/A 02/24/2017   Procedure: Abdominal Aortogram;  Surgeon: Iran Ouch, MD;  Location: [facility] INVASIVE CV LAB;  Service: Cardiovascular;  Laterality: N/A;  . HERNIA REPAIR    . INTRAMEDULLARY (IM) NAIL INTERTROCHANTERIC Left 05/16/2018   Procedure: INTRAMEDULLARY (IM) NAIL INTERTROCHANTRIC;  Surgeon: Kennedy Bucker, MD;  Location: [facility] ORS;  Service: Orthopedics;  Laterality: Left;  . JOINT REPLACEMENT    . LAPAROSCOPIC INCISIONAL / UMBILICAL / VENTRAL HERNIA REPAIR  2010   VHR w/mesh hx/notes 07/26/2012  . LOWER EXTREMITY ANGIOGRAPHY Bilateral 02/24/2017   Procedure: Lower Extremity Angiography;  Surgeon: Iran Ouch, MD;  Location: [facility] INVASIVE CV LAB;  Service: Cardiovascular;  Laterality: Bilateral;  . PERIPHERAL VASCULAR ATHERECTOMY  03/24/2017  . PERIPHERAL VASCULAR ATHERECTOMY Right 03/24/2017   Procedure: Peripheral Vascular Atherectomy;  Surgeon: Iran Ouch, MD;  Location: [facility] INVASIVE CV LAB;  Service: Cardiovascular;  Laterality: Right;  . TOTAL KNEE ARTHROPLASTY Left     Current Meds  Medication Sig  . acetaminophen (TYLENOL) 325 MG tablet Take 2 tablets (650 mg total) by mouth every 6 (six) hours as needed for mild pain or moderate pain (or Fever >/= 101).  Marland Kitchen atorvastatin (LIPITOR) 20 MG tablet Take 1 tablet (20 mg total) by mouth daily at 6 PM.  . azithromycin (ZITHROMAX) 250 MG tablet One tab daily for three days  . budesonide (PULMICORT) 0.5 MG/2ML nebulizer solution Take 2 mLs (0.5 mg total) by nebulization 2 (two) times daily.  . carvedilol (COREG) 25 MG tablet Take 1 tablet (25 mg total) by mouth 2 (two) times daily with a meal.  .  cholecalciferol (VITAMIN D) 1000 units tablet Take 1,000 Units by mouth daily.   Marland Kitchen ELIQUIS 5 MG TABS tablet Take 5 mg by mouth 2 (two) times daily.  . furosemide (LASIX) 40 MG tablet Take 1 tablet (40 mg total) by mouth 2 (two) times daily.  Marland Kitchen guaiFENesin (ROBITUSSIN) 100 MG/5ML SOLN Take 5 mLs (100 mg total) by mouth every 4 (four) hours as needed for cough or to loosen phlegm.  Marland Kitchen HYDROcodone-acetaminophen (NORCO/VICODIN) 5-325 MG tablet Take 1 tablet by mouth every 6 (six) hours as needed for severe pain.  Marland Kitchen ipratropium-albuterol (DUONEB) 0.5-2.5 (3) MG/3ML SOLN Take 3 mLs by nebulization every 6 (six) hours.  Marland Kitchen lisinopril (PRINIVIL,ZESTRIL) 2.5 MG tablet Take 1 tablet (2.5 mg total) by mouth daily.  . potassium chloride SA (K-DUR,KLOR-CON) 20 MEQ tablet Take 1 tablet (20 mEq total) by mouth daily.  Marland Kitchen spironolactone (ALDACTONE) 25 MG tablet Take 0.5 tablets (12.5 mg total) by mouth daily.    Allergies:   Sulfa antibiotics   Social History:  The patient  reports that she  has never smoked. She has never used smokeless tobacco. She reports that she does not drink alcohol or use drugs.   Family History:  The patient's family history includes Cancer in her brother; Heart attack in her father; Hypertension in her mother and sister; Stroke in her brother.  ROS:   Review of Systems  Constitutional: Positive for malaise/fatigue. Negative for chills, diaphoresis, fever and weight loss.  HENT: Negative for congestion.   Eyes: Negative for discharge and redness.  Respiratory: Negative for cough, hemoptysis, sputum production, shortness of breath and wheezing.   Cardiovascular: Negative for chest pain, palpitations, orthopnea, claudication, leg swelling and PND.  Gastrointestinal: Negative for abdominal pain, blood in stool, heartburn, melena, nausea and vomiting.  Genitourinary: Negative for hematuria.  Musculoskeletal: Negative for falls and myalgias.  Skin: Negative for rash.  Neurological:  Positive for weakness. Negative for dizziness, tingling, tremors, sensory change, speech change, focal weakness and loss of consciousness.  Endo/Heme/Allergies: Does not bruise/bleed easily.  Psychiatric/Behavioral: Negative for substance abuse. The patient is not nervous/anxious.   All other systems reviewed and are negative.    PHYSICAL EXAM:  VS:  BP 116/71 (BP Location: Left Arm, Patient Position: Sitting, Cuff Size: Normal)   Pulse 92   Ht 5\' 6"  (1.676 m)   Wt 159 lb 4 oz (72.2 kg)   BMI 25.70 kg/m  BMI: Body mass index is 25.7 kg/m.  Physical Exam  Constitutional: She is oriented to person, place, and time. She appears well-developed and well-nourished.  Elderly and frail-appearing  HENT:  Head: Normocephalic and atraumatic.  Eyes: Right eye exhibits no discharge. Left eye exhibits no discharge.  Neck: Normal range of motion. No JVD present.  Cardiovascular: Normal rate, regular rhythm, S1 normal and S2 normal. Exam reveals no distant heart sounds, no friction rub, no midsystolic click and no opening snap.  Murmur heard. High-pitched blowing holosystolic murmur is present with a grade of 2/6 at the apex.  Low-pitched rumbling crescendo presystolic murmur is present with a grade of 1/6 at the apex. Pulses:      Posterior tibial pulses are 1+ on the right side, and 1+ on the left side.  Pulmonary/Chest: Effort normal and breath sounds normal. No respiratory distress. She has no decreased breath sounds. She has no wheezes. She has no rales. She exhibits no tenderness.  Abdominal: Soft. She exhibits no distension. There is no tenderness.  Musculoskeletal: She exhibits no edema.  Neurological: She is alert and oriented to person, place, and time.  Skin: Skin is warm and dry. No cyanosis. Nails show no clubbing.  Psychiatric: She has a normal mood and affect. Her speech is normal and behavior is normal. Judgment and thought content normal.     EKG:  Was ordered and interpreted by  me today. Shows sinus rhythm, 92 bpm, first-degree AV block, LVH, possible inferior/anterior infarct, nonspecific ST-T changes  Recent Labs: 06/16/2018: Magnesium 1.7 06/17/2018: B Natriuretic Peptide 1,400.0; Hemoglobin 11.2; Platelets 138 06/18/2018: BUN 15; Creatinine, Ser 1.05; Potassium 3.5; Sodium 139  No results found for requested labs within last 8760 hours.   Estimated Creatinine Clearance: 40.6 mL/min (A) (by C-G formula based on SCr of 1.05 mg/dL (H)).   Wt Readings from Last 3 Encounters:  06/22/18 159 lb 4 oz (72.2 kg)  06/18/18 159 lb (72.1 kg)  05/15/18 150 lb (68 kg)     Other studies reviewed: Additional studies/records reviewed today include: summarized above  ASSESSMENT AND PLAN:  1. Chronic combined systolic and diastolic  CHF/pulmonary hypertension: NYHA class III. She does not appear grossly volume overloaded at this time.  Unable to definitively exclude ischemic cardiomyopathy at this time.  Patient and family continue to prefer medical management over invasive and noninvasive ischemic evaluations.  Patient's weight has remained stable to below her dry weight at 159.4 pounds in the office today.  Continue carvedilol 25 mg twice daily, Lasix 40 mg twice daily, lisinopril 2.5 mg daily, and spironolactone 12.5 mg daily.  Check BMP.  CHF education with daily weights.  Plan to recheck limited echocardiogram to evaluate LV systolic function and approximately 3 months.  If EF remains low at that time would discuss with the patient and family the need for reconsidering ischemic evaluation.  2. Persistent A. fib: Remains in sinus rhythm.  Continue rate control with carvedilol 25 mg twice daily.  Remains on Eliquis 5 mg twice daily for long-term, full dose anticoagulation given her elevated CHADS2VASc of 8.  Of note, the patient was noted to have mild to moderate mitral stenosis on echocardiogram dated 06/14/2018 however, this may have been accentuated by the patient's transvalvular  gradient created by her mitral regurgitation.  Given this, the patient has been continued on Eliquis rather than transitioning to Coumadin.  This will need to be revisited when the patient has repeat echocardiogram as detailed above and if the patient is felt to truly have moderate mitral stenosis we will need to transition her to Coumadin at that time.  3. History of elevated troponin: Never with chest pain.  Felt to be supply demand ischemia in the setting of volume overload.  On Eliquis in place of aspirin.  Patient and family prefer medical management as detailed above.  4. PVD: Previously on Plavix, now on Eliquis as above.  We have discontinued dual therapy during her recent hospital admission due to increased risk of bleeding.  5. Anemia: Recent CBC demonstrated stable/improved hemoglobin.  Per PCP.  6. HTN: Blood pressure well controlled today.  Continue current medications as above.  Disposition: F/u with Dr. Kirke Corin or APP in 3 months.  Current medicines are reviewed at length with the patient today.  The patient did not have any concerns regarding medicines.  Signed, Eula Listen, PA-C 06/22/2018 3:31 PM     [facility] -  [address] [place], [place] [address] [phone]

## 2018-06-22 NOTE — Patient Instructions (Addendum)
Medication Instructions: - Your physician recommends that you continue on your current medications as directed. Please refer to the Current Medication list given to you today.  Labwork: - Your physician recommends that you have lab work today: [employer]  Procedures/Testing: - Your physician has [facility] that you [facility]. Echocardiography is a painless test that uses sound waves to create images of your heart. It provides your doctor with information about the size and shape of your heart and how well your heart's chambers and valves are working. This procedure takes approximately one hour. There are no restrictions for this procedure.  Follow-Up: - Your physician recommends that you schedule a follow-up appointment in: late September/ early October with Dr. Kirke CorinArida.   Any Additional Special Instructions Will Be Listed Below (If Applicable).     If you need a refill on your cardiac medications before your next appointment, please call your pharmacy.

## 2018-06-23 ENCOUNTER — Telehealth: Payer: Self-pay | Admitting: Cardiovascular Disease

## 2018-06-23 MED ORDER — FUROSEMIDE 20 MG PO TABS: 20.0000 mg | ORAL_TABLET | Freq: Two times a day (BID) | ORAL | 3 refills | Status: DC

## 2018-06-23 NOTE — Telephone Encounter (Signed)
Patient daughter returning call for lab results If not home when returning call, please leave message

## 2018-06-23 NOTE — Telephone Encounter (Signed)
Spoke with patient's daughter, ok per dpr. She verbalized understanding of results and to decrease furosemide to 20mg  BID. Rx sent to pharmacy.

## 2018-06-27 ENCOUNTER — Ambulatory Visit (INDEPENDENT_AMBULATORY_CARE_PROVIDER_SITE_OTHER): Payer: Medicare Other

## 2018-06-27 DIAGNOSIS — I739 Peripheral vascular disease, unspecified: Secondary | ICD-10-CM

## 2018-07-01 ENCOUNTER — Telehealth: Payer: Self-pay | Admitting: *Deleted

## 2018-07-01 DIAGNOSIS — I739 Peripheral vascular disease, unspecified: Secondary | ICD-10-CM

## 2018-07-01 NOTE — Telephone Encounter (Signed)
Patient made aware of results and verbalized understanding.  Repeat orders placed for 12 months.  

## 2018-07-01 NOTE — Telephone Encounter (Signed)
-----   Message from Iran OuchMuhammad A Arida, MD sent at 06/30/2018  2:10 PM EDT ----- Inform patient that ABI was normal on the right and stable on the left side.  Patent angioplasty site.  Repeat studies in 1 year.

## 2018-07-05 ENCOUNTER — Ambulatory Visit: Payer: Medicare Other | Admitting: Family

## 2018-08-12 ENCOUNTER — Other Ambulatory Visit: Payer: Self-pay | Admitting: Family Medicine

## 2018-08-12 ENCOUNTER — Ambulatory Visit: Payer: Self-pay | Admitting: Podiatry

## 2018-08-12 DIAGNOSIS — R928 Other abnormal and inconclusive findings on diagnostic imaging of breast: Secondary | ICD-10-CM

## 2018-08-22 ENCOUNTER — Other Ambulatory Visit: Payer: Self-pay | Admitting: Family Medicine

## 2018-09-02 ENCOUNTER — Ambulatory Visit (INDEPENDENT_AMBULATORY_CARE_PROVIDER_SITE_OTHER): Payer: Medicare Other

## 2018-09-02 ENCOUNTER — Other Ambulatory Visit: Payer: Self-pay

## 2018-09-02 DIAGNOSIS — I272 Pulmonary hypertension, unspecified: Secondary | ICD-10-CM | POA: Diagnosis not present

## 2018-09-02 DIAGNOSIS — I5042 Chronic combined systolic (congestive) and diastolic (congestive) heart failure: Secondary | ICD-10-CM | POA: Diagnosis not present

## 2018-09-08 ENCOUNTER — Telehealth: Payer: Self-pay | Admitting: Cardiovascular Disease

## 2018-09-08 NOTE — Telephone Encounter (Signed)
Patient made aware of results and verbalized understanding. She will keep her follow up on 10/1 with Dr. Kirke Corin.  Notes recorded by Sondra Barges, PA-C on 09/02/2018 at 5:08 PM EDT Echo shows an improvement in her EF (pump function) now 40-45% (prior 35-40%). There continues to be some decreased movement along the front and apex locations of the heart (not new when compared to echo in 06/2018). There is a slight stiffening of the relaxation phase of the heart. Optimal BP and heart rate control is recommended. There is a mildly leaky mitral valve that can be monitored periodically. Would continue Coreg, Lasix, lisinopril, and spironolactone to help with the pump function. Keep planned follow up with Dr. Kirke Corin on 10/1 for continued optimization of medical therapy. If ischemic testing is wanted moving forward (previously has been declined by patient and family), we can discuss this at her follow up.

## 2018-09-08 NOTE — Telephone Encounter (Signed)
Please call with echocardiogram results. Pt also asks if she needs her appt for 10/1

## 2018-09-13 ENCOUNTER — Ambulatory Visit: Payer: Medicare Other | Admitting: Cardiovascular Disease

## 2018-09-13 ENCOUNTER — Encounter: Payer: Self-pay | Admitting: Cardiovascular Disease

## 2018-09-13 VITALS — BP 134/68 | HR 64 | Ht 66.0 in | Wt 145.2 lb

## 2018-09-13 DIAGNOSIS — I5022 Chronic systolic (congestive) heart failure: Secondary | ICD-10-CM

## 2018-09-13 DIAGNOSIS — I10 Essential (primary) hypertension: Secondary | ICD-10-CM

## 2018-09-13 DIAGNOSIS — E785 Hyperlipidemia, unspecified: Secondary | ICD-10-CM | POA: Diagnosis not present

## 2018-09-13 DIAGNOSIS — I480 Paroxysmal atrial fibrillation: Secondary | ICD-10-CM

## 2018-09-13 DIAGNOSIS — I739 Peripheral vascular disease, unspecified: Secondary | ICD-10-CM

## 2018-09-13 MED ORDER — FUROSEMIDE 20 MG PO TABS: 20.0000 mg | ORAL_TABLET | Freq: Every day | ORAL | 3 refills | Status: DC

## 2018-09-13 NOTE — Patient Instructions (Signed)
Medication Instructions: DECREASE the Furosemide to 20 mg daily  If you need a refill on your cardiac medications before your next appointment, please call your pharmacy.   Follow-Up: Your physician wants you to follow-up in 4 months with Dr. Kirke Corin. You will receive a reminder letter in the mail two months in advance. If you don't receive a letter, please call our office at [phone] to schedule this follow-up appointment.  Thank you for choosing [facility] at [facility]!

## 2018-09-13 NOTE — Progress Notes (Signed)
Cardiology Office Note   Date:  09/13/2018   ID:  [name], DOB Aug 30, 1931, MRN [id number]  PCP:  Guy Begin, MD  Cardiologist:   Lorine Bears, MD   Chief Complaint  Patient presents with  . other    3 mo f/u, Echo 09/02/18.Weak in her legs Medications reviewed verbally.      History of Present Illness: [name] is a 84 y.o. female who is here today for a follow-up visit regarding peripheral arterial disease, chronic systolic heart failure and paroxysmal atrial fibrillation. The patient has no history of diabetes or tobacco use. She is known to have hypertension. She is known to have peripheral arterial disease with significantly abnormal ABI. The patient was treated in 2018 for lower extremity ulceration. Angiography and March 2018 showed:  1. No significant aortoiliac disease. 2. Right lower extremity: Mild diffuse SFA and popliteal disease with significant stenosis affecting the TP trunk just before the origin of the peroneal artery which was the only patent vessel below the knee. 3. Left lower extremity: Moderate left SFA stenosis with occluded popliteal artery and tibial peroneal vessels with only visible collaterals and short segments of reconstitution. No revascularization options of the left lower extremity. She has no targets for bypass and no options for endovascular intervention given that there is no reconstitution of her tibial peroneal vessels. Due to worsening wound affecting the right lower extremity, I proceeded with successful orbital atherectomy and balloon angioplasty of the right distal popliteal artery, TP trunk and peroneal artery in April of 2018.   She had no recurrent wound since then. She was diagnosed with atrial fibrillation in April 2019 and was started on anticoagulation with Eliquis.  She was hospitalized in June after a fall and left hip fracture.  She underwent ORIF during the hospitalization.  She returned in July to the hospital  with volume overload.  Echocardiogram showed an EF of 35 to 40% with severe hypokinesis of the mid and apical anteroseptal, anterior, inferior and apical myocardium.  There was moderate mitral regurgitation.  The patient and family elected for conservative measures and no invasive procedures.  The patient was treated medically with subsequent improvement in symptoms.  She is now doing reasonably well with stable shortness of breath.  She is able to walk with a walker.  She continues to lose weight.  No chest pain.  No lower extremity claudication.   Past Medical History:  Diagnosis Date  . Arthritis    "maybe a little" (03/24/2017)  . Chronic diastolic CHF (congestive heart failure) (HCC)    a. TTE 4/19: nl LVSF, nl RVSF, mod LVH, mild MR, trivial TR  . Dizziness   . Fall    a. fractured left hip 05/2018 s/p ORIF  . Hypertension   . PAD (peripheral artery disease) (HCC)   . Persistent atrial fibrillation    a. diagnosed 03/2018; b. CHADS2VASc 8 (CHF, HTN, age x 2, stroke x 2, vascular disease, female); c. Eliquis    Past Surgical History:  Procedure Laterality Date  . ABDOMINAL AORTIC ANEURYSM REPAIR     hx/notes 07/26/2012  . ABDOMINAL AORTOGRAM N/A 02/24/2017   Procedure: Abdominal Aortogram;  Surgeon: Iran Ouch, MD;  Location: [facility];  Service: Cardiovascular;  Laterality: N/A;  . HERNIA REPAIR    . INTRAMEDULLARY (IM) NAIL INTERTROCHANTERIC Left 05/16/2018   Procedure: INTRAMEDULLARY (IM) NAIL INTERTROCHANTRIC;  Surgeon: Kennedy Bucker, MD;  Location: [facility];  Service: Orthopedics;  Laterality: Left;  .  JOINT REPLACEMENT    . LAPAROSCOPIC INCISIONAL / UMBILICAL / VENTRAL HERNIA REPAIR  2010   VHR w/mesh hx/notes 07/26/2012  . LOWER EXTREMITY ANGIOGRAPHY Bilateral 02/24/2017   Procedure: Lower Extremity Angiography;  Surgeon: Iran Ouch, MD;  Location: [facility];  Service: Cardiovascular;  Laterality: Bilateral;  . PERIPHERAL VASCULAR ATHERECTOMY   03/24/2017  . PERIPHERAL VASCULAR ATHERECTOMY Right 03/24/2017   Procedure: Peripheral Vascular Atherectomy;  Surgeon: Iran Ouch, MD;  Location: [facility];  Service: Cardiovascular;  Laterality: Right;  . TOTAL KNEE ARTHROPLASTY Left      Current Outpatient Medications  Medication Sig Dispense Refill  . acetaminophen (TYLENOL) 325 MG tablet Take 2 tablets (650 mg total) by mouth every 6 (six) hours as needed for mild pain or moderate pain (or Fever >/= 101).    Marland Kitchen atorvastatin (LIPITOR) 20 MG tablet Take 1 tablet (20 mg total) by mouth daily at 6 PM. 30 tablet 6  . azithromycin (ZITHROMAX) 250 MG tablet One tab daily for three days 3 each 0  . budesonide (PULMICORT) 0.5 MG/2ML nebulizer solution Take 2 mLs (0.5 mg total) by nebulization 2 (two) times daily. 120 mL 0  . carvedilol (COREG) 25 MG tablet Take 1 tablet (25 mg total) by mouth 2 (two) times daily with a meal. 60 tablet 0  . cholecalciferol (VITAMIN D) 1000 units tablet Take 1,000 Units by mouth daily.     Marland Kitchen ELIQUIS 5 MG TABS tablet Take 5 mg by mouth 2 (two) times daily.  5  . furosemide (LASIX) 20 MG tablet Take 1 tablet (20 mg total) by mouth 2 (two) times daily. 180 tablet 3  . guaiFENesin (ROBITUSSIN) 100 MG/5ML SOLN Take 5 mLs (100 mg total) by mouth every 4 (four) hours as needed for cough or to loosen phlegm. 118 mL 0  . HYDROcodone-acetaminophen (NORCO/VICODIN) 5-325 MG tablet Take 1 tablet by mouth every 6 (six) hours as needed for severe pain. 12 tablet 0  . ipratropium-albuterol (DUONEB) 0.5-2.5 (3) MG/3ML SOLN Take 3 mLs by nebulization every 6 (six) hours. 360 mL 0  . lisinopril (PRINIVIL,ZESTRIL) 2.5 MG tablet Take 1 tablet (2.5 mg total) by mouth daily. 30 tablet 0  . potassium chloride SA (K-DUR,KLOR-CON) 20 MEQ tablet Take 1 tablet (20 mEq total) by mouth daily. 30 tablet 0  . spironolactone (ALDACTONE) 25 MG tablet Take 0.5 tablets (12.5 mg total) by mouth daily. 30 tablet 0   No current  facility-administered medications for this visit.     Allergies:   Sulfa antibiotics    Social History:  The patient  reports that she has never smoked. She has never used smokeless tobacco. She reports that she does not drink alcohol or use drugs.   Family History:  The patient's Family history is negative for coronary artery disease.   ROS:  Please see the history of present illness.   Otherwise, review of systems are positive for none.   All other systems are reviewed and negative.    PHYSICAL EXAM: VS:  BP 134/68 (BP Location: Right Arm, Patient Position: Sitting, Cuff Size: Normal)   Ht 5\' 6"  (1.676 m)   Wt 145 lb 4 oz (65.9 kg)   BMI 23.44 kg/m  , BMI Body mass index is 23.44 kg/m. GEN: Well nourished, well developed, in no acute distress  HEENT: normal  Neck: no JVD, carotid bruits, or masses Cardiac: RRR; no murmurs, rubs, or gallops,no edema  Respiratory:  clear to auscultation bilaterally,  normal work of breathing GI: soft, nontender, nondistended, + BS MS: no deformity or atrophy  Skin: warm and dry, no rash Neuro:  Strength and sensation are intact Psych: euthymic mood, full affect   EKG:  EKG is ordered today. EKG showed sinus bradycardia with first-degree AV block and PVCs.  Possible old inferior and anterior infarcts.   Recent Labs: 06/16/2018: Magnesium 1.7 06/17/2018: B Natriuretic Peptide 1,400.0; Hemoglobin 11.2; Platelets 138 06/22/2018: BUN 14; Creatinine, Ser 1.05; Potassium 4.2; Sodium 139    Lipid Panel No results found for: CHOL, TRIG, HDL, CHOLHDL, VLDL, LDLCALC, LDLDIRECT    Wt Readings from Last 3 Encounters:  09/13/18 145 lb 4 oz (65.9 kg)  06/22/18 159 lb 4 oz (72.2 kg)  06/18/18 159 lb (72.1 kg)         ASSESSMENT AND PLAN:  1.  Chronic systolic heart failure: Recent echocardiogram showed improvement in ejection fraction to 40 to 45% with mild mitral regurgitation.  The patient's weight continue to go down.  I elected to decrease  furosemide to 20 mg daily.  Continue treatment with carvedilol, lisinopril and small dose spironolactone.  2.  Paroxysmal atrial fibrillation: Currently in sinus rhythm.  She is tolerating long-term anticoagulation with Eliquis.  3. Peripheral arterial disease: Currently with no significant claudication or ulceration.  Most recent vascular studies in July showed normal ABI on the right side and stable on the left.  2. Essential hypertension: Blood pressure is well controlled on current medications.  3. Hyperlipidemia: Continue treatment with atorvastatin with a target LDL of less than 70.   Disposition:   FU with me in 4 months.  Signed,  Lorine Bears, MD  09/13/2018 2:06 PM    [facility]

## 2019-03-22 ENCOUNTER — Telehealth: Payer: Self-pay | Admitting: Physician Assistant

## 2019-03-22 NOTE — Telephone Encounter (Signed)
Virtual Visit Pre-Appointment Phone Call  Steps For Call:  1. Confirm consent - "In the setting of the current Covid19 crisis, you are scheduled for a (phone or video) visit with your provider on (date) at (time).  Just as we do with many in-office visits, in order for you to participate in this visit, we must obtain consent.  If you'd like, I can send this to your mychart (if signed up) or email for you to review.  Otherwise, I can obtain your verbal consent now.  All virtual visits are billed to your insurance company just like a normal visit would be.  By agreeing to a virtual visit, we'd like you to understand that the technology does not allow for your provider to perform an examination, and thus may limit your provider's ability to fully assess your condition.  Finally, though the technology is pretty good, we cannot assure that it will always work on either your or our end, and in the setting of a video visit, we may have to convert it to a phone-only visit.  In either situation, we cannot ensure that we have a secure connection.  Are you willing to proceed?"  2. Give patient instructions for WebEx download to smartphone as below if video visit  3. Advise patient to be prepared with any vital sign or heart rhythm information, their current medicines, and a piece of paper and pen handy for any instructions they may receive the day of their visit  4. Inform patient they will receive a phone call 15 minutes prior to their appointment time (may be from unknown caller ID) so they should be prepared to answer  5. Confirm that appointment type is correct in Epic appointment notes (video vs telephone)    TELEPHONE CALL NOTE  [name] has been deemed a candidate for a follow-up tele-health visit to limit community exposure during the Covid-19 pandemic. I spoke with the patient via phone to ensure availability of phone/video source, confirm preferred email & phone number, and discuss  instructions and expectations.  I reminded [name] to be prepared with any vital sign and/or heart rhythm information that could potentially be obtained via home monitoring, at the time of her visit. I reminded [name] to expect a phone call at the time of her visit if her visit.  Did the patient verbally acknowledge consent to treatment? YES  [name] 03/22/2019 3:02 PM   DOWNLOADING THE WEBEX SOFTWARE TO SMARTPHONE  - If Apple, go to [employer] and type in WebEx in the search bar. Download [employer], the blue/green circle. The app is free but as with any other app downloads, their phone may require them to verify saved payment information or Apple password. The patient does NOT have to create an account.  - If Android, ask patient to go to [employer] and type in WebEx in the search bar. Download [employer], the blue/green circle. The app is free but as with any other app downloads, their phone may require them to verify saved payment information or Android password. The patient does NOT have to create an account.   CONSENT FOR TELE-HEALTH VISIT - PLEASE REVIEW  I hereby voluntarily request, consent and authorize [facility] and its employed or contracted physicians, physician assistants, nurse practitioners or other licensed health care professionals (the Practitioner), to provide me with telemedicine health care services (the Services") as deemed necessary by the treating Practitioner. I  acknowledge and consent to receive the Services by the Practitioner via telemedicine. I understand that the telemedicine visit will involve communicating with the Practitioner through live audiovisual communication technology and the disclosure of certain medical information by electronic transmission. I acknowledge that I have been given the opportunity to request an in-person assessment or other available alternative prior to the telemedicine visit and am  voluntarily participating in the telemedicine visit.  I understand that I have the right to withhold or withdraw my consent to the use of telemedicine in the course of my care at any time, without affecting my right to future care or treatment, and that the Practitioner or I may terminate the telemedicine visit at any time. I understand that I have the right to inspect all information obtained and/or recorded in the course of the telemedicine visit and may receive copies of available information for a reasonable fee.  I understand that some of the potential risks of receiving the Services via telemedicine include:   Delay or interruption in medical evaluation due to technological equipment failure or disruption;  Information transmitted may not be sufficient (e.g. poor resolution of images) to allow for appropriate medical decision making by the Practitioner; and/or   In rare instances, security protocols could fail, causing a breach of personal health information.  Furthermore, I acknowledge that it is my responsibility to provide information about my medical history, conditions and care that is complete and accurate to the best of my ability. I acknowledge that Practitioner's advice, recommendations, and/or decision may be based on factors not within their control, such as incomplete or inaccurate data provided by me or distortions of diagnostic images or specimens that may result from electronic transmissions. I understand that the practice of medicine is not an exact science and that Practitioner makes no warranties or guarantees regarding treatment outcomes. I acknowledge that I will receive a copy of this consent concurrently upon execution via email to the email address I last provided but may also request a printed copy by calling the office of [facility].    I understand that my insurance will be billed for this visit.   I have read or had this consent read to me.  I understand the  contents of this consent, which adequately explains the benefits and risks of the Services being provided via telemedicine.   I have been provided ample opportunity to ask questions regarding this consent and the Services and have had my questions answered to my satisfaction.  I give my informed consent for the services to be provided through the use of telemedicine in my medical care  By participating in this telemedicine visit I agree to the above.

## 2019-03-26 NOTE — Progress Notes (Signed)
Virtual Visit via Telephone Note   This visit type was conducted due to national recommendations for restrictions regarding the COVID-19 Pandemic (e.g. social distancing) in an effort to limit this patient's exposure and mitigate transmission in our community.  Due to her co-morbid illnesses, this patient is at least at moderate risk for complications without adequate follow up.  This format is felt to be most appropriate for this patient at this time.  The patient did not have access to video technology/had technical difficulties with video requiring transitioning to audio format only (telephone).  All issues noted in this document were discussed and addressed.  No physical exam could be performed with this format.  Please refer to the patient's chart for her  consent to telehealth for [facility].   Evaluation Performed:  Follow-up visit  Date:  03/27/2019   ID:  [name], DOB [birth year], MRN [id number]  Patient Location: Home  Provider Location: Home  PCP:  Guy Begin, MD  Cardiologist:  Lorine Bears, MD  Electrophysiologist:  None   Chief Complaint:  Telehealth follow up  History of Present Illness:    [name] is a 85 y.o. female who presents via audio/video conferencing for a telehealth visit today.  She has a history of PAF diagnosed in 03/2018 on Eliquis, chronic combined systolic and diastolic CHF, pulmonary hypertension, stroke, PVD s/p right atherectomy on Plavix, CKD stage III, left hip fracture in 05/2018 s/p ORIF, and HTN.  Patient was admitted to [facility] in 03/2018 with bilateral lower extremity weakness and noted to be in new onset Afib. She was started on Eliquis at that time.CT head w/o evidence of hemorrhage or acute ischemia and CTA withoutevidence of intracranial arterial occlusion but with16 mm saccular aneurysm rightsubclavian and 2 mm aneurysm rightintracranial ICA. Evidence of high-grade stenosis of multiple vessels.She was also noted to be  positive for influenza A. Echo in 03/2018 showed normal LV systolic function with moderate LVH, normal RV systolic function, mild MR, trivial TR, no valvular stenoses. She was admitted to [facility] in early 05/2018 with a mechanical fall leading to a fractured left hip, undergoing successful ORIF. She was resumed on Eliquis at discharge. She returned to [facility] in 06/2018 with increased SOB and volume overload with an admission weight of 175 pounds. Echo on 7/2/219 showed an EF of 35-40%, severe HK of the mid-apicalanteroseptal, anterior, inferior, and apical myocardium, calcified mitral annulus with mild to moderate MS (though the transvalvular gradient may have been accentuated by her MR), moderate MR, moderately dilated left atrium, mildly dilated RV with mildly reduced RVSF, PASP 45-50 mmHg, trivial pericardial effusion was noted posterior to the heart. Patient her her family preferred to medically manage her heart failure during the admission and declined any invasive and noninvasive ischemic testing. She was diuresed with a discharge weight of 163 pounds. She remained in sinus rhythm during her admission.  Follow-up echo in 08/2018 showed improvement in her EF to 40 to 45% with severe hypokinesis of the mid apical anterior septal, anterior, and apical myocardium, grade 1 diastolic dysfunction, mild mitral regurgitation with a calcified mitral annulus.  She was most recently seen in the office in 09/2018 and was doing reasonably well. She continued to lose weight was an office weight of 145 pounds. In this setting, her Lasix was decreased to 20 mg daily at that visit. She was maintaining sinus rhythm. There were no symptoms of significant claudication or ulceration.   Labs: 06/2018 - SCr 1.05, K+ 4.2  She is doing well from a cardiac perspective.  No chest pain, shortness of breath, dizziness, presyncope, or syncope.  No lower extremity swelling.  She has stable two-pillow orthopnea.  She is drinking close to or maybe  slightly less than 2 L of fluids daily.  She is limiting her salt intake.  No falls, BR BPR, or melena.  She states that she is doing well and would not like to make any changes at this time.  The patient does not have symptoms concerning for COVID-19 infection (fever, chills, cough, or new shortness of breath).    Past Medical History:  Diagnosis Date   Arthritis    "maybe a little" (03/24/2017)   Chronic diastolic CHF (congestive heart failure) (HCC)    a. TTE 4/19: nl LVSF, nl RVSF, mod LVH, mild MR, trivial TR   Dizziness    Fall    a. fractured left hip 05/2018 s/p ORIF   Hypertension    PAD (peripheral artery disease) (HCC)    Persistent atrial fibrillation    a. diagnosed 03/2018; b. CHADS2VASc 8 (CHF, HTN, age x 2, stroke x 2, vascular disease, female); c. Eliquis   Past Surgical History:  Procedure Laterality Date   ABDOMINAL AORTIC ANEURYSM REPAIR     hx/notes 07/26/2012   ABDOMINAL AORTOGRAM N/A 02/24/2017   Procedure: Abdominal Aortogram;  Surgeon: Iran Ouch, MD;  Location: [facility];  Service: Cardiovascular;  Laterality: N/A;   HERNIA REPAIR     INTRAMEDULLARY (IM) NAIL INTERTROCHANTERIC Left 05/16/2018   Procedure: INTRAMEDULLARY (IM) NAIL INTERTROCHANTRIC;  Surgeon: Kennedy Bucker, MD;  Location: [facility] ORS;  Service: Orthopedics;  Laterality: Left;   JOINT REPLACEMENT     LAPAROSCOPIC INCISIONAL / UMBILICAL / VENTRAL HERNIA REPAIR  2010   VHR w/mesh hx/notes 07/26/2012   LOWER EXTREMITY ANGIOGRAPHY Bilateral 02/24/2017   Procedure: Lower Extremity Angiography;  Surgeon: Iran Ouch, MD;  Location: [facility] INVAS[facility] CV [facility];  Service: Cardiovascular;  Laterality: Bilateral;   PERIPHERAL VASCULAR ATHERECTOMY  03/24/2017   PERIPHERAL VASCULAR ATHERECTOMY Right 03/24/2017   Procedure: Peripheral Vascular Atherectomy;  Surgeon: Iran Ouch, MD;  Location: [facility]AS[facility];  Service: Cardiovascular;  Laterality: Right;   TOTAL KNEE ARTHROPLASTY  Left      Current Meds  Medication Sig   acetaminophen (TYLENOL) 325 MG tablet Take 2 tablets (650 mg total) by mouth every 6 (six) hours as needed for mild pain or moderate pain (or Fever >/= 101).   atorvastatin (LIPITOR) 20 MG tablet Take 1 tablet (20 mg total) by mouth daily at 6 PM.   budesonide (PULMICORT) 0.5 MG/2ML nebulizer solution Take 2 mLs (0.5 mg total) by nebulization 2 (two) times daily. (Patient taking differently: Take 0.5 mg by nebulization 2 (two) times daily as needed. )   carvedilol (COREG) 25 MG tablet Take 1 tablet (25 mg total) by mouth 2 (two) times daily with a meal.   cholecalciferol (VITAMIN D) 1000 units tablet Take 1,000 Units by mouth daily.    ELIQUIS 5 MG TABS tablet Take 5 mg by mouth 2 (two) times daily.   furosemide (LASIX) 20 MG tablet Take 1 tablet (20 mg total) by mouth daily.   HYDROcodone-acetaminophen (NORCO/VICODIN) 5-325 MG tablet Take 1 tablet by mouth every 6 (six) hours as needed for severe pain.   ipratropium-albuterol (DUONEB) 0.5-2.5 (3) MG/3ML SOLN Take 3 mLs by nebulization every 6 (six) hours.   lisinopril (PRINIVIL,ZESTRIL) 2.5 MG tablet Take 1 tablet (2.5 mg total)  by mouth daily.   potassium chloride SA (K-DUR,KLOR-CON) 20 MEQ tablet Take 1 tablet (20 mEq total) by mouth daily.   spironolactone (ALDACTONE) 25 MG tablet Take 0.5 tablets (12.5 mg total) by mouth daily.     Allergies:   Sulfa antibiotics   Social History   Tobacco Use   Smoking status: Never Smoker   Smokeless tobacco: Never Used  Substance Use Topics   Alcohol use: No    Alcohol/week: 0.0 standard drinks   Drug use: No     Family Hx: The patient's family history includes Cancer in her brother; Heart attack in her father; Hypertension in her mother and sister; Stroke in her brother.  ROS:   Please see the history of present illness.     All other systems reviewed and are negative.   Prior CV studies:   The following studies were reviewed  today:  2D Echo 08/2018: - Left ventricle: The cavity size was normal. There was moderate   concentric hypertrophy. Systolic function was mildly to   moderately reduced. The estimated ejection fraction was in the   range of 40% to 45%. Severe hypokinesis of the   mid-apicalanteroseptal, anterior, and apical myocardium. Doppler   parameters are consistent with abnormal left ventricular   relaxation (grade 1 diastolic dysfunction). - Mitral valve: Calcified annulus. There was mild regurgitation. - Pulmonary arteries: Systolic pressure could not be accurately   estimated. __________  Vascular 06/2018: Right: Resting right ankle-brachial index is within normal range. No evidence of significant right lower extremity arterial disease. The right toe-brachial index is normal. Left: Resting left ankle-brachial index indicates moderate left lower extremity arterial disease. The left toe-brachial index is abnormal.   Suggest follow up study in 12 months. __________  Labs/Other Tests and Data Reviewed:    EKG:  No ECG reviewed.  Recent Labs: 06/16/2018: Magnesium 1.7 06/17/2018: B Natriuretic Peptide 1,400.0; Hemoglobin 11.2; Platelets 138 06/22/2018: BUN 14; Creatinine, Ser 1.05; Potassium 4.2; Sodium 139   Recent Lipid Panel No results found for: CHOL, TRIG, HDL, CHOLHDL, LDLCALC, LDLDIRECT  Wt Readings from Last 3 Encounters:  03/27/19 142 lb (64.4 kg)  09/13/18 145 lb 4 oz (65.9 kg)  06/22/18 159 lb 4 oz (72.2 kg)     Objective:    Vital Signs:  BP 125/61 (BP Location: Left Arm, Patient Position: Sitting)    Ht 5\' 6"  (1.676 m)    Wt 142 lb (64.4 kg)    BMI 22.92 kg/m    Well nourished, well developed female in no acute distress.   ASSESSMENT & PLAN:    1. HFrEF: She denies any symptoms concerning for volume overload.  Was recent echo showed improvement in her LV systolic function as outlined above.  Remains on Coreg, lisinopril, spironolactone, and low-dose Lasix.  I did discuss  with the family and patient the option of discontinuing potassium given her Lasix dose was decreased in the fall 2019 and she is on lisinopril and spironolactone.  The patient and family prefer to remain on current medications without any changes.  Once social distancing restrictions are lifted I recommend a follow-up BMP.  CHF education provided.  2. PAF: No symptoms of tachypalpitations.  Continue rate control with carvedilol.  She remains on long-term anticoagulation with Eliquis and denies any symptoms concerning for bleeding.  3. PAD: She denies any significant claudication symptoms or ulcerations.  Once COVID-19 social distancing restrictions are lessened she will be due for repeat lower extremity arterial ultrasound/ABI in the late  summer to fall off 2020.  4. Hypertension: Blood pressure remains well controlled.  Continue current medications as outlined above.  5. Hyperlipidemia: Continue Lipitor with goal LDL less than 70.  COVID-19 Education: The signs and symptoms of COVID-19 were discussed with the patient and how to seek care for testing (follow up with PCP or arrange E-visit).  The importance of social distancing was discussed today.  Time:   Today, I have spent 7 minutes with the patient with telehealth technology discussing the above problems.     Medication Adjustments/Labs and Tests Ordered: Current medicines are reviewed at length with the patient today.  Concerns regarding medicines are outlined above.  Tests Ordered: No orders of the defined types were placed in this encounter.  Medication Changes: No orders of the defined types were placed in this encounter.   Disposition:  Follow up in 6 month(s)  Signed, Eula Listenyan Kevonte Vanecek, PA-C  03/27/2019 10:01 AM    [facility]

## 2019-03-27 ENCOUNTER — Telehealth (INDEPENDENT_AMBULATORY_CARE_PROVIDER_SITE_OTHER): Payer: Medicare Other | Admitting: Physician Assistant

## 2019-03-27 ENCOUNTER — Other Ambulatory Visit: Payer: Self-pay

## 2019-03-27 ENCOUNTER — Encounter: Payer: Self-pay | Admitting: Physician Assistant

## 2019-03-27 VITALS — BP 125/61 | Ht 66.0 in | Wt 142.0 lb

## 2019-03-27 DIAGNOSIS — I5022 Chronic systolic (congestive) heart failure: Secondary | ICD-10-CM | POA: Diagnosis not present

## 2019-03-27 DIAGNOSIS — E785 Hyperlipidemia, unspecified: Secondary | ICD-10-CM

## 2019-03-27 DIAGNOSIS — I739 Peripheral vascular disease, unspecified: Secondary | ICD-10-CM

## 2019-03-27 DIAGNOSIS — I480 Paroxysmal atrial fibrillation: Secondary | ICD-10-CM

## 2019-03-27 DIAGNOSIS — I10 Essential (primary) hypertension: Secondary | ICD-10-CM

## 2019-03-27 NOTE — Patient Instructions (Signed)
It was a pleasure to speak with you on the phone today! Thank you for allowing us to continue taking care of your Heartcare needs during this time.   Feel free to call as needed for questions and concerns related to your cardiac needs.   Medication Instructions:  Your physician recommends that you continue on your current medications as directed. Please refer to the Current Medication list given to you today.  If you need a refill on your cardiac medications before your next appointment, please call your pharmacy.   Lab work: None ordered  If you have labs (blood work) drawn today and your tests are completely normal, you will receive your results only by: . MyChart Message (if you have MyChart) OR . A paper copy in the mail If you have any lab test that is abnormal or we need to change your treatment, we will call you to review the results.  Testing/Procedures: None ordered   Follow-Up: At [facility], you and your health needs are our priority.  As part of our continuing mission to provide you with exceptional heart care, we have created designated Provider Care Teams.  These Care Teams include your primary Cardiologist (physician) and Advanced Practice Providers (APPs -  Physician Assistants and Nurse Practitioners) who all work together to provide you with the care you need, when you need it. You will need a follow up appointment in 6 months.  Please call our office 2 months in advance to schedule this appointment.  You may see Muhammad Arida, MD or Ryan Dunn, PA-C.    

## 2019-08-10 ENCOUNTER — Other Ambulatory Visit: Payer: Self-pay | Admitting: Physician Assistant

## 2019-12-05 ENCOUNTER — Encounter: Payer: Self-pay | Admitting: Emergency Medicine

## 2019-12-05 ENCOUNTER — Inpatient Hospital Stay
Admission: EM | Admit: 2019-12-05 | Discharge: 2019-12-10 | DRG: 377 | Disposition: A | Discharge status: Hospice | Payer: Medicare Other | Attending: Internal Medicine | Admitting: Internal Medicine

## 2019-12-05 ENCOUNTER — Other Ambulatory Visit: Payer: Self-pay

## 2019-12-05 DIAGNOSIS — E785 Hyperlipidemia, unspecified: Secondary | ICD-10-CM | POA: Diagnosis present

## 2019-12-05 DIAGNOSIS — N170 Acute kidney failure with tubular necrosis: Secondary | ICD-10-CM | POA: Diagnosis present

## 2019-12-05 DIAGNOSIS — Z8673 Personal history of transient ischemic attack (TIA), and cerebral infarction without residual deficits: Secondary | ICD-10-CM

## 2019-12-05 DIAGNOSIS — I10 Essential (primary) hypertension: Secondary | ICD-10-CM

## 2019-12-05 DIAGNOSIS — R579 Shock, unspecified: Secondary | ICD-10-CM | POA: Diagnosis not present

## 2019-12-05 DIAGNOSIS — I5042 Chronic combined systolic (congestive) and diastolic (congestive) heart failure: Secondary | ICD-10-CM | POA: Diagnosis present

## 2019-12-05 DIAGNOSIS — D500 Iron deficiency anemia secondary to blood loss (chronic): Secondary | ICD-10-CM | POA: Diagnosis present

## 2019-12-05 DIAGNOSIS — D696 Thrombocytopenia, unspecified: Secondary | ICD-10-CM | POA: Diagnosis present

## 2019-12-05 DIAGNOSIS — R34 Anuria and oliguria: Secondary | ICD-10-CM | POA: Diagnosis not present

## 2019-12-05 DIAGNOSIS — Z79891 Long term (current) use of opiate analgesic: Secondary | ICD-10-CM

## 2019-12-05 DIAGNOSIS — K7460 Unspecified cirrhosis of liver: Secondary | ICD-10-CM | POA: Diagnosis present

## 2019-12-05 DIAGNOSIS — K766 Portal hypertension: Secondary | ICD-10-CM | POA: Diagnosis present

## 2019-12-05 DIAGNOSIS — I4891 Unspecified atrial fibrillation: Secondary | ICD-10-CM | POA: Diagnosis present

## 2019-12-05 DIAGNOSIS — Z8619 Personal history of other infectious and parasitic diseases: Secondary | ICD-10-CM

## 2019-12-05 DIAGNOSIS — Z7189 Other specified counseling: Secondary | ICD-10-CM

## 2019-12-05 DIAGNOSIS — E860 Dehydration: Secondary | ICD-10-CM

## 2019-12-05 DIAGNOSIS — Z515 Encounter for palliative care: Secondary | ICD-10-CM | POA: Diagnosis not present

## 2019-12-05 DIAGNOSIS — I739 Peripheral vascular disease, unspecified: Secondary | ICD-10-CM | POA: Diagnosis present

## 2019-12-05 DIAGNOSIS — N1831 Chronic kidney disease, stage 3a: Secondary | ICD-10-CM | POA: Diagnosis present

## 2019-12-05 DIAGNOSIS — K3189 Other diseases of stomach and duodenum: Secondary | ICD-10-CM | POA: Diagnosis present

## 2019-12-05 DIAGNOSIS — Z8249 Family history of ischemic heart disease and other diseases of the circulatory system: Secondary | ICD-10-CM

## 2019-12-05 DIAGNOSIS — Z79899 Other long term (current) drug therapy: Secondary | ICD-10-CM

## 2019-12-05 DIAGNOSIS — I255 Ischemic cardiomyopathy: Secondary | ICD-10-CM | POA: Diagnosis present

## 2019-12-05 DIAGNOSIS — R571 Hypovolemic shock: Secondary | ICD-10-CM | POA: Diagnosis not present

## 2019-12-05 DIAGNOSIS — I4819 Other persistent atrial fibrillation: Secondary | ICD-10-CM | POA: Diagnosis present

## 2019-12-05 DIAGNOSIS — E669 Obesity, unspecified: Secondary | ICD-10-CM | POA: Diagnosis present

## 2019-12-05 DIAGNOSIS — R197 Diarrhea, unspecified: Secondary | ICD-10-CM

## 2019-12-05 DIAGNOSIS — Z882 Allergy status to sulfonamides status: Secondary | ICD-10-CM

## 2019-12-05 DIAGNOSIS — K921 Melena: Secondary | ICD-10-CM | POA: Diagnosis present

## 2019-12-05 DIAGNOSIS — R4182 Altered mental status, unspecified: Secondary | ICD-10-CM | POA: Diagnosis not present

## 2019-12-05 DIAGNOSIS — Z66 Do not resuscitate: Secondary | ICD-10-CM | POA: Diagnosis present

## 2019-12-05 DIAGNOSIS — I130 Hypertensive heart and chronic kidney disease with heart failure and stage 1 through stage 4 chronic kidney disease, or unspecified chronic kidney disease: Secondary | ICD-10-CM | POA: Diagnosis present

## 2019-12-05 DIAGNOSIS — Z7901 Long term (current) use of anticoagulants: Secondary | ICD-10-CM

## 2019-12-05 DIAGNOSIS — Z7951 Long term (current) use of inhaled steroids: Secondary | ICD-10-CM

## 2019-12-05 DIAGNOSIS — Z823 Family history of stroke: Secondary | ICD-10-CM

## 2019-12-05 DIAGNOSIS — Z6834 Body mass index (BMI) 34.0-34.9, adult: Secondary | ICD-10-CM | POA: Diagnosis not present

## 2019-12-05 DIAGNOSIS — Z20828 Contact with and (suspected) exposure to other viral communicable diseases: Secondary | ICD-10-CM | POA: Diagnosis present

## 2019-12-05 DIAGNOSIS — K449 Diaphragmatic hernia without obstruction or gangrene: Secondary | ICD-10-CM | POA: Diagnosis present

## 2019-12-05 DIAGNOSIS — D6959 Other secondary thrombocytopenia: Secondary | ICD-10-CM | POA: Diagnosis present

## 2019-12-05 DIAGNOSIS — N179 Acute kidney failure, unspecified: Secondary | ICD-10-CM

## 2019-12-05 DIAGNOSIS — Z8679 Personal history of other diseases of the circulatory system: Secondary | ICD-10-CM

## 2019-12-05 DIAGNOSIS — Z96652 Presence of left artificial knee joint: Secondary | ICD-10-CM | POA: Diagnosis present

## 2019-12-05 HISTORY — DX: Gastrointestinal hemorrhage, unspecified: K92.2

## 2019-12-05 LAB — CBC WITH DIFFERENTIAL/PLATELET
Abs Immature Granulocytes: 0.02 10*3/uL (ref 0.00–0.07)
Basophils Absolute: 0 10*3/uL (ref 0.0–0.1)
Basophils Relative: 1 %
Eosinophils Absolute: 0.3 10*3/uL (ref 0.0–0.5)
Eosinophils Relative: 5 %
HCT: 31.3 % — ABNORMAL LOW (ref 36.0–46.0)
Hemoglobin: 10.5 g/dL — ABNORMAL LOW (ref 12.0–15.0)
Immature Granulocytes: 0 %
Lymphocytes Relative: 17 %
Lymphs Abs: 1 10*3/uL (ref 0.7–4.0)
MCH: 30.1 pg (ref 26.0–34.0)
MCHC: 33.5 g/dL (ref 30.0–36.0)
MCV: 89.7 fL (ref 80.0–100.0)
Monocytes Absolute: 0.4 10*3/uL (ref 0.1–1.0)
Monocytes Relative: 8 %
Neutro Abs: 4.1 10*3/uL (ref 1.7–7.7)
Neutrophils Relative %: 69 %
Platelets: 80 10*3/uL — ABNORMAL LOW (ref 150–400)
RBC: 3.49 MIL/uL — ABNORMAL LOW (ref 3.87–5.11)
RDW: 18.6 % — ABNORMAL HIGH (ref 11.5–15.5)
WBC: 5.8 10*3/uL (ref 4.0–10.5)
nRBC: 0 % (ref 0.0–0.2)

## 2019-12-05 LAB — C DIFFICILE QUICK SCREEN W PCR REFLEX
C Diff antigen: NEGATIVE
C Diff toxin: NEGATIVE

## 2019-12-05 LAB — CBC
HCT: 30.2 % — ABNORMAL LOW (ref 36.0–46.0)
HCT: 30.8 % — ABNORMAL LOW (ref 36.0–46.0)
Hemoglobin: 9.8 g/dL — ABNORMAL LOW (ref 12.0–15.0)
Hemoglobin: 10.2 g/dL — ABNORMAL LOW (ref 12.0–15.0)
MCH: 30.8 pg (ref 26.0–34.0)
MCH: 30.9 pg (ref 26.0–34.0)
MCHC: 32.5 g/dL (ref 30.0–36.0)
MCHC: 33.1 g/dL (ref 30.0–36.0)
MCV: 95 fL (ref 80.0–100.0)
MCV: 93.3 fL (ref 80.0–100.0)
Platelets: 71 10*3/uL — ABNORMAL LOW (ref 150–400)
Platelets: 77 10*3/uL — ABNORMAL LOW (ref 150–400)
RBC: 3.18 MIL/uL — ABNORMAL LOW (ref 3.87–5.11)
RBC: 3.3 MIL/uL — ABNORMAL LOW (ref 3.87–5.11)
RDW: 18.4 % — ABNORMAL HIGH (ref 11.5–15.5)
RDW: 18.4 % — ABNORMAL HIGH (ref 11.5–15.5)
WBC: 6.6 10*3/uL (ref 4.0–10.5)
WBC: 5.6 10*3/uL (ref 4.0–10.5)
nRBC: 0 % (ref 0.0–0.2)
nRBC: 0 % (ref 0.0–0.2)

## 2019-12-05 LAB — AMMONIA: Ammonia: 28 umol/L (ref 9–35)

## 2019-12-05 LAB — COMPREHENSIVE METABOLIC PANEL
ALT: 35 U/L (ref 0–44)
AST: 200 U/L — ABNORMAL HIGH (ref 15–41)
Albumin: 2.9 g/dL — ABNORMAL LOW (ref 3.5–5.0)
Alkaline Phosphatase: 221 U/L — ABNORMAL HIGH (ref 38–126)
Anion gap: 13 (ref 5–15)
BUN: 38 mg/dL — ABNORMAL HIGH (ref 8–23)
CO2: 16 mmol/L — ABNORMAL LOW (ref 22–32)
Calcium: 8.7 mg/dL — ABNORMAL LOW (ref 8.9–10.3)
Chloride: 102 mmol/L (ref 98–111)
Creatinine, Ser: 3.52 mg/dL — ABNORMAL HIGH (ref 0.44–1.00)
GFR calc Af Amer: 13 mL/min — ABNORMAL LOW (ref >60)
GFR calc non Af Amer: 11 mL/min — ABNORMAL LOW (ref >60)
Glucose, Bld: 77 mg/dL (ref 70–99)
Potassium: 3.7 mmol/L (ref 3.5–5.1)
Sodium: 131 mmol/L — ABNORMAL LOW (ref 135–145)
Total Bilirubin: 2.5 mg/dL — ABNORMAL HIGH (ref 0.3–1.2)
Total Protein: 5.7 g/dL — ABNORMAL LOW (ref 6.5–8.1)

## 2019-12-05 LAB — BRAIN NATRIURETIC PEPTIDE: B Natriuretic Peptide: 2407 pg/mL — ABNORMAL HIGH (ref 0.0–100.0)

## 2019-12-05 LAB — APTT: aPTT: 48 seconds — ABNORMAL HIGH (ref 24–36)

## 2019-12-05 LAB — PROTIME-INR
INR: 2.3 — ABNORMAL HIGH (ref 0.8–1.2)
Prothrombin Time: 25 seconds — ABNORMAL HIGH (ref 11.4–15.2)

## 2019-12-05 LAB — TYPE AND SCREEN
ABO/RH(D): O POS
Antibody Screen: NEGATIVE

## 2019-12-05 LAB — SARS CORONAVIRUS 2 (TAT 6-24 HRS): SARS Coronavirus 2: NEGATIVE

## 2019-12-05 LAB — C DIFFICILE QUICK SCREEN W PCR REFLEX??: C Diff interpretation: NOT DETECTED

## 2019-12-05 MED ORDER — CARVEDILOL 25 MG PO TABS: 25.0000 mg | ORAL_TABLET | Freq: Two times a day (BID) | ORAL | Status: DC

## 2019-12-05 MED ORDER — ALBUTEROL SULFATE (2.5 MG/3ML) 0.083% IN NEBU: 2.5000 mg | INHALATION_SOLUTION | RESPIRATORY_TRACT | Status: DC | PRN

## 2019-12-05 MED ORDER — ONDANSETRON HCL 4 MG/2ML IJ SOLN: 4.0000 mg | Freq: Three times a day (TID) | INTRAMUSCULAR | Status: DC | PRN

## 2019-12-05 MED ORDER — SODIUM CHLORIDE 0.9 % IV SOLN
1000.0000 mL | Freq: Once | INTRAVENOUS | Status: AC
  Administered 2019-12-05: 1000 mL via INTRAVENOUS

## 2019-12-05 MED ORDER — PANTOPRAZOLE SODIUM 40 MG IV SOLR
40.0000 mg | INTRAVENOUS | Status: DC
  Administered 2019-12-05: 40 mg via INTRAVENOUS
  Filled 2019-12-05: qty 40

## 2019-12-05 MED ORDER — INFLUENZA VAC A&B SA ADJ QUAD 0.5 ML IM PRSY
0.5000 mL | PREFILLED_SYRINGE | INTRAMUSCULAR | Status: DC
  Filled 2019-12-05: qty 0.5

## 2019-12-05 MED ORDER — HYDRALAZINE HCL 25 MG PO TABS
25.0000 mg | ORAL_TABLET | Freq: Three times a day (TID) | ORAL | Status: DC | PRN
  Filled 2019-12-05: qty 1

## 2019-12-05 MED ORDER — VITAMIN D3 25 MCG (1000 UNIT) PO TABS
1000.0000 [IU] | ORAL_TABLET | Freq: Every day | ORAL | Status: DC
  Administered 2019-12-07 – 2019-12-09 (×3): 1000 [IU] via ORAL
  Filled 2019-12-05 (×7): qty 1

## 2019-12-05 MED ORDER — BUDESONIDE 0.5 MG/2ML IN SUSP: 0.5000 mg | Freq: Two times a day (BID) | RESPIRATORY_TRACT | Status: DC

## 2019-12-05 MED ORDER — ATORVASTATIN CALCIUM 20 MG PO TABS
20.0000 mg | ORAL_TABLET | Freq: Every day | ORAL | Status: DC
  Administered 2019-12-06 – 2019-12-08 (×3): 20 mg via ORAL
  Filled 2019-12-05 (×3): qty 1

## 2019-12-05 NOTE — ED Notes (Signed)
ED TO INPATIENT HANDOFF REPORT  ED Nurse Name and Phone #:  Tykeem Lanzer [phone]  S Name/Age/Gender [name] 85 y.o. female Room/Bed: ED38A/ED38A  Code Status   Code Status: DNR  Home/SNF/Other Home Patient oriented to: self and place Is this baseline? Yes   Triage Complete: Triage complete  Chief Complaint Bloody diarrhea [R19.7]  Triage Note Pt ems from home for loose dark stools x 2 weeks. Per ems family said that stools have become darks over 2 weeks. Pt states that she is having 2 to 3 stools per day and that abd has become distended over last week. Pt denies abd pain. Pts abd is firm around umbilicus. Pt states that she has a hernia in that area. Vss, NAD    Allergies Allergies  Allergen Reactions  . Sulfa Antibiotics Shortness Of Breath, Other (See Comments) and Palpitations    dizziness    Level of Care/Admitting Diagnosis ED Disposition    ED Disposition Condition [facility] Area: [employer] [[id number]]  Level of Care: Med-Surg [16]  Covid Evaluation: Asymptomatic Screening Protocol (No Symptoms)  Diagnosis: Bloody diarrhea [834196]  Admitting Physician: Ivor Costa [[id number]]  Attending Physician: Ivor Costa [[id number]]  Estimated length of stay: past midnight tomorrow  Certification:: I certify this patient will need inpatient services for at least 2 midnights       B Medical/Surgery History Past Medical History:  Diagnosis Date  . Arthritis    "maybe a little" (03/24/2017)  . Chronic diastolic CHF (congestive heart failure) ([place])    a. TTE 4/19: nl LVSF, nl RVSF, mod LVH, mild MR, trivial TR  . Dizziness   . Fall    a. fractured left hip 05/2018 s/p ORIF  . Hypertension   . PAD (peripheral artery disease) ([place])   . Persistent atrial fibrillation ([place])    a. diagnosed 03/2018; b. CHADS2VASc 8 (CHF, HTN, age x 2, stroke x 2, vascular disease, female); c. Eliquis   Past Surgical History:  Procedure Laterality Date  .  ABDOMINAL AORTIC ANEURYSM REPAIR     hx/notes 07/26/2012  . ABDOMINAL AORTOGRAM N/A 02/24/2017   Procedure: Abdominal Aortogram;  Surgeon: Wellington Hampshire, MD;  Location: [place] [facility];  Service: Cardiovascular;  Laterality: N/A;  . HERNIA REPAIR    . INTRAMEDULLARY (IM) NAIL INTERTROCHANTERIC Left 05/16/2018   Procedure: INTRAMEDULLARY (IM) NAIL INTERTROCHANTRIC;  Surgeon: Hessie Knows, MD;  Location: [facility];  Service: Orthopedics;  Laterality: Left;  . JOINT REPLACEMENT    . LAPAROSCOPIC INCISIONAL / UMBILICAL / VENTRAL HERNIA REPAIR  2010   [place] w/mesh hx/notes 07/26/2012  . LOWER EXTREMITY ANGIOGRAPHY Bilateral 02/24/2017   Procedure: Lower Extremity Angiography;  Surgeon: Wellington Hampshire, MD;  Location: [place] [facility];  Service: Cardiovascular;  Laterality: Bilateral;  . PERIPHERAL VASCULAR ATHERECTOMY  03/24/2017  . PERIPHERAL VASCULAR ATHERECTOMY Right 03/24/2017   Procedure: Peripheral Vascular Atherectomy;  Surgeon: Wellington Hampshire, MD;  Location: [place] [facility];  Service: Cardiovascular;  Laterality: Right;  . TOTAL KNEE ARTHROPLASTY Left      A IV Location/Drains/Wounds Patient Lines/Drains/Airways Status   Active Line/Drains/Airways    Name:   Placement date:   Placement time:   Site:   Days:   Peripheral IV 12/05/19 Left Forearm   12/05/19    0953    Forearm   less than 1   External Urinary Catheter   06/14/18    0235    --  539   Airway 7 mm   05/16/18    1356     568   Incision (Closed) 05/16/18 Hip Left   05/16/18    1349     568   Wound / Incision (Open or Dehisced) 06/28/16 Other (Comment) Leg Right;Lower;Medial wound consult in place   06/28/16    2250    Leg   1255          Intake/Output Last 24 hours No intake or output data in the 24 hours ending 12/05/19 2015  Labs/Imaging Results for orders placed or performed during the hospital encounter of 12/05/19 (from the past 48 hour(s))  CBC with Differential     Status: Abnormal   Collection Time:  12/05/19  9:53 AM  Result Value Ref Range   WBC 5.8 4.0 - 10.5 K/uL   RBC 3.49 (L) 3.87 - 5.11 MIL/uL   Hemoglobin 10.5 (L) 12.0 - 15.0 g/dL   HCT 57.8 (L) 46.9 - 62.9 %   MCV 89.7 80.0 - 100.0 fL   MCH 30.1 26.0 - 34.0 pg   MCHC 33.5 30.0 - 36.0 g/dL   RDW 52.8 (H) 41.3 - 24.4 %   Platelets 80 (L) 150 - 400 K/uL    Comment: Immature Platelet Fraction may be clinically indicated, consider ordering this additional test [id number]    nRBC 0.0 0.0 - 0.2 %   Neutrophils Relative % 69 %   Neutro Abs 4.1 1.7 - 7.7 K/uL   Lymphocytes Relative 17 %   Lymphs Abs 1.0 0.7 - 4.0 K/uL   Monocytes Relative 8 %   Monocytes Absolute 0.4 0.1 - 1.0 K/uL   Eosinophils Relative 5 %   Eosinophils Absolute 0.3 0.0 - 0.5 K/uL   Basophils Relative 1 %   Basophils Absolute 0.0 0.0 - 0.1 K/uL   Immature Granulocytes 0 %   Abs Immature Granulocytes 0.02 0.00 - 0.07 K/uL    Comment: Performed at [facility], [address] [place], [place] [address]  Comprehensive metabolic panel     Status: Abnormal   Collection Time: 12/05/19  9:53 AM  Result Value Ref Range   Sodium 131 (L) 135 - 145 mmol/L   Potassium 3.7 3.5 - 5.1 mmol/L   Chloride 102 98 - 111 mmol/L   CO2 16 (L) 22 - 32 mmol/L   Glucose, Bld 77 70 - 99 mg/dL   BUN 38 (H) 8 - 23 mg/dL   Creatinine, Ser 3.47 (H) 0.44 - 1.00 mg/dL   Calcium 8.7 (L) 8.9 - 10.3 mg/dL   Total Protein 5.7 (L) 6.5 - 8.1 g/dL   Albumin 2.9 (L) 3.5 - 5.0 g/dL   AST 425 (H) 15 - 41 U/L   ALT 35 0 - 44 U/L   Alkaline Phosphatase 221 (H) 38 - 126 U/L   Total Bilirubin 2.5 (H) 0.3 - 1.2 mg/dL   GFR calc non Af Amer 11 (L) >60 mL/min   GFR calc Af Amer 13 (L) >60 mL/min   Anion gap 13 5 - 15    Comment: Performed at [facility], [address] [place], [place] [address]  APTT     Status: Abnormal   Collection Time: 12/05/19  9:53 AM  Result Value Ref Range   aPTT 48 (H) 24 - 36 seconds    Comment:        IF BASELINE aPTT IS  ELEVATED, SUGGEST PATIENT RISK ASSESSMENT BE USED TO DETERMINE APPROPRIATE ANTICOAGULANT THERAPY.  Performed at [facility], [address] [place], [place] [address]   Protime-INR     Status: Abnormal   Collection Time: 12/05/19  9:53 AM  Result Value Ref Range   Prothrombin Time 25.0 (H) 11.4 - 15.2 seconds   INR 2.3 (H) 0.8 - 1.2    Comment: (NOTE) INR goal varies based on device and disease states. Performed at [facility], [address] [place], [place] [address]   Type and screen [facility]     Status: None   Collection Time: 12/05/19  9:53 AM  Result Value Ref Range   ABO/RH(D) O POS    Antibody Screen NEG    Sample Expiration      12/08/2019,2359 Performed at [facility], [address] [place], [place] [address]   BNP     Status: Abnormal   Collection Time: 12/05/19  9:53 AM  Result Value Ref Range   B Natriuretic Peptide 2,407.0 (H) 0.0 - 100.0 pg/mL    Comment: Performed at [facility], [address] [place], [place] [address]  C difficile quick scan w PCR reflex     Status: None   Collection Time: 12/05/19 10:14 AM   Specimen: Stool  Result Value Ref Range   C Diff antigen NEGATIVE NEGATIVE   C Diff toxin NEGATIVE NEGATIVE   C Diff interpretation No C. difficile detected.     Comment: Performed at [facility], [address] [place], [place] [address]  SARS CORONAVIRUS 2 (TAT 6-24 HRS) Nasopharyngeal Nasopharyngeal Swab     Status: None   Collection Time: 12/05/19 11:14 AM   Specimen: Nasopharyngeal Swab  Result Value Ref Range   SARS Coronavirus 2 NEGATIVE NEGATIVE    Comment: (NOTE) SARS-CoV-2 target nucleic acids are NOT DETECTED. The SARS-CoV-2 RNA is generally detectable in upper and lower respiratory specimens during the acute phase of infection. Negative results do not preclude SARS-CoV-2 infection, do not rule out co-infections with other pathogens, and should not be  used as the sole basis for treatment or other patient management decisions. Negative results must be combined with clinical observations, patient history, and epidemiological information. The expected result is Negative. Fact Sheet for Patients: HairSlick.nohttps://www.fda.gov/media/138098/download Fact Sheet for Healthcare Providers: quierodirigir.comhttps://www.fda.gov/media/138095/download This test is not yet approved or cleared by the [place] FDA and  has been authorized for detection and/or diagnosis of SARS-CoV-2 by FDA under an Emergency Use Authorization (EUA). This EUA will remain  in effect (meaning this test can be used) for the duration of the COVID-19 declaration under Section 56 4(b)(1) of the Act, 21 U.S.C. section 360bbb-3(b)(1), unless the authorization is terminated or revoked sooner. Performed at [facility], [address] [place], [place] [address]   CBC q6h     Status: Abnormal   Collection Time: 12/05/19  2:16 PM  Result Value Ref Range   WBC 6.6 4.0 - 10.5 K/uL   RBC 3.18 (L) 3.87 - 5.11 MIL/uL   Hemoglobin 9.8 (L) 12.0 - 15.0 g/dL   HCT 02.730.2 (L) 25.336.0 - 66.446.0 %   MCV 95.0 80.0 - 100.0 fL   MCH 30.8 26.0 - 34.0 pg   MCHC 32.5 30.0 - 36.0 g/dL   RDW 40.318.4 (H) 47.411.5 - 25.915.5 %   Platelets 71 (L) 150 - 400 K/uL    Comment: Immature Platelet Fraction may be clinically indicated, consider ordering this additional test [id number] CONSISTENT WITH PREVIOUS RESULT    nRBC 0.0  0.0 - 0.2 %    Comment: Performed at [facility], [address] [place], [place] [address]   No results found.  Pending Labs Unresulted Labs (From admission, onward)    Start     Ordered   12/06/19 0500  Basic metabolic panel  Tomorrow morning,   STAT     12/05/19 1342   12/05/19 2015  Ammonia  Once,   STAT     12/05/19 2014   12/05/19 1340  CBC q6h  Now then every 6 hours,   STAT     12/05/19 1339   12/05/19 1155  GI pathogen panel by PCR, stool  Once,   STAT     12/05/19 1155           Vitals/Pain Today's Vitals   12/05/19 1730 12/05/19 1749 12/05/19 1845 12/05/19 1930  BP: 104/64 (!) 91/53 (!) 103/45 (!) 103/45  Pulse: 68 68 65 66  Resp: 18 12 18 20   Temp:  97.6 F (36.4 C)    TempSrc:  Oral    SpO2: 99% 99%  99%  Weight:      Height:      PainSc:        Isolation Precautions No active isolations  Medications Medications  ondansetron (ZOFRAN) injection 4 mg (has no administration in time range)  albuterol (PROVENTIL) (2.5 MG/3ML) 0.083% nebulizer solution 2.5 mg (has no administration in time range)  atorvastatin (LIPITOR) tablet 20 mg (has no administration in time range)  carvedilol (COREG) tablet 25 mg (has no administration in time range)  cholecalciferol (VITAMIN D) tablet 1,000 Units (has no administration in time range)  budesonide (PULMICORT) nebulizer solution 0.5 mg (has no administration in time range)  pantoprazole (PROTONIX) injection 40 mg (40 mg Intravenous Given 12/05/19 1417)  hydrALAZINE (APRESOLINE) tablet 25 mg (has no administration in time range)  0.9 %  sodium chloride infusion (0 mLs Intravenous Stopped 12/05/19 1419)    Mobility walks with device Moderate fall risk   Focused Assessments n/a   R Recommendations: See Admitting Provider Note  Report given to:   Additional Notes: n/a

## 2019-12-05 NOTE — ED Triage Notes (Signed)
Pt ems from home for loose dark stools x 2 weeks. Per ems family said that stools have become darks over 2 weeks. Pt states that she is having 2 to 3 stools per day and that abd has become distended over last week. Pt denies abd pain. Pts abd is firm around umbilicus. Pt states that she has a hernia in that area. Vss, NAD

## 2019-12-05 NOTE — ED Provider Notes (Signed)
[facility] Emergency Department Provider Note   ____________________________________________    I have reviewed the triage vital signs and the nursing notes.   HISTORY  Chief Complaint GI Bleeding     HPI [name] is a 85 y.o. female with a history of atrial fibrillation on anticoagulation who presents with abnormal stools which has become dark and "sticky" over the last several weeks.  She reports he did take Pepto-Bismol yesterday but nothing prior to that.  Denies abdominal pain.  No fevers or chills.  No lightheadedness.  No nausea or vomiting.  No reports of recent antibiotics  Past Medical History:  Diagnosis Date  . Arthritis    "maybe a little" (03/24/2017)  . Chronic diastolic CHF (congestive heart failure) (HCC)    a. TTE 4/19: nl LVSF, nl RVSF, mod LVH, mild MR, trivial TR  . Dizziness   . Fall    a. fractured left hip 05/2018 s/p ORIF  . Hypertension   . PAD (peripheral artery disease) ([facility])   . Persistent atrial fibrillation ([facility])    a. diagnosed 03/2018; b. CHADS2VASc 8 (CHF, HTN, age x 2, stroke x 2, vascular disease, female); c. Eliquis    Patient Active Problem List   Diagnosis Date Noted  . Bloody diarrhea 12/05/2019  . Acute renal failure superimposed on stage 3a chronic kidney disease (HCC) 12/05/2019  . HLD (hyperlipidemia) 12/05/2019  . Liver cirrhosis (HCC) 12/05/2019  . Ischemic cardiomyopathy 06/17/2018  . Bronchitis 06/17/2018  . Chronic combined systolic (congestive) and diastolic (congestive) heart failure (HCC) 06/14/2018  . HTN (hypertension) 06/14/2018  . Closed left hip fracture (HCC) 05/15/2018  . Acute cystitis without hematuria 03/16/2018  . Atrial fibrillation with controlled ventricular response (HCC) 03/16/2018  . Weakness 03/16/2018  . Thrombocytopenia (HCC) 03/16/2018  . PAD (peripheral artery disease) (HCC)   . Critical lower limb ischemia 03/24/2017  . Sepsis (HCC) 06/28/2016    Past  Surgical History:  Procedure Laterality Date  . ABDOMINAL AORTIC ANEURYSM REPAIR     hx/notes 07/26/2012  . ABDOMINAL AORTOGRAM N/A 02/24/2017   Procedure: Abdominal Aortogram;  Surgeon: Iran Ouch, MD;  [facility];  Service: Cardiovascular;  Laterality: N/A;  . HERNIA REPAIR    . INTRAMEDULLARY (IM) NAIL INTERTROCHANTERIC Left 05/16/2018   Procedure: INTRAMEDULLARY (IM) NAIL INTERTROCHANTRIC;  Surgeon: Kennedy Bucker, MD;  Location: [facility];  Service: Orthopedics;  Laterality: Left;  . JOINT REPLACEMENT    . LAPAROSCOPIC INCISIONAL / UMBILICAL / VENTRAL HERNIA REPAIR  2010   [place] w/mesh hx/notes 07/26/2012  . LOWER EXTREMITY ANGIOGRAPHY Bilateral 02/24/2017   Procedure: Lower Extremity Angiography;  Surgeon: Iran Ouch, MD;  [facility]: [facility];  Service: Cardiovascular;  Laterality: Bilateral;  . PERIPHERAL VASCULAR ATHERECTOMY  03/24/2017  . PERIPHERAL VASCULAR ATHERECTOMY Right 03/24/2017   Procedure: Peripheral Vascular Atherectomy;  Surgeon: Iran Ouch, MD;  [facility];  Service: Cardiovascular;  Laterality: Right;  . TOTAL KNEE ARTHROPLASTY Left     Prior to Admission medications   Medication Sig Start Date End Date Taking? Authorizing Provider  acetaminophen (TYLENOL) 325 MG tablet Take 2 tablets (650 mg total) by mouth every 6 (six) hours as needed for mild pain or moderate pain (or Fever >/= 101). 06/17/18   Renae Gloss, Richard, MD  atorvastatin (LIPITOR) 20 MG tablet Take 1 tablet (20 mg total) by mouth daily at 6 PM. 03/25/17   Arty Baumgartner, NP  budesonide (PULMICORT)  0.5 MG/2ML nebulizer solution Take 2 mLs (0.5 mg total) by nebulization 2 (two) times daily. Patient taking differently: Take 0.5 mg by nebulization 2 (two) times daily as needed.  06/17/18   Loletha Grayer, MD  carvedilol (COREG) 25 MG tablet Take 1 tablet (25 mg total) by mouth 2 (two) times daily with a meal. 06/17/18   Loletha Grayer, MD  cholecalciferol  (VITAMIN D) 1000 units tablet Take 1,000 Units by mouth daily.     [provider]  ELIQUIS 5 MG TABS tablet Take 5 mg by mouth 2 (two) times daily. 05/04/18   [provider]  furosemide (LASIX) 20 MG tablet TAKE 1 TABLET(20 MG) BY MOUTH TWICE DAILY 08/10/19   Wellington Hampshire, MD  HYDROcodone-acetaminophen (NORCO/VICODIN) 5-325 MG tablet Take 1 tablet by mouth every 6 (six) hours as needed for severe pain. 06/17/18   Loletha Grayer, MD  ipratropium-albuterol (DUONEB) 0.5-2.5 (3) MG/3ML SOLN Take 3 mLs by nebulization every 6 (six) hours. 06/17/18   Loletha Grayer, MD  lisinopril (PRINIVIL,ZESTRIL) 2.5 MG tablet Take 1 tablet (2.5 mg total) by mouth daily. 06/18/18   Loletha Grayer, MD  potassium chloride SA (K-DUR,KLOR-CON) 20 MEQ tablet Take 1 tablet (20 mEq total) by mouth daily. 06/18/18   Loletha Grayer, MD  spironolactone (ALDACTONE) 25 MG tablet Take 0.5 tablets (12.5 mg total) by mouth daily. 06/18/18   Loletha Grayer, MD     Allergies Sulfa antibiotics  Family History  Problem Relation Age of Onset  . Hypertension Mother   . Heart attack Father   . Hypertension Sister   . Cancer Brother   . Stroke Brother     Social History Social History   Tobacco Use  . Smoking status: Never Smoker  . Smokeless tobacco: Never Used  Substance Use Topics  . Alcohol use: No    Alcohol/week: 0.0 standard drinks  . Drug use: No    Review of Systems  Constitutional: No fever/chills Eyes: No visual changes.  ENT: No sore throat. Cardiovascular: Denies chest pain. Respiratory: Denies shortness of breath. Gastrointestinal: As above Genitourinary: Negative for dysuria. Musculoskeletal: Negative for back pain. Skin: Negative for rash. Neurological: Negative for headaches   ____________________________________________   PHYSICAL EXAM:  VITAL SIGNS: ED Triage Vitals  Enc Vitals Group     BP 12/05/19 0923 (!) 115/58     Pulse Rate 12/05/19 0923 84     Resp  12/05/19 0923 14     Temp 12/05/19 0923 98.3 F (36.8 C)     Temp Source 12/05/19 0923 Oral     SpO2 12/05/19 0923 99 %     Weight 12/05/19 0925 72.6 kg (160 lb)     Height 12/05/19 0925 1.676 m (5\' 6" )     Head Circumference --      Peak Flow --      Pain Score 12/05/19 0924 0     Pain Loc --      Pain Edu? --      Excl. in [place]? --     Constitutional: Alert and oriented.   Nose: No congestion/rhinnorhea. Mouth/Throat: Mucous membranes are moist.    Cardiovascular: Normal rate, regular rhythm. Grossly normal heart sounds.  Good peripheral circulation. Respiratory: Normal respiratory effort.  No retractions. Lungs CTAB. Gastrointestinal: Hernia noted right lower quadrant, no tenderness, black mucus stool, somewhat concerning for C. difficile, mildly guaiac positive  Musculoskeletal:  Warm and well perfused Neurologic:  Normal speech and language. No gross focal neurologic deficits are appreciated.  Skin:  Skin is warm, dry and intact. No rash noted. Psychiatric: Mood and affect are normal. Speech and behavior are normal.  ____________________________________________   LABS (all labs ordered are listed, but only abnormal results are displayed)  Labs Reviewed  CBC WITH DIFFERENTIAL/PLATELET - Abnormal; Notable for the following components:      Result Value   RBC 3.49 (*)    Hemoglobin 10.5 (*)    HCT 31.3 (*)    RDW 18.6 (*)    Platelets 80 (*)    All other components within normal limits  COMPREHENSIVE METABOLIC PANEL - Abnormal; Notable for the following components:   Sodium 131 (*)    CO2 16 (*)    BUN 38 (*)    Creatinine, Ser 3.52 (*)    Calcium 8.7 (*)    Total Protein 5.7 (*)    Albumin 2.9 (*)    AST 200 (*)    Alkaline Phosphatase 221 (*)    Total Bilirubin 2.5 (*)    GFR calc non Af Amer 11 (*)    GFR calc Af Amer 13 (*)    All other components within normal limits  APTT - Abnormal; Notable for the following components:   aPTT 48 (*)    All other  components within normal limits  PROTIME-INR - Abnormal; Notable for the following components:   Prothrombin Time 25.0 (*)    INR 2.3 (*)    All other components within normal limits  C DIFFICILE QUICK SCREEN W PCR REFLEX  SARS CORONAVIRUS 2 (TAT 6-24 HRS)  GI PATHOGEN PANEL BY PCR, STOOL  TYPE AND SCREEN   ____________________________________________  EKG  ED ECG REPORT I, Jene Everyobert Shaneal Barasch, the attending physician, personally viewed and interpreted this ECG.  Date: 12/05/2019  Rhythm: normal sinus rhythm QRS Axis: normal Intervals: Abnormal ST/T Wave abnormalities: normal Narrative Interpretation: no evidence of acute ischemia  ____________________________________________  RADIOLOGY   ____________________________________________   PROCEDURES  Procedure(s) performed: No  Procedures   Critical Care performed: No ____________________________________________   INITIAL IMPRESSION / ASSESSMENT AND PLAN / ED COURSE  Pertinent labs & imaging results that were available during my care of the patient were reviewed by me and considered in my medical decision making (see chart for details).  Patient presents with loose stools, dark stools.  On exam significant mucus does not appear melanotic.  Mildly guaiac positive.  Concerning for C. difficile, sending GI panel and C. difficile PCR.  Pending labs, vitals and exam overall reassuring  Elevated BUN/creatinine consistent with acute kidney injury, will require admission for this, IV fluids infusing  C. difficile panel negative, GI panel pending, discussed with hospitalist for admission      ____________________________________________   FINAL CLINICAL IMPRESSION(S) / ED DIAGNOSES  Final diagnoses:  Acute kidney injury (HCC)  Dehydration  Diarrhea, unspecified type        Note:  This document was prepared using Dragon voice recognition software and may include unintentional dictation errors.   Jene EveryKinner,  Trenee Igoe, MD 12/05/19 1239

## 2019-12-05 NOTE — ED Notes (Signed)
Pt given the phone to speak with daughter.

## 2019-12-05 NOTE — H&P (Signed)
History and Physical    [name] HQI:[id number] DOB: June 10, 1931 DOA: 12/05/2019  Referring MD/NP/PA:   PCP: Guy Begin, MD   Patient coming from:  The patient is coming from home.  At baseline, pt is independent for most of ADL.        Chief Complaint: dark bloody diarreha  HPI: [name] is a 85 y.o. female with medical history significant of hypertension, hyperlipidemia, atrial fibrillation on Eliquis, PAD, CHF with EF of 40-45%, AAA repair, hernia repair, CKD stage III, thrombocytopenia, liver cirrhosis, who presents with dark bloody diarrhea.   Patient is a poor historian, but reports that he has been having diarrhea with dark bloody stool in the past 2 weeks intermittently. I also called her daughter and son to have obtained some history. She does not have abdominal pain.  She has nausea, no vomiting.  She denies chest pain, shortness of breath, cough.  No fever or chills.  She has a bilateral leg edema.  No unilateral numbness or tingling his extremities. She has abdominal bulging which she attributed to hx of hernia repair.  ED Course: pt was found to have WBC 5.8, BNP 2407, pending COVID-19 PCR, INR 2.3, positive FOBT, worsening renal function, abnormal liver function (ALP 221, AST 200, ALT 35, total bilirubin 2.5), temperature normal, blood pressure 113/49, heart rate 71, RR 10, oxygen saturation 99-100% on room air.  Patient is admitted to MedSurg bed as inpatient.  Review of Systems:   General: no fevers, chills, no body weight gain, has fatigue HEENT: no blurry vision, hearing changes or sore throat Respiratory: no dyspnea, coughing, wheezing CV: no chest pain, no palpitations GI: has nausea, diarrhea with dark stool, no abdominal pain, constipation GU: no dysuria, burning on urination, increased urinary frequency, hematuria  Ext: has leg edema Neuro: no unilateral weakness, numbness, or tingling, no vision change or hearing loss Skin: no rash, no skin  tear. MSK: No muscle spasm, no deformity, no limitation of range of movement in spin Heme: No easy bruising.  Travel history: No recent long distant travel.  Allergy:  Allergies  Allergen Reactions  . Sulfa Antibiotics Shortness Of Breath, Other (See Comments) and Palpitations    dizziness    Past Medical History:  Diagnosis Date  . Arthritis    "maybe a little" (03/24/2017)  . Chronic diastolic CHF (congestive heart failure) (HCC)    a. TTE 4/19: nl LVSF, nl RVSF, mod LVH, mild MR, trivial TR  . Dizziness   . Fall    a. fractured left hip 05/2018 s/p ORIF  . Hypertension   . PAD (peripheral artery disease) (HCC)   . Persistent atrial fibrillation (HCC)    a. diagnosed 03/2018; b. CHADS2VASc 8 (CHF, HTN, age x 2, stroke x 2, vascular disease, female); c. Eliquis    Past Surgical History:  Procedure Laterality Date  . ABDOMINAL AORTIC ANEURYSM REPAIR     hx/notes 07/26/2012  . ABDOMINAL AORTOGRAM N/A 02/24/2017   Procedure: Abdominal Aortogram;  Surgeon: Iran Ouch, MD;  [facility];  Service: Cardiovascular;  Laterality: N/A;  . HERNIA REPAIR    . INTRAMEDULLARY (IM) NAIL INTERTROCHANTERIC Left 05/16/2018   Procedure: INTRAMEDULLARY (IM) NAIL INTERTROCHANTRIC;  Surgeon: Kennedy Bucker, MD;  Location: [facility] ORS;  Service: Orthopedics;  Laterality: Left;  . JOINT REPLACEMENT    . LAPAROSCOPIC INCISIONAL / UMBILICAL / VENTRAL HERNIA REPAIR  2010   [facility] w/mesh hx/notes 07/26/2012  . LOWER EXTREMITY ANGIOGRAPHY Bilateral 02/24/2017  Procedure: Lower Extremity Angiography;  Surgeon: Iran Ouch, MD;  Location: [facility];  Service: Cardiovascular;  Laterality: Bilateral;  . PERIPHERAL VASCULAR ATHERECTOMY  03/24/2017  . PERIPHERAL VASCULAR ATHERECTOMY Right 03/24/2017   Procedure: Peripheral Vascular Atherectomy;  Surgeon: Iran Ouch, MD;  Location: [facility];  Service: Cardiovascular;  Laterality: Right;  . TOTAL KNEE ARTHROPLASTY Left      Social History:  reports that she has never smoked. She has never used smokeless tobacco. She reports that she does not drink alcohol or use drugs.  Family History:  Family History  Problem Relation Age of Onset  . Hypertension Mother   . Heart attack Father   . Hypertension Sister   . Cancer Brother   . Stroke Brother      Prior to Admission medications   Medication Sig Start Date End Date Taking? Authorizing Provider  acetaminophen (TYLENOL) 325 MG tablet Take 2 tablets (650 mg total) by mouth every 6 (six) hours as needed for mild pain or moderate pain (or Fever >/= 101). 06/17/18   Wieting, Richard, MD  atorvastatin (LIPITOR) 20 MG tablet Take 1 tablet (20 mg total) by mouth daily at 6 PM. 03/25/17   Arty Baumgartner, NP  budesonide (PULMICORT) 0.5 MG/2ML nebulizer solution Take 2 mLs (0.5 mg total) by nebulization 2 (two) times daily. Patient taking differently: Take 0.5 mg by nebulization 2 (two) times daily as needed.  06/17/18   Alford Highland, MD  carvedilol (COREG) 25 MG tablet Take 1 tablet (25 mg total) by mouth 2 (two) times daily with a meal. 06/17/18   Alford Highland, MD  cholecalciferol (VITAMIN D) 1000 units tablet Take 1,000 Units by mouth daily.     [provider]  ELIQUIS 5 MG TABS tablet Take 5 mg by mouth 2 (two) times daily. 05/04/18   [provider]  furosemide (LASIX) 20 MG tablet TAKE 1 TABLET(20 MG) BY MOUTH TWICE DAILY 08/10/19   Iran Ouch, MD  HYDROcodone-acetaminophen (NORCO/VICODIN) 5-325 MG tablet Take 1 tablet by mouth every 6 (six) hours as needed for severe pain. 06/17/18   Alford Highland, MD  ipratropium-albuterol (DUONEB) 0.5-2.5 (3) MG/3ML SOLN Take 3 mLs by nebulization every 6 (six) hours. 06/17/18   Alford Highland, MD  lisinopril (PRINIVIL,ZESTRIL) 2.5 MG tablet Take 1 tablet (2.5 mg total) by mouth daily. 06/18/18   Alford Highland, MD  potassium chloride SA (K-DUR,KLOR-CON) 20 MEQ tablet Take 1 tablet (20 mEq total) by  mouth daily. 06/18/18   Alford Highland, MD  spironolactone (ALDACTONE) 25 MG tablet Take 0.5 tablets (12.5 mg total) by mouth daily. 06/18/18   Alford Highland, MD    Physical Exam: Vitals:   12/05/19 1300 12/05/19 1330 12/05/19 1400 12/05/19 1430  BP: (!) 112/50 (!) 113/51 (!) 116/54 (!) 113/53  Pulse: (!) 59 68 69 60  Resp:   16 18  Temp:      TempSrc:      SpO2: 100% 100% 100% 100%  Weight:      Height:       General: Not in acute distress HEENT:       Eyes: PERRL, EOMI, no scleral icterus.       ENT: No discharge from the ears and nose, no pharynx injection, no tonsillar enlargement.        Neck: No JVD, no bruit, no mass felt. Heme: No neck lymph node enlargement. Cardiac: S1/S2, RRR, No murmurs, No gallops or rubs. Respiratory: No rales,  wheezing, rhonchi or rubs. GI: Soft, nondistended, nontender, no rebound pain, no organomegaly, BS present. Has abdominal bulging on the right side without tenderness. GU: No hematuria Ext: 2+ pitting leg edema bilaterally. 2+DP/PT pulse bilaterally. Musculoskeletal: No joint deformities, No joint redness or warmth, no limitation of ROM in spin. Skin: No rashes.  Neuro: Alert, oriented X3, cranial nerves II-XII grossly intact, moves all extremities. Psych: Patient is not psychotic, no suicidal or hemocidal ideation.  Labs on Admission: I have personally reviewed following labs and imaging studies  CBC: Recent Labs  Lab 12/05/19 0953 12/05/19 1416  WBC 5.8 6.6  NEUTROABS 4.1  --   HGB 10.5* 9.8*  HCT 31.3* 30.2*  MCV 89.7 95.0  PLT 80* 71*   Basic Metabolic Panel: Recent Labs  Lab 12/05/19 0953  NA 131*  K 3.7  CL 102  CO2 16*  GLUCOSE 77  BUN 38*  CREATININE 3.52*  CALCIUM 8.7*   GFR: Estimated Creatinine Clearance: 11.7 mL/min (A) (by C-G formula based on SCr of 3.52 mg/dL (H)). Liver Function Tests: Recent Labs  Lab 12/05/19 0953  AST 200*  ALT 35  ALKPHOS 221*  BILITOT 2.5*  PROT 5.7*  ALBUMIN 2.9*   No  results for input(s): LIPASE, AMYLASE in the last 168 hours. No results for input(s): AMMONIA in the last 168 hours. Coagulation Profile: Recent Labs  Lab 12/05/19 0953  INR 2.3*   Cardiac Enzymes: No results for input(s): CKTOTAL, CKMB, CKMBINDEX, TROPONINI in the last 168 hours. BNP (last 3 results) No results for input(s): PROBNP in the last 8760 hours. HbA1C: No results for input(s): HGBA1C in the last 72 hours. CBG: No results for input(s): GLUCAP in the last 168 hours. Lipid Profile: No results for input(s): CHOL, HDL, LDLCALC, TRIG, CHOLHDL, LDLDIRECT in the last 72 hours. Thyroid Function Tests: No results for input(s): TSH, T4TOTAL, FREET4, T3FREE, THYROIDAB in the last 72 hours. Anemia Panel: No results for input(s): VITAMINB12, FOLATE, FERRITIN, TIBC, IRON, RETICCTPCT in the last 72 hours. Urine analysis:    Component Value Date/Time   COLORURINE YELLOW (A) 05/15/2018 1714   APPEARANCEUR CLEAR (A) 05/15/2018 1714   APPEARANCEUR CLEAR 10/16/2014 0858   LABSPEC 1.015 05/15/2018 1714   LABSPEC 1.025 10/16/2014 0858   PHURINE 6.0 05/15/2018 1714   GLUCOSEU NEGATIVE 05/15/2018 1714   GLUCOSEU NEGATIVE 10/16/2014 0858   HGBUR MODERATE (A) 05/15/2018 1714   BILIRUBINUR NEGATIVE 05/15/2018 1714   BILIRUBINUR NEGATIVE 10/16/2014 0858   KETONESUR NEGATIVE 05/15/2018 1714   PROTEINUR NEGATIVE 05/15/2018 1714   NITRITE NEGATIVE 05/15/2018 1714   LEUKOCYTESUR NEGATIVE 05/15/2018 1714   LEUKOCYTESUR NEGATIVE 10/16/2014 0858   Sepsis Labs: @LABRCNTIP (procalcitonin:4,lacticidven:4) ) Recent Results (from the past 240 hour(s))  C difficile quick scan w PCR reflex     Status: None   Collection Time: 12/05/19 10:14 AM   Specimen: Stool  Result Value Ref Range Status   C Diff antigen NEGATIVE NEGATIVE Final   C Diff toxin NEGATIVE NEGATIVE Final   C Diff interpretation No C. difficile detected.  Final    Comment: Performed at [facility], [address],  [place], [place] [address]     Radiological Exams on Admission: No results found.   EKG: Independently reviewed.  Sinus rhythm, QTC 468, low voltage, T wave inversion in the inferior leads, poor R wave progression, anteroseptal infarction pattern  Assessment/Plan Principal Problem:   Bloody diarrhea Active Problems:   Chronic combined systolic (congestive) and diastolic (congestive) heart failure (HCC)  HTN (hypertension)   Atrial fibrillation with controlled ventricular response (HCC)   Thrombocytopenia (HCC)   Acute renal failure superimposed on stage 3a chronic kidney disease (HCC)   HLD (hyperlipidemia)   Liver cirrhosis (HCC)   Anemia due to blood loss   Bloody diarrhea and anemia due to blood loss: no EGD or colonoscopy on record.  Etiology is not clear.  Hemoglobin dropped from 11.2 -->10.5 -->9.8. Dr. Servando Snare of GI was consulted. - will admit to tele bed as inpt  - will will admit to med-surg bed as inpt - GI consulted by Ed, will follow up recommendations - NPO  - IVF: 1L NS bolus in ED -->will hold off IVF since pt has risk of CHF exacerbation - Start IV pantoprazole 40 mg bid - Zofran IV for nausea - Avoid NSAIDs and SQ heparin - Maintain IV access (2 large bore IVs if possible). - Monitor closely and follow q6h cbc, transfuse as necessary, if Hgb<7.0 - LaB: INR, PTT and type screen  Chronic combined systolic (congestive) and diastolic (congestive) heart failure (HCC): 2D echo on 09/02/2018 showed EF of 40-45% with grade 1 diastolic dysfunction.  Patient has 2+ leg edema, elevated BNP 2407.  Although patient does not have worsening shortness of breath, she is at high risk of developing CHF exacerbation. -Diuretics is on hold (Lasix and spironolactone) due to worsening renal function. -Hold off for further IV fluid  HTN:  -Continue home medications: Coreg -Hold lisinopril and diuretics -hydralazine prn  Atrial fibrillation with controlled ventricular response  (HCC): -hold Eliquis -continue coreg  Thrombocytopenia (HCC): This is chronic issue.  Likely due to liver cirrhosis.  Platelets 80 -Follow-up with CBC  Acute renal failure superimposed on stage 3a chronic kidney disease (HCC): Baseline Cre is <1.0, pt's Cre is 3.52 and BUN 38 on admission.  - IVF: Received 1 L normal saline in ED - Follow up renal function by BMP - Avoid using renal toxic medications, hypotension and contrast dye (or carefully use) - Hold lisinopril, diuretics  HLD (hyperlipidemia): -lipitor  Liver cirrhosis (HCC): ALP 221, AST 200, ALT 35, total bilirubin 2.5.  Mental status okay. -check ammonia level     Inpatient status:  # Patient requires inpatient status due to high intensity of service, high risk for further deterioration and high frequency of surveillance required.  I certify that at the point of admission it is my clinical judgment that the patient will require inpatient hospital care spanning beyond 2 midnights from the point of admission.  . This patient has multiple chronic comorbidities including hypertension, hyperlipidemia, atrial fibrillation on Eliquis, PAD, CHF with EF of 40-45%, AAA repair, hernia repair, CKD stage III, thrombocytopenia, liver cirrhosis . Now patient has presenting with bloody diarrhea. . The worrisome physical exam findings include 2+ bilateral leg edema . The initial radiographic and laboratory data are worrisome because of decreased hemoglobin, elevated BNP, positive FOBT, worsening renal function, abnormal liver function . Current medical needs: please see my assessment and plan . Predictability of an adverse outcome (risk): Patient has multiple complicated medical history, now presents with bloody diarrhea, also has abnormal liver function and worsening renal function.  Her presentation is highly complicated.  Patient is at high risk for deteriorating.  Will need to be treated in hospital for at least 2 days.      DVT ppx:  SCD Code Status: DNR per her daughter and son (per her son and daughter, pt would not want to be resuscitated) Family Communication: I  called her daughter and son by phone Disposition Plan:  Anticipate discharge back to previous home environment Consults called:  Dr. Servando SnareWohl Admission status: Med-surg bed as inpt       Date of Service 12/05/2019    Lorretta HarpXilin Mishka Stegemann [facility]   If 7PM-7AM, please contact night-coverage [email or web address] Password [email or web address] 12/05/2019, 4:08 PM

## 2019-12-06 ENCOUNTER — Inpatient Hospital Stay: Payer: Medicare Other | Admitting: Anesthesiology

## 2019-12-06 ENCOUNTER — Encounter
Admission: EM | Disposition: A | Discharge status: Hospice | Payer: Self-pay | Source: Home / Self Care | Attending: Internal Medicine

## 2019-12-06 ENCOUNTER — Encounter: Payer: Self-pay | Admitting: Internal Medicine

## 2019-12-06 DIAGNOSIS — Z66 Do not resuscitate: Secondary | ICD-10-CM | POA: Diagnosis present

## 2019-12-06 DIAGNOSIS — K921 Melena: Principal | ICD-10-CM

## 2019-12-06 DIAGNOSIS — K766 Portal hypertension: Secondary | ICD-10-CM

## 2019-12-06 HISTORY — PX: ESOPHAGOGASTRODUODENOSCOPY (EGD) WITH PROPOFOL: SHX5813

## 2019-12-06 LAB — CBC
HCT: 27.4 % — ABNORMAL LOW (ref 36.0–46.0)
HCT: 26.6 % — ABNORMAL LOW (ref 36.0–46.0)
HCT: 28.6 % — ABNORMAL LOW (ref 36.0–46.0)
HCT: 27.8 % — ABNORMAL LOW (ref 36.0–46.0)
Hemoglobin: 9.3 g/dL — ABNORMAL LOW (ref 12.0–15.0)
Hemoglobin: 9.5 g/dL — ABNORMAL LOW (ref 12.0–15.0)
Hemoglobin: 9.8 g/dL — ABNORMAL LOW (ref 12.0–15.0)
Hemoglobin: 9.1 g/dL — ABNORMAL LOW (ref 12.0–15.0)
MCH: 30.4 pg (ref 26.0–34.0)
MCH: 30.4 pg (ref 26.0–34.0)
MCH: 30.3 pg (ref 26.0–34.0)
MCH: 30.6 pg (ref 26.0–34.0)
MCHC: 33.9 g/dL (ref 30.0–36.0)
MCHC: 35.7 g/dL (ref 30.0–36.0)
MCHC: 34.3 g/dL (ref 30.0–36.0)
MCHC: 32.7 g/dL (ref 30.0–36.0)
MCV: 89.5 fL (ref 80.0–100.0)
MCV: 85 fL (ref 80.0–100.0)
MCV: 88.5 fL (ref 80.0–100.0)
MCV: 93.6 fL (ref 80.0–100.0)
Platelets: UNDETERMINED 10*3/uL (ref 150–400)
Platelets: 74 10*3/uL — ABNORMAL LOW (ref 150–400)
Platelets: 71 10*3/uL — ABNORMAL LOW (ref 150–400)
Platelets: 73 10*3/uL — ABNORMAL LOW (ref 150–400)
RBC: 3.06 MIL/uL — ABNORMAL LOW (ref 3.87–5.11)
RBC: 3.13 MIL/uL — ABNORMAL LOW (ref 3.87–5.11)
RBC: 3.23 MIL/uL — ABNORMAL LOW (ref 3.87–5.11)
RBC: 2.97 MIL/uL — ABNORMAL LOW (ref 3.87–5.11)
RDW: 18.1 % — ABNORMAL HIGH (ref 11.5–15.5)
RDW: 18.3 % — ABNORMAL HIGH (ref 11.5–15.5)
RDW: 18.6 % — ABNORMAL HIGH (ref 11.5–15.5)
RDW: 18.5 % — ABNORMAL HIGH (ref 11.5–15.5)
WBC: 5.7 10*3/uL (ref 4.0–10.5)
WBC: 5.3 10*3/uL (ref 4.0–10.5)
WBC: 5.4 10*3/uL (ref 4.0–10.5)
WBC: 5.7 10*3/uL (ref 4.0–10.5)
nRBC: 0 % (ref 0.0–0.2)
nRBC: 0 % (ref 0.0–0.2)
nRBC: 0 % (ref 0.0–0.2)
nRBC: 0 % (ref 0.0–0.2)

## 2019-12-06 LAB — GLUCOSE, CAPILLARY
Glucose-Capillary: 51 mg/dL — ABNORMAL LOW (ref 70–99)
Glucose-Capillary: 105 mg/dL — ABNORMAL HIGH (ref 70–99)

## 2019-12-06 LAB — BASIC METABOLIC PANEL
Anion gap: 9 (ref 5–15)
BUN: 42 mg/dL — ABNORMAL HIGH (ref 8–23)
CO2: 17 mmol/L — ABNORMAL LOW (ref 22–32)
Calcium: 8.2 mg/dL — ABNORMAL LOW (ref 8.9–10.3)
Chloride: 106 mmol/L (ref 98–111)
Creatinine, Ser: 3.5 mg/dL — ABNORMAL HIGH (ref 0.44–1.00)
GFR calc Af Amer: 13 mL/min — ABNORMAL LOW (ref >60)
GFR calc non Af Amer: 11 mL/min — ABNORMAL LOW (ref >60)
Glucose, Bld: 62 mg/dL — ABNORMAL LOW (ref 70–99)
Potassium: 4.1 mmol/L (ref 3.5–5.1)
Sodium: 132 mmol/L — ABNORMAL LOW (ref 135–145)

## 2019-12-06 LAB — FERRITIN: Ferritin: 187 ng/mL (ref 11–307)

## 2019-12-06 LAB — IRON: Iron: 87 ug/dL (ref 28–170)

## 2019-12-06 SURGERY — ESOPHAGOGASTRODUODENOSCOPY (EGD) WITH PROPOFOL
Anesthesia: General

## 2019-12-06 MED ORDER — SODIUM CHLORIDE 0.9 % IV BOLUS
250.0000 mL | Freq: Once | INTRAVENOUS | Status: AC
  Administered 2019-12-06: 250 mL via INTRAVENOUS

## 2019-12-06 MED ORDER — ETOMIDATE 2 MG/ML IV SOLN
INTRAVENOUS | Status: DC | PRN
  Administered 2019-12-06 (×2): 4 mg via INTRAVENOUS
  Administered 2019-12-06: 2 mg via INTRAVENOUS

## 2019-12-06 MED ORDER — SODIUM CHLORIDE 0.9 % IV SOLN: INTRAVENOUS | Status: DC

## 2019-12-06 MED ORDER — EPHEDRINE SULFATE 50 MG/ML IJ SOLN
INTRAMUSCULAR | Status: DC | PRN
  Administered 2019-12-06: 10 mg via INTRAVENOUS

## 2019-12-06 MED ORDER — EPHEDRINE SULFATE 50 MG/ML IJ SOLN
INTRAMUSCULAR | Status: AC
  Filled 2019-12-06: qty 1

## 2019-12-06 MED ORDER — PHENYLEPHRINE HCL (PRESSORS) 10 MG/ML IV SOLN
INTRAVENOUS | Status: DC | PRN
  Administered 2019-12-06: 100 ug via INTRAVENOUS

## 2019-12-06 MED ORDER — PANTOPRAZOLE SODIUM 40 MG PO TBEC
40.0000 mg | DELAYED_RELEASE_TABLET | Freq: Two times a day (BID) | ORAL | Status: DC
  Administered 2019-12-06 – 2019-12-09 (×6): 40 mg via ORAL
  Filled 2019-12-06 (×6): qty 1

## 2019-12-06 MED ORDER — DEXTROSE 50 % IV SOLN
25.0000 g | INTRAVENOUS | Status: AC
  Administered 2019-12-06: 25 g via INTRAVENOUS
  Filled 2019-12-06: qty 50

## 2019-12-06 MED ORDER — BUDESONIDE 0.5 MG/2ML IN SUSP: 0.5000 mg | Freq: Two times a day (BID) | RESPIRATORY_TRACT | Status: DC | PRN

## 2019-12-06 MED ORDER — PROPOFOL 10 MG/ML IV BOLUS
INTRAVENOUS | Status: AC
  Filled 2019-12-06: qty 20

## 2019-12-06 MED ORDER — ETOMIDATE 2 MG/ML IV SOLN
INTRAVENOUS | Status: AC
  Filled 2019-12-06: qty 10

## 2019-12-06 MED ORDER — ALBUMIN HUMAN 25 % IV SOLN
12.5000 g | Freq: Once | INTRAVENOUS | Status: AC
  Administered 2019-12-06: 15:00:00 12.5 g via INTRAVENOUS
  Filled 2019-12-06: qty 50

## 2019-12-06 NOTE — Consult Note (Signed)
[name], MD [facility]  [address], [place], [place] [address] Phone: [phone] Fax : [phone]  Consultation  Referring Provider:     Dr.Niu Primary Care Physician:  Christoper Fabian, MD Primary Gastroenterologist: Althia Forts         Reason for Consultation:     GI bleed  Date of Admission:  12/05/2019 Date of Consultation:  12/06/2019         HPI:   [name] is a 85 y.o. female who came in with a history of being on anticoagulation with dark sticky stools over the last few weeks.  She had been taking some Pepto-Bismol for the diarrhea.  The patient is not a good historian and I spoke to the daughter who gave me some of the patient's history.  The patient was admitted with a hemoglobin of 10.5 that was rechecked and was 9.8 and shortly after that was checked again at 10.2.  This morning the hemoglobin is 9.5.  She has not had any further signs of any bleeding.  She does have a history of cirrhosis from nonalcoholic source with a history of thrombocytopenia and the patient's platelets now are in the 70s to 80 range.  Attempt yesterday showed an AST of 200 with an ALT of 35 and a total bilirubin of 2.5.  The patient's alk phos is also elevated to 221.  Past Medical History:  Diagnosis Date  . Arthritis    "maybe a little" (03/24/2017)  . Chronic diastolic CHF (congestive heart failure) ([place])    a. TTE 4/19: nl LVSF, nl RVSF, mod LVH, mild MR, trivial TR  . Dizziness   . Fall    a. fractured left hip 05/2018 s/p ORIF  . Hypertension   . PAD (peripheral artery disease) ([place])   . Persistent atrial fibrillation ([place])    a. diagnosed 03/2018; b. CHADS2VASc 8 (CHF, HTN, age x 2, stroke x 2, vascular disease, female); c. Eliquis    Past Surgical History:  Procedure Laterality Date  . ABDOMINAL AORTIC ANEURYSM REPAIR     hx/notes 07/26/2012  . ABDOMINAL AORTOGRAM N/A 02/24/2017   Procedure: Abdominal Aortogram;  Surgeon: Wellington Hampshire, MD;  Location: [place] CV  LAB;  Service: Cardiovascular;  Laterality: N/A;  . HERNIA REPAIR    . INTRAMEDULLARY (IM) NAIL INTERTROCHANTERIC Left 05/16/2018   Procedure: INTRAMEDULLARY (IM) NAIL INTERTROCHANTRIC;  Surgeon: Hessie Knows, MD;  Location: [facility] ORS;  Service: Orthopedics;  Laterality: Left;  . JOINT REPLACEMENT    . LAPAROSCOPIC INCISIONAL / UMBILICAL / VENTRAL HERNIA REPAIR  2010   [place] w/mesh hx/notes 07/26/2012  . LOWER EXTREMITY ANGIOGRAPHY Bilateral 02/24/2017   Procedure: Lower Extremity Angiography;  Surgeon: Wellington Hampshire, MD;  Location: [place] [facility];  Service: Cardiovascular;  Laterality: Bilateral;  . PERIPHERAL VASCULAR ATHERECTOMY  03/24/2017  . PERIPHERAL VASCULAR ATHERECTOMY Right 03/24/2017   Procedure: Peripheral Vascular Atherectomy;  Surgeon: Wellington Hampshire, MD;  Location: [place] [facility];  Service: Cardiovascular;  Laterality: Right;  . TOTAL KNEE ARTHROPLASTY Left     Prior to Admission medications   Medication Sig Start Date End Date Taking? Authorizing Provider  acetaminophen (TYLENOL) 325 MG tablet Take 2 tablets (650 mg total) by mouth every 6 (six) hours as needed for mild pain or moderate pain (or Fever >/= 101). 06/17/18  Yes Wieting, Richard, MD  atorvastatin (LIPITOR) 20 MG tablet Take 1 tablet (20 mg total) by mouth daily at 6 PM. 03/25/17  Yes  Reino Bellis B, NP  budesonide (PULMICORT) 0.5 MG/2ML nebulizer solution Take 2 mLs (0.5 mg total) by nebulization 2 (two) times daily. Patient taking differently: Take 0.5 mg by nebulization 2 (two) times daily as needed.  06/17/18  Yes Loletha Grayer, MD  carvedilol (COREG) 25 MG tablet Take 1 tablet (25 mg total) by mouth 2 (two) times daily with a meal. 06/17/18  Yes Wieting, Richard, MD  cholecalciferol (VITAMIN D) 1000 units tablet Take 1,000 Units by mouth daily.    Yes [provider]  ELIQUIS 5 MG TABS tablet Take 5 mg by mouth 2 (two) times daily. 05/04/18  Yes [provider]  furosemide (LASIX) 20 MG  tablet TAKE 1 TABLET(20 MG) BY MOUTH TWICE DAILY Patient taking differently: Take 20 mg by mouth 2 (two) times daily.  08/10/19  Yes Wellington Hampshire, MD  lisinopril (PRINIVIL,ZESTRIL) 2.5 MG tablet Take 1 tablet (2.5 mg total) by mouth daily. 06/18/18  Yes Wieting, Richard, MD  potassium chloride SA (K-DUR,KLOR-CON) 20 MEQ tablet Take 1 tablet (20 mEq total) by mouth daily. 06/18/18  Yes Wieting, Richard, MD  spironolactone (ALDACTONE) 25 MG tablet Take 0.5 tablets (12.5 mg total) by mouth daily. 06/18/18  Yes Loletha Grayer, MD    Family History  Problem Relation Age of Onset  . Hypertension Mother   . Heart attack Father   . Hypertension Sister   . Cancer Brother   . Stroke Brother      Social History   Tobacco Use  . Smoking status: Never Smoker  . Smokeless tobacco: Never Used  Substance Use Topics  . Alcohol use: No    Alcohol/week: 0.0 standard drinks  . Drug use: No    Allergies as of 12/05/2019 - Review Complete 12/05/2019  Allergen Reaction Noted  . Sulfa antibiotics Shortness Of Breath, Other (See Comments), and Palpitations 12/15/2014    Review of Systems:    All systems reviewed and negative except where noted in HPI.   Physical Exam:  Vital signs in last 24 hours: Temp:  [97.6 F (36.4 C)-97.8 F (36.6 C)] 97.8 F (36.6 C) (12/23 0816) Pulse Rate:  [57-74] 73 (12/23 0816) Resp:  [10-20] 17 (12/23 0816) BP: (91-125)/(45-84) 99/49 (12/23 0816) SpO2:  [99 %-100 %] 100 % (12/23 0816) Weight:  [80 kg] 80 kg (12/22 2243)   General:   Pleasant, cooperative in NAD Head:  Normocephalic and atraumatic. Eyes:   No icterus.   Conjunctiva pink. PERRLA. Ears:  Normal auditory acuity. Neck:  Supple; no masses or thyroidomegaly Lungs: Respirations even and unlabored. Lungs clear to auscultation bilaterally.   No wheezes, crackles, or rhonchi.  Heart:  Regular rate and rhythm;  Without murmur, clicks, rubs or gallops Abdomen:  Soft, nondistended, nontender. Normal bowel  sounds. No appreciable masses or hepatomegaly.  No rebound or guarding.  Rectal:  Not performed. Msk:  Symmetrical without gross deformities.    Extremities:  Without edema, cyanosis or clubbing. Neurologic:  Alert and oriented x3;  grossly normal neurologically. Skin:  Intact without significant lesions or rashes. Cervical Nodes:  No significant cervical adenopathy. Psych:  Alert and cooperative. Normal affect.  LAB RESULTS: Recent Labs    12/05/19 1416 12/05/19 2152 12/06/19 0316  WBC 6.6 5.6 5.7  HGB 9.8* 10.2* 9.3*  HCT 30.2* 30.8* 27.4*  PLT 71* 77* PLATELET CLUMPS NOTED ON SMEAR, UNABLE TO ESTIMATE   BMET Recent Labs    12/05/19 0953 12/06/19 0316  NA 131* 132*  K 3.7 4.1  CL 102 106  CO2 16* 17*  GLUCOSE 77 62*  BUN 38* 42*  CREATININE 3.52* 3.50*  CALCIUM 8.7* 8.2*   LFT Recent Labs    12/05/19 0953  PROT 5.7*  ALBUMIN 2.9*  AST 200*  ALT 35  ALKPHOS 221*  BILITOT 2.5*   PT/INR Recent Labs    12/05/19 0953  LABPROT 25.0*  INR 2.3*    STUDIES: No results found.    Impression / Plan:   Assessment: Principal Problem:   Bloody diarrhea Active Problems:   Chronic combined systolic (congestive) and diastolic (congestive) heart failure (HCC)   HTN (hypertension)   Atrial fibrillation with controlled ventricular response (HCC)   Thrombocytopenia (HCC)   Acute renal failure superimposed on stage 3a chronic kidney disease (HCC)   HLD (hyperlipidemia)   Liver cirrhosis (HCC)   Anemia due to blood loss   DNR (do not resuscitate)   [name] is a 85 y.o. y/o female with being admitted with a GI bleeding.  The patient has been on anticoagulation for A. fib.  She has reported some dark sticky stools.  The patient's hemoglobin has been stable.  The patient has been on Eliquis just prior to coming to the hospital.  She has had a cardiac work-up with a ejection fraction of 40 to 45%.  She has been in marked as a DNR by her family.  Despite this I  have spoken to the patient's daughter who would like to proceed with a work-up for this patient's GI bleed.  Plan:  The patient will be set up for a upper endoscopy today.  The patient will have the EGD to look for a possible upper GI source of her GI bleeding.  The patient does not appear to have had a colonoscopy in the recent past.  The patient may need a colonoscopy if the EGD does not show a cause for the patient's symptoms.  The patient has had a relatively low blood pressure therefore we are giving her some fluids and seeing if her blood pressure comes up prior to proceeding with any endoscopic procedures today.  If the blood pressure remains low then she may need to have the procedure is postponed.  Thank you for involving me in the care of this patient.      LOS: 1 day   [name], MD  12/06/2019, 9:32 AM Pager [phone] 7am-5pm  Check AMION for 5pm -7am coverage and on weekends   Note: This dictation was prepared with Dragon dictation along with smaller phrase technology. Any transcriptional errors that result from this process are unintentional.

## 2019-12-06 NOTE — Transfer of Care (Signed)
Immediate Anesthesia Transfer of Care Note  Patient: [name]  Procedure(s) Performed: ESOPHAGOGASTRODUODENOSCOPY (EGD) WITH PROPOFOL (N/A )  Patient Location: PACU and Endoscopy Unit  Anesthesia Type:General  Level of Consciousness: awake and drowsy  Airway & Oxygen Therapy: Patient Spontanous Breathing and Patient connected to nasal cannula oxygen  Post-op Assessment: Report given to RN and Post -op Vital signs reviewed and stable  Post vital signs: Reviewed and stable  Last Vitals:  Vitals Value Taken Time  BP 92/61 12/06/19 1128  Temp 36.1 C 12/06/19 1125  Pulse 75 12/06/19 1128  Resp 11 12/06/19 1128  SpO2 100 % 12/06/19 1128  Vitals shown include unvalidated device data.  Last Pain:  Vitals:   12/06/19 1125  TempSrc: Temporal  PainSc:          Complications: No apparent anesthesia complications

## 2019-12-06 NOTE — Progress Notes (Signed)
Pt has not voided today, she was bladder scanned by this nurse and only 43mls were observed.  MD made aware and orders were placed in advance diet to clear liquid and advance as tolerated.  Pt given clear liquid tray she consumed all her italian ice, drank her apple juice and about 50% of her chicken broth without issue.  PO meds were also tolerated this evening whole without issue.  Sitting up in bed at this time and wanting to keep her tray for now.  Will monitor.

## 2019-12-06 NOTE — Progress Notes (Signed)
Patient was transported in bed by two RNs from Endoscopy to room 157. No issues. Face to face contact made with patient's primary RN Elmyra Ricks. Advised per Dr. Marcello Moores to contact rounding MD if there are further issues with blood pressure.

## 2019-12-06 NOTE — Anesthesia Preprocedure Evaluation (Signed)
Anesthesia Evaluation  Patient identified by MRN, date of birth, ID band Patient awake    Reviewed: Allergy & Precautions, NPO status , Patient's Chart, lab work & pertinent test results, reviewed documented beta blocker date and time   Airway Mallampati: II  TM Distance: >3 FB     Dental  (+) Chipped   Pulmonary           Cardiovascular hypertension, Pt. on medications and Pt. on home beta blockers + Peripheral Vascular Disease and +CHF  + dysrhythmias Atrial Fibrillation      Neuro/Psych    GI/Hepatic   Endo/Other    Renal/GU Renal disease     Musculoskeletal  (+) Arthritis ,   Abdominal   Peds  Hematology  (+) anemia ,   Anesthesia Other Findings EF 40-45 1 yr ago. Poor R waves on EKG. No cardiac symptoms. Low BPs this am. Will give fluids.   Reproductive/Obstetrics                             Anesthesia Physical Anesthesia Plan  ASA: III  Anesthesia Plan: General   Post-op Pain Management:    Induction: Intravenous  PONV Risk Score and Plan:   Airway Management Planned:   Additional Equipment:   Intra-op Plan:   Post-operative Plan:   Informed Consent: I have reviewed the patients History and Physical, chart, labs and discussed the procedure including the risks, benefits and alternatives for the proposed anesthesia with the patient or authorized representative who has indicated his/her understanding and acceptance.       Plan Discussed with: CRNA  Anesthesia Plan Comments:         Anesthesia Quick Evaluation

## 2019-12-06 NOTE — Plan of Care (Signed)
General admission information discussed with patient

## 2019-12-06 NOTE — Progress Notes (Signed)
Pt with a CBG of 51 this am.  Pt was aloert and responding appropriately.  Order set for hypoglycemia initiated dextrose 25g/48ml was administered via IV push, sugar rechecked 67m after dextrose administration and reading was 105. Will monitor. NBrown, LPN

## 2019-12-06 NOTE — Progress Notes (Signed)
Last blood pressure is 92/40, heart rate of 71 bmp. Discussed with Dr. Marcello Moores of anesthesia, as he had rounded on [name] earlier. She remains awake, alert and has been inquiring about the weather outside. Will transport to inpatient room at this time.

## 2019-12-06 NOTE — Op Note (Signed)
[facility] Gastroenterology Patient Name: Jordan LameCecil Hopkins Procedure Date: 12/06/2019 10:57 AM MRN: [id number] Account #: [id number] Date of Birth: 10-03-1931 Admit Type: Inpatient Age: 85 [facility] 1 Gender: Female Note Status: Finalized Procedure:             Upper GI endoscopy Indications:           Melena Providers:             Midge Miniumarren Gery Sabedra MD, MD Medicines:             Propofol per Anesthesia Complications:         No immediate complications. Procedure:             Pre-Anesthesia Assessment:                        - Prior to the procedure, a History and Physical was                         performed, and patient medications and allergies were                         reviewed. The patient's tolerance of previous                         anesthesia was also reviewed. The risks and benefits                         of the procedure and the sedation options and risks                         were discussed with the patient. All questions were                         answered, and informed consent was obtained. Prior                         Anticoagulants: The patient has taken Eliquis                         (apixaban), last dose was 1 day prior to procedure.                         ASA Grade Assessment: II - A patient with mild                         systemic disease. After reviewing the risks and                         benefits, the patient was deemed in satisfactory                         condition to undergo the procedure.                        After obtaining informed consent, the endoscope was                         passed under direct vision. Throughout the procedure,  the patient's blood pressure, pulse, and oxygen                         saturations were monitored continuously. The Endoscope                         was introduced through the mouth, and advanced to the                         second part of duodenum. The  upper GI endoscopy was                         accomplished without difficulty. The patient tolerated                         the procedure well. Findings:      A small hiatal hernia was present.      Moderate portal hypertensive gastropathy was found in the entire       examined stomach.      The examined duodenum was normal. Impression:            - Small hiatal hernia.                        - Portal hypertensive gastropathy.                        - Normal examined duodenum.                        - No specimens collected. Recommendation:        - Return patient to hospital ward for ongoing care.                        - Resume regular diet.                        - Continue present medications.                        - I spoke to the daughter about the results and they                         wish not to do anything invasive at this time since                         she does not appeat to have any further bleeding. Procedure Code(s):     --- Professional ---                        [phone], Esophagogastroduodenoscopy, flexible,                         transoral; diagnostic, including collection of                         specimen(s) by brushing or washing, when performed                         (separate procedure) Diagnosis Code(s):     ---  Professional ---                        K92.1, Melena (includes Hematochezia)                        K76.6, Portal hypertension CPT copyright 2019 American Medical Association. All rights reserved. The codes documented in this report are preliminary and upon coder review may  be revised to meet current compliance requirements. [name] MD, MD 12/06/2019 11:34:44 AM This report has been signed electronically. Number of Addenda: 0 Note Initiated On: 12/06/2019 10:57 AM Estimated Blood Loss:  Estimated blood loss: none.      [facility]

## 2019-12-06 NOTE — Progress Notes (Signed)
[facility] Progress Note  Patient: [name] TGG:[id number]   PCP: Christoper Fabian, MD DOB: 1931/02/22   DOA: 12/05/2019   DOS: 12/06/2019   Date of Service: the patient was seen and examined on 12/06/2019  Chief Complaint  Patient presents with  . GI Bleeding   Brief hospital course: [name] is a 85 y.o. female with medical history significant of hypertension, hyperlipidemia, atrial fibrillation on Eliquis, PAD, CHF with EF of 40-45%, AAA repair, hernia repair, CKD stage III, thrombocytopenia, liver cirrhosis, who presents with dark bloody diarrhea.   Patient is a poor historian, but reports that he has been having diarrhea with dark bloody stool in the past 2 weeks intermittently. I also called her daughter and son to have obtained some history. She does not have abdominal pain.  She has nausea, no vomiting.  She denies chest pain, shortness of breath, cough.  No fever or chills.  She has a bilateral leg edema.  No unilateral numbness or tingling his extremities. She has abdominal bulging which she attributed to hx of hernia repair.  Currently further plan is monitor H&H.  Subjective: Has fatigue and tiredness.  Minimal urine output.  No nausea no vomiting.  Minimal oral intake.  Reports gas.  Assessment and Plan: Scheduled Meds: . atorvastatin  20 mg Oral q1800  . cholecalciferol  1,000 Units Oral Daily  . influenza vaccine adjuvanted  0.5 mL Intramuscular Tomorrow-1000  . pantoprazole  40 mg Oral BID AC   Continuous Infusions: PRN Meds: albuterol, budesonide, ondansetron (ZOFRAN) IV  Bloody diarrhea and anemia due to blood loss Portal hypertensive gastropathy. History of cirrhosis. :  EGD was performed. Appreciate GI assistance. Patient is on PPI will continue orally. EGD shows no evidence of active bleeding and shows portal hypertensive gastropathy. Monitor CBC. Transfuse for hemoglobin less than 7.  Chronic combined systolic (congestive) and  diastolic (congestive) heart failure ([place]): 2D echo on 09/02/2018 showed EF of 40-45% with grade 1 diastolic dysfunction.  Patient has 2+ leg edema, elevated BNP 2407.  Although patient does not have worsening shortness of breath, she is at high risk of developing CHF exacerbation. -Diuretics is on hold (Lasix and spironolactone) due to worsening renal function. -Hold off for further IV fluid  HTN:  -Continue home medications: Coreg -Hold lisinopril and diuretics -hydralazine prn  Atrial fibrillation with controlled ventricular response ([place]): -hold Eliquis -continue coreg  Thrombocytopenia ([place]): This is chronic issue.  Likely due to liver cirrhosis.  Platelets 80 -Follow-up with CBC  Acute renal failure superimposed on stage 3a chronic kidney disease ([place]): Baseline Cre is <1.0, pt's Cre is 3.52 and BUN 38 on admission.  - IVF: Received 1 L normal saline in ED - Follow up renal function by BMP - Avoid using renal toxic medications, hypotension and contrast dye (or carefully use) - Hold lisinopril, diuretics  HLD (hyperlipidemia): -lipitor  Liver cirrhosis ([place]): ALP 221, AST 200, ALT 35, total bilirubin 2.5.  Mental status okay. Monitor  Diet: Cardiac diet and Carb modified diet  DVT Prophylaxis: SCD, pharmacological prophylaxis contraindicated due to GI bleed   Advance goals of care discussion: Full code  Family Communication: no family was present at bedside, at the time of interview.  Disposition:  Discharge to home tomorrow.  Consultants: Gastroenterology Procedures: EGD  Antibiotics: Anti-infectives (From admission, onward)   None       Objective: Physical Exam: Vitals:   12/06/19 1237 12/06/19 1317 12/06/19 1346 12/06/19 1506  BP: (!) 88/37 Marland Kitchen)  91/43 (!) 97/42 (!) 96/39  Pulse: 72 68 69 74  Resp: 17   17  Temp: (!) 97.5 F (36.4 C)   (!) 97.5 F (36.4 C)  TempSrc: Oral   Oral  SpO2: 100%   100%  Weight:      Height:        Intake/Output  Summary (Last 24 hours) at 12/06/2019 1954 Last data filed at 12/06/2019 1900 Gross per 24 hour  Intake 925.13 ml  Output 300 ml  Net 625.13 ml   Filed Weights   12/05/19 0925 12/05/19 2243 12/06/19 0939  Weight: 72.6 kg 80 kg 80 kg   General: alert and oriented to time, place, and person. Appear in mild distress, affect flat in affect Eyes: PERRL, Conjunctiva normal ENT: Oral Mucosa Clear, moist  Neck: no JVD, no Abnormal Mass Or lumps Cardiovascular: S1 and S2 Present, no Murmur,  Respiratory: good respiratory effort, Bilateral Air entry equal and Decreased, no signs of accessory muscle use, Clear to Auscultation, no Crackles, no wheezes Abdomen: Bowel Sound present, Soft and no tenderness, no hernia Skin: no rashes  Extremities: bilateral trace Pedal edema, no calf tenderness Neurologic: without any new focal findings no asterixis Gait not checked due to patient safety concerns  Data Reviewed: I have personally reviewed and interpreted daily labs, tele strips, imagings as discussed above. I reviewed all nursing notes, pharmacy notes, vitals, pertinent old records I have discussed plan of care as described above with RN and patient/family.  CBC: Recent Labs  Lab 12/05/19 0953 12/05/19 1416 12/05/19 2152 12/06/19 0316 12/06/19 0909 12/06/19 1527  WBC 5.8 6.6 5.6 5.7 5.3 5.4  NEUTROABS 4.1  --   --   --   --   --   HGB 10.5* 9.8* 10.2* 9.3* 9.5* 9.8*  HCT 31.3* 30.2* 30.8* 27.4* 26.6* 28.6*  MCV 89.7 95.0 93.3 89.5 85.0 88.5  PLT 80* 71* 77* PLATELET CLUMPS NOTED ON SMEAR, UNABLE TO ESTIMATE 74* 71*   Basic Metabolic Panel: Recent Labs  Lab 12/05/19 0953 12/06/19 0316  NA 131* 132*  K 3.7 4.1  CL 102 106  CO2 16* 17*  GLUCOSE 77 62*  BUN 38* 42*  CREATININE 3.52* 3.50*  CALCIUM 8.7* 8.2*    Liver Function Tests: Recent Labs  Lab 12/05/19 0953  AST 200*  ALT 35  ALKPHOS 221*  BILITOT 2.5*  PROT 5.7*  ALBUMIN 2.9*   No results for input(s): LIPASE,  AMYLASE in the last 168 hours. Recent Labs  Lab 12/05/19 2152  AMMONIA 28   Coagulation Profile: Recent Labs  Lab 12/05/19 0953  INR 2.3*   Cardiac Enzymes: No results for input(s): CKTOTAL, CKMB, CKMBINDEX, TROPONINI in the last 168 hours. BNP (last 3 results) No results for input(s): PROBNP in the last 8760 hours. CBG: Recent Labs  Lab 12/06/19 0818 12/06/19 0908  GLUCAP 51* 105*   Studies: No results found.   Time spent: 35 minutes  Author: Lynden Oxford, MD [facility] 12/06/2019 7:54 PM  To reach On-call, see care teams to locate the attending and reach out to them via [email or web address]. If 7PM-7AM, please contact night-coverage If you still have difficulty reaching the attending provider, please page the [facility] (Director on Call) for [facility] on amion for assistance.

## 2019-12-06 NOTE — Progress Notes (Signed)
Pt BP 83/39. Physician on call notified.

## 2019-12-06 NOTE — Care Management (Signed)
On arrival this am BPs 80/49. IV started. Fluids given. Procedure was done - uneventful. Fluids given in PACU. Now in the 92-98 range. OK to transport her to floor.  Floor nurses to call internist if BPs fall to the 80 range.

## 2019-12-06 NOTE — Anesthesia Postprocedure Evaluation (Signed)
Anesthesia Post Note  Patient: [name]  Procedure(s) Performed: ESOPHAGOGASTRODUODENOSCOPY (EGD) WITH PROPOFOL (N/A )  Patient location during evaluation: Endoscopy Anesthesia Type: General Level of consciousness: awake and alert Pain management: pain level controlled Vital Signs Assessment: post-procedure vital signs reviewed and stable Respiratory status: spontaneous breathing, nonlabored ventilation, respiratory function stable and patient connected to nasal cannula oxygen Cardiovascular status: blood pressure returned to baseline and stable Postop Assessment: no apparent nausea or vomiting Anesthetic complications: no     Last Vitals:  Vitals:   12/06/19 1216 12/06/19 1237  BP: (!) 92/40 (!) 88/37  Pulse:  72  Resp:  17  Temp:  (!) 36.4 C  SpO2:  100%    Last Pain:  Vitals:   12/06/19 1237  TempSrc: Oral  PainSc:                  Terasa Orsini S

## 2019-12-06 NOTE — Anesthesia Post-op Follow-up Note (Signed)
Anesthesia QCDR form completed.        

## 2019-12-06 NOTE — Progress Notes (Signed)
BP trending down. BP currently 78/36. Physician on call notified.

## 2019-12-06 NOTE — OR Nursing (Signed)
PER DR THOMAS FOR LOW BP . GIVE A 300CC BOLUS OF NS FOR SYSTOLIC IN 73H. NORMAL BP IS SYSTOLIC 78X. IV LEFT FOREARM INFILTRATED-RED AND SWOLLEN. WILL REMOVE AND RESTART NEW ACCESS

## 2019-12-06 NOTE — Progress Notes (Signed)
Nurse reports patient with no urine output since admission and bladder scan with on ly 12 ml of volume. HGB stable, BP low/soft .  Wit heart failure history will give 250 NS bolus only and continue to monitor

## 2019-12-07 ENCOUNTER — Encounter: Payer: Self-pay | Admitting: Internal Medicine

## 2019-12-07 ENCOUNTER — Inpatient Hospital Stay: Payer: Medicare Other

## 2019-12-07 DIAGNOSIS — N170 Acute kidney failure with tubular necrosis: Secondary | ICD-10-CM

## 2019-12-07 DIAGNOSIS — R579 Shock, unspecified: Secondary | ICD-10-CM

## 2019-12-07 DIAGNOSIS — K7460 Unspecified cirrhosis of liver: Secondary | ICD-10-CM

## 2019-12-07 DIAGNOSIS — D500 Iron deficiency anemia secondary to blood loss (chronic): Secondary | ICD-10-CM

## 2019-12-07 DIAGNOSIS — Z7189 Other specified counseling: Secondary | ICD-10-CM

## 2019-12-07 DIAGNOSIS — N1831 Chronic kidney disease, stage 3a: Secondary | ICD-10-CM

## 2019-12-07 DIAGNOSIS — Z515 Encounter for palliative care: Secondary | ICD-10-CM

## 2019-12-07 DIAGNOSIS — R571 Hypovolemic shock: Secondary | ICD-10-CM

## 2019-12-07 DIAGNOSIS — R197 Diarrhea, unspecified: Secondary | ICD-10-CM

## 2019-12-07 LAB — CBC
HCT: 24.5 % — ABNORMAL LOW (ref 36.0–46.0)
HCT: 26.4 % — ABNORMAL LOW (ref 36.0–46.0)
HCT: 29.8 % — ABNORMAL LOW (ref 36.0–46.0)
HCT: 30.7 % — ABNORMAL LOW (ref 36.0–46.0)
Hemoglobin: 8.8 g/dL — ABNORMAL LOW (ref 12.0–15.0)
Hemoglobin: 8.9 g/dL — ABNORMAL LOW (ref 12.0–15.0)
Hemoglobin: 9.8 g/dL — ABNORMAL LOW (ref 12.0–15.0)
Hemoglobin: 10 g/dL — ABNORMAL LOW (ref 12.0–15.0)
MCH: 30.6 pg (ref 26.0–34.0)
MCH: 31.2 pg (ref 26.0–34.0)
MCH: 31 pg (ref 26.0–34.0)
MCH: 30.6 pg (ref 26.0–34.0)
MCHC: 35.9 g/dL (ref 30.0–36.0)
MCHC: 33.7 g/dL (ref 30.0–36.0)
MCHC: 32.9 g/dL (ref 30.0–36.0)
MCHC: 32.6 g/dL (ref 30.0–36.0)
MCV: 85.1 fL (ref 80.0–100.0)
MCV: 92.6 fL (ref 80.0–100.0)
MCV: 94.3 fL (ref 80.0–100.0)
MCV: 93.9 fL (ref 80.0–100.0)
Platelets: 71 10*3/uL — ABNORMAL LOW (ref 150–400)
Platelets: 77 10*3/uL — ABNORMAL LOW (ref 150–400)
Platelets: 85 10*3/uL — ABNORMAL LOW (ref 150–400)
Platelets: 74 10*3/uL — ABNORMAL LOW (ref 150–400)
RBC: 2.88 MIL/uL — ABNORMAL LOW (ref 3.87–5.11)
RBC: 2.85 MIL/uL — ABNORMAL LOW (ref 3.87–5.11)
RBC: 3.16 MIL/uL — ABNORMAL LOW (ref 3.87–5.11)
RBC: 3.27 MIL/uL — ABNORMAL LOW (ref 3.87–5.11)
RDW: 18.6 % — ABNORMAL HIGH (ref 11.5–15.5)
RDW: 18.1 % — ABNORMAL HIGH (ref 11.5–15.5)
RDW: 18.6 % — ABNORMAL HIGH (ref 11.5–15.5)
RDW: 18.6 % — ABNORMAL HIGH (ref 11.5–15.5)
WBC: 5.5 10*3/uL (ref 4.0–10.5)
WBC: 5 10*3/uL (ref 4.0–10.5)
WBC: 6.9 10*3/uL (ref 4.0–10.5)
WBC: 5.7 10*3/uL (ref 4.0–10.5)
nRBC: 0 % (ref 0.0–0.2)
nRBC: 0 % (ref 0.0–0.2)
nRBC: 0 % (ref 0.0–0.2)
nRBC: 0.3 % — ABNORMAL HIGH (ref 0.0–0.2)

## 2019-12-07 LAB — BASIC METABOLIC PANEL
Anion gap: 13 (ref 5–15)
BUN: 45 mg/dL — ABNORMAL HIGH (ref 8–23)
CO2: 15 mmol/L — ABNORMAL LOW (ref 22–32)
Calcium: 8.1 mg/dL — ABNORMAL LOW (ref 8.9–10.3)
Chloride: 107 mmol/L (ref 98–111)
Creatinine, Ser: 3.78 mg/dL — ABNORMAL HIGH (ref 0.44–1.00)
GFR calc Af Amer: 12 mL/min — ABNORMAL LOW (ref >60)
GFR calc non Af Amer: 10 mL/min — ABNORMAL LOW (ref >60)
Glucose, Bld: 89 mg/dL (ref 70–99)
Potassium: 4.3 mmol/L (ref 3.5–5.1)
Sodium: 135 mmol/L (ref 135–145)

## 2019-12-07 LAB — GLUCOSE, CAPILLARY
Glucose-Capillary: 78 mg/dL (ref 70–99)
Glucose-Capillary: 75 mg/dL (ref 70–99)

## 2019-12-07 LAB — MRSA PCR SCREENING: MRSA by PCR: NEGATIVE

## 2019-12-07 LAB — LACTIC ACID, PLASMA: Lactic Acid, Venous: 2.3 mmol/L (ref 0.5–1.9)

## 2019-12-07 MED ORDER — MIDODRINE HCL 5 MG PO TABS: 5.0000 mg | ORAL_TABLET | Freq: Three times a day (TID) | ORAL | Status: DC

## 2019-12-07 MED ORDER — CHLORHEXIDINE GLUCONATE CLOTH 2 % EX PADS
6.0000 | MEDICATED_PAD | Freq: Every day | CUTANEOUS | Status: DC
  Administered 2019-12-07: 6 via TOPICAL

## 2019-12-07 MED ORDER — SODIUM CHLORIDE 0.9 % IV BOLUS: 500.0000 mL | Freq: Once | INTRAVENOUS | Status: DC

## 2019-12-07 MED ORDER — SODIUM CHLORIDE 0.9 % IV BOLUS
1000.0000 mL | Freq: Once | INTRAVENOUS | Status: AC
  Administered 2019-12-07: 08:00:00 1000 mL via INTRAVENOUS

## 2019-12-07 MED ORDER — MIDODRINE HCL 5 MG PO TABS
5.0000 mg | ORAL_TABLET | Freq: Three times a day (TID) | ORAL | Status: DC
  Administered 2019-12-07: 5 mg via ORAL
  Filled 2019-12-07: qty 1

## 2019-12-07 NOTE — Care Management Important Message (Signed)
Important Message  Patient Details  Name: [name] MRN: [id number] Date of Birth: 13-Jun-1931   Medicare Important Message Given:  Yes     [name] A Murphy Duzan 12/07/2019, 11:06 AM

## 2019-12-07 NOTE — Progress Notes (Signed)
    BRIEF OVERNIGHT PROGRESS REPORT   SUBJECTIVE: Patient noted with acute onset altered mental status this morning with no other associated symptoms. Per staff this is new change from baseline. Patient also has been hypotensive asymptomatic throughout the shift.  OBJECTIVE: On arrival to the ED, he was afebrile with blood pressure 95/48 mm Hg and pulse rate 78 beats/min. There were no focal neurological deficits; she was alert but disoriented x 4. Patient is easily re-directed.  ASSESSMENT:85 y.o.femalewith medical history significant ofhypertension, hyperlipidemia, atrial fibrillation on Eliquis, PAD, CHF with EF of 40-45%, AAA repair, hernia repair, CKD stage III, thrombocytopenia, liver cirrhosis, who presents with dark bloody diarrhea  PLAN: 1. Acute onset altered mental status - Initial concerns for ischemic event given hx of afib currently anticoagulation on hold. Mental status now improved but still not to baseline. - STAT CT head obtained and shows no acute intracranial abnormality - Suspect transient delirium in the absence of toxic metabolic or neurological event? Labs without acute abnormality, hgb stable at 8.8, no s/s of infectious process. - Will continue to monitor  2. Hypotension - Improved with gentle PRN bolus - Hold antihypertensive    Rufina Falco, DNP, CCRN, FNP-C [facility] Nurse Practitioner Between 7pm to [place] - Pager [phone]  After 7am go to [email or web address] - [email or web address]  [facility] [employer]  [phone]

## 2019-12-07 NOTE — Progress Notes (Addendum)
Jordan Lame, MD [facility]   [address], [place], [place] [address] Phone: [phone] Fax : [phone]   Subjective: This patient underwent an upper endoscopy yesterday for Dark stools.  The patient was also found to have anemia.  The upper endoscopy showed hypertensive portal gastropathy consistent with the patient's known cirrhosis.  The patient was transferred to the ICU for hypotension and was found to have an elevated lactic acid of 2.3.  The patient's hemoglobin today was 9.8 up from 8.9 and is up around the baseline she has been here with.  The patient was moved to the ICU due to the hypotension which she was also noted to have prior to and during our endoscopic procedures yesterday.  Objective: Vital signs in last 24 hours: Vitals:   12/07/19 1256 12/07/19 1300 12/07/19 1400 12/07/19 1500  BP: (!) 81/43 (!) 83/44 (!) 76/42 (!) 77/64  Pulse: 78 78 77 78  Resp: 19 16 14 13   Temp: 97.6 F (36.4 C)     TempSrc: Oral     SpO2: 100% 100% 100% 96%  Weight: 95 kg     Height: 5\' 6"  (1.676 m)      Weight change: 7.425 kg  Intake/Output Summary (Last 24 hours) at 12/07/2019 1516 Last data filed at 12/07/2019 1500 Gross per 24 hour  Intake 2406.85 ml  Output --  Net 2406.85 ml     Exam: Heart:: Regular rate and rhythm, S1S2 present or without murmur or extra heart sounds Lungs: normal and clear to auscultation and percussion Abdomen: soft, nontender, normal bowel sounds   Lab Results: @LABTEST2 @ Micro Results: Recent Results (from the past 240 hour(s))  C difficile quick scan w PCR reflex     Status: None   Collection Time: 12/05/19 10:14 AM   Specimen: Stool  Result Value Ref Range Status   C Diff antigen NEGATIVE NEGATIVE Final   C Diff toxin NEGATIVE NEGATIVE Final   C Diff interpretation No C. difficile detected.  Final    Comment: Performed at [facility], [place], [place] [address]  SARS CORONAVIRUS 2 (TAT 6-24 HRS)  Nasopharyngeal Nasopharyngeal Swab     Status: None   Collection Time: 12/05/19 11:14 AM   Specimen: Nasopharyngeal Swab  Result Value Ref Range Status   SARS Coronavirus 2 NEGATIVE NEGATIVE Final    Comment: (NOTE) SARS-CoV-2 target nucleic acids are NOT DETECTED. The SARS-CoV-2 RNA is generally detectable in upper and lower respiratory specimens during the acute phase of infection. Negative results do not preclude SARS-CoV-2 infection, do not rule out co-infections with other pathogens, and should not be used as the sole basis for treatment or other patient management decisions. Negative results must be combined with clinical observations, patient history, and epidemiological information. The expected result is Negative. Fact Sheet for Patients: SugarRoll.be Fact Sheet for Healthcare Providers: https://www.woods-mathews.com/ This test is not yet approved or cleared by the [place] FDA and  has been authorized for detection and/or diagnosis of SARS-CoV-2 by FDA under an Emergency Use Authorization (EUA). This EUA will remain  in effect (meaning this test can be used) for the duration of the COVID-19 declaration under Section 56 4(b)(1) of the Act, 21 U.S.C. section 360bbb-3(b)(1), unless the authorization is terminated or revoked sooner. Performed at [facility], [place] [address], [place], [place] [address]   MRSA PCR Screening     Status: None   Collection Time: 12/07/19 12:46 PM   Specimen: Nasopharyngeal  Result  Value Ref Range Status   MRSA by PCR NEGATIVE NEGATIVE Final    Comment:        The GeneXpert MRSA Assay (FDA approved for NASAL specimens only), is one component of a comprehensive MRSA colonization surveillance program. It is not intended to diagnose MRSA infection nor to guide or monitor treatment for MRSA infections. Performed at [facility], [address], [place], [place] [address]     Studies/Results: CT HEAD WO CONTRAST  Result Date: 12/07/2019 CLINICAL DATA:  85 year old female with encephalopathy. History of atrial fibrillation, anticoagulation was stopped due to GI bleeding. EXAM: CT HEAD WITHOUT CONTRAST TECHNIQUE: Contiguous axial images were obtained from the base of the skull through the vertex without intravenous contrast. COMPARISON:  Head CT 05/15/2018. FINDINGS: Brain: Stable cerebral volume since 2019. No midline shift, mass effect, or evidence of intracranial mass lesion. No ventriculomegaly. No acute intracranial hemorrhage identified. Evidence of a lacunar infarct in the right basal ganglia on series 2, image 14 was present previously. Patchy and confluent bilateral cerebral white matter hypodensity has not significantly changed. No cortically based acute infarct identified. Vascular: Extensive Calcified atherosclerosis at the skull base. No suspicious intracranial vascular hyperdensity. Skull: No acute osseous abnormality identified. Hyperostosis, normal variant. Sinuses/Orbits: Visualized paranasal sinuses and mastoids are stable and well pneumatized. Other: No acute orbit or scalp soft tissue findings. IMPRESSION: 1. No acute intracranial abnormality identified. 2. Stable non contrast CT appearance of chronic small vessel disease since 2019. Electronically Signed   By: Odessa Fleming M.D.   On: 12/07/2019 06:30   Medications: I have reviewed the patient's current medications. Scheduled Meds: . atorvastatin  20 mg Oral q1800  . Chlorhexidine Gluconate Cloth  6 each Topical Daily  . cholecalciferol  1,000 Units Oral Daily  . influenza vaccine adjuvanted  0.5 mL Intramuscular Tomorrow-1000  . pantoprazole  40 mg Oral BID AC   Continuous Infusions: . sodium chloride Stopped (12/07/19 1406)   PRN Meds:.albuterol, budesonide, ondansetron (ZOFRAN) IV   Assessment: Principal Problem:   Bloody diarrhea Active Problems:   Chronic combined systolic (congestive) and  diastolic (congestive) heart failure (HCC)   HTN (hypertension)   Atrial fibrillation with controlled ventricular response ([facility])   Thrombocytopenia (H[facility])   Acute renal failure superimposed on stage 3a chronic kidney disease ([facility])   HLD (hyperlipidemia)   Liver cirrhosis ([facility])   Anemia due to blood loss   DNR (do not resuscitate)   Goals of care, counseling/discussion   Palliative care by specialist    Plan: This patient was seen for anemia and her blood count has been stable.  The patient has had no further sign of any bleeding.  After discussion with the patient's daughter yesterday was decided to continue to monitor the patient's blood count and not proceed with any further intervention at this time.  I will continue to follow along with you.   LOS: 2 days   Midge Minium 12/07/2019, 3:16 PM Pager [phone] 7am-5pm  Check AMION for 5pm -7am coverage and on weekends

## 2019-12-07 NOTE — Consult Note (Signed)
Name: [name] MRN: [id number] DOB: [birth year]     CONSULTATION DATE: 12/07/2019  REFERRING MD : Manuella Ghazi  CHIEF COMPLAINT:    HISTORY OF PRESENT ILLNESS: 85 y.o. female who came in with a history of being on anticoagulation with dark sticky stools over the last few weeks.  She had been taking some Pepto-Bismol for the diarrhea.   The patient was admitted with a hemoglobin of 10.5 that was rechecked and was 9.8 and shortly after that was checked again at 10.2.  This morning the hemoglobin is 9.5.  She has not had any further signs of any bleeding.  She does have a history of cirrhosis from nonalcoholic source with a history of thrombocytopenia and the patient's platelets now are in the 70s to 80 range.  Attempt yesterday showed an AST of 200 with an ALT of 35 and a total bilirubin of 2.5.  The patient's alk phos is also elevated to 221.  S/p EGD no acute findings Transferred to ICU/SD for low BP She is alert and awake Follows commands She is DNR/DNI   PAST MEDICAL HISTORY :   has a past medical history of Arthritis, Chronic diastolic CHF (congestive heart failure) ([place]), Dizziness, Fall, GI bleed, Hypertension, PAD (peripheral artery disease) ([place]), and Persistent atrial fibrillation ([place]).  has a past surgical history that includes ABDOMINAL AORTOGRAM (N/A, 02/24/2017); Lower Extremity Angiography (Bilateral, 02/24/2017); PERIPHERAL VASCULAR ATHERECTOMY (03/24/2017); Hernia repair; Total knee arthroplasty (Left); Joint replacement; Abdominal aortic aneurysm repair; Laparoscopic incisional / umbilical / ventral hernia repair (2010); PERIPHERAL VASCULAR ATHERECTOMY (Right, 03/24/2017); and Intramedullary (im) nail intertrochanteric (Left, 05/16/2018). Prior to Admission medications   Medication Sig Start Date End Date Taking? Authorizing Provider  acetaminophen (TYLENOL) 325 MG tablet Take 2 tablets (650 mg total) by mouth every 6 (six) hours as needed for mild pain or moderate pain (or  Fever >/= 101). 06/17/18  Yes Wieting, Richard, MD  atorvastatin (LIPITOR) 20 MG tablet Take 1 tablet (20 mg total) by mouth daily at 6 PM. 03/25/17  Yes Reino Bellis B, NP  budesonide (PULMICORT) 0.5 MG/2ML nebulizer solution Take 2 mLs (0.5 mg total) by nebulization 2 (two) times daily. Patient taking differently: Take 0.5 mg by nebulization 2 (two) times daily as needed.  06/17/18  Yes Loletha Grayer, MD  carvedilol (COREG) 25 MG tablet Take 1 tablet (25 mg total) by mouth 2 (two) times daily with a meal. 06/17/18  Yes Wieting, Richard, MD  cholecalciferol (VITAMIN D) 1000 units tablet Take 1,000 Units by mouth daily.    Yes [provider]  ELIQUIS 5 MG TABS tablet Take 5 mg by mouth 2 (two) times daily. 05/04/18  Yes [provider]  furosemide (LASIX) 20 MG tablet TAKE 1 TABLET(20 MG) BY MOUTH TWICE DAILY Patient taking differently: Take 20 mg by mouth 2 (two) times daily.  08/10/19  Yes Wellington Hampshire, MD  lisinopril (PRINIVIL,ZESTRIL) 2.5 MG tablet Take 1 tablet (2.5 mg total) by mouth daily. 06/18/18  Yes Wieting, Richard, MD  potassium chloride SA (K-DUR,KLOR-CON) 20 MEQ tablet Take 1 tablet (20 mEq total) by mouth daily. 06/18/18  Yes Wieting, Richard, MD  spironolactone (ALDACTONE) 25 MG tablet Take 0.5 tablets (12.5 mg total) by mouth daily. 06/18/18  Yes Loletha Grayer, MD   Allergies  Allergen Reactions  . Sulfa Antibiotics Shortness Of Breath, Other (See Comments) and Palpitations    dizziness    FAMILY HISTORY:  family history includes Cancer in her brother; Heart attack in her  father; Hypertension in her mother and sister; Stroke in her brother. SOCIAL HISTORY:  reports that she has never smoked. She has never used smokeless tobacco. She reports that she does not drink alcohol or use drugs.    Review of Systems:  Gen:  Denies  fever, sweats, chills weigh loss  HEENT: Denies blurred vision, double vision, ear pain, eye pain, hearing loss, nose bleeds, sore  throat Cardiac:  No dizziness, chest pain or heaviness, chest tightness,edema, No JVD Resp:   No cough, -sputum production, -shortness of breath,-wheezing, -hemoptysis,  Gi: Denies swallowing difficulty, stomach pain, nausea or vomiting, diarrhea, constipation, bowel incontinence Gu:  Denies bladder incontinence, burning urine Ext:   Denies Joint pain, stiffness or swelling Skin: Denies  skin rash, easy bruising or bleeding or hives Endoc:  Denies polyuria, polydipsia , polyphagia or weight change Psych:   Denies depression, insomnia or hallucinations  Other:  All other systems negative     VITAL SIGNS: Temp:  [96.8 F (36 C)-98 F (36.7 C)] 96.8 F (36 C) (12/24 1535) Pulse Rate:  [66-78] 76 (12/24 1535) Resp:  [13-19] 15 (12/24 1535) BP: (74-95)/(36-68) 90/68 (12/24 1535) SpO2:  [96 %-100 %] 100 % (12/24 1535) Weight:  [95 kg-96.9 kg] 95 kg (12/24 1256)     SpO2: 100 % O2 Flow Rate (L/min): 2 L/min    Physical Examination:   GENERAL:NAD, no fevers, chills, no weakness no fatigue HEAD: Normocephalic, atraumatic.  EYES: PERLA, EOMI No scleral icterus.  MOUTH: Moist mucosal membrane.  EAR, NOSE, THROAT: Clear without exudates. No external lesions.  NECK: Supple.  PULMONARY: CTA B/L no wheezing, rhonchi, crackles CARDIOVASCULAR: S1 and S2. Regular rate and rhythm. No murmurs GASTROINTESTINAL: Soft, nontender, nondistended. Positive bowel sounds.  MUSCULOSKELETAL: No swelling, clubbing, or edema.  NEUROLOGIC: No gross focal neurological deficits. 5/5 strength all extremities SKIN: No ulceration, lesions, rashes, or cyanosis.  PSYCHIATRIC: Insight, judgment intact. -depression -anxiety ALL OTHER ROS ARE NEGATIVE   MEDICATIONS: I have reviewed all medications and confirmed regimen as documented    CULTURE RESULTS   Recent Results (from the past 240 hour(s))  C difficile quick scan w PCR reflex     Status: None   Collection Time: 12/05/19 10:14 AM   Specimen: Stool   Result Value Ref Range Status   C Diff antigen NEGATIVE NEGATIVE Final   C Diff toxin NEGATIVE NEGATIVE Final   C Diff interpretation No C. difficile detected.  Final    Comment: Performed at [facility], [place], [place] [address]  SARS CORONAVIRUS 2 (TAT 6-24 HRS) Nasopharyngeal Nasopharyngeal Swab     Status: None   Collection Time: 12/05/19 11:14 AM   Specimen: Nasopharyngeal Swab  Result Value Ref Range Status   SARS Coronavirus 2 NEGATIVE NEGATIVE Final    Comment: (NOTE) SARS-CoV-2 target nucleic acids are NOT DETECTED. The SARS-CoV-2 RNA is generally detectable in upper and lower respiratory specimens during the acute phase of infection. Negative results do not preclude SARS-CoV-2 infection, do not rule out co-infections with other pathogens, and should not be used as the sole basis for treatment or other patient management decisions. Negative results must be combined with clinical observations, patient history, and epidemiological information. The expected result is Negative. Fact Sheet for Patients: SugarRoll.be Fact Sheet for Healthcare Providers: https://www.woods-mathews.com/ This test is not yet approved or cleared by the [place] FDA and  has been authorized for detection and/or diagnosis of SARS-CoV-2 by FDA under an Emergency Use Authorization (  EUA). This EUA will remain  in effect (meaning this test can be used) for the duration of the COVID-19 declaration under Section 56 4(b)(1) of the Act, 21 U.S.C. section 360bbb-3(b)(1), unless the authorization is terminated or revoked sooner. Performed at [facility], [place] [address], [place], [place] [address]   MRSA PCR Screening     Status: None   Collection Time: 12/07/19 12:46 PM   Specimen: Nasopharyngeal  Result Value Ref Range Status   MRSA by PCR NEGATIVE NEGATIVE Final    Comment:        The GeneXpert MRSA Assay (FDA approved for  NASAL specimens only), is one component of a comprehensive MRSA colonization surveillance program. It is not intended to diagnose MRSA infection nor to guide or monitor treatment for MRSA infections. Performed at [facility], [address] [place],  [address]           IMAGING    CT HEAD WO CONTRAST  Result Date: 12/07/2019 CLINICAL DATA:  85 year old female with encephalopathy. History of atrial fibrillation, anticoagulation was stopped due to GI bleeding. EXAM: CT HEAD WITHOUT CONTRAST TECHNIQUE: Contiguous axial images were obtained from the base of the skull through the vertex without intravenous contrast. COMPARISON:  Head CT 05/15/2018. FINDINGS: Brain: Stable cerebral volume since 2019. No midline shift, mass effect, or evidence of intracranial mass lesion. No ventriculomegaly. No acute intracranial hemorrhage identified. Evidence of a lacunar infarct in the right basal ganglia on series 2, image 14 was present previously. Patchy and confluent bilateral cerebral white matter hypodensity has not significantly changed. No cortically based acute infarct identified. Vascular: Extensive Calcified atherosclerosis at the skull base. No suspicious intracranial vascular hyperdensity. Skull: No acute osseous abnormality identified. Hyperostosis, normal variant. Sinuses/Orbits: Visualized paranasal sinuses and mastoids are stable and well pneumatized. Other: No acute orbit or scalp soft tissue findings. IMPRESSION: 1. No acute intracranial abnormality identified. 2. Stable non contrast CT appearance of chronic small vessel disease since 2019. Electronically Signed   By: Genevie Ann M.D.   On: 12/07/2019 06:30    CBC    Component Value Date/Time   WBC 5.7 12/07/2019 1507   RBC 3.27 (L) 12/07/2019 1507   HGB 10.0 (L) 12/07/2019 1507   HGB 12.5 10/17/2014 0550   HCT 30.7 (L) 12/07/2019 1507   HCT 36.3 10/17/2014 0550   PLT 74 (L) 12/07/2019 1507   PLT 113 (L) 10/17/2014  0550   MCV 93.9 12/07/2019 1507   MCV 94 10/17/2014 0550   MCH 30.6 12/07/2019 1507   MCHC 32.6 12/07/2019 1507   RDW 18.6 (H) 12/07/2019 1507   RDW 13.8 10/17/2014 0550   LYMPHSABS 1.0 12/05/2019 0953   LYMPHSABS 2.0 10/17/2014 0550   MONOABS 0.4 12/05/2019 0953   MONOABS 0.7 10/17/2014 0550   EOSABS 0.3 12/05/2019 0953   EOSABS 0.3 10/17/2014 0550   BASOSABS 0.0 12/05/2019 0953   BASOSABS 0.1 10/17/2014 0550   BMP Latest Ref Rng & Units 12/07/2019 12/06/2019 12/05/2019  Glucose 70 - 99 mg/dL 89 62(L) 77  BUN 8 - 23 mg/dL 45(H) 42(H) 38(H)  Creatinine 0.44 - 1.00 mg/dL 3.78(H) 3.50(H) 3.52(H)  Sodium 135 - 145 mmol/L 135 132(L) 131(L)  Potassium 3.5 - 5.1 mmol/L 4.3 4.1 3.7  Chloride 98 - 111 mmol/L 107 106 102  CO2 22 - 32 mmol/L 15(L) 17(L) 16(L)  Calcium 8.9 - 10.3 mg/dL 8.1(L) 8.2(L) 8.7(L)      ASSESSMENT AND PLAN SYNOPSIS  85 y.o.femalewith medical  history significant ofhypertension, hyperlipidemia, atrial fibrillation on Eliquis, PAD, CHF with EF of 40-45%, AAA repair, hernia repair, CKD stage III, thrombocytopenia, liver cirrhosis, who presents with dark bloody diarrhea transferred to SD/ICU for low BP from hypovolumic shock and progressive renal failure  Gentle FLUID hydration in setting of CHF Pressors if needed Consider midodrine Avoid HTN meds Follow up GI recs  ELECTROLYTES -follow labs as needed -replace as needed -pharmacy consultation and following  ACUTE KIDNEY INJURY/Renal Failure -follow chem 7 -follow UO -continue Foley Catheter-assess need -Avoid nephrotoxic agents  Overall prognosis is poor Recommend Hospice    Jakayden Cancio Patricia Pesa, M.D.  [facility]  Medical Director [place] Director [facility] Cardio-Pulmonary Department

## 2019-12-07 NOTE — Progress Notes (Signed)
Pt has hx of afib on eliquis. Medication was stopped due to gi bleed. Pt was experiencing some acute onset confusion and agitation. On call notified. CT scan ordered to rule out ischemic event.

## 2019-12-07 NOTE — Progress Notes (Signed)
Shift summary:  - Patient transferred from 1A to SDU. Bps 80s/40s. Asymptomatic. [place] locked. Sitting in bed eating lunch tray. Denies questions or concerns at this time.

## 2019-12-07 NOTE — Consult Note (Signed)
Consultation Note Date: 12/07/2019   [name]  DOB: [birth year]  MRN: [id number]  Age / Sex: 85 y.o., female  PCP: Guy Begin, MD Referring Physician: Delfino Lovett, MD  Reason for Consultation: Establishing goals of care  HPI/Patient Profile: 85 y.o. female  with past medical history of hypertension, hyperlipidemia, atrial fibrillation on Eliquis, PAD, CHF with EF of 40-45%, AAA repair, hernia repair, CKD stage III, thrombocytopenia, liver cirrhosis, who presents with dark bloody diarrhea. admitted on 12/05/2019 with GIB.   Clinical Assessment and Goals of Care: Mrs. Offner is resting quietly in bed.  She greets me making and mostly keeping eye contact.  She is alert and oriented, pleasant.  She is able to make her needs known.  There is no family at bedside at this time.  We talked about her treatment plan including her GI bleed.  She shares that she feels that she has been eating well enough, sleeping okay.  She shares that she is excepting of where she is in her life, health.  Mrs. Hanssen tells me that she lives in her own home with her son and daughter helping to provide care.  We talked about strength and mobility, rehab if qualified.  At this point, Mrs. Hewins shares that she would like to return to her own home, with home health as needed.  We talked about healthcare power of attorney, see below.  HCPOA   NEXT OF KIN -Mrs. Landau names her daughter, Shaneen Reeser and her son, Cosima Prentiss as her surrogate decision makers.    SUMMARY OF RECOMMENDATIONS   Continue to treat the treatable but no extraordinary measures such as CPR or intubation. Anticipate home with home health as needed Return to the hospital as needed   Code Status/Advance Care Planning:  DNR -treat the treatable but allowing natural death  Symptom Management:   Per hospitalist, no additional needs at  this time.  Palliative Prophylaxis:   Frequent Pain Assessment and Turn Reposition  Additional Recommendations (Limitations, Scope, Preferences):  Treat the treatable but no CPR, no intubation  Psycho-social/Spiritual:   Desire for further Chaplaincy support:no  Additional Recommendations: Caregiving  Support/Resources and Education on Hospice  Prognosis:   Unable to determine, based on outcomes.  6 to 12 months would not be surprising based on advancing age, chronic illness burden, declining functional status.  Only 1 hospitalization in 6 months  Discharge Planning: To be determined, based on outcomes.  Anticipate home with home health      Primary Diagnoses: Present on Admission: . Bloody diarrhea . Thrombocytopenia (HCC) . HTN (hypertension) . Atrial fibrillation with controlled ventricular response (HCC) . Chronic combined systolic (congestive) and diastolic (congestive) heart failure (HCC) . Acute renal failure superimposed on stage 3a chronic kidney disease (HCC) . HLD (hyperlipidemia) . Liver cirrhosis (HCC) . Anemia due to blood loss . DNR (do not resuscitate)   I have reviewed the medical record, interviewed the patient and family, and examined the patient. The following aspects are pertinent.  Past Medical  History:  Diagnosis Date  . Arthritis    "maybe a little" (03/24/2017)  . Chronic diastolic CHF (congestive heart failure) (HCC)    a. TTE 4/19: nl LVSF, nl RVSF, mod LVH, mild MR, trivial TR  . Dizziness   . Fall    a. fractured left hip 05/2018 s/p ORIF  . GI bleed   . Hypertension   . PAD (peripheral artery disease) (HCC)   . Persistent atrial fibrillation (HCC)    a. diagnosed 03/2018; b. CHADS2VASc 8 (CHF, HTN, age x 2, stroke x 2, vascular disease, female); c. Eliquis   Social History   Socioeconomic History  . Marital status: Widowed    Spouse name: Not on file  . Number of children: Not on file  . Years of education: Not on file  .  Highest education level: Not on file  Occupational History  . Not on file  Tobacco Use  . Smoking status: Never Smoker  . Smokeless tobacco: Never Used  Substance and Sexual Activity  . Alcohol use: No    Alcohol/week: 0.0 standard drinks  . Drug use: No  . Sexual activity: Never  Other Topics Concern  . Not on file  Social History Narrative  . Not on file   Social Determinants of Health   Financial Resource Strain:   . Difficulty of Paying Living Expenses: Not on file  Food Insecurity:   . Worried About Programme researcher, broadcasting/film/video in the Last Year: Not on file  . Ran Out of Food in the Last Year: Not on file  Transportation Needs:   . Lack of Transportation (Medical): Not on file  . Lack of Transportation (Non-Medical): Not on file  Physical Activity:   . Days of Exercise per Week: Not on file  . Minutes of Exercise per Session: Not on file  Stress:   . Feeling of Stress : Not on file  Social Connections:   . Frequency of Communication with Friends and Family: Not on file  . Frequency of Social Gatherings with Friends and Family: Not on file  . Attends Religious Services: Not on file  . Active Member of Clubs or Organizations: Not on file  . Attends Banker Meetings: Not on file  . Marital Status: Not on file   Family History  Problem Relation Age of Onset  . Hypertension Mother   . Heart attack Father   . Hypertension Sister   . Cancer Brother   . Stroke Brother    Scheduled Meds: . atorvastatin  20 mg Oral q1800  . cholecalciferol  1,000 Units Oral Daily  . influenza vaccine adjuvanted  0.5 mL Intramuscular Tomorrow-1000  . pantoprazole  40 mg Oral BID AC   Continuous Infusions: . sodium chloride 0 mL (12/07/19 0338)   PRN Meds:.albuterol, budesonide, ondansetron (ZOFRAN) IV Medications Prior to Admission:  Prior to Admission medications   Medication Sig Start Date End Date Taking? Authorizing Provider  acetaminophen (TYLENOL) 325 MG tablet Take 2  tablets (650 mg total) by mouth every 6 (six) hours as needed for mild pain or moderate pain (or Fever >/= 101). 06/17/18  Yes Wieting, Richard, MD  atorvastatin (LIPITOR) 20 MG tablet Take 1 tablet (20 mg total) by mouth daily at 6 PM. 03/25/17  Yes Laverda Page B, NP  budesonide (PULMICORT) 0.5 MG/2ML nebulizer solution Take 2 mLs (0.5 mg total) by nebulization 2 (two) times daily. Patient taking differently: Take 0.5 mg by nebulization 2 (two) times daily as needed.  06/17/18  Yes Loletha Grayer, MD  carvedilol (COREG) 25 MG tablet Take 1 tablet (25 mg total) by mouth 2 (two) times daily with a meal. 06/17/18  Yes Wieting, Richard, MD  cholecalciferol (VITAMIN D) 1000 units tablet Take 1,000 Units by mouth daily.    Yes [provider]  ELIQUIS 5 MG TABS tablet Take 5 mg by mouth 2 (two) times daily. 05/04/18  Yes [provider]  furosemide (LASIX) 20 MG tablet TAKE 1 TABLET(20 MG) BY MOUTH TWICE DAILY Patient taking differently: Take 20 mg by mouth 2 (two) times daily.  08/10/19  Yes Wellington Hampshire, MD  lisinopril (PRINIVIL,ZESTRIL) 2.5 MG tablet Take 1 tablet (2.5 mg total) by mouth daily. 06/18/18  Yes Wieting, Richard, MD  potassium chloride SA (K-DUR,KLOR-CON) 20 MEQ tablet Take 1 tablet (20 mEq total) by mouth daily. 06/18/18  Yes Wieting, Richard, MD  spironolactone (ALDACTONE) 25 MG tablet Take 0.5 tablets (12.5 mg total) by mouth daily. 06/18/18  Yes Loletha Grayer, MD   Allergies  Allergen Reactions  . Sulfa Antibiotics Shortness Of Breath, Other (See Comments) and Palpitations    dizziness   Review of Systems  Unable to perform ROS: Age    Physical Exam Vitals and nursing note reviewed.  Constitutional:      General: She is not in acute distress.    Appearance: She is obese. She is ill-appearing.  HENT:     Head: Atraumatic.  Cardiovascular:     Rate and Rhythm: Normal rate.  Abdominal:     Palpations: Abdomen is soft.     Comments: Obese abdomen   Skin:    General: Skin is warm and dry.  Neurological:     Mental Status: She is oriented to person, place, and time.  Psychiatric:     Comments: Calm and cooperative, not fearful     Vital Signs: BP (!) 82/46 (BP Location: Right Arm)   Pulse 75   Temp 97.8 F (36.6 C) (Oral)   Resp 17   Ht 5\' 6"  (1.676 m)   Wt 96.9 kg   SpO2 100%   BMI 34.48 kg/m  Pain Scale: 0-10   Pain Score: 0-No pain   SpO2: SpO2: 100 % O2 Device:SpO2: 100 % O2 Flow Rate: .O2 Flow Rate (L/min): 2 L/min  IO: Intake/output summary:   Intake/Output Summary (Last 24 hours) at 12/07/2019 0946 Last data filed at 12/07/2019 1660 Gross per 24 hour  Intake 1286.85 ml  Output 300 ml  Net 986.85 ml    LBM: Last BM Date: 12/06/19 Baseline Weight: Weight: 72.6 kg Most recent weight: Weight: 96.9 kg     Palliative Assessment/Data:   Flowsheet Rows     Most Recent Value  Intake Tab  Referral Department  Hospitalist  Unit at Time of Referral  Med/Surg Unit  Palliative Care Primary Diagnosis  Other (Comment) [GIB]  Date Notified  12/07/19  Palliative Care Type  New Palliative care  Reason for referral  Clarify Goals of Care  Date of Admission  12/05/19  Date first seen by Palliative Care  12/07/19  # of days Palliative referral response time  0 Day(s)  # of days IP prior to Palliative referral  2  Clinical Assessment  Palliative Performance Scale Score  30%  Pain Max last 24 hours  Not able to report  Pain Min Last 24 hours  Not able to report  Dyspnea Max Last 24 Hours  Not able to report  Dyspnea Min Last 24  hours  Not able to report  Psychosocial & Spiritual Assessment  Palliative Care Outcomes      Time In: 0920 Time Out: 0950 Time Total: 30 minutes Greater than 50%  of this time was spent counseling and coordinating care related to the above assessment and plan.  Signed by: Katheran Aweasha A Maliha Outten, NP   Please contact Palliative Medicine Team phone at [phone] for questions and concerns.   For individual provider: See Loretha StaplerAmion

## 2019-12-07 NOTE — Progress Notes (Addendum)
[facility] Progress Note  Patient: Jordan BandaCecil J Rue ZOX:[id number]   PCP: [name], Narges, MD DOB: 12-23-1930   DOA: 12/05/2019   DOS: 12/07/2019   Date of Service: the patient was seen and examined on 12/07/2019  Chief Complaint  Patient presents with  . GI Bleeding   Brief hospital course: [name] is a 85 y.o. female with medical history significant of hypertension, hyperlipidemia, atrial fibrillation on Eliquis, PAD, CHF with EF of 40-45%, AAA repair, hernia repair, CKD stage III, thrombocytopenia, liver cirrhosis, who presents with dark bloody diarrhea.  Patient is a poor historian, but reports that he has been having diarrhea with dark bloody stool in the past 2 weeks intermittently. I also called her daughter and son to have obtained some history. She does not have abdominal pain.  She has nausea, no vomiting.  She denies chest pain, shortness of breath, cough.  No fever or chills.  She has a bilateral leg edema.  No unilateral numbness or tingling his extremities. She has abdominal bulging which she attributed to hx of hernia repair.  Currently further plan is monitor H&H.  Subjective: Denies any new complaint.  Remains persistently hypotensive, no further bowel movements  Assessment and Plan: Scheduled Meds: . atorvastatin  20 mg Oral q1800  . Chlorhexidine Gluconate Cloth  6 each Topical Daily  . cholecalciferol  1,000 Units Oral Daily  . influenza vaccine adjuvanted  0.5 mL Intramuscular Tomorrow-1000  . midodrine  5 mg Oral TID WC  . pantoprazole  40 mg Oral BID AC   Continuous Infusions: . sodium chloride Stopped (12/07/19 1406)   PRN Meds: albuterol, budesonide, ondansetron (ZOFRAN) IV  Hypotensive shock with history of hypertension  tried 2 L of normal saline bolus but still blood pressure in 70s -Considering her history of CKD and CHF we will transfer her to stepdown -Goal mean arterial pressure of 50, may need pressors -We will consult intensivist for  critical care management  Bloody diarrhea and anemia due to blood loss Portal hypertensive gastropathy. History of cirrhosis. Status post EGD on 12/23 showing small hiatal hernia, Portal hypertensive gastropathy. -No obvious source of bleeding.  Per Dr. Annabell SabalWohl's discussion with patient's daughter they wish not to do anything invasive at this time since  - she does not appeat to have any further bleeding. Patient is on PPI will continue orally. Transfuse for hemoglobin less than 7.  Chronic combined systolic (congestive) and diastolic (congestive) heart failure (HCC): 2D echo on 09/02/2018 showed EF of 40-45% with grade 1 diastolic dysfunction.  Patient has 2+ leg edema, elevated BNP 2407.  Although patient does not have worsening shortness of breath, she is at high risk of developing CHF exacerbation. -Diuretics is on hold (Lasix and spironolactone) due to worsening renal function. -She has already received 2 L of fluid bolus due to persistent hypotension.  We will monitor her in stepdown as she may need pressors rather than keep giving more fluids.  Atrial fibrillation with controlled ventricular response (HCC): -hold Eliquis -Hold coreg due to hypotension  Thrombocytopenia (HCC): This is chronic issue.  Likely due to liver cirrhosis.  Platelets 80 -Follow-up with CBC  Acute renal failure superimposed on stage 3a chronic kidney disease (HCC): Baseline Cre is <1.0, pt's Cre is 3.52 and BUN 38 on admission.  - IVF: Received at least 2 to 3 L normal saline thus far - Follow up renal function by BMP - Avoid using renal toxic medications, hypotension and contrast dye (or carefully use) -  Hold lisinopril, diuretics I have ordered stat BMP as there was no BMP from this morning -We will consult nephrology  HLD (hyperlipidemia): -lipitor  Liver cirrhosis (HCC): ALP 221, AST 200, ALT 35, total bilirubin 2.5.  Mental status okay. Monitor    Patient looks critically sick with multiorgan  failure and high risk for death Await palliative care consultation (requested this morning at 7:54 AM), transfer to ICU/stepdown   Diet: Cardiac diet and Carb modified diet  DVT Prophylaxis: SCD, pharmacological prophylaxis contraindicated due to GI bleed   Advance goals of care discussion: DNR  Family Communication: I tried calling patient's daughter at listed number [phone] but no luck Disposition: 2 to 3 days depending on clinical condition  Consultants: Gastroenterology Procedures: EGD  Antibiotics: Anti-infectives (From admission, onward)   None       Objective: Physical Exam: Vitals:   12/07/19 1140 12/07/19 1256 12/07/19 1300 12/07/19 1400  BP: (!) 79/44 (!) 81/43 (!) 83/44 (!) 76/42  Pulse: 66 78 78 77  Resp: 17 19 16 14   Temp: 97.7 F (36.5 C) 97.6 F (36.4 C)    TempSrc: Oral Oral    SpO2: 100% 100% 100% 100%  Weight:  95 kg    Height:  5\' 6"  (1.676 m)      Intake/Output Summary (Last 24 hours) at 12/07/2019 1423 Last data filed at 12/07/2019 1406 Gross per 24 hour  Intake 2286.85 ml  Output --  Net 2286.85 ml   Filed Weights   12/06/19 0939 12/07/19 0524 12/07/19 1256  Weight: 80 kg 96.9 kg 95 kg   General: alert and oriented to time, place, and person. Appear in mild distress, affect flat in affect Eyes: PERRL, Conjunctiva normal ENT: Oral Mucosa Clear, moist  Neck: no JVD, no Abnormal Mass Or lumps Cardiovascular: S1 and S2 Present, no Murmur,  Respiratory: good respiratory effort, Bilateral Air entry equal and Decreased, no signs of accessory muscle use, Clear to Auscultation, no Crackles, no wheezes Abdomen: Bowel Sound present, Soft and no tenderness, no hernia Skin: no rashes  Extremities: bilateral trace Pedal edema, no calf tenderness Neurologic: without any new focal findings no asterixis Gait not checked due to patient safety concerns  Data Reviewed: I have personally reviewed and interpreted daily labs, tele strips, imagings as  discussed above. I reviewed all nursing notes, pharmacy notes, vitals, pertinent old records I have discussed plan of care as described above with RN and patient/family.  CBC: Recent Labs  Lab 12/05/19 0953 12/06/19 0909 12/06/19 1527 12/06/19 2150 12/07/19 0303 12/07/19 0939  WBC 5.8 5.3 5.4 5.7 5.5 5.0  NEUTROABS 4.1  --   --   --   --   --   HGB 10.5* 9.5* 9.8* 9.1* 8.8* 8.9*  HCT 31.3* 26.6* 28.6* 27.8* 24.5* 26.4*  MCV 89.7 85.0 88.5 93.6 85.1 92.6  PLT 80* 74* 71* 73* 71* 77*   Basic Metabolic Panel: Recent Labs  Lab 12/05/19 0953 12/06/19 0316  NA 131* 132*  K 3.7 4.1  CL 102 106  CO2 16* 17*  GLUCOSE 77 62*  BUN 38* 42*  CREATININE 3.52* 3.50*  CALCIUM 8.7* 8.2*    Liver Function Tests: Recent Labs  Lab 12/05/19 0953  AST 200*  ALT 35  ALKPHOS 221*  BILITOT 2.5*  PROT 5.7*  ALBUMIN 2.9*   No results for input(s): LIPASE, AMYLASE in the last 168 hours. Recent Labs  Lab 12/05/19 2152  AMMONIA 28   Coagulation Profile: Recent Labs  Lab  12/05/19 0953  INR 2.3*   Cardiac Enzymes: No results for input(s): CKTOTAL, CKMB, CKMBINDEX, TROPONINI in the last 168 hours. BNP (last 3 results) No results for input(s): PROBNP in the last 8760 hours. CBG: Recent Labs  Lab 12/06/19 0818 12/06/19 0908 12/07/19 0746 12/07/19 1253  GLUCAP 51* 105* 78 75   Studies: CT HEAD WO CONTRAST  Result Date: 12/07/2019 CLINICAL DATA:  85 year old female with encephalopathy. History of atrial fibrillation, anticoagulation was stopped due to GI bleeding. EXAM: CT HEAD WITHOUT CONTRAST TECHNIQUE: Contiguous axial images were obtained from the base of the skull through the vertex without intravenous contrast. COMPARISON:  Head CT 05/15/2018. FINDINGS: Brain: Stable cerebral volume since 2019. No midline shift, mass effect, or evidence of intracranial mass lesion. No ventriculomegaly. No acute intracranial hemorrhage identified. Evidence of a lacunar infarct in the right  basal ganglia on series 2, image 14 was present previously. Patchy and confluent bilateral cerebral white matter hypodensity has not significantly changed. No cortically based acute infarct identified. Vascular: Extensive Calcified atherosclerosis at the skull base. No suspicious intracranial vascular hyperdensity. Skull: No acute osseous abnormality identified. Hyperostosis, normal variant. Sinuses/Orbits: Visualized paranasal sinuses and mastoids are stable and well pneumatized. Other: No acute orbit or scalp soft tissue findings. IMPRESSION: 1. No acute intracranial abnormality identified. 2. Stable non contrast CT appearance of chronic small vessel disease since 2019. Electronically Signed   By: Genevie Ann M.D.   On: 12/07/2019 06:30     Time spent (critical care): 35 minutes  Author: Max Sane, MD [facility] 12/07/2019 2:23 PM  To reach On-call, see care teams to locate the attending and reach out to them via [email or web address]. If 7PM-7AM, please contact night-coverage If you still have difficulty reaching the attending provider, please page the [facility] (Director on Call) for [facility] on amion for assistance.

## 2019-12-08 ENCOUNTER — Inpatient Hospital Stay: Payer: Medicare Other

## 2019-12-08 DIAGNOSIS — Z66 Do not resuscitate: Secondary | ICD-10-CM

## 2019-12-08 LAB — COMPREHENSIVE METABOLIC PANEL
ALT: 29 U/L (ref 0–44)
AST: 220 U/L — ABNORMAL HIGH (ref 15–41)
Albumin: 2.4 g/dL — ABNORMAL LOW (ref 3.5–5.0)
Alkaline Phosphatase: 168 U/L — ABNORMAL HIGH (ref 38–126)
Anion gap: 12 (ref 5–15)
BUN: 48 mg/dL — ABNORMAL HIGH (ref 8–23)
CO2: 16 mmol/L — ABNORMAL LOW (ref 22–32)
Calcium: 8.2 mg/dL — ABNORMAL LOW (ref 8.9–10.3)
Chloride: 107 mmol/L (ref 98–111)
Creatinine, Ser: 4.3 mg/dL — ABNORMAL HIGH (ref 0.44–1.00)
GFR calc Af Amer: 10 mL/min — ABNORMAL LOW (ref >60)
GFR calc non Af Amer: 9 mL/min — ABNORMAL LOW (ref >60)
Glucose, Bld: 104 mg/dL — ABNORMAL HIGH (ref 70–99)
Potassium: 4.3 mmol/L (ref 3.5–5.1)
Sodium: 135 mmol/L (ref 135–145)
Total Bilirubin: 2.4 mg/dL — ABNORMAL HIGH (ref 0.3–1.2)
Total Protein: 4.7 g/dL — ABNORMAL LOW (ref 6.5–8.1)

## 2019-12-08 LAB — CBC
HCT: 26 % — ABNORMAL LOW (ref 36.0–46.0)
Hemoglobin: 9 g/dL — ABNORMAL LOW (ref 12.0–15.0)
MCH: 30.9 pg (ref 26.0–34.0)
MCHC: 34.6 g/dL (ref 30.0–36.0)
MCV: 89.3 fL (ref 80.0–100.0)
Platelets: 68 10*3/uL — ABNORMAL LOW (ref 150–400)
RBC: 2.91 MIL/uL — ABNORMAL LOW (ref 3.87–5.11)
RDW: 19 % — ABNORMAL HIGH (ref 11.5–15.5)
WBC: 5.6 10*3/uL (ref 4.0–10.5)
nRBC: 0 % (ref 0.0–0.2)

## 2019-12-08 LAB — URINALYSIS, ROUTINE W REFLEX MICROSCOPIC
Bilirubin Urine: NEGATIVE
Glucose, UA: NEGATIVE mg/dL
Ketones, ur: NEGATIVE mg/dL
Nitrite: NEGATIVE
Protein, ur: 30 mg/dL — AB
Specific Gravity, Urine: 1.015 (ref 1.005–1.030)
WBC, UA: >50 WBC/hpf — ABNORMAL HIGH (ref 0–5)
pH: 5 (ref 5.0–8.0)

## 2019-12-08 LAB — PROTEIN / CREATININE RATIO, URINE
Creatinine, Urine: 334 mg/dL
Protein Creatinine Ratio: 0.19 mg/mg{Cre} — ABNORMAL HIGH (ref 0.00–0.15)
Total Protein, Urine: 64 mg/dL

## 2019-12-08 LAB — GLUCOSE, CAPILLARY: Glucose-Capillary: 96 mg/dL (ref 70–99)

## 2019-12-08 MED ORDER — MIDODRINE HCL 5 MG PO TABS
10.0000 mg | ORAL_TABLET | Freq: Three times a day (TID) | ORAL | Status: DC
  Administered 2019-12-08 – 2019-12-09 (×5): 10 mg via ORAL
  Filled 2019-12-08 (×5): qty 2

## 2019-12-08 MED ORDER — SODIUM BICARBONATE-DEXTROSE 150-5 MEQ/L-% IV SOLN
150.0000 meq | INTRAVENOUS | Status: DC
  Administered 2019-12-08 – 2019-12-09 (×2): 150 meq via INTRAVENOUS
  Filled 2019-12-08 (×4): qty 1000

## 2019-12-08 NOTE — Progress Notes (Signed)
1        [name] NAME: [name]    MR#:  [id number]  DATE OF BIRTH:  March 18, 1931  SUBJECTIVE:  CHIEF COMPLAINT:   Chief Complaint  Patient presents with  . GI Bleeding  No further bleeding.  No complaints, feeling weak REVIEW OF SYSTEMS:  Review of Systems  Constitutional: Positive for malaise/fatigue. Negative for diaphoresis, fever and weight loss.  HENT: Negative for ear discharge, ear pain, hearing loss, nosebleeds, sore throat and tinnitus.   Eyes: Negative for blurred vision and pain.  Respiratory: Negative for cough, hemoptysis, shortness of breath and wheezing.   Cardiovascular: Negative for chest pain, palpitations, orthopnea and leg swelling.  Gastrointestinal: Negative for abdominal pain, blood in stool, constipation, diarrhea, heartburn, nausea and vomiting.  Genitourinary: Negative for dysuria, frequency and urgency.  Musculoskeletal: Negative for back pain and myalgias.  Skin: Negative for itching and rash.  Neurological: Negative for dizziness, tingling, tremors, focal weakness, seizures, weakness and headaches.  Psychiatric/Behavioral: Negative for depression. The patient is not nervous/anxious.     DRUG ALLERGIES:   Allergies  Allergen Reactions  . Sulfa Antibiotics Shortness Of Breath, Other (See Comments) and Palpitations    dizziness   VITALS:  Blood pressure 110/66, pulse 76, temperature 98 F (36.7 C), temperature source Oral, resp. rate 13, height 5\' 6"  (1.676 m), weight 93.2 kg, SpO2 100 %. PHYSICAL EXAMINATION:  Physical Exam HENT:     Head: Normocephalic and atraumatic.  Eyes:     Conjunctiva/sclera: Conjunctivae normal.     Pupils: Pupils are equal, round, and reactive to light.  Neck:     Thyroid: No thyromegaly.     Trachea: No tracheal deviation.  Cardiovascular:     Rate and Rhythm: Normal rate and regular rhythm.     Heart sounds: Normal heart sounds.  Pulmonary:     Effort: Pulmonary effort  is normal. No respiratory distress.     Breath sounds: Normal breath sounds. No wheezing.  Chest:     Chest wall: No tenderness.  Abdominal:     General: Bowel sounds are normal. There is no distension.     Palpations: Abdomen is soft.     Tenderness: There is no abdominal tenderness.  Musculoskeletal:        General: Normal range of motion.     Cervical back: Normal range of motion and neck supple.  Skin:    General: Skin is warm and dry.     Findings: No rash.  Neurological:     Mental Status: She is alert and oriented to person, place, and time.     Cranial Nerves: No cranial nerve deficit.    LABORATORY PANEL:  Female CBC Recent Labs  Lab 12/08/19 0613  WBC 5.6  HGB 9.0*  HCT 26.0*  PLT 68*   ------------------------------------------------------------------------------------------------------------------ Chemistries  Recent Labs  Lab 12/08/19 0613  NA 135  K 4.3  CL 107  CO2 16*  GLUCOSE 104*  BUN 48*  CREATININE 4.30*  CALCIUM 8.2*  AST 220*  ALT 29  ALKPHOS 168*  BILITOT 2.4*   RADIOLOGY:  US RENAL  Result Date: 12/08/2019 CLINICAL DATA:  Acute kidney failure. EXAM: RENAL / URINARY TRACT ULTRASOUND COMPLETE COMPARISON:  CT scan of the abdomen and pelvis dated 07/15/2015 FINDINGS: Right Kidney: Renal measurements: 8.6 x 4.3 x 4.6 cm = volume: 91 mL. Increased echogenicity of the renal parenchyma suggesting renal medical disease. No mass or hydronephrosis visualized. Left Kidney:  Renal measurements: 9.4 x 3.9 x 4.3 cm = volume: 85 mL. Increased echogenicity of the renal parenchyma consistent with renal medical disease no mass or hydronephrosis visualized. Bladder: The bladder is empty with a Foley catheter in place. Other: Ascites. Coarse nodular appearance of the liver consistent with the patient's history of cirrhosis IMPRESSION: Echogenic renal parenchyma consistent with renal medical disease. No renal obstruction. Electronically Signed   By: [name]  M.D.   On: 12/08/2019 11:31   ASSESSMENT AND PLAN:  [name] a 85 y.o.femalewith medical history significant ofhypertension, hyperlipidemia, atrial fibrillation on Eliquis, PAD, CHF with EF of 40-45%, AAA repair, hernia repair, CKD stage III, thrombocytopenia, liver cirrhosis, admitted for dark bloody diarrhea.  Hypotensive/circulatory shock with history of hypertension Blood pressure somewhat improved on midodrine, not requiring any pressors at this time Holding all blood pressure medicine   Acute on chronic kidney disease stage IIIA -creatinine at 4.3 Due to ATN from hypotension Chronic kidney disease secondary to renal vascular disease and hypertension.  Renal ultrasound showing no obstruction -Nephrology consultation - bicarb infusion per nephrology  Bloody diarrhea and anemia due to blood loss Portal hypertensive gastropathy. History of cirrhosis. Status post EGD on 12/23 showing small hiatal hernia, Portal hypertensive gastropathy. -No obvious source of bleeding.  Per Dr. Annabell Hopkins discussion with patient's daughter they wish not to do anything invasive at this time since  - she does not appear to have any further bleeding. - PPI will continue orally. Transfuse for hemoglobin less than 7.  Chronic combined systolic (congestive) and diastolic (congestive) heart failure (HCC): 2D echo on 09/02/2018 showed EF of 40-45% with grade 1 diastolic dysfunction.  Patient has 2+ leg edema, elevated BNP 2407.  -Diuretics is on hold (Lasix and spironolactone) due to worsening renal function.  Atrial fibrillation with controlled ventricular response (HCC): -hold Eliquis -Hold coreg due to hypotension  Acute on chronic thrombocytopenia (HCC): Likely due to liver cirrhosis.  Platelets 80->68 -Monitor CBC  HLD (hyperlipidemia): -lipitor  Liver cirrhosis (HCC): ALP 221, AST 200, ALT 35, total bilirubin 2.5.  Mental status okay. Monitor    Patient looks critically sick with  multiorgan failure and high risk for death   Palliative care consultation pending -she is appropriate for outpatient palliative/hospice  All the records are reviewed and case discussed with Care Management/Social Worker. Management plans discussed with the patient, nursing and they are in agreement.  CODE STATUS: DNR  TOTAL TIME TAKING CARE OF THIS PATIENT: 35 minutes.   More than 50% of the time was spent in counseling/coordination of care: YES  POSSIBLE D/C IN 3-4 DAYS, DEPENDING ON CLINICAL CONDITION.   Delfino Lovett M.D on 12/08/2019 at 1:42 PM  Between 7am to 6pm - Pager - [phone]  After 6pm go to [email or web address] - password [email or web address]  [facility]   CC: Primary care physician; Guy Begin, MD  Note: This dictation was prepared with Dragon dictation along with smaller phrase technology. Any transcriptional errors that result from this process are unintentional.

## 2019-12-08 NOTE — Progress Notes (Signed)
Midge Minium, MD [facility]   [address] [place], [place] [address] Phone: [phone] Fax : [phone]   Subjective: The patient has no complaints today and had an upper endoscopy with hypertensive portal gastropathy from her cirrhosis.  The patient has had a worsening of her kidney function.  She has had no further sign of any bloody diarrhea but her hemoglobin has trended down.  The patient was transferred to the ICU because of low blood pressure which has been chronic for her.  The patient had a low blood pressure during her endoscopic procedures also.   Objective: Vital signs in last 24 hours: Vitals:   12/08/19 0800 12/08/19 0900 12/08/19 1000 12/08/19 1100  BP: (!) 85/63 101/67 (!) 82/38 (!) 92/48  Pulse: 71 77 74 72  Resp: 16 13 15 12   Temp: 98 F (36.7 C)     TempSrc: Oral     SpO2: 100% 99% 100% 100%  Weight:      Height:       Weight change: 15 kg  Intake/Output Summary (Last 24 hours) at 12/08/2019 1138 Last data filed at 12/08/2019 0845 Gross per 24 hour  Intake 1180 ml  Output 0 ml  Net 1180 ml     Exam: Heart:: Regular rate and rhythm, S1S2 present or without murmur or extra heart sounds Lungs: normal and clear to auscultation and percussion Abdomen: soft, nontender, normal bowel sounds   Lab Results: @LABTEST2 @ Micro Results: Recent Results (from the past 240 hour(s))  C difficile quick scan w PCR reflex     Status: None   Collection Time: 12/05/19 10:14 AM   Specimen: Stool  Result Value Ref Range Status   C Diff antigen NEGATIVE NEGATIVE Final   C Diff toxin NEGATIVE NEGATIVE Final   C Diff interpretation No C. difficile detected.  Final    Comment: Performed at [facility], [address] [place], [address] [place]  SARS CORONAVIRUS 2 (TAT 6-24 HRS) Nasopharyngeal Nasopharyngeal Swab     Status: None   Collection Time: 12/05/19 11:14 AM   Specimen: Nasopharyngeal Swab  Result Value Ref Range Status   SARS Coronavirus 2  NEGATIVE NEGATIVE Final    Comment: (NOTE) SARS-CoV-2 target nucleic acids are NOT DETECTED. The SARS-CoV-2 RNA is generally detectable in upper and lower respiratory specimens during the acute phase of infection. Negative results do not preclude SARS-CoV-2 infection, do not rule out co-infections with other pathogens, and should not be used as the sole basis for treatment or other patient management decisions. Negative results must be combined with clinical observations, patient history, and epidemiological information. The expected result is Negative. Fact Sheet for Patients: 13086 Fact Sheet for Healthcare Providers: 12/07/19 This test is not yet approved or cleared by the [place] FDA and  has been authorized for detection and/or diagnosis of SARS-CoV-2 by FDA under an Emergency Use Authorization (EUA). This EUA will remain  in effect (meaning this test can be used) for the duration of the COVID-19 declaration under Section 56 4(b)(1) of the Act, 21 U.S.C. section 360bbb-3(b)(1), unless the authorization is terminated or revoked sooner. Performed at [facility], [address], [place], [address] [place]   MRSA PCR Screening     Status: None   Collection Time: 12/07/19 12:46 PM   Specimen: Nasopharyngeal  Result Value Ref Range Status   MRSA by PCR NEGATIVE NEGATIVE Final    Comment:        The GeneXpert MRSA Assay (  FDA approved for NASAL specimens only), is one component of a comprehensive MRSA colonization surveillance program. It is not intended to diagnose MRSA infection nor to guide or monitor treatment for MRSA infections. Performed at [facility], [address] [place], [place] [address]    Studies/Results: CT HEAD WO CONTRAST  Result Date: 12/07/2019 CLINICAL DATA:  85 year old female with encephalopathy. History of atrial fibrillation, anticoagulation was stopped  due to GI bleeding. EXAM: CT HEAD WITHOUT CONTRAST TECHNIQUE: Contiguous axial images were obtained from the base of the skull through the vertex without intravenous contrast. COMPARISON:  Head CT 05/15/2018. FINDINGS: Brain: Stable cerebral volume since 2019. No midline shift, mass effect, or evidence of intracranial mass lesion. No ventriculomegaly. No acute intracranial hemorrhage identified. Evidence of a lacunar infarct in the right basal ganglia on series 2, image 14 was present previously. Patchy and confluent bilateral cerebral white matter hypodensity has not significantly changed. No cortically based acute infarct identified. Vascular: Extensive Calcified atherosclerosis at the skull base. No suspicious intracranial vascular hyperdensity. Skull: No acute osseous abnormality identified. Hyperostosis, normal variant. Sinuses/Orbits: Visualized paranasal sinuses and mastoids are stable and well pneumatized. Other: No acute orbit or scalp soft tissue findings. IMPRESSION: 1. No acute intracranial abnormality identified. 2. Stable non contrast CT appearance of chronic small vessel disease since 2019. Electronically Signed   By: Genevie Ann M.D.   On: 12/07/2019 06:30   US RENAL  Result Date: 12/08/2019 CLINICAL DATA:  Acute kidney failure. EXAM: RENAL / URINARY TRACT ULTRASOUND COMPLETE COMPARISON:  CT scan of the abdomen and pelvis dated 07/15/2015 FINDINGS: Right Kidney: Renal measurements: 8.6 x 4.3 x 4.6 cm = volume: 91 mL. Increased echogenicity of the renal parenchyma suggesting renal medical disease. No mass or hydronephrosis visualized. Left Kidney: Renal measurements: 9.4 x 3.9 x 4.3 cm = volume: 85 mL. Increased echogenicity of the renal parenchyma consistent with renal medical disease no mass or hydronephrosis visualized. Bladder: The bladder is empty with a Foley catheter in place. Other: Ascites. Coarse nodular appearance of the liver consistent with the patient's history of cirrhosis IMPRESSION:  Echogenic renal parenchyma consistent with renal medical disease. No renal obstruction. Electronically Signed   By: Lorriane Shire M.D.   On: 12/08/2019 11:31   Medications: I have reviewed the patient's current medications. Scheduled Meds: . atorvastatin  20 mg Oral q1800  . Chlorhexidine Gluconate Cloth  6 each Topical Daily  . cholecalciferol  1,000 Units Oral Daily  . influenza vaccine adjuvanted  0.5 mL Intramuscular Tomorrow-1000  . midodrine  10 mg Oral TID WC  . pantoprazole  40 mg Oral BID AC   Continuous Infusions: . sodium bicarbonate 150 mEq in dextrose 5% 1000 mL 150 mEq (12/08/19 1125)  . sodium chloride Stopped (12/07/19 1406)   PRN Meds:.albuterol, budesonide, ondansetron (ZOFRAN) IV   Assessment: Principal Problem:   Bloody diarrhea Active Problems:   Chronic combined systolic (congestive) and diastolic (congestive) heart failure (HCC)   HTN (hypertension)   Atrial fibrillation with controlled ventricular response (HCC)   Thrombocytopenia (HCC)   Acute renal failure superimposed on stage 3a chronic kidney disease (HCC)   HLD (hyperlipidemia)   Liver cirrhosis ([place])   Anemia due to blood loss   DNR (do not resuscitate)   Goals of care, counseling/discussion   Palliative care by specialist    Plan: This patient has cirrhosis with progressive renal failure and was admitted with bloody diarrhea.  The patient was found to have hypertensive portal gastropathy.  There is a decrease in her hemoglobin but no sign of any active bleeding.  I do not recommend any aggressive treatment on this patient at this time and would continue to treat symptomatically.   LOS: 3 days   Midge MiniumDarren Ruthene Methvin 12/08/2019, 11:38 AM Pager [phone] 7am-5pm  Check AMION for 5pm -7am coverage and on weekends

## 2019-12-08 NOTE — Consult Note (Addendum)
[facility]  CONSULT NOTE    Date: 12/08/2019                  Patient Name:  [name]  MRN: [id number]  DOB: [birth year]  Age / Sex: 85 y.o., female         PCP: Christoper Fabian, MD                 Service Requesting Consult: Dr. Mortimer Fries                 Reason for Consult: Acute Renal Failure            History of Present Illness: [name] was admitted to [facility] on 12/05/2019 for Dehydration [E86.0] Bloody diarrhea [R19.7] Acute kidney injury ([place]) [N17.9] Diarrhea, unspecified type [R19.7]   Patient was on Eliquis. Patient was taken for endoscopy on 12/23 where portal gastropathy was done.   Creatinine has been above 3.5 since admission. Now anuric. Patient is asymptomatic.    Medications: Outpatient medications: Medications Prior to Admission  Medication Sig Dispense Refill Last Dose  . acetaminophen (TYLENOL) 325 MG tablet Take 2 tablets (650 mg total) by mouth every 6 (six) hours as needed for mild pain or moderate pain (or Fever >/= 101).   Unknown at PRN  . atorvastatin (LIPITOR) 20 MG tablet Take 1 tablet (20 mg total) by mouth daily at 6 PM. 30 tablet 6 12/04/2019 at Unknown time  . budesonide (PULMICORT) 0.5 MG/2ML nebulizer solution Take 2 mLs (0.5 mg total) by nebulization 2 (two) times daily. (Patient taking differently: Take 0.5 mg by nebulization 2 (two) times daily as needed. ) 120 mL 0 Unknown at PRN  . carvedilol (COREG) 25 MG tablet Take 1 tablet (25 mg total) by mouth 2 (two) times daily with a meal. 60 tablet 0 12/04/2019 at 0900  . cholecalciferol (VITAMIN D) 1000 units tablet Take 1,000 Units by mouth daily.      Marland Kitchen ELIQUIS 5 MG TABS tablet Take 5 mg by mouth 2 (two) times daily.  5 12/04/2019 at 0900  . furosemide (LASIX) 20 MG tablet TAKE 1 TABLET(20 MG) BY MOUTH TWICE DAILY (Patient taking differently: Take 20 mg by mouth 2 (two) times daily. ) 180 tablet 0 12/04/2019 at 0900  . lisinopril (PRINIVIL,ZESTRIL) 2.5 MG  tablet Take 1 tablet (2.5 mg total) by mouth daily. 30 tablet 0 12/04/2019 at 0900  . potassium chloride SA (K-DUR,KLOR-CON) 20 MEQ tablet Take 1 tablet (20 mEq total) by mouth daily. 30 tablet 0 12/04/2019 at 0900  . spironolactone (ALDACTONE) 25 MG tablet Take 0.5 tablets (12.5 mg total) by mouth daily. 30 tablet 0 12/04/2019 at 0900    Current medications: Current Facility-Administered Medications  Medication Dose Route Frequency Provider Last Rate Last Admin  . albuterol (PROVENTIL) (2.5 MG/3ML) 0.083% nebulizer solution 2.5 mg  2.5 mg Inhalation Q4H PRN Ivor Costa, MD      . atorvastatin (LIPITOR) tablet 20 mg  20 mg Oral q1800 Ivor Costa, MD   20 mg at 12/07/19 1702  . budesonide (PULMICORT) nebulizer solution 0.5 mg  0.5 mg Nebulization BID PRN Lavina Hamman, MD      . Chlorhexidine Gluconate Cloth 2 % PADS 6 each  6 each Topical Daily Max Sane, MD   6 each at 12/07/19 1319  . cholecalciferol (VITAMIN D) tablet 1,000 Units  1,000 Units Oral Daily Ivor Costa, MD   1,000 Units at 12/07/19 0756  .  influenza vaccine adjuvanted (FLUAD) injection 0.5 mL  0.5 mL Intramuscular Tomorrow-1000 Lorretta Harp, MD      . midodrine (PROAMATINE) tablet 10 mg  10 mg Oral TID WC Jimmye Norman, NP   10 mg at 12/08/19 0801  . ondansetron (ZOFRAN) injection 4 mg  4 mg Intravenous Q8H PRN Lorretta Harp, MD      . pantoprazole (PROTONIX) EC tablet 40 mg  40 mg Oral BID AC Rolly Salter, MD   40 mg at 12/08/19 0801  . sodium chloride 0.9 % bolus 500 mL  500 mL Intravenous Once Jimmye Norman, NP   Stopped at 12/07/19 1406      Allergies: Allergies  Allergen Reactions  . Sulfa Antibiotics Shortness Of Breath, Other (See Comments) and Palpitations    dizziness      Past Medical History: Past Medical History:  Diagnosis Date  . Arthritis    "maybe a little" (03/24/2017)  . Chronic diastolic CHF (congestive heart failure) (HCC)    a. TTE 4/19: nl LVSF, nl RVSF, mod LVH, mild MR,  trivial TR  . Dizziness   . Fall    a. fractured left hip 05/2018 s/p ORIF  . GI bleed   . Hypertension   . PAD (peripheral artery disease) (HCC)   . Persistent atrial fibrillation (HCC)    a. diagnosed 03/2018; b. CHADS2VASc 8 (CHF, HTN, age x 2, stroke x 2, vascular disease, female); c. Eliquis     Past Surgical History: Past Surgical History:  Procedure Laterality Date  . ABDOMINAL AORTIC ANEURYSM REPAIR     hx/notes 07/26/2012  . ABDOMINAL AORTOGRAM N/A 02/24/2017   Procedure: Abdominal Aortogram;  Surgeon: Iran Ouch, MD;  Location: [facility];  Service: Cardiovascular;  Laterality: N/A;  . HERNIA REPAIR    . INTRAMEDULLARY (IM) NAIL INTERTROCHANTERIC Left 05/16/2018   Procedure: INTRAMEDULLARY (IM) NAIL INTERTROCHANTRIC;  Surgeon: Kennedy Bucker, MD;  Location: [facility] ORS;  Service: Orthopedics;  Laterality: Left;  . JOINT REPLACEMENT    . LAPAROSCOPIC INCISIONAL / UMBILICAL / VENTRAL HERNIA REPAIR  2010   VHR w/mesh hx/notes 07/26/2012  . LOWER EXTREMITY ANGIOGRAPHY Bilateral 02/24/2017   Procedure: Lower Extremity Angiography;  Surgeon: Iran Ouch, MD;  Location: [facility];  Service: Cardiovascular;  Laterality: Bilateral;  . PERIPHERAL VASCULAR ATHERECTOMY  03/24/2017  . PERIPHERAL VASCULAR ATHERECTOMY Right 03/24/2017   Procedure: Peripheral Vascular Atherectomy;  Surgeon: Iran Ouch, MD;  Location: [facility];  Service: Cardiovascular;  Laterality: Right;  . TOTAL KNEE ARTHROPLASTY Left      Family History: Family History  Problem Relation Age of Onset  . Hypertension Mother   . Heart attack Father   . Hypertension Sister   . Cancer Brother   . Stroke Brother      Social History: Social History   Socioeconomic History  . Marital status: Widowed    Spouse name: Not on file  . Number of children: Not on file  . Years of education: Not on file  . Highest education level: Not on file  Occupational History  . Not on file   Tobacco Use  . Smoking status: Never Smoker  . Smokeless tobacco: Never Used  Substance and Sexual Activity  . Alcohol use: No    Alcohol/week: 0.0 standard drinks  . Drug use: No  . Sexual activity: Never  Other Topics Concern  . Not on file  Social History Narrative  . Not on file  Social Determinants of Health   Financial Resource Strain:   . Difficulty of Paying Living Expenses: Not on file  Food Insecurity:   . Worried About Programme researcher, broadcasting/film/video in the Last Year: Not on file  . Ran Out of Food in the Last Year: Not on file  Transportation Needs:   . Lack of Transportation (Medical): Not on file  . Lack of Transportation (Non-Medical): Not on file  Physical Activity:   . Days of Exercise per Week: Not on file  . Minutes of Exercise per Session: Not on file  Stress:   . Feeling of Stress : Not on file  Social Connections:   . Frequency of Communication with Friends and Family: Not on file  . Frequency of Social Gatherings with Friends and Family: Not on file  . Attends Religious Services: Not on file  . Active Member of Clubs or Organizations: Not on file  . Attends Banker Meetings: Not on file  . Marital Status: Not on file  Intimate Partner Violence:   . Fear of Current or Ex-Partner: Not on file  . Emotionally Abused: Not on file  . Physically Abused: Not on file  . Sexually Abused: Not on file     Review of Systems: Review of Systems  Constitutional: Negative.  Negative for chills, diaphoresis, fever, malaise/fatigue and weight loss.  HENT: Negative.  Negative for congestion, ear discharge, ear pain, hearing loss, nosebleeds, sinus pain, sore throat and tinnitus.   Eyes: Negative.  Negative for blurred vision, double vision, photophobia, pain, discharge and redness.  Respiratory: Negative.  Negative for cough, hemoptysis, sputum production, shortness of breath, wheezing and stridor.   Cardiovascular: Negative.  Negative for chest pain,  palpitations, orthopnea, claudication, leg swelling and PND.  Gastrointestinal: Negative.  Negative for abdominal pain, blood in stool, constipation, diarrhea, heartburn, melena, nausea and vomiting.  Genitourinary: Negative.  Negative for dysuria, flank pain, frequency, hematuria and urgency.  Musculoskeletal: Negative.  Negative for back pain, falls, joint pain, myalgias and neck pain.  Skin: Negative.  Negative for itching and rash.  Neurological: Negative.  Negative for dizziness, tingling, tremors, sensory change, speech change, focal weakness, seizures, loss of consciousness, weakness and headaches.  Endo/Heme/Allergies: Negative.  Negative for environmental allergies and polydipsia. Does not bruise/bleed easily.  Psychiatric/Behavioral: Negative.  Negative for depression, hallucinations, memory loss, substance abuse and suicidal ideas. The patient is not nervous/anxious and does not have insomnia.     Vital Signs: Blood pressure (!) 85/63, pulse 71, temperature 98 F (36.7 C), temperature source Oral, resp. rate 16, height  (1.676 m), weight 93.2 kg, SpO2 100 %.  Weight trends: Filed Weights   12/07/19 0524 12/07/19 1256 12/08/19 0500  Weight: 96.9 kg 95 kg 93.2 kg    Physical Exam: General: NAD, sitting up in bed  Head: Normocephalic, atraumatic. Moist oral mucosal membranes  Eyes: Anicteric, PERRL  Neck: Supple, trachea midline  Lungs:  Clear to auscultation  Heart: Regular rate and rhythm  Abdomen:  Soft, nontender,   Extremities: no peripheral edema.  Neurologic: Nonfocal, moving all four extremities  Skin: No lesions         Lab results: Basic Metabolic Panel: Recent Labs  Lab 12/06/19 0316 12/07/19 1507 12/08/19 0613  NA 132* 135 135  K 4.1 4.3 4.3  CL 106 107 107  CO2 17* 15* 16*  GLUCOSE 62* 89 104*  BUN 42* 45* 48*  CREATININE 3.50* 3.78* 4.30*  CALCIUM 8.2* 8.1* 8.2*  Liver Function Tests: Recent Labs  Lab 12/05/19 0953 12/08/19 0613   AST 200* 220*  ALT 35 29  ALKPHOS 221* 168*  BILITOT 2.5* 2.4*  PROT 5.7* 4.7*  ALBUMIN 2.9* 2.4*   No results for input(s): LIPASE, AMYLASE in the last 168 hours. Recent Labs  Lab 12/05/19 2152  AMMONIA 28    CBC: Recent Labs  Lab 12/05/19 0953 12/07/19 0303 12/07/19 0939 12/07/19 1338 12/07/19 1507 12/08/19 0613  WBC 5.8 5.5 5.0 6.9 5.7 5.6  NEUTROABS 4.1  --   --   --   --   --   HGB 10.5* 8.8* 8.9* 9.8* 10.0* 9.0*  HCT 31.3* 24.5* 26.4* 29.8* 30.7* 26.0*  MCV 89.7 85.1 92.6 94.3 93.9 89.3  PLT 80* 71* 77* 85* 74* 68*    Cardiac Enzymes: No results for input(s): CKTOTAL, CKMB, CKMBINDEX, TROPONINI in the last 168 hours.  BNP: Invalid input(s): POCBNP  CBG: Recent Labs  Lab 12/06/19 0818 12/06/19 0908 12/07/19 0746 12/07/19 1253 12/08/19 0800  GLUCAP 51* 105* 78 75 96    Microbiology: Results for orders placed or performed during the hospital encounter of 12/05/19  C difficile quick scan w PCR reflex     Status: None   Collection Time: 12/05/19 10:14 AM   Specimen: Stool  Result Value Ref Range Status   C Diff antigen NEGATIVE NEGATIVE Final   C Diff toxin NEGATIVE NEGATIVE Final   C Diff interpretation No C. difficile detected.  Final    Comment: Performed at [facility], [address] [place], [place] [address]  SARS CORONAVIRUS 2 (TAT 6-24 HRS) Nasopharyngeal Nasopharyngeal Swab     Status: None   Collection Time: 12/05/19 11:14 AM   Specimen: Nasopharyngeal Swab  Result Value Ref Range Status   SARS Coronavirus 2 NEGATIVE NEGATIVE Final    Comment: (NOTE) SARS-CoV-2 target nucleic acids are NOT DETECTED. The SARS-CoV-2 RNA is generally detectable in upper and lower respiratory specimens during the acute phase of infection. Negative results do not preclude SARS-CoV-2 infection, do not rule out co-infections with other pathogens, and should not be used as the sole basis for treatment or other patient management  decisions. Negative results must be combined with clinical observations, patient history, and epidemiological information. The expected result is Negative. Fact Sheet for Patients: HairSlick.nohttps://www.fda.gov/media/138098/download Fact Sheet for Healthcare Providers: quierodirigir.comhttps://www.fda.gov/media/138095/download This test is not yet approved or cleared by the [place] FDA and  has been authorized for detection and/or diagnosis of SARS-CoV-2 by FDA under an Emergency Use Authorization (EUA). This EUA will remain  in effect (meaning this test can be used) for the duration of the COVID-19 declaration under Section 56 4(b)(1) of the Act, 21 U.S.C. section 360bbb-3(b)(1), unless the authorization is terminated or revoked sooner. Performed at [facility], [address] [place], [place] [address]   MRSA PCR Screening     Status: None   Collection Time: 12/07/19 12:46 PM   Specimen: Nasopharyngeal  Result Value Ref Range Status   MRSA by PCR NEGATIVE NEGATIVE Final    Comment:        The GeneXpert MRSA Assay (FDA approved for NASAL specimens only), is one component of a comprehensive MRSA colonization surveillance program. It is not intended to diagnose MRSA infection nor to guide or monitor treatment for MRSA infections. Performed at [facility], [address] [place], [place] [address]     Coagulation Studies: Recent Labs    12/05/19 0953  LABPROT  25.0*  INR 2.3*    Urinalysis: No results for input(s): COLORURINE, LABSPEC, PHURINE, GLUCOSEU, HGBUR, BILIRUBINUR, KETONESUR, PROTEINUR, UROBILINOGEN, NITRITE, LEUKOCYTESUR in the last 72 hours.  Invalid input(s): APPERANCEUR    Imaging: CT HEAD WO CONTRAST  Result Date: 12/07/2019 CLINICAL DATA:  85 year old female with encephalopathy. History of atrial fibrillation, anticoagulation was stopped due to GI bleeding. EXAM: CT HEAD WITHOUT CONTRAST TECHNIQUE: Contiguous axial images were obtained from the base  of the skull through the vertex without intravenous contrast. COMPARISON:  Head CT 05/15/2018. FINDINGS: Brain: Stable cerebral volume since 2019. No midline shift, mass effect, or evidence of intracranial mass lesion. No ventriculomegaly. No acute intracranial hemorrhage identified. Evidence of a lacunar infarct in the right basal ganglia on series 2, image 14 was present previously. Patchy and confluent bilateral cerebral white matter hypodensity has not significantly changed. No cortically based acute infarct identified. Vascular: Extensive Calcified atherosclerosis at the skull base. No suspicious intracranial vascular hyperdensity. Skull: No acute osseous abnormality identified. Hyperostosis, normal variant. Sinuses/Orbits: Visualized paranasal sinuses and mastoids are stable and well pneumatized. Other: No acute orbit or scalp soft tissue findings. IMPRESSION: 1. No acute intracranial abnormality identified. 2. Stable non contrast CT appearance of chronic small vessel disease since 2019. Electronically Signed   By: Odessa Fleming M.D.   On: 12/07/2019 06:30      Assessment & Plan: [name] is a 85 y.o. black female with atrial fibrillation on eliquis, hypertension, congestive heart failure, hyperlipidemia, peripheral arterial disease, AAA status post repair, hepatic cirrhosis,  who was admitted to [facility] on 12/05/2019 for Dehydration [E86.0] Bloody diarrhea [R19.7] Acute kidney injury (HCC) [N17.9] Diarrhea, unspecified type [R19.7]   1. Acute renal failure on chronic kidney disease stage IIIA with history of hematuria: baseline creatinine of 1.05, GFR of 55 in 06/2018.  Chronic kidney disease secondary to renal vascular disease and hypertension.  Acute renal failure secondary to ATN from GI bleed, anemia hypotension and prerenal azotemia No IV contrast exposure.  - holding lisinopril, spironolactone and furosemide. And potassium chloride.  - Check bladder scan, place foley catheter, and check  renal ultrasound - Check urine studies if possible.  - Low threshold for renal replacement therapy.  - Check SPEP/UPEP and free light chains.  - Start bicarb infusion  2. Hypotension: not requiring vasopressors. Secondary to GI bleed and hypotensive shock.  Home blood pressure regimen of lisinopril, spironolactone, furosemide and carvedilol. All on hold.  - midodrine  3. Anemia with renal failure and GI bleed: no indication for blood transfusion Endoscopy on 12/23 by Dr. Servando Snare revealed portal gastropathy consistent with hepatic cirrhosis.  With thrombocytopenia.   LOS: 3 Adream Parzych 12/25/20209:51 AM

## 2019-12-09 DIAGNOSIS — Z7189 Other specified counseling: Secondary | ICD-10-CM

## 2019-12-09 LAB — CBC
HCT: 27.3 % — ABNORMAL LOW (ref 36.0–46.0)
Hemoglobin: 9.3 g/dL — ABNORMAL LOW (ref 12.0–15.0)
MCH: 31.2 pg (ref 26.0–34.0)
MCHC: 34.1 g/dL (ref 30.0–36.0)
MCV: 91.6 fL (ref 80.0–100.0)
Platelets: 70 10*3/uL — ABNORMAL LOW (ref 150–400)
RBC: 2.98 MIL/uL — ABNORMAL LOW (ref 3.87–5.11)
RDW: 18.8 % — ABNORMAL HIGH (ref 11.5–15.5)
WBC: 7.2 10*3/uL (ref 4.0–10.5)
nRBC: 0 % (ref 0.0–0.2)

## 2019-12-09 LAB — BASIC METABOLIC PANEL
Anion gap: 13 (ref 5–15)
BUN: 52 mg/dL — ABNORMAL HIGH (ref 8–23)
CO2: 18 mmol/L — ABNORMAL LOW (ref 22–32)
Calcium: 8 mg/dL — ABNORMAL LOW (ref 8.9–10.3)
Chloride: 102 mmol/L (ref 98–111)
Creatinine, Ser: 4.61 mg/dL — ABNORMAL HIGH (ref 0.44–1.00)
GFR calc Af Amer: 9 mL/min — ABNORMAL LOW (ref >60)
GFR calc non Af Amer: 8 mL/min — ABNORMAL LOW (ref >60)
Glucose, Bld: 161 mg/dL — ABNORMAL HIGH (ref 70–99)
Potassium: 3.6 mmol/L (ref 3.5–5.1)
Sodium: 133 mmol/L — ABNORMAL LOW (ref 135–145)

## 2019-12-09 MED ORDER — GLYCOPYRROLATE 0.2 MG/ML IJ SOLN
0.2000 mg | INTRAMUSCULAR | Status: DC | PRN
  Filled 2019-12-09: qty 1

## 2019-12-09 MED ORDER — POLYVINYL ALCOHOL 1.4 % OP SOLN
1.0000 [drp] | Freq: Four times a day (QID) | OPHTHALMIC | Status: DC | PRN
  Filled 2019-12-09: qty 15

## 2019-12-09 MED ORDER — GLYCOPYRROLATE 1 MG PO TABS
1.0000 mg | ORAL_TABLET | ORAL | Status: DC | PRN
  Filled 2019-12-09: qty 1

## 2019-12-09 NOTE — Progress Notes (Signed)
1        [facility] [place] NAME: [name]    MR#:  [id number]  DATE OF BIRTH:  February 17, 1931  SUBJECTIVE:  CHIEF COMPLAINT:   Chief Complaint  Patient presents with  . GI Bleeding  No further bleeding.  No new complaints, feeling very weak, remains persistently hypotensive.  Moved out of the ICU this morning by ICU team REVIEW OF SYSTEMS:  Review of Systems  Constitutional: Positive for malaise/fatigue. Negative for diaphoresis, fever and weight loss.  HENT: Negative for ear discharge, ear pain, hearing loss, nosebleeds, sore throat and tinnitus.   Eyes: Negative for blurred vision and pain.  Respiratory: Negative for cough, hemoptysis, shortness of breath and wheezing.   Cardiovascular: Negative for chest pain, palpitations, orthopnea and leg swelling.  Gastrointestinal: Negative for abdominal pain, blood in stool, constipation, diarrhea, heartburn, nausea and vomiting.  Genitourinary: Negative for dysuria, frequency and urgency.  Musculoskeletal: Negative for back pain and myalgias.  Skin: Negative for itching and rash.  Neurological: Negative for dizziness, tingling, tremors, focal weakness, seizures, weakness and headaches.  Psychiatric/Behavioral: Negative for depression. The patient is not nervous/anxious.    DRUG ALLERGIES:   Allergies  Allergen Reactions  . Sulfa Antibiotics Shortness Of Breath, Other (See Comments) and Palpitations    dizziness   VITALS:  Blood pressure (!) 80/37, pulse 80, temperature 98 F (36.7 C), temperature source Oral, resp. rate 20, height 5\' 6"  (1.676 m), weight 93.2 kg, SpO2 100 %. PHYSICAL EXAMINATION:  Physical Exam HENT:     Head: Normocephalic and atraumatic.  Eyes:     Conjunctiva/sclera: Conjunctivae normal.     Pupils: Pupils are equal, round, and reactive to light.  Neck:     Thyroid: No thyromegaly.     Trachea: No tracheal deviation.  Cardiovascular:     Rate and Rhythm: Normal rate and regular  rhythm.     Heart sounds: Normal heart sounds.  Pulmonary:     Effort: Pulmonary effort is normal. No respiratory distress.     Breath sounds: Normal breath sounds. No wheezing.  Chest:     Chest wall: No tenderness.  Abdominal:     General: Bowel sounds are normal. There is no distension.     Palpations: Abdomen is soft.     Tenderness: There is no abdominal tenderness.  Musculoskeletal:        General: Normal range of motion.     Cervical back: Normal range of motion and neck supple.  Skin:    General: Skin is warm and dry.     Findings: No rash.  Neurological:     Mental Status: She is alert and oriented to person, place, and time.     Cranial Nerves: No cranial nerve deficit.    LABORATORY PANEL:  Female CBC Recent Labs  Lab 12/09/19 0622  WBC 7.2  HGB 9.3*  HCT 27.3*  PLT 70*   ------------------------------------------------------------------------------------------------------------------ Chemistries  Recent Labs  Lab 12/08/19 0613 12/09/19 0622  NA 135 133*  K 4.3 3.6  CL 107 102  CO2 16* 18*  GLUCOSE 104* 161*  BUN 48* 52*  CREATININE 4.30* 4.61*  CALCIUM 8.2* 8.0*  AST 220*  --   ALT 29  --   ALKPHOS 168*  --   BILITOT 2.4*  --    RADIOLOGY:  No results found. ASSESSMENT AND PLAN:  [name] a 85 y.o.femalewith medical history significant ofhypertension, hyperlipidemia, atrial fibrillation on Eliquis, PAD, CHF  with EF of 40-45%, AAA repair, hernia repair, CKD stage III, thrombocytopenia, liver cirrhosis, admitted for dark bloody diarrhea.  Hypotensive/circulatory shock with history of hypertension Blood pressure remains low, not requiring pressors Continue midodrine   Acute on chronic kidney disease stage IIIA -creatinine at 4.6 (4.3) Due to ATN from hypotension.  Kidney function continues to get worse Chronic kidney disease secondary to renal vascular disease and hypertension.  Renal ultrasound showing no obstruction -Nephrology  following.  Not a good candidate for dialysis - bicarb infusion per nephrology  Bloody diarrhea and anemia due to blood loss Portal hypertensive gastropathy. History of cirrhosis. Status post EGD on 12/23 showing small hiatal hernia, Portal hypertensive gastropathy. -No obvious source of bleeding.  Per Dr. Annabell Sabal discussion with patient's daughter they wish not to do anything invasive at this time since  - she does not appear to have any further bleeding. - PPI will continue orally. Transfuse for hemoglobin less than 7.  Chronic combined systolic (congestive) and diastolic (congestive) heart failure (HCC): 2D echo on 09/02/2018 showed EF of 40-45% with grade 1 diastolic dysfunction.  Patient has 2+ leg edema, elevated BNP 2407.  -Diuretics is on hold (Lasix and spironolactone) due to worsening renal function.  Atrial fibrillation with controlled ventricular response (HCC): -hold Eliquis -Hold coreg due to hypotension  Acute on chronic thrombocytopenia (HCC): Likely due to liver cirrhosis.  Platelets 80->68->70 -Monitor CBC  HLD (hyperlipidemia): -lipitor  Liver cirrhosis (HCC): ALP 221, AST 200, ALT 35, total bilirubin 2.5.  Mental status okay. Monitor    Patient looks critically sick with multiorgan failure and high risk for death I had discussion with patient's daughter and son over phone.  They are in agreement to keep her comfortable and choose hospice at home if she qualifies.  I have requested TOC team to screen her for hospice.  I do believe she is very appropriate for hospice at home considering multiorgan failure and advanced age.  Her kidney function is continuing to get worse and she is persistently hypotensive  she is appropriate for hospice  All the records are reviewed and case discussed with Care Management/Social Worker. Management plans discussed with the patient, son and daughter over phone and they are in agreement.  CODE STATUS: DNR  TOTAL TIME TAKING  CARE OF THIS PATIENT: 35 minutes.   More than 50% of the time was spent in counseling/coordination of care: YES  POSSIBLE D/C IN 1-2 DAYS, DEPENDING ON CLINICAL CONDITION.   Delfino Lovett M.D on 12/09/2019 at 3:09 PM  Between 7am to 6pm - Pager - [phone]  After 6pm go to [email or web address] - password [email or web address]  [facility]   CC: Primary care physician; Guy Begin, MD  Note: This dictation was prepared with Dragon dictation along with smaller phrase technology. Any transcriptional errors that result from this process are unintentional.

## 2019-12-09 NOTE — Progress Notes (Signed)
[facility]  ROUNDING NOTE   Subjective:   UOP 178.   Confused but pleasant.   Objective:  Vital signs in last 24 hours:  Temp:  [98.1 F (36.7 C)-98.3 F (36.8 C)] 98.3 F (36.8 C) (12/26 0700) Pulse Rate:  [54-81] 81 (12/26 0900) Resp:  [11-19] 15 (12/26 0900) BP: (61-130)/(37-97) 90/61 (12/26 0900) SpO2:  [97 %-100 %] 97 % (12/26 0900)  Weight change:  Filed Weights   12/07/19 0524 12/07/19 1256 12/08/19 0500  Weight: 96.9 kg 95 kg 93.2 kg    Intake/Output: I/O last 3 completed shifts: In: 904.8 [I.V.:904.8] Out: 178 [Urine:178]   Intake/Output this shift:  No intake/output data recorded.  Physical Exam: General: NAD,   Head: Normocephalic, atraumatic. Moist oral mucosal membranes  Eyes: Anicteric, PERRL  Neck: Supple, trachea midline  Lungs:  Clear to auscultation  Heart: Regular rate and rhythm  Abdomen:  Soft, nontender,   Extremities: No peripheral edema.  Neurologic: Alert to self and place  Skin: No lesions        Basic Metabolic Panel: Recent Labs  Lab 12/05/19 0953 12/06/19 0316 12/07/19 1507 12/08/19 0613 12/09/19 0622  NA 131* 132* 135 135 133*  K 3.7 4.1 4.3 4.3 3.6  CL 102 106 107 107 102  CO2 16* 17* 15* 16* 18*  GLUCOSE 77 62* 89 104* 161*  BUN 38* 42* 45* 48* 52*  CREATININE 3.52* 3.50* 3.78* 4.30* 4.61*  CALCIUM 8.7* 8.2* 8.1* 8.2* 8.0*    Liver Function Tests: Recent Labs  Lab 12/05/19 0953 12/08/19 0613  AST 200* 220*  ALT 35 29  ALKPHOS 221* 168*  BILITOT 2.5* 2.4*  PROT 5.7* 4.7*  ALBUMIN 2.9* 2.4*   No results for input(s): LIPASE, AMYLASE in the last 168 hours. Recent Labs  Lab 12/05/19 2152  AMMONIA 28    CBC: Recent Labs  Lab 12/05/19 0953 12/07/19 0939 12/07/19 1338 12/07/19 1507 12/08/19 0613 12/09/19 0622  WBC 5.8 5.0 6.9 5.7 5.6 7.2  NEUTROABS 4.1  --   --   --   --   --   HGB 10.5* 8.9* 9.8* 10.0* 9.0* 9.3*  HCT 31.3* 26.4* 29.8* 30.7* 26.0* 27.3*  MCV 89.7 92.6 94.3 93.9  89.3 91.6  PLT 80* 77* 85* 74* 68* 70*    Cardiac Enzymes: No results for input(s): CKTOTAL, CKMB, CKMBINDEX, TROPONINI in the last 168 hours.  BNP: Invalid input(s): POCBNP  CBG: Recent Labs  Lab 12/06/19 0818 12/06/19 0908 12/07/19 0746 12/07/19 1253 12/08/19 0800  GLUCAP 51* 105* 78 75 96    Microbiology: Results for orders placed or performed during the hospital encounter of 12/05/19  C difficile quick scan w PCR reflex     Status: None   Collection Time: 12/05/19 10:14 AM   Specimen: Stool  Result Value Ref Range Status   C Diff antigen NEGATIVE NEGATIVE Final   C Diff toxin NEGATIVE NEGATIVE Final   C Diff interpretation No C. difficile detected.  Final    Comment: Performed at [facility], [address] [place], [place] [address]  SARS CORONAVIRUS 2 (TAT 6-24 HRS) Nasopharyngeal Nasopharyngeal Swab     Status: None   Collection Time: 12/05/19 11:14 AM   Specimen: Nasopharyngeal Swab  Result Value Ref Range Status   SARS Coronavirus 2 NEGATIVE NEGATIVE Final    Comment: (NOTE) SARS-CoV-2 target nucleic acids are NOT DETECTED. The SARS-CoV-2 RNA is generally detectable in upper and lower respiratory specimens during the acute phase  of infection. Negative results do not preclude SARS-CoV-2 infection, do not rule out co-infections with other pathogens, and should not be used as the sole basis for treatment or other patient management decisions. Negative results must be combined with clinical observations, patient history, and epidemiological information. The expected result is Negative. Fact Sheet for Patients: SugarRoll.be Fact Sheet for Healthcare Providers: https://www.woods-mathews.com/ This test is not yet approved or cleared by the [place] FDA and  has been authorized for detection and/or diagnosis of SARS-CoV-2 by FDA under an Emergency Use Authorization (EUA). This EUA will remain  in effect  (meaning this test can be used) for the duration of the COVID-19 declaration under Section 56 4(b)(1) of the Act, 21 U.S.C. section 360bbb-3(b)(1), unless the authorization is terminated or revoked sooner. Performed at [facility], [place] [address] [place], [place] [address]   MRSA PCR Screening     Status: None   Collection Time: 12/07/19 12:46 PM   Specimen: Nasopharyngeal  Result Value Ref Range Status   MRSA by PCR NEGATIVE NEGATIVE Final    Comment:        The GeneXpert MRSA Assay (FDA approved for NASAL specimens only), is one component of a comprehensive MRSA colonization surveillance program. It is not intended to diagnose MRSA infection nor to guide or monitor treatment for MRSA infections. Performed at [facility], [address] [place], [place] [address]     Coagulation Studies: No results for input(s): LABPROT, INR in the last 72 hours.  Urinalysis: Recent Labs    12/08/19 1028  COLORURINE AMBER*  LABSPEC 1.015  PHURINE 5.0  GLUCOSEU NEGATIVE  HGBUR SMALL*  BILIRUBINUR NEGATIVE  KETONESUR NEGATIVE  PROTEINUR 30*  NITRITE NEGATIVE  LEUKOCYTESUR MODERATE*      Imaging: US RENAL  Result Date: 12/08/2019 CLINICAL DATA:  Acute kidney failure. EXAM: RENAL / URINARY TRACT ULTRASOUND COMPLETE COMPARISON:  CT scan of the abdomen and pelvis dated 07/15/2015 FINDINGS: Right Kidney: Renal measurements: 8.6 x 4.3 x 4.6 cm = volume: 91 mL. Increased echogenicity of the renal parenchyma suggesting renal medical disease. No mass or hydronephrosis visualized. Left Kidney: Renal measurements: 9.4 x 3.9 x 4.3 cm = volume: 85 mL. Increased echogenicity of the renal parenchyma consistent with renal medical disease no mass or hydronephrosis visualized. Bladder: The bladder is empty with a Foley catheter in place. Other: Ascites. Coarse nodular appearance of the liver consistent with the patient's history of cirrhosis IMPRESSION: Echogenic renal parenchyma  consistent with renal medical disease. No renal obstruction. Electronically Signed   By: Lorriane Shire M.D.   On: 12/08/2019 11:31     Medications:   . sodium bicarbonate 150 mEq in dextrose 5% 1000 mL 150 mEq (12/09/19 0124)  . sodium chloride Stopped (12/07/19 1406)   . atorvastatin  20 mg Oral q1800  . Chlorhexidine Gluconate Cloth  6 each Topical Daily  . cholecalciferol  1,000 Units Oral Daily  . influenza vaccine adjuvanted  0.5 mL Intramuscular Tomorrow-1000  . midodrine  10 mg Oral TID WC  . pantoprazole  40 mg Oral BID AC   albuterol, budesonide, ondansetron (ZOFRAN) IV  Assessment/ Plan:  [name] is a 85 y.o. black female with atrial fibrillation on eliquis, hypertension, congestive heart failure, hyperlipidemia, peripheral arterial disease, AAA status post repair, hepatic cirrhosis,  who was admitted to [facility] on 12/05/2019 for GI bleed and hypotension  1. Acute renal failure on chronic kidney disease stage IIIA with history of hematuria: baseline  creatinine of 1.05, GFR of 55 in 06/2018.  Chronic kidney disease secondary to renal vascular disease and hypertension.  Acute renal failure secondary to ATN from GI bleed, anemia hypotension and prerenal azotemia No IV contrast exposure.  - holding lisinopril, spironolactone, furosemide and potassium chloride.  - Anuric to oliguric. However not a candidate for dialysis due to baseline dementia. - Continue bicarb infusion  2. Hypotension: not requiring vasopressors. Secondary to GI bleed and hypotensive shock.  Home blood pressure regimen of lisinopril, spironolactone, furosemide and carvedilol. All on hold.  - midodrine  3. Anemia with renal failure and GI bleed: no indication for blood transfusion Endoscopy on 12/23 by Dr. Servando SnareWohl revealed portal gastropathy consistent with hepatic cirrhosis.  With thrombocytopenia.   LOS: 4 Kjuan Seipp 12/26/20209:45 AM

## 2019-12-09 NOTE — Progress Notes (Signed)
[name], MD [facility]   [address], [name], [place] [address] Phone: [phone] Fax : [phone]   Subjective: The patient has had no further sign of any GI bleeding.  The patient's hemoglobin has been stable.  The EGD did show hypertensive portal gastropathy.  The patient continues to have a low blood pressure which I believe is her baseline.   Objective: Vital signs in last 24 hours: Vitals:   12/09/19 0700 12/09/19 0800 12/09/19 0900 12/09/19 1017  BP: (!) 84/55 (!) 79/57 90/61 (!) 80/37  Pulse: 80 79 81 80  Resp: 13 18 15 20   Temp: 98.3 F (36.8 C)   98 F (36.7 C)  TempSrc:    Oral  SpO2: 99% 99% 97% 100%  Weight:      Height:       Weight change:   Intake/Output Summary (Last 24 hours) at 12/09/2019 1332 Last data filed at 12/09/2019 0900 Gross per 24 hour  Intake 842.07 ml  Output 128 ml  Net 714.07 ml     Exam: Heart:: Regular rate and rhythm, S1S2 present or without murmur or extra heart sounds Lungs: normal and clear to auscultation and percussion Abdomen: soft, nontender, normal bowel sounds   Lab Results: @LABTEST2 @ Micro Results: Recent Results (from the past 240 hour(s))  C difficile quick scan w PCR reflex     Status: None   Collection Time: 12/05/19 10:14 AM   Specimen: Stool  Result Value Ref Range Status   C Diff antigen NEGATIVE NEGATIVE Final   C Diff toxin NEGATIVE NEGATIVE Final   C Diff interpretation No C. difficile detected.  Final    Comment: Performed at [facility], [place], [place] [address]  SARS CORONAVIRUS 2 (TAT 6-24 HRS) Nasopharyngeal Nasopharyngeal Swab     Status: None   Collection Time: 12/05/19 11:14 AM   Specimen: Nasopharyngeal Swab  Result Value Ref Range Status   SARS Coronavirus 2 NEGATIVE NEGATIVE Final    Comment: (NOTE) SARS-CoV-2 target nucleic acids are NOT DETECTED. The SARS-CoV-2 RNA is generally detectable in upper and lower respiratory specimens during the  acute phase of infection. Negative results do not preclude SARS-CoV-2 infection, do not rule out co-infections with other pathogens, and should not be used as the sole basis for treatment or other patient management decisions. Negative results must be combined with clinical observations, patient history, and epidemiological information. The expected result is Negative. Fact Sheet for Patients: SugarRoll.be Fact Sheet for Healthcare Providers: https://www.woods-mathews.com/ This test is not yet approved or cleared by the [place] FDA and  has been authorized for detection and/or diagnosis of SARS-CoV-2 by FDA under an Emergency Use Authorization (EUA). This EUA will remain  in effect (meaning this test can be used) for the duration of the COVID-19 declaration under Section 56 4(b)(1) of the Act, 21 U.S.C. section 360bbb-3(b)(1), unless the authorization is terminated or revoked sooner. Performed at [facility], [place] [address], [place], [place] [address]   MRSA PCR Screening     Status: None   Collection Time: 12/07/19 12:46 PM   Specimen: Nasopharyngeal  Result Value Ref Range Status   MRSA by PCR NEGATIVE NEGATIVE Final    Comment:        The GeneXpert MRSA Assay (FDA approved for NASAL specimens only), is one component of a comprehensive MRSA colonization surveillance program. It is not intended to diagnose MRSA infection nor to guide or monitor treatment for MRSA infections. Performed  at [facility], [address] [place] [place], [place] [address]    Studies/Results: US RENAL  Result Date: 12/08/2019 CLINICAL DATA:  Acute kidney failure. EXAM: RENAL / URINARY TRACT ULTRASOUND COMPLETE COMPARISON:  CT scan of the abdomen and pelvis dated 07/15/2015 FINDINGS: Right Kidney: Renal measurements: 8.6 x 4.3 x 4.6 cm = volume: 91 mL. Increased echogenicity of the renal parenchyma suggesting renal medical disease. No mass  or hydronephrosis visualized. Left Kidney: Renal measurements: 9.4 x 3.9 x 4.3 cm = volume: 85 mL. Increased echogenicity of the renal parenchyma consistent with renal medical disease no mass or hydronephrosis visualized. Bladder: The bladder is empty with a Foley catheter in place. Other: Ascites. Coarse nodular appearance of the liver consistent with the patient's history of cirrhosis IMPRESSION: Echogenic renal parenchyma consistent with renal medical disease. No renal obstruction. Electronically Signed   By: Francene Boyers M.D.   On: 12/08/2019 11:31   Medications: I have reviewed the patient's current medications. Scheduled Meds: . atorvastatin  20 mg Oral q1800  . Chlorhexidine Gluconate Cloth  6 each Topical Daily  . cholecalciferol  1,000 Units Oral Daily  . influenza vaccine adjuvanted  0.5 mL Intramuscular Tomorrow-1000  . midodrine  10 mg Oral TID WC  . pantoprazole  40 mg Oral BID AC   Continuous Infusions: . sodium bicarbonate 150 mEq in dextrose 5% 1000 mL 150 mEq (12/09/19 0124)  . sodium chloride Stopped (12/07/19 1406)   PRN Meds:.albuterol, budesonide, ondansetron (ZOFRAN) IV   Assessment: Principal Problem:   Bloody diarrhea Active Problems:   Chronic combined systolic (congestive) and diastolic (congestive) heart failure (HCC)   HTN (hypertension)   Atrial fibrillation with controlled ventricular response (HCC)   Thrombocytopenia (HCC)   Acute renal failure superimposed on stage 3a chronic kidney disease (HCC)   HLD (hyperlipidemia)   Liver cirrhosis (HCC)   Anemia due to blood loss   DNR (do not resuscitate)   Goals of care, counseling/discussion   Palliative care by specialist    Plan: This patient came in with bloody diarrhea and has had no further sign of any GI bleeding.  The patient's upper endoscopy showed hypertensive portal gastropathy consistent with the patient cirrhosis.  I do not believe that this patient is in any condition to proceed with any  further GI interventions.    I will sign off.  Please call if any further GI concerns or questions.  We would like to thank you for the opportunity to participate in the care of [name].     LOS: 4 days   Kavin Weckwerth 12/09/2019, 1:32 PM Pager [phone] 7am-5pm  Check AMION for 5pm -7am coverage and on weekends

## 2019-12-09 NOTE — Progress Notes (Signed)
Patient alert with intermit confusion. No complaint's of shortness of breath or pain. Per Dr Mortimer Fries MAP goal of 50. Patient had a loose stool, no signs of blood. IV team placed new IV to right arm. Dr Abigail Butts and Mortimer Fries notified of patient light chocolate/tan urine. He will review and assess patient. Patient being transferred to floor. Report given to [facility].

## 2019-12-09 NOTE — TOC Progression Note (Signed)
Transition of Care [facility]) - Progression Note    Patient Details  Name: [name] MRN: [id number] Date of Birth: July 11, 1931  Transition of Care [facility]) CM/SW Contact  Marshell Garfinkel, RN Phone Number: 12/09/2019, 3:21 PM  Clinical Narrative:    RNCM received message from MD requesting hospice referral. I spoke with both children by phone [phone] (they prefer calls to [phone]). They would like to arrange for hospice at home through [facility]- referral called to hospice intake. They are requesting a hospital bed also. TOC to follow.    Expected Discharge Plan: [place]    Expected Discharge Plan and Services Expected Discharge Plan: [place] Determinants of Health (SDOH) Interventions    Readmission Risk Interventions No flowsheet data found.

## 2019-12-10 DIAGNOSIS — E860 Dehydration: Secondary | ICD-10-CM

## 2019-12-10 LAB — BASIC METABOLIC PANEL
Anion gap: 13 (ref 5–15)
BUN: 55 mg/dL — ABNORMAL HIGH (ref 8–23)
CO2: 18 mmol/L — ABNORMAL LOW (ref 22–32)
Calcium: 8.1 mg/dL — ABNORMAL LOW (ref 8.9–10.3)
Chloride: 103 mmol/L (ref 98–111)
Creatinine, Ser: 4.94 mg/dL — ABNORMAL HIGH (ref 0.44–1.00)
GFR calc Af Amer: 9 mL/min — ABNORMAL LOW (ref >60)
GFR calc non Af Amer: 7 mL/min — ABNORMAL LOW (ref >60)
Glucose, Bld: 110 mg/dL — ABNORMAL HIGH (ref 70–99)
Potassium: 3.4 mmol/L — ABNORMAL LOW (ref 3.5–5.1)
Sodium: 134 mmol/L — ABNORMAL LOW (ref 135–145)

## 2019-12-10 MED ORDER — MORPHINE SULFATE (PF) 2 MG/ML IV SOLN: 1.0000 mg | INTRAVENOUS | Status: DC | PRN

## 2019-12-10 MED ORDER — SODIUM CHLORIDE 0.9% FLUSH: 3.0000 mL | INTRAVENOUS | Status: DC | PRN

## 2019-12-10 MED ORDER — SODIUM CHLORIDE 0.9% FLUSH
3.0000 mL | Freq: Two times a day (BID) | INTRAVENOUS | Status: DC
  Administered 2019-12-10: 3 mL via INTRAVENOUS

## 2019-12-10 MED ORDER — HALOPERIDOL LACTATE 5 MG/ML IJ SOLN: 2.5000 mg | INTRAMUSCULAR | Status: DC | PRN

## 2019-12-10 MED ORDER — MORPHINE SULFATE (CONCENTRATE) 10 MG /0.5 ML PO SOLN: 5.0000 mg | ORAL | 0 refills | Status: AC | PRN

## 2019-12-10 NOTE — Progress Notes (Signed)
1        [place] [facility] [place] NAME: [name]    MR#:  [id number]  DATE OF BIRTH:  [birth year]  SUBJECTIVE:  CHIEF COMPLAINT:   Chief Complaint  Patient presents with  . GI Bleeding  Hypotensive, remains pleasantly confused REVIEW OF SYSTEMS:  Review of Systems  Constitutional: Positive for malaise/fatigue. Negative for diaphoresis, fever and weight loss.  HENT: Negative for ear discharge, ear pain, hearing loss, nosebleeds, sore throat and tinnitus.   Eyes: Negative for blurred vision and pain.  Respiratory: Negative for cough, hemoptysis, shortness of breath and wheezing.   Cardiovascular: Negative for chest pain, palpitations, orthopnea and leg swelling.  Gastrointestinal: Negative for abdominal pain, blood in stool, constipation, diarrhea, heartburn, nausea and vomiting.  Genitourinary: Negative for dysuria, frequency and urgency.  Musculoskeletal: Negative for back pain and myalgias.  Skin: Negative for itching and rash.  Neurological: Negative for dizziness, tingling, tremors, focal weakness, seizures, weakness and headaches.  Psychiatric/Behavioral: Negative for depression. The patient is not nervous/anxious.    DRUG ALLERGIES:   Allergies  Allergen Reactions  . Sulfa Antibiotics Shortness Of Breath, Other (See Comments) and Palpitations    dizziness   VITALS:  Blood pressure (!) 90/48, pulse 82, temperature 98 F (36.7 C), temperature source Oral, resp. rate 17, height 5\' 6"  (1.676 m), weight 93.2 kg, SpO2 99 %. PHYSICAL EXAMINATION:  Physical Exam HENT:     Head: Normocephalic and atraumatic.  Eyes:     Conjunctiva/sclera: Conjunctivae normal.     Pupils: Pupils are equal, round, and reactive to light.  Neck:     Thyroid: No thyromegaly.     Trachea: No tracheal deviation.  Cardiovascular:     Rate and Rhythm: Normal rate and regular rhythm.     Heart sounds: Normal heart sounds.  Pulmonary:     Effort: Pulmonary effort is normal.  No respiratory distress.     Breath sounds: Normal breath sounds. No wheezing.  Chest:     Chest wall: No tenderness.  Abdominal:     General: Bowel sounds are normal. There is no distension.     Palpations: Abdomen is soft.     Tenderness: There is no abdominal tenderness.  Musculoskeletal:        General: Normal range of motion.     Cervical back: Normal range of motion and neck supple.  Skin:    General: Skin is warm and dry.     Findings: No rash.  Neurological:     Mental Status: She is confused.     Cranial Nerves: No cranial nerve deficit.    LABORATORY PANEL:  Female CBC Recent Labs  Lab 12/09/19 0622  WBC 7.2  HGB 9.3*  HCT 27.3*  PLT 70*   ------------------------------------------------------------------------------------------------------------------ Chemistries  Recent Labs  Lab 12/08/19 0613 12/10/19 0842  NA 135 134*  K 4.3 3.4*  CL 107 103  CO2 16* 18*  GLUCOSE 104* 110*  BUN 48* 55*  CREATININE 4.30* 4.94*  CALCIUM 8.2* 8.1*  AST 220*  --   ALT 29  --   ALKPHOS 168*  --   BILITOT 2.4*  --    RADIOLOGY:  No results found. ASSESSMENT AND PLAN:  Jordan Streed Fulleris a 85 y.o.femalewith medical history significant ofhypertension, hyperlipidemia, atrial fibrillation on Eliquis, PAD, CHF with EF of 40-45%, AAA repair, hernia repair, CKD stage III, thrombocytopenia, liver cirrhosis, admitted for dark bloody diarrhea.  Hypotensive/circulatory shock with history of hypertension  Acute on chronic kidney disease stage IIIA -creatinine at 4.9  Due to ATN from hypotension.  Kidney function continues to get worse  Bloody diarrhea and anemia due to blood loss Portal hypertensive gastropathy. History of cirrhosis. Status post EGD on 12/23 showing small hiatal hernia, Portal hypertensive gastropathy. -No obvious source of bleeding.  Per Dr. Annabell Sabal discussion with patient's daughter they wish not to do anything invasive at this time since   Chronic  combined systolic (congestive) and diastolic (congestive) heart failure (HCC): 2D echo on 09/02/2018 showed EF of 40-45% with grade 1 diastolic dysfunction.  Patient has 2+ leg edema, elevated BNP 2407.  -Diuretics is on hold (Lasix and spironolactone) due to worsening renal function.  Atrial fibrillation with controlled ventricular response (HCC): -hold Eliquis -Hold coreg due to hypotension  Acute on chronic thrombocytopenia (HCC): Likely due to liver cirrhosis.  Platelets 80->68->70 -Monitor CBC  HLD (hyperlipidemia): -lipitor  Liver cirrhosis (HCC): ALP 221, AST 200, ALT 35, total bilirubin 2.5.  Mental status okay. Monitor    Patient looks critically sick with multiorgan failure and high risk for death I had discussion with patient's daughter and son over phone.  They are in agreement to keep her comfortable and choose hospice at home.  I have requested TOC team to screen her for hospice on 12/26.  I do believe she is very appropriate for hospice at home considering multiorgan failure and advanced age.  Her kidney function is continuing to get worse and she is persistently hypotensive  Awaiting TOC team to update me on hospice set up at home.  She is medically ready for discharge  All the records are reviewed and case discussed with Care Management/Social Worker. Management plans discussed with the patient, son and daughter over phone and they are in agreement.  CODE STATUS: DNR  TOTAL TIME TAKING CARE OF THIS PATIENT: 35 minutes.   More than 50% of the time was spent in counseling/coordination of care: YES  POSSIBLE D/C IN 1 DAYS, DEPENDING ON CLINICAL CONDITION.   Delfino Lovett M.D on 12/10/2019 at 1:47 PM  Between 7am to 6pm - Pager - [phone]  After 6pm go to [email or web address] - password [email or web address]  [facility]   CC: Primary care physician; Guy Begin, MD  Note: This dictation was prepared with Dragon dictation along with smaller phrase technology. Any  transcriptional errors that result from this process are unintentional.

## 2019-12-10 NOTE — Progress Notes (Signed)
Pt alert, pleasantly confused/ denies co's/concerns. Total ADL with care and orders updated for comfort care. Rested quietly with no issues/concerns.

## 2019-12-10 NOTE — TOC Progression Note (Signed)
Transition of Care [facility]) - Progression Note    Patient Details  Name: JNAE THOMASTON MRN: [id number] Date of Birth: [birth year]  Transition [facility] Care [facility]) CM/SW Contact  Kearia Yin [place], [place] Phone Number: 12/10/2019, 3:06 PM  Clinical Narrative:    Patient being discharged home with Hospice Services at home through [facility]. Referral completed, H&P as well as face sheet faxed to April at [facility], [phone]. April confirms that the hospital bed has been ordered and should arrive to patient's home today by 3 pm. This social worker spoke with patient's daughter Patrica Mendell who is agreeable to Hospice Services at home through [facility]. She confirmed that the hospital bed should arrive today. She is agreeable for patient to be transported home by EMS. No further needs identified at this time.   Ruthmary Occhipinti, LCSW Clinical Social Work [phone]    Expected Discharge Plan: [place]    Expected Discharge Plan and Services Expected Discharge Plan: [place]         Expected Discharge Date: 12/10/19                                     Social Determinants of Health (SDOH) Interventions    Readmission Risk Interventions No flowsheet data found.

## 2019-12-10 NOTE — Progress Notes (Signed)
Pt being discharged home with hospice support. TC with dgt, Mickie Bail at [phone] reporting bed now being assembled and will be ready for pt in approximately 20 minutes. Oral AVS instructions given to [facility] and advised of need to pick up rx at [facility] in [place] with stated understanding. Pt prepared for discharge with pt agreeable.

## 2019-12-10 NOTE — Discharge Instructions (Signed)
Bloody Diarrhea Bloody diarrhea is frequent loose and watery bowel movements that contain blood. The blood can be hard to see or notice (occult). Bloody diarrhea may be caused by medical conditions such as:  Ulcerative colitis.  Crohn's disease.  Intestinal infection.  Viral gastroenteritis or bacterial gastroenteritis. Finding out why there is blood in your diarrhea is necessary so that your health care provider can prescribe the right treatment for you. Follow the instructions from your health care provider about treating the cause of your bloody diarrhea. Any type of diarrhea can make you feel weak and dehydrated. Dehydration can make you tired and thirsty, cause you to have a dry mouth, and decrease how often you urinate. Follow these instructions at home: Eating and drinking     Follow these recommendations as told by your health care provider:  Take an oral rehydration solution (ORS). This is an over-the-counter medicine that helps return your body to its normal balance of nutrients and water. It is found at pharmacies and retail stores.  Drink enough fluid to keep your urine pale yellow. ? Drink fluids such as water, ice chips, diluted fruit juice, and low-calorie sports drinks. You can also drink milk products, if desired. ? Avoid drinking fluids that contain a lot of sugar or caffeine, such as energy drinks, regular sports drinks, and soda. ? Avoid alcohol.  Eat bland, easy-to-digest foods in small amounts as you are able. These foods include bananas, applesauce, rice, lean meats, toast, and crackers.  Avoid spicy or fatty foods.  Medicines  Take over-the-counter and prescription medicines only as told by your health care provider. ? Your health care provider may prescribe medicine to slow down the frequency of diarrhea or to ease stomach discomfort.  If you were prescribed an antibiotic medicine, take it as told by your health care provider. Do not stop using the  antibiotic even if you start to feel better. General instructions   Wash your hands often using soap and water. If soap and water are not available, use a hand sanitizer. Others in the household should wash their hands as well. Hands should be washed: ? After using the toilet or changing a diaper. ? Before preparing, cooking, or serving food. ? While caring for a sick person or while visiting someone in a hospital.  Rest at home while you recover.  Take a warm bath to relieve any burning or pain from frequent diarrhea episodes.  Watch your condition for any changes.  Keep all follow-up visits as told by your health care provider. This is important. Contact a health care provider if:  You have a fever.  Your diarrhea gets worse.  You have new symptoms.  You cannot keep fluids down.  You feel light-headed or dizzy.  You have a headache.  You have muscle cramps. Get help right away if:  You have chest pain.  You feel extremely weak or you faint.  The blood in your diarrhea increases or turns a different color.  You vomit and the vomit is bloody or looks black.  You have persistent diarrhea.  You have severe pain, cramping, or bloating in your abdomen.  You have trouble breathing or you are breathing very quickly.  Your heart is beating very quickly.  Your skin feels cold and clammy.  You feel confused.  You have signs of dehydration, such as: ? Dark urine, very little urine, or no urine. ? Cracked lips. ? Dry mouth. ? Sunken eyes. ? Sleepiness. ? Weakness. Summary  Bloody diarrhea is frequent loose and watery bowel movements that contain blood. The blood can be hard to see or notice (occult).  Follow the instructions from your health care provider about treating the cause of your bloody diarrhea.  Any type of diarrhea can make you feel weak and dehydrated.  Follow your health care provider's recommendations for eating and drinking and for taking  medicines.  Contact your health care provider if your symptoms get worse. Get help right away if you have signs of dehydration. This information is not intended to replace advice given to you by your health care provider. Make sure you discuss any questions you have with your health care provider. Document Released: 11/30/2005 Document Revised: 05/12/2018 Document Reviewed: 05/12/2018 Elsevier Patient Education  2020 [employer] is a service that is designed to provide people who are terminally ill and their families with medical, spiritual, and psychological support. Its aim is to improve your quality of life by keeping you as comfortable as possible in the final stages of life. Who will be my providers when I begin hospice care? Hospice teams often include:  A nurse.  A doctor. The hospice doctor will be available for your care, but you can include your regular doctor or nurse practitioner.  A Education officer, museum.  A counselor.  A religious leader (such as a Clinical biochemist).  A dietitian.  Therapists.  Trained volunteers who can help with care. What services does hospice provide? Hospice services can vary depending on the center or organization. Generally, they include:  Ways to keep you comfortable, such as: ? Providing care in your home or in a home-like setting. ? Working with your family and friends to help meet your needs. ? Allowing you to enjoy the support of loved ones by receiving much of your basic care from family and friends.  Pain relief and symptom management. The staff will supply all necessary medicines and equipment so that you can stay comfortable and alert enough to enjoy the company of your friends and family.  Visits or care from a nurse and doctor. This may include 24-hour on-call services.  Companionship when you are alone.  Allowing you and your family to rest. Hospice staff may do light housekeeping, prepare meals, and run  errands.  Counseling. They will make sure your emotional, spiritual, and social needs are being met, as well as those needs of your family members.  Spiritual care. This will be individualized to meet your needs and your family's needs. It may involve: ? Helping you and your family understand the dying process. ? Helping you say goodbye to your family and friends. ? Performing a specific religious ceremony or ritual.  Massage.  Nutrition therapy.  Physical and occupational therapy.  Short-term inpatient care, if something cannot be managed in the home.  Art or music therapy.  Bereavement support for grieving family members. When should hospice care begin? Most people who use hospice are believed to have less than 6 months to live.  Your family and health care providers can help you decide when hospice services should begin.  If you live longer than 6 months but your condition does not improve, your doctor may be able to approve you for continued hospice care.  If your condition improves, you may discontinue the program. What should I consider before selecting a program? Most hospice programs are run by nonprofit, independent organizations. Some are affiliated with hospitals, nursing homes, or home health care agencies. Hospice programs can take place  in your home or at a hospice center, hospital, or skilled nursing facility. When choosing a hospice program, ask the following questions:  What services are available to me?  What services will be offered to my loved ones?  How involved will my loved ones be?  How involved will my health care provider be?  Who makes up the hospice care team? How are they trained or screened?  How will my pain and symptoms be managed?  If my circumstances change, can the services be provided in a different setting, such as my home or in the hospital?  Is the program reviewed and licensed by the state or certified in some other way?  What  does it cost? Is it covered by insurance?  If I choose a hospice center or nursing home, where is the hospice center located? Is it convenient for family and friends?  If I choose a hospice center or nursing home, can my family and friends visit any time?  Will you provide emotional and spiritual support?  Who can my family call with questions? Where can I learn more about hospice? You can learn about existing hospice programs in your area from your health care providers. You can also read more about hospice online. The websites of the following organizations have helpful information:  [facility] Care Organization [facility]): http://www.brown-buchanan.com/  National Association for [facility]): http://massey-hart.com/  Hospice Foundation of [place]): www.hospicefoundation.org  American Cancer Society (ACS): www.cancer.org  Hospice Net: www.hospicenet.org  Visiting Nurse Associations [place] (VNAA): www.vnaa.org You may also find more information by contacting the following agencies:  A local agency on aging.  Your local [employer] chapter.  Your state's department of health or social services. Summary  Hospice is a service that is designed to provide people who are terminally ill and their families with medical, spiritual, and psychological support.  Hospice aims to improve your quality of life by keeping you as comfortable as possible in the final stages of life.  Hospice teams often include a doctor, nurse, social worker, counselor, religious leader,dietitian, therapists, and volunteers.  Hospice care generally includes medicine for symptom management, visits from doctors and nurses, physical and occupational therapy, nutrition counseling, spiritual and emotional counseling, caregiver support, and bereavement support for grieving family members.  Hospice programs can take place in your home or at a hospice center, hospital, or skilled nursing facility. This  information is not intended to replace advice given to you by your health care provider. Make sure you discuss any questions you have with your health care provider. Document Released: 03/18/2004 Document Revised: 11/12/2017 Document Reviewed: 12/22/2016 Elsevier Patient Education  2020 [employer].

## 2019-12-10 NOTE — Progress Notes (Signed)
[facility] here to transport pt home to care of family with hospice support. Pericare and transport preparations completed. Discharge home with hospice/family care.

## 2019-12-10 NOTE — Progress Notes (Signed)
   12/10/19 0900  Clinical Encounter Type  Visited With Patient  Visit Type Initial;Spiritual support;Other (Comment)  Referral From Nurse  Spiritual Encounters  Spiritual Needs Sacred text;Prayer;Emotional;Grief support;Other (Comment)  Stress Factors  Patient Stress Factors Exhausted;Health changes;Loss;Major life changes;Other (Comment)

## 2019-12-11 ENCOUNTER — Encounter: Payer: Self-pay | Admitting: *Deleted

## 2019-12-11 LAB — PROTEIN ELECTROPHORESIS, SERUM
A/G Ratio: 1.1 (ref 0.7–1.7)
Albumin ELP: 2.4 g/dL — ABNORMAL LOW (ref 2.9–4.4)
Alpha-1-Globulin: 0.2 g/dL (ref 0.0–0.4)
Alpha-2-Globulin: 0.4 g/dL (ref 0.4–1.0)
Beta Globulin: 0.9 g/dL (ref 0.7–1.3)
Gamma Globulin: 0.6 g/dL (ref 0.4–1.8)
Globulin, Total: 2.2 g/dL (ref 2.2–3.9)
Total Protein ELP: 4.6 g/dL — ABNORMAL LOW (ref 6.0–8.5)

## 2019-12-11 LAB — PROTEIN ELECTRO, RANDOM URINE
Albumin ELP, Urine: 50.3 %
Alpha-1-Globulin, U: 2.9 %
Alpha-2-Globulin, U: 6 %
Beta Globulin, U: 19 %
Gamma Globulin, U: 21.7 %
Total Protein, Urine: 69.4 mg/dL

## 2019-12-11 LAB — KAPPA/LAMBDA LIGHT CHAINS
Kappa free light chain: 95.2 mg/L — ABNORMAL HIGH (ref 3.3–19.4)
Kappa, lambda light chain ratio: 2.32 — ABNORMAL HIGH (ref 0.26–1.65)
Lambda free light chains: 41.1 mg/L — ABNORMAL HIGH (ref 5.7–26.3)

## 2019-12-11 NOTE — Care Management (Addendum)
Post discharge:Received call from patient's daughter ?Shea Stakes [phone] stating that she called [facility] and was told "patient was not one of their patient's".  I spoke with Rose with [facility] [phone] for clarification of weekend referral. Crystal states she misunderstood patient's daughter and now "she will know where to look for patient".  I have updated patient's daughter that they should receive a callback from Hospice. I asked daughter to call me back if she hasn't received a call by 1500 today. She agreed.

## 2019-12-13 NOTE — Discharge Summary (Signed)
[place] [facility]   [name]    MR#:  [id number]  DATE OF BIRTH:  Apr 11, 1931  DATE OF ADMISSION:  12/05/2019   ADMITTING PHYSICIAN: Lorretta HarpXilin Niu, MD  DATE OF DISCHARGE: 12/10/2019  5:44 PM  PRIMARY CARE PHYSICIAN: [name], Narges, MD   ADMISSION DIAGNOSIS:  Dehydration [E86.0] Bloody diarrhea [R19.7] Acute kidney injury (HCC) [N17.9] Diarrhea, unspecified type [R19.7] DISCHARGE DIAGNOSIS:  Principal Problem:   Diarrhea Active Problems:   Chronic combined systolic (congestive) and diastolic (congestive) heart failure (HCC)   HTN (hypertension)   Atrial fibrillation with controlled ventricular response (HCC)   Thrombocytopenia (HCC)   Acute kidney injury (HCC)   HLD (hyperlipidemia)   Liver cirrhosis (HCC)   Anemia due to blood loss   DNR (do not resuscitate)   Goals of care, counseling/discussion   Palliative care by specialist   Dehydration  SECONDARY DIAGNOSIS:   Past Medical History:  Diagnosis Date  . Arthritis    "maybe a little" (03/24/2017)  . Chronic diastolic CHF (congestive heart failure) (HCC)    a. TTE 4/19: nl LVSF, nl RVSF, mod LVH, mild MR, trivial TR  . Dizziness   . Fall    a. fractured left hip 05/2018 s/p ORIF  . GI bleed   . Hypertension   . PAD (peripheral artery disease) (HCC)   . Persistent atrial fibrillation (HCC)    a. diagnosed 03/2018; b. CHADS2VASc 8 (CHF, HTN, age x 2, stroke x 2, vascular disease, female); c. Eliquis   HOSPITAL COURSE:  [name] a 85 y.o.femalewith medical history significant ofhypertension, hyperlipidemia, atrial fibrillation on Eliquis, PAD, CHF with EF of 40-45%, AAA repair, hernia repair, CKD stage III, thrombocytopenia, liver cirrhosis, admitted for dark bloody diarrhea.  Hypotensive/circulatory shock with history of hypertension   Acute on chronic kidney disease stage IIIA -creatinine at 4.9  Due to ATN from hypotension.not a good HD candidate per  nephro  Bloody diarrheaand anemia due to blood loss Portal hypertensive gastropathy. History of cirrhosis. Status post EGD on 12/23 showing small hiatal hernia, Portal hypertensive gastropathy. -No obvious source of bleeding.  Per Dr. Annabell SabalWohl's discussion with patient's daughter they wish not to do anything invasive at this time since.  Chronic combined systolic (congestive) and diastolic (congestive) heart failure (HCC):2D echo on 09/02/2018 showed EF of 40-45% with grade 1 diastolic dysfunction.   Atrial fibrillation with controlled ventricular response (HCC):  Acute on chronic thrombocytopenia (HCC):Likely due to liver cirrhosis.  HLD (hyperlipidemia): -lipitor  Liver cirrhosis  DISCHARGE CONDITIONS:  fair CONSULTS OBTAINED:  Treatment Team:  Lamont DowdyKolluru, Sarath, MD DRUG ALLERGIES:   Allergies  Allergen Reactions  . Sulfa Antibiotics Shortness Of Breath, Other (See Comments) and Palpitations    dizziness   DISCHARGE MEDICATIONS:   Allergies as of 12/10/2019      Reactions   Sulfa Antibiotics Shortness Of Breath, Other (See Comments), Palpitations   dizziness      Medication List    STOP taking these medications   atorvastatin 20 MG tablet Commonly known as: LIPITOR   budesonide 0.5 MG/2ML nebulizer solution Commonly known as: PULMICORT   carvedilol 25 MG tablet Commonly known as: COREG   cholecalciferol 25 MCG (1000 UT) tablet Commonly known as: VITAMIN D   Eliquis 5 MG Tabs tablet Generic drug: apixaban   furosemide 20 MG tablet Commonly known as: LASIX   lisinopril 2.5 MG tablet Commonly known as: ZESTRIL   potassium chloride SA 20 MEQ tablet Commonly known as:  KLOR-CON   spironolactone 25 MG tablet Commonly known as: ALDACTONE     TAKE these medications   acetaminophen 325 MG tablet Commonly known as: TYLENOL Take 2 tablets (650 mg total) by mouth every 6 (six) hours as needed for mild pain or moderate pain (or Fever >/= 101).    morphine CONCENTRATE 10 mg / 0.5 ml concentrated solution Take 0.25 mLs (5 mg total) by mouth every 3 (three) hours as needed for severe pain, anxiety or shortness of breath.      DISCHARGE INSTRUCTIONS:   DIET:  Regular diet DISCHARGE CONDITION:  Fair ACTIVITY:  Activity as tolerated OXYGEN:  Home Oxygen: No.  Oxygen Delivery: room air DISCHARGE LOCATION:  home with Hospice  If you experience worsening of your admission symptoms, develop shortness of breath, life threatening emergency, suicidal or homicidal thoughts you must seek medical attention immediately by calling 911 or calling your MD immediately  if symptoms less severe.  You Must read complete instructions/literature along with all the possible adverse reactions/side effects for all the Medicines you take and that have been prescribed to you. Take any new Medicines after you have completely understood and accpet all the possible adverse reactions/side effects.   Please note  You were cared for by a hospitalist during your hospital stay. If you have any questions about your discharge medications or the care you received while you were in the hospital after you are discharged, you can call the unit and asked to speak with the hospitalist on call if the hospitalist that took care of you is not available. Once you are discharged, your primary care physician will handle any further medical issues. Please note that NO REFILLS for any discharge medications will be authorized once you are discharged, as it is imperative that you return to your primary care physician (or establish a relationship with a primary care physician if you do not have one) for your aftercare needs so that they can reassess your need for medications and monitor your lab values.    On the day of Discharge:  VITAL SIGNS:  Blood pressure 95/60, pulse 80, temperature 98.6 F (37 C), temperature source Oral, resp. rate 17, height 5\' 6"  (1.676 m), weight 93.8  kg, SpO2 96 %. PHYSICAL EXAMINATION:  GENERAL:  85 y.o.-year-old patient lying in the bed with no acute distress.  EYES: Pupils equal, round, reactive to light and accommodation. No scleral icterus. Extraocular muscles intact.  HEENT: Head atraumatic, normocephalic. Oropharynx and nasopharynx clear.  NECK:  Supple, no jugular venous distention. No thyroid enlargement, no tenderness.  LUNGS: Normal breath sounds bilaterally, no wheezing, rales,rhonchi or crepitation. No use of accessory muscles of respiration.  CARDIOVASCULAR: S1, S2 normal. No murmurs, rubs, or gallops.  ABDOMEN: Soft, non-tender, non-distended. Bowel sounds present. No organomegaly or mass.  EXTREMITIES: No pedal edema, cyanosis, or clubbing.  NEUROLOGIC: Cranial nerves II through XII are intact. Muscle strength 5/5 in all extremities. Sensation intact. Gait not checked.  PSYCHIATRIC: The patient is alert and oriented x 3.  SKIN: No obvious rash, lesion, or ulcer.  DATA REVIEW:   CBC Recent Labs  Lab 12/09/19 0622  WBC 7.2  HGB 9.3*  HCT 27.3*  PLT 70*    Chemistries  Recent Labs  Lab 12/08/19 0613 12/10/19 0842  NA 135 134*  K 4.3 3.4*  CL 107 103  CO2 16* 18*  GLUCOSE 104* 110*  BUN 48* 55*  CREATININE 4.30* 4.94*  CALCIUM 8.2* 8.1*  AST 220*  --  ALT 29  --   ALKPHOS 168*  --   BILITOT 2.4*  --      Follow-up Information    Christoper Fabian, MD. Schedule an appointment as soon as possible for a visit in 1 week(s).   Specialty: Family Medicine Contact information: [address] [address] [phone]        Wellington Hampshire, MD .   Specialty: Cardiology Contact information: [place]  [address] [phone]           Management plans discussed with the patient, family and they are in agreement.  CODE STATUS: Prior   TOTAL TIME TAKING CARE OF THIS PATIENT: 45 minutes.    Max Sane M.D on 12/13/2019 at 8:30 AM  Between 7am to  6pm - Pager [phone]  After 6pm go to [email or web address] - password [email or web address]  [facility]   CC: Primary care physician; Christoper Fabian, MD   Note: This dictation was prepared with Dragon dictation along with smaller phrase technology. Any transcriptional errors that result from this process are unintentional.

## 2020-01-15 DEATH — deceased
